# Patient Record
Sex: Female | Born: 1940 | Race: White | Hispanic: No | Smoking: Never smoker
Health system: Southern US, Community
[De-identification: ages and names within clinical notes are randomized; demographics above are authoritative.]

## PROBLEM LIST (undated history)

## (undated) DIAGNOSIS — K219 Gastro-esophageal reflux disease without esophagitis: Secondary | ICD-10-CM

## (undated) DIAGNOSIS — M21619 Bunion of unspecified foot: Secondary | ICD-10-CM

## (undated) DIAGNOSIS — R413 Other amnesia: Secondary | ICD-10-CM

## (undated) DIAGNOSIS — K644 Residual hemorrhoidal skin tags: Secondary | ICD-10-CM

## (undated) DIAGNOSIS — M810 Age-related osteoporosis without current pathological fracture: Secondary | ICD-10-CM

## (undated) DIAGNOSIS — I10 Essential (primary) hypertension: Secondary | ICD-10-CM

## (undated) DIAGNOSIS — D126 Benign neoplasm of colon, unspecified: Secondary | ICD-10-CM

## (undated) DIAGNOSIS — G43009 Migraine without aura, not intractable, without status migrainosus: Secondary | ICD-10-CM

## (undated) DIAGNOSIS — T7840XA Allergy, unspecified, initial encounter: Secondary | ICD-10-CM

## (undated) DIAGNOSIS — H04129 Dry eye syndrome of unspecified lacrimal gland: Secondary | ICD-10-CM

## (undated) DIAGNOSIS — F329 Major depressive disorder, single episode, unspecified: Secondary | ICD-10-CM

## (undated) DIAGNOSIS — E785 Hyperlipidemia, unspecified: Secondary | ICD-10-CM

## (undated) HISTORY — DX: Other amnesia: R41.3

## (undated) HISTORY — DX: Allergy, unspecified, initial encounter: T78.40XA

## (undated) HISTORY — DX: Benign neoplasm of colon, unspecified: D12.6

## (undated) HISTORY — DX: Age-related osteoporosis without current pathological fracture: M81.0

## (undated) HISTORY — DX: Major depressive disorder, single episode, unspecified: F32.9

## (undated) HISTORY — DX: Bunion of unspecified foot: M21.619

## (undated) HISTORY — DX: Migraine without aura, not intractable, without status migrainosus: G43.009

## (undated) HISTORY — DX: Essential (primary) hypertension: I10

## (undated) HISTORY — DX: Dry eye syndrome of unspecified lacrimal gland: H04.129

## (undated) HISTORY — DX: Gastro-esophageal reflux disease without esophagitis: K21.9

## (undated) HISTORY — DX: Residual hemorrhoidal skin tags: K64.4

## (undated) HISTORY — DX: Hyperlipidemia, unspecified: E78.5

---

## 1978-08-02 HISTORY — PX: VAGINAL HYSTERECTOMY: SUR661

## 1998-02-21 ENCOUNTER — Other Ambulatory Visit: Admission: RE | Admit: 1998-02-21 | Discharge: 1998-02-21 | Payer: Self-pay | Admitting: Family Medicine

## 1998-05-15 ENCOUNTER — Ambulatory Visit (HOSPITAL_COMMUNITY): Admission: RE | Admit: 1998-05-15 | Discharge: 1998-05-15 | Payer: Self-pay | Admitting: Obstetrics & Gynecology

## 1998-06-19 ENCOUNTER — Other Ambulatory Visit: Admission: RE | Admit: 1998-06-19 | Discharge: 1998-06-19 | Payer: Self-pay | Admitting: *Deleted

## 1998-08-02 DIAGNOSIS — K219 Gastro-esophageal reflux disease without esophagitis: Secondary | ICD-10-CM

## 1998-08-02 HISTORY — DX: Gastro-esophageal reflux disease without esophagitis: K21.9

## 1999-03-24 DIAGNOSIS — K644 Residual hemorrhoidal skin tags: Secondary | ICD-10-CM

## 1999-03-24 DIAGNOSIS — G43009 Migraine without aura, not intractable, without status migrainosus: Secondary | ICD-10-CM

## 1999-03-24 DIAGNOSIS — H04129 Dry eye syndrome of unspecified lacrimal gland: Secondary | ICD-10-CM

## 1999-03-24 HISTORY — DX: Residual hemorrhoidal skin tags: K64.4

## 1999-03-24 HISTORY — DX: Migraine without aura, not intractable, without status migrainosus: G43.009

## 1999-03-24 HISTORY — DX: Dry eye syndrome of unspecified lacrimal gland: H04.129

## 2000-05-24 ENCOUNTER — Other Ambulatory Visit: Admission: RE | Admit: 2000-05-24 | Discharge: 2000-05-24 | Payer: Self-pay | Admitting: Internal Medicine

## 2003-04-15 DIAGNOSIS — R413 Other amnesia: Secondary | ICD-10-CM

## 2003-04-15 HISTORY — DX: Other amnesia: R41.3

## 2004-04-15 DIAGNOSIS — M810 Age-related osteoporosis without current pathological fracture: Secondary | ICD-10-CM

## 2004-04-15 HISTORY — DX: Age-related osteoporosis without current pathological fracture: M81.0

## 2004-05-21 ENCOUNTER — Other Ambulatory Visit: Admission: RE | Admit: 2004-05-21 | Discharge: 2004-05-21 | Payer: Self-pay | Admitting: Gynecology

## 2005-08-17 ENCOUNTER — Other Ambulatory Visit: Admission: RE | Admit: 2005-08-17 | Discharge: 2005-08-17 | Payer: Self-pay | Admitting: Gynecology

## 2006-06-29 DIAGNOSIS — E785 Hyperlipidemia, unspecified: Secondary | ICD-10-CM

## 2006-06-29 HISTORY — DX: Hyperlipidemia, unspecified: E78.5

## 2006-08-25 ENCOUNTER — Other Ambulatory Visit: Admission: RE | Admit: 2006-08-25 | Discharge: 2006-08-25 | Payer: Self-pay | Admitting: Gynecology

## 2007-05-05 DIAGNOSIS — F32A Depression, unspecified: Secondary | ICD-10-CM | POA: Insufficient documentation

## 2007-05-05 HISTORY — DX: Depression, unspecified: F32.A

## 2007-06-16 ENCOUNTER — Encounter (INDEPENDENT_AMBULATORY_CARE_PROVIDER_SITE_OTHER): Payer: Self-pay | Admitting: *Deleted

## 2007-06-16 ENCOUNTER — Ambulatory Visit (HOSPITAL_COMMUNITY): Admission: RE | Admit: 2007-06-16 | Discharge: 2007-06-16 | Payer: Self-pay | Admitting: *Deleted

## 2007-06-16 HISTORY — PX: COLONOSCOPY: SHX174

## 2007-11-22 DIAGNOSIS — I10 Essential (primary) hypertension: Secondary | ICD-10-CM

## 2007-11-22 HISTORY — DX: Essential (primary) hypertension: I10

## 2008-08-02 HISTORY — PX: EYE SURGERY: SHX253

## 2009-02-13 LAB — HM DEXA SCAN

## 2009-10-15 ENCOUNTER — Encounter (INDEPENDENT_AMBULATORY_CARE_PROVIDER_SITE_OTHER): Payer: Self-pay | Admitting: *Deleted

## 2010-02-19 ENCOUNTER — Encounter (INDEPENDENT_AMBULATORY_CARE_PROVIDER_SITE_OTHER): Payer: Self-pay | Admitting: *Deleted

## 2010-04-27 ENCOUNTER — Ambulatory Visit: Payer: Self-pay | Admitting: Gastroenterology

## 2010-04-27 DIAGNOSIS — Z860100 Personal history of colon polyps, unspecified: Secondary | ICD-10-CM | POA: Insufficient documentation

## 2010-04-27 DIAGNOSIS — J301 Allergic rhinitis due to pollen: Secondary | ICD-10-CM | POA: Insufficient documentation

## 2010-07-01 DIAGNOSIS — M21619 Bunion of unspecified foot: Secondary | ICD-10-CM

## 2010-07-01 HISTORY — DX: Bunion of unspecified foot: M21.619

## 2010-09-01 NOTE — Letter (Signed)
Summary: New Patient letter  [facility]  [address] [place], [place] [address]   Phone: [phone]  Fax: [phone]       02/19/2010 MRN: [id number]  Caroline Snyder [address] [place], [place]  [address]  Dear Ms. Driggers,  Welcome to the Gastroenterology Division at [facility].    You are scheduled to see Dr. Arlyce Dice on 04/27/2010 at 10:00AM on the 3rd floor at [facility], [address]  We ask that you try to arrive at our office 15 minutes prior to your appointment time to allow for check-in.  We would like you to complete the enclosed self-administered evaluation form prior to your visit and bring it with you on the day of your appointment.  We will review it with you.  Also, please bring a complete list of all your medications or, if you prefer, bring the medication bottles and we will list them.  Please bring your insurance card so that we may make a copy of it.  If your insurance requires a referral to see a specialist, please bring your referral form from your primary care physician.  Co-payments are due at the time of your visit and may be paid by cash, check or credit card.     Your office visit will consist of a consult with your physician (includes a physical exam), any laboratory testing he/she may order, scheduling of any necessary diagnostic testing (e.g. x-ray, ultrasound, CT-scan), and scheduling of a procedure (e.g. Endoscopy, Colonoscopy) if required.  Please allow enough time on your schedule to allow for any/all of these possibilities.    If you cannot keep your appointment, please call [phone] to cancel or reschedule prior to your appointment date.  This allows [place] the opportunity to schedule an appointment for another patient in need of care.  If you do not cancel or reschedule by 5 p.m. the business day prior to your appointment date, you will be charged a $50.00 late cancellation/no-show fee.    Thank you for  choosing [facility] for your medical needs.  We appreciate the opportunity to care for you.  Please visit [place] at our website  to learn more about our practice.                     Sincerely,                                                             The Gastroenterology Division

## 2010-09-01 NOTE — Letter (Signed)
Summary: New Patient letter  [facility]enter[facility]  [address] [place], [place] [address]   Phone: [phone]  Fax: [phone]       10/15/2009 MRN: [id number]  Caroline Snyder [address] [place], [place]  [address]  Dear Caroline Snyder,  Welcome to the Gastroenterology Division at [facility].    You are scheduled to see Dr.  Arlyce Dice on 11/17/2009 at 11:30am on the 3rd floor at [facility], [address].  We ask that you try to arrive at our office 15 minutes prior to your appointment time to allow for check-in.  We would like you to complete the enclosed self-administered evaluation form prior to your visit and bring it with you on the day of your appointment.  We will review it with you.  Also, please bring a complete list of all your medications or, if you prefer, bring the medication bottles and we will list them.  Please bring your insurance card so that we may make a copy of it.  If your insurance requires a referral to see a specialist, please bring your referral form from your primary care physician.  Co-payments are due at the time of your visit and may be paid by cash, check or credit card.     Your office visit will consist of a consult with your physician (includes a physical exam), any laboratory testing he/she may order, scheduling of any necessary diagnostic testing (e.g. x-ray, ultrasound, CT-scan), and scheduling of a procedure (e.g. Endoscopy, Colonoscopy) if required.  Please allow enough time on your schedule to allow for any/all of these possibilities.    If you cannot keep your appointment, please call [phone] to cancel or reschedule prior to your appointment date.  This allows [place] the opportunity to schedule an appointment for another patient in need of care.  If you do not cancel or reschedule by 5 p.m. the business day prior to your appointment date, you will be charged a $50.00 late cancellation/no-show fee.    Thank you for  choosing [facility] for your medical needs.  We appreciate the opportunity to care for you.  Please visit [place] at our website  to learn more about our practice.                     Sincerely,                                                             The Gastroenterology Division

## 2010-09-01 NOTE — Assessment & Plan Note (Signed)
Summary: CLEARING THROAT...AS.   History of Present Illness Visit Type: new patient  Primary GI MD: Melvia Heaps MD [facility] Primary Provider: Lenon Curt. Chilton Si, MD  Requesting Provider: na Chief Complaint: Bloating, GERD, and IBS  History of Present Illness:   [name] is a pleasant 70 year old white female referred at the request of Dr. Chilton Si  because of history of colon polyps.  Screening colonoscopy in 2008 demonstrated an adenomatous polyp.  She has rare pyrosis.  Her main complaint is scratchy throat and sinus headaches.  She is taking Allegra without relief.  She denies dysphagia or persistent pyrosis.   GI Review of Systems    Reports acid reflux, bloating, and  heartburn.      Denies abdominal pain, belching, chest pain, dysphagia with liquids, dysphagia with solids, loss of appetite, nausea, vomiting, vomiting blood, weight loss, and  weight gain.      Reports irritable bowel syndrome.     Denies anal fissure, black tarry stools, change in bowel habit, constipation, diarrhea, diverticulosis, fecal incontinence, heme positive stool, hemorrhoids, jaundice, light color stool, liver problems, rectal bleeding, and  rectal pain.    Current Medications (verified): 1)  Pravastatin Sodium 40 Mg Tabs (Pravastatin Sodium) .... One Tablet By Mouth Once Daily 2)  Nizatidine 150 Mg Caps (Nizatidine) .... One Capsule By Mouth Once Daily As Needed 3)  Fexofenadine Hcl 180 Mg Tabs (Fexofenadine Hcl) .... One Tablet By Mouth Once Daily 4)  Trazodone Hcl 50 Mg Tabs (Trazodone Hcl) .Marland Kitchen.. 1-3 Tablets By Mouth At Bedtime 5)  Citracal/vitamin D 250-200 Mg-Unit Tabs (Calcium Citrate-Vitamin D) .... One Tablet By Mouth Once Daily 6)  Centrum Silver  Tabs (Multiple Vitamins-Minerals) .... One Tablet By Mouth Once Daily 7)  Cinnamon 500 Mg Tabs (Cinnamon) .... One By Mouth Two Times A Day 8)  Fish Oil Maximum Strength 1200 Mg Caps (Omega-3 Fatty Acids) .... One Capsule By Mouth Once Daily 9)  Vitamin D3  1000 Unit Caps (Cholecalciferol) .... One Capsule By Mouth Once Daily 10)  Vitamin B-12 1000 Mcg Tabs (Cyanocobalamin) .... One Tablet By Mouth Once Daily 11)  Vitamin E 400 Unit Caps (Vitamin E) .... One Capsule By Mouth Once Daily 12)  Bayer Low Strength 81 Mg Tbec (Aspirin) .... One Capsule By Mouth Once Daily 13)  Rolaids 550-110 Mg Chew (Ca Carbonate-Mag Hydroxide) .... As Needed 14)  Gas-X 80 Mg Chew (Simethicone) .... 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  Alcohol Use - no Daily Caffeine Use: 3 cups of coffee daily  Illicit Drug Use - no Smoking Status:  never Drug Use:  no  Review of Systems       The patient complains of allergy/sinus, back pain, headaches-new, night sweats, sleeping problems, sore throat, urination - excessive, urine leakage, and voice change.  The patient denies anemia, anxiety-new, arthritis/joint pain, blood in urine, breast changes/lumps, change in vision, confusion, cough, coughing up blood, depression-new, fainting, fatigue, fever, hearing problems, heart murmur,  heart rhythm changes, itching, menstrual pain, muscle pains/cramps, nosebleeds, pregnancy symptoms, shortness of breath, skin rash, swelling of feet/legs, swollen lymph glands, thirst - excessive , urination - excessive , urination changes/pain, and vision changes.          All other systems were reviewed and were negative   Vital Signs:  Patient profile:   70 year old female Height:      60 inches Weight:      106 pounds BMI:     20.78 BSA:     1.43 Pulse rate:   88 / minute Pulse rhythm:   regular BP sitting:   122 / 60  (left arm) Cuff size:   regular  Vitals Entered By: Ok Anis CMA (April 27, 2010 10:19 AM)  Physical Exam  Additional Exam:  She is a well-developed well-nourished female  skin: anicteric HEENT: normocephalic; PEERLA; no nasal or pharyngeal abnormalities neck: supple nodes: no cervical lymphadenopathy chest: clear to ausculatation and percussion heart: no murmurs, gallops, or rubs abd: soft, nontender; BS normoactive; no abdominal masses, tenderness, organomegaly rectal: deferred ext: no cynanosis, clubbing, edema skeletal: no deformities neuro: oriented x 3; no focal abnormalities    Impression & Recommendations:  Problem # 1:  PERSONAL HISTORY OF COLONIC POLYPS (ICD-V12.72) Plan followup colonoscopy in 2013  Problem # 2:  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 Chilton Si, MD  2)  Call back as needed  3)  The medication list was reviewed and reconciled.  All changed / newly prescribed medications were explained.  A complete medication list was provided to the patient / caregiver. Prescriptions: ZYRTEC ALLERGY 10 MG CAPS (CETIRIZINE HCL) take one tab daily as needed  #15 x 2   Entered and Authorized by:   Louis Meckel MD   Signed by:   Louis Meckel MD on 04/27/2010   Method used:   Electronically to        [facility] [address] [id number] (retail)       [address] [place], [place]  [address]       Ph: [phone]       Fax: [phone]   RxID:   [id number]

## 2010-09-01 NOTE — Letter (Signed)
Summary: Results Letter   Gastroenterology  [address] [place], [place] [address]   Phone: [phone]  Fax: [phone]        April 27, 2010 MRN: [id number]    WANZA SZUMSKI [address] [place], [place]  [address]    Dear Ms. Counsell,  It is my pleasure to have treated you recently as a new patient in my office. I appreciate your confidence and the opportunity to participate in your care.  Since I do have a busy inpatient endoscopy schedule and office schedule, my office hours vary weekly. I am, however, available for emergency calls everyday through my office. If I am not available for an urgent office appointment, another one of our gastroenterologist will be able to assist you.  My well-trained staff are prepared to help you at all times. For emergencies after office hours, a physician from our Gastroenterology section is always available through my 24 hour answering service  Once again I welcome you as a new patient and I look forward to a happy and healthy relationship             Sincerely,  Louis Meckel MD  This letter has been electronically signed by your physician.  Appended Document: Results Letter letter mailed

## 2010-09-01 NOTE — Op Note (Signed)
Summary: colonoscopy (Dr Virginia Rochester)  NAME:  Caroline Snyder, Caroline Snyder            ACCOUNT NO.:  [id number]      MEDICAL RECORD NO.:  [id number]          PATIENT TYPE:  AMB      LOCATION:  ENDO                         FACILITY:  [facility]      PHYSICIAN:  Georgiana Spinner, M.D.    DATE OF BIRTH:  12-23-1940      DATE OF PROCEDURE:  06/16/2007   DATE OF DISCHARGE:                                  OPERATIVE REPORT      PROCEDURE:  Colonoscopy.      ENDOSCOPIST:  Georgiana Spinner, M.D.      INDICATIONS:  Colon cancer screening, rectal bleeding.      ANESTHESIA:  Fentanyl 100 mcg, Versed 10 mg.      PROCEDURE:  Before administration of sedation, the patient was placed in   the left lateral decubitus position, and a rectal examination was   performed and she had a fairly poor rectal tone.  Subsequently, the   patient was sedated and the Pentax videoscopic colonoscope was inserted   in the rectum and passed through a tortuous sigmoid colon to reach the   cecum with pressure applied.  The cecum was identified by ileocecal   valve and appendiceal orifice, both of which were photographed.  From   this point, the colonoscope was slowly withdrawn, taking circumferential   views of the colonic mucosa, stopping at approximately 25 cm from anal   verge, at which point a polyp was seen, photographed and removed using   hot-biopsy-forceps technique, setting of 20/150 blended current.  The   endoscope was withdrawn to the rectum, which appeared normal on direct   and showed hemorrhoids on retroflexed view.  The endoscope was   straightened and withdrawn.  The patient's vital signs and pulse   oximetry remained stable.  The patient tolerated the procedure well   without apparent complications.      FINDINGS:   1. Internal hemorrhoids.   2. Laxity of the anal sphincter.   3. Tortuous sigmoid colon.   4. Polyp at 25 cm from anal verge.   5. Otherwise, an unremarkable exam.      PLAN:  Await biopsy report; the patient  will call me for results and   follow up with me as needed as an outpatient.                  ______________________________   Georgiana Spinner, M.D.            GMO/MEDQ  D:  06/16/2007  T:  06/17/2007  Job:  [id number]      cc:   Lenon Curt. Chilton Si, M.D.   Fax: [phone]

## 2010-12-15 NOTE — Op Note (Signed)
Caroline Snyder, Caroline Snyder            ACCOUNT NO.:  [id number]   MEDICAL RECORD NO.:  [id number]          PATIENT TYPE:  AMB   LOCATION:  ENDO                         FACILITY:  [facility]   PHYSICIAN:  Georgiana Spinner, M.D.    DATE OF BIRTH:  1940/10/11   DATE OF PROCEDURE:  06/16/2007  DATE OF DISCHARGE:                               OPERATIVE REPORT   PROCEDURE:  Colonoscopy.   ENDOSCOPIST:  Georgiana Spinner, M.D.   INDICATIONS:  Colon cancer screening, rectal bleeding.   ANESTHESIA:  Fentanyl 100 mcg, Versed 10 mg.   PROCEDURE:  Before administration of sedation, the patient was placed in  the left lateral decubitus position, and a rectal examination was  performed and she had a fairly poor rectal tone.  Subsequently, the  patient was sedated and the Pentax videoscopic colonoscope was inserted  in the rectum and passed through a tortuous sigmoid colon to reach the  cecum with pressure applied.  The cecum was identified by ileocecal  valve and appendiceal orifice, both of which were photographed.  From  this point, the colonoscope was slowly withdrawn, taking circumferential  views of the colonic mucosa, stopping at approximately 25 cm from anal  verge, at which point a polyp was seen, photographed and removed using  hot-biopsy-forceps technique, setting of 20/150 blended current.  The  endoscope was withdrawn to the rectum, which appeared normal on direct  and showed hemorrhoids on retroflexed view.  The endoscope was  straightened and withdrawn.  The patient's vital signs and pulse  oximetry remained stable.  The patient tolerated the procedure well  without apparent complications.   FINDINGS:  1. Internal hemorrhoids.  2. Laxity of the anal sphincter.  3. Tortuous sigmoid colon.  4. Polyp at 25 cm from anal verge.  5. Otherwise, an unremarkable exam.   PLAN:  Await biopsy report; the patient will call me for results and  follow up with me as needed as an outpatient.     ______________________________  Georgiana Spinner, M.D.     GMO/MEDQ  D:  06/16/2007  T:  06/17/2007  Job:  [id number]   cc:   Lenon Curt. Chilton Si, M.D.  Fax: [phone]

## 2011-05-20 LAB — HM MAMMOGRAPHY: HM Mammogram: NEGATIVE

## 2011-09-21 DIAGNOSIS — F3289 Other specified depressive episodes: Secondary | ICD-10-CM | POA: Diagnosis not present

## 2011-09-21 DIAGNOSIS — F329 Major depressive disorder, single episode, unspecified: Secondary | ICD-10-CM | POA: Diagnosis not present

## 2011-09-21 DIAGNOSIS — M21619 Bunion of unspecified foot: Secondary | ICD-10-CM | POA: Diagnosis not present

## 2011-09-21 DIAGNOSIS — E785 Hyperlipidemia, unspecified: Secondary | ICD-10-CM | POA: Diagnosis not present

## 2011-09-21 DIAGNOSIS — I10 Essential (primary) hypertension: Secondary | ICD-10-CM | POA: Diagnosis not present

## 2011-11-25 ENCOUNTER — Encounter: Payer: Self-pay | Admitting: Internal Medicine

## 2011-12-31 ENCOUNTER — Ambulatory Visit (AMBULATORY_SURGERY_CENTER): Payer: Medicare Other | Admitting: *Deleted

## 2011-12-31 VITALS — Ht 60.0 in | Wt 100.2 lb

## 2011-12-31 DIAGNOSIS — Z1211 Encounter for screening for malignant neoplasm of colon: Secondary | ICD-10-CM

## 2011-12-31 MED ORDER — PEG-KCL-NACL-NASULF-NA ASC-C 100 G PO SOLR: ORAL | Status: DC

## 2012-01-07 ENCOUNTER — Other Ambulatory Visit: Payer: Self-pay | Admitting: Dermatology

## 2012-01-07 DIAGNOSIS — L820 Inflamed seborrheic keratosis: Secondary | ICD-10-CM | POA: Diagnosis not present

## 2012-01-07 DIAGNOSIS — D1801 Hemangioma of skin and subcutaneous tissue: Secondary | ICD-10-CM | POA: Diagnosis not present

## 2012-01-07 DIAGNOSIS — D239 Other benign neoplasm of skin, unspecified: Secondary | ICD-10-CM | POA: Diagnosis not present

## 2012-01-07 DIAGNOSIS — L821 Other seborrheic keratosis: Secondary | ICD-10-CM | POA: Diagnosis not present

## 2012-01-14 ENCOUNTER — Encounter: Payer: Self-pay | Admitting: Internal Medicine

## 2012-01-28 ENCOUNTER — Encounter: Payer: Self-pay | Admitting: Internal Medicine

## 2012-02-10 ENCOUNTER — Telehealth: Payer: Self-pay | Admitting: Gastroenterology

## 2012-02-10 NOTE — Telephone Encounter (Signed)
Pt calling asking if she can take her axid if necessary for stomach problems due to her having a colonoscopy tomorrow. Pt instructed fine to take her meds as directed. Pt returned verbal understanding of instructions given. Pt told to avoid all red/purple fluids/pills. ewm

## 2012-02-11 ENCOUNTER — Ambulatory Visit (AMBULATORY_SURGERY_CENTER): Payer: Medicare Other | Admitting: Gastroenterology

## 2012-02-11 ENCOUNTER — Encounter: Payer: Self-pay | Admitting: Gastroenterology

## 2012-02-11 VITALS — BP 175/75 | HR 62 | Temp 98.0°F | Resp 18 | Ht 60.0 in | Wt 100.0 lb

## 2012-02-11 DIAGNOSIS — D126 Benign neoplasm of colon, unspecified: Secondary | ICD-10-CM

## 2012-02-11 DIAGNOSIS — Z1211 Encounter for screening for malignant neoplasm of colon: Secondary | ICD-10-CM

## 2012-02-11 DIAGNOSIS — K635 Polyp of colon: Secondary | ICD-10-CM

## 2012-02-11 DIAGNOSIS — F3289 Other specified depressive episodes: Secondary | ICD-10-CM | POA: Diagnosis not present

## 2012-02-11 DIAGNOSIS — F329 Major depressive disorder, single episode, unspecified: Secondary | ICD-10-CM | POA: Diagnosis not present

## 2012-02-11 DIAGNOSIS — E785 Hyperlipidemia, unspecified: Secondary | ICD-10-CM | POA: Diagnosis not present

## 2012-02-11 HISTORY — DX: Benign neoplasm of colon, unspecified: D12.6

## 2012-02-11 MED ORDER — SODIUM CHLORIDE 0.9 % IV SOLN: 500.0000 mL | INTRAVENOUS | Status: DC

## 2012-02-11 MED ORDER — HYDROCORTISONE ACETATE 25 MG RE SUPP: 25.0000 mg | Freq: Two times a day (BID) | RECTAL | Status: AC

## 2012-02-11 NOTE — Patient Instructions (Addendum)

## 2012-02-11 NOTE — Progress Notes (Signed)
Patient did not experience any of the following events: a burn prior to discharge; a fall within the facility; wrong site/side/patient/procedure/implant event; or a hospital transfer or hospital admission upon discharge from the facility. (G8907) Patient did not have preoperative order for IV antibiotic SSI prophylaxis. (G8918)  

## 2012-02-11 NOTE — Op Note (Addendum)
[facility] [address] [place], [place]  [address]  COLONOSCOPY PROCEDURE REPORT  PATIENT:  Caroline, Snyder  MR#:  [id number] BIRTHDATE:  22-Mar-1941, 71 yrs. old  GENDER:  female ENDOSCOPIST:  Barbette Hair. Arlyce Dice, MD REF. BY:  Murray Hodgkins, M.D. PROCEDURE DATE:  02/11/2012 PROCEDURE:  Colonoscopy with snare polypectomy ASA CLASS:  Class II INDICATIONS:  Screening, history of pre-cancerous (adenomatous) colon polyps MEDICATIONS:   MAC sedation, administered by CRNA propofol 200mg IV  DESCRIPTION OF PROCEDURE:   After the risks benefits and alternatives of the procedure were thoroughly explained, informed consent was obtained.  Digital rectal exam was performed and revealed external hemorrhoids.   The LB [id number] endoscope was introduced through the anus and advanced to the cecum, which was identified by both the appendix and ileocecal valve, without limitations.  The quality of the prep was excellent, using MiraLax.  The instrument was then slowly withdrawn as the colon was fully examined. <<PROCEDUREIMAGES>>  FINDINGS:  A sessile polyp was found in the ascending colon. It was 3 mm in size. Polyp was snared without cautery. Retrieval was successful (see image4). snare polyp  A sessile polyp was found in the sigmoid colon. It was 3 mm in size. It was found 10 cm from the point of entry. Polyp was snared without cautery. Retrieval was successful (see image7). snare polyp  Moderate diverticulosis was found in the sigmoid colon (see image1).  This was otherwise a normal examination of the colon (see image3 and image5). Retroflexed views in the rectum revealed no abnormalities.    The time to cecum =  1) 5.0  minutes. The scope was then withdrawn in 1) 10.75  minutes from the cecum and the procedure completed. COMPLICATIONS:  None ENDOSCOPIC IMPRESSION: 1) 3 mm sessile polyp in the ascending colon 2) 3 mm sessile polyp in the sigmoid colon 3) Moderate  diverticulosis in the sigmoid colon 4) Otherwise normal examination RECOMMENDATIONS: 1) If the polyp(s) removed today are proven to be adenomatous (pre-cancerous) polyps, you will need a repeat colonoscopy in 5 years. Otherwise you should continue to follow colorectal cancer screening guidelines for "routine risk" patients with colonoscopy in 10 years. You will receive a letter within 1-2 weeks with the results of your biopsy as well as final recommendations. Please call my office if you have not received a letter after 3 weeks. REPEAT EXAM:   You will receive a letter from Dr. Arlyce Dice in 1-2 weeks, after reviewing the final pathology, with followup recommendations.  ______________________________ Barbette Hair Arlyce Dice, MD  CC:  n. REVISED:  02/11/2012 11:47 AM eSIGNED:   Barbette Hair. Kaplan at 02/11/2012 11:47 AM  [employer], [id number]

## 2012-02-11 NOTE — Progress Notes (Signed)
Pt. Complains of large bleeding hemorrhoids while taking prep for colonoscopy.  She has several large hemorrhoids.  She asked if Dr. Arlyce Dice will Give her a prescription for suppositories.  Rx.for suppositories to be sent.

## 2012-02-14 ENCOUNTER — Telehealth: Payer: Self-pay | Admitting: *Deleted

## 2012-02-14 NOTE — Telephone Encounter (Signed)
NO ANSWER, MESSAGE LEFT FOR THE PATIENT. 

## 2012-02-21 ENCOUNTER — Encounter: Payer: Self-pay | Admitting: Gastroenterology

## 2012-03-17 DIAGNOSIS — I10 Essential (primary) hypertension: Secondary | ICD-10-CM | POA: Diagnosis not present

## 2012-03-17 DIAGNOSIS — R5381 Other malaise: Secondary | ICD-10-CM | POA: Diagnosis not present

## 2012-03-17 DIAGNOSIS — E785 Hyperlipidemia, unspecified: Secondary | ICD-10-CM | POA: Diagnosis not present

## 2012-03-22 DIAGNOSIS — E785 Hyperlipidemia, unspecified: Secondary | ICD-10-CM | POA: Diagnosis not present

## 2012-03-22 DIAGNOSIS — M542 Cervicalgia: Secondary | ICD-10-CM | POA: Diagnosis not present

## 2012-03-22 DIAGNOSIS — G4700 Insomnia, unspecified: Secondary | ICD-10-CM | POA: Diagnosis not present

## 2012-03-22 DIAGNOSIS — I10 Essential (primary) hypertension: Secondary | ICD-10-CM | POA: Diagnosis not present

## 2012-06-09 DIAGNOSIS — Z1231 Encounter for screening mammogram for malignant neoplasm of breast: Secondary | ICD-10-CM | POA: Diagnosis not present

## 2012-07-04 DIAGNOSIS — K3189 Other diseases of stomach and duodenum: Secondary | ICD-10-CM | POA: Diagnosis not present

## 2012-07-04 DIAGNOSIS — M542 Cervicalgia: Secondary | ICD-10-CM | POA: Diagnosis not present

## 2012-07-04 DIAGNOSIS — I10 Essential (primary) hypertension: Secondary | ICD-10-CM | POA: Diagnosis not present

## 2012-07-06 DIAGNOSIS — H353190 Nonexudative age-related macular degeneration, unspecified eye, stage unspecified: Secondary | ICD-10-CM | POA: Diagnosis not present

## 2012-07-13 DIAGNOSIS — E785 Hyperlipidemia, unspecified: Secondary | ICD-10-CM | POA: Diagnosis not present

## 2012-07-13 DIAGNOSIS — I10 Essential (primary) hypertension: Secondary | ICD-10-CM | POA: Diagnosis not present

## 2012-07-13 DIAGNOSIS — R5383 Other fatigue: Secondary | ICD-10-CM | POA: Diagnosis not present

## 2012-07-13 DIAGNOSIS — Z23 Encounter for immunization: Secondary | ICD-10-CM | POA: Diagnosis not present

## 2012-07-13 DIAGNOSIS — R5381 Other malaise: Secondary | ICD-10-CM | POA: Diagnosis not present

## 2012-07-13 DIAGNOSIS — Z79899 Other long term (current) drug therapy: Secondary | ICD-10-CM | POA: Diagnosis not present

## 2012-09-07 DIAGNOSIS — M1990 Unspecified osteoarthritis, unspecified site: Secondary | ICD-10-CM | POA: Diagnosis not present

## 2012-09-07 DIAGNOSIS — N8184 Pelvic muscle wasting: Secondary | ICD-10-CM | POA: Diagnosis not present

## 2012-09-22 DIAGNOSIS — H35359 Cystoid macular degeneration, unspecified eye: Secondary | ICD-10-CM | POA: Diagnosis not present

## 2012-09-22 DIAGNOSIS — H353190 Nonexudative age-related macular degeneration, unspecified eye, stage unspecified: Secondary | ICD-10-CM | POA: Diagnosis not present

## 2012-11-21 ENCOUNTER — Other Ambulatory Visit: Payer: Self-pay | Admitting: *Deleted

## 2012-11-21 MED ORDER — PRAVASTATIN SODIUM 40 MG PO TABS: 40.0000 mg | ORAL_TABLET | Freq: Every day | ORAL | Status: DC

## 2012-11-23 DIAGNOSIS — L820 Inflamed seborrheic keratosis: Secondary | ICD-10-CM | POA: Diagnosis not present

## 2013-01-03 ENCOUNTER — Encounter: Payer: Self-pay | Admitting: *Deleted

## 2013-01-03 ENCOUNTER — Ambulatory Visit: Payer: Self-pay | Admitting: Internal Medicine

## 2013-01-04 ENCOUNTER — Other Ambulatory Visit: Payer: Self-pay | Admitting: Dermatology

## 2013-01-04 DIAGNOSIS — D485 Neoplasm of uncertain behavior of skin: Secondary | ICD-10-CM | POA: Diagnosis not present

## 2013-01-04 DIAGNOSIS — L820 Inflamed seborrheic keratosis: Secondary | ICD-10-CM | POA: Diagnosis not present

## 2013-01-04 DIAGNOSIS — L988 Other specified disorders of the skin and subcutaneous tissue: Secondary | ICD-10-CM | POA: Diagnosis not present

## 2013-01-04 DIAGNOSIS — L723 Sebaceous cyst: Secondary | ICD-10-CM | POA: Diagnosis not present

## 2013-01-04 DIAGNOSIS — L819 Disorder of pigmentation, unspecified: Secondary | ICD-10-CM | POA: Diagnosis not present

## 2013-01-26 ENCOUNTER — Other Ambulatory Visit: Payer: Self-pay | Admitting: *Deleted

## 2013-01-26 ENCOUNTER — Encounter: Payer: Self-pay | Admitting: *Deleted

## 2013-01-26 ENCOUNTER — Other Ambulatory Visit: Payer: Medicare Other

## 2013-01-26 DIAGNOSIS — I10 Essential (primary) hypertension: Secondary | ICD-10-CM

## 2013-01-26 DIAGNOSIS — E785 Hyperlipidemia, unspecified: Secondary | ICD-10-CM | POA: Diagnosis not present

## 2013-01-27 LAB — COMPREHENSIVE METABOLIC PANEL
AST: 21 IU/L (ref 0–40)
Albumin: 4.6 g/dL (ref 3.5–4.8)
Alkaline Phosphatase: 50 IU/L (ref 39–117)
BUN/Creatinine Ratio: 22 (ref 11–26)
BUN: 19 mg/dL (ref 8–27)
CO2: 25 mmol/L (ref 18–29)
Calcium: 9.3 mg/dL (ref 8.6–10.2)
Chloride: 103 mmol/L (ref 97–108)
Glucose: 91 mg/dL (ref 65–99)

## 2013-01-27 LAB — LIPID PANEL
Cholesterol, Total: 226 mg/dL — ABNORMAL HIGH (ref 100–199)
HDL: 85 mg/dL (ref >39)
LDL Calculated: 119 mg/dL — ABNORMAL HIGH (ref 0–99)
Triglycerides: 111 mg/dL (ref 0–149)

## 2013-01-29 ENCOUNTER — Other Ambulatory Visit: Payer: Self-pay

## 2013-01-31 ENCOUNTER — Encounter: Payer: Self-pay | Admitting: Internal Medicine

## 2013-01-31 ENCOUNTER — Ambulatory Visit (INDEPENDENT_AMBULATORY_CARE_PROVIDER_SITE_OTHER): Payer: Medicare Other | Admitting: Internal Medicine

## 2013-01-31 VITALS — BP 142/72 | HR 98 | Temp 98.2°F | Resp 12 | Ht 60.0 in | Wt 99.0 lb

## 2013-01-31 DIAGNOSIS — I10 Essential (primary) hypertension: Secondary | ICD-10-CM | POA: Diagnosis not present

## 2013-01-31 DIAGNOSIS — K219 Gastro-esophageal reflux disease without esophagitis: Secondary | ICD-10-CM | POA: Diagnosis not present

## 2013-01-31 DIAGNOSIS — F329 Major depressive disorder, single episode, unspecified: Secondary | ICD-10-CM

## 2013-01-31 DIAGNOSIS — F339 Major depressive disorder, recurrent, unspecified: Secondary | ICD-10-CM | POA: Insufficient documentation

## 2013-01-31 DIAGNOSIS — E785 Hyperlipidemia, unspecified: Secondary | ICD-10-CM

## 2013-01-31 DIAGNOSIS — F32A Depression, unspecified: Secondary | ICD-10-CM

## 2013-01-31 DIAGNOSIS — F3289 Other specified depressive episodes: Secondary | ICD-10-CM | POA: Diagnosis not present

## 2013-01-31 NOTE — Patient Instructions (Signed)
Continue current medication.

## 2013-01-31 NOTE — Progress Notes (Signed)
Subjective:    Patient ID: Caroline Snyder, female    DOB: [birth year], 72 y.o.   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   Hyperlipidemia: Currently taking pravastatin 40 mg daily. LDL was 117. I think this is adequately controlled.  Unspecified essential hypertension: Controlled  Depression: Still said some days. She really doesn't have a lot to forward to. She does still going dancing.: Asymptomatic  GERD (gastroesophageal reflux disease)  Current Outpatient Prescriptions on File Prior to Visit  Medication Sig Dispense Refill  . acyclovir (ZOVIRAX) 200 MG capsule Take 200 mg by mouth as needed. Take 1-2 tablets as needed.      . ALPRAZolam (XANAX) 0.25 MG tablet Take 0.5 mg by mouth at bedtime as needed. Take one at night as needed for rest.      . Ascorbic Acid (VITAMIN C PO) Take by mouth daily.      Marland Kitchen aspirin 81 MG tablet Take 81 mg by mouth daily. Take 1 tablet daily to prevent heart attack, stroke and clotting.      . Calcium Carbonate-Vitamin D (CALCIUM + D PO) Take by mouth daily. Take 1 tablet daily for bone and muscle health.      . Cholecalciferol (VITAMIN D PO) Take by mouth daily. Take 1 tablet once daily for vitamin d supplement.      . fexofenadine-pseudoephedrine (ALLEGRA-D 24) 180-240 MG per 24 hr tablet Take 1 tablet by mouth daily. Take one tablet daily for allergies      . fish oil-omega-3 fatty acids 1000 MG capsule Take 1 g by mouth daily. Take 1 tablet daily for vitamin e supplement.      . Multiple Vitamin (MULTIVITAMIN) tablet Take 1 tablet by mouth daily.      . naproxen sodium (ANAPROX) 220 MG tablet Take 220 mg by mouth 2 (two) times daily with a meal. Take 1-2 tablets three times daily as needed for pain      . nizatidine (AXID) 150 MG capsule Take 150 mg by mouth 2 (two) times daily. Take one tablet twice daily as needed to help stomach.      . pravastatin (PRAVACHOL) 40 MG tablet Take 1 tablet (40 mg total) by mouth daily.  30  tablet  5  . VITAMIN E PO Take by mouth daily.                Review of Systems  Constitutional: Positive for fatigue.  HENT: Negative.   Eyes: Negative.   Respiratory: Negative.   Cardiovascular: Positive for palpitations.  Gastrointestinal: Negative.   Genitourinary:       History of stress incontinence. Some increase in frequency. Denies nocturia or dysuria.  Musculoskeletal: Negative.   Skin: Negative.   Neurological: Negative.   Hematological: Negative.   Psychiatric/Behavioral:       Some sadness.       Objective:BP 142/72  Pulse 98  Temp(Src) 98.2 F (36.8 C) (Oral)  Resp 12  Ht 5' (1.524 m)  Wt 99 lb (44.906 kg)  BMI 19.33 kg/m2  SpO2 91%    Physical Exam  Constitutional: She is oriented to person, place, and time. She appears well-developed and well-nourished. No distress.  HENT:  Right Ear: External ear normal.  Left Ear: External ear normal.  Nose: Nose normal.  Mouth/Throat: Oropharynx is clear and moist.  Eyes: Conjunctivae are normal. Pupils are equal, round, and reactive to light. Right eye exhibits no discharge. Left eye exhibits no discharge.  No scleral icterus.  Corrective lenses  Neck: Normal range of motion. Neck supple. No JVD present. No tracheal deviation present. No thyromegaly present.  Cardiovascular: Normal rate, regular rhythm, normal heart sounds and intact distal pulses.  Exam reveals no gallop and no friction rub.   No murmur heard. Pulmonary/Chest: Effort normal and breath sounds normal. No respiratory distress. She has no wheezes. She has no rales. She exhibits no tenderness.  Abdominal: Soft. Bowel sounds are normal. She exhibits no distension and no mass. There is no tenderness. There is no rebound.  Musculoskeletal: Normal range of motion. She exhibits no edema and no tenderness.  Bilateral bunions  Lymphadenopathy:    She has no cervical adenopathy.  Neurological: She is alert and oriented to person, place, and time. No  cranial nerve deficit. Coordination normal.  Skin: No rash noted. No erythema. No pallor.  Psychiatric: She has a normal mood and affect. Judgment and thought content normal.   Appointment on 01/26/2013  Component Date Value Range Status  . Glucose 01/26/2013 91  65 - 99 mg/dL Final  . BUN 40/98/1191 19  8 - 27 mg/dL Final  . Creatinine, Ser 01/26/2013 0.87  0.57 - 1.00 mg/dL Final  . GFR calc non Af Amer 01/26/2013 66  >59 mL/min/1.73 Final  . GFR calc Af Amer 01/26/2013 76  >59 mL/min/1.73 Final  . BUN/Creatinine Ratio 01/26/2013 22  11 - 26 Final  . Sodium 01/26/2013 142  134 - 144 mmol/L Final  . Potassium 01/26/2013 4.2  3.5 - 5.2 mmol/L Final  . Chloride 01/26/2013 103  97 - 108 mmol/L Final  . CO2 01/26/2013 25  18 - 29 mmol/L Final                 **Please note reference interval change**  . Calcium 01/26/2013 9.3  8.6 - 10.2 mg/dL Final  . Total Protein 01/26/2013 6.6  6.0 - 8.5 g/dL Final  . Albumin 47/82/9562 4.6  3.5 - 4.8 g/dL Final  . Globulin, Total 01/26/2013 2.0  1.5 - 4.5 g/dL Final  . Albumin/Globulin Ratio 01/26/2013 2.3  1.1 - 2.5 Final  . Total Bilirubin 01/26/2013 0.3  0.0 - 1.2 mg/dL Final  . Alkaline Phosphatase 01/26/2013 50  39 - 117 IU/L Final  . AST 01/26/2013 21  0 - 40 IU/L Final  . ALT 01/26/2013 20  0 - 32 IU/L Final  . Cholesterol, Total 01/26/2013 226* 100 - 199 mg/dL Final  . Triglycerides 01/26/2013 111  0 - 149 mg/dL Final  . HDL 13/03/6577 85  >39 mg/dL Final   Comment: According to ATP-III Guidelines, HDL-C >59 mg/dL is considered a                          negative risk factor for CHD.  Marland Kitchen VLDL Cholesterol Cal 01/26/2013 22  5 - 40 mg/dL Final  . LDL Calculated 01/26/2013 469* 0 - 99 mg/dL Final  . Chol/HDL Ratio 01/26/2013 2.7  0.0 - 4.4 ratio units Final       Assessment & Plan:  Hyperlipidemia: Continue current medication   - Plan: Lipid panel  Hypertension: Controlled on current medications  - Plan: Comprehensive metabolic  panel  Depression: No medications   - Plan: TSH  GERD (gastroesophageal reflux disease): Asymptomatic on current medication

## 2013-02-09 DIAGNOSIS — M79609 Pain in unspecified limb: Secondary | ICD-10-CM | POA: Diagnosis not present

## 2013-02-09 DIAGNOSIS — M2010 Hallux valgus (acquired), unspecified foot: Secondary | ICD-10-CM | POA: Diagnosis not present

## 2013-02-09 DIAGNOSIS — M779 Enthesopathy, unspecified: Secondary | ICD-10-CM | POA: Diagnosis not present

## 2013-02-09 DIAGNOSIS — M19079 Primary osteoarthritis, unspecified ankle and foot: Secondary | ICD-10-CM | POA: Diagnosis not present

## 2013-04-25 ENCOUNTER — Other Ambulatory Visit: Payer: Self-pay | Admitting: Internal Medicine

## 2013-05-25 DIAGNOSIS — L819 Disorder of pigmentation, unspecified: Secondary | ICD-10-CM | POA: Diagnosis not present

## 2013-05-25 DIAGNOSIS — L820 Inflamed seborrheic keratosis: Secondary | ICD-10-CM | POA: Diagnosis not present

## 2013-05-26 ENCOUNTER — Other Ambulatory Visit: Payer: Self-pay | Admitting: Internal Medicine

## 2013-06-12 ENCOUNTER — Other Ambulatory Visit: Payer: Self-pay | Admitting: Internal Medicine

## 2013-06-13 ENCOUNTER — Other Ambulatory Visit: Payer: Self-pay | Admitting: Nurse Practitioner

## 2013-07-07 ENCOUNTER — Other Ambulatory Visit: Payer: Self-pay | Admitting: Nurse Practitioner

## 2013-07-12 DIAGNOSIS — H353190 Nonexudative age-related macular degeneration, unspecified eye, stage unspecified: Secondary | ICD-10-CM | POA: Diagnosis not present

## 2013-07-12 DIAGNOSIS — H26499 Other secondary cataract, unspecified eye: Secondary | ICD-10-CM | POA: Diagnosis not present

## 2013-07-19 ENCOUNTER — Ambulatory Visit (INDEPENDENT_AMBULATORY_CARE_PROVIDER_SITE_OTHER): Payer: Medicare Other

## 2013-07-19 ENCOUNTER — Ambulatory Visit: Payer: Self-pay

## 2013-07-19 DIAGNOSIS — Z23 Encounter for immunization: Secondary | ICD-10-CM | POA: Diagnosis not present

## 2013-07-24 ENCOUNTER — Ambulatory Visit: Payer: Self-pay

## 2013-07-29 ENCOUNTER — Other Ambulatory Visit: Payer: Self-pay | Admitting: Internal Medicine

## 2013-08-04 DIAGNOSIS — R05 Cough: Secondary | ICD-10-CM | POA: Insufficient documentation

## 2013-08-04 DIAGNOSIS — R059 Cough, unspecified: Secondary | ICD-10-CM | POA: Insufficient documentation

## 2013-08-09 ENCOUNTER — Ambulatory Visit: Payer: Medicare Other | Admitting: Nurse Practitioner

## 2013-08-10 ENCOUNTER — Other Ambulatory Visit: Payer: Medicare Other

## 2013-08-14 ENCOUNTER — Encounter: Payer: Self-pay | Admitting: Internal Medicine

## 2013-08-14 ENCOUNTER — Ambulatory Visit: Payer: Medicare Other | Admitting: Internal Medicine

## 2013-08-16 DIAGNOSIS — H26499 Other secondary cataract, unspecified eye: Secondary | ICD-10-CM | POA: Diagnosis not present

## 2013-08-21 DIAGNOSIS — M542 Cervicalgia: Secondary | ICD-10-CM | POA: Insufficient documentation

## 2013-08-30 ENCOUNTER — Other Ambulatory Visit: Payer: Medicare Other

## 2013-08-30 DIAGNOSIS — I10 Essential (primary) hypertension: Secondary | ICD-10-CM | POA: Diagnosis not present

## 2013-08-30 DIAGNOSIS — H26499 Other secondary cataract, unspecified eye: Secondary | ICD-10-CM | POA: Diagnosis not present

## 2013-08-30 DIAGNOSIS — F32A Depression, unspecified: Secondary | ICD-10-CM

## 2013-08-30 DIAGNOSIS — F3289 Other specified depressive episodes: Secondary | ICD-10-CM | POA: Diagnosis not present

## 2013-08-30 DIAGNOSIS — E785 Hyperlipidemia, unspecified: Secondary | ICD-10-CM

## 2013-08-30 DIAGNOSIS — F329 Major depressive disorder, single episode, unspecified: Secondary | ICD-10-CM | POA: Diagnosis not present

## 2013-08-31 LAB — COMPREHENSIVE METABOLIC PANEL
ALT: 34 IU/L — ABNORMAL HIGH (ref 0–32)
AST: 28 IU/L (ref 0–40)
Albumin/Globulin Ratio: 2.3 (ref 1.1–2.5)
Albumin: 4.5 g/dL (ref 3.5–4.8)
Alkaline Phosphatase: 49 IU/L (ref 39–117)
BILIRUBIN TOTAL: 0.2 mg/dL (ref 0.0–1.2)
BUN / CREAT RATIO: 21 (ref 11–26)
BUN: 18 mg/dL (ref 8–27)
CHLORIDE: 101 mmol/L (ref 97–108)
CO2: 24 mmol/L (ref 18–29)
Calcium: 9.7 mg/dL (ref 8.7–10.3)
Creatinine, Ser: 0.87 mg/dL (ref 0.57–1.00)
GFR calc non Af Amer: 66 mL/min/{1.73_m2} (ref >59)
GFR, EST AFRICAN AMERICAN: 76 mL/min/{1.73_m2} (ref >59)
Globulin, Total: 2 g/dL (ref 1.5–4.5)
Glucose: 93 mg/dL (ref 65–99)
Potassium: 4.4 mmol/L (ref 3.5–5.2)
SODIUM: 141 mmol/L (ref 134–144)
Total Protein: 6.5 g/dL (ref 6.0–8.5)

## 2013-08-31 LAB — LIPID PANEL
CHOLESTEROL TOTAL: 254 mg/dL — AB (ref 100–199)
Chol/HDL Ratio: 2.8 ratio units (ref 0.0–4.4)
HDL: 92 mg/dL (ref >39)
LDL CALC: 142 mg/dL — AB (ref 0–99)
TRIGLYCERIDES: 99 mg/dL (ref 0–149)
VLDL CHOLESTEROL CAL: 20 mg/dL (ref 5–40)

## 2013-08-31 LAB — TSH: TSH: 3.25 u[IU]/mL (ref 0.450–4.500)

## 2013-09-04 ENCOUNTER — Encounter: Payer: Self-pay | Admitting: Internal Medicine

## 2013-09-04 ENCOUNTER — Ambulatory Visit (INDEPENDENT_AMBULATORY_CARE_PROVIDER_SITE_OTHER): Payer: Medicare Other | Admitting: Internal Medicine

## 2013-09-04 VITALS — BP 130/70 | HR 66 | Temp 98.6°F | Resp 10 | Wt 98.6 lb

## 2013-09-04 DIAGNOSIS — I10 Essential (primary) hypertension: Secondary | ICD-10-CM | POA: Diagnosis not present

## 2013-09-04 DIAGNOSIS — K219 Gastro-esophageal reflux disease without esophagitis: Secondary | ICD-10-CM

## 2013-09-04 DIAGNOSIS — Z283 Underimmunization status: Secondary | ICD-10-CM

## 2013-09-04 DIAGNOSIS — M542 Cervicalgia: Secondary | ICD-10-CM

## 2013-09-04 DIAGNOSIS — R05 Cough: Secondary | ICD-10-CM

## 2013-09-04 DIAGNOSIS — E785 Hyperlipidemia, unspecified: Secondary | ICD-10-CM

## 2013-09-04 DIAGNOSIS — R059 Cough, unspecified: Secondary | ICD-10-CM

## 2013-09-04 DIAGNOSIS — Z2839 Other underimmunization status: Secondary | ICD-10-CM

## 2013-09-04 MED ORDER — TETANUS-DIPHTH-ACELL PERTUSSIS 5-2.5-18.5 LF-MCG/0.5 IM SUSP: 0.5000 mL | Freq: Once | INTRAMUSCULAR | Status: DC

## 2013-09-04 NOTE — Progress Notes (Signed)
Patient ID: Caroline Snyder, female   DOB: 08/07/1940, 73 y.o.   MRN: [id number]    Location:    [facility]  Place of Service:  OFFICE    Allergies  Allergen Reactions  . Vioxx [Rofecoxib] Nausea Only    Chief Complaint  Patient presents with  . Medical Managment of Chronic Issues    6 month follow-up, discuss labs (copy printed)  . URI    Productive cough x 1 month or longer   . Referral    GYN referrral, Dr.Lomax retired   . Neck Pain    Lower neck pain (rigth side) off/on x several months     HPI:  Unspecified essential hypertension: controlled  Hyperlipidemia; satisfactory control Runs a very high HDL  GERD (gastroesophageal reflux disease): controlled  Cough: mostly a dry cough, but produces some sputum  Cervicalgia: persistent. Not doing exeercises    Medications: Patient's Medications  New Prescriptions   No medications on file  Previous Medications   ACYCLOVIR (ZOVIRAX) 200 MG CAPSULE    TAKE 1-2 CAPSULES BY MOUTH AS NEEDED   ALPRAZOLAM (XANAX) 0.5 MG TABLET    TAKE 1 TABLET BY MOUTH EVERY NIGHT AT BEDTIME AS NEEDED FOR REST   ASCORBIC ACID (VITAMIN C PO)    Take by mouth daily.   ASPIRIN 81 MG TABLET    Take 81 mg by mouth daily. Take 1 tablet daily to prevent heart attack, stroke and clotting.   CALCIUM CARBONATE-VITAMIN D (CALCIUM + D PO)    Take by mouth daily. Take 2 tablet daily for bone and muscle health.   CHOLECALCIFEROL (VITAMIN D PO)    Take by mouth daily. Take 1 tablet once daily for vitamin d supplement.   FEXOFENADINE-PSEUDOEPHEDRINE (ALLEGRA-D 24) 180-240 MG PER 24 HR TABLET    Take 1 tablet by mouth daily. Take one tablet daily for allergies   FISH OIL-OMEGA-3 FATTY ACIDS 1000 MG CAPSULE    Take 1 g by mouth daily. Take 1 tablet daily for vitamin e supplement.   MULTIPLE VITAMIN (MULTIVITAMIN) TABLET    Take 1 tablet by mouth daily.   NAPROXEN SODIUM (ANAPROX) 220 MG TABLET    Take 220 mg by mouth 2 (two) times daily with a meal. Take 1-2  tablets three times daily as needed for pain   NIZATIDINE (AXID) 150 MG CAPSULE    Take 150 mg by mouth 2 (two) times daily. Take one tablet twice daily as needed to help stomach.   PRAVASTATIN (PRAVACHOL) 40 MG TABLET    TAKE 1 TABLET BY MOUTH EVERY DAY   TDAP (BOOSTRIX) 5-2.5-18.5 LF-MCG/0.5 INJECTION    Inject 0.5 mLs into the muscle once.   VITAMIN E PO    Take by mouth daily.  Modified Medications   No medications on file  Discontinued Medications   ALPRAZOLAM (XANAX) 0.25 MG TABLET    Take 0.5 mg by mouth at bedtime as needed. Take one at night as needed for rest.     Review of Systems  Constitutional: Positive for fatigue and unexpected weight change (lost 8 pounds in the last 4 years.).  HENT: Negative.   Eyes: Negative.   Respiratory: Negative.   Cardiovascular: Positive for palpitations.  Gastrointestinal: Negative.   Genitourinary:       History of stress incontinence. Some increase in frequency. Denies nocturia or dysuria.  Musculoskeletal: Negative.   Skin: Negative.   Neurological: Negative.   Hematological: Negative.   Psychiatric/Behavioral:  Some sadness.    Filed Vitals:   09/04/13 1539  BP: 130/70  Pulse: 66  Temp: 98.6 F (37 C)  TempSrc: Oral  Resp: 10  Weight: 98 lb 9.6 oz (44.725 kg)  SpO2: 96%   Physical Exam  Constitutional: She is oriented to person, place, and time. She appears well-developed and well-nourished. No distress.  HENT:  Right Ear: External ear normal.  Left Ear: External ear normal.  Nose: Nose normal.  Mouth/Throat: Oropharynx is clear and moist.  Eyes: Conjunctivae are normal. Pupils are equal, round, and reactive to light. Right eye exhibits no discharge. Left eye exhibits no discharge. No scleral icterus.  Corrective lenses  Neck: Normal range of motion. Neck supple. No JVD present. No tracheal deviation present. No thyromegaly present.  Cardiovascular: Normal rate, regular rhythm, normal heart sounds and intact distal  pulses.  Exam reveals no gallop and no friction rub.   No murmur heard. Pulmonary/Chest: Effort normal and breath sounds normal. No respiratory distress. She has no wheezes. She has no rales. She exhibits no tenderness.  Abdominal: Soft. Bowel sounds are normal. She exhibits no distension and no mass. There is no tenderness. There is no rebound.  Musculoskeletal: Normal range of motion. She exhibits no edema and no tenderness.  Bilateral bunions  Lymphadenopathy:    She has no cervical adenopathy.  Neurological: She is alert and oriented to person, place, and time. No cranial nerve deficit. Coordination normal.  Skin: No rash noted. No erythema. No pallor.  Psychiatric: She has a normal mood and affect. Judgment and thought content normal.     Labs reviewed: Appointment on 08/30/2013  Component Date Value Range Status  . TSH 08/30/2013 3.250  0.450 - 4.500 uIU/mL Final  . Cholesterol, Total 08/30/2013 254* 100 - 199 mg/dL Final  . Triglycerides 08/30/2013 99  0 - 149 mg/dL Final  . HDL 08/30/2013 92  >39 mg/dL Final   Comment: According to ATP-III Guidelines, HDL-C >59 mg/dL is considered a                          negative risk factor for CHD.  Marland Kitchen VLDL Cholesterol Cal 08/30/2013 20  5 - 40 mg/dL Final  . LDL Calculated 08/30/2013 142* 0 - 99 mg/dL Final  . Chol/HDL Ratio 08/30/2013 2.8  0.0 - 4.4 ratio units Final   Comment:                                   T. Chol/HDL Ratio                                                                      Men  Women                                                        1/2 Avg.Risk  3.4    3.3  Avg.Risk  5.0    4.4                                                         2X Avg.Risk  9.6    7.1                                                         3X Avg.Risk 23.4   11.0  . Glucose 08/30/2013 93  65 - 99 mg/dL Final  . BUN 08/30/2013 18  8 - 27 mg/dL Final  . Creatinine, Ser 08/30/2013  0.87  0.57 - 1.00 mg/dL Final  . GFR calc non Af Amer 08/30/2013 66  >59 mL/min/1.73 Final  . GFR calc Af Amer 08/30/2013 76  >59 mL/min/1.73 Final  . BUN/Creatinine Ratio 08/30/2013 21  11 - 26 Final  . Sodium 08/30/2013 141  134 - 144 mmol/L Final  . Potassium 08/30/2013 4.4  3.5 - 5.2 mmol/L Final  . Chloride 08/30/2013 101  97 - 108 mmol/L Final  . CO2 08/30/2013 24  18 - 29 mmol/L Final  . Calcium 08/30/2013 9.7  8.7 - 10.3 mg/dL Final                 **Please note reference interval change**  . Total Protein 08/30/2013 6.5  6.0 - 8.5 g/dL Final  . Albumin 08/30/2013 4.5  3.5 - 4.8 g/dL Final  . Globulin, Total 08/30/2013 2.0  1.5 - 4.5 g/dL Final  . Albumin/Globulin Ratio 08/30/2013 2.3  1.1 - 2.5 Final  . Total Bilirubin 08/30/2013 0.2  0.0 - 1.2 mg/dL Final  . Alkaline Phosphatase 08/30/2013 49  39 - 117 IU/L Final  . AST 08/30/2013 28  0 - 40 IU/L Final  . ALT 08/30/2013 34* 0 - 32 IU/L Final  Abstract on 08/14/2013  Component Date Value Range Status  . HM Mammogram 05/20/2011 Negative   Final  . HM Dexa Scan 02/13/2009 Osteoporosis    Final      Assessment/Plan  Unspecified essential hypertension -controlled   Plan: CMP, EKG 12-Lead  Hyperlipidemia -satisfactory control   Plan: Lipid panel  GERD (gastroesophageal reflux disease): controlled  Cough: occasional sputum. Just observe for now  Cervicalgia: do exercises as previously instructed  Immunization deficiency - Plan: Tdap (BOOSTRIX) 5-2.5-18.5 LF-MCG/0.5 injection

## 2013-09-04 NOTE — Patient Instructions (Addendum)
Continue current medications. 

## 2013-09-20 ENCOUNTER — Encounter: Payer: Self-pay | Admitting: Internal Medicine

## 2013-09-21 DIAGNOSIS — L821 Other seborrheic keratosis: Secondary | ICD-10-CM | POA: Diagnosis not present

## 2013-10-18 ENCOUNTER — Other Ambulatory Visit: Payer: Self-pay | Admitting: Internal Medicine

## 2013-10-22 ENCOUNTER — Ambulatory Visit (INDEPENDENT_AMBULATORY_CARE_PROVIDER_SITE_OTHER): Payer: Medicare Other | Admitting: Family Medicine

## 2013-10-22 VITALS — BP 177/79 | HR 74 | Temp 97.8°F | Resp 18 | Ht 61.0 in | Wt 103.0 lb

## 2013-10-22 DIAGNOSIS — M545 Low back pain, unspecified: Secondary | ICD-10-CM

## 2013-10-22 DIAGNOSIS — N390 Urinary tract infection, site not specified: Secondary | ICD-10-CM | POA: Diagnosis not present

## 2013-10-22 LAB — POCT URINALYSIS DIPSTICK
Bilirubin, UA: NEGATIVE
GLUCOSE UA: NEGATIVE
Ketones, UA: NEGATIVE
Leukocytes, UA: NEGATIVE
Nitrite, UA: NEGATIVE
Protein, UA: NEGATIVE
Urobilinogen, UA: 0.2
pH, UA: 6.5

## 2013-10-22 LAB — POCT UA - MICROSCOPIC ONLY
Casts, Ur, LPF, POC: NEGATIVE
Crystals, Ur, HPF, POC: NEGATIVE
MUCUS UA: NEGATIVE
Yeast, UA: NEGATIVE

## 2013-10-22 MED ORDER — CIPROFLOXACIN HCL 500 MG PO TABS: 500.0000 mg | ORAL_TABLET | Freq: Two times a day (BID) | ORAL | Status: DC

## 2013-10-22 NOTE — Progress Notes (Signed)
Caroline Snyder is a 73 y.o. female who presents to [facility] today with complaints concerning for UTI:  1.  Concern for UTI: Caroline Snyder is a 73 y.o. female who complains of urinary frequency, urgency but no dysuria present for 1 day.  She denies any flank or back pain, fever, chills, vomiting, or abnormal vaginal discharge/itching/bleeding.  Does endorse some suprapubic pressure.  No history of incontinence.    States she felt "off" on Saturday, but has been feeling better since then.   The following portions of the patient's history were reviewed and updated as appropriate: allergies, current medications, past medical history, family and social history, and problem list.  Patient is a nonsmoker.    Past Medical History  Diagnosis Date  . Allergy   . Depression 05/05/2007  . Hyperlipidemia 06/29/2006  . 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 Bunion 07/01/2010  . Unspecified essential hypertension 11/22/2007  . Osteoporosis, unspecified 04/15/2004  . Memory loss 04/15/2003  . Migraine without aura, without mention of intractable migraine without mention of status migrainosus 03/24/1999  . 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 External hemorrhoids without mention of complication 44/09/4740  . GERD (gastroesophageal reflux disease) 2000   Past Surgical History  Procedure Laterality Date  . Vaginal hysterectomy  1980  . Eye surgery Bilateral 2010    cataract Dr. Bing Plume  . Colonoscopy  06-16-2007    Internal hemorrhoids,laxity of anal sphincter,polyp at 25cm form anal verge, tortuous sigmoid colon. Dr.Orr     Medications reviewed. Current Outpatient Prescriptions  Medication Sig Dispense Refill  . acyclovir (ZOVIRAX) 200 MG capsule TAKE 1-2 CAPSULES BY MOUTH AS NEEDED  30 capsule  3  . ALPRAZolam (XANAX) 0.5 MG tablet TAKE 1 TABLET BY MOUTH EVERY NIGHT AT BEDTIME AS NEEDED FOR REST  30 tablet  1  . Ascorbic Acid (VITAMIN C PO) Take by mouth daily.      Marland Kitchen aspirin 81  MG tablet Take 81 mg by mouth daily. Take 1 tablet daily to prevent heart attack, stroke and clotting.      . Calcium Carbonate-Vitamin D (CALCIUM + D PO) Take by mouth daily. Take 2 tablet daily for bone and muscle health.      . Cholecalciferol (VITAMIN D PO) Take by mouth daily. Take 1 tablet once daily for vitamin d supplement.      . fexofenadine-pseudoephedrine (ALLEGRA-D 24) 180-240 MG per 24 hr tablet Take 1 tablet by mouth daily. Take one tablet daily for allergies      . fish oil-omega-3 fatty acids 1000 MG capsule Take 1 g by mouth daily. Take 1 tablet daily for vitamin e supplement.      . Multiple Vitamin (MULTIVITAMIN) tablet Take 1 tablet by mouth daily.      . naproxen sodium (ANAPROX) 220 MG tablet Take 220 mg by mouth 2 (two) times daily with a meal. Take 1-2 tablets three times daily as needed for pain      . nizatidine (AXID) 150 MG capsule Take 150 mg by mouth 2 (two) times daily. Take one tablet twice daily as needed to help stomach.      . pravastatin (PRAVACHOL) 40 MG tablet TAKE 1 TABLET BY MOUTH EVERY DAY  90 tablet  0  . VITAMIN E PO Take by mouth daily.       No current facility-administered medications for this visit.    ROS as above otherwise neg.      Objective:   Physical Exam BP  177/79  Pulse 74  Temp(Src) 97.8 F (36.6 C) (Oral)  Resp 18  Ht 5\' 1"  (1.549 m)  Wt 103 lb (46.72 kg)  BMI 19.47 kg/m2  SpO2 98% Gen:  Alert, cooperative patient who appears stated age in no acute distress.  Vital signs reviewed. Mouth: MMM Cardiac:  Regular rate and rhythm without murmur auscultated.  Good S1/S2. Pulm:  Clear to auscultation bilaterally with good air movement.  No wheezes or rales noted.   Abdomen:  Soft/ND/NT.  No CVA tenderness bilaterally   Results for orders placed in visit on 10/22/13 (from the past 72 hour(s))  POCT URINALYSIS DIPSTICK     Status: None   Collection Time    10/22/13  8:50 PM      Result Value Ref Range   Color, UA yellow      Clarity, UA clear     Glucose, UA neg     Bilirubin, UA neg     Ketones, UA neg     Spec Grav, UA <=1.005     Blood, UA trace     pH, UA 6.5     Protein, UA neg     Urobilinogen, UA 0.2     Nitrite, UA neg     Leukocytes, UA Negative    POCT UA - MICROSCOPIC ONLY     Status: None   Collection Time    10/22/13  8:50 PM      Result Value Ref Range   WBC, Ur, HPF, POC 0-1     RBC, urine, microscopic 0-2     Bacteria, U Microscopic trace     Mucus, UA neg     Epithelial cells, urine per micros 0-1     Crystals, Ur, HPF, POC neg     Casts, Ur, LPF, POC neg     Yeast, UA neg      Imp/Plan: 1.  UTI: - treat with Cipro - send urine culture - return precautions noted.

## 2013-10-22 NOTE — Patient Instructions (Signed)
Take the Cipro twice daily for 7 days.  It was good to meet you!  Urinary Tract Infection Urinary tract infections (UTIs) can develop anywhere along your urinary tract. Your urinary tract is your body's drainage system for removing wastes and extra water. Your urinary tract includes two kidneys, two ureters, a bladder, and a urethra. Your kidneys are a pair of bean-shaped organs. Each kidney is about the size of your fist. They are located below your ribs, one on each side of your spine. CAUSES Infections are caused by microbes, which are microscopic organisms, including fungi, viruses, and bacteria. These organisms are so small that they can only be seen through a microscope. Bacteria are the microbes that most commonly cause UTIs. SYMPTOMS  Symptoms of UTIs may vary by age and gender of the patient and by the location of the infection. Symptoms in young women typically include a frequent and intense urge to urinate and a painful, burning feeling in the bladder or urethra during urination. Older women and men are more likely to be tired, shaky, and weak and have muscle aches and abdominal pain. A fever may mean the infection is in your kidneys. Other symptoms of a kidney infection include pain in your back or sides below the ribs, nausea, and vomiting. DIAGNOSIS To diagnose a UTI, your caregiver will ask you about your symptoms. Your caregiver also will ask to provide a urine sample. The urine sample will be tested for bacteria and white blood cells. White blood cells are made by your body to help fight infection. TREATMENT  Typically, UTIs can be treated with medication. Because most UTIs are caused by a bacterial infection, they usually can be treated with the use of antibiotics. The choice of antibiotic and length of treatment depend on your symptoms and the type of bacteria causing your infection. HOME CARE INSTRUCTIONS  If you were prescribed antibiotics, take them exactly as your caregiver  instructs you. Finish the medication even if you feel better after you have only taken some of the medication.  Drink enough water and fluids to keep your urine clear or pale yellow.  Avoid caffeine, tea, and carbonated beverages. They tend to irritate your bladder.  Empty your bladder often. Avoid holding urine for long periods of time.  Empty your bladder before and after sexual intercourse.  After a bowel movement, women should cleanse from front to back. Use each tissue only once. SEEK MEDICAL CARE IF:   You have back pain.  You develop a fever.  Your symptoms do not begin to resolve within 3 days. SEEK IMMEDIATE MEDICAL CARE IF:   You have severe back pain or lower abdominal pain.  You develop chills.  You have nausea or vomiting.  You have continued burning or discomfort with urination. MAKE SURE YOU:   Understand these instructions.  Will watch your condition.  Will get help right away if you are not doing well or get worse. Document Released: 04/28/2005 Document Revised: 01/18/2012 Document Reviewed: 08/27/2011 [facility] Patient Information 2014 [place].

## 2013-10-24 ENCOUNTER — Other Ambulatory Visit: Payer: Self-pay | Admitting: Nurse Practitioner

## 2013-10-25 LAB — URINE CULTURE: Colony Count: 50000

## 2013-11-16 ENCOUNTER — Ambulatory Visit: Payer: Medicare Other | Admitting: Gynecology

## 2013-11-22 ENCOUNTER — Ambulatory Visit: Payer: Medicare Other

## 2013-11-22 ENCOUNTER — Emergency Department (HOSPITAL_COMMUNITY): Payer: Medicare Other

## 2013-11-22 ENCOUNTER — Other Ambulatory Visit: Payer: Self-pay | Admitting: Internal Medicine

## 2013-11-22 ENCOUNTER — Encounter (HOSPITAL_COMMUNITY): Payer: Self-pay | Admitting: Emergency Medicine

## 2013-11-22 ENCOUNTER — Emergency Department (HOSPITAL_COMMUNITY)
Admission: EM | Admit: 2013-11-22 | Discharge: 2013-11-22 | Disposition: A | Discharge status: Home / Self Care | Payer: Medicare Other | Source: Home / Self Care | Attending: Emergency Medicine | Admitting: Emergency Medicine

## 2013-11-22 ENCOUNTER — Ambulatory Visit (INDEPENDENT_AMBULATORY_CARE_PROVIDER_SITE_OTHER): Payer: Medicare Other | Admitting: Emergency Medicine

## 2013-11-22 VITALS — BP 116/65 | HR 79 | Temp 98.0°F | Resp 18 | Wt 100.0 lb

## 2013-11-22 DIAGNOSIS — M545 Low back pain, unspecified: Secondary | ICD-10-CM | POA: Diagnosis not present

## 2013-11-22 DIAGNOSIS — R630 Anorexia: Secondary | ICD-10-CM | POA: Diagnosis present

## 2013-11-22 DIAGNOSIS — M549 Dorsalgia, unspecified: Secondary | ICD-10-CM | POA: Diagnosis not present

## 2013-11-22 DIAGNOSIS — R059 Cough, unspecified: Secondary | ICD-10-CM

## 2013-11-22 DIAGNOSIS — E871 Hypo-osmolality and hyponatremia: Secondary | ICD-10-CM | POA: Diagnosis present

## 2013-11-22 DIAGNOSIS — R52 Pain, unspecified: Secondary | ICD-10-CM | POA: Diagnosis not present

## 2013-11-22 DIAGNOSIS — R042 Hemoptysis: Secondary | ICD-10-CM | POA: Diagnosis not present

## 2013-11-22 DIAGNOSIS — K920 Hematemesis: Secondary | ICD-10-CM | POA: Diagnosis not present

## 2013-11-22 DIAGNOSIS — Z820 Family history of epilepsy and other diseases of the nervous system: Secondary | ICD-10-CM | POA: Diagnosis not present

## 2013-11-22 DIAGNOSIS — J189 Pneumonia, unspecified organism: Secondary | ICD-10-CM | POA: Diagnosis not present

## 2013-11-22 DIAGNOSIS — R599 Enlarged lymph nodes, unspecified: Secondary | ICD-10-CM | POA: Diagnosis not present

## 2013-11-22 DIAGNOSIS — Z86010 Personal history of colonic polyps: Secondary | ICD-10-CM | POA: Diagnosis not present

## 2013-11-22 DIAGNOSIS — R7401 Elevation of levels of liver transaminase levels: Secondary | ICD-10-CM | POA: Diagnosis present

## 2013-11-22 DIAGNOSIS — R05 Cough: Secondary | ICD-10-CM | POA: Diagnosis not present

## 2013-11-22 DIAGNOSIS — R112 Nausea with vomiting, unspecified: Secondary | ICD-10-CM | POA: Diagnosis present

## 2013-11-22 DIAGNOSIS — Z833 Family history of diabetes mellitus: Secondary | ICD-10-CM | POA: Diagnosis not present

## 2013-11-22 DIAGNOSIS — F3289 Other specified depressive episodes: Secondary | ICD-10-CM | POA: Diagnosis present

## 2013-11-22 DIAGNOSIS — Z8249 Family history of ischemic heart disease and other diseases of the circulatory system: Secondary | ICD-10-CM | POA: Diagnosis not present

## 2013-11-22 DIAGNOSIS — F329 Major depressive disorder, single episode, unspecified: Secondary | ICD-10-CM | POA: Diagnosis present

## 2013-11-22 DIAGNOSIS — Z823 Family history of stroke: Secondary | ICD-10-CM | POA: Diagnosis not present

## 2013-11-22 DIAGNOSIS — M810 Age-related osteoporosis without current pathological fracture: Secondary | ICD-10-CM | POA: Diagnosis present

## 2013-11-22 DIAGNOSIS — I10 Essential (primary) hypertension: Secondary | ICD-10-CM | POA: Diagnosis not present

## 2013-11-22 DIAGNOSIS — E785 Hyperlipidemia, unspecified: Secondary | ICD-10-CM | POA: Diagnosis present

## 2013-11-22 DIAGNOSIS — Z841 Family history of disorders of kidney and ureter: Secondary | ICD-10-CM | POA: Diagnosis not present

## 2013-11-22 DIAGNOSIS — K219 Gastro-esophageal reflux disease without esophagitis: Secondary | ICD-10-CM | POA: Diagnosis present

## 2013-11-22 DIAGNOSIS — J8410 Pulmonary fibrosis, unspecified: Secondary | ICD-10-CM | POA: Diagnosis not present

## 2013-11-22 DIAGNOSIS — Z6379 Other stressful life events affecting family and household: Secondary | ICD-10-CM | POA: Diagnosis not present

## 2013-11-22 DIAGNOSIS — R7402 Elevation of levels of lactic acid dehydrogenase (LDH): Secondary | ICD-10-CM | POA: Diagnosis present

## 2013-11-22 LAB — POCT CBC
Granulocyte percent: 84.2 %G — AB (ref 37–80)
HEMATOCRIT: 40.4 % (ref 37.7–47.9)
Hemoglobin: 12.7 g/dL (ref 12.2–16.2)
Lymph, poc: 0.8 (ref 0.6–3.4)
MCH, POC: 30.1 pg (ref 27–31.2)
MCHC: 31.4 g/dL — AB (ref 31.8–35.4)
MCV: 95.8 fL (ref 80–97)
MID (cbc): 0.3 (ref 0–0.9)
MPV: 9.7 fL (ref 0–99.8)
POC GRANULOCYTE: 5.9 (ref 2–6.9)
POC LYMPH PERCENT: 12.1 %L (ref 10–50)
POC MID %: 3.7 %M (ref 0–12)
Platelet Count, POC: 211 10*3/uL (ref 142–424)
RBC: 4.22 M/uL (ref 4.04–5.48)
RDW, POC: 13.8 %
WBC: 7 10*3/uL (ref 4.6–10.2)

## 2013-11-22 LAB — URINALYSIS, ROUTINE W REFLEX MICROSCOPIC
Bilirubin Urine: NEGATIVE
GLUCOSE, UA: NEGATIVE mg/dL
HGB URINE DIPSTICK: NEGATIVE
KETONES UR: NEGATIVE mg/dL
Nitrite: NEGATIVE
PROTEIN: NEGATIVE mg/dL
Specific Gravity, Urine: 1.021 (ref 1.005–1.030)
Urobilinogen, UA: 0.2 mg/dL (ref 0.0–1.0)
pH: 6.5 (ref 5.0–8.0)

## 2013-11-22 LAB — PROTIME-INR
INR: 1 (ref 0.00–1.49)
PROTHROMBIN TIME: 13 s (ref 11.6–15.2)

## 2013-11-22 LAB — BASIC METABOLIC PANEL
BUN: 15 mg/dL (ref 6–23)
CALCIUM: 10 mg/dL (ref 8.4–10.5)
CO2: 28 meq/L (ref 19–32)
CREATININE: 0.76 mg/dL (ref 0.50–1.10)
Chloride: 99 mEq/L (ref 96–112)
GFR calc Af Amer: >90 mL/min (ref >90)
GFR, EST NON AFRICAN AMERICAN: 81 mL/min — AB (ref >90)
Glucose, Bld: 121 mg/dL — ABNORMAL HIGH (ref 70–99)
Potassium: 4 mEq/L (ref 3.7–5.3)
Sodium: 139 mEq/L (ref 137–147)

## 2013-11-22 LAB — CBC
HCT: 39.2 % (ref 36.0–46.0)
Hemoglobin: 13.2 g/dL (ref 12.0–15.0)
MCH: 30.7 pg (ref 26.0–34.0)
MCHC: 33.7 g/dL (ref 30.0–36.0)
MCV: 91.2 fL (ref 78.0–100.0)
Platelets: 212 10*3/uL (ref 150–400)
RBC: 4.3 MIL/uL (ref 3.87–5.11)
RDW: 13.1 % (ref 11.5–15.5)
WBC: 6.2 10*3/uL (ref 4.0–10.5)

## 2013-11-22 LAB — HEPATIC FUNCTION PANEL
ALK PHOS: 63 U/L (ref 39–117)
ALT: 45 U/L — ABNORMAL HIGH (ref 0–35)
AST: 48 U/L — ABNORMAL HIGH (ref 0–37)
Albumin: 3.9 g/dL (ref 3.5–5.2)
Bilirubin, Direct: <0.2 mg/dL (ref 0.0–0.3)
TOTAL PROTEIN: 7.4 g/dL (ref 6.0–8.3)
Total Bilirubin: <0.2 mg/dL — ABNORMAL LOW (ref 0.3–1.2)

## 2013-11-22 LAB — D-DIMER, QUANTITATIVE: D-Dimer, Quant: 0.35 ug/mL-FEU (ref 0.00–0.48)

## 2013-11-22 LAB — URINE MICROSCOPIC-ADD ON

## 2013-11-22 LAB — APTT: aPTT: 33 seconds (ref 24–37)

## 2013-11-22 MED ORDER — HYDROCOD POLST-CHLORPHEN POLST 10-8 MG/5ML PO LQCR
5.0000 mL | Freq: Once | ORAL | Status: AC
  Administered 2013-11-22: 5 mL via ORAL
  Filled 2013-11-22: qty 5

## 2013-11-22 MED ORDER — IOHEXOL 350 MG/ML SOLN
100.0000 mL | Freq: Once | INTRAVENOUS | Status: AC | PRN
  Administered 2013-11-22: 100 mL via INTRAVENOUS

## 2013-11-22 MED ORDER — HYDROCOD POLST-CHLORPHEN POLST 10-8 MG/5ML PO LQCR: 5.0000 mL | Freq: Two times a day (BID) | ORAL | Status: DC | PRN

## 2013-11-22 MED ORDER — ACETAMINOPHEN 325 MG PO TABS
650.0000 mg | ORAL_TABLET | Freq: Once | ORAL | Status: AC
  Administered 2013-11-22: 650 mg via ORAL
  Filled 2013-11-22: qty 2

## 2013-11-22 MED ORDER — ACETAMINOPHEN 500 MG PO TABS: 500.0000 mg | ORAL_TABLET | ORAL | Status: DC | PRN

## 2013-11-22 MED ORDER — SODIUM CHLORIDE 0.9 % IV BOLUS (SEPSIS)
1000.0000 mL | Freq: Once | INTRAVENOUS | Status: AC
  Administered 2013-11-22: 1000 mL via INTRAVENOUS

## 2013-11-22 MED ORDER — HYDROCODONE-ACETAMINOPHEN 5-325 MG PO TABS
1.0000 | ORAL_TABLET | Freq: Once | ORAL | Status: DC
  Filled 2013-11-22: qty 1

## 2013-11-22 NOTE — ED Notes (Signed)
Patient transported to X-ray 

## 2013-11-22 NOTE — ED Notes (Addendum)
Per patient- started having hemoptysis and lower back pain that radiates down bilateral legs yesterday 4/22. Always has a hacking cough but says cough started worsening yesterday. Denies fevers and night sweats. Reports chills and headaches. Denies falling or any over exertion.   PCP called- per PCP they have D Dimer pending in office and recommending chest CT.

## 2013-11-22 NOTE — Patient Instructions (Addendum)
Patient's initial instruction said to go to the urologist which was incorrect.  This was corrected and patient was instructed to go to the emergency room at [facility] for evaluation

## 2013-11-22 NOTE — ED Provider Notes (Signed)
CSN: [id number]     Arrival date & time 11/22/13  1427 History   First MD Initiated Contact with Patient 11/22/13 1542     Chief Complaint  Patient presents with  . Hemoptysis  . Generalized Body Aches     (Consider location/radiation/quality/duration/timing/severity/associated sxs/prior Treatment) HPI 73 year old female presents with hemoptysis over the last couple days. She states she's been having "hacking cough" for several months it is intermittent. She denies any chest pain or shortness of breath. She went to urgent care and they referred her here for a chest CT. She denies any weight loss or smoking history. She states she also developed low back pain and upper leg pain bilaterally over last couple days. She's been taking Tylenol PM and Aleve with good success. She did not take any medicines today. No fevers or chills. No abdominal pain or urinary symptoms. She states she did have vaginal bleeding over the past month was scheduled to see a GYN tomorrow. She's also had intermittent bright red blood per them that she thinks is coming from hemorrhoids. This also been on for the past month. She states sometimes she has intermittently easy bruising as well. Denies any numbness or weakness in her legs. No trouble walking or bowel or bladder incontinence. No prior history of cancer.  Past Medical History  Diagnosis Date  . Allergy   . Depression 05/05/2007  . Hyperlipidemia 06/29/2006  . 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 Bunion 07/01/2010  . Unspecified essential hypertension 11/22/2007  . Osteoporosis, unspecified 04/15/2004  . Memory loss 04/15/2003  . Migraine without aura, without mention of intractable migraine without mention of status migrainosus 03/24/1999  . 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 External hemorrhoids without mention of complication [id number]  . GERD (gastroesophageal reflux disease) 2000   Past Surgical History  Procedure Laterality Date  .  Vaginal hysterectomy  1980  . Eye surgery Bilateral 2010    cataract Dr. Bing Plume  . Colonoscopy  06-16-2007    Internal hemorrhoids,laxity of anal sphincter,polyp at 25cm form anal verge, tortuous sigmoid colon. Dr.Orr    Family History  Problem Relation Age of Onset  . Colon cancer Neg Hx   . Stomach cancer Neg Hx   . Alzheimer's disease Mother   . Diabetes Mother   . Transient ischemic attack Mother   . Heart disease Mother     MI, CHF  . Alcohol abuse Father   . Kidney disease Father   . Hypertension Sister   . Hypertension Brother   . Heart disease Brother   . Hypertension Brother   . Hypertension Sister   . Hypertension Brother   . Heart disease Son 53    CABG   History  Substance Use Topics  . Smoking status: Never Smoker   . Smokeless tobacco: Never Used  . Alcohol Use: No   OB History   Grav Para Term Preterm Abortions TAB SAB Ect Mult Living                 Review of Systems  Constitutional: Negative for fever, chills, diaphoresis and unexpected weight change.  HENT: Positive for congestion (Since pollen) and rhinorrhea.   Respiratory: Positive for cough. Negative for shortness of breath.   Cardiovascular: Negative for chest pain.  Gastrointestinal: Positive for blood in stool. Negative for vomiting and abdominal pain.  Genitourinary: Positive for vaginal bleeding. Negative for dysuria and hematuria.  Musculoskeletal: Positive for back pain.  Neurological: Negative for weakness and numbness.  Hematological: Bruises/bleeds easily.  All other systems reviewed and are negative.     Allergies  Vioxx  Home Medications   Prior to Admission medications   Medication Sig Start Date End Date Taking? Authorizing Provider  ALPRAZolam Duanne Moron) 0.5 MG tablet Take 0.5 mg by mouth at bedtime as needed for sleep.   Yes Historical Provider, MD  Ascorbic Acid (VITAMIN C PO) Take by mouth daily.   Yes Historical Provider, MD  aspirin 81 MG tablet Take 81 mg by mouth  daily. Take 1 tablet daily to prevent heart attack, stroke and clotting.   Yes Historical Provider, MD  Calcium Carbonate-Vitamin D (CALCIUM + D PO) Take by mouth daily. Take 2 tablet daily for bone and muscle health.   Yes Historical Provider, MD  Cholecalciferol (VITAMIN D PO) Take by mouth daily. Take 1 tablet once daily for vitamin d supplement.   Yes Historical Provider, MD  fexofenadine-pseudoephedrine (ALLEGRA-D 24) 180-240 MG per 24 hr tablet Take 1 tablet by mouth daily. Take one tablet daily for allergies   Yes Historical Provider, MD  fish oil-omega-3 fatty acids 1000 MG capsule Take 1 g by mouth daily. Take 1 tablet daily.   Yes Historical Provider, MD  Multiple Vitamin (MULTIVITAMIN) tablet Take 1 tablet by mouth daily.   Yes Historical Provider, MD  naproxen sodium (ANAPROX) 220 MG tablet Take 220-440 mg by mouth. Take 1-2 tablets three times daily as needed for pain   Yes Historical Provider, MD  nizatidine (AXID) 150 MG capsule Take 150 mg by mouth 2 (two) times daily as needed. Take one tablet twice daily as needed to help stomach.   Yes Historical Provider, MD  pravastatin (PRAVACHOL) 40 MG tablet Take 40 mg by mouth daily.   Yes Historical Provider, MD  VITAMIN E PO Take 1 capsule by mouth daily.    Yes Historical Provider, MD   Temp(Src) 98.5 F (36.9 C) (Oral)  Resp 16  SpO2 96% Physical Exam  Nursing note and vitals reviewed. Constitutional: She is oriented to person, place, and time. She appears well-developed and well-nourished.  HENT:  Head: Normocephalic and atraumatic.  Right Ear: External ear normal.  Left Ear: External ear normal.  Nose: Nose normal.  Eyes: Right eye exhibits no discharge. Left eye exhibits no discharge.  Cardiovascular: Normal rate, regular rhythm and normal heart sounds.   Pulmonary/Chest: Effort normal and breath sounds normal. She has no wheezes. She has no rales.  Abdominal: Soft. She exhibits no distension. There is no tenderness.   Musculoskeletal:       Thoracic back: She exhibits no tenderness.       Lumbar back: She exhibits no tenderness.  No tenderness to pelvis or with ROM of hips  Neurological: She is alert and oriented to person, place, and time. She has normal strength. No sensory deficit.  Reflex Scores:      Patellar reflexes are 2+ on the right side and 2+ on the left side.      Achilles reflexes are 2+ on the right side and 2+ on the left side. 5/5 strength in bilateral lower extremities.   Skin: Skin is warm and dry. No ecchymosis (no signs of significant bruising) noted.    ED Course  Procedures (including critical care time) Labs Review Labs Reviewed  BASIC METABOLIC PANEL - Abnormal; Notable for the following:    Glucose, Bld 121 (*)    GFR calc non Af Amer 81 (*)    All other components within normal  limits  HEPATIC FUNCTION PANEL - Abnormal; Notable for the following:    AST 48 (*)    ALT 45 (*)    Total Bilirubin <0.2 (*)    All other components within normal limits  URINALYSIS, ROUTINE W REFLEX MICROSCOPIC - Abnormal; Notable for the following:    Leukocytes, UA MODERATE (*)    All other components within normal limits  CBC  PROTIME-INR  APTT  URINE MICROSCOPIC-ADD ON  POC OCCULT BLOOD, ED    Imaging Review Dg Chest 2 View  11/22/2013   CLINICAL DATA:  Hemoptysis  EXAM: CHEST  2 VIEW  COMPARISON:  None.  FINDINGS: The lungs are mildly hyperexpanded with evidence suggesting underlying emphysematous change. There is mild scarring in the left base. Elsewhere lungs are clear. The heart size is normal. The pulmonary vascularity reflects underlying emphysema. No adenopathy. There is upper thoracic levoscoliosis.  IMPRESSION: Underlying emphysema with mild scarring left base. No edema or consolidation. If hemoptysis persists, correlation with chest CT would be reasonable for further assessment.   Electronically Signed   By: Lowella Grip M.D.   On: 11/22/2013 13:14   Ct Angio Chest  W/cm &/or Wo Cm  11/22/2013   CLINICAL DATA:  HEMOPTYSIS  EXAM: CT ANGIOGRAPHY CHEST WITH CONTRAST  TECHNIQUE: Multidetector CT imaging of the chest was performed using the standard protocol during bolus administration of intravenous contrast. Multiplanar CT image reconstructions and MIPs were obtained to evaluate the vascular anatomy.  CONTRAST:  168mL OMNIPAQUE IOHEXOL 350 MG/ML SOLN  COMPARISON:  DG CHEST 2V dated 11/22/2013  FINDINGS: The thoracic inlet is unremarkable.  There is no evidence of mediastinal masses. Atherosclerotic calcifications are appreciated within the coronary vessels.  Mediastinal and hilar lymph nodes, measured in short axis:  - 13 mm subcarinal lymph node image 38 series 4  - Left hilar lymph node image 44 series 4, 10 mm.  - 7 mm right hilar lymph node image 48 series 4.  No mediastinal masses.  There is no evidence of filling defects within the main, lobar, or segmental pulmonary arteries. There is no evidence of a thoracic aortic aneurysm nor dissection.  There are areas of increased density within the lung bases with linear components. There also areas of intra lobular and subpleural septal thickening within the lung bases. Mild bronchiectasis in the left lung base. No focal regions of consolidation no focal infiltrates. There is no evidence of masses nor appreciable pulmonary nodules.  Central airways are patent.  The visualized upper abdominal viscera demonstrate no gross abnormalities.  There are no aggressive appearing osseous lesions.  Review of the MIP images confirms the above findings.  IMPRESSION: 1. No CT evidence of pulmonary arterial embolic disease. 2. Enlarged lymph nodes in the right hilar and subcarinal region. These may represent reactive lymph nodes, clinical correlation recommended. There is otherwise no evidence of mediastinal or parenchymal masses or nodules nor infiltrates. 3. Atelectasis versus scarring within the lung bases as well as interstitial fibrotic  changes. If clinically warranted further evaluation with dedicated high-resolution protocol CT recommended.   Electronically Signed   By: Margaree Mackintosh M.D.   On: 11/22/2013 17:36     EKG Interpretation None      MDM   Final diagnoses:  Hemoptysis  Low back pain  Enlarged lymph nodes    There is no obvious cause of the patient's hemoptysis. She does have mildly enlarged lymph nodes in her right hilar and subcarinal region, this could be reactive from a  viral process. No obvious mass or tumor. No infiltrate. Patient not any shortness of breath or distress. No pulmonary embolism seen. She was given Tussionex here to help with her cough. I believe her back symptoms are from her cough. Given her age in no obvious trauma though I did offer a lumbar x-ray which the family and patient declined. I discussed we could be missing more concerning things such as mass or fracture and they still do not want an xray. Given there is no obvious bacterial cause infection I do not feel she needs antibiotics. She declines evaluation for GU or rectal bleeding. No thrombocytopenia, elevated PTT or INR to give rise to a reason for bleeding. Will give return precautions, cough control and tylenol (tylenol controls her pain well here). Will refer to pulmonology. Family understands return precautions and is comfortable with discharge and plan.    Ephraim Hamburger, MD 11/22/13 Einar Crow

## 2013-11-22 NOTE — Discharge Instructions (Signed)
Back Pain, Adult Low back pain is very common. About 1 in 5 people have back pain.The cause of low back pain is rarely dangerous. The pain often gets better over time.About half of people with a sudden onset of back pain feel better in just 2 weeks. About 8 in 10 people feel better by 6 weeks.  CAUSES Some common causes of back pain include:  Strain of the muscles or ligaments supporting the spine.  Wear and tear (degeneration) of the spinal discs.  Arthritis.  Direct injury to the back. DIAGNOSIS Most of the time, the direct cause of low back pain is not known.However, back pain can be treated effectively even when the exact cause of the pain is unknown.Answering your caregiver's questions about your overall health and symptoms is one of the most accurate ways to make sure the cause of your pain is not dangerous. If your caregiver needs more information, he or she may order lab work or imaging tests (X-rays or MRIs).However, even if imaging tests show changes in your back, this usually does not require surgery. HOME CARE INSTRUCTIONS For many people, back pain returns.Since low back pain is rarely dangerous, it is often a condition that people can learn to manageon their own.   Remain active. It is stressful on the back to sit or stand in one place. Do not sit, drive, or stand in one place for more than 30 minutes at a time. Take short walks on level surfaces as soon as pain allows.Try to increase the length of time you walk each day.  Do not stay in bed.Resting more than 1 or 2 days can delay your recovery.  Do not avoid exercise or work.Your body is made to move.It is not dangerous to be active, even though your back may hurt.Your back will likely heal faster if you return to being active before your pain is gone.  Pay attention to your body when you bend and lift. Many people have less discomfortwhen lifting if they bend their knees, keep the load close to their bodies,and  avoid twisting. Often, the most comfortable positions are those that put less stress on your recovering back.  Find a comfortable position to sleep. Use a firm mattress and lie on your side with your knees slightly bent. If you lie on your back, put a pillow under your knees.  Only take over-the-counter or prescription medicines as directed by your caregiver. Over-the-counter medicines to reduce pain and inflammation are often the most helpful.Your caregiver may prescribe muscle relaxant drugs.These medicines help dull your pain so you can more quickly return to your normal activities and healthy exercise.  Put ice on the injured area.  Put ice in a plastic bag.  Place a towel between your skin and the bag.  Leave the ice on for 15-20 minutes, 03-04 times a day for the first 2 to 3 days. After that, ice and heat may be alternated to reduce pain and spasms.  Ask your caregiver about trying back exercises and gentle massage. This may be of some benefit.  Avoid feeling anxious or stressed.Stress increases muscle tension and can worsen back pain.It is important to recognize when you are anxious or stressed and learn ways to manage it.Exercise is a great option. SEEK MEDICAL CARE IF:  You have pain that is not relieved with rest or medicine.  You have pain that does not improve in 1 week.  You have new symptoms.  You are generally not feeling well. SEEK   IMMEDIATE MEDICAL CARE IF:   You have pain that radiates from your back into your legs.  You develop new bowel or bladder control problems.  You have unusual weakness or numbness in your arms or legs.  You develop nausea or vomiting.  You develop abdominal pain.  You feel faint. Document Released: 07/19/2005 Document Revised: 01/18/2012 Document Reviewed: 12/07/2010 [employer] Patient Information 2014 [employer].  

## 2013-11-22 NOTE — Progress Notes (Addendum)
Subjective:    Patient ID: Caroline Snyder, female    DOB: 1940-11-27, 73 y.o.   MRN: [id number] This chart was scribed for Arlyss Queen, MD by Rolanda Lundborg, ED Scribe. This patient was seen in room 4 and the patient's care was started at 12:06 PM.  Chief Complaint  Patient presents with  . Generalized Body Aches  . Cough    blood-tinged    HPI HPI Comments: Caroline Snyder is a 73 y.o. female who presents to the [facility] complaining of a constant, gradually-worsening cough onset yesterday. She states she started feeling fatigued and just "bad" 2 days ago. She reports intermittent blood-tinged sputum after bad coughing spells. She reports a sore throat secondary to the cough. She also reports body aches, especially in her lower back. She denies recent travels. She denies leg swelling. Never smoker. She lives with a smoker.  PCP - GREEN, Viviann Spare, MD  Past Medical History  Diagnosis Date  . Allergy   . Depression 05/05/2007  . Hyperlipidemia 06/29/2006  . 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 Bunion 07/01/2010  . Unspecified essential hypertension 11/22/2007  . Osteoporosis, unspecified 04/15/2004  . Memory loss 04/15/2003  . Migraine without aura, without mention of intractable migraine without mention of status migrainosus 03/24/1999  . 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 External hemorrhoids without mention of complication 62/37/6283  . GERD (gastroesophageal reflux disease) 2000   Current Outpatient Prescriptions on File Prior to Visit  Medication Sig Dispense Refill  . acyclovir (ZOVIRAX) 200 MG capsule TAKE 1-2 CAPSULES BY MOUTH AS NEEDED  30 capsule  3  . ALPRAZolam (XANAX) 0.5 MG tablet TAKE 1 TABLET BY MOUTH EVERY NIGHT AT BEDTIME AS NEEDED FOR REST  30 tablet  1  . Ascorbic Acid (VITAMIN C PO) Take by mouth daily.      Marland Kitchen aspirin 81 MG tablet Take 81 mg by mouth daily. Take 1 tablet daily to prevent heart attack, stroke and  clotting.      . Calcium Carbonate-Vitamin D (CALCIUM + D PO) Take by mouth daily. Take 2 tablet daily for bone and muscle health.      . Cholecalciferol (VITAMIN D PO) Take by mouth daily. Take 1 tablet once daily for vitamin d supplement.      . fexofenadine-pseudoephedrine (ALLEGRA-D 24) 180-240 MG per 24 hr tablet Take 1 tablet by mouth daily. Take one tablet daily for allergies      . fish oil-omega-3 fatty acids 1000 MG capsule Take 1 g by mouth daily. Take 1 tablet daily for vitamin e supplement.      . Multiple Vitamin (MULTIVITAMIN) tablet Take 1 tablet by mouth daily.      . naproxen sodium (ANAPROX) 220 MG tablet Take 220 mg by mouth 2 (two) times daily with a meal. Take 1-2 tablets three times daily as needed for pain      . nizatidine (AXID) 150 MG capsule Take 150 mg by mouth 2 (two) times daily. Take one tablet twice daily as needed to help stomach.      . pravastatin (PRAVACHOL) 40 MG tablet TAKE 1 TABLET BY MOUTH EVERY DAY  90 tablet  0  . VITAMIN E PO Take by mouth daily.       No current facility-administered medications on file prior to visit.   Allergies  Allergen Reactions  . Vioxx [Rofecoxib] Nausea Only     Review of Systems  Constitutional: Positive for fatigue.  HENT: Positive for sore throat.   Respiratory: Positive for cough.   Cardiovascular: Negative for leg swelling.  Musculoskeletal: Positive for back pain and myalgias.       Objective:   Physical Exam CONSTITUTIONAL: Thin and frail. Actively coughing up blood in the room. HEAD: Normocephalic/atraumatic EYES: EOMI/PERRL ENMT: Mucous membranes moist NECK: supple no meningeal signs SPINE:entire spine nontender CV: S1/S2 noted, no murmurs/rubs/gallops noted. Regular rate. LUNGS: Lungs are clear to auscultation bilaterally, no respiratory distress NEURO: Pt is awake/alert, moves all extremitiesx4 EXTREMITIES: pulses normal, full ROM SKIN: warm, color normal PSYCH: no abnormalities of mood noted    Filed Vitals:   11/22/13 1101  BP: 116/65  Pulse: 79  Temp: 98 F (36.7 C)  TempSrc: Oral  Resp: 18  Weight: 100 lb (45.36 kg)  SpO2: 95%    Results for orders placed in visit on 11/22/13  POCT CBC      Result Value Ref Range   WBC 7.0  4.6 - 10.2 K/uL   Lymph, poc 0.8  0.6 - 3.4   POC LYMPH PERCENT 12.1  10 - 50 %L   MID (cbc) 0.3  0 - 0.9   POC MID % 3.7  0 - 12 %M   POC Granulocyte 5.9  2 - 6.9   Granulocyte percent 84.2 (*) 37 - 80 %G   RBC 4.22  4.04 - 5.48 M/uL   Hemoglobin 12.7  12.2 - 16.2 g/dL   HCT, POC 40.4  37.7 - 47.9 %   MCV 95.8  80 - 97 fL   MCH, POC 30.1  27 - 31.2 pg   MCHC 31.4 (*) 31.8 - 35.4 g/dL   RDW, POC 13.8     Platelet Count, POC 211  142 - 424 K/uL   MPV 9.7  0 - 99.8 fL     [facility] preliminary x-ray report read by Dr. Everlene Farrier:  CXR: Patient has hyperaeration there are mild increased markings in the right base       Assessment & Plan:  **Disclaimer: This note was dictated with voice recognition software. Similar sounding words can inadvertently be transcribed and this note may contain transcription errors which may not have been corrected upon publication of note.** I personally performed the services described in this documentation, which was scribed in my presence. The recorded information has been reviewed and is accurate. Patient sent to the emergency room at [facility]  for further evaluation of your hemoptysis. She did have a stat d-dimer drawn while she was in the office results pending.

## 2013-11-23 ENCOUNTER — Ambulatory Visit: Payer: Medicare Other | Admitting: Gynecology

## 2013-11-23 ENCOUNTER — Telehealth: Payer: Self-pay | Admitting: *Deleted

## 2013-11-23 ENCOUNTER — Telehealth: Payer: Self-pay

## 2013-11-23 ENCOUNTER — Other Ambulatory Visit: Payer: Self-pay | Admitting: Emergency Medicine

## 2013-11-23 DIAGNOSIS — R042 Hemoptysis: Secondary | ICD-10-CM

## 2013-11-23 NOTE — Telephone Encounter (Signed)
Patient son, [name], called and stated that his mother went to Urgent Care and they sent her to ER for coughing up blood. Patient is concerned because they told him that she has a Dx of underlying emphysema and he is concerned because this will go in her medical record and affect the life insurance. Wanted to know why this Dr. Rockey Situ him this and how it was determined and how he could not give her a steroid shot to help clear her up. I explained without seeing the patient and reviewing the records we could not give that information. Offered him an appointment but he stated that he has also put a call into the Urgent Care and he will wait till next week. Informed him that if his mother worsened he needed to take her to the ER. He agreed.

## 2013-11-23 NOTE — Telephone Encounter (Signed)
Dr. Jacqulyn Bath Waynetta Pean is calling for some relief on Caroline Snyder's situation.  The hospital did not give her a steroid injection to calm down her bronchitises.  She keeps pulling more muscles.  [facility] nurse advised he call you for medication.     [phone]

## 2013-11-23 NOTE — Telephone Encounter (Signed)
I called and talked with patient's son she has had a good deal of chemical exposure. I told him we could consider given her steroid shot in the morning. I also put in a referral for pulmonary.

## 2013-11-24 ENCOUNTER — Ambulatory Visit (INDEPENDENT_AMBULATORY_CARE_PROVIDER_SITE_OTHER): Payer: Medicare Other | Admitting: Emergency Medicine

## 2013-11-24 ENCOUNTER — Ambulatory Visit: Payer: Medicare Other

## 2013-11-24 ENCOUNTER — Inpatient Hospital Stay (HOSPITAL_COMMUNITY)
Admission: EM | Admit: 2013-11-24 | Discharge: 2013-11-26 | DRG: 194 | Disposition: A | Discharge status: Home / Self Care | Payer: Medicare Other | Source: Other Acute Inpatient Hospital | Attending: Family Medicine | Admitting: Family Medicine

## 2013-11-24 VITALS — BP 140/82 | HR 70 | Temp 97.6°F | Resp 16

## 2013-11-24 DIAGNOSIS — Z860100 Personal history of colon polyps, unspecified: Secondary | ICD-10-CM

## 2013-11-24 DIAGNOSIS — R630 Anorexia: Secondary | ICD-10-CM | POA: Diagnosis present

## 2013-11-24 DIAGNOSIS — R05 Cough: Secondary | ICD-10-CM

## 2013-11-24 DIAGNOSIS — F329 Major depressive disorder, single episode, unspecified: Secondary | ICD-10-CM | POA: Diagnosis present

## 2013-11-24 DIAGNOSIS — Z841 Family history of disorders of kidney and ureter: Secondary | ICD-10-CM

## 2013-11-24 DIAGNOSIS — M549 Dorsalgia, unspecified: Secondary | ICD-10-CM

## 2013-11-24 DIAGNOSIS — Z6379 Other stressful life events affecting family and household: Secondary | ICD-10-CM

## 2013-11-24 DIAGNOSIS — R7401 Elevation of levels of liver transaminase levels: Secondary | ICD-10-CM | POA: Diagnosis present

## 2013-11-24 DIAGNOSIS — M810 Age-related osteoporosis without current pathological fracture: Secondary | ICD-10-CM | POA: Diagnosis present

## 2013-11-24 DIAGNOSIS — R7402 Elevation of levels of lactic acid dehydrogenase (LDH): Secondary | ICD-10-CM | POA: Diagnosis present

## 2013-11-24 DIAGNOSIS — R042 Hemoptysis: Secondary | ICD-10-CM

## 2013-11-24 DIAGNOSIS — Z823 Family history of stroke: Secondary | ICD-10-CM

## 2013-11-24 DIAGNOSIS — R112 Nausea with vomiting, unspecified: Secondary | ICD-10-CM | POA: Diagnosis present

## 2013-11-24 DIAGNOSIS — E785 Hyperlipidemia, unspecified: Secondary | ICD-10-CM | POA: Diagnosis present

## 2013-11-24 DIAGNOSIS — F3289 Other specified depressive episodes: Secondary | ICD-10-CM | POA: Diagnosis present

## 2013-11-24 DIAGNOSIS — R059 Cough, unspecified: Secondary | ICD-10-CM

## 2013-11-24 DIAGNOSIS — K920 Hematemesis: Secondary | ICD-10-CM | POA: Diagnosis not present

## 2013-11-24 DIAGNOSIS — R740 Nonspecific elevation of levels of transaminase and lactic acid dehydrogenase [LDH]: Secondary | ICD-10-CM

## 2013-11-24 DIAGNOSIS — R9389 Abnormal findings on diagnostic imaging of other specified body structures: Secondary | ICD-10-CM

## 2013-11-24 DIAGNOSIS — K219 Gastro-esophageal reflux disease without esophagitis: Secondary | ICD-10-CM | POA: Diagnosis present

## 2013-11-24 DIAGNOSIS — Z820 Family history of epilepsy and other diseases of the nervous system: Secondary | ICD-10-CM

## 2013-11-24 DIAGNOSIS — Z8249 Family history of ischemic heart disease and other diseases of the circulatory system: Secondary | ICD-10-CM

## 2013-11-24 DIAGNOSIS — J189 Pneumonia, unspecified organism: Secondary | ICD-10-CM

## 2013-11-24 DIAGNOSIS — Z833 Family history of diabetes mellitus: Secondary | ICD-10-CM

## 2013-11-24 DIAGNOSIS — E871 Hypo-osmolality and hyponatremia: Secondary | ICD-10-CM | POA: Diagnosis present

## 2013-11-24 DIAGNOSIS — I10 Essential (primary) hypertension: Secondary | ICD-10-CM | POA: Diagnosis present

## 2013-11-24 HISTORY — DX: Abnormal findings on diagnostic imaging of other specified body structures: R93.89

## 2013-11-24 LAB — CBC
HCT: 32.6 % — ABNORMAL LOW (ref 36.0–46.0)
HEMOGLOBIN: 11.1 g/dL — AB (ref 12.0–15.0)
MCH: 30.9 pg (ref 26.0–34.0)
MCHC: 34 g/dL (ref 30.0–36.0)
MCV: 90.8 fL (ref 78.0–100.0)
PLATELETS: 153 10*3/uL (ref 150–400)
RBC: 3.59 MIL/uL — AB (ref 3.87–5.11)
RDW: 13 % (ref 11.5–15.5)
WBC: 4.5 10*3/uL (ref 4.0–10.5)

## 2013-11-24 LAB — STREP PNEUMONIAE URINARY ANTIGEN: Strep Pneumo Urinary Antigen: NEGATIVE

## 2013-11-24 LAB — COMPREHENSIVE METABOLIC PANEL
ALBUMIN: 3 g/dL — AB (ref 3.5–5.2)
ALT: 143 U/L — ABNORMAL HIGH (ref 0–35)
AST: 172 U/L — ABNORMAL HIGH (ref 0–37)
Alkaline Phosphatase: 99 U/L (ref 39–117)
BUN: 10 mg/dL (ref 6–23)
CALCIUM: 8.5 mg/dL (ref 8.4–10.5)
CO2: 23 mEq/L (ref 19–32)
CREATININE: 0.57 mg/dL (ref 0.50–1.10)
Chloride: 94 mEq/L — ABNORMAL LOW (ref 96–112)
GFR calc Af Amer: >90 mL/min (ref >90)
GFR calc non Af Amer: 89 mL/min — ABNORMAL LOW (ref >90)
Glucose, Bld: 158 mg/dL — ABNORMAL HIGH (ref 70–99)
Potassium: 3.8 mEq/L (ref 3.7–5.3)
Sodium: 129 mEq/L — ABNORMAL LOW (ref 137–147)
TOTAL PROTEIN: 6.1 g/dL (ref 6.0–8.3)

## 2013-11-24 LAB — POCT CBC
Granulocyte percent: 74 %G (ref 37–80)
HCT, POC: 34.7 % — AB (ref 37.7–47.9)
Hemoglobin: 10.8 g/dL — AB (ref 12.2–16.2)
Lymph, poc: 1 (ref 0.6–3.4)
MCH: 29.6 pg (ref 27–31.2)
MCHC: 31.1 g/dL — AB (ref 31.8–35.4)
MCV: 95 fL (ref 80–97)
MID (CBC): 0.1 (ref 0–0.9)
MPV: 9.1 fL (ref 0–99.8)
POC Granulocyte: 3.1 (ref 2–6.9)
POC LYMPH PERCENT: 22.7 %L (ref 10–50)
POC MID %: 3.3 %M (ref 0–12)
Platelet Count, POC: 162 10*3/uL (ref 142–424)
RBC: 3.65 M/uL — AB (ref 4.04–5.48)
RDW, POC: 13.1 %
WBC: 4.2 10*3/uL — AB (ref 4.6–10.2)

## 2013-11-24 LAB — HIV ANTIBODY (ROUTINE TESTING W REFLEX): HIV: NONREACTIVE

## 2013-11-24 LAB — TROPONIN I: Troponin I: <0.3 ng/mL (ref <0.30)

## 2013-11-24 MED ORDER — LEVOFLOXACIN 750 MG PO TABS
750.0000 mg | ORAL_TABLET | ORAL | Status: DC
  Administered 2013-11-24: 750 mg via ORAL
  Filled 2013-11-24: qty 1

## 2013-11-24 MED ORDER — AZITHROMYCIN 500 MG PO TABS
500.0000 mg | ORAL_TABLET | Freq: Every day | ORAL | Status: DC
  Administered 2013-11-24: 500 mg via ORAL
  Filled 2013-11-24 (×2): qty 1

## 2013-11-24 MED ORDER — SODIUM CHLORIDE 0.9 % IJ SOLN
3.0000 mL | Freq: Two times a day (BID) | INTRAMUSCULAR | Status: DC
  Administered 2013-11-25 – 2013-11-26 (×2): 3 mL via INTRAVENOUS

## 2013-11-24 MED ORDER — HYDROCODONE-ACETAMINOPHEN 5-325 MG PO TABS
1.0000 | ORAL_TABLET | Freq: Four times a day (QID) | ORAL | Status: DC
  Administered 2013-11-24 – 2013-11-26 (×8): 1 via ORAL
  Filled 2013-11-24 (×8): qty 1

## 2013-11-24 MED ORDER — POTASSIUM CHLORIDE IN NACL 20-0.45 MEQ/L-% IV SOLN
INTRAVENOUS | Status: DC
  Filled 2013-11-24 (×3): qty 1000

## 2013-11-24 MED ORDER — ONDANSETRON HCL 4 MG/2ML IJ SOLN
4.0000 mg | Freq: Four times a day (QID) | INTRAMUSCULAR | Status: DC | PRN
  Administered 2013-11-24: 4 mg via INTRAVENOUS
  Filled 2013-11-24 (×2): qty 2

## 2013-11-24 MED ORDER — ONDANSETRON HCL 4 MG PO TABS
4.0000 mg | ORAL_TABLET | Freq: Four times a day (QID) | ORAL | Status: DC | PRN
  Administered 2013-11-24 – 2013-11-26 (×2): 4 mg via ORAL
  Filled 2013-11-24 (×3): qty 1

## 2013-11-24 MED ORDER — LEVOFLOXACIN 500 MG PO TABS: 500.0000 mg | ORAL_TABLET | Freq: Every day | ORAL | Status: DC

## 2013-11-24 MED ORDER — SODIUM CHLORIDE 0.9 % IV BOLUS (SEPSIS)
500.0000 mL | Freq: Once | INTRAVENOUS | Status: AC
  Administered 2013-11-24: 500 mL via INTRAVENOUS

## 2013-11-24 MED ORDER — ALPRAZOLAM 0.5 MG PO TABS
0.5000 mg | ORAL_TABLET | Freq: Every evening | ORAL | Status: DC | PRN
  Administered 2013-11-24: 0.5 mg via ORAL
  Filled 2013-11-24: qty 1

## 2013-11-24 MED ORDER — SIMVASTATIN 20 MG PO TABS
20.0000 mg | ORAL_TABLET | Freq: Every day | ORAL | Status: DC
  Administered 2013-11-24 – 2013-11-25 (×2): 20 mg via ORAL
  Filled 2013-11-24 (×3): qty 1

## 2013-11-24 MED ORDER — HYDROCOD POLST-CHLORPHEN POLST 10-8 MG/5ML PO LQCR: 5.0000 mL | Freq: Two times a day (BID) | ORAL | Status: DC | PRN

## 2013-11-24 MED ORDER — FAMOTIDINE 20 MG PO TABS
20.0000 mg | ORAL_TABLET | Freq: Two times a day (BID) | ORAL | Status: DC
  Administered 2013-11-24 – 2013-11-26 (×5): 20 mg via ORAL
  Filled 2013-11-24 (×7): qty 1

## 2013-11-24 MED ORDER — SODIUM CHLORIDE 0.9 % IV SOLN
INTRAVENOUS | Status: DC
  Administered 2013-11-24: 16:00:00 via INTRAVENOUS

## 2013-11-24 NOTE — H&P (Signed)
Attending Addendum  I examined the patient and discussed the assessment and plan with Dr. Yong Channel. I have reviewed the note and agree.  CC: cough and malaise  HPI: 73 yo F with history of chronic cough, GERD, depression admitted with worsening cough x 4 days. Cough is intermittently productive of blood tinged fluid,  pleuritic chest and back pain, and frontal headache, malaise, and anorexia. She denies sick contacts, fever. She was seen at her PCP office on 4/23 and from there sent to [facility] for further evaluation and to rule out PE.  She did not have an new O2 requirement.   11/22/13: CXR  scarring L bases D-dimer negative CTA chest  ([facility]): negative for PE, atelectasis and scarring b/l bases. R hilar and subcarinal lymphadenopathy.  WBC 6.0  Hgb 13.2  Patient seen home with tussionex.   11/24/13:  CXR: minimal opacity in the right middle lobe  WBC 4.2  Hgb 10.8   BP 137/60  Pulse 67  Temp(Src) 97.4 F (36.3 C) (Oral)  Resp 18  Ht 5' (1.524 m)  Wt 100 lb 4.8 oz (45.496 kg)  BMI 19.59 kg/m2  SpO2 97% Reviewed weights from outpatient visits, stable  General appearance: alert, cooperative and no distress, appears fatigue  Throat: clear oropharynx, dry mucus membranes  Neck: no adenopathy, symmetrical, trachea midline Back: symmetric, No CVA tenderness, no bony tenderness  Lungs: normal WOB, intermittent cough, coarse BS b/l, no wheezing  Heart: regular rate and rhythm, S1, S2 normal, no murmur, click, rub or gallop Abdomen: soft, non-tender; bowel sounds normal; no masses,  no organomegaly Extremities: edema absent    73 yo F with chronic cough and GERD admitted for acute worsening of cough, with mild hemoptysis and malaise concerning for CAP, PE has been ruled out.  1. CAP: Start antibiotics Levaquin and azithromycin. Vicodin for pain relief and cough suppression.  O2 as needed-patient does not have an O2 requirement. Monitor vitals: she is currently hemodynamically stable.   Urine legionella and strep.  Monitor H/H given repeat hemoptysis. After treatment for CAP it would be prudent to have a f/u CT for resolution of adenopathy. 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  Anticipate d/c home in 1-2 days pending improvement. Will obtain pulm consult if patient does not improve with treatment for pneumonia.     Boykin Nearing, [place]

## 2013-11-24 NOTE — Progress Notes (Signed)
Checked in with patient. She states she feels much better after the fluids and vicodin. Pain is well controlled and cough is minimal except when getting up. Not sure if any more hemoptysis because has had red popsicles so may just be that material. Told her I would check on her in the AM.   Caroline Snyder. Melanee Spry, MD, PGY3 11/24/2013 9:44 PM

## 2013-11-24 NOTE — Patient Instructions (Signed)
You are being admitted to [facility] room 18, and the resident pager number [phone].

## 2013-11-24 NOTE — Progress Notes (Signed)
   Subjective:    Patient ID: Caroline Snyder, female    DOB: [birth year], 73 y.o.   MRN: [id number]  HPI patient seen here in followup. She continues to have an unrelenting cough. She denies being short of breath. She has severe pain in her back. She continues to cough up bright red blood. She denies specific shortness of breath. She also has been hurting in the front of her chest. She was seen in the emergency room after her visit here for CT scan of the chest. This revealed basilar fibrosis. There is also atelectasis. There are also enlarged lymph nodes in the hilar and subcarinal area. She has been exposed to secondhand smoke but is nonsmoker    Review of Systems     Objective:   Physical Exam is awake but seems drowsy. Her neck is supple without adenopathy. Chest exam reveals occasional rhonchi but no wheezing noted cardiac exam is unremarkable the abdomen is soft. Neurologically patient is somnolent without focal neurological signs. Extremities without edema Results for orders placed in visit on 11/24/13  POCT CBC      Result Value Ref Range   WBC 4.2 (*) 4.6 - 10.2 K/uL   Lymph, poc 1.0  0.6 - 3.4   POC LYMPH PERCENT 22.7  10 - 50 %L   MID (cbc) 0.1  0 - 0.9   POC MID % 3.3  0 - 12 %M   POC Granulocyte 3.1  2 - 6.9   Granulocyte percent 74.0  37 - 80 %G   RBC 3.65 (*) 4.04 - 5.48 M/uL   Hemoglobin 10.8 (*) 12.2 - 16.2 g/dL   HCT, POC 34.7 (*) 37.7 - 47.9 %   MCV 95.0  80 - 97 fL   MCH, POC 29.6  27 - 31.2 pg   MCHC 31.1 (*) 31.8 - 35.4 g/dL   RDW, POC 13.1     Platelet Count, POC 162  142 - 424 K/uL   MPV 9.1  0 - 99.8 fL   [facility] reading (PRIMARY) by  Dr. Everlene Farrier appears to be a developing infiltrate in the right lower lobe not present on previous film      Assessment & Plan:  Hemoglobin has fallen to 10.8. Chest x-ray shows a developing infiltrate patient will be a direct admit to 6 E. room 18. I would feel better if we sent her by ambulance. There  Is  also some concern  with her lymph nodes in her hoarseness whether she could have a laryngeal cancer. Certainly would strongly consider ENT evaluation of her larynx and probable bronchoscopy. Her significant back pain is also of concern if it turns out she has some type of malignancy

## 2013-11-24 NOTE — H&P (Signed)
[facility] Admission History and Physical Service Pager: [phone]  Patient name: Caroline Snyder Medical record number: [id number] Date of birth: 1940-11-25 Age: 73 y.o. Gender: female  Primary Care Provider: Estill Dooms, MD Consultants: None Code Status: Full Code  Chief Complaint: Hemoptysis  Assessment and Plan: Caroline Snyder is a 73 y.o. female presenting with hemoptysis . PMH is significant for chronic cough.  # Community acquired pneumonia: developing infiltrate on CXR seen in right lobe. Patient having symptoms of significant cough and associated pain with cough. No fevers.  Admit to inpatient, telemetry, attending Dr. Adrian Blackwater  Levoflaxacin 750mg  QOD  Follow-up urine legionella and strep  Due to chest pain for several days with cough, obtained troponin x 1 which was negative. EKG Normal sinus rhythm  Vicodin PRN for pain and cough  Cardiac monitoring  O2 to keep O2 sats >92%  Continuous pulse ox  # Hemoptysis: Currently resolved. Most likely related to recent worsening of cough. Hemoglobin decreased from two days ago but currently stable today (last hgb: 11.1).   Follow-up CBC in AM  Could consider ENT or Pulmonology consult outpatient if recurs  # Transaminitis: AST/ALT elevated from two days ago. Could be related to dehydration. Patient does not have a history of liver pathology. PT and INR normal (13 and 1.0 respectively).  CMET tomorrow to trend enzymes  # Hyperlipidemia  Continue statin  # Gerd  Continue H2 blocker  # Chronic cough: patient has had this for a year. No unexplained weight loss. CT did not show any cavitary lesion. Could be atypical pneumonia vs GERD.  Continue treatment with H2 blocker and levofloaxacin  FEN/GI: Zofran for nausea; NS @100ml /hr, 500 cc bolus, hypernatremia within 48 hours likely due to dehydration, will f/u in AM Prophylaxis: SCDs  Disposition: Admit to telemetry  History of  Present Illness: Caroline Snyder is a 73 y.o. female presenting with productive cough and hemoptysis  Went to see. Dr. Everlene Farrier on Thursday due to chronic cough with recent development of hemoptysis (mostly red at one point then faded) starting Wednesday night but had some yellow or green phlegm. Took some aleve and Mucinex DM starting on Wednesday with little relief. Complained of chest pain with cough but no shortness of breath. Denies fevers. Went to ED and they did a CT angio chest which did not show a cause of hemoptysis (did show mildly enlarged lymph nodes in right hilar and subcarinal region). Due to low back pain (worse with cough), they offered lumbar x-ray which patient declined. Declined evaluation for GU or rectal bleeding. No low plateelts or elevated PT/PTT/INR. Was referred to pulmonology.   Symptoms getting worse this morning/not improving so went back to urgent care. Mild nausea today and vomited x1 (nonbilious/nonbloody). Hemoglobin noticed to be down to 11.1 from 13.2 two days ago. Patient direct admitted from urgent care for evaluation.  Review Of Systems: Per HPI with the following additions: no diarrhea or constipation Otherwise 12 point review of systems was performed and was unremarkable.  Patient Active Problem List   Diagnosis Date Noted  . Hemoptysis 11/24/2013  . Abnormal CT of the chest 11/24/2013  . Cervicalgia 08/21/2013  . Cough 08/04/2013  . GERD (gastroesophageal reflux disease)   . ALLERGIC RHINITIS DUE TO POLLEN 04/27/2010  . PERSONAL HISTORY OF COLONIC POLYPS 04/27/2010  . Unspecified essential hypertension 11/22/2007  . Depression 05/05/2007  . Hyperlipidemia 06/29/2006   Past Medical History: Past Medical History  Diagnosis Date  .  Allergy   . Depression 05/05/2007  . Hyperlipidemia 06/29/2006  . 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 Bunion 07/01/2010  . Unspecified essential hypertension 11/22/2007  . Osteoporosis, unspecified 04/15/2004  .  Memory loss 04/15/2003  . Migraine without aura, without mention of intractable migraine without mention of status migrainosus 03/24/1999  . 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 External hemorrhoids without mention of complication 27/01/2375  . GERD (gastroesophageal reflux disease) 2000   Past Surgical History: Past Surgical History  Procedure Laterality Date  . Vaginal hysterectomy  1980  . Eye surgery Bilateral 2010    cataract Dr. Bing Plume  . Colonoscopy  06-16-2007    Internal hemorrhoids,laxity of anal sphincter,polyp at 25cm form anal verge, tortuous sigmoid colon. Dr.Orr   Never smoker, some mild second hand smoke exposure at young age  Social History: History  Substance Use Topics  . Smoking status: Never Smoker   . Smokeless tobacco: Never Used  . Alcohol Use: No   Additional social history: None  Please also refer to relevant sections of EMR.  Family History: Family History  Problem Relation Age of Onset  . Colon cancer Neg Hx   . Stomach cancer Neg Hx   . Alzheimer's disease Mother   . Diabetes Mother   . Transient ischemic attack Mother   . Heart disease Mother     MI, CHF  . Alcohol abuse Father   . Kidney disease Father   . Hypertension Sister   . Hypertension Brother   . Heart disease Brother   . Hypertension Brother   . Hypertension Sister   . Hypertension Brother   . Heart disease Son 63    CABG  no family history of lung disease Allergies and Medications: Allergies  Allergen Reactions  . Vioxx [Rofecoxib] Nausea Only   No current facility-administered medications on file prior to encounter.   Current Outpatient Prescriptions on File Prior to Encounter  Medication Sig Dispense Refill  . acetaminophen (TYLENOL) 500 MG tablet Take 1 tablet (500 mg total) by mouth every 4 (four) hours as needed for moderate pain.  30 tablet  0  . ALPRAZolam (XANAX) 0.5 MG tablet Take 0.5 mg by mouth at bedtime as needed for sleep.      . Ascorbic  Acid (VITAMIN C PO) Take by mouth daily.      Marland Kitchen aspirin 81 MG tablet Take 81 mg by mouth daily. Take 1 tablet daily to prevent heart attack, stroke and clotting.      . Calcium Carbonate-Vitamin D (CALCIUM + D PO) Take by mouth daily. Take 2 tablet daily for bone and muscle health.      . chlorpheniramine-HYDROcodone (TUSSIONEX PENNKINETIC ER) 10-8 MG/5ML LQCR Take 5 mLs by mouth every 12 (twelve) hours as needed for cough.  115 mL  0  . Cholecalciferol (VITAMIN D PO) Take by mouth daily. Take 1 tablet once daily for vitamin d supplement.      . fexofenadine-pseudoephedrine (ALLEGRA-D 24) 180-240 MG per 24 hr tablet Take 1 tablet by mouth daily. Take one tablet daily for allergies      . fish oil-omega-3 fatty acids 1000 MG capsule Take 1 g by mouth daily. Take 1 tablet daily.      . Multiple Vitamin (MULTIVITAMIN) tablet Take 1 tablet by mouth daily.      . naproxen sodium (ANAPROX) 220 MG tablet Take 220-440 mg by mouth. Take 1-2 tablets three times daily as needed for pain      .  nizatidine (AXID) 150 MG capsule Take 150 mg by mouth 2 (two) times daily as needed. Take one tablet twice daily as needed to help stomach.      . pravastatin (PRAVACHOL) 40 MG tablet Take 40 mg by mouth daily.      Marland Kitchen VITAMIN E PO Take 1 capsule by mouth daily.         Objective: BP 150/55  Pulse 62  Temp(Src) 97.9 F (36.6 C) (Oral)  Resp 16  Ht 5' (1.524 m)  Wt 100 lb 4.8 oz (45.496 kg)  BMI 19.59 kg/m2  SpO2 95%  Exam: General: Laying in bed, wrapped up in blankets, in no acute distress, pleasant HEENT: PERRL, EOMI, Moist mucous membranes Cardiovascular: Regular rate and rhythm Respiratory: Rhonchi present, no wheezing, no increased work of breathing Abdomen: soft, non-tender, non-distended Extremities: No calf tenderness, no erythema or swelling Skin: No visible rashes or cyanosis Neuro: Alert, oriented x3  Labs and Imaging: CBC BMET   Recent Labs Lab 11/24/13 1200  WBC 4.5  HGB 11.1*  HCT  32.6*  PLT 153    Recent Labs Lab 11/24/13 1200  NA 129*  K 3.8  CL 94*  CO2 23  BUN 10  CREATININE 0.57  GLUCOSE 158*  CALCIUM 8.5      Dg Chest 2 View  11/24/2013   CLINICAL DATA:  Hemoptysis  EXAM: CHEST  2 VIEW  COMPARISON:  CT ANGIO CHEST W/CM &/OR WO/CM dated 11/22/2013; DG CHEST 2V dated 11/22/2013  FINDINGS: The heart size and vascular pattern are normal. Left lung is clear. No pleural effusions. Minimal opacity seen in the right middle lobe.  IMPRESSION: Minimal opacity in the right middle lobe both on the frontal and lateral view concerning for developing infiltrate.   Electronically Signed   By: Skipper Cliche M.D.   On: 11/24/2013 10:01   Ct Angio Chest W/cm &/or Wo Cm  11/22/2013   CLINICAL DATA:  HEMOPTYSIS  EXAM: CT ANGIOGRAPHY CHEST WITH CONTRAST  TECHNIQUE: Multidetector CT imaging of the chest was performed using the standard protocol during bolus administration of intravenous contrast. Multiplanar CT image reconstructions and MIPs were obtained to evaluate the vascular anatomy.  CONTRAST:  196mL OMNIPAQUE IOHEXOL 350 MG/ML SOLN  COMPARISON:  DG CHEST 2V dated 11/22/2013  FINDINGS: The thoracic inlet is unremarkable.  There is no evidence of mediastinal masses. Atherosclerotic calcifications are appreciated within the coronary vessels.  Mediastinal and hilar lymph nodes, measured in short axis:  - 13 mm subcarinal lymph node image 38 series 4  - Left hilar lymph node image 44 series 4, 10 mm.  - 7 mm right hilar lymph node image 48 series 4.  No mediastinal masses.  There is no evidence of filling defects within the main, lobar, or segmental pulmonary arteries. There is no evidence of a thoracic aortic aneurysm nor dissection.  There are areas of increased density within the lung bases with linear components. There also areas of intra lobular and subpleural septal thickening within the lung bases. Mild bronchiectasis in the left lung base. No focal regions of consolidation no  focal infiltrates. There is no evidence of masses nor appreciable pulmonary nodules.  Central airways are patent.  The visualized upper abdominal viscera demonstrate no gross abnormalities.  There are no aggressive appearing osseous lesions.  Review of the MIP images confirms the above findings.  IMPRESSION: 1. No CT evidence of pulmonary arterial embolic disease. 2. Enlarged lymph nodes in the right hilar and subcarinal region.  These may represent reactive lymph nodes, clinical correlation recommended. There is otherwise no evidence of mediastinal or parenchymal masses or nodules nor infiltrates. 3. Atelectasis versus scarring within the lung bases as well as interstitial fibrotic changes. If clinically warranted further evaluation with dedicated high-resolution protocol CT recommended.   Electronically Signed   By: Margaree Mackintosh M.D.   On: 11/22/2013 17:36     Cordelia Poche, MD 11/24/2013, 11:08 AM PGY-1, [place] Intern pager: [phone], text pages welcome  Family Medicine Upper Level Addendum:   I have seen and examined the patient independently, discussed with Dr. Lonny Prude, fully reviewed the H+P and agree with it's contents with the additions as noted in blue text.    Garret Reddish, MD, PGY-3 11/24/2013 6:56 PM

## 2013-11-25 ENCOUNTER — Encounter (HOSPITAL_COMMUNITY): Payer: Self-pay | Admitting: *Deleted

## 2013-11-25 LAB — BASIC METABOLIC PANEL
BUN: 8 mg/dL (ref 6–23)
CALCIUM: 7.9 mg/dL — AB (ref 8.4–10.5)
CO2: 25 meq/L (ref 19–32)
CREATININE: 0.76 mg/dL (ref 0.50–1.10)
Chloride: 106 mEq/L (ref 96–112)
GFR calc Af Amer: >90 mL/min (ref >90)
GFR calc non Af Amer: 81 mL/min — ABNORMAL LOW (ref >90)
Glucose, Bld: 96 mg/dL (ref 70–99)
Potassium: 3.9 mEq/L (ref 3.7–5.3)
Sodium: 143 mEq/L (ref 137–147)

## 2013-11-25 LAB — RESPIRATORY CULTURE OR RESPIRATORY AND SPUTUM CULTURE: Organism ID, Bacteria: NORMAL

## 2013-11-25 LAB — CBC
HCT: 32.2 % — ABNORMAL LOW (ref 36.0–46.0)
Hemoglobin: 10.7 g/dL — ABNORMAL LOW (ref 12.0–15.0)
MCH: 30.5 pg (ref 26.0–34.0)
MCHC: 33.2 g/dL (ref 30.0–36.0)
MCV: 91.7 fL (ref 78.0–100.0)
Platelets: 153 10*3/uL (ref 150–400)
RBC: 3.51 MIL/uL — ABNORMAL LOW (ref 3.87–5.11)
RDW: 13.2 % (ref 11.5–15.5)
WBC: 3.8 10*3/uL — AB (ref 4.0–10.5)

## 2013-11-25 LAB — LEGIONELLA ANTIGEN, URINE: LEGIONELLA ANTIGEN, URINE: NEGATIVE

## 2013-11-25 MED ORDER — AZITHROMYCIN 250 MG PO TABS
250.0000 mg | ORAL_TABLET | Freq: Every day | ORAL | Status: DC
  Filled 2013-11-25: qty 1

## 2013-11-25 MED ORDER — AZITHROMYCIN 250 MG PO TABS
250.0000 mg | ORAL_TABLET | Freq: Every day | ORAL | Status: DC
  Administered 2013-11-25 – 2013-11-26 (×2): 250 mg via ORAL
  Filled 2013-11-25 (×2): qty 1

## 2013-11-25 MED ORDER — BENZONATATE 100 MG PO CAPS
200.0000 mg | ORAL_CAPSULE | Freq: Two times a day (BID) | ORAL | Status: DC
  Administered 2013-11-25 – 2013-11-26 (×3): 200 mg via ORAL
  Filled 2013-11-25 (×4): qty 2

## 2013-11-25 NOTE — Progress Notes (Signed)
Attending Addendum  I examined the patient and discussed the assessment and plan with Dr. Yong Channel. I have reviewed the note and agree. Patient improving from CAP. No O2 requirement or fever. Improvement in cough and chest pains. Eating breakfast this AM. Slept well with vicodin.  P:  D/c Levaquin and continue azithromycin today. Tessalon perles. Vicodin prn.  Anticipate d/c to home tomorrow.  Called over to [facility] to update patient's PCP. He is off today. I was given his cell # and called him with update. He agrees with care plan. He would like patient to be seen by pulmonology eventually given CT findings which is reasonable as she would benefit from PFTs and would need bronchoscopy if hemoptysis continues.  Patient already has outpatient pulmonology referral.  Of note, her primary doctor is doctor Art [name] upon discharge patient should be asked if she prefers to f/u with Dr. Nelva Nay at [facility] or at [facility] urgent care.   Boykin Nearing, [place]

## 2013-11-25 NOTE — Progress Notes (Signed)
Utilization review completed.  

## 2013-11-25 NOTE — Progress Notes (Signed)
Family Medicine Teaching Service Daily Progress Note Intern Pager: [phone]  Patient name: Caroline Snyder Medical record number: [id number] Date of birth: 02-21-1941 Age: 73 y.o. Gender: female  Primary Care Provider: Estill Dooms, MD Consultants: None Code Status: Full Code  Chief Complaint: Hemoptysis  Assessment and Plan: Caroline Snyder is a 73 y.o. female presenting with hemoptysis and found to have community-acquired pneumonia . PMH is significant for chronic cough.  # Community acquired pneumonia: developing infiltrate on CXR seen in right lobe. Neg urine strep pneumo.  Patient having symptoms of significant cough and associated pain with cough. No fevers.Due to chest pain for several days with cough, obtained troponin x 1 which was negative. EKG Normal sinus rhythm  Levoflaxacin 750mg  QOD and azithromycin started. Will d/c levaquin today and monitor overnight  Follow-up urine legionella   Vicodin PRN for pain and cough  Cardiac monitoring, O2 to keep O2 sats >92%, Continuous pulse ox  # Hemoptysis: Specks of blood only in sputum (greatly improved once starting treatment and improving hydration) Most likely related to recent worsening of cough. Hemoglobin stable around 11 x 2 days and likely also diluted by fluids.    Follow-up CBC in AM  Could consider ENT or Pulmonology consult outpatient if recurs  # Transaminitis: AST/ALT elevated from two days ago. Could be related to dehydration. Patient does not have a history of liver pathology. PT and INR normal (13 and 1.0 respectively).  CMET (had not been ordered today) tomorrow 4/27  to trend enzymes  # Hyperlipidemia  Continue statin  # Jerrye Bushy  Continue H2 blocker  # Chronic cough: patient has had this for a year. No unexplained weight loss. CT did not show any cavitary lesion. Could be atypical pneumonia vs GERD.  Continue treatment with H2 blocker and azithromycin  FEN/GI: hyponatremia resolved with  rehydration, likely due to acute dehydration from decreased PO, advance to regular diet today and SLIV, Zofran for nausea; Prophylaxis: SCDs  Disposition: likely discharge tomorrow if hgb remains stable and strength continues to improve  Subjective:  Few specks of blood when coughing much improved. Cough mainly with standing up. Pain much better controlled. Does have some mid back pain when coughing improved by vicodin.   Objective: Temp:  [97.4 F (36.3 C)-98.8 F (37.1 C)] 98.8 F (37.1 C) (04/26 0514) Pulse Rate:  [62-72] 70 (04/26 0514) Resp:  [16-18] 18 (04/26 0514) BP: (124-162)/(55-82) 124/72 mmHg (04/26 0514) SpO2:  [90 %-97 %] 90 % (04/26 0514) Weight:  [100 lb 4.8 oz (45.496 kg)-109 lb 15.1 oz (49.87 kg)] 109 lb 15.1 oz (49.87 kg) (04/25 2125) Physical Exam: General: Sitting up in bed comfortably, able to walk around room without difficulty HEENT: PERRL, EOMI, Moist mucous membranes (improved from yesterday) Cardiovascular: Regular rate and rhythm  Respiratory: Rhonchi present, no wheezing, no increased work of breathing  Abdomen: soft, non-tender, non-distended  Extremities: No calf tenderness, no erythema or swelling  Skin: No visible rashes or cyanosis  Neuro: Alert, oriented x3  Laboratory:  Recent Labs Lab 11/22/13 1531 11/24/13 0828 11/24/13 1200 11/25/13 0508  WBC 6.2 4.2* 4.5 3.8*  HGB 13.2 10.8* 11.1* 10.7*  HCT 39.2 34.7* 32.6* 32.2*  PLT 212  --  153 153    Recent Labs Lab 11/22/13 1531 11/22/13 1636 11/24/13 1200 11/25/13 0508  NA 139  --  129* 143  K 4.0  --  3.8 3.9  CL 99  --  94* 106  CO2 28  --  23 25  BUN 15  --  10 8  CREATININE 0.76  --  0.57 0.76  CALCIUM 10.0  --  8.5 7.9*  PROT  --  7.4 6.1  --   BILITOT  --  <0.2* <0.2*  --   ALKPHOS  --  63 99  --   ALT  --  45* 143*  --   AST  --  48* 172*  --   GLUCOSE 121*  --  158* 96   Imaging/Diagnostic Tests: 11/24/2013 Dg Chest 2 View IMPRESSION: Minimal opacity in the right  middle lobe both on the frontal and lateral view concerning for developing infiltrate.     Marin Olp, MD 11/25/2013, 7:24 AM PGY-3, [place] Intern pager: [phone], text pages welcome

## 2013-11-25 NOTE — Progress Notes (Signed)
Family Medicine Teaching Service Daily Progress Note Intern Pager: [phone]  Patient name: Caroline Snyder Medical record number: [id number] Date of birth: [birth year] Age: 73 y.o. Gender: female  Primary Care Provider: Estill Dooms, MD Consultants: None Code Status: Full Code  Chief Complaint: Hemoptysis  Assessment and Plan: Caroline Snyder is a 74 y.o. female presenting with hemoptysis and found to have community-acquired pneumonia . PMH is significant for chronic cough.  # Community acquired pneumonia: developing infiltrate on CXR seen in right lobe. Neg urine strep pneumo.  Patient having symptoms of significant cough and associated pain with cough. No fevers.Due to chest pain for several days with cough, obtained troponin x 1 which was negative. EKG Normal sinus rhythm  urine legionella negative  Vicodin PRN for pain and cough  Cardiac monitoring, O2 to keep O2 sats >92%, Continuous pulse ox  Continue azithromycin  # Hemoptysis: Specks of blood only in sputum (greatly improved once starting treatment and improving hydration) Most likely related to recent worsening of cough. Hemoglobin stable around 11 x 2 days and likely also diluted by fluids. Hemoglobin is stable at 11.4.  ENT referral made outpatient  # Transaminitis: AST/ALT elevated from two days ago. Could be related to dehydration. Patient does not have a history of liver pathology. PT and INR normal (13 and 1.0 respectively). AST/ALT decreasing  # Hyperlipidemia  Continue statin  # Gerd  Continue H2 blocker  # Chronic cough: patient has had this for a year. No unexplained weight loss. CT did not show any cavitary lesion. Could be atypical pneumonia vs GERD.  Continue treatment with H2 blocker and azithromycin  FEN/GI: hyponatremia resolved with rehydration, likely due to acute dehydration from decreased PO, advance to regular diet today and SLIV, Zofran for nausea; Prophylaxis: SCDs  Disposition:  discharge home today  Subjective:  Patient reports feeling much better. She is still coughing but pain is better controlled. She feels slightly weak today, but says she feels okay going home. No complaints overnight.   Objective: Temp:  [98.1 F (36.7 C)-99 F (37.2 C)] 98.1 F (36.7 C) (04/27 0907) Pulse Rate:  [64-72] 65 (04/27 0907) Resp:  [16-20] 16 (04/27 0907) BP: (99-132)/(61-76) 121/66 mmHg (04/27 0907) SpO2:  [94 %-100 %] 95 % (04/27 0907) Weight:  [104 lb 1.6 oz (47.219 kg)] 104 lb 1.6 oz (47.219 kg) (04/26 2118)  Physical Exam: General: Was walking around in her room, in no acute distress, pleasant, looks slightly fatigued Cardiovascular: Regular rate and rhythm  Respiratory: Clear to auscultation bilaterally, no wheezing  Abdomen: soft, non-tender, non-distended  Extremities: No calf tenderness, no erythema or swelling  Skin: No visible rashes or cyanosis  Neuro: Alert, oriented x3  Laboratory:  Recent Labs Lab 11/24/13 1200 11/25/13 0508 11/26/13 0606  WBC 4.5 3.8* 3.7*  HGB 11.1* 10.7* 11.4*  HCT 32.6* 32.2* 34.6*  PLT 153 153 167    Recent Labs Lab 11/22/13 1636 11/24/13 1200 11/25/13 0508 11/26/13 0606  NA  --  129* 143 142  K  --  3.8 3.9 4.1  CL  --  94* 106 103  CO2  --  23 25 28   BUN  --  10 8 10   CREATININE  --  0.57 0.76 0.79  CALCIUM  --  8.5 7.9* 8.7  PROT 7.4 6.1  --  5.9*  BILITOT <0.2* <0.2*  --  <0.2*  ALKPHOS 63 99  --  103  ALT 45* 143*  --  93*  AST  48* 172*  --  76*  GLUCOSE  --  158* 96 89   Imaging/Diagnostic Tests: 11/24/2013 Dg Chest 2 View IMPRESSION: Minimal opacity in the right middle lobe both on the frontal and lateral view concerning for developing infiltrate.     Cordelia Poche, MD 11/26/2013, 9:17 AM PGY-1, [place] Intern pager: [phone], text pages welcome

## 2013-11-26 DIAGNOSIS — R042 Hemoptysis: Secondary | ICD-10-CM | POA: Diagnosis not present

## 2013-11-26 DIAGNOSIS — R05 Cough: Secondary | ICD-10-CM | POA: Diagnosis not present

## 2013-11-26 DIAGNOSIS — J189 Pneumonia, unspecified organism: Secondary | ICD-10-CM | POA: Diagnosis not present

## 2013-11-26 LAB — COMPREHENSIVE METABOLIC PANEL
ALBUMIN: 2.7 g/dL — AB (ref 3.5–5.2)
ALK PHOS: 103 U/L (ref 39–117)
ALT: 93 U/L — AB (ref 0–35)
AST: 76 U/L — AB (ref 0–37)
BUN: 10 mg/dL (ref 6–23)
CHLORIDE: 103 meq/L (ref 96–112)
CO2: 28 meq/L (ref 19–32)
Calcium: 8.7 mg/dL (ref 8.4–10.5)
Creatinine, Ser: 0.79 mg/dL (ref 0.50–1.10)
GFR calc Af Amer: >90 mL/min (ref >90)
GFR, EST NON AFRICAN AMERICAN: 80 mL/min — AB (ref >90)
Glucose, Bld: 89 mg/dL (ref 70–99)
POTASSIUM: 4.1 meq/L (ref 3.7–5.3)
SODIUM: 142 meq/L (ref 137–147)
Total Protein: 5.9 g/dL — ABNORMAL LOW (ref 6.0–8.3)

## 2013-11-26 LAB — CBC
HCT: 34.6 % — ABNORMAL LOW (ref 36.0–46.0)
Hemoglobin: 11.4 g/dL — ABNORMAL LOW (ref 12.0–15.0)
MCH: 30.6 pg (ref 26.0–34.0)
MCHC: 32.9 g/dL (ref 30.0–36.0)
MCV: 93 fL (ref 78.0–100.0)
PLATELETS: 167 10*3/uL (ref 150–400)
RBC: 3.72 MIL/uL — AB (ref 3.87–5.11)
RDW: 13.2 % (ref 11.5–15.5)
WBC: 3.7 10*3/uL — ABNORMAL LOW (ref 4.0–10.5)

## 2013-11-26 MED ORDER — AZITHROMYCIN 250 MG PO TABS: 250.0000 mg | ORAL_TABLET | Freq: Every day | ORAL | Status: AC

## 2013-11-26 MED ORDER — BENZONATATE 200 MG PO CAPS: 200.0000 mg | ORAL_CAPSULE | Freq: Two times a day (BID) | ORAL | Status: DC

## 2013-11-26 MED ORDER — HYDROCODONE-ACETAMINOPHEN 5-325 MG PO TABS: 1.0000 | ORAL_TABLET | Freq: Four times a day (QID) | ORAL | Status: DC

## 2013-11-26 MED ORDER — ONDANSETRON HCL 4 MG PO TABS: 4.0000 mg | ORAL_TABLET | Freq: Four times a day (QID) | ORAL | Status: DC | PRN

## 2013-11-26 NOTE — Progress Notes (Signed)
FMTS ATTENDING  NOTE Silver Parkey,MD I  have seen and examined this patient, reviewed their chart. I have discussed this patient with the resident. I agree with the resident's findings, assessment and care plan.  Patient stated she feels a little off today compared to yesterday, her cough worsens a little, still having sputum production with occasional blood tinge, she denies any SOB,no chest pain. She feels weak today as well.  Filed Vitals:   11/25/13 1700 11/25/13 2118 11/26/13 0605 11/26/13 0907  BP: 99/61 132/76 123/62 121/66  Pulse: 65 68 70 65  Temp: 99 F (37.2 C) 98.7 F (37.1 C) 98.3 F (36.8 C) 98.1 F (36.7 C)  TempSrc: Oral Oral Oral Oral  Resp: 18 18 18 16   Height:  5' (1.524 m)    Weight:  104 lb 1.6 oz (47.219 kg)    SpO2: 95% 95% 94% 95%   Exam: Gen: Calm in bed, not in distress. HEENT: EOMI Resp: Air entry equal B/L with no wheezing or crepitations. CV: S1 S2 normal,no wheezing. Abd: Benign. Ext: No edema.  A/P: 73 y/o f with 1. CAP: On Zithromax, continue same.    Consider Cheratussin cough syrup if available, it might be useful to send her home on Cheratussin as well upon d/c.    Monitor O2 Sat.    If cough worsens as well as O2 sat we will consider re-imaging her.  2. Hemoptysis: Likely due to excessive coughing.     Monitor for improvement, if non to consider TB assessment.  3. Elevated liver enz: Improving. To consider outpatient work up including Hepatitis panel and U/S.  Please check resident's note for management of other chronic conditions.

## 2013-11-26 NOTE — Discharge Instructions (Signed)
Ms. Mckeag, you were admitted because of your recent history of bloody cough and evidence of pneumonia on chest x-ray. We gave you antibiotics which have helped you. Please continue to take these antibiotics as directed (first dose tomorrow, 4/28). Please follow-up with Dr. Everlene Farrier as soon as possible. We are sending you home with medications to help with your cough as well. Your PCP is setting up referral for "ear, nose and throat" doctors outside of the hospital.

## 2013-11-26 NOTE — Progress Notes (Signed)
Family Practice Teaching Service Interval Progress Note  Received page from nursing after the patient left the hospital that the patients son was calling from the pharmacy. Spoke to him over the phone and it seems as though there is a prior auth needed for the zofran and that the [place] are not covered by her insurance. They are able to get the azithromycin and vicodin filled. The son stated that they had tussionex at home and he felt he could use this for her cough. I advised that this was acceptable, though that they could not take this in addition to the vicodin tablets as both have hydrocodone in them. He voiced understanding and stated he would do the tussionex for cough and tylenol for pain. I reinforced that he should not give his mother both vicodin and tussionex and he confirmed understanding of this. Discussed the prior auth and advised that this could take some time to sort out, though we would do this as quickly as possible.  Tommi Rumps, MD Family Medicine PGY-2 Service Pager [phone]

## 2013-11-26 NOTE — Evaluation (Signed)
Physical Therapy Evaluation Patient Details Name: Caroline Snyder MRN: [id number] DOB: 06/13/1941 Today's Date: 11/26/2013   History of Present Illness  Adm 4/25 with hemoptysis. h/o chronic cough x 1 yr. recent back pain related to coughing.   Clinical Impression  Patient evaluated by Physical Therapy with no further acute PT needs identified. All education has been completed and the patient has no further questions.  See below for any follow-up Physial Therapy or equipment needs. PT is signing off. Thank you for this referral.     Follow Up Recommendations No PT follow up    Equipment Recommendations  None recommended by PT    Recommendations for Other Services       Precautions / Restrictions Precautions Precautions: None      Mobility  Bed Mobility Overal bed mobility: Independent                Transfers Overall transfer level: Independent Equipment used: None                Ambulation/Gait Ambulation/Gait assistance: Independent Ambulation Distance (Feet): 300 Feet Assistive device: None Gait Pattern/deviations: WFL(Within Functional Limits)        Stairs Stairs: Yes Stairs assistance: Modified independent (Device/Increase time) Stair Management: One rail Right;Alternating pattern;Forwards Number of Stairs: 8 General stair comments: no rail to ascend; light use of rail to descent  Wheelchair Mobility    Modified Rankin (Stroke Patients Only)       Balance Overall balance assessment: Independent                   Tandem Stance - Right Leg: 20   [place] - Eyes Opened: 30 [place] - Eyes Closed: 30 High level balance activites: Backward walking;Direction changes;Turns;Sudden stops;Head turns   Standardized Balance Assessment Standardized Balance Assessment : Dynamic Gait Index   Dynamic Gait Index Level Surface: Normal Change in Gait Speed: Normal Gait with Horizontal Head Turns: Normal Gait with Vertical Head Turns:  Normal Gait and Pivot Turn: Normal Step Over Obstacle: Normal Step Around Obstacles: Normal Steps: Mild Impairment Total Score: 23       Pertinent Vitals/Pain Denied pain    Home Living Family/patient expects to be discharged to:: Private residence Living Arrangements: Alone Available Help at Discharge: Family;Available PRN/intermittently (sister and children) Type of Home: Other(Comment) (condo) Home Access: Stairs to enter Entrance Stairs-Rails: None Entrance Stairs-Number of Steps: 1 Home Layout: One level Home Equipment: None      Prior Function Level of Independence: Independent         Comments: Fri and Sat night dancing     Hand Dominance        Extremity/Trunk Assessment   Upper Extremity Assessment: Overall WFL for tasks assessed           Lower Extremity Assessment: Overall WFL for tasks assessed      Cervical / Trunk Assessment: Normal  Communication   Communication: No difficulties  Cognition Arousal/Alertness: Awake/alert Behavior During Therapy: WFL for tasks assessed/performed Overall Cognitive Status: Within Functional Limits for tasks assessed                      General Comments General comments (skin integrity, edema, etc.): No staggering or drifting     Exercises        Assessment/Plan    PT Assessment Patent does not need any further PT services  PT Diagnosis     PT Problem List    PT Treatment Interventions  PT Goals (Current goals can be found in the Care Plan section) Acute Rehab PT Goals PT Goal Formulation: No goals set, d/c therapy    Frequency     Barriers to discharge        Co-evaluation               End of Session Equipment Utilized During Treatment: Gait belt Activity Tolerance: Patient tolerated treatment well Patient left: in chair;with call bell/phone within reach;with family/visitor present Nurse Communication: Mobility status;Other (comment) (OK to ambulate Independently; RN  agreed, pt informed)         Time: 1540-0867 PT Time Calculation (min): 14 min   Charges:   PT Evaluation $Initial PT Evaluation Tier I: 1 Procedure     PT G CodesJeanie Cooks [place] 01-Dec-2013, 11:15 AM Pager [phone]

## 2013-11-28 NOTE — Discharge Summary (Signed)
FMTS ATTENDING  NOTE Caroline Vanderloop,MD I  have seen and examined this patient, reviewed their chart. I have discussed this patient with the resident. I agree with the resident's findings, assessment and care plan. 

## 2013-11-28 NOTE — Discharge Summary (Signed)
[facility] Discharge Summary  Patient name: Caroline Snyder Medical record number: [id number] Date of birth: [birth year] Age: 73 y.o. Gender: female Date of Admission: 11/24/2013  Date of Discharge: 11/26/2013 Admitting Physician: Minerva Ends, MD  Primary Care Provider: Estill Dooms, MD Consultants: None  Indication for Hospitalization: Community acquired pneumonia  Discharge Diagnoses/Problem List:  1. Community acquired pneumonia 2. Hemoptysis 3. Elevated transaminases 4. Hyperlipidemia 5. GERD 6. Chronic cough  Disposition: Discharge home  Discharge Condition: Stable  Discharge Exam:  General: Was walking around in her room, in no acute distress, pleasant, looks slightly fatigued  Cardiovascular: Regular rate and rhythm  Respiratory: Clear to auscultation bilaterally, no wheezing  Abdomen: soft, non-tender, non-distended  Extremities: No calf tenderness, no erythema or swelling  Skin: No visible rashes or cyanosis  Neuro: Alert, oriented x3  Brief Hospital Course:   HPI: Caroline Snyder is a 73 y.o. female presenting with productive cough and hemoptysis  Went to see. Dr. Everlene Farrier on Thursday due to chronic cough with recent development of hemoptysis (mostly red at one point then faded) starting Wednesday night but had some yellow or green phlegm. Took some aleve and Mucinex DM starting on Wednesday with little relief. Complained of chest pain with cough but no shortness of breath. Denies fevers. Went to ED and they did a CT angio chest which did not show a cause of hemoptysis (did show mildly enlarged lymph nodes in right hilar and subcarinal region). Due to low back pain (worse with cough), they offered lumbar x-ray which patient declined. Declined evaluation for GU or rectal bleeding. No low plateelts or elevated PT/PTT/INR. Was referred to pulmonology.  Symptoms getting worse this morning/not improving so went back to urgent care. Mild  nausea today and vomited x1 (nonbilious/nonbloody). Hemoglobin noticed to be down to 11.1 from 13.2 two days ago. Patient direct admitted from urgent care for evaluation.   Community acquired pneumonia: chest x-ray showed concern for developing right sided pneumonia. Patient started on Levaquin for treatment and Vicodin for cough/pain. Tessalon also ordered for cough. Patient had improvement with treatment. She had no fevers and decreased coughing. Patient transitioned to azithromycin before discharge home. Physical therapy met with patient and cleared patient for home without PT services.  Hemoptysis: patient did not have any hemoptysis while admitted. Most likely related to recent worsening cough. Will be followed outpatient.  Elevated transaminases: thought to be due to dehydration. Elevated on admission, however, decreased on repeat check.  Hyperlipidemia: continued home statin  GERD: continued home H2 blocker  Chronic cough: history not concerning for TB. Most likely related to GERD vs atypical pneumonia. Patient continued on GERD treatment and on antibiotics for pneumonia.   Issues for Follow Up:  1. Follow-up of hemoptysis  Significant Procedures: None  Significant Labs and Imaging:   Recent Labs Lab 11/24/13 1200 11/25/13 0508 11/26/13 0606  WBC 4.5 3.8* 3.7*  HGB 11.1* 10.7* 11.4*  HCT 32.6* 32.2* 34.6*  PLT 153 153 167    Recent Labs Lab 11/22/13 1531 11/22/13 1636 11/24/13 1200 11/25/13 0508 11/26/13 0606  NA 139  --  129* 143 142  K 4.0  --  3.8 3.9 4.1  CL 99  --  94* 106 103  CO2 28  --  _0 GLUCOSE 121*  --  158* 96 89  BUN 15  --  _1 CREATININE 0.76  --  0.57 0.76 0.79  CALCIUM 10.0  --  8.5 7.9* 8.7  ALKPHOS  --  63 99  --  103  AST  --  48* 172*  --  76*  ALT  --  45* 143*  --  93*  ALBUMIN  --  3.9 3.0*  --  2.7*    Dg Chest 2 View  11/24/2013   CLINICAL DATA:  Hemoptysis  EXAM: CHEST  2 VIEW  COMPARISON:  CT ANGIO CHEST W/CM &/OR  WO/CM dated 11/22/2013; DG CHEST 2V dated 11/22/2013  FINDINGS: The heart size and vascular pattern are normal. Left lung is clear. No pleural effusions. Minimal opacity seen in the right middle lobe.  IMPRESSION: Minimal opacity in the right middle lobe both on the frontal and lateral view concerning for developing infiltrate.   Electronically Signed   By: Skipper Cliche M.D.   On: 11/24/2013 10:01    Results/Tests Pending at Time of Discharge: None  Discharge Medications:    Medication List    STOP taking these medications       chlorpheniramine-HYDROcodone 10-8 MG/5ML Lqcr  Commonly known as:  TUSSIONEX PENNKINETIC ER     naproxen sodium 220 MG tablet  Commonly known as:  ANAPROX      TAKE these medications       acetaminophen 500 MG tablet  Commonly known as:  TYLENOL  Take 1 tablet (500 mg total) by mouth every 4 (four) hours as needed for moderate pain.     ALPRAZolam 0.5 MG tablet  Commonly known as:  XANAX  Take 0.5 mg by mouth at bedtime as needed for sleep.     aspirin 81 MG tablet  Take 81 mg by mouth daily. Take 1 tablet daily to prevent heart attack, stroke and clotting.     azithromycin 250 MG tablet  Commonly known as:  ZITHROMAX  Take 1 tablet (250 mg total) by mouth daily.     benzonatate 200 MG capsule  Commonly known as:  TESSALON  Take 1 capsule (200 mg total) by mouth 2 (two) times daily.     CALCIUM + D PO  Take by mouth daily. Take 2 tablet daily for bone and muscle health.     fexofenadine-pseudoephedrine 180-240 MG per 24 hr tablet  Commonly known as:  ALLEGRA-D 24  Take 1 tablet by mouth daily. Take one tablet daily for allergies     fish oil-omega-3 fatty acids 1000 MG capsule  Take 1 g by mouth daily. Take 1 tablet daily.     HYDROcodone-acetaminophen 5-325 MG per tablet  Commonly known as:  NORCO/VICODIN  Take 1 tablet by mouth every 6 (six) hours.     multivitamin tablet  Take 1 tablet by mouth daily.     nizatidine 150 MG capsule   Commonly known as:  AXID  Take 150 mg by mouth 2 (two) times daily as needed. Take one tablet twice daily as needed to help stomach.     ondansetron 4 MG tablet  Commonly known as:  ZOFRAN  Take 1 tablet (4 mg total) by mouth every 6 (six) hours as needed for nausea.     pravastatin 40 MG tablet  Commonly known as:  PRAVACHOL  Take 40 mg by mouth daily.     VITAMIN C PO  Take by mouth daily.     VITAMIN D PO  Take by mouth daily. Take 1 tablet once daily for vitamin d supplement.     VITAMIN E PO  Take 1 capsule by mouth daily.  Discharge Instructions: Please refer to Patient Instructions section of EMR for full details.  Patient was counseled important signs and symptoms that should prompt return to medical care, changes in medications, dietary instructions, activity restrictions, and follow up appointments.   Follow-Up Appointments:     Follow-up Information   Follow up with DAUB, STEVE A, MD. Schedule an appointment as soon as possible for a visit in 1 week. (For hospital follow-up)    Specialty:  Family Medicine   Contact information:   [place] [address] [phone]       Cordelia Poche, MD 11/28/2013, 4:32 AM PGY-1, [place]

## 2013-11-30 ENCOUNTER — Ambulatory Visit (INDEPENDENT_AMBULATORY_CARE_PROVIDER_SITE_OTHER): Payer: Medicare Other | Admitting: Emergency Medicine

## 2013-11-30 ENCOUNTER — Ambulatory Visit: Payer: Medicare Other

## 2013-11-30 VITALS — BP 120/80 | HR 64 | Temp 98.1°F | Resp 16 | Ht 61.0 in | Wt 99.0 lb

## 2013-11-30 DIAGNOSIS — J189 Pneumonia, unspecified organism: Secondary | ICD-10-CM

## 2013-11-30 DIAGNOSIS — R05 Cough: Secondary | ICD-10-CM

## 2013-11-30 DIAGNOSIS — R059 Cough, unspecified: Secondary | ICD-10-CM

## 2013-11-30 DIAGNOSIS — R945 Abnormal results of liver function studies: Secondary | ICD-10-CM

## 2013-11-30 DIAGNOSIS — R7989 Other specified abnormal findings of blood chemistry: Secondary | ICD-10-CM

## 2013-11-30 DIAGNOSIS — R058 Other specified cough: Secondary | ICD-10-CM

## 2013-11-30 DIAGNOSIS — R112 Nausea with vomiting, unspecified: Secondary | ICD-10-CM | POA: Diagnosis not present

## 2013-11-30 DIAGNOSIS — R634 Abnormal weight loss: Secondary | ICD-10-CM

## 2013-11-30 LAB — HEPATITIS PANEL, ACUTE
HCV Ab: NEGATIVE
HEP A IGM: NONREACTIVE
Hep B C IgM: NONREACTIVE
Hepatitis B Surface Ag: NEGATIVE

## 2013-11-30 LAB — COMPREHENSIVE METABOLIC PANEL
ALT: 50 U/L — ABNORMAL HIGH (ref 0–35)
AST: 29 U/L (ref 0–37)
Albumin: 4.2 g/dL (ref 3.5–5.2)
Alkaline Phosphatase: 80 U/L (ref 39–117)
BILIRUBIN TOTAL: 0.4 mg/dL (ref 0.2–1.2)
BUN: 14 mg/dL (ref 6–23)
CHLORIDE: 102 meq/L (ref 96–112)
CO2: 28 meq/L (ref 19–32)
CREATININE: 0.76 mg/dL (ref 0.50–1.10)
Calcium: 9.5 mg/dL (ref 8.4–10.5)
Glucose, Bld: 76 mg/dL (ref 70–99)
Potassium: 4.5 mEq/L (ref 3.5–5.3)
Sodium: 140 mEq/L (ref 135–145)
TOTAL PROTEIN: 6.8 g/dL (ref 6.0–8.3)

## 2013-11-30 LAB — POCT CBC
GRANULOCYTE PERCENT: 69.6 % (ref 37–80)
HEMATOCRIT: 40.6 % (ref 37.7–47.9)
HEMOGLOBIN: 13.4 g/dL (ref 12.2–16.2)
Lymph, poc: 1.9 (ref 0.6–3.4)
MCH, POC: 30.9 pg (ref 27–31.2)
MCHC: 33 g/dL (ref 31.8–35.4)
MCV: 93.6 fL (ref 80–97)
MID (cbc): 0.4 (ref 0–0.9)
MPV: 8.5 fL (ref 0–99.8)
POC GRANULOCYTE: 5.4 (ref 2–6.9)
POC LYMPH PERCENT: 24.9 %L (ref 10–50)
POC MID %: 5.5 % (ref 0–12)
Platelet Count, POC: 450 10*3/uL — AB (ref 142–424)
RBC: 4.34 M/uL (ref 4.04–5.48)
RDW, POC: 13.1 %
WBC: 7.8 10*3/uL (ref 4.6–10.2)

## 2013-11-30 LAB — POCT URINALYSIS DIPSTICK
BILIRUBIN UA: NEGATIVE
Blood, UA: NEGATIVE
Glucose, UA: NEGATIVE
KETONES UA: NEGATIVE
LEUKOCYTES UA: NEGATIVE
NITRITE UA: NEGATIVE
PH UA: 7
PROTEIN UA: NEGATIVE
Spec Grav, UA: 1.01
Urobilinogen, UA: 0.2

## 2013-11-30 MED ORDER — BENZONATATE 200 MG PO CAPS: 200.0000 mg | ORAL_CAPSULE | Freq: Three times a day (TID) | ORAL | Status: DC | PRN

## 2013-11-30 NOTE — Progress Notes (Signed)
   Subjective:    Patient ID: Caroline Snyder, female    DOB: 05-Sep-1940, 73 y.o.   MRN: [id number]  HPI Pt presents following hospitalization.  Pt to follow up with pulmonologist 12/04/2013 with Dr. Melvyn Novas. Last dose of Zythromycin was Tuesday 11/27/2013.  Pt has never smoked - lived with 1st husband for 22 years who was a heavy smoker.   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      Review of Systems     Objective:   Physical Exam HEENT exam reveals a somewhat somnolent but cooperative patient in no distress. Her neck is supple  Chest sounds improved.  Continued productive cough.  Heart regular rate and rhythm Rectal exam reveals an impaction in the rectal vault. Skin exam reveals petechial lesions that are present most on the abdomen sparingly on the extremities  Results for orders placed in visit on 11/30/13  POCT CBC      Result Value Ref Range   WBC 7.8  4.6 - 10.2 K/uL   Lymph, poc 1.9  0.6 - 3.4   POC LYMPH PERCENT 24.9  10 - 50 %L   MID (cbc) 0.4  0 - 0.9   POC MID % 5.5  0 - 12 %M   POC Granulocyte 5.4  2 - 6.9   Granulocyte percent 69.6  37 - 80 %G   RBC 4.34  4.04 - 5.48 M/uL   Hemoglobin 13.4  12.2 - 16.2 g/dL   HCT, POC 40.6  37.7 - 47.9 %   MCV 93.6  80 - 97 fL   MCH, POC 30.9  27 - 31.2 pg   MCHC 33.0  31.8 - 35.4 g/dL   RDW, POC 13.1     Platelet Count, POC 450 (*) 142 - 424 K/uL   MPV 8.5  0 - 99.8 fL  POCT URINALYSIS DIPSTICK      Result Value Ref Range   Color, UA yellow     Clarity, UA clear     Glucose, UA neg     Bilirubin, UA neg     Ketones, UA neg     Spec Grav, UA 1.010     Blood, UA neg     pH, UA 7.0     Protein, UA neg     Urobilinogen, UA 0.2     Nitrite, UA neg     Leukocytes, UA Negative     [facility] reading (PRIMARY) by  Dr Everlene Farrier the previously seen infiltrate appears to have improved      Assessment & Plan:  Urine and labs look good. Her chest x-ray is improved. She has an appointment to see Dr.Wert on Tuesday the patient appears  chronically ill to me. I'm concerned about weight loss and the nodes seen on her chest x-ray. This all will need close followup.. PPD applied. Patient is scheduled for CT abdomen and pelvis. These results need to be forwarded to Dr. [name] who plans to see her in one week and followup

## 2013-12-02 ENCOUNTER — Ambulatory Visit (INDEPENDENT_AMBULATORY_CARE_PROVIDER_SITE_OTHER): Payer: Medicare Other | Admitting: Family Medicine

## 2013-12-02 DIAGNOSIS — Z111 Encounter for screening for respiratory tuberculosis: Secondary | ICD-10-CM

## 2013-12-02 LAB — TB SKIN TEST
Induration: 0 mm
TB SKIN TEST: NEGATIVE

## 2013-12-04 ENCOUNTER — Encounter: Payer: Self-pay | Admitting: Internal Medicine

## 2013-12-04 ENCOUNTER — Telehealth: Payer: Self-pay | Admitting: Internal Medicine

## 2013-12-04 ENCOUNTER — Ambulatory Visit (INDEPENDENT_AMBULATORY_CARE_PROVIDER_SITE_OTHER): Payer: Medicare Other | Admitting: Internal Medicine

## 2013-12-04 VITALS — BP 128/72 | HR 66 | Temp 98.0°F | Ht 60.0 in | Wt 98.2 lb

## 2013-12-04 DIAGNOSIS — R9389 Abnormal findings on diagnostic imaging of other specified body structures: Secondary | ICD-10-CM | POA: Diagnosis not present

## 2013-12-04 DIAGNOSIS — R059 Cough, unspecified: Secondary | ICD-10-CM | POA: Diagnosis not present

## 2013-12-04 DIAGNOSIS — R05 Cough: Secondary | ICD-10-CM | POA: Diagnosis not present

## 2013-12-04 MED ORDER — NIZATIDINE 150 MG PO CAPS: 150.0000 mg | ORAL_CAPSULE | Freq: Two times a day (BID) | ORAL | Status: DC | PRN

## 2013-12-04 NOTE — Telephone Encounter (Addendum)
Spoke with pt and daughter They were trying to figure out who ordered the new CT that is scheduled and why she was having another CT so close to her most recent one. I explained to the patient that her most recent CT was for her chest to f/u on her hemoptysis and this this new scan was unrelated. Ordered by Dr Everlene Farrier to view her abd/pelvis region d/t her recent abd pain and N/V. Pt was under the impression that Dr Everlene Farrier was doing a repeat scan of her lungs to recheck the hemoptysis. I explained again to the patient that they are two different scans and are unrelated.  Pt and daughter states that nothing further needed and will contact our office if anything further needed from [place].

## 2013-12-04 NOTE — Progress Notes (Signed)
Subjective:    Patient ID: Caroline Snyder, female    DOB: [birth year]  MRN: [id number]  HPI   59 yowf never smoker with new onset chronic  cough x around 2012 then  spring time nasal congestion/ runny nose x sev years after that assoc with some headaches then abruptly ill with severe cough x mid April 2015 leading to admit at [facility]    Date of Admission: 11/24/2013 Date of Discharge: 11/26/2013  Admitting Physician: Minerva Ends, MD  Primary Care Provider: Estill Dooms, MD  Consultants: None  Indication for Hospitalization: Community acquired pneumonia  Discharge Diagnoses/Problem List:  1. Community acquired pneumonia 2. Hemoptysis 3. Elevated transaminases 4. Hyperlipidemia 5. GERD 6. Chronic cough Disposition: Discharge home  Discharge Condition: Stable  Discharge Exam:  General: Was walking around in her room, in no acute distress, pleasant, looks slightly fatigued  Cardiovascular: Regular rate and rhythm  Respiratory: Clear to auscultation bilaterally, no wheezing  Abdomen: soft, non-tender, non-distended  Extremities: No calf tenderness, no erythema or swelling  Skin: No visible rashes or cyanosis  Neuro: Alert, oriented x3  Brief Hospital Course:  HPI: Caroline Snyder is a 73 y.o. female presenting with productive cough and hemoptysis  Went to see. Dr. Everlene Farrier on Thursday due to chronic cough with recent development of hemoptysis (mostly red at one point then faded) starting Wednesday night but had some yellow or green phlegm. Took some aleve and Mucinex DM starting on Wednesday with little relief. Complained of chest pain with cough but no shortness of breath. Denies fevers. Went to ED and they did a CT angio chest which did not show a cause of hemoptysis (did show mildly enlarged lymph nodes in right hilar and subcarinal region). Due to low back pain (worse with cough), they offered lumbar x-ray which patient declined. Declined evaluation for GU or rectal bleeding.  No low plateelts or elevated PT/PTT/INR. Was referred to pulmonology.  Symptoms getting worse this morning/not improving so went back to urgent care. Mild nausea today and vomited x1 (nonbilious/nonbloody). Hemoglobin noticed to be down to 11.1 from 13.2 two days ago. Patient direct admitted from urgent care for evaluation.  Community acquired pneumonia: chest x-ray showed concern for developing right sided pneumonia. Patient started on Levaquin for treatment and Vicodin for cough/pain. Tessalon also ordered for cough. Patient had improvement with treatment. She had no fevers and decreased coughing. Patient transitioned to azithromycin before discharge home. Physical therapy met with patient and cleared patient for home without PT services.  Hemoptysis: patient did not have any hemoptysis while admitted. Most likely related to recent worsening cough. Will be followed outpatient.  Elevated transaminases: thought to be due to dehydration. Elevated on admission, however, decreased on repeat check.  Hyperlipidemia: continued home statin  GERD: continued home H2 blocker  Chronic cough: history not concerning for TB. Most likely related to GERD vs atypical pneumonia. Patient continued on GERD treatment and on antibiotics for pneumonia.  11/27/13 last zmax rx  12/04/2013 1st [facility] office visit/ Orion Mole  Chief Complaint  Patient presents with  . Advice Only    Referred by Dr. Everlene Farrier for PNA    70 -90% better with now min hemoptysis and Not limited by breathing from desired activities   Never had epistaxis  No obvious other patterns in day to day or daytime variabilty or assoc sob  or cp or chest tightness, subjective wheeze overt   hb symptoms. No unusual exp hx or h/o childhood pna/  asthma or knowledge of premature birth.  Sleeping ok without nocturnal  or early am exacerbation  of respiratory  c/o's or need for noct saba. Also denies any obvious fluctuation of symptoms with weather or  environmental changes or other aggravating or alleviating factors except as outlined above   Current Medications, Allergies, Complete Past Medical History, Past Surgical History, Family History, and Social History were reviewed in [employer] record.              Review of Systems  Constitutional: Negative for fever and unexpected weight change.  HENT: Positive for congestion, postnasal drip and sinus pressure. Negative for dental problem, ear pain, nosebleeds, rhinorrhea, sneezing, sore throat and trouble swallowing.   Eyes: Negative for redness and itching.  Respiratory: Positive for cough and shortness of breath. Negative for chest tightness and wheezing.   Cardiovascular: Negative for palpitations and leg swelling.  Gastrointestinal: Positive for abdominal distention. Negative for nausea and vomiting.  Genitourinary: Negative for dysuria.  Musculoskeletal: Negative for joint swelling.  Skin: Negative for rash.  Neurological: Negative for headaches.  Hematological: Does not bruise/bleed easily.  Psychiatric/Behavioral: Positive for dysphoric mood. The patient is not nervous/anxious.        Objective:   Physical Exam   Wt Readings from Last 3 Encounters:  12/04/13 98 lb 3.2 oz (44.543 kg)  11/30/13 99 lb (44.906 kg)  11/25/13 104 lb 1.6 oz (47.219 kg)     HEENT: nl dentition, turbinates, and orophanx. Nl external ear canals without cough reflex   NECK :  without JVD/Nodes/TM/ nl carotid upstrokes bilaterally   LUNGS: no acc muscle use, clear to A and P bilaterally without cough on insp or exp maneuvers   CV:  RRR  no s3 or murmur or increase in P2, no edema   ABD:  soft and nontender with nl excursion in the supine position. No bruits or organomegaly, bowel sounds nl  MS:  warm without deformities, calf tenderness, cyanosis or clubbing  SKIN: warm and dry without lesions    NEURO:  alert, approp, no deficits   CT chest 11/22/13 1. No CT  evidence of pulmonary arterial embolic disease.  2. Enlarged lymph nodes in the right hilar and subcarinal region.  These may represent reactive lymph nodes, clinical correlation  recommended. There is otherwise no evidence of mediastinal or  parenchymal masses or nodules nor infiltrates.  3. Atelectasis versus scarring within the lung bases as well as  interstitial fibrotic changes.  cxr 11/30/13 Clearing of the left lung base. Almost complete resolution of the  focal pneumonia in the right middle lobe     Assessment & Plan:

## 2013-12-04 NOTE — Patient Instructions (Addendum)
Axid 150 mg after bfast and at bedtime until return   No aspirin until no bleeding whatsoever for 3 days   GERD (REFLUX)  is an extremely common cause of respiratory symptoms, many times with no significant heartburn at all.    It can be treated with medication, but also with lifestyle changes including avoidance of late meals, excessive alcohol, smoking cessation, and avoid fatty foods, chocolate, peppermint, colas, red wine, and acidic juices such as orange juice.  NO MINT OR MENTHOL PRODUCTS SO NO COUGH DROPS  USE SUGARLESS CANDY INSTEAD (jolley ranchers or Stover's)  NO OIL BASED VITAMINS - use powdered substitutes.  Please schedule a follow up office visit in 4 weeks, sooner if needed with cxr on return

## 2013-12-06 NOTE — Assessment & Plan Note (Signed)
The most common causes of chronic cough in immunocompetent adults include the following: upper airway cough syndrome (UACS), previously referred to as postnasal drip syndrome (PNDS), which is caused by variety of rhinosinus conditions; (2) asthma; (3) GERD; (4) chronic bronchitis from cigarette smoking or other inhaled environmental irritants; (5) nonasthmatic eosinophilic bronchitis; and (6) bronchiectasis.   These conditions, singly or in combination, have accounted for up to 94% of the causes of chronic cough in prospective studies.   Other conditions have constituted no >6% of the causes in prospective studies These have included bronchogenic carcinoma, chronic interstitial pneumonia, sarcoidosis, left ventricular failure, ACEI-induced cough, and aspiration from a condition associated with pharyngeal dysfunction.    Chronic cough is often simultaneously caused by more than one condition. A single cause has been found from 38 to 82% of the time, multiple causes from 18 to 62%. Multiply caused cough has been the result of three diseases up to 42% of the time.      She was on asa with acute worsening assoc clinically with pna with no bronchiectasis on CT so will approach conservatively by holding asa until no bleeding at all and placing on rx for gerd with axid bid plus diet/ reviewed.   F/u in 4 weeks with sinus ct if not better.

## 2013-12-06 NOTE — Assessment & Plan Note (Signed)
She is a never smoker though does have an unexplained chronic cough with recent hemoptyis, however, very unlikely this represents any form of cancer and would not repeat unless remains symptomatic.

## 2013-12-07 ENCOUNTER — Ambulatory Visit: Payer: Medicare Other | Admitting: Gynecology

## 2013-12-07 ENCOUNTER — Ambulatory Visit (INDEPENDENT_AMBULATORY_CARE_PROVIDER_SITE_OTHER): Payer: Medicare Other | Admitting: Family Medicine

## 2013-12-07 VITALS — BP 108/72 | HR 67 | Temp 98.7°F | Resp 18 | Ht 60.5 in | Wt 98.0 lb

## 2013-12-07 DIAGNOSIS — R059 Cough, unspecified: Secondary | ICD-10-CM

## 2013-12-07 DIAGNOSIS — R042 Hemoptysis: Secondary | ICD-10-CM | POA: Diagnosis not present

## 2013-12-07 DIAGNOSIS — I781 Nevus, non-neoplastic: Secondary | ICD-10-CM

## 2013-12-07 DIAGNOSIS — R7989 Other specified abnormal findings of blood chemistry: Secondary | ICD-10-CM

## 2013-12-07 DIAGNOSIS — R945 Abnormal results of liver function studies: Secondary | ICD-10-CM

## 2013-12-07 DIAGNOSIS — R634 Abnormal weight loss: Secondary | ICD-10-CM

## 2013-12-07 DIAGNOSIS — R05 Cough: Secondary | ICD-10-CM

## 2013-12-07 DIAGNOSIS — D1801 Hemangioma of skin and subcutaneous tissue: Secondary | ICD-10-CM

## 2013-12-07 NOTE — Progress Notes (Signed)
Subjective:  This chart was scribed for Wendie Agreste, MD by Ludger Nutting, ED Scribe. This patient was seen in room 3 and the patient's care was started 3:07 PM.    Patient ID: Caroline Snyder, female    DOB: [birth year], 73 y.o.   MRN: [id number]  HPI HPI Comments:  Caroline Snyder is a 73 y.o. female who presents to [facility] here for follow up, last seen by Dr. Everlene Farrier 1 week ago then seen by Dr. Melvyn Novas 3 days ago.   Problem 1: cough with hemoptysis noted at last office visit. She was treated for community acquired pneumonia and hospitalized April 25-27th. She was treated with Levaquin then transitioned to azithromycin for home treatment. She had CT chest on April 23rd that showed enlarged lymph nodes to right hilar and subcarinal region. Thought to be possible reactive lymph nodes. No masses or nodules. Also noticed to atelectasis vs scarring in the lung bases, and interstitial fibrotic changes. CXR was improving at the last visit with almost complete resolution of pneumonia in the right middle lobe. She was seen by pulmonologist 3 days ago whose noted was reviewed. Planned on holding Asprin until no bleeding. Rx for GERD and diet for GERD reviewed. multiple chronic cough causes listed. Planning on f/u in 4 weeks with pulmonary for CT scan of sinuses if not better. With the abnormal chest CT, note from pulmonary. Did not think this was forme of cancer, would not repeat chest CT unless symptomatic.   She has noticed blood streaked sputum over the last several days but states it was darker in color today. She states there is not blood present with each cough. She states this is gradually improving. She has stopped taking ASA for the last few days.   Problem 2: Weight loss and elevated liver function tests. Her AST and ALT improved when rechecked at last office visit. Only isolated ALT elevation of 50. She did have TB skin test which was negative 5 days ago. CT abdomen-pelvis was ordered but not yet  performed. Weight was 103 lb in March 2015, 98 lb today. However on chart review, weight was 98 lb in February 2015, also was 99 lb in July 2014.   Problem 3: Rash on stomach when seen by Dr. Everlene Farrier at last visit. She was diagnosed with cherry angiomas. Per previous CBC, hemoglobin was 11.4 transition to 13.4. Platelets at last office visit were 450.   Patient continues to have constant, gradually worsening rash to the bilateral arms and torso. She describes these areas as red and itching.    Patient Active Problem List   Diagnosis Date Noted  . Hemoptysis 11/24/2013  . Abnormal CT of the chest 11/24/2013  . Cervicalgia 08/21/2013  . Cough 08/04/2013  . GERD (gastroesophageal reflux disease)   . ALLERGIC RHINITIS DUE TO POLLEN 04/27/2010  . PERSONAL HISTORY OF COLONIC POLYPS 04/27/2010  . Unspecified essential hypertension 11/22/2007  . Depression 05/05/2007  . Hyperlipidemia 06/29/2006   Past Medical History  Diagnosis Date  . Allergy   . Depression 05/05/2007  . Hyperlipidemia 06/29/2006  . 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 Bunion 07/01/2010  . Unspecified essential hypertension 11/22/2007  . Osteoporosis, unspecified 04/15/2004  . Memory loss 04/15/2003  . Migraine without aura, without mention of intractable migraine without mention of status migrainosus 03/24/1999  . 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 External hemorrhoids without mention of complication 71/16/5790  . GERD (gastroesophageal reflux disease) 2000   Past Surgical  History  Procedure Laterality Date  . Vaginal hysterectomy  1980  . Eye surgery Bilateral 2010    cataract Dr. Bing Plume  . Colonoscopy  06-16-2007    Internal hemorrhoids,laxity of anal sphincter,polyp at 25cm form anal verge, tortuous sigmoid colon. Dr.Orr    Allergies  Allergen Reactions  . Vioxx [Rofecoxib] Nausea Only    Pt. Can't remember what reaction was but asks to leave on her profile.    Prior to Admission  medications   Medication Sig Start Date End Date Taking? Authorizing Provider  Ascorbic Acid (VITAMIN C PO) Take by mouth daily.   Yes Historical Provider, MD  Fexofenadine HCl (MUCINEX ALLERGY PO) Take by mouth.   Yes Historical Provider, MD  acetaminophen (TYLENOL) 500 MG tablet Take 1 tablet (500 mg total) by mouth every 4 (four) hours as needed for moderate pain. 11/22/13   Ephraim Hamburger, MD  ALPRAZolam Duanne Moron) 0.5 MG tablet Take 0.5 mg by mouth at bedtime as needed for sleep.    Historical Provider, MD  aspirin 81 MG tablet Take 81 mg by mouth daily. Take 1 tablet daily to prevent heart attack, stroke and clotting.    Historical Provider, MD  benzonatate (TESSALON) 200 MG capsule Take 1 capsule (200 mg total) by mouth 3 (three) times daily as needed for cough. 11/30/13   Darlyne Russian, MD  Calcium Carbonate-Vitamin D (CALCIUM + D PO) Take by mouth daily. Take 2 tablet daily for bone and muscle health.    Historical Provider, MD  Cholecalciferol (VITAMIN D PO) Take by mouth daily. Take 1 tablet once daily for vitamin d supplement.    Historical Provider, MD  dextromethorphan-guaiFENesin (MUCINEX DM) 30-600 MG per 12 hr tablet Take 1 tablet by mouth 2 (two) times daily.    Historical Provider, MD  fexofenadine-pseudoephedrine (ALLEGRA-D 24) 180-240 MG per 24 hr tablet Take 1 tablet by mouth daily. Take one tablet daily for allergies    Historical Provider, MD  Multiple Vitamin (MULTIVITAMIN) tablet Take 1 tablet by mouth daily.    Historical Provider, MD  nizatidine (AXID) 150 MG capsule Take 1 capsule (150 mg total) by mouth 2 (two) times daily as needed. Take one tablet twice daily as needed to help stomach. 12/04/13   Tanda Rockers, MD  ondansetron (ZOFRAN) 4 MG tablet Take 1 tablet (4 mg total) by mouth every 6 (six) hours as needed for nausea. 11/26/13   Cordelia Poche, MD  VITAMIN E PO Take 1 capsule by mouth daily.     Historical Provider, MD   History   Social History  . Marital Status:  Widowed    Spouse Name: N/A    Number of Children: N/A  . Years of Education: N/A   Occupational History  . Not on file.   Social History Main Topics  . Smoking status: Never Smoker   . Smokeless tobacco: Never Used  . Alcohol Use: No  . Drug Use: No  . Sexual Activity: Not on file   Other Topics Concern  . Not on file   Social History Narrative  . No narrative on file     Review of Systems  Constitutional: Positive for fatigue and unexpected weight change (loss). Negative for fever.  Respiratory: Positive for cough. Negative for shortness of breath.   Gastrointestinal: Negative for constipation, blood in stool and anal bleeding.  Skin: Positive for rash.       +itching       Objective:   Physical Exam  Vitals reviewed. Constitutional: She is oriented to person, place, and time. She appears well-developed and well-nourished. No distress.  HENT:  Head: Normocephalic and atraumatic.  Right Ear: Hearing, tympanic membrane, external ear and ear canal normal.  Left Ear: Hearing, tympanic membrane, external ear and ear canal normal.  Nose: Nose normal.  Mouth/Throat: Oropharynx is clear and moist. No oropharyngeal exudate.  Moist oral mucosa. Bilateral TM's are pearly gray.   Eyes: Conjunctivae and EOM are normal. Pupils are equal, round, and reactive to light.  Cardiovascular: Normal rate, regular rhythm, normal heart sounds and intact distal pulses.   No murmur heard. Pulmonary/Chest: Effort normal and breath sounds normal. No respiratory distress. She has no wheezes. She has no rhonchi. She has no rales. She exhibits no tenderness.  Abdominal: Soft. She exhibits no distension. There is no tenderness.  Neurological: She is alert and oriented to person, place, and time.  Skin: Skin is warm and dry. Rash noted.  Multiple scattered cherry angiomas across the anterior abdominal wall. Few scattered papules, one on left arm with excoriation and another on RUQ of abdomen. No  other rash or petechiae.   Psychiatric: She has a normal mood and affect. Her behavior is normal.      Filed Vitals:   12/07/13 1445  BP: 108/72  Pulse: 67  Temp: 98.7 F (37.1 C)  TempSrc: Oral  Resp: 18  Height: 5' 0.5" (1.537 m)  Weight: 98 lb (44.453 kg)  SpO2: 96%       Assessment & Plan:   Caroline Snyder is a 73 y.o. female Cough, Hemoptysis - resolving pneumonia at last ov. S/p pulmonary eval. Recent PPD negative.  Adjusted diet to lessen gerd triggers, started on Axid, and other causes of cough discussed. Now off ASA and lessening hemoptysis. Per eval with pulmonary, no further eval of lymphadenopathy on chest CT unless persistently symptomatic cough, then discussed sinus imaging.   Loss of weight, elevated LFTs - improved at last ov, with only slightly elevated ALT. reviewing her chart - weight last year not too much different than current and decreased po intake with recent illness may be contributory to weight change recently. symptomatically and clinically appears to be improving from recent illness. CT abd /pelvis pending.  Rash - abd wall, Cherry angiomas noted, and possible prior petechiae. ? From prior cough/illness, but off ASA now and most recent platelets ok. No other rash noted. reassurance provided at present, but if new rash presents, mucosal bleeding or other new sites of bleeding - rtc or er.   Recheck in 1 week, RTC/ER precautions.      Meds ordered this encounter  Medications  . Fexofenadine HCl (MUCINEX ALLERGY PO)    Sig: Take by mouth.  . Multiple Vitamins-Minerals (ICAPS) TABS    Sig: Take 1 tablet by mouth 2 (two) times daily.  . cycloSPORINE (RESTASIS) 0.05 % ophthalmic emulsion    Sig: Place 1 drop into both eyes 2 (two) times daily.   Patient Instructions  Your blood counts and liver tests were much improved. Keep the appointment for your cat scan of the abdomen.  Avoid foods that can worsen acid reflux as recommended by Dr. Everlene Farrier, and  continue medication prescribed by Dr. Melvyn Novas. If any spread of rash, or worsening of symptoms prior to follow with Dr. [name] next weekend, then return sooner.     I personally performed the services described in this documentation, which was scribed in my presence. The recorded information has been reviewed  and considered, and addended by me as needed.

## 2013-12-07 NOTE — Patient Instructions (Signed)
Your blood counts and liver tests were much improved. Keep the appointment for your cat scan of the abdomen.  Avoid foods that can worsen acid reflux as recommended by Dr. Everlene Farrier, and continue medication prescribed by Dr. Melvyn Novas. If any spread of rash, or worsening of symptoms prior to follow with Dr. [name] next weekend, then return sooner.

## 2013-12-10 ENCOUNTER — Ambulatory Visit
Admission: RE | Admit: 2013-12-10 | Discharge: 2013-12-10 | Disposition: A | Discharge status: Home / Self Care | Payer: Medicare Other | Source: Ambulatory Visit | Attending: Emergency Medicine | Admitting: Emergency Medicine

## 2013-12-10 DIAGNOSIS — R945 Abnormal results of liver function studies: Principal | ICD-10-CM

## 2013-12-10 DIAGNOSIS — K5730 Diverticulosis of large intestine without perforation or abscess without bleeding: Secondary | ICD-10-CM | POA: Diagnosis not present

## 2013-12-10 DIAGNOSIS — R112 Nausea with vomiting, unspecified: Secondary | ICD-10-CM

## 2013-12-10 DIAGNOSIS — R634 Abnormal weight loss: Secondary | ICD-10-CM

## 2013-12-10 DIAGNOSIS — R7989 Other specified abnormal findings of blood chemistry: Secondary | ICD-10-CM

## 2013-12-10 MED ORDER — IOHEXOL 300 MG/ML  SOLN
100.0000 mL | Freq: Once | INTRAMUSCULAR | Status: AC | PRN
  Administered 2013-12-10: 100 mL via INTRAVENOUS

## 2013-12-11 ENCOUNTER — Encounter: Payer: Self-pay | Admitting: Family Medicine

## 2013-12-15 ENCOUNTER — Ambulatory Visit (INDEPENDENT_AMBULATORY_CARE_PROVIDER_SITE_OTHER): Payer: Medicare Other | Admitting: Family Medicine

## 2013-12-15 VITALS — BP 130/72 | HR 75 | Temp 97.9°F | Resp 15 | Ht 59.0 in | Wt 97.0 lb

## 2013-12-15 DIAGNOSIS — R9389 Abnormal findings on diagnostic imaging of other specified body structures: Secondary | ICD-10-CM | POA: Diagnosis not present

## 2013-12-15 DIAGNOSIS — R042 Hemoptysis: Secondary | ICD-10-CM

## 2013-12-15 DIAGNOSIS — R05 Cough: Secondary | ICD-10-CM | POA: Diagnosis not present

## 2013-12-15 DIAGNOSIS — R059 Cough, unspecified: Secondary | ICD-10-CM | POA: Diagnosis not present

## 2013-12-15 DIAGNOSIS — R634 Abnormal weight loss: Secondary | ICD-10-CM | POA: Diagnosis not present

## 2013-12-15 NOTE — Patient Instructions (Signed)
Continue tessalon perles and mucinex if needed for cough.  Your lungs sound ok today. Continue Axid for possible heartburn cause of cough. Plan to follow up with Dr. Melvyn Novas in about 3 weeks if cough not improving.   There was a questionable abnormal area on the colon on your CT scan, but possibly normal area. From the report: "Probable focal peristalsis of the proximal transverse colon, but a subtle colonic lesion would be difficult to exclude. No definite  associated soft tissue mass is seen however". You had a colonoscopy with benign polyps in 2013, so it would be less likely a mass, but we will refer you back to Dr. Deatra Ina to discuss further evaluation of this area and to discuss weight loss.   Diverticula on CT scan seen on last colonoscopy, not infected - can discuss this with Dr. Deatra Ina.   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   Follow up with [place] in the next 2 weeks - Dr. Everlene Farrier or myself, sooner if worse.

## 2013-12-15 NOTE — Progress Notes (Signed)
Subjective:  This chart was scribed for [employer]. [name] MD, by Stacy Gardner, [facility] Scribe. The patient was seen in room and the patient's care was started at 11:51 AM.  Patient ID: Caroline Snyder, female    DOB: Jan 14, 1941, 73 y.o.   MRN: [id number]  HPI HPI Comments: Caroline Snyder is a 73 y.o. female who arrives to the [facility] complaining of cough with hemoptysis. The cough is sometimes worse at night. Pt had a spot of blood when coughing five days ago but denies having another episode of hemoptysis since. She generally has clear-yellow sputum produced by cough. Pt has a scattered rash across her abdomen that has not resolved since her last visit. Pt is eating more and her appetite has returned. Pt is concerned that she continues to lose weight despite of eating more and drinking ensure between meals. Pt lost a pound.  Denies fever and new rash.   Pt was treated with a C-PAP and hospitalized 4/25-4/25/2014.  She was also prescribed Levaquin followed by Azithromycin. Pt had a CT of her chest which noted enlarged lymph node suspected reactive. Status post, pulmonary evaluation and Axid for possible acid reflux. The PPD was negative. Pt's  pneumonia was improving at last office visit which was approximately one week ago.   Pt has loss of weight and elevated liver test. Suspected reaction with weight loss and a decrease PO intake due to illness. Elevated LFT were improving when recheck on 11/30/13. She had a CT scan of her abdomen and pelvis last week. Results as below: IMPRESSION:  1. No hepatic lesion is seen.  2. Probable focal peristalsis of the proximal transverse colon, but  a subtle colonic lesion would be difficult to exclude. No definite  associated soft tissue mass is seen however.  3. Rectosigmoid colon diverticula with diverticulosis.  4. 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  Pt is unsure of her last colonoscopy. Per records, her last colonoscopy was two years ago.  The colonoscopy performed by Dr. Deatra Ina, indicated a has a polyp on her sigmoid and ascending colon. The results also noted diverticula on the sigmoid colon. The pathologist came backs a tubular adenoma and a benign inflammatory polyp. Denies hematochezia , diarrhea, nausea, and vomiting.    Patient Active Problem List   Diagnosis Date Noted  . Hemoptysis 11/24/2013  . Abnormal CT of the chest 11/24/2013  . Cervicalgia 08/21/2013  . Cough 08/04/2013  . GERD (gastroesophageal reflux disease)   . ALLERGIC RHINITIS DUE TO POLLEN 04/27/2010  . PERSONAL HISTORY OF COLONIC POLYPS 04/27/2010  . Unspecified essential hypertension 11/22/2007  . Depression 05/05/2007  . Hyperlipidemia 06/29/2006   Past Medical History  Diagnosis Date  . Allergy   . Depression 05/05/2007  . Hyperlipidemia 06/29/2006  . 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 Bunion 07/01/2010  . Unspecified essential hypertension 11/22/2007  . Osteoporosis, unspecified 04/15/2004  . Memory loss 04/15/2003  . Migraine without aura, without mention of intractable migraine without mention of status migrainosus 03/24/1999  . 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 External hemorrhoids without mention of complication 35/32/9924  . GERD (gastroesophageal reflux disease) 2000   Past Surgical History  Procedure Laterality Date  . Vaginal hysterectomy  1980  . Eye surgery Bilateral 2010    cataract Dr. Bing Plume  . Colonoscopy  06-16-2007  Internal hemorrhoids,laxity of anal sphincter,polyp at 25cm form anal verge, tortuous sigmoid colon. Dr.Orr    Allergies  Allergen Reactions  . Vioxx [Rofecoxib] Nausea Only    Pt. Can't remember what reaction was but asks to leave on her profile.    Prior to Admission medications   Medication Sig Start Date End Date Taking? Authorizing Provider  benzonatate  (TESSALON) 200 MG capsule Take 1 capsule (200 mg total) by mouth 3 (three) times daily as needed for cough. 11/30/13  Yes Darlyne Russian, MD  cycloSPORINE (RESTASIS) 0.05 % ophthalmic emulsion Place 1 drop into both eyes 2 (two) times daily.   Yes Historical Provider, MD  nizatidine (AXID) 150 MG capsule Take 1 capsule (150 mg total) by mouth 2 (two) times daily as needed. Take one tablet twice daily as needed to help stomach. 12/04/13  Yes Tanda Rockers, MD  acetaminophen (TYLENOL) 500 MG tablet Take 1 tablet (500 mg total) by mouth every 4 (four) hours as needed for moderate pain. 11/22/13   Ephraim Hamburger, MD  ALPRAZolam Duanne Moron) 0.5 MG tablet Take 0.5 mg by mouth at bedtime as needed for sleep.    Historical Provider, MD  Ascorbic Acid (VITAMIN C PO) Take by mouth daily.    Historical Provider, MD  aspirin 81 MG tablet Take 81 mg by mouth daily. Take 1 tablet daily to prevent heart attack, stroke and clotting.    Historical Provider, MD  Calcium Carbonate-Vitamin D (CALCIUM + D PO) Take by mouth daily. Take 2 tablet daily for bone and muscle health.    Historical Provider, MD  Cholecalciferol (VITAMIN D PO) Take by mouth daily. Take 1 tablet once daily for vitamin d supplement.    Historical Provider, MD  dextromethorphan-guaiFENesin (MUCINEX DM) 30-600 MG per 12 hr tablet Take 1 tablet by mouth 2 (two) times daily.    Historical Provider, MD  Fexofenadine HCl (MUCINEX ALLERGY PO) Take by mouth.    Historical Provider, MD  fexofenadine-pseudoephedrine (ALLEGRA-D 24) 180-240 MG per 24 hr tablet Take 1 tablet by mouth daily. Take one tablet daily for allergies    Historical Provider, MD  Multiple Vitamin (MULTIVITAMIN) tablet Take 1 tablet by mouth daily.    Historical Provider, MD  Multiple Vitamins-Minerals (ICAPS) TABS Take 1 tablet by mouth 2 (two) times daily.    Historical Provider, MD  ondansetron (ZOFRAN) 4 MG tablet Take 1 tablet (4 mg total) by mouth every 6 (six) hours as needed for nausea.  11/26/13   Cordelia Poche, MD  VITAMIN E PO Take 1 capsule by mouth daily.     Historical Provider, MD   History   Social History  . Marital Status: Widowed    Spouse Name: N/A    Number of Children: N/A  . Years of Education: N/A   Occupational History  . Not on file.   Social History Main Topics  . Smoking status: Never Smoker   . Smokeless tobacco: Never Used  . Alcohol Use: No  . Drug Use: No  . Sexual Activity: Not on file   Other Topics Concern  . Not on file   Social History Narrative  . No narrative on file      Review of Systems  Constitutional: Positive for appetite change. Negative for fever.  Respiratory: Positive for cough.   Gastrointestinal: Negative for nausea, vomiting, diarrhea and blood in stool.  Skin: Negative for rash.       Objective:   Physical Exam  Vitals reviewed.  Constitutional: She is oriented to person, place, and time. She appears well-developed and well-nourished. No distress.  HENT:  Head: Normocephalic and atraumatic.  Right Ear: Hearing, tympanic membrane, external ear and ear canal normal.  Left Ear: Hearing, tympanic membrane, external ear and ear canal normal.  Nose: Nose normal. No epistaxis.  Mouth/Throat: Oropharynx is clear and moist. No oropharyngeal exudate.  Eyes: Conjunctivae and EOM are normal. Pupils are equal, round, and reactive to light.  Neck: Carotid bruit is not present.  No lymphadenopathy   Cardiovascular: Normal rate, regular rhythm, normal heart sounds and intact distal pulses.   No murmur heard. Pulmonary/Chest: Effort normal and breath sounds normal. No respiratory distress. She has no wheezes. She has no rhonchi.  Abdominal: Soft. She exhibits no pulsatile midline mass. There is no tenderness.  Neurological: She is alert and oriented to person, place, and time.  Skin: Skin is warm and dry.  Few scattered cherry angiomas on abdominal wall. No other rash.   Psychiatric: She has a normal mood and affect.  Her behavior is normal.     Filed Vitals:   12/15/13 1012  BP: 130/72  Pulse: 75  Temp: 97.9 F (36.6 C)  TempSrc: Oral  Resp: 15  Height: 4\' 11"  (1.499 m)  Weight: 97 lb (43.999 kg)  SpO2: 97%      Assessment & Plan:  Caroline Snyder is a 73 y.o. female  Cough, hemoptysis - improving with resolution of hemoptysis. Cont sx care, axid for reflux or LPR, and follow up with pulmonary in few weeks if not continuing to improve. rtc precautions.   Loss of weight - Plan: Ambulatory referral to Gastroenterology - 1 pound difference from last ov. Initial thought of decreased po, but better intake recently. TSH ok in January.  Possible colon abnormality on CT, but colonoscopy in 2013 reassuring with 2 benign polyps - will refer to GI, and follow up with myself or Dr. Everlene Farrier in next 2 weeks.   No orders of the defined types were placed in this encounter.   Patient Instructions  Continue tessalon perles and mucinex if needed for cough.  Your lungs sound ok today. Continue Axid for possible heartburn cause of cough. Plan to follow up with Dr. Melvyn Novas in about 3 weeks if cough not improving.   There was a questionable abnormal area on the colon on your CT scan, but possibly normal area. From the report: "Probable focal peristalsis of the proximal transverse colon, but a subtle colonic lesion would be difficult to exclude. No definite  associated soft tissue mass is seen however". You had a colonoscopy with benign polyps in 2013, so it would be less likely a mass, but we will refer you back to Dr. Deatra Ina to discuss further evaluation of this area and to discuss weight loss.   Diverticula on CT scan seen on last colonoscopy, not infected - can discuss this with Dr. Deatra Ina.   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   Follow up with [place] in the next 2 weeks - Dr. Everlene Farrier or myself, sooner if worse.      I personally performed the services described in this documentation, which was scribed in my presence. The recorded information has been reviewed and considered,  and addended by me as needed.

## 2013-12-19 ENCOUNTER — Other Ambulatory Visit: Payer: Medicare Other

## 2013-12-19 ENCOUNTER — Ambulatory Visit (INDEPENDENT_AMBULATORY_CARE_PROVIDER_SITE_OTHER): Payer: Medicare Other | Admitting: Gastroenterology

## 2013-12-19 ENCOUNTER — Encounter: Payer: Self-pay | Admitting: Gastroenterology

## 2013-12-19 VITALS — BP 122/68 | HR 72 | Ht 59.0 in | Wt 98.2 lb

## 2013-12-19 DIAGNOSIS — R9389 Abnormal findings on diagnostic imaging of other specified body structures: Secondary | ICD-10-CM

## 2013-12-19 DIAGNOSIS — R945 Abnormal results of liver function studies: Secondary | ICD-10-CM | POA: Insufficient documentation

## 2013-12-19 DIAGNOSIS — R935 Abnormal findings on diagnostic imaging of other abdominal regions, including retroperitoneum: Secondary | ICD-10-CM | POA: Diagnosis not present

## 2013-12-19 DIAGNOSIS — R634 Abnormal weight loss: Secondary | ICD-10-CM | POA: Diagnosis not present

## 2013-12-19 LAB — HEPATIC FUNCTION PANEL
ALK PHOS: 52 U/L (ref 39–117)
ALT: 21 U/L (ref 0–35)
AST: 26 U/L (ref 0–37)
Albumin: 4 g/dL (ref 3.5–5.2)
BILIRUBIN TOTAL: 0.5 mg/dL (ref 0.2–1.2)
Bilirubin, Direct: 0 mg/dL (ref 0.0–0.3)
Total Protein: 6.7 g/dL (ref 6.0–8.3)

## 2013-12-19 NOTE — Assessment & Plan Note (Signed)
The very mild abnormality in the proximal transverse colon seen at CT scan most likely is due to peristalsis.  In view of colonoscopy in 2013, neoplasm is very unlikely.  There is nothing by CT scan that would account for weight loss.  Recommendations #1 no further GI workup.

## 2013-12-19 NOTE — Assessment & Plan Note (Signed)
Interval weight loss can be explained by recent pneumonia.  Weight actually has not changed significantly.  Would continue to follow.

## 2013-12-19 NOTE — Patient Instructions (Signed)
Go to the basement for labs today 

## 2013-12-19 NOTE — Progress Notes (Signed)
_                                                                                                                History of Present Illness: 73 year old white female referred for evaluation of weight loss and abnormal CT.  Approximately a month ago she was treated for a bacterial pneumonia when she presented with hemoptysis and cough.  CT scan for weight loss in early May, which I reviewed, identified a very slight irregularity in the proximal transverse colon felt most likely secondary to peristalsis.  Adenomatous polyps were removed at 2013 colonoscopy.  In March her weight was 103.  Today it is 99.  Appetite is good.  Patient is feeling fairly well and only complains of mild, persistent coughing.  Mild LFTs were noted with transaminases up to twice normal.  Serologies for hepatitis A., B. and C. were negative.    Past Medical History  Diagnosis Date  . Allergy   . Depression 05/05/2007  . Hyperlipidemia 06/29/2006  . 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 Bunion 07/01/2010  . Unspecified essential hypertension 11/22/2007  . Osteoporosis, unspecified 04/15/2004  . Memory loss 04/15/2003  . Migraine without aura, without mention of intractable migraine without mention of status migrainosus 03/24/1999  . 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 External hemorrhoids without mention of complication 42/70/6237  . GERD (gastroesophageal reflux disease) 2000   Past Surgical History  Procedure Laterality Date  . Vaginal hysterectomy  1980  . Eye surgery Bilateral 2010    cataract Dr. Bing Plume  . Colonoscopy  06-16-2007    Internal hemorrhoids,laxity of anal sphincter,polyp at 25cm form anal verge, tortuous sigmoid colon. Dr.Orr    family history includes Alcohol abuse in her father; Alzheimer's disease in her mother; Diabetes in her mother; Heart disease in her brother and mother; Heart disease (age of onset: 64) in her son; Hypertension in her brother, brother,  brother, sister, and sister; Kidney disease in her father; Transient ischemic attack in her mother. There is no history of Colon cancer or Stomach cancer. Current Outpatient Prescriptions  Medication Sig Dispense Refill  . benzonatate (TESSALON) 200 MG capsule Take 1 capsule (200 mg total) by mouth 3 (three) times daily as needed for cough.  20 capsule  0  . dextromethorphan-guaiFENesin (MUCINEX DM) 30-600 MG per 12 hr tablet Take 1 tablet by mouth 2 (two) times daily.      Marland Kitchen acetaminophen (TYLENOL) 500 MG tablet Take 1 tablet (500 mg total) by mouth every 4 (four) hours as needed for moderate pain.  30 tablet  0  . ALPRAZolam (XANAX) 0.5 MG tablet Take 0.5 mg by mouth at bedtime as needed for sleep.      . Ascorbic Acid (VITAMIN C PO) Take by mouth daily.      Marland Kitchen aspirin 81 MG tablet Take 81 mg by mouth daily. Take 1 tablet daily to prevent heart attack, stroke and clotting.      . Calcium Carbonate-Vitamin D (CALCIUM + D PO)  Take by mouth daily. Take 2 tablet daily for bone and muscle health.      . Cholecalciferol (VITAMIN D PO) Take by mouth daily. Take 1 tablet once daily for vitamin d supplement.      . cycloSPORINE (RESTASIS) 0.05 % ophthalmic emulsion Place 1 drop into both eyes 2 (two) times daily.      Marland Kitchen Fexofenadine HCl (MUCINEX ALLERGY PO) Take by mouth.      . fexofenadine-pseudoephedrine (ALLEGRA-D 24) 180-240 MG per 24 hr tablet Take 1 tablet by mouth daily. Take one tablet daily for allergies      . Multiple Vitamin (MULTIVITAMIN) tablet Take 1 tablet by mouth daily.      . Multiple Vitamins-Minerals (ICAPS) TABS Take 1 tablet by mouth 2 (two) times daily.      . nizatidine (AXID) 150 MG capsule Take 1 capsule (150 mg total) by mouth 2 (two) times daily as needed. Take one tablet twice daily as needed to help stomach.  60 capsule  11  . ondansetron (ZOFRAN) 4 MG tablet Take 1 tablet (4 mg total) by mouth every 6 (six) hours as needed for nausea.  20 tablet  0  . VITAMIN E PO Take 1  capsule by mouth daily.        No current facility-administered medications for this visit.   Allergies as of 12/19/2013 - Review Complete 12/19/2013  Allergen Reaction Noted  . Vioxx [rofecoxib] Nausea Only 08/16/2001    reports that she has never smoked. She has never used smokeless tobacco. She reports that she does not drink alcohol or use illicit drugs.     Review of Systems: Pertinent positive and negative review of systems were noted in the above HPI section. All other review of systems were otherwise negative.  Vital signs were reviewed in today's medical record Physical Exam: General: Well developed , well nourished, no acute distress Skin: anicteric Head: Normocephalic and atraumatic Eyes:  sclerae anicteric, EOMI Ears: Normal auditory acuity Mouth: No deformity or lesions Neck: Supple, no masses or thyromegaly Lungs: Clear throughout to auscultation Heart: Regular rate and rhythm; no murmurs, rubs or bruits Abdomen: Soft, non tender and non distended. No masses, hepatosplenomegaly or hernias noted. Normal Bowel sounds Rectal:deferred Musculoskeletal: Symmetrical with no gross deformities  Skin: No lesions on visible extremities Pulses:  Normal pulses noted Extremities: No clubbing, cyanosis, edema or deformities noted Neurological: Alert oriented x 4, grossly nonfocal Cervical Nodes:  No significant cervical adenopathy Inguinal Nodes: No significant inguinal adenopathy Psychological:  Alert and cooperative. Normal mood and affect  See Assessment and Plan under Problem List

## 2013-12-19 NOTE — Assessment & Plan Note (Signed)
Very mild LFT abnormalities may be related to pravastatin.  Hepatitis serologies were negative.  Doubt underlying liver disease.  Recommendations #1 followup LFTs

## 2014-01-03 ENCOUNTER — Encounter: Payer: Self-pay | Admitting: Internal Medicine

## 2014-01-03 ENCOUNTER — Ambulatory Visit (INDEPENDENT_AMBULATORY_CARE_PROVIDER_SITE_OTHER): Payer: Medicare Other | Admitting: Internal Medicine

## 2014-01-03 ENCOUNTER — Telehealth: Payer: Self-pay | Admitting: Internal Medicine

## 2014-01-03 ENCOUNTER — Ambulatory Visit (INDEPENDENT_AMBULATORY_CARE_PROVIDER_SITE_OTHER)
Admission: RE | Admit: 2014-01-03 | Discharge: 2014-01-03 | Disposition: A | Discharge status: Home / Self Care | Payer: Medicare Other | Source: Ambulatory Visit | Attending: Internal Medicine | Admitting: Internal Medicine

## 2014-01-03 VITALS — BP 128/84 | HR 65 | Temp 97.7°F | Ht 60.0 in | Wt 99.0 lb

## 2014-01-03 DIAGNOSIS — R9389 Abnormal findings on diagnostic imaging of other specified body structures: Secondary | ICD-10-CM

## 2014-01-03 DIAGNOSIS — R059 Cough, unspecified: Secondary | ICD-10-CM | POA: Diagnosis not present

## 2014-01-03 DIAGNOSIS — J398 Other specified diseases of upper respiratory tract: Secondary | ICD-10-CM | POA: Diagnosis not present

## 2014-01-03 DIAGNOSIS — R05 Cough: Secondary | ICD-10-CM

## 2014-01-03 MED ORDER — PREDNISONE 10 MG PO TABS: ORAL_TABLET | ORAL | Status: DC

## 2014-01-03 MED ORDER — BENZONATATE 200 MG PO CAPS: 200.0000 mg | ORAL_CAPSULE | Freq: Three times a day (TID) | ORAL | Status: DC | PRN

## 2014-01-03 NOTE — Progress Notes (Signed)
Subjective:    Patient ID: Caroline Snyder, female    DOB: [birth year]  MRN: [id number]    Brief patient profile:  22 yowf never smoker with new onset chronic  cough x around 2012 then  spring time nasal congestion/ runny nose x sev years after that assoc with some headaches then abruptly ill with severe cough x mid April 2015 leading to admit at [facility]    History of Present Illness   Date of Admission: 11/24/2013 Date of Discharge: 11/26/2013   Primary Care Provider: Estill Dooms, MD  Consultants: None  Indication for Hospitalization: Community acquired pneumonia  Discharge Diagnoses/Problem List:  1. Community acquired pneumonia 2. Hemoptysis 3. Elevated transaminases 4. Hyperlipidemia 5. GERD 6. Chronic cough Disposition: Discharge home  Discharge Condition: Stable  Discharge Exam:  General: Was walking around in her room, in no acute distress, pleasant, looks slightly fatigued  Cardiovascular: Regular rate and rhythm  Respiratory: Clear to auscultation bilaterally, no wheezing  Abdomen: soft, non-tender, non-distended  Extremities: No calf tenderness, no erythema or swelling  Skin: No visible rashes or cyanosis  Neuro: Alert, oriented x3  Brief Hospital Course:  HPI: SAACHI Snyder is a 73 y.o. female presenting with productive cough and hemoptysis  Went to see. Dr. Everlene Farrier on Thursday due to chronic cough with recent development of hemoptysis (mostly red at one point then faded) starting Wednesday night but had some yellow or green phlegm. Took some aleve and Mucinex DM starting on Wednesday with little relief. Complained of chest pain with cough but no shortness of breath. Denies fevers. Went to ED and they did a CT angio chest which did not show a cause of hemoptysis (did show mildly enlarged lymph nodes in right hilar and subcarinal region). Due to low back pain (worse with cough), they offered lumbar x-ray which patient declined. Declined evaluation for GU or rectal  bleeding. No low plateelts or elevated PT/PTT/INR. Was referred to pulmonology.  Symptoms getting worse this morning/not improving so went back to urgent care. Mild nausea today and vomited x1 (nonbilious/nonbloody). Hemoglobin noticed to be down to 11.1 from 13.2 two days ago. Patient direct admitted from urgent care for evaluation.  Community acquired pneumonia: chest x-ray showed concern for developing right sided pneumonia. Patient started on Levaquin for treatment and Vicodin for cough/pain. Tessalon also ordered for cough. Patient had improvement with treatment. She had no fevers and decreased coughing. Patient transitioned to azithromycin before discharge home. Physical therapy met with patient and cleared patient for home without PT services.  Hemoptysis: patient did not have any hemoptysis while admitted. Most likely related to recent worsening cough. Will be followed outpatient.  Elevated transaminases: thought to be due to dehydration. Elevated on admission, however, decreased on repeat check.  Hyperlipidemia: continued home statin  GERD: continued home H2 blocker  Chronic cough: history not concerning for TB. Most likely related to GERD vs atypical pneumonia. Patient continued on GERD treatment and on antibiotics for pneumonia.  11/27/13 last zmax rx  12/04/2013 1st [facility] office visit/ Blima Jaimes  Daily cough x 2012  Chief Complaint  Patient presents with  . Advice Only    Referred by Dr. Everlene Farrier for PNA   70 -90% better with now min hemoptysis and Not limited by breathing from desired activities   Never had epistaxis rec Axid 150 mg after bfast and at bedtime until return  No aspirin until no bleeding whatsoever for 3 days  GERD diet     01/03/2014  f/u ov/Kareen Jefferys re: cough x 3 years daily at some point Chief Complaint  Patient presents with  . Followup with CXR    Pt states that her breathing has improved back to normal baseline. Cough has improved some, still has occ prod cough  with minimal clear sputum.     Not limited by breathing from desired activities  - no need for inhalers of any kind   Kouffman Reflux v Neurogenic Cough Differentiator Reflux Comments  Do you awaken from a sound sleep coughing violently?                            With trouble breathing? no   Do you have choking episodes when you cannot  Get enough air, gasping for air ?              no   Do you usually cough when you lie down into  The bed, or when you just lie down to rest ?                          Frequently  Not in am though   Do you usually cough after meals or eating?         no   Do you cough when (or after) you bend over?    No    GERD SCORE     Kouffman Reflux v Neurogenic Cough Differentiator Neurogenic   Do you more-or-less cough all day long? yes   Does change of temperature make you cough? Cool maybe   Does laughing or chuckling cause you to cough? no   Do fumes (perfume, automobile fumes, burned  Toast, etc.,) cause you to cough ?      Maybe diesel   Does speaking, singing, or talking on the phone cause you to cough   ?               yes   Neurogenic/Airway score      More Day than while sleeping, tends to be thick white mucus size of pea or bean = total amnt of mucus per day tessilon helps a lot when she has taken it before - does not recall rx with prednisone   No obvious other patters in day to day or daytime variabilty or assoc cp or chest tightness, subjective wheeze overt sinus or hb symptoms. No unusual exp hx or h/o childhood pna/ asthma or knowledge of premature birth.  Sleeping ok without nocturnal  or early am exacerbation  of respiratory  c/o's or need for noct saba. Also denies any obvious fluctuation of symptoms with weather or environmental changes or other aggravating or alleviating factors except as outlined above   Current Medications, Allergies, Complete Past Medical History, Past Surgical History, Family History, and Social History were reviewed in  [employer] record.  ROS  The following are not active complaints unless bolded sore throat, dysphagia, dental problems, itching, sneezing,  nasal congestion or excess/ purulent secretions, ear ache,   fever, chills, sweats, unintended wt loss, pleuritic or exertional cp, hemoptysis,  orthopnea pnd or leg swelling, presyncope, palpitations, heartburn, abdominal pain, anorexia, nausea, vomiting, diarrhea  or change in bowel or urinary habits, change in stools or urine, dysuria,hematuria,  rash, arthralgias, visual complaints, headache, numbness weakness or ataxia or problems with walking or coordination,  change in mood/affect or memory.  Objective:   Physical Exam  01/03/2014         99   Wt Readings from Last 3 Encounters:  12/04/13 98 lb 3.2 oz (44.543 kg)  11/30/13 99 lb (44.906 kg)  11/25/13 104 lb 1.6 oz (47.219 kg)     HEENT: nl dentition, turbinates, and orophanx. Nl external ear canals without cough reflex   NECK :  without JVD/Nodes/TM/ nl carotid upstrokes bilaterally   LUNGS: no acc muscle use, clear to A and P bilaterally without cough on insp or exp maneuvers   CV:  RRR  no s3 or murmur or increase in P2, no edema   ABD:  soft and nontender with nl excursion in the supine position. No bruits or organomegaly, bowel sounds nl  MS:  warm without deformities, calf tenderness, cyanosis or clubbing  SKIN: warm and dry without lesions    NEURO:  alert, approp, no deficits   CT chest 11/22/13 1. No CT evidence of pulmonary arterial embolic disease.  2. Enlarged lymph nodes in the right hilar and subcarinal region.  These may represent reactive lymph nodes, clinical correlation  recommended. There is otherwise no evidence of mediastinal or  parenchymal masses or nodules nor infiltrates.  3. Atelectasis versus scarring within the lung bases as well as  interstitial fibrotic changes.  CXR  01/03/2014 :  The heart size  and mediastinal contours are within normal limits.  Both lungs are clear. The visualized skeletal structures are  unremarkable. The lungs remain clear and slightly hyperaerated.  Somewhat prominent perihilar markings with peribronchial thickening  remain. The heart is mildly enlarged and stable. No bony abnormality  is seen.     Assessment & Plan:

## 2014-01-03 NOTE — Progress Notes (Signed)
Quick Note:  Spoke with pt and notified of results per Dr. Wert. Pt verbalized understanding and denied any questions.  ______ 

## 2014-01-03 NOTE — Progress Notes (Signed)
Quick Note:  lmtcb ______ 

## 2014-01-03 NOTE — Telephone Encounter (Signed)
Spoke with the pt and informed of results  Nothing further needed

## 2014-01-03 NOTE — Patient Instructions (Addendum)
Change tessilon to 200 mg every 4 hours during the day to completely suppress the urge to cough and clear your throat  Prednisone 10 mg take  4 each am x 2 days,   2 each am x 2 days,  1 each am x 2 days and stop   Stay on axid twice daily after meals consistently   Please schedule a follow up office visit in 6 weeks, call sooner if needed  Late add :  If still coughing next step repeat chest Ct and also do sinus ct

## 2014-01-04 DIAGNOSIS — L821 Other seborrheic keratosis: Secondary | ICD-10-CM | POA: Diagnosis not present

## 2014-01-04 DIAGNOSIS — L819 Disorder of pigmentation, unspecified: Secondary | ICD-10-CM | POA: Diagnosis not present

## 2014-01-04 DIAGNOSIS — D2370 Other benign neoplasm of skin of unspecified lower limb, including hip: Secondary | ICD-10-CM | POA: Diagnosis not present

## 2014-01-04 DIAGNOSIS — D1801 Hemangioma of skin and subcutaneous tissue: Secondary | ICD-10-CM | POA: Diagnosis not present

## 2014-01-04 NOTE — Assessment & Plan Note (Signed)
CXR ok so most likely the CT findings from 11/22/13 represent acute infection and no further ct's needed unless symptoms persist in which case CT chest and sinus needed

## 2014-01-04 NOTE — Assessment & Plan Note (Signed)
-   onset March 2012  Not clear as to cause but limited ddx and has not tried short course prednisone yet plus cough suppression to eliminate cyclical cough from ddx  Reviewed with pt  The standardized cough guidelines published in Chest by Lissa Morales in 2006 are still the best available and consist of a multiple step process (up to 12!) , not a single office visit,  and are intended  to address this problem logically,  with an alogrithm dependent on response to empiric treatment at  each progressive step  to determine a specific diagnosis with  minimal addtional testing needed. Therefore if adherence is an issue or can't be accurately verified,  it's very unlikely the standard evaluation and treatment will be successful here.    Furthermore, response to therapy (other than acute cough suppression, which should only be used short term with avoidance of narcotic containing cough syrups if possible), can be a gradual process for which the patient may perceive immediate benefit.  Unlike going to an eye doctor where the best perscription is almost always the first one and is immediately effective, this is almost never the case in the management of chronic cough syndromes. Therefore the patient needs to commit up front to consistently adhere to recommendations  for up to 6 weeks of therapy directed at the likely underlying problem(s) before the response can be reasonably evaluated.    Will regroup in 4 weeks p short course pred, max axid, suppress cough with tessilong

## 2014-01-10 ENCOUNTER — Encounter: Payer: Self-pay | Admitting: Gynecology

## 2014-01-10 ENCOUNTER — Ambulatory Visit (INDEPENDENT_AMBULATORY_CARE_PROVIDER_SITE_OTHER): Payer: Medicare Other | Admitting: Gynecology

## 2014-01-10 VITALS — BP 140/88 | Ht 60.0 in | Wt 97.8 lb

## 2014-01-10 DIAGNOSIS — M810 Age-related osteoporosis without current pathological fracture: Secondary | ICD-10-CM

## 2014-01-10 DIAGNOSIS — Z8619 Personal history of other infectious and parasitic diseases: Secondary | ICD-10-CM

## 2014-01-10 DIAGNOSIS — N952 Postmenopausal atrophic vaginitis: Secondary | ICD-10-CM | POA: Diagnosis not present

## 2014-01-10 DIAGNOSIS — N9089 Other specified noninflammatory disorders of vulva and perineum: Secondary | ICD-10-CM | POA: Diagnosis not present

## 2014-01-10 DIAGNOSIS — Z79890 Hormone replacement therapy (postmenopausal): Secondary | ICD-10-CM

## 2014-01-10 MED ORDER — NONFORMULARY OR COMPOUNDED ITEM: Status: DC

## 2014-01-10 NOTE — Progress Notes (Signed)
Caroline Snyder June 15, 1941 [id number]   History:    73 y.o. who is a new patient to the practice. Patient was formerly followed by Dr. Ubaldo Snyder. Review of patient's records indicated that she has had past history of osteoporosis and had been on Fosamax 70 mg weekly from 2005-2010. She is on a drug called A. Last bone density study was in 2012 dural femur T score of -2.2 right femoral neck -2.0. Patient is taking calcium and vitamin D. Patient with past history of abdominal hysterectomy with bilateral salpingo-oophorectomy for benign indications. Patient with no prior history of abnormal Pap smear. Patient's PCP is Dr. Nyoka Snyder who has been doing her blood work. Patient with past history of HSV. Patient complaining of vaginal dryness and irritation since she became sexually active once again.  Patient had a colonoscopy in 2013 whereby polyps were removed. Last mammogram 2013.  Past medical history,surgical history, family history and social history were all reviewed and documented in the EPIC chart.  Gynecologic History No LMP recorded. Patient has had a hysterectomy. Contraception: status post hysterectomy Last Pap: 2011. Results were: normal Last mammogram: 2013. Results were: normal  Obstetric History OB History  Gravida Para Term Preterm AB SAB TAB Ectopic Multiple Living  2 2        2     # Outcome Date GA Lbr Len/2nd Weight Sex Delivery Anes PTL Lv  2 PAR     M SVD   Y  1 PAR     M SVD   Y       ROS: A ROS was performed and pertinent positives and negatives are included in the history.  GENERAL: No fevers or chills. HEENT: No change in vision, no earache, sore throat or sinus congestion. NECK: No pain or stiffness. CARDIOVASCULAR: No chest pain or pressure. No palpitations. PULMONARY: No shortness of breath, cough or wheeze. GASTROINTESTINAL: No abdominal pain, nausea, vomiting or diarrhea, melena or bright red blood per rectum. GENITOURINARY: No urinary frequency, urgency, hesitancy  or dysuria. MUSCULOSKELETAL: No joint or muscle pain, no back pain, no recent trauma. DERMATOLOGIC: No rash, no itching, no lesions. ENDOCRINE: No polyuria, polydipsia, no heat or cold intolerance. No recent change in weight. HEMATOLOGICAL: No anemia or easy bruising or bleeding. NEUROLOGIC: No headache, seizures, numbness, tingling or weakness. PSYCHIATRIC: No depression, no loss of interest in normal activity or change in sleep pattern.     Exam: chaperone present  BP 140/88  Ht 5' (1.524 m)  Wt 97 lb 12.8 oz (44.362 kg)  BMI 19.10 kg/m2  Body mass index is 19.1 kg/(m^2).  General appearance : Well developed well nourished female. No acute distress HEENT: Neck supple, trachea midline, no carotid bruits, no thyroidmegaly Lungs: Clear to auscultation, no rhonchi or wheezes, or rib retractions  Heart: Regular rate and rhythm, no murmurs or gallops Breast:Examined in sitting and supine position were symmetrical in appearance, no palpable masses or tenderness,  no skin retraction, no nipple inversion, no nipple discharge, no skin discoloration, no axillary or supraclavicular lymphadenopathy Abdomen: no palpable masses or tenderness, no rebound or guarding Extremities: no edema or skin discoloration or tenderness  Pelvic:  Bartholin, Urethra, Skene Glands: Within normal limits             Vagina: No gross lesions or discharge, vaginal atrophy  Cervix: Absent  Uterus absent At Adnexa  Without masses or tenderness  Anus and perineum  normal   Rectovaginal  normal sphincter tone without palpated masses or  tenderness             Hemoccult PCP we'll provide     Assessment/Plan:  73 y.o. female who is currently on a drug holiday. Patient was reminded to stay with her calcium and vitamin D and regular exercise. Patient with past history of osteoporosis improved after 5 years of Fosamax. She will be scheduled for bone density study here in our office in the next few weeks. She was reminded to  schedule her mammogram and this will probably be her last tissue will be 73 years old and exterior. Pap smear not done today in accordance to new guidelines. PCP will be drawn her blood work. Patient's immunizations are up-to-date.  Note: This dictation was prepared with  Dragon/digital dictation along withSmart phrase technology. Any transcriptional errors that result from this process are unintentional.   Terrance Mass MD, 10:44 AM 01/10/2014

## 2014-01-10 NOTE — Patient Instructions (Signed)
Bone Densitometry Bone densitometry is a special X-ray that measures your bone density and can be used to help predict your risk of bone fractures. This test is used to determine bone mineral content and density to diagnose osteoporosis. Osteoporosis is the loss of bone that may cause the bone to become weak. Osteoporosis commonly occurs in women entering menopause. However, it may be found in men and in people with other diseases. PREPARATION FOR TEST No preparation necessary. WHO SHOULD BE TESTED?  All women older than 65.  Postmenopausal women (50 to 65) with risk factors for osteoporosis.  People with a previous fracture caused by normal activities.  People with a small body frame (less than 127 poundsor a body mass index [BMI] of less than 21).  People who have a parent with a hip fracture or history of osteoporosis.  People who smoke.  People who have rheumatoid arthritis.  Anyone who engages in excessive alcohol use (more than 3 drinks most days).  Women who experience early menopause. WHEN SHOULD YOU BE RETESTED? Current guidelines suggest that you should wait at least 2 years before doing a bone density test again if your first test was normal.Recent studies indicated that women with normal bone density may be able to wait a few years before needing to repeat a bone density test. You should discuss this with your caregiver.  NORMAL FINDINGS   Normal: less than standard deviation below normal (greater than -1).  Osteopenia: 1 to 2.5 standard deviations below normal (-1 to -2.5).  Osteoporosis: greater than 2.5 standard deviations below normal (less than -2.5). Test results are reported as a "T score" and a "Z score."The T score is a number that compares your bone density with the bone density of healthy, young women.The Z score is a number that compares your bone density with the scores of women who are the same age, gender, and race.  Ranges for normal findings may vary  among different laboratories and hospitals. You should always check with your doctor after having lab work or other tests done to discuss the meaning of your test results and whether your values are considered within normal limits. MEANING OF TEST  Your caregiver will go over the test results with you and discuss the importance and meaning of your results, as well as treatment options and the need for additional tests if necessary. OBTAINING THE TEST RESULTS It is your responsibility to obtain your test results. Ask the lab or department performing the test when and how you will get your results. Document Released: 08/10/2004 Document Revised: 10/11/2011 Document Reviewed: 09/02/2010 Exit[employer] Patient Information 2014 [employer].  

## 2014-01-17 ENCOUNTER — Other Ambulatory Visit: Payer: Self-pay | Admitting: Gynecology

## 2014-01-17 ENCOUNTER — Ambulatory Visit (INDEPENDENT_AMBULATORY_CARE_PROVIDER_SITE_OTHER): Payer: Medicare Other

## 2014-01-17 DIAGNOSIS — M810 Age-related osteoporosis without current pathological fracture: Secondary | ICD-10-CM | POA: Diagnosis not present

## 2014-01-17 LAB — HM DEXA SCAN

## 2014-01-25 ENCOUNTER — Telehealth: Payer: Self-pay

## 2014-01-25 ENCOUNTER — Ambulatory Visit (INDEPENDENT_AMBULATORY_CARE_PROVIDER_SITE_OTHER): Payer: Medicare Other | Admitting: Emergency Medicine

## 2014-01-25 VITALS — BP 122/84 | HR 69 | Temp 97.6°F | Resp 16 | Ht 59.0 in | Wt 97.8 lb

## 2014-01-25 DIAGNOSIS — R059 Cough, unspecified: Secondary | ICD-10-CM

## 2014-01-25 DIAGNOSIS — R9389 Abnormal findings on diagnostic imaging of other specified body structures: Secondary | ICD-10-CM | POA: Diagnosis not present

## 2014-01-25 DIAGNOSIS — R0982 Postnasal drip: Secondary | ICD-10-CM | POA: Diagnosis not present

## 2014-01-25 DIAGNOSIS — R05 Cough: Secondary | ICD-10-CM

## 2014-01-25 MED ORDER — ALPRAZOLAM 0.5 MG PO TABS: ORAL_TABLET | ORAL | Status: DC

## 2014-01-25 NOTE — Progress Notes (Signed)
Subjective:  This chart was scribed for Arlyss Queen, MD by Mercy Moore, Medial Scribe. This patient was seen in room 9 and the patient's care was started at 12:27 PM.    Patient ID: Caroline Snyder, female    DOB: Aug 25, 1940, 73 y.o.   MRN: [id number]  HPI HPI Comments: Caroline Snyder is a 73 y.o. female who presents to the [facility] for follow up for her cough. Patient reports continued productive cough, with some yellow sputum, but denies hemoptysis. Patient suspects that she may have pneumonia, but states that she otherwise feels healthy. Patient denies history of tobacco use; however her first husband was an avid smoker. Patient has not been exposed to second hand smoke within the last seven years.  Patient reports absence of appetite and says that she hasn't been eating well lately; she is still losing weight.  Patient desires medication refill for her Xanax.   Patient Active Problem List   Diagnosis Date Noted  . 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 Osteoporosis, unspecified 01/10/2014  . Vaginal atrophy 01/10/2014  . Abnormal CT of the abdomen 12/19/2013  . Nonspecific abnormal results of liver function study 12/19/2013  . Loss of weight 12/19/2013  . Hemoptysis 11/24/2013  . Abnormal CT of the chest 11/24/2013  . Cervicalgia 08/21/2013  . Cough 08/04/2013  . GERD (gastroesophageal reflux disease)   . ALLERGIC RHINITIS DUE TO POLLEN 04/27/2010  . PERSONAL HISTORY OF COLONIC POLYPS 04/27/2010  . Unspecified essential hypertension 11/22/2007  . Depression 05/05/2007  . Hyperlipidemia 06/29/2006   Past Medical History  Diagnosis Date  . Allergy   . Depression 05/05/2007  . Hyperlipidemia 06/29/2006  . 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 Bunion 07/01/2010  . Unspecified essential hypertension 11/22/2007  . Osteoporosis, unspecified 04/15/2004  . Memory loss 04/15/2003  . Migraine without aura, without mention of intractable migraine  without mention of status migrainosus 03/24/1999  . 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 External hemorrhoids without mention of complication 00/17/4944  . GERD (gastroesophageal reflux disease) 2000   Past Surgical History  Procedure Laterality Date  . Vaginal hysterectomy  1980  . Eye surgery Bilateral 2010    cataract Dr. Bing Plume  . Colonoscopy  06-16-2007    Internal hemorrhoids,laxity of anal sphincter,polyp at 25cm form anal verge, tortuous sigmoid colon. Dr.Orr    Allergies  Allergen Reactions  . Vioxx [Rofecoxib] Nausea Only    Pt. Can't remember what reaction was but asks to leave on her profile.    Prior to Admission medications   Medication Sig Start Date End Date Taking? Authorizing Provider  acetaminophen (TYLENOL) 500 MG tablet Take 1 tablet (500 mg total) by mouth every 4 (four) hours as needed for moderate pain. 11/22/13  Yes Ephraim Hamburger, MD  acyclovir (ZOVIRAX) 200 MG capsule Take 1 capsule by mouth as needed. 01/01/14  Yes Historical Provider, MD  ALPRAZolam Duanne Moron) 0.5 MG tablet Take 0.5 mg by mouth at bedtime as needed for sleep.   Yes Historical Provider, MD  aspirin 81 MG tablet Take 81 mg by mouth daily. Take 1 tablet daily to prevent heart attack, stroke and clotting.   Yes Historical Provider, MD  benzonatate (TESSALON) 200 MG capsule Take 1 capsule (200 mg total) by mouth 3 (three) times daily as needed for cough. 01/03/14  Yes Tanda Rockers, MD  Multiple Vitamins-Minerals (ICAPS) TABS Take 1 tablet by mouth 2 (two) times daily.   Yes Historical  Provider, MD  nizatidine (AXID) 150 MG capsule Take 1 capsule (150 mg total) by mouth 2 (two) times daily as needed. Take one tablet twice daily as needed to help stomach. 12/04/13  Yes Tanda Rockers, MD  NONFORMULARY OR COMPOUNDED ITEM Estradiol .02% 1 ML Prefilled Applicator Sig: apply vaginally twice a week #90 Day Supply with 4 refills 01/10/14  Yes Terrance Mass, MD  ondansetron (ZOFRAN) 4 MG tablet  Take 1 tablet (4 mg total) by mouth every 6 (six) hours as needed for nausea. 11/26/13  Yes Cordelia Poche, MD  predniSONE (DELTASONE) 10 MG tablet Take  4 each am x 2 days,   2 each am x 2 days,  1 each am x 2 days and stop 01/03/14  Yes Tanda Rockers, MD   History   Social History  . Marital Status: Widowed    Spouse Name: N/A    Number of Children: 2  . Years of Education: N/A   Occupational History  . Retired    Social History Main Topics  . Smoking status: Never Smoker   . Smokeless tobacco: Never Used  . Alcohol Use: No  . Drug Use: No  . Sexual Activity: Not Currently   Other Topics Concern  . Not on file   Social History Narrative  . No narrative on file      Review of Systems  Constitutional: Negative for fever and chills.  HENT: Positive for postnasal drip.   Respiratory: Positive for cough. Negative for shortness of breath.   Cardiovascular: Negative for chest pain.       Objective:   Physical Exam  Nursing note and vitals reviewed.   CONSTITUTIONAL: Well developed/well nourished HEAD: Normocephalic/atraumatic EYES: EOMI/PERRL ENMT: Mucous membranes moist; Patient coughing up clear, mucoid phlegm  NECK: supple no meningeal signs SPINE:entire spine nontender CV: S1/S2 noted, no murmurs/rubs/gallops noted LUNGS: Lungs are clear to auscultation bilaterally, no apparent distress ABDOMEN: soft, nontender, no rebound or guarding GU:no cva tenderness NEURO: Pt is awake/alert, moves all extremitiesx4 EXTREMITIES: pulses normal, full ROM SKIN: warm, color normal PSYCH: no abnormalities of mood noted  Filed Vitals:   01/25/14 1156  BP: 122/84  Pulse: 69  Temp: 97.6 F (36.4 C)  Resp: 16        Assessment & Plan:  Patient has a persistent cough however it is better. She has been continuing to lose weight despite protein supplements. She has an appointment with Dr. Elder Negus in July. As recommended by Dr. Melvyn Novas  we'll proceed with CT of the chest and  sinus. I did refill her alprazolam to take one half to one at bedtime. 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

## 2014-01-25 NOTE — Telephone Encounter (Signed)
Pt is inquiring about her visit today with Dr.Daub, she does not know if he wants her to go back on her normal meds or not, please advise pt

## 2014-01-26 NOTE — Telephone Encounter (Signed)
Pt wants to know if she can resume taking pravastatin again. Says we took her off all of her meds. Wants to know if she can resume her Vitamins and Axid as well.

## 2014-01-26 NOTE — Telephone Encounter (Signed)
Yes she can resume all her regular meds

## 2014-01-26 NOTE — Telephone Encounter (Signed)
Unable to LM

## 2014-01-26 NOTE — Telephone Encounter (Signed)
Pt.notified

## 2014-01-28 ENCOUNTER — Other Ambulatory Visit: Payer: Self-pay | Admitting: Radiology

## 2014-01-28 DIAGNOSIS — J329 Chronic sinusitis, unspecified: Secondary | ICD-10-CM

## 2014-01-31 ENCOUNTER — Ambulatory Visit: Payer: Medicare Other | Admitting: Emergency Medicine

## 2014-01-31 ENCOUNTER — Other Ambulatory Visit: Payer: Medicare Other

## 2014-02-07 ENCOUNTER — Ambulatory Visit
Admission: RE | Admit: 2014-02-07 | Discharge: 2014-02-07 | Disposition: A | Discharge status: Home / Self Care | Payer: Medicare Other | Source: Ambulatory Visit | Attending: Emergency Medicine | Admitting: Emergency Medicine

## 2014-02-07 ENCOUNTER — Encounter: Payer: Self-pay | Admitting: Internal Medicine

## 2014-02-07 ENCOUNTER — Telehealth: Payer: Self-pay | Admitting: Emergency Medicine

## 2014-02-07 DIAGNOSIS — R9389 Abnormal findings on diagnostic imaging of other specified body structures: Secondary | ICD-10-CM

## 2014-02-07 DIAGNOSIS — J984 Other disorders of lung: Secondary | ICD-10-CM | POA: Diagnosis not present

## 2014-02-07 DIAGNOSIS — R05 Cough: Secondary | ICD-10-CM

## 2014-02-07 DIAGNOSIS — J342 Deviated nasal septum: Secondary | ICD-10-CM | POA: Diagnosis not present

## 2014-02-07 DIAGNOSIS — R059 Cough, unspecified: Secondary | ICD-10-CM

## 2014-02-07 NOTE — Telephone Encounter (Signed)
Outpatient CT scans of the chest and sinuses show only scarring in the lung bases there are no areas suspicious for cancer

## 2014-02-07 NOTE — Progress Notes (Signed)
Quick Note:  lmomtcb for pt ______ 

## 2014-02-07 NOTE — Telephone Encounter (Signed)
Left message on machine to call back  

## 2014-02-08 NOTE — Telephone Encounter (Signed)
Pt.notified

## 2014-02-11 NOTE — Progress Notes (Signed)
Quick Note:  Spoke with pt and notified of results per Dr. Wert. Pt verbalized understanding and denied any questions.  ______ 

## 2014-02-14 ENCOUNTER — Ambulatory Visit (INDEPENDENT_AMBULATORY_CARE_PROVIDER_SITE_OTHER): Payer: Medicare Other | Admitting: Internal Medicine

## 2014-02-14 ENCOUNTER — Encounter: Payer: Self-pay | Admitting: Internal Medicine

## 2014-02-14 VITALS — BP 120/86 | HR 63 | Temp 98.4°F | Ht 60.0 in | Wt 99.6 lb

## 2014-02-14 DIAGNOSIS — R059 Cough, unspecified: Secondary | ICD-10-CM | POA: Diagnosis not present

## 2014-02-14 DIAGNOSIS — R05 Cough: Secondary | ICD-10-CM | POA: Diagnosis not present

## 2014-02-14 DIAGNOSIS — R9389 Abnormal findings on diagnostic imaging of other specified body structures: Secondary | ICD-10-CM | POA: Diagnosis not present

## 2014-02-14 NOTE — Patient Instructions (Addendum)
Axid 150 mg after bfast and at bedtime      GERD (REFLUX)  is an extremely common cause of respiratory symptoms, many times with no significant heartburn at all.    It can be treated with medication, but also with lifestyle changes including avoidance of late meals, excessive alcohol, smoking cessation, and avoid fatty foods, chocolate, peppermint, colas, red wine, and acidic juices such as orange juice.  NO MINT OR MENTHOL PRODUCTS SO NO COUGH DROPS  USE SUGARLESS CANDY INSTEAD (jolley ranchers or Stover's)  NO OIL BASED VITAMINS - use powdered substitutes.  Pulmonary follow up is as needed

## 2014-02-14 NOTE — Progress Notes (Signed)
Subjective:    Patient ID: Caroline Snyder, female    DOB: [birth year]  MRN: [id number]    Brief patient profile:  89 yowf never smoker with new onset chronic  cough x around 2012 then  spring time nasal congestion/ runny nose x sev years after that assoc with some headaches then abruptly ill with severe cough x mid April 2015 leading to admit at [facility]    History of Present Illness   Date of Admission: 11/24/2013 Date of Discharge: 11/26/2013   Primary Care Provider: Estill Dooms, MD  Consultants: None  Indication for Hospitalization: Community acquired pneumonia  Discharge Diagnoses/Problem List:  1. Community acquired pneumonia 2. Hemoptysis 3. Elevated transaminases 4. Hyperlipidemia 5. GERD 6. Chronic cough Disposition: Discharge home  Discharge Condition: Stable  Discharge Exam:  General: Was walking around in her room, in no acute distress, pleasant, looks slightly fatigued  Cardiovascular: Regular rate and rhythm  Respiratory: Clear to auscultation bilaterally, no wheezing  Abdomen: soft, non-tender, non-distended  Extremities: No calf tenderness, no erythema or swelling  Skin: No visible rashes or cyanosis  Neuro: Alert, oriented x3  Brief Hospital Course:  HPI: Caroline Snyder is a 73 y.o. female presenting with productive cough and hemoptysis  Went to see. Dr. Everlene Farrier on Thursday due to chronic cough with recent development of hemoptysis (mostly red at one point then faded) starting Wednesday night but had some yellow or green phlegm. Took some aleve and Mucinex DM starting on Wednesday with little relief. Complained of chest pain with cough but no shortness of breath. Denies fevers. Went to ED and they did a CT angio chest which did not show a cause of hemoptysis (did show mildly enlarged lymph nodes in right hilar and subcarinal region). Due to low back pain (worse with cough), they offered lumbar x-ray which patient declined. Declined evaluation for GU or rectal  bleeding. No low plateelts or elevated PT/PTT/INR. Was referred to pulmonology.  Symptoms getting worse this morning/not improving so went back to urgent care. Mild nausea today and vomited x1 (nonbilious/nonbloody). Hemoglobin noticed to be down to 11.1 from 13.2 two days ago. Patient direct admitted from urgent care for evaluation.  Community acquired pneumonia: chest x-ray showed concern for developing right sided pneumonia. Patient started on Levaquin for treatment and Vicodin for cough/pain. Tessalon also ordered for cough. Patient had improvement with treatment. She had no fevers and decreased coughing. Patient transitioned to azithromycin before discharge home. Physical therapy met with patient and cleared patient for home without PT services.  Hemoptysis: patient did not have any hemoptysis while admitted. Most likely related to recent worsening cough. Will be followed outpatient.  Elevated transaminases: thought to be due to dehydration. Elevated on admission, however, decreased on repeat check.  Hyperlipidemia: continued home statin  GERD: continued home H2 blocker  Chronic cough: history not concerning for TB. Most likely related to GERD vs atypical pneumonia. Patient continued on GERD treatment and on antibiotics for pneumonia.  11/27/13 last zmax rx  12/04/2013 1st [facility] office visit/ Dejon Lukas  Daily cough x 2012  Chief Complaint  Patient presents with  . Advice Only    Referred by Dr. Everlene Farrier for PNA   70 -90% better with now min hemoptysis and Not limited by breathing from desired activities   Never had epistaxis rec Axid 150 mg after bfast and at bedtime until return  No aspirin until no bleeding whatsoever for 3 days  GERD diet     01/03/2014  f/u ov/Burrell Hodapp re: cough x 3 years daily at some point Chief Complaint  Patient presents with  . Followup with CXR    Pt states that her breathing has improved back to normal baseline. Cough has improved some, still has occ prod cough  with minimal clear sputum.       Kouffman Reflux v Neurogenic Cough Differentiator Reflux Comments  Do you awaken from a sound sleep coughing violently?                            With trouble breathing? no   Do you have choking episodes when you cannot  Get enough air, gasping for air ?              no   Do you usually cough when you lie down into  The bed, or when you just lie down to rest ?                          Frequently  Not in am though   Do you usually cough after meals or eating?         no   Do you cough when (or after) you bend over?    No    GERD SCORE     Kouffman Reflux v Neurogenic Cough Differentiator Neurogenic   Do you more-or-less cough all day long? yes   Does change of temperature make you cough? Cool maybe   Does laughing or chuckling cause you to cough? no   Do fumes (perfume, automobile fumes, burned  Toast, etc.,) cause you to cough ?      Maybe diesel   Does speaking, singing, or talking on the phone cause you to cough   ?               yes   Neurogenic/Airway score     rec Change tessilon to 200 mg every 4 hours during the day to completely suppress the urge to cough and clear your throat Prednisone 10 mg take  4 each am x 2 days,   2 each am x 2 days,  1 each am x 2 days and stop  Stay on axid twice daily after meals consistently  Please schedule a follow up office visit in 6 weeks, call sooner if needed  Late add :  If still coughing next step repeat chest Ct and also do sinus ct > no acute changes   02/14/2014 f/u ov/Carletha Dawn re: chronic cough/ non compliant with gerd rx, not using any tessilon Chief Complaint  Patient presents with  . Follow-up    Pt states that her cough is some better since the last visit. Breathing still doing well. No new co's today.  Not limited by breathing from desired activities     No obvious other patters in day to day or daytime variabilty or assoc cp or chest tightness, subjective wheeze overt sinus or hb symptoms. No  unusual exp hx or h/o childhood pna/ asthma or knowledge of premature birth.  Sleeping ok without nocturnal  or early am exacerbation  of respiratory  c/o's or need for noct saba. Also denies any obvious fluctuation of symptoms with weather or environmental changes or other aggravating or alleviating factors except as outlined above   Current Medications, Allergies, Complete Past Medical History, Past Surgical History, Family History, and Social History were reviewed in [employer] record.  ROS  The following are not active complaints unless bolded sore throat, dysphagia, dental problems, itching, sneezing,  nasal congestion or excess/ purulent secretions, ear ache,   fever, chills, sweats, unintended wt loss, pleuritic or exertional cp, hemoptysis,  orthopnea pnd or leg swelling, presyncope, palpitations, heartburn, abdominal pain, anorexia, nausea, vomiting, diarrhea  or change in bowel or urinary habits, change in stools or urine, dysuria,hematuria,  rash, arthralgias, visual complaints, headache, numbness weakness or ataxia or problems with walking or coordination,  change in mood/affect or memory.                          Objective:   Physical Exam  01/03/2014         99 > 02/14/2014 99   Wt Readings from Last 3 Encounters:  12/04/13 98 lb 3.2 oz (44.543 kg)  11/30/13 99 lb (44.906 kg)  11/25/13 104 lb 1.6 oz (47.219 kg)     HEENT: nl dentition, turbinates, and orophanx. Nl external ear canals without cough reflex   NECK :  without JVD/Nodes/TM/ nl carotid upstrokes bilaterally   LUNGS: no acc muscle use, clear to A and P bilaterally without cough on insp or exp maneuvers   CV:  RRR  no s3 or murmur or increase in P2, no edema   ABD:  soft and nontender with nl excursion in the supine position. No bruits or organomegaly, bowel sounds nl  MS:  warm without deformities, calf tenderness, cyanosis or clubbing  SKIN: warm and dry without lesions     NEURO:  alert, approp, no deficits    Ct chest 02/07/14    1. No mediastinal or hilar adenopathy is seen. The previously  described small lymph nodes appear stable although this study is  somewhat limited without IV contrast media.  2. Linear scarring is noted in the lingula and left lower lobe. No  interstitial lung disease is seen      Assessment & Plan:

## 2014-02-16 NOTE — Assessment & Plan Note (Signed)
-   onset March 2012 - Sinus CT 02/07/2014 > neg    This is a chronic problem that is so mild she has a hard time remembering to take meds previously rec  The standardized cough guidelines published in Chest by Lissa Morales in 2006 are still the best available and consist of a multiple step process (up to 12!) , not a single office visit,  and are intended  to address this problem logically,  with an alogrithm dependent on response to empiric treatment at  each progressive step  to determine a specific diagnosis with  minimal addtional testing needed. Therefore if adherence is an issue or can't be accurately verified,  it's very unlikely the standard evaluation and treatment will be successful here.    Furthermore, response to therapy (other than acute cough suppression, which should only be used short term with avoidance of narcotic containing cough syrups if possible), can be a gradual process for which the patient may perceive immediate benefit.  Unlike going to an eye doctor where the best perscription is almost always the first one and is immediately effective, this is almost never the case in the management of chronic cough syndromes. Therefore the patient needs to commit up front to consistently adhere to recommendations  for up to 6 weeks of therapy directed at the likely underlying problem(s) before the response can be reasonably evaluated.    Offered to see her back on a prn basis to work thru the 12 step process per cough guidelines if she desires but at this point pulmonary f/u can be prn

## 2014-02-16 NOTE — Assessment & Plan Note (Signed)
See CT chest 11/22/13 1. No CT evidence of pulmonary arterial embolic disease.  2. Enlarged lymph nodes in the right hilar and subcarinal region.  These may represent reactive lymph nodes, clinical correlation  recommended. There is otherwise no evidence of mediastinal or  parenchymal masses or nodules nor infiltrates.  3. Atelectasis versus scarring within the lung bases as well as  interstitial fibrotic changes. - f/u CT 02/07/2014  chronic changes only> no f/u needed

## 2014-02-25 ENCOUNTER — Other Ambulatory Visit: Payer: Self-pay | Admitting: Emergency Medicine

## 2014-03-01 ENCOUNTER — Other Ambulatory Visit: Payer: Medicare Other

## 2014-03-05 ENCOUNTER — Encounter: Payer: Medicare Other | Admitting: Internal Medicine

## 2014-03-11 ENCOUNTER — Other Ambulatory Visit: Payer: Medicare Other

## 2014-03-11 DIAGNOSIS — E785 Hyperlipidemia, unspecified: Secondary | ICD-10-CM

## 2014-03-11 DIAGNOSIS — I10 Essential (primary) hypertension: Secondary | ICD-10-CM

## 2014-03-12 LAB — LIPID PANEL
Chol/HDL Ratio: 2.8 ratio units (ref 0.0–4.4)
Cholesterol, Total: 254 mg/dL — ABNORMAL HIGH (ref 100–199)
HDL: 90 mg/dL (ref >39)
LDL CALC: 140 mg/dL — AB (ref 0–99)
Triglycerides: 119 mg/dL (ref 0–149)
VLDL Cholesterol Cal: 24 mg/dL (ref 5–40)

## 2014-03-12 LAB — COMPREHENSIVE METABOLIC PANEL
ALK PHOS: 54 IU/L (ref 39–117)
ALT: 16 IU/L (ref 0–32)
AST: 21 IU/L (ref 0–40)
Albumin/Globulin Ratio: 2.1 (ref 1.1–2.5)
Albumin: 4.5 g/dL (ref 3.5–4.8)
BILIRUBIN TOTAL: 0.2 mg/dL (ref 0.0–1.2)
BUN / CREAT RATIO: 17 (ref 11–26)
BUN: 14 mg/dL (ref 8–27)
CHLORIDE: 99 mmol/L (ref 97–108)
CO2: 25 mmol/L (ref 18–29)
Calcium: 9.7 mg/dL (ref 8.7–10.3)
Creatinine, Ser: 0.81 mg/dL (ref 0.57–1.00)
GFR calc non Af Amer: 72 mL/min/{1.73_m2} (ref >59)
GFR, EST AFRICAN AMERICAN: 83 mL/min/{1.73_m2} (ref >59)
Globulin, Total: 2.1 g/dL (ref 1.5–4.5)
Glucose: 99 mg/dL (ref 65–99)
Potassium: 4.4 mmol/L (ref 3.5–5.2)
Sodium: 142 mmol/L (ref 134–144)
Total Protein: 6.6 g/dL (ref 6.0–8.5)

## 2014-03-13 ENCOUNTER — Ambulatory Visit (INDEPENDENT_AMBULATORY_CARE_PROVIDER_SITE_OTHER): Payer: Medicare Other | Admitting: Internal Medicine

## 2014-03-13 ENCOUNTER — Encounter: Payer: Self-pay | Admitting: Internal Medicine

## 2014-03-13 VITALS — BP 126/72 | HR 64 | Temp 97.0°F | Ht 61.0 in | Wt 97.0 lb

## 2014-03-13 DIAGNOSIS — R945 Abnormal results of liver function studies: Secondary | ICD-10-CM

## 2014-03-13 DIAGNOSIS — R05 Cough: Secondary | ICD-10-CM | POA: Diagnosis not present

## 2014-03-13 DIAGNOSIS — E785 Hyperlipidemia, unspecified: Secondary | ICD-10-CM | POA: Diagnosis not present

## 2014-03-13 DIAGNOSIS — R9389 Abnormal findings on diagnostic imaging of other specified body structures: Secondary | ICD-10-CM

## 2014-03-13 DIAGNOSIS — F411 Generalized anxiety disorder: Secondary | ICD-10-CM

## 2014-03-13 DIAGNOSIS — R059 Cough, unspecified: Secondary | ICD-10-CM

## 2014-03-13 DIAGNOSIS — I10 Essential (primary) hypertension: Secondary | ICD-10-CM | POA: Diagnosis not present

## 2014-03-13 MED ORDER — ALPRAZOLAM 0.5 MG PO TABS: ORAL_TABLET | ORAL | Status: DC

## 2014-03-13 NOTE — Progress Notes (Signed)
Patient ID: Caroline Snyder, female   DOB: 07/28/1941, 73 y.o.   MRN: [id number]    Location:    [facility]  Place of Service:  OFFICE  Extended Emergency Contact Information Primary Emergency Contact: Lowell Bouton, [place] [employer] Phone: [phone] Relation: Son   Risk analyst Complaint  Patient presents with  . Annual Exam    Physical, discuss labs  . other    ? Prevnar     HPI:  Abnormal CT of the chest: saw Dr. Melvyn Novas who thinks the abnormality was related to recent acute infection and did not recommend additional CT   Cough: improved. Occasional dry cough remains. She saysshe has had this for many years.  Hyperlipidemia: high HDL at 90. LDL also runs high at 140, She has been off her pravastatin.  Unspecified essential hypertension: controlled  Nonspecific abnormal results of liver function study - back to normal  Anxiety state, unspecified:benefits from Xanax    Past Medical History  Diagnosis Date  . Allergy   . Depression 05/05/2007  . Hyperlipidemia 06/29/2006  . 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 Bunion 07/01/2010  . Unspecified essential hypertension 11/22/2007  . Osteoporosis, unspecified 04/15/2004  . Memory loss 04/15/2003  . Migraine without aura, without mention of intractable migraine without mention of status migrainosus 03/24/1999  . 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 External hemorrhoids without mention of complication 85/09/7739  . GERD (gastroesophageal reflux disease) 2000    Past Surgical History  Procedure Laterality Date  . Vaginal hysterectomy  1980  . Eye surgery Bilateral 2010    cataract Dr. Bing Plume  . Colonoscopy  06-16-2007    Internal hemorrhoids,laxity of anal sphincter,polyp at 25cm form anal verge, tortuous sigmoid colon. Dr.Orr     History   Social History  . Marital Status: Widowed    Spouse Name: N/A    Number of Children: 2  . Years of Education: N/A   Occupational  History  . Retired    Social History Main Topics  . Smoking status: Never Smoker   . Smokeless tobacco: Never Used  . Alcohol Use: No  . Drug Use: No  . Sexual Activity: Not Currently   Other Topics Concern  . Not on file   Social History Narrative  . No narrative on file     reports that she has never smoked. She has never used smokeless tobacco. She reports that she does not drink alcohol or use illicit drugs.  Immunization History  Administered Date(s) Administered  . Influenza,inj,Quad PF,36+ Mos 07/19/2013  . PPD Test 11/30/2013    Allergies  Allergen Reactions  . Vioxx [Rofecoxib] Nausea Only    Pt. Can't remember what reaction was but asks to leave on her profile.     Medications: Patient's Medications  New Prescriptions   No medications on file  Previous Medications   ACETAMINOPHEN (TYLENOL) 500 MG TABLET    Take 1 tablet (500 mg total) by mouth every 4 (four) hours as needed for moderate pain.   ACYCLOVIR (ZOVIRAX) 200 MG CAPSULE    Take 1 capsule by mouth as needed.   ALPRAZOLAM (XANAX) 0.5 MG TABLET    TAKE 1/2-1 TABLET BY MOUTH EVERY NIGHT AT BEDTIME   ASPIRIN 81 MG TABLET    Take 81 mg by mouth daily. Take 1 tablet daily to prevent heart attack, stroke and clotting.   CHOLECALCIFEROL (HM VITAMIN D3) 2000 UNITS CAPS  Take by mouth. Take 1 tablet daily   CRANBERRY 250 MG TABS    Take by mouth. Take 1 tablet daily   CYCLOSPORINE (RESTASIS) 0.05 % OPHTHALMIC EMULSION    1 drop 2 (two) times daily.   MULTIPLE VITAMIN (MULTI VITAMIN DAILY PO)    Take by mouth. Centrum Silver for Women Take 1 tablet daily   MULTIPLE VITAMINS-MINERALS (ICAPS) TABS    Take 1 tablet by mouth 2 (two) times daily.   NIZATIDINE (AXID) 150 MG CAPSULE    Take 1 capsule (150 mg total) by mouth 2 (two) times daily as needed. Take one tablet twice daily as needed to help stomach.   NONFORMULARY OR COMPOUNDED ITEM    Estradiol .02% 1 ML Prefilled Applicator Sig: apply vaginally twice a  week #90 Day Supply with 4 refills   PRAVASTATIN (PRAVACHOL) 40 MG TABLET    Take 40 mg by mouth daily.  Modified Medications   No medications on file  Discontinued Medications   BENZONATATE (TESSALON) 200 MG CAPSULE    Take 1 capsule (200 mg total) by mouth 3 (three) times daily as needed for cough.   ONDANSETRON (ZOFRAN) 4 MG TABLET    Take 1 tablet (4 mg total) by mouth every 6 (six) hours as needed for nausea.     Review of Systems  Constitutional: Positive for fatigue. Negative for unexpected weight change.  HENT: Negative.   Eyes: Negative.   Respiratory: Negative.   Cardiovascular: Positive for palpitations.  Gastrointestinal: Negative.   Genitourinary:       History of stress incontinence. Some increase in frequency. Denies nocturia or dysuria.  Musculoskeletal: Negative.   Skin: Negative.   Neurological: Negative.   Hematological: Negative.   Psychiatric/Behavioral:       Some sadness.    Filed Vitals:   03/13/14 1506  BP: 126/72  Pulse: 64  Temp: 97 F (36.1 C)  TempSrc: Oral  Height: 5\' 1"  (1.549 m)  Weight: 97 lb (43.999 kg)  SpO2: 98%   Body mass index is 18.34 kg/(m^2).  Physical Exam  Constitutional: She is oriented to person, place, and time. She appears well-developed and well-nourished. No distress.  HENT:  Right Ear: External ear normal.  Left Ear: External ear normal.  Nose: Nose normal.  Mouth/Throat: Oropharynx is clear and moist.  Eyes: Conjunctivae are normal. Pupils are equal, round, and reactive to light. Right eye exhibits no discharge. Left eye exhibits no discharge. No scleral icterus.  Corrective lenses  Neck: Normal range of motion. Neck supple. No JVD present. No tracheal deviation present. No thyromegaly present.  Cardiovascular: Normal rate, regular rhythm, normal heart sounds and intact distal pulses.  Exam reveals no gallop and no friction rub.   No murmur heard. Pulmonary/Chest: Effort normal and breath sounds normal. No  respiratory distress. She has no wheezes. She has no rales. She exhibits no tenderness.  Abdominal: Soft. Bowel sounds are normal. She exhibits no distension and no mass. There is no tenderness. There is no rebound.  Musculoskeletal: Normal range of motion. She exhibits no edema and no tenderness.  Bilateral bunions  Lymphadenopathy:    She has no cervical adenopathy.  Neurological: She is alert and oriented to person, place, and time. No cranial nerve deficit. Coordination normal.  Skin: No rash noted. No erythema. No pallor.  Psychiatric: She has a normal mood and affect. Judgment and thought content normal.     Labs reviewed: Appointment on 03/11/2014  Component Date Value Ref Range Status  .  Glucose 03/11/2014 99  65 - 99 mg/dL Final  . BUN 03/11/2014 14  8 - 27 mg/dL Final  . Creatinine, Ser 03/11/2014 0.81  0.57 - 1.00 mg/dL Final  . GFR calc non Af Amer 03/11/2014 72  >59 mL/min/1.73 Final  . GFR calc Af Amer 03/11/2014 83  >59 mL/min/1.73 Final  . BUN/Creatinine Ratio 03/11/2014 17  11 - 26 Final  . Sodium 03/11/2014 142  134 - 144 mmol/L Final  . Potassium 03/11/2014 4.4  3.5 - 5.2 mmol/L Final  . Chloride 03/11/2014 99  97 - 108 mmol/L Final  . CO2 03/11/2014 25  18 - 29 mmol/L Final  . Calcium 03/11/2014 9.7  8.7 - 10.3 mg/dL Final  . Total Protein 03/11/2014 6.6  6.0 - 8.5 g/dL Final  . Albumin 03/11/2014 4.5  3.5 - 4.8 g/dL Final  . Globulin, Total 03/11/2014 2.1  1.5 - 4.5 g/dL Final  . Albumin/Globulin Ratio 03/11/2014 2.1  1.1 - 2.5 Final  . Total Bilirubin 03/11/2014 0.2  0.0 - 1.2 mg/dL Final  . Alkaline Phosphatase 03/11/2014 54  39 - 117 IU/L Final  . AST 03/11/2014 21  0 - 40 IU/L Final  . ALT 03/11/2014 16  0 - 32 IU/L Final  . Cholesterol, Total 03/11/2014 254* 100 - 199 mg/dL Final  . Triglycerides 03/11/2014 119  0 - 149 mg/dL Final  . HDL 03/11/2014 90  >39 mg/dL Final   Comment: According to ATP-III Guidelines, HDL-C >59 mg/dL is considered a                           negative risk factor for CHD.  Marland Kitchen VLDL Cholesterol Cal 03/11/2014 24  5 - 40 mg/dL Final  . LDL Calculated 03/11/2014 140* 0 - 99 mg/dL Final  . Chol/HDL Ratio 03/11/2014 2.8  0.0 - 4.4 ratio units Final   Comment:                                   T. Chol/HDL Ratio                                                                      Men  Women                                                        1/2 Avg.Risk  3.4    3.3                                                            Avg.Risk  5.0    4.4  2X Avg.Risk  9.6    7.1                                                         3X Avg.Risk 23.4   11.0  Appointment on 12/19/2013  Component Date Value Ref Range Status  . Total Bilirubin 12/19/2013 0.5  0.2 - 1.2 mg/dL Final  . Bilirubin, Direct 12/19/2013 0.0  0.0 - 0.3 mg/dL Final  . Alkaline Phosphatase 12/19/2013 52  39 - 117 U/L Final  . AST 12/19/2013 26  0 - 37 U/L Final  . ALT 12/19/2013 21  0 - 35 U/L Final  . Total Protein 12/19/2013 6.7  6.0 - 8.3 g/dL Final  . Albumin 12/19/2013 4.0  3.5 - 5.2 g/dL Final      Assessment/Plan  1. Abnormal CT of the chest No further tests needed  2. Cough improved  3. Hyperlipidemia continue pravastatin  4. Unspecified essential hypertension controlled  5. Nonspecific abnormal results of liver function study - Comprehensive metabolic panel; Future  6. Anxiety state, unspecified -- ALPRAZolam (XANAX) 0.5 MG tablet; TAKE 1/2-1 TABLET BY MOUTH EVERY NIGHT AT BEDTIME  Dispense: 30 tablet; Refill: 0

## 2014-03-18 ENCOUNTER — Other Ambulatory Visit: Payer: Self-pay | Admitting: Nurse Practitioner

## 2014-03-23 DIAGNOSIS — Z23 Encounter for immunization: Secondary | ICD-10-CM | POA: Diagnosis not present

## 2014-04-30 ENCOUNTER — Other Ambulatory Visit: Payer: Self-pay | Admitting: *Deleted

## 2014-04-30 DIAGNOSIS — F411 Generalized anxiety disorder: Secondary | ICD-10-CM

## 2014-04-30 MED ORDER — ACYCLOVIR 200 MG PO CAPS: 200.0000 mg | ORAL_CAPSULE | ORAL | Status: DC | PRN

## 2014-04-30 MED ORDER — ALPRAZOLAM 0.5 MG PO TABS: ORAL_TABLET | ORAL | Status: DC

## 2014-04-30 NOTE — Telephone Encounter (Signed)
[employer] 

## 2014-05-30 ENCOUNTER — Other Ambulatory Visit: Payer: Self-pay | Admitting: Internal Medicine

## 2014-06-03 ENCOUNTER — Encounter: Payer: Self-pay | Admitting: Internal Medicine

## 2014-09-13 ENCOUNTER — Other Ambulatory Visit: Payer: Medicare Other

## 2014-09-13 DIAGNOSIS — R945 Abnormal results of liver function studies: Secondary | ICD-10-CM | POA: Diagnosis not present

## 2014-09-14 LAB — COMPREHENSIVE METABOLIC PANEL
ALT: 18 IU/L (ref 0–32)
AST: 20 IU/L (ref 0–40)
Albumin/Globulin Ratio: 2 (ref 1.1–2.5)
Albumin: 4.3 g/dL (ref 3.5–4.8)
Alkaline Phosphatase: 52 IU/L (ref 39–117)
BUN/Creatinine Ratio: 19 (ref 11–26)
BUN: 17 mg/dL (ref 8–27)
CALCIUM: 9.9 mg/dL (ref 8.7–10.3)
CHLORIDE: 104 mmol/L (ref 97–108)
CO2: 22 mmol/L (ref 18–29)
CREATININE: 0.88 mg/dL (ref 0.57–1.00)
GFR calc Af Amer: 75 mL/min/{1.73_m2} (ref >59)
GFR calc non Af Amer: 65 mL/min/{1.73_m2} (ref >59)
GLOBULIN, TOTAL: 2.2 g/dL (ref 1.5–4.5)
GLUCOSE: 93 mg/dL (ref 65–99)
Potassium: 4.8 mmol/L (ref 3.5–5.2)
SODIUM: 140 mmol/L (ref 134–144)
TOTAL PROTEIN: 6.5 g/dL (ref 6.0–8.5)

## 2014-09-18 ENCOUNTER — Ambulatory Visit (INDEPENDENT_AMBULATORY_CARE_PROVIDER_SITE_OTHER): Payer: Medicare Other | Admitting: Internal Medicine

## 2014-09-18 ENCOUNTER — Encounter: Payer: Self-pay | Admitting: Internal Medicine

## 2014-09-18 VITALS — BP 162/92 | HR 64 | Temp 97.9°F | Ht 61.0 in | Wt 99.6 lb

## 2014-09-18 DIAGNOSIS — R945 Abnormal results of liver function studies: Secondary | ICD-10-CM | POA: Diagnosis not present

## 2014-09-18 DIAGNOSIS — E785 Hyperlipidemia, unspecified: Secondary | ICD-10-CM | POA: Diagnosis not present

## 2014-09-18 DIAGNOSIS — N941 Unspecified dyspareunia: Secondary | ICD-10-CM | POA: Insufficient documentation

## 2014-09-18 DIAGNOSIS — IMO0002 Reserved for concepts with insufficient information to code with codable children: Secondary | ICD-10-CM

## 2014-09-18 DIAGNOSIS — M810 Age-related osteoporosis without current pathological fracture: Secondary | ICD-10-CM | POA: Diagnosis not present

## 2014-09-18 DIAGNOSIS — I10 Essential (primary) hypertension: Secondary | ICD-10-CM

## 2014-09-18 DIAGNOSIS — F32A Depression, unspecified: Secondary | ICD-10-CM

## 2014-09-18 DIAGNOSIS — K2100 Gastro-esophageal reflux disease with esophagitis, without bleeding: Secondary | ICD-10-CM

## 2014-09-18 DIAGNOSIS — R634 Abnormal weight loss: Secondary | ICD-10-CM | POA: Diagnosis not present

## 2014-09-18 DIAGNOSIS — F329 Major depressive disorder, single episode, unspecified: Secondary | ICD-10-CM

## 2014-09-18 MED ORDER — ACYCLOVIR 200 MG PO CAPS: ORAL_CAPSULE | ORAL | Status: DC

## 2014-09-18 NOTE — Patient Instructions (Addendum)
You should see a gynecologist about the symptoms we discussed today.  Keep a record of your BP at home and bring to the next office visit.

## 2014-09-18 NOTE — Progress Notes (Signed)
Patient ID: Caroline Snyder, female   DOB: 03-12-1941, 74 y.o.   MRN: [id number]    Facility  [facility]    Place of Service:   OFFICE   Allergies  Allergen Reactions  . Vioxx [Rofecoxib] Nausea Only    Pt. Can't remember what reaction was but asks to leave on her profile.     Chief Complaint  Patient presents with  . Medical Management of Chronic Issues    6 Month Follow up    HPI:   Essential hypertension: elevation in BP today. She is very distressed about recently being dropped by her boyfriend.  Loss of weight: improved since last visit  Gastroesophageal reflux disease with esophagitis: improved  Depression: Having a relationship deteriorate, so she is presently sad and keyed up. "My heart is broken" .  Hyperlipidemia: last checked on 03/11/14. Runs high HDL at 90 and high LDL at 140. Needs follow up next visit.   Nonspecific abnormal results of liver function study: returned to normal  Osteoporosis: last Bone density 01/17/14 at office of Dr. Toney Rakes  Dyspareunia: Patient had pain with intercourse with her last partner. She doesn't know if she had herpes simplex.    Medications: Patient's Medications  New Prescriptions   No medications on file  Previous Medications   ACETAMINOPHEN (TYLENOL) 500 MG TABLET    Take 1 tablet (500 mg total) by mouth every 4 (four) hours as needed for moderate pain.   ACYCLOVIR (ZOVIRAX) 200 MG CAPSULE    TAKE ONE CAPSULE BY MOUTH EVERY DAY   ALPRAZOLAM (XANAX) 0.5 MG TABLET    Take 1/2 to One tablet by mouth every night at bedtime for rest   ASPIRIN 81 MG TABLET    Take 81 mg by mouth daily. Take 1 tablet daily to prevent heart attack, stroke and clotting.   CHOLECALCIFEROL (HM VITAMIN D3) 2000 UNITS CAPS    Take by mouth. Take 1 tablet daily   CRANBERRY 250 MG TABS    Take by mouth. Take 1 tablet daily   CYCLOSPORINE (RESTASIS) 0.05 % OPHTHALMIC EMULSION    1 drop 2 (two) times daily.   MULTIPLE VITAMIN (MULTI VITAMIN DAILY PO)    Take  by mouth. Centrum Silver for Women Take 1 tablet daily   MULTIPLE VITAMINS-MINERALS (ICAPS) TABS    Take 1 tablet by mouth 2 (two) times daily.   NIZATIDINE (AXID) 150 MG CAPSULE    Take 1 capsule (150 mg total) by mouth 2 (two) times daily as needed. Take one tablet twice daily as needed to help stomach.   NONFORMULARY OR COMPOUNDED ITEM    Estradiol .02% 1 ML Prefilled Applicator Sig: apply vaginally twice a week #90 Day Supply with 4 refills   PRAVASTATIN (PRAVACHOL) 40 MG TABLET    Take 40 mg by mouth daily.   PRAVASTATIN (PRAVACHOL) 40 MG TABLET    TAKE 1 TABLET BY MOUTH EVERY DAY  Modified Medications   No medications on file  Discontinued Medications   No medications on file     Review of Systems  Constitutional: Positive for fatigue. Negative for unexpected weight change.  HENT: Negative.   Eyes: Negative.   Respiratory: Negative.   Cardiovascular: Positive for palpitations.  Gastrointestinal: Negative.   Genitourinary:       History of stress incontinence. Some increase in frequency. Denies nocturia or dysuria. Complains of pain on intercourse.  Musculoskeletal: Negative.   Skin: Negative.   Neurological: Negative.   Hematological: Negative.   Psychiatric/Behavioral:  Some sadness.    Filed Vitals:   09/18/14 1623  BP: 162/92  Pulse: 64  Temp: 97.9 F (36.6 C)  TempSrc: Oral  Height: 5\' 1"  (1.549 m)  Weight: 99 lb 9.6 oz (45.178 kg)   Body mass index is 18.83 kg/(m^2).  Physical Exam  Constitutional: She is oriented to person, place, and time. She appears well-developed and well-nourished. No distress.  HENT:  Right Ear: External ear normal.  Left Ear: External ear normal.  Nose: Nose normal.  Mouth/Throat: Oropharynx is clear and moist.  Eyes: Conjunctivae are normal. Pupils are equal, round, and reactive to light. Right eye exhibits no discharge. Left eye exhibits no discharge. No scleral icterus.  Corrective lenses  Neck: Normal range of motion.  Neck supple. No JVD present. No tracheal deviation present. No thyromegaly present.  Cardiovascular: Normal rate, regular rhythm, normal heart sounds and intact distal pulses.  Exam reveals no gallop and no friction rub.   No murmur heard. Pulmonary/Chest: Effort normal and breath sounds normal. No respiratory distress. She has no wheezes. She has no rales. She exhibits no tenderness.  Abdominal: Soft. Bowel sounds are normal. She exhibits no distension and no mass. There is no tenderness. There is no rebound.  Musculoskeletal: Normal range of motion. She exhibits no edema or tenderness.  Bilateral bunions  Lymphadenopathy:    She has no cervical adenopathy.  Neurological: She is alert and oriented to person, place, and time. No cranial nerve deficit. Coordination normal.  Skin: No rash noted. No erythema. No pallor.  Psychiatric: She has a normal mood and affect. Judgment and thought content normal.     Labs reviewed: Appointment on 09/13/2014  Component Date Value Ref Range Status  . Glucose 09/13/2014 93  65 - 99 mg/dL Final  . BUN 09/13/2014 17  8 - 27 mg/dL Final  . Creatinine, Ser 09/13/2014 0.88  0.57 - 1.00 mg/dL Final  . GFR calc non Af Amer 09/13/2014 65  >59 mL/min/1.73 Final  . GFR calc Af Amer 09/13/2014 75  >59 mL/min/1.73 Final  . BUN/Creatinine Ratio 09/13/2014 19  11 - 26 Final  . Sodium 09/13/2014 140  134 - 144 mmol/L Final  . Potassium 09/13/2014 4.8  3.5 - 5.2 mmol/L Final  . Chloride 09/13/2014 104  97 - 108 mmol/L Final  . CO2 09/13/2014 22  18 - 29 mmol/L Final  . Calcium 09/13/2014 9.9  8.7 - 10.3 mg/dL Final  . Total Protein 09/13/2014 6.5  6.0 - 8.5 g/dL Final  . Albumin 09/13/2014 4.3  3.5 - 4.8 g/dL Final  . Globulin, Total 09/13/2014 2.2  1.5 - 4.5 g/dL Final  . Albumin/Globulin Ratio 09/13/2014 2.0  1.1 - 2.5 Final  . Bilirubin Total 09/13/2014 <0.2  0.0 - 1.2 mg/dL Final  . Alkaline Phosphatase 09/13/2014 52  39 - 117 IU/L Final  . AST 09/13/2014 20   0 - 40 IU/L Final  . ALT 09/13/2014 18  0 - 32 IU/L Final     Assessment/Plan 1. Essential hypertension Advised patient to keep blood pressures at home and to notify us of she has sustained elevations in systolic greater than 606 or diastolic greater than 90. Because of her distress today, I think that her blood pressure may be elevated related to that. She will follow-up in about 6 weeks for recheck of her blood pressure. She will follow-up in about 6 weeks for a recheck of her blood pressure.  2. Loss of weight Improved  3. Gastroesophageal  reflux disease with esophagitis Improved  4. Depression Hopefully her current deterioration and emotional status is temporary. Patient was advised to call us should she need to speak about starting an antidepressant.  5. Hyperlipidemia -Lipid panel, future  6. Nonspecific abnormal results of liver function study Resolved  7. Osteoporosis Dr. Toney Rakes advised her to continue vitamin D 2000 units daily and calcium supplement  8. Dyspareunia I strongly advised patient to contact Dr. Toney Rakes or another gynecologist for a complete pelvic exam. She may need colposcopy. - acyclovir (ZOVIRAX) 200 MG capsule; One daily as needed to prevent vaginal discomfort related to viral infection.  Dispense: 30 capsule; Refill: 3

## 2014-09-24 ENCOUNTER — Other Ambulatory Visit: Payer: Self-pay | Admitting: Internal Medicine

## 2014-10-04 ENCOUNTER — Ambulatory Visit: Payer: Medicare Other | Admitting: Gynecology

## 2014-10-10 ENCOUNTER — Ambulatory Visit: Payer: Medicare Other | Admitting: Gynecology

## 2014-10-17 ENCOUNTER — Encounter: Payer: Self-pay | Admitting: Gynecology

## 2014-10-17 ENCOUNTER — Ambulatory Visit (INDEPENDENT_AMBULATORY_CARE_PROVIDER_SITE_OTHER): Payer: Medicare Other | Admitting: Gynecology

## 2014-10-17 VITALS — BP 134/82

## 2014-10-17 DIAGNOSIS — Z202 Contact with and (suspected) exposure to infections with a predominantly sexual mode of transmission: Secondary | ICD-10-CM | POA: Diagnosis not present

## 2014-10-17 DIAGNOSIS — N9089 Other specified noninflammatory disorders of vulva and perineum: Secondary | ICD-10-CM

## 2014-10-17 DIAGNOSIS — Z113 Encounter for screening for infections with a predominantly sexual mode of transmission: Secondary | ICD-10-CM

## 2014-10-17 DIAGNOSIS — N952 Postmenopausal atrophic vaginitis: Secondary | ICD-10-CM | POA: Diagnosis not present

## 2014-10-17 DIAGNOSIS — N94819 Vulvodynia, unspecified: Secondary | ICD-10-CM

## 2014-10-17 LAB — HEPATITIS B SURFACE ANTIGEN: HEP B S AG: NEGATIVE

## 2014-10-17 LAB — RPR

## 2014-10-17 LAB — HEPATITIS C ANTIBODY: HCV Ab: NEGATIVE

## 2014-10-17 MED ORDER — ESTRADIOL 0.1 MG/GM VA CREA: 2.0000 g | TOPICAL_CREAM | VAGINAL | Status: DC

## 2014-10-17 NOTE — Patient Instructions (Signed)
Estradiol vaginal cream What is this medicine? ESTRADIOL (es tra DYE ole) contains the female hormone estrogen. It is used for symptoms of menopause, like vaginal dryness and irritation. This medicine may be used for other purposes; ask your health care provider or pharmacist if you have questions. COMMON BRAND NAME(S): Estrace What should I tell my health care provider before I take this medicine? They need to know if you have any of these conditions: -abnormal vaginal bleeding -blood vessel disease or blood clots -breast, cervical, endometrial, ovarian, liver, or uterine cancer -dementia -diabetes -gallbladder disease -heart disease or recent heart attack -high blood pressure -high cholesterol -high levels of calcium in the blood -hysterectomy -kidney disease -liver disease -migraine headaches -protein C deficiency -protein S deficiency -stroke -systemic lupus erythematosus (SLE) -tobacco smoker -an unusual or allergic reaction to estrogens, other hormones, soy, other medicines, foods, dyes, or preservatives -pregnant or trying to get pregnant -breast-feeding How should I use this medicine? This medicine is for use in the vagina only. Do not take by mouth. Follow the directions on the prescription label. Read package directions carefully before using. Use the special applicator supplied with the cream. Wash hands before and after use. Fill the applicator with the prescribed amount of cream. Lie on your back, part and bend your knees. Insert the applicator into the vagina and push the plunger to expel the cream into the vagina. Wash the applicator with warm soapy water and rinse well. Use exactly as directed for the complete length of time prescribed. Do not stop using except on the advice of your doctor or health care professional. A patient package insert for the product will be given with each prescription and refill. Read this sheet carefully each time. The sheet may change  frequently. Talk to your pediatrician regarding the use of this medicine in children. This medicine is not approved for use in children. Overdosage: If you think you have taken too much of this medicine contact a poison control center or emergency room at once. NOTE: This medicine is only for you. Do not share this medicine with others. What if I miss a dose? If you miss a dose, use it as soon as you can. If it is almost time for your next dose, use only that dose. Do not use double or extra doses. What may interact with this medicine? Do not take this medicine with any of the following medications: -aromatase inhibitors like aminoglutethimide, anastrozole, exemestane, letrozole, testolactone This medicine may also interact with the following medications: -barbiturates used for inducing sleep or treating seizures -carbamazepine -grapefruit juice -medicines for fungal infections like ketoconazole and itraconazole -raloxifene -rifabutin -rifampin -rifapentine -ritonavir -some antibiotics used to treat infections -St. John's Wort -tamoxifen -warfarin This list may not describe all possible interactions. Give your health care provider a list of all the medicines, herbs, non-prescription drugs, or dietary supplements you use. Also tell them if you smoke, drink alcohol, or use illegal drugs. Some items may interact with your medicine. What should I watch for while using this medicine? Visit your health care professional for regular checks on your progress. You will need a regular breast and pelvic exam. You should also discuss the need for regular mammograms with your health care professional, and follow his or her guidelines. This medicine can make your body retain fluid, making your fingers, hands, or ankles swell. Your blood pressure can go up. Contact your doctor or health care professional if you feel you are retaining fluid. If you have  any reason to think you are pregnant, stop taking  this medicine at once and contact your doctor or health care professional. Tobacco smoking increases the risk of getting a blood clot or having a stroke, especially if you are more than 75 years old. You are strongly advised not to smoke. If you wear contact lenses and notice visual changes, or if the lenses begin to feel uncomfortable, consult your eye care specialist. If you are going to have elective surgery, you may need to stop taking this medicine beforehand. Consult your health care professional for advice prior to scheduling the surgery. What side effects may I notice from receiving this medicine? Side effects that you should report to your doctor or health care professional as soon as possible: -allergic reactions like skin rash, itching or hives, swelling of the face, lips, or tongue -breast tissue changes or discharge -changes in vision -chest pain -confusion, trouble speaking or understanding -dark urine -general ill feeling or flu-like symptoms -light-colored stools -nausea, vomiting -pain, swelling, warmth in the leg -right upper belly pain -severe headaches -shortness of breath -sudden numbness or weakness of the face, arm or leg -trouble walking, dizziness, loss of balance or coordination -unusual vaginal bleeding -yellowing of the eyes or skin Side effects that usually do not require medical attention (report to your doctor or health care professional if they continue or are bothersome): -hair loss -increased hunger or thirst -increased urination -symptoms of vaginal infection like itching, irritation or unusual discharge -unusually weak or tired This list may not describe all possible side effects. Call your doctor for medical advice about side effects. You may report side effects to FDA at 1-800-FDA-1088. Where should I keep my medicine? Keep out of the reach of children. Store at room temperature between 15 and 30 degrees C (59 and 86 degrees F). Protect from  temperatures above 40 degrees C (104 degrees C). Do not freeze. Throw away any unused medicine after the expiration date. NOTE: This sheet is a summary. It may not cover all possible information. If you have questions about this medicine, talk to your doctor, pharmacist, or health care provider.  2015, [employer]. (2010-10-21 09:18:12)

## 2014-10-17 NOTE — Progress Notes (Signed)
   Patient is a 74 year old presented to the office today concerned of some irritation and tingling in her external genitalia. Her husband passed away several years ago and she recently started becoming intimate with a new partner and wanted to have an STD screen as well. She had asked her primary care physician F her husband had ever had HSV but the records were not available if she would like to have that checked as well. She does have history of vaginal atrophy and last year had been on vaginal estrogen but discontinued it.  Exam: Blood pressure 134/82 Pelvic: Bartholin urethra Skene glands with atrophic changes Vagina: Atrophic changes vaginal cuff intact Bimanual exam: Not done Rectal: No external lesions noted no digital exam done  Assessment/plan: Patient with severe vaginal atrophy requesting STD screening due to her new sexual partner. A GC and Chlamydia culture was obtained. As part of the screening we'll obtain an HIV, RPR, hepatitis B and C. A herpes panel will be obtained as well. For her vaginal atrophy she will be prescribed Estrace vaginal cream to apply twice a week not only internally but externally around her labia. Patient scheduled to return back to the office the summer for her annual exam or when necessary. Risk benefits and pros and cons of estrogen cream were discussed. Information was provided in written format.

## 2014-10-18 ENCOUNTER — Telehealth: Payer: Self-pay

## 2014-10-18 LAB — HIV ANTIBODY (ROUTINE TESTING W REFLEX): HIV 1&2 Ab, 4th Generation: NONREACTIVE

## 2014-10-18 LAB — GC/CHLAMYDIA PROBE AMP
CT Probe RNA: NEGATIVE
GC Probe RNA: NEGATIVE

## 2014-10-18 LAB — HSV(HERPES SMPLX)ABS-I+II(IGG+IGM)-BLD
HSV 1 GLYCOPROTEIN G AB, IGG: 4.87 IV — AB
HSV 2 Glycoprotein G Ab, IgG: 7.2 IV — ABNORMAL HIGH
Herpes Simplex Vrs I&II-IgM Ab (EIA): 0.89 INDEX

## 2014-10-18 NOTE — Telephone Encounter (Signed)
-----   Message from Terrance Mass, MD sent at 10/18/2014  3:18 PM EDT ----- Please inform patient that all her STD screen came back negative. 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 This would mean that she has developed antibodies just the same of 70% of the population. No active disease at the present time

## 2014-10-18 NOTE — Telephone Encounter (Signed)
She is not contagious. She has developed antibodies (to help protect her from future infections) from having been exposed in the past. Over 70-80 % of the world population has antibodies from past exposure. If she were to develop any lesions or outbreak on her genital or oral region have her come by off ie to be checked. She can safely enjoy her sex life but not if she were to develop an outbreak which has never had in her life to begin with. Send her information booklet on HSV or go to [employer] site

## 2014-10-18 NOTE — Telephone Encounter (Signed)
Encounter already open

## 2014-10-18 NOTE — Telephone Encounter (Signed)
I read patient what you wrote below. She asked does that mean she has herpes virus in her body and could flare up at some point and be contagious?

## 2014-10-21 NOTE — Telephone Encounter (Signed)
Patient informed of all this.

## 2014-10-24 DIAGNOSIS — H04123 Dry eye syndrome of bilateral lacrimal glands: Secondary | ICD-10-CM | POA: Diagnosis not present

## 2014-11-06 ENCOUNTER — Ambulatory Visit (INDEPENDENT_AMBULATORY_CARE_PROVIDER_SITE_OTHER): Payer: Medicare Other | Admitting: Internal Medicine

## 2014-11-06 ENCOUNTER — Encounter: Payer: Self-pay | Admitting: Internal Medicine

## 2014-11-06 VITALS — BP 124/68 | HR 71 | Temp 98.4°F | Ht 61.0 in | Wt 100.6 lb

## 2014-11-06 DIAGNOSIS — I10 Essential (primary) hypertension: Secondary | ICD-10-CM | POA: Diagnosis not present

## 2014-11-06 DIAGNOSIS — Z8619 Personal history of other infectious and parasitic diseases: Secondary | ICD-10-CM | POA: Diagnosis not present

## 2014-11-06 DIAGNOSIS — R634 Abnormal weight loss: Secondary | ICD-10-CM | POA: Diagnosis not present

## 2014-11-06 NOTE — Progress Notes (Signed)
Patient ID: Caroline Snyder, female   DOB: [birth year], 74 y.o.   MRN: [id number]    Facility  [facility]    Place of Service:   OFFICE   Allergies  Allergen Reactions  . Vioxx [Rofecoxib] Nausea Only    Pt. Can't remember what reaction was but asks to leave on her profile.     Chief Complaint  Patient presents with  . 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 Toney Rakes was positive for HSV 1 and 2. Patient occasionally gets some vaginal discomfort, but has never blistered. She seems to feel better after using acyclovir. I advised her that I thought it would be okay for him to engage in sex unless she was uncomfortable in the vaginal area  Essential hypertension: Blood pressure is very elevated last visit. It is back to normal today.  Loss of weight: Stable. No further weight loss    Medications: Patient's Medications  New Prescriptions   No medications on file  Previous Medications   ACETAMINOPHEN (TYLENOL) 500 MG TABLET    Take 1 tablet (500 mg total) by mouth every 4 (four) hours as needed for moderate pain.   ACYCLOVIR (ZOVIRAX) 200 MG CAPSULE    One daily as needed to prevent vaginal discomfort related to viral infection.   ALPRAZOLAM (XANAX) 0.5 MG TABLET    Take 1/2 to One tablet by mouth every night at bedtime for rest   ASPIRIN 81 MG TABLET    Take 81 mg by mouth daily. Take 1 tablet daily to prevent heart attack, stroke and clotting.   CHOLECALCIFEROL (HM VITAMIN D3) 2000 UNITS CAPS    Take by mouth. Take 1 tablet daily   CRANBERRY 250 MG TABS    Take by mouth. Take 1 tablet daily   CYCLOSPORINE (RESTASIS) 0.05 % OPHTHALMIC EMULSION    1 drop 2 (two) times daily.   ESTRADIOL (ESTRACE) 0.1 MG/GM VAGINAL CREAM    Place 1.60 Applicatorfuls vaginally 2 (two) times a week.   MULTIPLE VITAMIN (MULTI VITAMIN DAILY PO)    Take by mouth. Centrum Silver for Women Take 1 tablet daily   MULTIPLE  VITAMINS-MINERALS (ICAPS) TABS    Take 1 tablet by mouth 2 (two) times daily.   NIZATIDINE (AXID) 150 MG CAPSULE    Take 1 capsule (150 mg total) by mouth 2 (two) times daily as needed. Take one tablet twice daily as needed to help stomach.   PRAVASTATIN (PRAVACHOL) 40 MG TABLET    Take 40 mg by mouth daily.   PRAVASTATIN (PRAVACHOL) 40 MG TABLET    TAKE 1 TABLET BY MOUTH EVERY DAY  Modified Medications   No medications on file  Discontinued Medications   No medications on file     Review of Systems  Constitutional: Positive for fatigue. Negative for unexpected weight change.  HENT: Negative.   Eyes: Negative.   Respiratory: Negative.   Cardiovascular: Positive for palpitations.  Gastrointestinal: Negative.   Genitourinary:       History of stress incontinence. Some increase in frequency. Denies nocturia or dysuria. Complains of pain on intercourse.  Musculoskeletal: Negative.   Skin: Negative.   Neurological: Negative.   Hematological: Negative.   Psychiatric/Behavioral:       Some sadness.    Filed Vitals:   11/06/14 1614  BP: 124/68  Pulse: 71  Temp: 98.4 F (36.9 C)  TempSrc: Oral  Height: 5\' 1"  (1.549 m)  Weight: 100 lb 9.6 oz (45.632 kg)   Body mass index is 19.02 kg/(m^2).  Physical Exam  Constitutional: She is oriented to person, place, and time. She appears well-developed and well-nourished. No distress.  HENT:  Right Ear: External ear normal.  Left Ear: External ear normal.  Nose: Nose normal.  Mouth/Throat: Oropharynx is clear and moist.  Eyes: Conjunctivae are normal. Pupils are equal, round, and reactive to light. Right eye exhibits no discharge. Left eye exhibits no discharge. No scleral icterus.  Corrective lenses  Neck: Normal range of motion. Neck supple. No JVD present. No tracheal deviation present. No thyromegaly present.  Cardiovascular: Normal rate, regular rhythm, normal heart sounds and intact distal pulses.  Exam reveals no gallop and no  friction rub.   No murmur heard. Pulmonary/Chest: Effort normal and breath sounds normal. No respiratory distress. She has no wheezes. She has no rales. She exhibits no tenderness.  Abdominal: Soft. Bowel sounds are normal. She exhibits no distension and no mass. There is no tenderness. There is no rebound.  Musculoskeletal: Normal range of motion. She exhibits no edema or tenderness.  Bilateral bunions  Lymphadenopathy:    She has no cervical adenopathy.  Neurological: She is alert and oriented to person, place, and time. No cranial nerve deficit. Coordination normal.  Skin: No rash noted. No erythema. No pallor.  Psychiatric: She has a normal mood and affect. Judgment and thought content normal.     Labs reviewed: Office Visit on 10/17/2014  Component Date Value Ref Range Status  . RPR Ser Ql 10/17/2014 NON REAC  NON REAC Final  . HCV Ab 10/17/2014 NEGATIVE  NEGATIVE Final  . HIV 1&2 Ab, 4th Generation 10/17/2014 NONREACTIVE  NONREACTIVE Final   Comment:   A NONREACTIVE HIV Ag/Ab result does not exclude HIV infection since the time frame for seroconversion is variable. If acute HIV infection is suspected, a HIV-1 RNA Qualitative TMA test is recommended.   HIV-1/2 Antibody Diff         Not indicated. HIV-1 RNA, Qual TMA           Not indicated.   PLEASE NOTE: This information has been disclosed to you from records whose confidentiality may be protected by state law. If your state requires such protection, then the state law prohibits you from making any further disclosure of the information without the specific written consent of the person to whom it pertains, or as otherwise permitted by law. A general authorization for the release of medical or other information is NOT sufficient for this purpose.   The performance of this assay has not been clinically validated in patients less than 62 years old.   . Hepatitis B Surface Ag 10/17/2014 NEGATIVE  NEGATIVE Final  . HSV 1  Glycoprotein G Ab, IgG 10/17/2014 4.87*  Final   Comment:      IV = Index Value              < 0.90 IV              Negative              0.90-1.10 IV           Equivocal              > 1.10 IV              Positive   . HSV 2 Glycoprotein G Ab, IgG 10/17/2014 7.20*  Final  Comment:      IV = Index Value              < 0.90 IV              Negative              0.90-1.10 IV           Equivocal              > 1.10 IV              Positive   . Herpes Simplex Vrs I&II-IgM Ab (EI* 10/17/2014 0.89   Final   Comment:                 <=0.90 . . . . . . Marland Kitchen NEGATIVE               0.91-1.09. . . . . Marland Kitchen EQUIVOCAL               >=1.10 . . . . . . Marland Kitchen POSITIVE                                                                                                   The results obtained with the HSV 1 & 2 IgM test should serve only as an aid to diagnosis and should not be interpreted as diagnostic by themselves. Heterotypic IgM antibody responses may occur in patients with a history of infection with other Herpes viruses, including Epstein-Barr virus and Varicella zoster virus, and give false positive results in HSV 1 & 2 IgM tests.  This test does not distinguish between HSV 1 and HSV 2.  A positive HSV IgM may indicate primary infection, but IgM antibody can persist 12 or more months after primary infection.  For confirmation, if clinically indicated, positive IgM results could be followed with the test for HSV 1 & 2 IgG glycoprotein (test code [id number]) in 4-6 weeks.   . CT Probe RNA 10/17/2014 NEGATIVE   Final  . GC Probe RNA 10/17/2014 NEGATIVE   Final   Comment:                                                                                         **Normal Reference Range: Negative**         Assay performed using the Gen-Probe APTIMA COMBO2 (R) Assay.   Acceptable specimen types for this assay include APTIMA Swabs (Unisex, endocervical, urethral, or vaginal), first void urine, and ThinPrep liquid  based cytology samples.   Office Visit on 09/18/2014  Component Date Value Ref Range Status  . HM Dexa Scan 01/17/2014 Low bone density   Final  Appointment on 09/13/2014  Component Date Value Ref Range Status  .  Glucose 09/13/2014 93  65 - 99 mg/dL Final  . BUN 09/13/2014 17  8 - 27 mg/dL Final  . Creatinine, Ser 09/13/2014 0.88  0.57 - 1.00 mg/dL Final  . GFR calc non Af Amer 09/13/2014 65  >59 mL/min/1.73 Final  . GFR calc Af Amer 09/13/2014 75  >59 mL/min/1.73 Final  . BUN/Creatinine Ratio 09/13/2014 19  11 - 26 Final  . Sodium 09/13/2014 140  134 - 144 mmol/L Final  . Potassium 09/13/2014 4.8  3.5 - 5.2 mmol/L Final  . Chloride 09/13/2014 104  97 - 108 mmol/L Final  . CO2 09/13/2014 22  18 - 29 mmol/L Final  . Calcium 09/13/2014 9.9  8.7 - 10.3 mg/dL Final  . Total Protein 09/13/2014 6.5  6.0 - 8.5 g/dL Final  . Albumin 09/13/2014 4.3  3.5 - 4.8 g/dL Final  . Globulin, Total 09/13/2014 2.2  1.5 - 4.5 g/dL Final  . Albumin/Globulin Ratio 09/13/2014 2.0  1.1 - 2.5 Final  . Bilirubin Total 09/13/2014 <0.2  0.0 - 1.2 mg/dL Final  . Alkaline Phosphatase 09/13/2014 52  39 - 117 IU/L Final  . AST 09/13/2014 20  0 - 40 IU/L Final  . ALT 09/13/2014 18  0 - 32 IU/L Final     Assessment/Plan  1. History of herpes genitalis Patient has never had typical clustered vesicles. She does have an uncomfortable feeling from time to time in the genital area. Recent tests are positive for HSV-1 and 2.  2. Essential hypertension Improved  3. Loss of weight Stable

## 2014-12-23 ENCOUNTER — Other Ambulatory Visit: Payer: Self-pay | Admitting: Internal Medicine

## 2014-12-24 DIAGNOSIS — Z23 Encounter for immunization: Secondary | ICD-10-CM | POA: Diagnosis not present

## 2015-03-14 ENCOUNTER — Other Ambulatory Visit: Payer: Self-pay | Admitting: Internal Medicine

## 2015-03-25 ENCOUNTER — Other Ambulatory Visit: Payer: Self-pay | Admitting: Internal Medicine

## 2015-04-18 IMAGING — CT CT ABD-PELV W/ CM
3 of 5 series · 12 of 36 positions shown, 18 images · IV contrast (READICAT/WATER & [ID] OMNI 300)
Comparison: None.

CLINICAL DATA: Elevated liver function tests, weight loss,
constipation and diarrhea

EXAM:
CT ABDOMEN AND PELVIS WITH CONTRAST
TECHNIQUE: Multidetector CT imaging of the abdomen and pelvis was performed
using the standard protocol following bolus administration of
intravenous contrast.
CONTRAST:  100mL OMNIPAQUE IOHEXOL 300 MG/ML  SOLN

[Series 3: abd/pelvis with · axial · 0.62mm/px · z∈[-352,-38]mm · 8 of 81 slices shown, 13 images]
[im 9/81  soft-tissue]
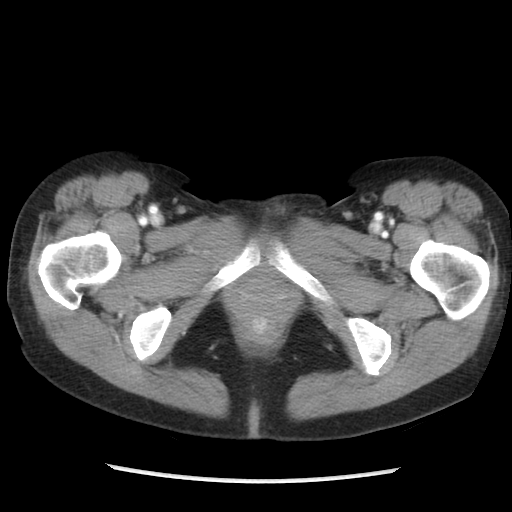
[im 9/81  bone]
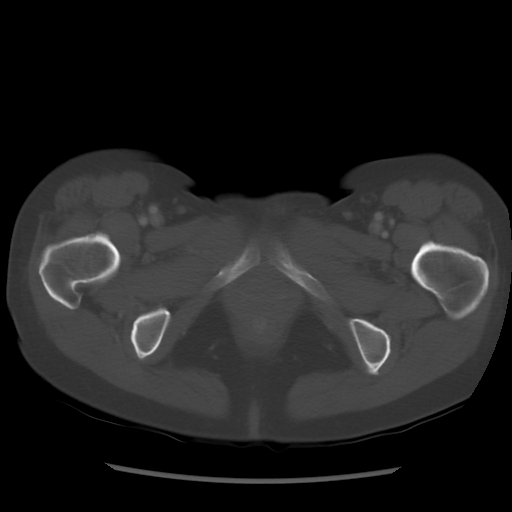
[im 18/81  soft-tissue]
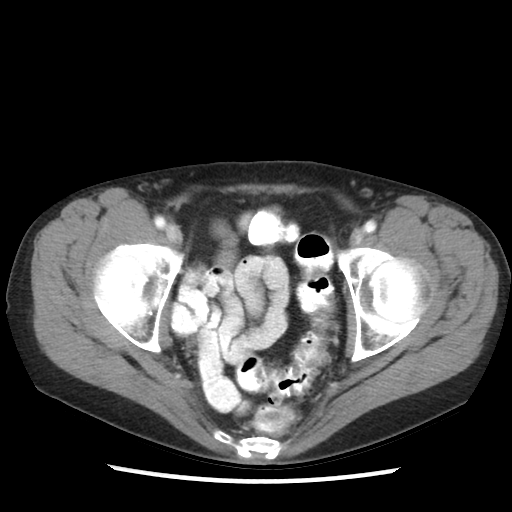
[im 27/81  soft-tissue]
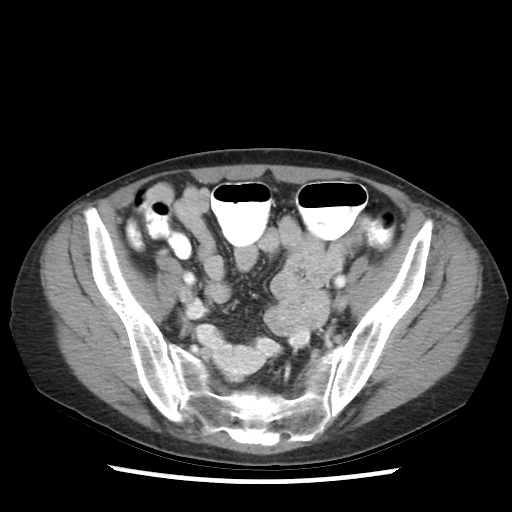
[im 36/81  soft-tissue]
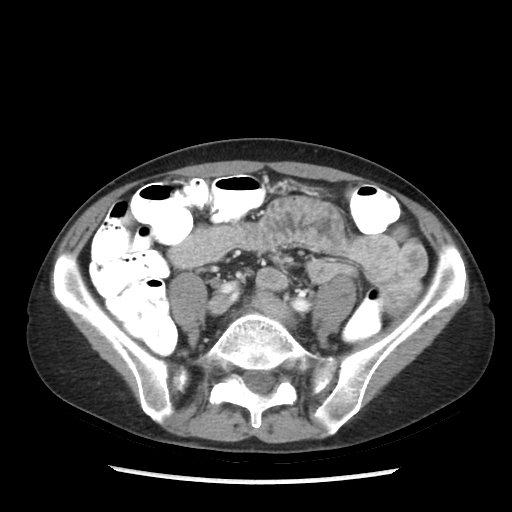
[im 45/81  soft-tissue]
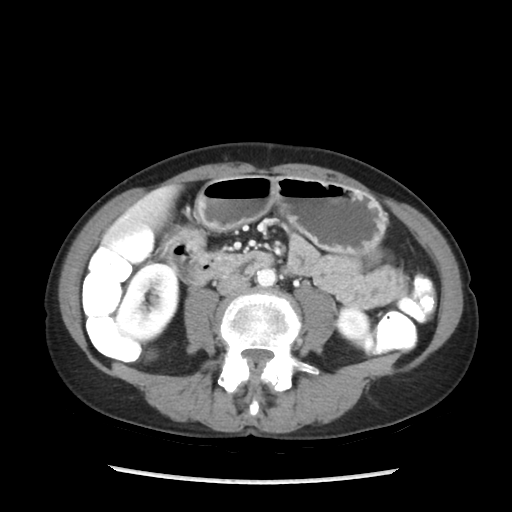
[im 45/81  lung]
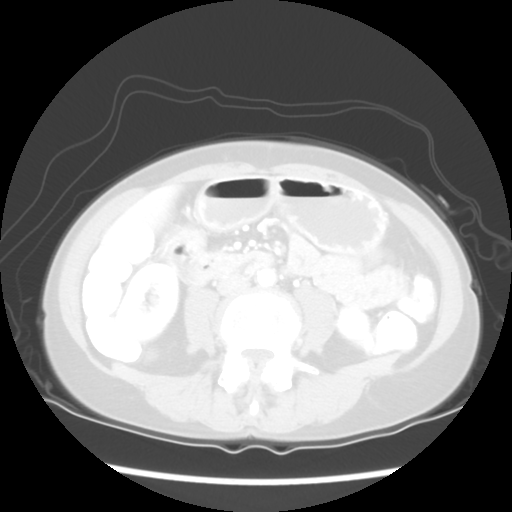
[im 54/81  soft-tissue]
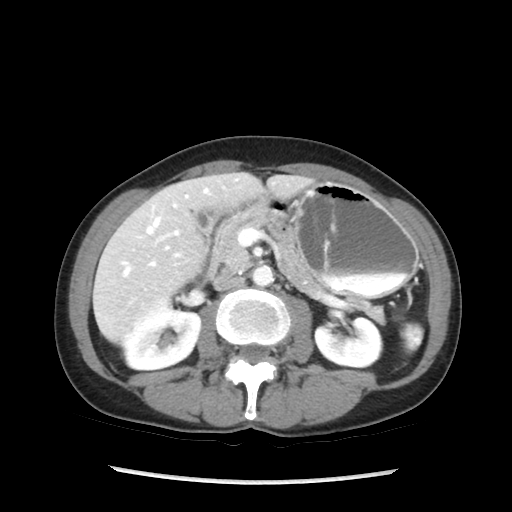
[im 54/81  lung]
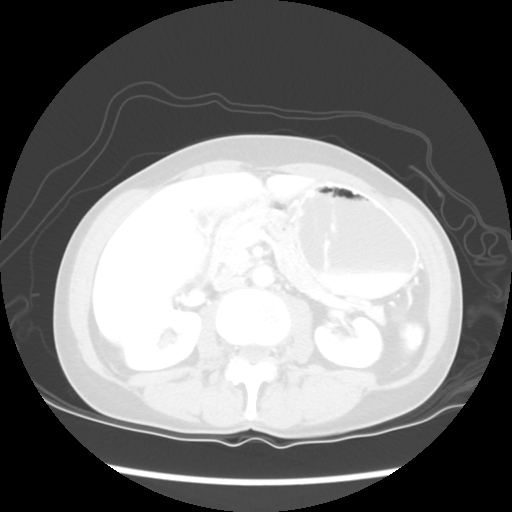
[im 63/81  soft-tissue]
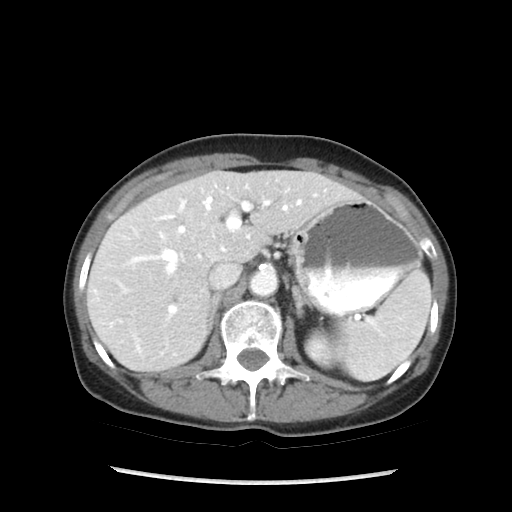
[im 63/81  lung]
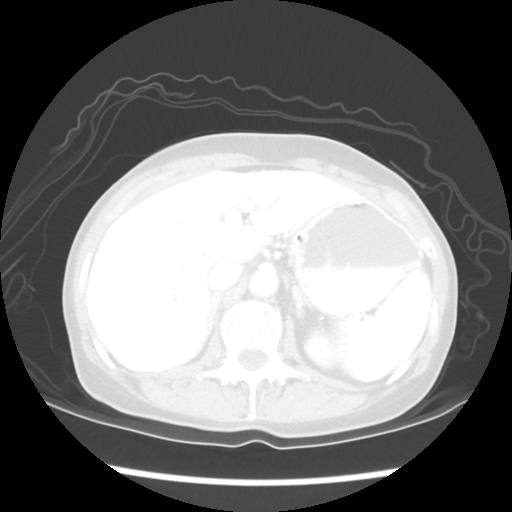
[im 72/81  soft-tissue]
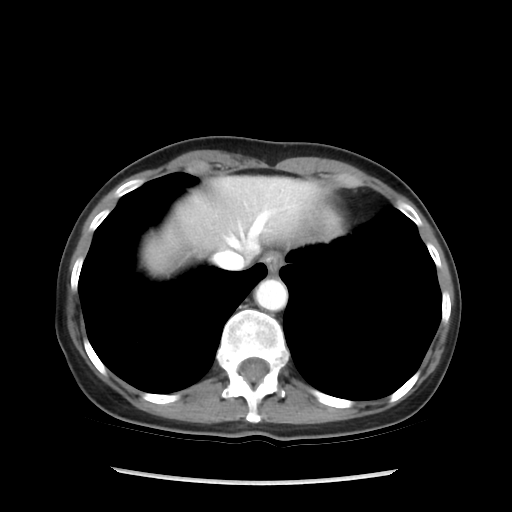
[im 72/81  lung]
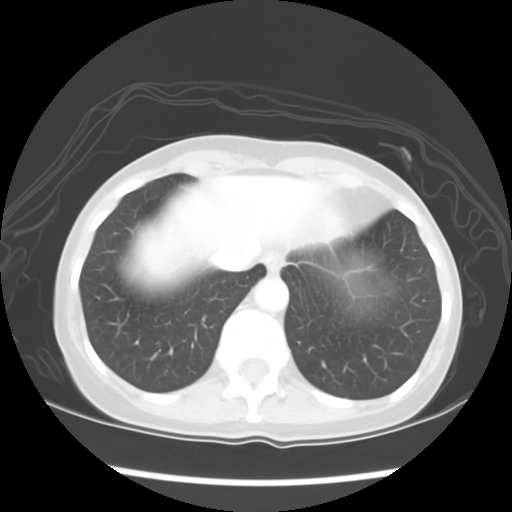

[Series 601: coronal body · coronal · 0.80mm/px · 1 of 95 slices shown, 2 images]
[im 32/95  soft-tissue]
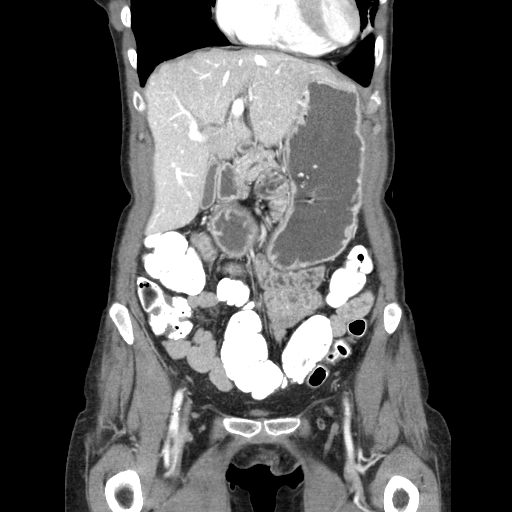
[im 32/95  bone]
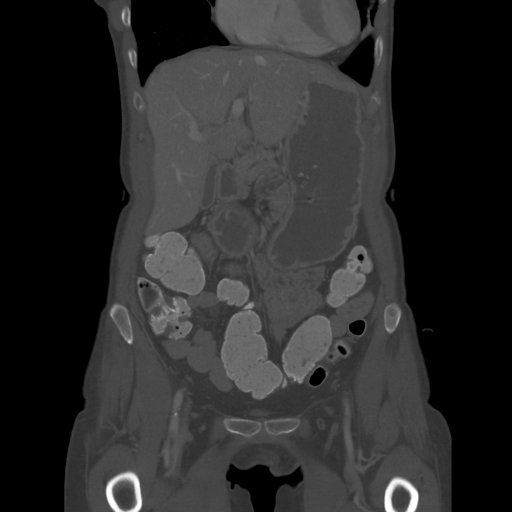

[Series 602: sagittal body · sagittal · 0.80mm/px · 3 of 128 slices shown]
[im 9/128  soft-tissue]
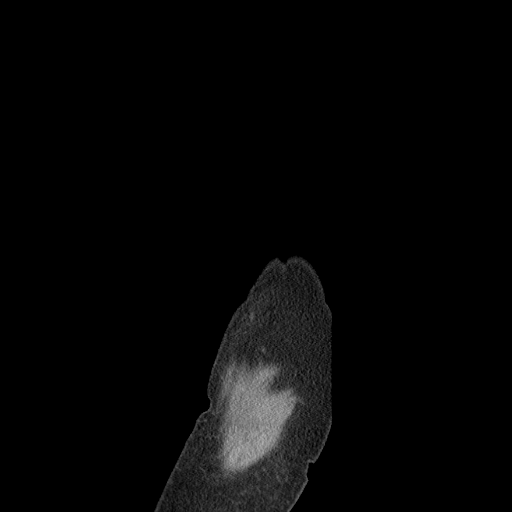
[im 26/128  soft-tissue]
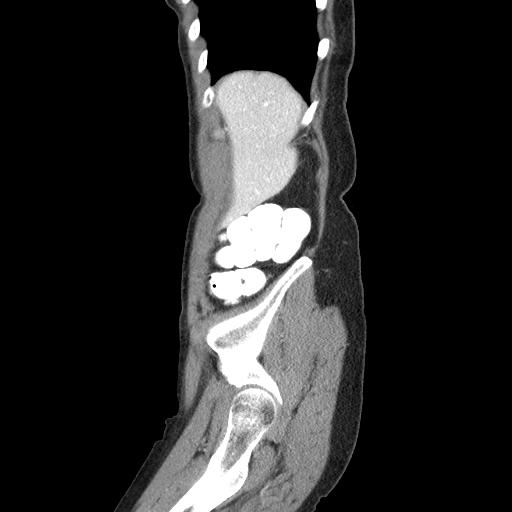
[im 43/128  soft-tissue]
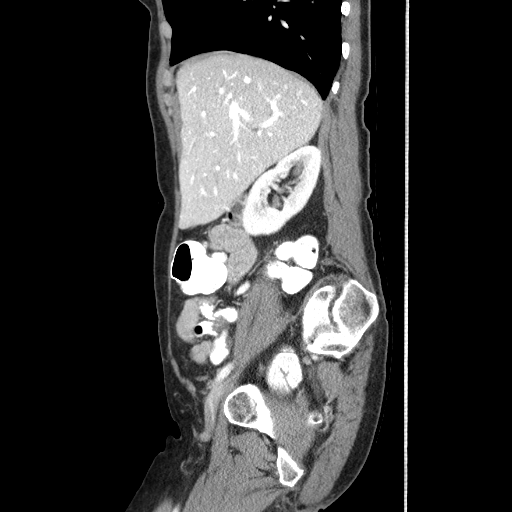

[12 of 36 positions shown; findings below may reference images not displayed]

FINDINGS: Linear scarring is noted in the lingula and right middle lobe. The
liver enhances with no focal abnormality noted. The attenuation of
the liver is unremarkable. No ductal dilatation is seen. The
gallbladder is contracted and no calcified gallstones are seen. The
pancreas is normal in size and the pancreatic duct is not dilated.
The adrenal glands and spleen are unremarkable. The stomach is
moderately fluid distended with no abnormality noted. The kidneys
enhance and no renal calculi are noted. On delayed images, the
pelvocaliceal systems are unremarkable and the proximal ureters are
normal in caliber. The abdominal aorta is normal in caliber with
moderate atheromatous change present. No adenopathy is seen

The urinary bladder is decompressed and cannot be evaluated. The
uterus has previously been resected. No adnexal lesion is seen, and
no free fluid is noted within the pelvis. Multiple rectosigmoid
colonic diverticula are noted with changes of diverticulosis as
well. There is slight irregularity of a short segment of the
proximal transverse colon most likely due to peristalsis with no
abnormal soft tissue evident at that site to indicate malignancy.
Clinical correlation is recommended. The terminal ileum is
unremarkable as is the appendix. The lumbar vertebrae are in normal
alignment. A subtle pars defect is noted on the left at L5.
Intervertebral disc spaces are relatively normal. There is
degenerative change in the facet joints of the lower lumbar spine.
IMPRESSION: 1. No hepatic lesion is seen.
2. Probable focal peristalsis of the proximal transverse colon, but
a subtle colonic lesion would be difficult to exclude. No definite
associated soft tissue mass is seen however.
3. Rectosigmoid colon diverticula with diverticulosis.
4. Subtle pars defect on the left at L5 with degenerative change
throughout the facet joints of the lower lumbar spine.

## 2015-05-13 ENCOUNTER — Ambulatory Visit (INDEPENDENT_AMBULATORY_CARE_PROVIDER_SITE_OTHER): Payer: Medicare Other | Admitting: Internal Medicine

## 2015-05-13 ENCOUNTER — Ambulatory Visit: Payer: Medicare Other | Admitting: Internal Medicine

## 2015-05-13 ENCOUNTER — Encounter: Payer: Self-pay | Admitting: Internal Medicine

## 2015-05-13 VITALS — BP 152/86 | HR 68 | Temp 98.0°F | Ht 61.0 in | Wt 99.0 lb

## 2015-05-13 DIAGNOSIS — I10 Essential (primary) hypertension: Secondary | ICD-10-CM | POA: Diagnosis not present

## 2015-05-13 DIAGNOSIS — R634 Abnormal weight loss: Secondary | ICD-10-CM | POA: Diagnosis not present

## 2015-05-13 DIAGNOSIS — R202 Paresthesia of skin: Secondary | ICD-10-CM

## 2015-05-13 DIAGNOSIS — R059 Cough, unspecified: Secondary | ICD-10-CM

## 2015-05-13 DIAGNOSIS — R05 Cough: Secondary | ICD-10-CM | POA: Diagnosis not present

## 2015-05-13 NOTE — Progress Notes (Signed)
Patient ID: Caroline Snyder, female   DOB: [birth year], 74 y.o.   MRN: [id number]    Facility  [facility]    Place of Service:   OFFICE    Allergies  Allergen Reactions  . Vioxx [Rofecoxib] Nausea Only    Pt. Can't remember what reaction was but asks to leave on her profile.     Chief Complaint  Patient presents with  . Medical Management of Chronic Issues    blood pressure, weight loss. Here with son Caroline Snyder  . Cough    gets thick phlegm when she lays down. Son wonders about COPD, second hand smoke  . Leg Problem    right lower leg area tingling    HPI:  Cough is getting wore. Some thick phlegm at night. No fevers. No sweats. No blood in the sputum. Yellow color to the sputum.  Weight continues to be a worry. She says she is trying to eat more. Drinks mild and eats desert at night. She is very active in dancing. Sort of a picky eater. Eats a solid meal at least once daily. Breakfast may be fruit bars, cheese toast. Mc Muffin from [place], etc.  Generally eats a lunch with her atient. Never cooks at home.Generally eats at [employer]. Usually takes something home.  More frequent with stools. Stools are looser. No blood in the stool. No stools at night.   Nocturia x 3-4.   Medications: Patient's Medications  New Prescriptions   No medications on file  Previous Medications   ACYCLOVIR (ZOVIRAX) 200 MG CAPSULE    One daily as needed to prevent vaginal discomfort related to viral infection.   ASPIRIN 81 MG TABLET    Take 81 mg by mouth daily. Take 1 tablet daily to prevent heart attack, stroke and clotting.   CHOLECALCIFEROL (HM VITAMIN D3) 2000 UNITS CAPS    Take by mouth. Take 1 tablet daily   CRANBERRY 250 MG TABS    Take by mouth. Take 1 tablet daily   CYCLOSPORINE (RESTASIS) 0.05 % OPHTHALMIC EMULSION    1 drop 2 (two) times daily.   ESTRADIOL (ESTRACE) 0.1 MG/GM VAGINAL CREAM    Place 6.22 Applicatorfuls vaginally 2 (two) times a week.   MULTIPLE VITAMIN (MULTI VITAMIN DAILY PO)     Take by mouth. Centrum Silver for Women Take 1 tablet daily   MULTIPLE VITAMINS-MINERALS (ICAPS) TABS    Take 1 tablet by mouth 2 (two) times daily.   NIZATIDINE (AXID) 150 MG CAPSULE    Take 1 capsule (150 mg total) by mouth 2 (two) times daily as needed. Take one tablet twice daily as needed to help stomach.   PRAVASTATIN (PRAVACHOL) 40 MG TABLET    Take 40 mg by mouth daily.  Modified Medications   No medications on file  Discontinued Medications   ACETAMINOPHEN (TYLENOL) 500 MG TABLET    Take 1 tablet (500 mg total) by mouth every 4 (four) hours as needed for moderate pain.   ALPRAZOLAM (XANAX) 0.5 MG TABLET    TAKE 1/2 TO 1 TABLET BY MOUTH EVERY NIGHT AT BEDTIME FOR REST   PRAVASTATIN (PRAVACHOL) 40 MG TABLET    TAKE 1 TABLET BY MOUTH EVERY DAY    Review of Systems  Constitutional: Positive for fatigue and unexpected weight change.  HENT: Negative.   Eyes: Negative.   Respiratory: Positive for cough.   Cardiovascular: Positive for palpitations.  Gastrointestinal: Negative.   Genitourinary:       History of stress incontinence. Some  increase in frequency. Denies nocturia or dysuria. Complains of pain on intercourse.  Musculoskeletal: Negative.   Skin: Negative.   Neurological:       Patient has noted an area of altered sensation of the right anterior thigh just below a cluster of superficial varicose veins. It is not painful.  Hematological: Negative.   Psychiatric/Behavioral:       Some sadness.    Filed Vitals:   05/13/15 1640  BP: 152/86  Pulse: 68  Temp: 98 F (36.7 C)  TempSrc: Oral  Height: _0  (1.549 m)  Weight: 99 lb (44.906 kg)  SpO2: 98%   Body mass index is 18.72 kg/(m^2).  Physical Exam  Constitutional: She is oriented to person, place, and time. She appears well-developed and well-nourished. No distress.  HENT:  Right Ear: External ear normal.  Left Ear: External ear normal.  Nose: Nose normal.  Mouth/Throat: Oropharynx is clear and moist.    Eyes: Conjunctivae are normal. Pupils are equal, round, and reactive to light. Right eye exhibits no discharge. Left eye exhibits no discharge. No scleral icterus.  Corrective lenses  Neck: Normal range of motion. Neck supple. No JVD present. No tracheal deviation present. No thyromegaly present.  Cardiovascular: Normal rate, regular rhythm, normal heart sounds and intact distal pulses.  Exam reveals no gallop and no friction rub.   No murmur heard. Pulmonary/Chest: Effort normal. No respiratory distress. She has no wheezes. She has rales (rattling in chest). She exhibits no tenderness.  Persistent coughing.  Abdominal: Soft. Bowel sounds are normal. She exhibits no distension and no mass. There is no tenderness. There is no rebound.  Musculoskeletal: Normal range of motion. She exhibits no edema or tenderness.  Bilateral bunions  Lymphadenopathy:    She has no cervical adenopathy.  Neurological: She is alert and oriented to person, place, and time. No cranial nerve deficit. Coordination normal.  Skin: No rash noted. No erythema. No pallor.  Right leg anteriorly has a cluster of superficial varicose veins.  Psychiatric: She has a normal mood and affect. Judgment and thought content normal.    Labs reviewed: Lab Summary Latest Ref Rng 09/13/2014 03/11/2014 12/19/2013  Hemoglobin 12.2-16.2 g/dL (None) (None) (None)  Hematocrit 37.7-47.9 % (None) (None) (None)  White count 4.6-10.2 K/uL (None) (None) (None)  Platelet count - (None) (None) (None)  Sodium 134 - 144 mmol/L 140 142 (None)  Potassium 3.5 - 5.2 mmol/L 4.8 4.4 (None)  Calcium 8.7 - 10.3 mg/dL 9.9 9.7 (None)  Phosphorus - (None) (None) (None)  Creatinine 0.57 - 1.00 mg/dL 0.88 0.81 (None)  AST 0 - 40 IU/L _1 Alk Phos 39 - 117 IU/L 52 54 52  Bilirubin 0.0 - 1.2 mg/dL <0.2 0.2 0.5  Glucose 65 - 99 mg/dL 93 99 (None)  Cholesterol - (None) (None) (None)  HDL cholesterol >39 mg/dL (None) 90 (None)  Triglycerides 0 - 149  mg/dL (None) 119 (None)  LDL Direct - (None) (None) (None)  LDL Calc 0 - 99 mg/dL (None) 140(H) (None)  Total protein 6.0 - 8.3 g/dL (None) (None) 6.7  Albumin 3.5 - 4.8 g/dL 4.3 4.5 4.0   Lab Results  Component Value Date   TSH 3.250 08/30/2013   Lab Results  Component Value Date   BUN 17 09/13/2014   No results found for: HGBA1C  Assessment/Plan  1. Loss of weight - TSH - Comprehensive metabolic panel - CBC With Differential - TB Skin Test - DG Chest 2 View; Future  2.  Essential hypertension - Comprehensive metabolic panel  3. Cough  - CBC With Differential - Respiratory or Resp and Sputum Culture - Culture, AFB with Smear - TB Skin Test - DG Chest 2 View; Future  4. Paresthesia Altered sensation in the right thigh anteriorly just below a cluster of superficial varicose veins. I advised patient that I did not think this was of any clinical significance. She had worried that she might have a cancer of the leg.

## 2015-05-14 DIAGNOSIS — Z23 Encounter for immunization: Secondary | ICD-10-CM | POA: Diagnosis not present

## 2015-05-14 LAB — COMPREHENSIVE METABOLIC PANEL
ALK PHOS: 58 IU/L (ref 39–117)
ALT: 19 IU/L (ref 0–32)
AST: 22 IU/L (ref 0–40)
Albumin/Globulin Ratio: 1.9 (ref 1.1–2.5)
Albumin: 4.6 g/dL (ref 3.5–4.8)
BUN/Creatinine Ratio: 21 (ref 11–26)
BUN: 16 mg/dL (ref 8–27)
CHLORIDE: 103 mmol/L (ref 97–108)
CO2: 29 mmol/L (ref 18–29)
Calcium: 10.1 mg/dL (ref 8.7–10.3)
Creatinine, Ser: 0.77 mg/dL (ref 0.57–1.00)
GFR calc Af Amer: 87 mL/min/{1.73_m2} (ref >59)
GFR calc non Af Amer: 76 mL/min/{1.73_m2} (ref >59)
GLUCOSE: 84 mg/dL (ref 65–99)
Globulin, Total: 2.4 g/dL (ref 1.5–4.5)
Potassium: 4.2 mmol/L (ref 3.5–5.2)
Sodium: 145 mmol/L — ABNORMAL HIGH (ref 134–144)
Total Protein: 7 g/dL (ref 6.0–8.5)

## 2015-05-14 LAB — CBC WITH DIFFERENTIAL
Basophils Absolute: 0 10*3/uL (ref 0.0–0.2)
Basos: 1 %
EOS (ABSOLUTE): 0.2 10*3/uL (ref 0.0–0.4)
EOS: 4 %
HEMATOCRIT: 38.4 % (ref 34.0–46.6)
HEMOGLOBIN: 13 g/dL (ref 11.1–15.9)
Immature Grans (Abs): 0 10*3/uL (ref 0.0–0.1)
Immature Granulocytes: 0 %
Lymphocytes Absolute: 1.3 10*3/uL (ref 0.7–3.1)
Lymphs: 21 %
MCH: 30.9 pg (ref 26.6–33.0)
MCHC: 33.9 g/dL (ref 31.5–35.7)
MCV: 91 fL (ref 79–97)
MONOCYTES: 7 %
MONOS ABS: 0.4 10*3/uL (ref 0.1–0.9)
Neutrophils Absolute: 4.2 10*3/uL (ref 1.4–7.0)
Neutrophils: 67 %
RBC: 4.21 x10E6/uL (ref 3.77–5.28)
RDW: 13.1 % (ref 12.3–15.4)
WBC: 6.3 10*3/uL (ref 3.4–10.8)

## 2015-05-14 LAB — TSH: TSH: 1.73 u[IU]/mL (ref 0.450–4.500)

## 2015-05-15 ENCOUNTER — Ambulatory Visit
Admission: RE | Admit: 2015-05-15 | Discharge: 2015-05-15 | Disposition: A | Discharge status: Home / Self Care | Payer: Medicare Other | Source: Ambulatory Visit | Attending: Internal Medicine | Admitting: Internal Medicine

## 2015-05-15 ENCOUNTER — Other Ambulatory Visit: Payer: Medicare Other

## 2015-05-15 DIAGNOSIS — R05 Cough: Secondary | ICD-10-CM

## 2015-05-15 DIAGNOSIS — R634 Abnormal weight loss: Secondary | ICD-10-CM

## 2015-05-15 DIAGNOSIS — R059 Cough, unspecified: Secondary | ICD-10-CM

## 2015-05-15 DIAGNOSIS — R053 Chronic cough: Secondary | ICD-10-CM

## 2015-05-18 LAB — LOWER RESPIRATORY CULTURE

## 2015-05-22 ENCOUNTER — Other Ambulatory Visit: Payer: Self-pay | Admitting: Internal Medicine

## 2015-05-23 ENCOUNTER — Other Ambulatory Visit: Payer: Self-pay | Admitting: *Deleted

## 2015-05-23 ENCOUNTER — Other Ambulatory Visit: Payer: Self-pay | Admitting: Internal Medicine

## 2015-05-23 MED ORDER — MIRTAZAPINE 15 MG PO TABS: 15.0000 mg | ORAL_TABLET | Freq: Every day | ORAL | Status: DC

## 2015-05-23 NOTE — Telephone Encounter (Signed)
Left message on voicemail for patient to return call when available. Reason for call: Medication not on med list, need to discuss reason for request.

## 2015-05-23 NOTE — Telephone Encounter (Signed)
Mirtazapine 15 mg ordered for the patient for sleep per Dr. Jeanmarie Hubert. Medication was sent to pharmacy.

## 2015-05-30 ENCOUNTER — Ambulatory Visit (INDEPENDENT_AMBULATORY_CARE_PROVIDER_SITE_OTHER): Payer: Medicare Other

## 2015-05-30 DIAGNOSIS — Z1231 Encounter for screening mammogram for malignant neoplasm of breast: Secondary | ICD-10-CM | POA: Diagnosis not present

## 2015-05-30 DIAGNOSIS — Z111 Encounter for screening for respiratory tuberculosis: Secondary | ICD-10-CM

## 2015-05-30 LAB — HM MAMMOGRAPHY: HM Mammogram: NEGATIVE

## 2015-06-02 ENCOUNTER — Encounter: Payer: Self-pay | Admitting: *Deleted

## 2015-06-20 DIAGNOSIS — L918 Other hypertrophic disorders of the skin: Secondary | ICD-10-CM | POA: Diagnosis not present

## 2015-06-20 DIAGNOSIS — D485 Neoplasm of uncertain behavior of skin: Secondary | ICD-10-CM | POA: Diagnosis not present

## 2015-06-20 DIAGNOSIS — L821 Other seborrheic keratosis: Secondary | ICD-10-CM | POA: Diagnosis not present

## 2015-06-20 DIAGNOSIS — D225 Melanocytic nevi of trunk: Secondary | ICD-10-CM | POA: Diagnosis not present

## 2015-06-20 DIAGNOSIS — D2262 Melanocytic nevi of left upper limb, including shoulder: Secondary | ICD-10-CM | POA: Diagnosis not present

## 2015-06-20 DIAGNOSIS — C44319 Basal cell carcinoma of skin of other parts of face: Secondary | ICD-10-CM | POA: Diagnosis not present

## 2015-06-20 DIAGNOSIS — B078 Other viral warts: Secondary | ICD-10-CM | POA: Diagnosis not present

## 2015-06-20 DIAGNOSIS — D1801 Hemangioma of skin and subcutaneous tissue: Secondary | ICD-10-CM | POA: Diagnosis not present

## 2015-06-20 DIAGNOSIS — C44519 Basal cell carcinoma of skin of other part of trunk: Secondary | ICD-10-CM | POA: Diagnosis not present

## 2015-06-20 DIAGNOSIS — L814 Other melanin hyperpigmentation: Secondary | ICD-10-CM | POA: Diagnosis not present

## 2015-06-20 DIAGNOSIS — B079 Viral wart, unspecified: Secondary | ICD-10-CM | POA: Diagnosis not present

## 2015-06-24 ENCOUNTER — Ambulatory Visit (INDEPENDENT_AMBULATORY_CARE_PROVIDER_SITE_OTHER): Payer: Medicare Other | Admitting: Internal Medicine

## 2015-06-24 ENCOUNTER — Encounter: Payer: Self-pay | Admitting: Internal Medicine

## 2015-06-24 VITALS — BP 126/80 | HR 67 | Temp 98.1°F | Resp 14 | Ht 61.0 in | Wt 100.8 lb

## 2015-06-24 DIAGNOSIS — R634 Abnormal weight loss: Secondary | ICD-10-CM | POA: Diagnosis not present

## 2015-06-24 DIAGNOSIS — R05 Cough: Secondary | ICD-10-CM

## 2015-06-24 DIAGNOSIS — R059 Cough, unspecified: Secondary | ICD-10-CM

## 2015-06-24 NOTE — Progress Notes (Signed)
Patient ID: Caroline Snyder, female   DOB: May 29, 1941, 74 y.o.   MRN: [id number]    Facility  [facility]    Place of Service:   OFFICE    Allergies  Allergen Reactions  . Vioxx [Rofecoxib] Nausea Only    Pt. Can't remember what reaction was but asks to leave on her profile.     Chief Complaint  Patient presents with  . Medical Management of Chronic Issues    6 week follow-up on weight loss and HTN. Patient c/o ongoing cough.   . Nevus    Examine mole on the back side of left arm- seen dermatologist last Friday    HPI:  Patient is here for a follow-up visit in regards to her weight loss, cough, and loss of energy. She says that she is feeling better than on the last visit. She has regained 1 pound. Cough continues to be intermittent and mostly dry. Energy level is doing well. She continues to contra dance (a form of call and response dancing). TB cultures are still pending, but the acid-fast body smears were negative. Chest x-ray 05/15/2015 was unremarkable.  She has been to see her dermatologist, Dr. Rolm Bookbinder. She has attended several lesions including freezing a seborrheic keratosis of the left triceps (not at goal), freezing of the lesion on the right upper cheek, and burning of a lesion at the left upper chest. All areas appear to be healing. The seborrheic keratosis was quite thick and the cryotherapy may not fully remove this lesion.  Medications: Patient's Medications  New Prescriptions   No medications on file  Previous Medications   ACYCLOVIR (ZOVIRAX) 200 MG CAPSULE    One daily as needed to prevent vaginal discomfort related to viral infection.   ASPIRIN 81 MG TABLET    Take 81 mg by mouth daily. Take 1 tablet daily to prevent heart attack, stroke and clotting.   CHOLECALCIFEROL (HM VITAMIN D3) 2000 UNITS CAPS    Take by mouth. Take 1 tablet daily   CRANBERRY 250 MG TABS    Take by mouth. Take 1 tablet daily   CYCLOSPORINE (RESTASIS) 0.05 % OPHTHALMIC EMULSION    1 drop 2  (two) times daily.   ESTRADIOL (ESTRACE) 0.1 MG/GM VAGINAL CREAM    Place 0.35 Applicatorfuls vaginally 2 (two) times a week.   MIRTAZAPINE (REMERON) 15 MG TABLET    Take 1 tablet (15 mg total) by mouth at bedtime. For sleep   MULTIPLE VITAMIN (MULTI VITAMIN DAILY PO)    Take by mouth. Centrum Silver for Women Take 1 tablet daily   MULTIPLE VITAMINS-MINERALS (ICAPS) TABS    Take 1 tablet by mouth 2 (two) times daily.   NIZATIDINE (AXID) 150 MG CAPSULE    Take 1 capsule (150 mg total) by mouth 2 (two) times daily as needed. Take one tablet twice daily as needed to help stomach.   PRAVASTATIN (PRAVACHOL) 40 MG TABLET    Take 40 mg by mouth daily.  Modified Medications   No medications on file  Discontinued Medications   FLUZONE HIGH-DOSE 0.5 ML SUSY    ADM 0.5ML IM UTD    Review of Systems  Constitutional: Positive for unexpected weight change (Weight loss is improving now). Negative for fatigue.  HENT: Negative.   Eyes: Negative.   Respiratory: Positive for cough.   Cardiovascular: Positive for palpitations.  Gastrointestinal: Negative.   Genitourinary:       History of stress incontinence. Some increase in frequency. Denies nocturia or  dysuria. Complains of pain on intercourse.  Musculoskeletal: Negative.   Skin: Negative.   Neurological:       Patient has noted an area of altered sensation of the right anterior thigh just below a cluster of superficial varicose veins. It is not painful.  Hematological: Negative.   Psychiatric/Behavioral:       Some sadness.    Filed Vitals:   06/24/15 1629  BP: 126/80  Pulse: 67  Temp: 98.1 F (36.7 C)  TempSrc: Oral  Resp: 14  Height: 5' 1" (1.549 m)  Weight: 100 lb 12.8 oz (45.723 kg)  SpO2: 97%   Body mass index is 19.06 kg/(m^2).  Physical Exam  Constitutional: She is oriented to person, place, and time. She appears well-developed and well-nourished. No distress.  HENT:  Right Ear: External ear normal.  Left Ear: External ear  normal.  Nose: Nose normal.  Mouth/Throat: Oropharynx is clear and moist.  Eyes: Conjunctivae are normal. Pupils are equal, round, and reactive to light. Right eye exhibits no discharge. Left eye exhibits no discharge. No scleral icterus.  Corrective lenses  Neck: Normal range of motion. Neck supple. No JVD present. No tracheal deviation present. No thyromegaly present.  Cardiovascular: Normal rate, regular rhythm, normal heart sounds and intact distal pulses.  Exam reveals no gallop and no friction rub.   No murmur heard. Pulmonary/Chest: Effort normal. No respiratory distress. She has no wheezes. She has no rales. She exhibits no tenderness.  Persistent coughing.  Abdominal: Soft. Bowel sounds are normal. She exhibits no distension and no mass. There is no tenderness. There is no rebound.  Musculoskeletal: Normal range of motion. She exhibits no edema or tenderness.  Bilateral bunions  Lymphadenopathy:    She has no cervical adenopathy.  Neurological: She is alert and oriented to person, place, and time. No cranial nerve deficit. Coordination normal.  Skin: No rash noted. No erythema. No pallor.  Right leg anteriorly has a cluster of superficial varicose veins.  Psychiatric: She has a normal mood and affect. Judgment and thought content normal.    Labs reviewed: Lab Summary Latest Ref Rng 05/13/2015 09/13/2014 03/11/2014  Hemoglobin 11.1 - 15.9 g/dL 13.0 (None) (None)  Hematocrit 34.0 - 46.6 % 38.4 (None) (None)  White count 3.4 - 10.8 x10E3/uL 6.3 (None) (None)  Platelet count - (None) (None) (None)  Sodium 134 - 144 mmol/L 145(H) 140 142  Potassium 3.5 - 5.2 mmol/L 4.2 4.8 4.4  Calcium 8.7 - 10.3 mg/dL 10.1 9.9 9.7  Phosphorus - (None) (None) (None)  Creatinine 0.57 - 1.00 mg/dL 0.77 0.88 0.81  AST 0 - 40 IU/L _0 Alk Phos 39 - 117 IU/L 58 52 54  Bilirubin 0.0 - 1.2 mg/dL <0.2 <0.2 0.2  Glucose 65 - 99 mg/dL 84 93 99  Cholesterol - (None) (None) (None)  HDL cholesterol  >39 mg/dL (None) (None) 90  Triglycerides 0 - 149 mg/dL (None) (None) 119  LDL Direct - (None) (None) (None)  LDL Calc 0 - 99 mg/dL (None) (None) 140(H)  Total protein - (None) (None) (None)  Albumin 3.5 - 4.8 g/dL 4.6 4.3 4.5   Lab Results  Component Value Date   TSH 1.730 05/13/2015   Lab Results  Component Value Date   BUN 16 05/13/2015   Dg Chest 2 View  05/15/2015  CLINICAL DATA:  Cough for several weeks, weight loss EXAM: CHEST  2 VIEW COMPARISON:  CT chest of 02/07/2014 and chest x-ray of 01/03/2014 FINDINGS: The lungs  remain clear and somewhat hyper aerated. Mediastinal and hilar contours are unremarkable. The heart is within normal limits in size. No bony abnormality is seen. IMPRESSION: No active cardiopulmonary disease. Electronically Signed   By: Ivar Drape M.D.   On: 05/15/2015 10:17   Assessment/Plan  1. Cough Persistent mainly dry cough. Does not interrupt sleep. Does not seem associated with meals or trying to swallow liquids. Will treat symptomatically at this time. Remain alert to final reports of the [employer] testing.  2. Loss of weight Patient's weight has been slightly improving since her last visit. She says her appetite is good. Continue to monitor.

## 2015-06-30 LAB — AFB CULTURE WITH SMEAR (NOT AT ARMC)
ACID FAST CULTURE: NEGATIVE
Acid Fast Smear: NEGATIVE

## 2015-06-30 LAB — TB SKIN TEST
Induration: 0 mm
TB Skin Test: NEGATIVE

## 2015-07-02 ENCOUNTER — Other Ambulatory Visit: Payer: Self-pay | Admitting: Internal Medicine

## 2015-07-17 DIAGNOSIS — C44319 Basal cell carcinoma of skin of other parts of face: Secondary | ICD-10-CM | POA: Diagnosis not present

## 2015-07-17 DIAGNOSIS — Z85828 Personal history of other malignant neoplasm of skin: Secondary | ICD-10-CM | POA: Diagnosis not present

## 2015-07-17 DIAGNOSIS — L089 Local infection of the skin and subcutaneous tissue, unspecified: Secondary | ICD-10-CM | POA: Diagnosis not present

## 2015-10-06 DIAGNOSIS — W19XXXA Unspecified fall, initial encounter: Secondary | ICD-10-CM | POA: Diagnosis not present

## 2015-10-06 DIAGNOSIS — S39012A Strain of muscle, fascia and tendon of lower back, initial encounter: Secondary | ICD-10-CM | POA: Diagnosis not present

## 2015-10-07 ENCOUNTER — Other Ambulatory Visit: Payer: Self-pay | Admitting: Internal Medicine

## 2015-10-21 ENCOUNTER — Ambulatory Visit (INDEPENDENT_AMBULATORY_CARE_PROVIDER_SITE_OTHER): Payer: Medicare Other | Admitting: Internal Medicine

## 2015-10-21 VITALS — BP 140/72 | HR 77 | Temp 98.2°F | Resp 20 | Ht 61.0 in | Wt 105.0 lb

## 2015-10-21 DIAGNOSIS — R634 Abnormal weight loss: Secondary | ICD-10-CM | POA: Diagnosis not present

## 2015-10-21 DIAGNOSIS — K2100 Gastro-esophageal reflux disease with esophagitis, without bleeding: Secondary | ICD-10-CM

## 2015-10-21 DIAGNOSIS — N952 Postmenopausal atrophic vaginitis: Secondary | ICD-10-CM

## 2015-10-21 DIAGNOSIS — E785 Hyperlipidemia, unspecified: Secondary | ICD-10-CM | POA: Diagnosis not present

## 2015-10-21 DIAGNOSIS — I10 Essential (primary) hypertension: Secondary | ICD-10-CM

## 2015-10-21 DIAGNOSIS — F329 Major depressive disorder, single episode, unspecified: Secondary | ICD-10-CM

## 2015-10-21 DIAGNOSIS — F32A Depression, unspecified: Secondary | ICD-10-CM

## 2015-10-21 DIAGNOSIS — M545 Low back pain, unspecified: Secondary | ICD-10-CM | POA: Insufficient documentation

## 2015-10-21 MED ORDER — NIZATIDINE 150 MG PO CAPS: 150.0000 mg | ORAL_CAPSULE | Freq: Two times a day (BID) | ORAL | Status: DC | PRN

## 2015-10-21 MED ORDER — MIRTAZAPINE 15 MG PO TABS
15.0000 mg | ORAL_TABLET | Freq: Every evening | ORAL | Status: DC | PRN
Start: 2015-10-21 — End: 2015-10-21

## 2015-10-21 MED ORDER — NIZATIDINE 150 MG PO CAPS: ORAL_CAPSULE | ORAL | Status: DC

## 2015-10-21 MED ORDER — MIRTAZAPINE 15 MG PO TABS: 15.0000 mg | ORAL_TABLET | Freq: Every evening | ORAL | Status: DC | PRN

## 2015-10-21 MED ORDER — PRAVASTATIN SODIUM 40 MG PO TABS: ORAL_TABLET | ORAL | Status: DC

## 2015-10-21 NOTE — Progress Notes (Signed)
Patient ID: Caroline Snyder, female   DOB: Jan 21, 1941, 76 y.o.   MRN: [id number]   Location:  [facility]  Provider: Jeanmarie Hubert, M.D.  Code Status: Full code Goals of Care:  Advanced Directives 06/24/2015  Does patient have an advance directive? No  Would patient like information on creating an advanced directive? Yes - Educational materials given  Pre-existing out of facility DNR order (yellow form or pink MOST form) -     Chief Complaint  Patient presents with  . Medical Management of Chronic Issues    stomach issues,    HPI: Patient is a 75 y.o. female seen today for medical management of chronic diseases.    Feeling tight in the lower back and into the buttocks about 1-2 months. Also fell with her patient 3 weeks ago and things got worse. Went to an Urgent Care. Was given some exercises. She has done the exercises most nights. Left leg would catch. Seems better from that. Continues to do the free style shag and 2- step dancing. Some clogging.  Using Extra strength Tylenol irregularly.  Loose stools for the last couple of months. Urgency of stooling when she gets the urge. No blood. Watery yellow stools.No fever. No nocturnal stools. Last colonoscopy 2013 showed some sessile.polyps. Done by Dr. Deatra Ina. No upper GI symptoms.  Essential hypertensionControlled -   Gastroesophageal reflux disease with esophagitis - asymptomatic on nizatidine.   Vaginal atrophy - responding well to estrogen cream   Hyperlipidemia - no recent lab      Depression - now on mirtazapine   Loss of weight - Regained nearly 5 pounds. She says she now has a "muffin top".   Past Medical History  Diagnosis Date  . Allergy   . Depression 05/05/2007  . Hyperlipidemia 06/29/2006  . 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 Bunion 07/01/2010  . Unspecified essential hypertension 11/22/2007  . Osteoporosis, unspecified 04/15/2004  . Memory loss 04/15/2003  . Migraine without aura, without mention of  intractable migraine without mention of status migrainosus 03/24/1999  . 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 External hemorrhoids without mention of complication [id number]  . GERD (gastroesophageal reflux disease) 2000    Past Surgical History  Procedure Laterality Date  . Vaginal hysterectomy  1980  . Eye surgery Bilateral 2010    cataract Dr. Bing Plume  . Colonoscopy  06-16-2007    Internal hemorrhoids,laxity of anal sphincter,polyp at 25cm form anal verge, tortuous sigmoid colon. Dr.Orr     Allergies  Allergen Reactions  . Vioxx [Rofecoxib] Nausea Only    Pt. Can't remember what reaction was but asks to leave on her profile.       Medication List       This list is accurate as of: 10/21/15  4:42 PM.  Always use your most recent med list.               acyclovir 200 MG capsule  Commonly known as:  ZOVIRAX  One daily as needed to prevent vaginal discomfort related to viral infection.     aspirin 81 MG tablet  Take 81 mg by mouth daily. Take 1 tablet daily to prevent heart attack, stroke and clotting.     Cranberry 250 MG Tabs  Take by mouth. Take 1 tablet daily     estradiol 0.1 MG/GM vaginal cream  Commonly known as:  ESTRACE  Place [id number] Applicatorfuls vaginally 2 (two) times a week.     HM VITAMIN D3 2000 units Caps  Generic drug:  Cholecalciferol  Take by mouth. Take 1 tablet daily     ICAPS Tabs  Take 1 tablet by mouth 2 (two) times daily.     mirtazapine 15 MG tablet  Commonly known as:  REMERON  Take 1 tablet (15 mg total) by mouth at bedtime as needed. For sleep     MULTI VITAMIN DAILY PO  Take by mouth. Centrum Silver for Women Take 1 tablet daily     nizatidine 150 MG capsule  Commonly known as:  AXID  Take 1 capsule (150 mg total) by mouth 2 (two) times daily as needed. Take one tablet twice daily as needed to help stomach.     pravastatin 40 MG tablet  Commonly known as:  PRAVACHOL  TAKE 1 TABLET BY MOUTH DAILY     RESTASIS  0.05 % ophthalmic emulsion  Generic drug:  cycloSPORINE  1 drop 2 (two) times daily.        Review of Systems:  Review of Systems  Constitutional: Positive for unexpected weight change (Weight loss is improving now). Negative for fatigue.  HENT: Negative.   Eyes: Negative.   Respiratory: Positive for cough.   Cardiovascular: Positive for palpitations.  Gastrointestinal: Positive for diarrhea.  Genitourinary:       History of stress incontinence. Some increase in frequency. Denies nocturia or dysuria. Complains of pain on intercourse.  Musculoskeletal: Negative.   Skin: Negative.   Neurological:       Patient has noted an area of altered sensation of the right anterior thigh just below a cluster of superficial varicose veins. It is not painful.  Hematological: Negative.   Psychiatric/Behavioral:       Some sadness.    Health Maintenance  Topic Date Due  . PNA vac Low Risk Adult (1 of 2 - PCV13) 11/08/2004  . TETANUS/TDAP  12/25/2015  . INFLUENZA VACCINE  03/02/2016  . MAMMOGRAM  05/29/2016  . COLONOSCOPY  02/10/2017  . DEXA SCAN  Completed  . ZOSTAVAX  Completed    Physical Exam: Filed Vitals:   10/21/15 1613  BP: 140/72  Pulse: 77  Temp: 98.2 F (36.8 C)  TempSrc: Oral  Resp: 20  Height: 5\' 1"  (1.549 m)  Weight: 105 lb (47.628 kg)  SpO2: 97%   Body mass index is 19.85 kg/(m^2). Physical Exam  Constitutional: She is oriented to person, place, and time. She appears well-developed and well-nourished. No distress.  HENT:  Right Ear: External ear normal.  Left Ear: External ear normal.  Nose: Nose normal.  Mouth/Throat: Oropharynx is clear and moist.  Eyes: Conjunctivae are normal. Pupils are equal, round, and reactive to light. Right eye exhibits no discharge. Left eye exhibits no discharge. No scleral icterus.  Corrective lenses  Neck: Normal range of motion. Neck supple. No JVD present. No tracheal deviation present. No thyromegaly present.  Cardiovascular:  Normal rate, regular rhythm, normal heart sounds and intact distal pulses.  Exam reveals no gallop and no friction rub.   No murmur heard. Pulmonary/Chest: Effort normal. No respiratory distress. She has no wheezes. She has no rales. She exhibits no tenderness.  Persistent coughing.  Abdominal: Soft. Bowel sounds are normal. She exhibits no distension and no mass. There is no tenderness. There is no rebound.  Musculoskeletal: Normal range of motion. She exhibits no edema or tenderness.  Bilateral bunions  Lymphadenopathy:    She has no cervical adenopathy.  Neurological: She is alert and oriented to person, place, and time. No cranial nerve  deficit. Coordination normal.  Skin: No rash noted. No erythema. No pallor.  Right leg anteriorly has a cluster of superficial varicose veins.  Psychiatric: She has a normal mood and affect. Judgment and thought content normal.    Labs reviewed: Basic Metabolic Panel:  Recent Labs  05/13/15 1702  NA 145*  K 4.2  CL 103  CO2 29  GLUCOSE 84  BUN 16  CREATININE 0.77  CALCIUM 10.1  TSH 1.730   Liver Function Tests:  Recent Labs  05/13/15 1702  AST 22  ALT 19  ALKPHOS 58  BILITOT <0.2  PROT 7.0  ALBUMIN 4.6   No results for input(s): LIPASE, AMYLASE in the last 8760 hours. No results for input(s): AMMONIA in the last 8760 hours. CBC:  Recent Labs  05/13/15 1702  WBC 6.3  NEUTROABS 4.2  HCT 38.4  MCV 91    Assessment/Plan  1. Essential hypertension Controlled  2. Gastroesophageal reflux disease with esophagitis - nizatidine (AXID) 150 MG capsule; Take one tablet twice daily as needed to reduce stomach acid  Dispense: 180 capsule; Refill: 4  3. Vaginal atrophy  Renew estradiol as needed  4. Hyperlipidemia - pravastatin (PRAVACHOL) 40 MG tablet; Ne each night to lower cholesterol  Dispense: 90 tablet; Refill: 4  5. Midline low back pain without sciatica, unspecified chronicity -Do Williams flexion exercises at least  once daily with 10 repetitions on each exercise. Reprinted of instructions was given the patient.  6. Depression - mirtazapine (REMERON) 15 MG tablet; Take 1 tablet (15 mg total) by mouth at bedtime as needed. For sleep  Dispense: 30 tablet; Refill: 2  7. Loss of weight - mirtazapine (REMERON) 15 MG tablet; Take 1 tablet (15 mg total) by mouth at bedtime as needed. For sleep  Dispense: 30 tablet; Refill: 2

## 2015-10-30 DIAGNOSIS — H5213 Myopia, bilateral: Secondary | ICD-10-CM | POA: Diagnosis not present

## 2015-10-30 DIAGNOSIS — H04123 Dry eye syndrome of bilateral lacrimal glands: Secondary | ICD-10-CM | POA: Diagnosis not present

## 2015-10-30 DIAGNOSIS — H353131 Nonexudative age-related macular degeneration, bilateral, early dry stage: Secondary | ICD-10-CM | POA: Diagnosis not present

## 2015-10-30 DIAGNOSIS — H524 Presbyopia: Secondary | ICD-10-CM | POA: Diagnosis not present

## 2015-11-13 DIAGNOSIS — L821 Other seborrheic keratosis: Secondary | ICD-10-CM | POA: Diagnosis not present

## 2015-11-13 DIAGNOSIS — Z85828 Personal history of other malignant neoplasm of skin: Secondary | ICD-10-CM | POA: Diagnosis not present

## 2015-11-13 DIAGNOSIS — D485 Neoplasm of uncertain behavior of skin: Secondary | ICD-10-CM | POA: Diagnosis not present

## 2015-12-18 ENCOUNTER — Encounter: Payer: Self-pay | Admitting: Gynecology

## 2015-12-18 ENCOUNTER — Ambulatory Visit (INDEPENDENT_AMBULATORY_CARE_PROVIDER_SITE_OTHER): Payer: Medicare Other | Admitting: Gynecology

## 2015-12-18 VITALS — BP 138/80 | Ht 60.0 in | Wt 104.0 lb

## 2015-12-18 DIAGNOSIS — M8580 Other specified disorders of bone density and structure, unspecified site: Secondary | ICD-10-CM

## 2015-12-18 DIAGNOSIS — Z01419 Encounter for gynecological examination (general) (routine) without abnormal findings: Secondary | ICD-10-CM | POA: Diagnosis not present

## 2015-12-18 NOTE — Progress Notes (Signed)
Caroline Snyder April 22, 1941 [id number]   History:    75 y.o.  for annual gyn exam with no complaints today.Review of patient's records indicated that she has had past history of osteoporosis and had been on Fosamax 70 mg weekly from 2005-2010. She is on a drug holiday. 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 Her last bone density study was in 2015 the lowest T score was at the right femoral neck -2.0. Patient taking her calcium and vitamin D.Patient with past history of abdominal hysterectomy with bilateral salpingo-oophorectomy for benign indications. Patient with no prior history of abnormal Pap smear. Patient's PCP is Dr. Nyoka Cowden who has been doing her blood work. Patient's last colonoscopy was benign in 2013.  Past medical history,surgical history, family history and social history were all reviewed and documented in the EPIC chart.  Gynecologic History No LMP recorded. Patient has had a hysterectomy. Contraception: post menopausal status Last Pap: 2011. Results were: normal Last mammogram: 2016. Results were: normal  Obstetric History OB History  Gravida Para Term Preterm AB SAB TAB Ectopic Multiple Living  2 2        2     # Outcome Date GA Lbr Len/2nd Weight Sex Delivery Anes PTL Lv  2 Para     M Vag-Spont   Y  1 Para     M Vag-Spont   Y       ROS: A ROS was performed and pertinent positives and negatives are included in the history.  GENERAL: No fevers or chills. HEENT: No change in vision, no earache, sore throat or sinus congestion. NECK: No pain or stiffness. CARDIOVASCULAR: No chest pain or pressure. No palpitations. PULMONARY: No shortness of breath, cough or wheeze. GASTROINTESTINAL: No abdominal pain, nausea, vomiting or diarrhea, melena or bright red blood per rectum. GENITOURINARY: No urinary frequency, urgency, hesitancy or dysuria.  MUSCULOSKELETAL: No joint or muscle pain, no back pain, no recent trauma. DERMATOLOGIC: No rash, no itching, no lesions. ENDOCRINE: No polyuria, polydipsia, no heat or cold intolerance. No recent change in weight. HEMATOLOGICAL: No anemia or easy bruising or bleeding. NEUROLOGIC: No headache, seizures, numbness, tingling or weakness. PSYCHIATRIC: No depression, no loss of interest in normal activity or change in sleep pattern.     Exam: chaperone present  BP 138/80 mmHg  Ht 5' (1.524 m)  Wt 104 lb (47.174 kg)  BMI 20.31 kg/m2  Body mass index is 20.31 kg/(m^2).  General appearance : Well developed well nourished female. No acute distress HEENT: Eyes: no retinal hemorrhage or exudates,  Neck supple, trachea midline, no carotid bruits, no thyroidmegaly Lungs: Clear to auscultation, no rhonchi or wheezes, or rib retractions  Heart: Regular rate and rhythm, no murmurs or gallops Breast:Examined in sitting and supine position were symmetrical in appearance, no palpable masses or tenderness,  no skin retraction, no nipple inversion, no nipple discharge, no skin discoloration, no axillary or supraclavicular lymphadenopathy Abdomen: no palpable masses or tenderness, no rebound or guarding Extremities: no edema or skin discoloration or tenderness  Pelvic:  Bartholin, Urethra, Skene Glands: Within normal limits             Vagina: No gross lesions or discharge, atrophic changes  Cervix: Absent  Uterus absent  Adnexa  Without masses or tenderness  Anus and perineum  normal   Rectovaginal  normal sphincter tone without  palpated masses or tenderness             Hemoccult PCP provides     Assessment/Plan:  75 y.o. female for annual exam will be scheduling to do her bone density study this year. We discussed importance of calcium vitamin D and weightbearing exercises for osteoporosis prevention. Her PCP has been doing her blood work. We discussed importance of calcium vitamin D and weightbearing  exercises for osteoporosis prevention. Patient with history of osteopenia.   Terrance Mass MD, 3:09 PM 12/18/2015

## 2015-12-18 NOTE — Patient Instructions (Signed)

## 2016-01-05 ENCOUNTER — Other Ambulatory Visit: Payer: Self-pay | Admitting: Internal Medicine

## 2016-01-22 ENCOUNTER — Other Ambulatory Visit: Payer: Self-pay | Admitting: Gynecology

## 2016-01-22 ENCOUNTER — Ambulatory Visit (INDEPENDENT_AMBULATORY_CARE_PROVIDER_SITE_OTHER): Payer: Medicare Other

## 2016-01-22 DIAGNOSIS — M810 Age-related osteoporosis without current pathological fracture: Secondary | ICD-10-CM

## 2016-01-22 DIAGNOSIS — M8580 Other specified disorders of bone density and structure, unspecified site: Secondary | ICD-10-CM

## 2016-02-17 ENCOUNTER — Encounter: Payer: Self-pay | Admitting: Internal Medicine

## 2016-02-17 ENCOUNTER — Ambulatory Visit (INDEPENDENT_AMBULATORY_CARE_PROVIDER_SITE_OTHER): Payer: Medicare Other | Admitting: Internal Medicine

## 2016-02-17 VITALS — BP 142/80 | HR 73 | Temp 97.8°F | Ht 60.0 in | Wt 103.0 lb

## 2016-02-17 DIAGNOSIS — R05 Cough: Secondary | ICD-10-CM | POA: Diagnosis not present

## 2016-02-17 DIAGNOSIS — I10 Essential (primary) hypertension: Secondary | ICD-10-CM

## 2016-02-17 DIAGNOSIS — R634 Abnormal weight loss: Secondary | ICD-10-CM | POA: Diagnosis not present

## 2016-02-17 DIAGNOSIS — M810 Age-related osteoporosis without current pathological fracture: Secondary | ICD-10-CM | POA: Diagnosis not present

## 2016-02-17 DIAGNOSIS — R059 Cough, unspecified: Secondary | ICD-10-CM

## 2016-02-17 NOTE — Progress Notes (Signed)
Patient ID: Caroline Snyder, female   DOB: Nov 18, 1940, 75 y.o.   MRN: [id number]    Facility  [facility]    Place of Service:   OFFICE    Allergies  Allergen Reactions  . Vioxx [Rofecoxib] Nausea Only    Pt. Can't remember what reaction was but asks to leave on her profile.     Chief Complaint  Patient presents with  . Medical Management of Chronic Issues    4 month medication management blood pressure, GERD, cholesterol, depression.  . Back Pain    about 2 months ago was trying to keep someone to fall and went down with her. Went to Urgent Care at the time. Now c/o with low back and upper legs pain.   Marland Kitchen Cough    for a month, coughs up thick Gaytha Raybourn mucus occasionally, for about a week blood looking.     HPI:  Cough with Shawnta Zimbelman mucus in sputum and some blood in the sputum initially. Sputum is thick. No fever. Some chest discomfort. Started to improve about 3 weeks ago. Feels like she is getting better.  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 Went to [facility] on [address]. Did not get x-rays then. Some mornings she feels like she cannot move. Eventually things begin to loosen up. Sometimes has a pain that goes down the leg. Left side may be worse than the right.  She used Aleve, but it upset her stomach so she switched to Tylenol which seems to be adequate.  Dr. Toney Rakes did Bone Density which seemed positive for osteoporosis. He would like PTH. Vitamin D, and calcium level. Calcium in Oct 2016 was normal at 10.1.  Medications: Patient's Medications  New Prescriptions   No medications on file  Previous Medications   ACYCLOVIR (ZOVIRAX) 200 MG CAPSULE    One daily as needed to prevent vaginal discomfort related to viral infection.   ASPIRIN 81 MG TABLET    Take 81 mg by mouth daily. Take 1 tablet daily to prevent heart attack, stroke and clotting.   CHOLECALCIFEROL (HM VITAMIN D3) 2000 UNITS CAPS    Take by mouth. Take 1 tablet daily   CRANBERRY 250  MG TABS    Take by mouth. Take 1 tablet daily   CYCLOSPORINE (RESTASIS) 0.05 % OPHTHALMIC EMULSION    1 drop 2 (two) times daily.   ESTRADIOL (ESTRACE) 0.1 MG/GM VAGINAL CREAM    Place 9.89 Applicatorfuls vaginally 2 (two) times a week.   MIRTAZAPINE (REMERON) 15 MG TABLET    Take 1 tablet (15 mg total) by mouth at bedtime as needed. For sleep   MULTIPLE VITAMIN (MULTI VITAMIN DAILY PO)    Take by mouth. Centrum Silver for Women Take 1 tablet daily   MULTIPLE VITAMINS-MINERALS (ICAPS) TABS    Take 1 tablet by mouth 2 (two) times daily.   NIZATIDINE (AXID) 150 MG CAPSULE    Take one tablet twice daily as needed to reduce stomach acid   PRAVASTATIN (PRAVACHOL) 40 MG TABLET    Ne each night to lower cholesterol  Modified Medications   No medications on file  Discontinued Medications   PRAVASTATIN (PRAVACHOL) 40 MG TABLET    TAKE 1 TABLET BY MOUTH DAILY    Review of Systems  Constitutional: Positive for unexpected weight change (Weight loss is improving now). Negative for fatigue.  HENT: Negative.   Eyes: Negative.   Respiratory: Positive for cough.   Cardiovascular: Positive for palpitations.  Gastrointestinal: Positive for diarrhea.  Genitourinary:       History of stress incontinence. Some increase in frequency. Denies nocturia or dysuria. Complains of pain on intercourse.  Musculoskeletal: Negative.   Skin: Negative.   Neurological:       Patient has noted an area of altered sensation of the right anterior thigh just below a cluster of superficial varicose veins. It is not painful.  Hematological: Negative.   Psychiatric/Behavioral:       Some sadness.    Filed Vitals:   02/17/16 1637  BP: 142/80  Pulse: 73  Temp: 97.8 F (36.6 C)  TempSrc: Oral  Height: 5' (1.524 m)  Weight: 103 lb (46.72 kg)  SpO2: 95%   Body mass index is 20.12 kg/(m^2). Wt Readings from Last 3 Encounters:  02/17/16 103 lb (46.72 kg)  12/18/15 104 lb (47.174 kg)  10/21/15 105 lb (47.628 kg)       Physical Exam  Constitutional: She is oriented to person, place, and time. She appears well-developed and well-nourished. No distress.  HENT:  Right Ear: External ear normal.  Left Ear: External ear normal.  Nose: Nose normal.  Mouth/Throat: Oropharynx is clear and moist.  Eyes: Conjunctivae are normal. Pupils are equal, round, and reactive to light. Right eye exhibits no discharge. Left eye exhibits no discharge. No scleral icterus.  Corrective lenses  Neck: Normal range of motion. Neck supple. No JVD present. No tracheal deviation present. No thyromegaly present.  Cardiovascular: Normal rate, regular rhythm, normal heart sounds and intact distal pulses.  Exam reveals no gallop and no friction rub.   No murmur heard. Pulmonary/Chest: Effort normal. No respiratory distress. She has no wheezes. She has no rales. She exhibits no tenderness.  Persistent coughing.  Abdominal: Soft. Bowel sounds are normal. She exhibits no distension and no mass. There is no tenderness. There is no rebound.  Musculoskeletal: Normal range of motion. She exhibits no edema or tenderness.  Bilateral bunions  Lymphadenopathy:    She has no cervical adenopathy.  Neurological: She is alert and oriented to person, place, and time. No cranial nerve deficit. Coordination normal.  Skin: No rash noted. No erythema. No pallor.  Right leg anteriorly has a cluster of superficial varicose veins.  Psychiatric: She has a normal mood and affect. Judgment and thought content normal.    Labs reviewed: Lab Summary Latest Ref Rng 05/13/2015 09/13/2014 03/11/2014  Hemoglobin 11.1 - 15.9 g/dL 13.0 (None) (None)  Hematocrit 34.0 - 46.6 % 38.4 (None) (None)  White count 3.4 - 10.8 x10E3/uL 6.3 (None) (None)  Platelet count - (None) (None) (None)  Sodium 134 - 144 mmol/L 145(H) 140 142  Potassium 3.5 - 5.2 mmol/L 4.2 4.8 4.4  Calcium 8.7 - 10.3 mg/dL 10.1 9.9 9.7  Phosphorus - (None) (None) (None)  Creatinine 0.57 - 1.00  mg/dL 0.77 0.88 0.81  AST 0 - 40 IU/L '22 20 21  '$ Alk Phos 39 - 117 IU/L 58 52 54  Bilirubin 0.0 - 1.2 mg/dL <0.2 <0.2 0.2  Glucose 65 - 99 mg/dL 84 93 99  Cholesterol - (None) (None) (None)  HDL cholesterol >39 mg/dL (None) (None) 90  Triglycerides 0 - 149 mg/dL (None) (None) 119  LDL Direct - (None) (None) (None)  LDL Calc 0 - 99 mg/dL (None) (None) 140(H)  Total protein - (None) (None) (None)  Albumin 3.5 - 4.8 g/dL 4.6 4.3 4.5   Lab Results  Component Value Date   TSH 1.730 05/13/2015   TSH 3.250 08/30/2013  Lab Results  Component Value Date   BUN 16 05/13/2015   BUN 17 09/13/2014   BUN 14 03/11/2014   No results found for: HGBA1C  Assessment/Plan

## 2016-02-18 ENCOUNTER — Other Ambulatory Visit: Payer: Medicare Other

## 2016-02-18 ENCOUNTER — Other Ambulatory Visit: Payer: Self-pay | Admitting: *Deleted

## 2016-02-18 DIAGNOSIS — M810 Age-related osteoporosis without current pathological fracture: Secondary | ICD-10-CM

## 2016-02-19 DIAGNOSIS — M461 Sacroiliitis, not elsewhere classified: Secondary | ICD-10-CM | POA: Diagnosis not present

## 2016-02-19 DIAGNOSIS — M5137 Other intervertebral disc degeneration, lumbosacral region: Secondary | ICD-10-CM | POA: Diagnosis not present

## 2016-02-19 DIAGNOSIS — M25552 Pain in left hip: Secondary | ICD-10-CM | POA: Diagnosis not present

## 2016-02-19 DIAGNOSIS — M791 Myalgia: Secondary | ICD-10-CM | POA: Diagnosis not present

## 2016-02-19 DIAGNOSIS — M545 Low back pain: Secondary | ICD-10-CM | POA: Diagnosis not present

## 2016-02-19 DIAGNOSIS — M25551 Pain in right hip: Secondary | ICD-10-CM | POA: Diagnosis not present

## 2016-02-19 DIAGNOSIS — M5127 Other intervertebral disc displacement, lumbosacral region: Secondary | ICD-10-CM | POA: Diagnosis not present

## 2016-02-19 LAB — PTH, INTACT AND CALCIUM
CALCIUM: 8.8 mg/dL (ref 8.4–10.5)
PTH: 42 pg/mL (ref 14–64)

## 2016-02-21 LAB — VITAMIN D 1,25 DIHYDROXY
Vitamin D 1, 25 (OH)2 Total: 62 pg/mL (ref 18–72)
Vitamin D2 1, 25 (OH)2: <8 pg/mL
Vitamin D3 1, 25 (OH)2: 62 pg/mL

## 2016-03-02 DIAGNOSIS — M25551 Pain in right hip: Secondary | ICD-10-CM | POA: Diagnosis not present

## 2016-03-02 DIAGNOSIS — M6240 Contracture of muscle, unspecified site: Secondary | ICD-10-CM | POA: Diagnosis not present

## 2016-03-02 DIAGNOSIS — M25552 Pain in left hip: Secondary | ICD-10-CM | POA: Diagnosis not present

## 2016-03-02 DIAGNOSIS — R293 Abnormal posture: Secondary | ICD-10-CM | POA: Diagnosis not present

## 2016-03-02 DIAGNOSIS — M5137 Other intervertebral disc degeneration, lumbosacral region: Secondary | ICD-10-CM | POA: Diagnosis not present

## 2016-03-02 DIAGNOSIS — M9903 Segmental and somatic dysfunction of lumbar region: Secondary | ICD-10-CM | POA: Diagnosis not present

## 2016-03-02 DIAGNOSIS — M791 Myalgia: Secondary | ICD-10-CM | POA: Diagnosis not present

## 2016-03-02 DIAGNOSIS — M461 Sacroiliitis, not elsewhere classified: Secondary | ICD-10-CM | POA: Diagnosis not present

## 2016-03-02 DIAGNOSIS — M5127 Other intervertebral disc displacement, lumbosacral region: Secondary | ICD-10-CM | POA: Diagnosis not present

## 2016-03-02 DIAGNOSIS — M545 Low back pain: Secondary | ICD-10-CM | POA: Diagnosis not present

## 2016-03-04 DIAGNOSIS — R293 Abnormal posture: Secondary | ICD-10-CM | POA: Diagnosis not present

## 2016-03-04 DIAGNOSIS — M5137 Other intervertebral disc degeneration, lumbosacral region: Secondary | ICD-10-CM | POA: Diagnosis not present

## 2016-03-04 DIAGNOSIS — M5127 Other intervertebral disc displacement, lumbosacral region: Secondary | ICD-10-CM | POA: Diagnosis not present

## 2016-03-04 DIAGNOSIS — M461 Sacroiliitis, not elsewhere classified: Secondary | ICD-10-CM | POA: Diagnosis not present

## 2016-03-04 DIAGNOSIS — M9903 Segmental and somatic dysfunction of lumbar region: Secondary | ICD-10-CM | POA: Diagnosis not present

## 2016-03-04 DIAGNOSIS — M791 Myalgia: Secondary | ICD-10-CM | POA: Diagnosis not present

## 2016-03-04 DIAGNOSIS — M6240 Contracture of muscle, unspecified site: Secondary | ICD-10-CM | POA: Diagnosis not present

## 2016-03-11 DIAGNOSIS — M545 Low back pain: Secondary | ICD-10-CM | POA: Diagnosis not present

## 2016-03-11 DIAGNOSIS — R293 Abnormal posture: Secondary | ICD-10-CM | POA: Diagnosis not present

## 2016-03-11 DIAGNOSIS — M5137 Other intervertebral disc degeneration, lumbosacral region: Secondary | ICD-10-CM | POA: Diagnosis not present

## 2016-03-11 DIAGNOSIS — M791 Myalgia: Secondary | ICD-10-CM | POA: Diagnosis not present

## 2016-03-11 DIAGNOSIS — M5127 Other intervertebral disc displacement, lumbosacral region: Secondary | ICD-10-CM | POA: Diagnosis not present

## 2016-03-11 DIAGNOSIS — M6240 Contracture of muscle, unspecified site: Secondary | ICD-10-CM | POA: Diagnosis not present

## 2016-03-11 DIAGNOSIS — M461 Sacroiliitis, not elsewhere classified: Secondary | ICD-10-CM | POA: Diagnosis not present

## 2016-03-11 DIAGNOSIS — M9903 Segmental and somatic dysfunction of lumbar region: Secondary | ICD-10-CM | POA: Diagnosis not present

## 2016-03-12 DIAGNOSIS — M791 Myalgia: Secondary | ICD-10-CM | POA: Diagnosis not present

## 2016-03-12 DIAGNOSIS — M461 Sacroiliitis, not elsewhere classified: Secondary | ICD-10-CM | POA: Diagnosis not present

## 2016-03-12 DIAGNOSIS — M5127 Other intervertebral disc displacement, lumbosacral region: Secondary | ICD-10-CM | POA: Diagnosis not present

## 2016-03-12 DIAGNOSIS — M5137 Other intervertebral disc degeneration, lumbosacral region: Secondary | ICD-10-CM | POA: Diagnosis not present

## 2016-03-12 DIAGNOSIS — R293 Abnormal posture: Secondary | ICD-10-CM | POA: Diagnosis not present

## 2016-03-12 DIAGNOSIS — M9903 Segmental and somatic dysfunction of lumbar region: Secondary | ICD-10-CM | POA: Diagnosis not present

## 2016-03-12 DIAGNOSIS — M6240 Contracture of muscle, unspecified site: Secondary | ICD-10-CM | POA: Diagnosis not present

## 2016-03-16 DIAGNOSIS — M791 Myalgia: Secondary | ICD-10-CM | POA: Diagnosis not present

## 2016-03-16 DIAGNOSIS — M5137 Other intervertebral disc degeneration, lumbosacral region: Secondary | ICD-10-CM | POA: Diagnosis not present

## 2016-03-16 DIAGNOSIS — M9903 Segmental and somatic dysfunction of lumbar region: Secondary | ICD-10-CM | POA: Diagnosis not present

## 2016-03-16 DIAGNOSIS — M5127 Other intervertebral disc displacement, lumbosacral region: Secondary | ICD-10-CM | POA: Diagnosis not present

## 2016-03-16 DIAGNOSIS — M545 Low back pain: Secondary | ICD-10-CM | POA: Diagnosis not present

## 2016-03-16 DIAGNOSIS — R293 Abnormal posture: Secondary | ICD-10-CM | POA: Diagnosis not present

## 2016-03-16 DIAGNOSIS — M461 Sacroiliitis, not elsewhere classified: Secondary | ICD-10-CM | POA: Diagnosis not present

## 2016-03-16 DIAGNOSIS — M6240 Contracture of muscle, unspecified site: Secondary | ICD-10-CM | POA: Diagnosis not present

## 2016-03-19 DIAGNOSIS — M6240 Contracture of muscle, unspecified site: Secondary | ICD-10-CM | POA: Diagnosis not present

## 2016-03-19 DIAGNOSIS — M5137 Other intervertebral disc degeneration, lumbosacral region: Secondary | ICD-10-CM | POA: Diagnosis not present

## 2016-03-19 DIAGNOSIS — M5127 Other intervertebral disc displacement, lumbosacral region: Secondary | ICD-10-CM | POA: Diagnosis not present

## 2016-03-19 DIAGNOSIS — M791 Myalgia: Secondary | ICD-10-CM | POA: Diagnosis not present

## 2016-03-19 DIAGNOSIS — M9903 Segmental and somatic dysfunction of lumbar region: Secondary | ICD-10-CM | POA: Diagnosis not present

## 2016-03-19 DIAGNOSIS — M461 Sacroiliitis, not elsewhere classified: Secondary | ICD-10-CM | POA: Diagnosis not present

## 2016-03-19 DIAGNOSIS — R293 Abnormal posture: Secondary | ICD-10-CM | POA: Diagnosis not present

## 2016-03-22 ENCOUNTER — Encounter: Payer: Self-pay | Admitting: Internal Medicine

## 2016-03-23 DIAGNOSIS — M5137 Other intervertebral disc degeneration, lumbosacral region: Secondary | ICD-10-CM | POA: Diagnosis not present

## 2016-03-23 DIAGNOSIS — R293 Abnormal posture: Secondary | ICD-10-CM | POA: Diagnosis not present

## 2016-03-23 DIAGNOSIS — M461 Sacroiliitis, not elsewhere classified: Secondary | ICD-10-CM | POA: Diagnosis not present

## 2016-03-23 DIAGNOSIS — M545 Low back pain: Secondary | ICD-10-CM | POA: Diagnosis not present

## 2016-03-23 DIAGNOSIS — M9903 Segmental and somatic dysfunction of lumbar region: Secondary | ICD-10-CM | POA: Diagnosis not present

## 2016-03-23 DIAGNOSIS — M5127 Other intervertebral disc displacement, lumbosacral region: Secondary | ICD-10-CM | POA: Diagnosis not present

## 2016-03-23 DIAGNOSIS — M6240 Contracture of muscle, unspecified site: Secondary | ICD-10-CM | POA: Diagnosis not present

## 2016-03-23 DIAGNOSIS — M791 Myalgia: Secondary | ICD-10-CM | POA: Diagnosis not present

## 2016-03-25 DIAGNOSIS — M5137 Other intervertebral disc degeneration, lumbosacral region: Secondary | ICD-10-CM | POA: Diagnosis not present

## 2016-03-25 DIAGNOSIS — M791 Myalgia: Secondary | ICD-10-CM | POA: Diagnosis not present

## 2016-03-25 DIAGNOSIS — M5127 Other intervertebral disc displacement, lumbosacral region: Secondary | ICD-10-CM | POA: Diagnosis not present

## 2016-03-25 DIAGNOSIS — M461 Sacroiliitis, not elsewhere classified: Secondary | ICD-10-CM | POA: Diagnosis not present

## 2016-04-06 ENCOUNTER — Other Ambulatory Visit: Payer: Self-pay | Admitting: Internal Medicine

## 2016-04-08 DIAGNOSIS — M5127 Other intervertebral disc displacement, lumbosacral region: Secondary | ICD-10-CM | POA: Diagnosis not present

## 2016-04-08 DIAGNOSIS — R293 Abnormal posture: Secondary | ICD-10-CM | POA: Diagnosis not present

## 2016-04-08 DIAGNOSIS — M9903 Segmental and somatic dysfunction of lumbar region: Secondary | ICD-10-CM | POA: Diagnosis not present

## 2016-04-08 DIAGNOSIS — M5137 Other intervertebral disc degeneration, lumbosacral region: Secondary | ICD-10-CM | POA: Diagnosis not present

## 2016-04-08 DIAGNOSIS — M461 Sacroiliitis, not elsewhere classified: Secondary | ICD-10-CM | POA: Diagnosis not present

## 2016-04-08 DIAGNOSIS — M6240 Contracture of muscle, unspecified site: Secondary | ICD-10-CM | POA: Diagnosis not present

## 2016-04-08 DIAGNOSIS — M791 Myalgia: Secondary | ICD-10-CM | POA: Diagnosis not present

## 2016-04-09 DIAGNOSIS — R293 Abnormal posture: Secondary | ICD-10-CM | POA: Diagnosis not present

## 2016-04-09 DIAGNOSIS — M791 Myalgia: Secondary | ICD-10-CM | POA: Diagnosis not present

## 2016-04-09 DIAGNOSIS — M461 Sacroiliitis, not elsewhere classified: Secondary | ICD-10-CM | POA: Diagnosis not present

## 2016-04-09 DIAGNOSIS — M5127 Other intervertebral disc displacement, lumbosacral region: Secondary | ICD-10-CM | POA: Diagnosis not present

## 2016-04-09 DIAGNOSIS — M6240 Contracture of muscle, unspecified site: Secondary | ICD-10-CM | POA: Diagnosis not present

## 2016-04-09 DIAGNOSIS — M5137 Other intervertebral disc degeneration, lumbosacral region: Secondary | ICD-10-CM | POA: Diagnosis not present

## 2016-04-09 DIAGNOSIS — M9903 Segmental and somatic dysfunction of lumbar region: Secondary | ICD-10-CM | POA: Diagnosis not present

## 2016-04-14 DIAGNOSIS — M5127 Other intervertebral disc displacement, lumbosacral region: Secondary | ICD-10-CM | POA: Diagnosis not present

## 2016-04-14 DIAGNOSIS — M791 Myalgia: Secondary | ICD-10-CM | POA: Diagnosis not present

## 2016-04-14 DIAGNOSIS — M461 Sacroiliitis, not elsewhere classified: Secondary | ICD-10-CM | POA: Diagnosis not present

## 2016-04-14 DIAGNOSIS — R293 Abnormal posture: Secondary | ICD-10-CM | POA: Diagnosis not present

## 2016-04-14 DIAGNOSIS — M5137 Other intervertebral disc degeneration, lumbosacral region: Secondary | ICD-10-CM | POA: Diagnosis not present

## 2016-04-14 DIAGNOSIS — M9903 Segmental and somatic dysfunction of lumbar region: Secondary | ICD-10-CM | POA: Diagnosis not present

## 2016-04-14 DIAGNOSIS — M6240 Contracture of muscle, unspecified site: Secondary | ICD-10-CM | POA: Diagnosis not present

## 2016-04-15 DIAGNOSIS — R293 Abnormal posture: Secondary | ICD-10-CM | POA: Diagnosis not present

## 2016-04-15 DIAGNOSIS — M9903 Segmental and somatic dysfunction of lumbar region: Secondary | ICD-10-CM | POA: Diagnosis not present

## 2016-04-15 DIAGNOSIS — M461 Sacroiliitis, not elsewhere classified: Secondary | ICD-10-CM | POA: Diagnosis not present

## 2016-04-15 DIAGNOSIS — M5137 Other intervertebral disc degeneration, lumbosacral region: Secondary | ICD-10-CM | POA: Diagnosis not present

## 2016-04-15 DIAGNOSIS — M5127 Other intervertebral disc displacement, lumbosacral region: Secondary | ICD-10-CM | POA: Diagnosis not present

## 2016-04-15 DIAGNOSIS — M6240 Contracture of muscle, unspecified site: Secondary | ICD-10-CM | POA: Diagnosis not present

## 2016-04-15 DIAGNOSIS — M791 Myalgia: Secondary | ICD-10-CM | POA: Diagnosis not present

## 2016-04-20 DIAGNOSIS — M791 Myalgia: Secondary | ICD-10-CM | POA: Diagnosis not present

## 2016-04-20 DIAGNOSIS — R293 Abnormal posture: Secondary | ICD-10-CM | POA: Diagnosis not present

## 2016-04-20 DIAGNOSIS — M461 Sacroiliitis, not elsewhere classified: Secondary | ICD-10-CM | POA: Diagnosis not present

## 2016-04-20 DIAGNOSIS — M5137 Other intervertebral disc degeneration, lumbosacral region: Secondary | ICD-10-CM | POA: Diagnosis not present

## 2016-04-20 DIAGNOSIS — M4726 Other spondylosis with radiculopathy, lumbar region: Secondary | ICD-10-CM | POA: Diagnosis not present

## 2016-04-20 DIAGNOSIS — M5127 Other intervertebral disc displacement, lumbosacral region: Secondary | ICD-10-CM | POA: Diagnosis not present

## 2016-04-20 DIAGNOSIS — M6240 Contracture of muscle, unspecified site: Secondary | ICD-10-CM | POA: Diagnosis not present

## 2016-04-20 DIAGNOSIS — M9903 Segmental and somatic dysfunction of lumbar region: Secondary | ICD-10-CM | POA: Diagnosis not present

## 2016-04-20 DIAGNOSIS — M5126 Other intervertebral disc displacement, lumbar region: Secondary | ICD-10-CM | POA: Diagnosis not present

## 2016-04-22 DIAGNOSIS — M5127 Other intervertebral disc displacement, lumbosacral region: Secondary | ICD-10-CM | POA: Diagnosis not present

## 2016-04-22 DIAGNOSIS — M5137 Other intervertebral disc degeneration, lumbosacral region: Secondary | ICD-10-CM | POA: Diagnosis not present

## 2016-04-22 DIAGNOSIS — M6240 Contracture of muscle, unspecified site: Secondary | ICD-10-CM | POA: Diagnosis not present

## 2016-04-22 DIAGNOSIS — R293 Abnormal posture: Secondary | ICD-10-CM | POA: Diagnosis not present

## 2016-04-22 DIAGNOSIS — M791 Myalgia: Secondary | ICD-10-CM | POA: Diagnosis not present

## 2016-04-22 DIAGNOSIS — M9903 Segmental and somatic dysfunction of lumbar region: Secondary | ICD-10-CM | POA: Diagnosis not present

## 2016-04-22 DIAGNOSIS — M5126 Other intervertebral disc displacement, lumbar region: Secondary | ICD-10-CM | POA: Diagnosis not present

## 2016-04-22 DIAGNOSIS — M461 Sacroiliitis, not elsewhere classified: Secondary | ICD-10-CM | POA: Diagnosis not present

## 2016-04-22 DIAGNOSIS — M4726 Other spondylosis with radiculopathy, lumbar region: Secondary | ICD-10-CM | POA: Diagnosis not present

## 2016-04-27 DIAGNOSIS — M5127 Other intervertebral disc displacement, lumbosacral region: Secondary | ICD-10-CM | POA: Diagnosis not present

## 2016-04-27 DIAGNOSIS — M5137 Other intervertebral disc degeneration, lumbosacral region: Secondary | ICD-10-CM | POA: Diagnosis not present

## 2016-04-27 DIAGNOSIS — R293 Abnormal posture: Secondary | ICD-10-CM | POA: Diagnosis not present

## 2016-04-27 DIAGNOSIS — M461 Sacroiliitis, not elsewhere classified: Secondary | ICD-10-CM | POA: Diagnosis not present

## 2016-04-27 DIAGNOSIS — M6240 Contracture of muscle, unspecified site: Secondary | ICD-10-CM | POA: Diagnosis not present

## 2016-04-27 DIAGNOSIS — M4726 Other spondylosis with radiculopathy, lumbar region: Secondary | ICD-10-CM | POA: Diagnosis not present

## 2016-04-27 DIAGNOSIS — M5126 Other intervertebral disc displacement, lumbar region: Secondary | ICD-10-CM | POA: Diagnosis not present

## 2016-04-27 DIAGNOSIS — M9903 Segmental and somatic dysfunction of lumbar region: Secondary | ICD-10-CM | POA: Diagnosis not present

## 2016-04-27 DIAGNOSIS — M791 Myalgia: Secondary | ICD-10-CM | POA: Diagnosis not present

## 2016-04-29 DIAGNOSIS — M5127 Other intervertebral disc displacement, lumbosacral region: Secondary | ICD-10-CM | POA: Diagnosis not present

## 2016-04-29 DIAGNOSIS — M461 Sacroiliitis, not elsewhere classified: Secondary | ICD-10-CM | POA: Diagnosis not present

## 2016-04-29 DIAGNOSIS — M4726 Other spondylosis with radiculopathy, lumbar region: Secondary | ICD-10-CM | POA: Diagnosis not present

## 2016-04-29 DIAGNOSIS — M5126 Other intervertebral disc displacement, lumbar region: Secondary | ICD-10-CM | POA: Diagnosis not present

## 2016-04-29 DIAGNOSIS — M6240 Contracture of muscle, unspecified site: Secondary | ICD-10-CM | POA: Diagnosis not present

## 2016-04-29 DIAGNOSIS — M791 Myalgia: Secondary | ICD-10-CM | POA: Diagnosis not present

## 2016-04-29 DIAGNOSIS — M9903 Segmental and somatic dysfunction of lumbar region: Secondary | ICD-10-CM | POA: Diagnosis not present

## 2016-04-29 DIAGNOSIS — M5137 Other intervertebral disc degeneration, lumbosacral region: Secondary | ICD-10-CM | POA: Diagnosis not present

## 2016-04-29 DIAGNOSIS — R293 Abnormal posture: Secondary | ICD-10-CM | POA: Diagnosis not present

## 2016-05-06 DIAGNOSIS — M9903 Segmental and somatic dysfunction of lumbar region: Secondary | ICD-10-CM | POA: Diagnosis not present

## 2016-05-06 DIAGNOSIS — M6240 Contracture of muscle, unspecified site: Secondary | ICD-10-CM | POA: Diagnosis not present

## 2016-05-06 DIAGNOSIS — M5126 Other intervertebral disc displacement, lumbar region: Secondary | ICD-10-CM | POA: Diagnosis not present

## 2016-05-06 DIAGNOSIS — M5137 Other intervertebral disc degeneration, lumbosacral region: Secondary | ICD-10-CM | POA: Diagnosis not present

## 2016-05-06 DIAGNOSIS — M461 Sacroiliitis, not elsewhere classified: Secondary | ICD-10-CM | POA: Diagnosis not present

## 2016-05-06 DIAGNOSIS — M4726 Other spondylosis with radiculopathy, lumbar region: Secondary | ICD-10-CM | POA: Diagnosis not present

## 2016-05-06 DIAGNOSIS — M791 Myalgia: Secondary | ICD-10-CM | POA: Diagnosis not present

## 2016-05-06 DIAGNOSIS — R293 Abnormal posture: Secondary | ICD-10-CM | POA: Diagnosis not present

## 2016-05-06 DIAGNOSIS — M5127 Other intervertebral disc displacement, lumbosacral region: Secondary | ICD-10-CM | POA: Diagnosis not present

## 2016-05-07 DIAGNOSIS — M461 Sacroiliitis, not elsewhere classified: Secondary | ICD-10-CM | POA: Diagnosis not present

## 2016-05-07 DIAGNOSIS — M5126 Other intervertebral disc displacement, lumbar region: Secondary | ICD-10-CM | POA: Diagnosis not present

## 2016-05-07 DIAGNOSIS — M791 Myalgia: Secondary | ICD-10-CM | POA: Diagnosis not present

## 2016-05-07 DIAGNOSIS — R293 Abnormal posture: Secondary | ICD-10-CM | POA: Diagnosis not present

## 2016-05-07 DIAGNOSIS — M6240 Contracture of muscle, unspecified site: Secondary | ICD-10-CM | POA: Diagnosis not present

## 2016-05-07 DIAGNOSIS — M5127 Other intervertebral disc displacement, lumbosacral region: Secondary | ICD-10-CM | POA: Diagnosis not present

## 2016-05-07 DIAGNOSIS — M4726 Other spondylosis with radiculopathy, lumbar region: Secondary | ICD-10-CM | POA: Diagnosis not present

## 2016-05-07 DIAGNOSIS — M5137 Other intervertebral disc degeneration, lumbosacral region: Secondary | ICD-10-CM | POA: Diagnosis not present

## 2016-05-07 DIAGNOSIS — M9903 Segmental and somatic dysfunction of lumbar region: Secondary | ICD-10-CM | POA: Diagnosis not present

## 2016-05-11 DIAGNOSIS — M9903 Segmental and somatic dysfunction of lumbar region: Secondary | ICD-10-CM | POA: Diagnosis not present

## 2016-05-11 DIAGNOSIS — M5127 Other intervertebral disc displacement, lumbosacral region: Secondary | ICD-10-CM | POA: Diagnosis not present

## 2016-05-11 DIAGNOSIS — R293 Abnormal posture: Secondary | ICD-10-CM | POA: Diagnosis not present

## 2016-05-11 DIAGNOSIS — M6240 Contracture of muscle, unspecified site: Secondary | ICD-10-CM | POA: Diagnosis not present

## 2016-05-11 DIAGNOSIS — M791 Myalgia: Secondary | ICD-10-CM | POA: Diagnosis not present

## 2016-05-11 DIAGNOSIS — M4726 Other spondylosis with radiculopathy, lumbar region: Secondary | ICD-10-CM | POA: Diagnosis not present

## 2016-05-11 DIAGNOSIS — M461 Sacroiliitis, not elsewhere classified: Secondary | ICD-10-CM | POA: Diagnosis not present

## 2016-05-11 DIAGNOSIS — M5137 Other intervertebral disc degeneration, lumbosacral region: Secondary | ICD-10-CM | POA: Diagnosis not present

## 2016-05-11 DIAGNOSIS — M5126 Other intervertebral disc displacement, lumbar region: Secondary | ICD-10-CM | POA: Diagnosis not present

## 2016-05-14 DIAGNOSIS — M791 Myalgia: Secondary | ICD-10-CM | POA: Diagnosis not present

## 2016-05-14 DIAGNOSIS — R293 Abnormal posture: Secondary | ICD-10-CM | POA: Diagnosis not present

## 2016-05-14 DIAGNOSIS — M4726 Other spondylosis with radiculopathy, lumbar region: Secondary | ICD-10-CM | POA: Diagnosis not present

## 2016-05-14 DIAGNOSIS — M5126 Other intervertebral disc displacement, lumbar region: Secondary | ICD-10-CM | POA: Diagnosis not present

## 2016-05-14 DIAGNOSIS — M6240 Contracture of muscle, unspecified site: Secondary | ICD-10-CM | POA: Diagnosis not present

## 2016-05-14 DIAGNOSIS — M461 Sacroiliitis, not elsewhere classified: Secondary | ICD-10-CM | POA: Diagnosis not present

## 2016-05-14 DIAGNOSIS — M5127 Other intervertebral disc displacement, lumbosacral region: Secondary | ICD-10-CM | POA: Diagnosis not present

## 2016-05-14 DIAGNOSIS — M5137 Other intervertebral disc degeneration, lumbosacral region: Secondary | ICD-10-CM | POA: Diagnosis not present

## 2016-05-14 DIAGNOSIS — M9903 Segmental and somatic dysfunction of lumbar region: Secondary | ICD-10-CM | POA: Diagnosis not present

## 2016-05-19 DIAGNOSIS — M9903 Segmental and somatic dysfunction of lumbar region: Secondary | ICD-10-CM | POA: Diagnosis not present

## 2016-05-19 DIAGNOSIS — M791 Myalgia: Secondary | ICD-10-CM | POA: Diagnosis not present

## 2016-05-19 DIAGNOSIS — R293 Abnormal posture: Secondary | ICD-10-CM | POA: Diagnosis not present

## 2016-05-19 DIAGNOSIS — M461 Sacroiliitis, not elsewhere classified: Secondary | ICD-10-CM | POA: Diagnosis not present

## 2016-05-19 DIAGNOSIS — M4726 Other spondylosis with radiculopathy, lumbar region: Secondary | ICD-10-CM | POA: Diagnosis not present

## 2016-05-19 DIAGNOSIS — M5126 Other intervertebral disc displacement, lumbar region: Secondary | ICD-10-CM | POA: Diagnosis not present

## 2016-05-19 DIAGNOSIS — M5137 Other intervertebral disc degeneration, lumbosacral region: Secondary | ICD-10-CM | POA: Diagnosis not present

## 2016-05-19 DIAGNOSIS — M6240 Contracture of muscle, unspecified site: Secondary | ICD-10-CM | POA: Diagnosis not present

## 2016-05-19 DIAGNOSIS — M5127 Other intervertebral disc displacement, lumbosacral region: Secondary | ICD-10-CM | POA: Diagnosis not present

## 2016-05-20 DIAGNOSIS — M5127 Other intervertebral disc displacement, lumbosacral region: Secondary | ICD-10-CM | POA: Diagnosis not present

## 2016-05-20 DIAGNOSIS — M6240 Contracture of muscle, unspecified site: Secondary | ICD-10-CM | POA: Diagnosis not present

## 2016-05-20 DIAGNOSIS — M791 Myalgia: Secondary | ICD-10-CM | POA: Diagnosis not present

## 2016-05-20 DIAGNOSIS — R293 Abnormal posture: Secondary | ICD-10-CM | POA: Diagnosis not present

## 2016-05-20 DIAGNOSIS — M5137 Other intervertebral disc degeneration, lumbosacral region: Secondary | ICD-10-CM | POA: Diagnosis not present

## 2016-05-20 DIAGNOSIS — M5126 Other intervertebral disc displacement, lumbar region: Secondary | ICD-10-CM | POA: Diagnosis not present

## 2016-05-20 DIAGNOSIS — M4726 Other spondylosis with radiculopathy, lumbar region: Secondary | ICD-10-CM | POA: Diagnosis not present

## 2016-05-20 DIAGNOSIS — M9903 Segmental and somatic dysfunction of lumbar region: Secondary | ICD-10-CM | POA: Diagnosis not present

## 2016-05-20 DIAGNOSIS — M461 Sacroiliitis, not elsewhere classified: Secondary | ICD-10-CM | POA: Diagnosis not present

## 2016-05-24 DIAGNOSIS — M9903 Segmental and somatic dysfunction of lumbar region: Secondary | ICD-10-CM | POA: Diagnosis not present

## 2016-05-24 DIAGNOSIS — R293 Abnormal posture: Secondary | ICD-10-CM | POA: Diagnosis not present

## 2016-05-24 DIAGNOSIS — M6240 Contracture of muscle, unspecified site: Secondary | ICD-10-CM | POA: Diagnosis not present

## 2016-05-24 DIAGNOSIS — M5137 Other intervertebral disc degeneration, lumbosacral region: Secondary | ICD-10-CM | POA: Diagnosis not present

## 2016-05-24 DIAGNOSIS — M5127 Other intervertebral disc displacement, lumbosacral region: Secondary | ICD-10-CM | POA: Diagnosis not present

## 2016-05-24 DIAGNOSIS — M4726 Other spondylosis with radiculopathy, lumbar region: Secondary | ICD-10-CM | POA: Diagnosis not present

## 2016-05-27 DIAGNOSIS — M6240 Contracture of muscle, unspecified site: Secondary | ICD-10-CM | POA: Diagnosis not present

## 2016-05-27 DIAGNOSIS — M5126 Other intervertebral disc displacement, lumbar region: Secondary | ICD-10-CM | POA: Diagnosis not present

## 2016-05-27 DIAGNOSIS — M5137 Other intervertebral disc degeneration, lumbosacral region: Secondary | ICD-10-CM | POA: Diagnosis not present

## 2016-05-27 DIAGNOSIS — M461 Sacroiliitis, not elsewhere classified: Secondary | ICD-10-CM | POA: Diagnosis not present

## 2016-05-27 DIAGNOSIS — M4726 Other spondylosis with radiculopathy, lumbar region: Secondary | ICD-10-CM | POA: Diagnosis not present

## 2016-05-27 DIAGNOSIS — R293 Abnormal posture: Secondary | ICD-10-CM | POA: Diagnosis not present

## 2016-05-27 DIAGNOSIS — M9903 Segmental and somatic dysfunction of lumbar region: Secondary | ICD-10-CM | POA: Diagnosis not present

## 2016-05-27 DIAGNOSIS — M5127 Other intervertebral disc displacement, lumbosacral region: Secondary | ICD-10-CM | POA: Diagnosis not present

## 2016-05-27 DIAGNOSIS — M791 Myalgia: Secondary | ICD-10-CM | POA: Diagnosis not present

## 2016-05-28 ENCOUNTER — Ambulatory Visit (INDEPENDENT_AMBULATORY_CARE_PROVIDER_SITE_OTHER): Payer: Medicare Other

## 2016-05-28 VITALS — BP 152/80 | HR 72 | Temp 98.3°F | Ht 60.0 in | Wt 102.0 lb

## 2016-05-28 DIAGNOSIS — Z0000 Encounter for general adult medical examination without abnormal findings: Secondary | ICD-10-CM

## 2016-05-28 DIAGNOSIS — Z23 Encounter for immunization: Secondary | ICD-10-CM | POA: Diagnosis not present

## 2016-05-28 NOTE — Progress Notes (Signed)
Subjective:   Caroline Snyder is a 75 y.o. female who presents for an Initial Medicare Annual Wellness Visit.  Review of Systems     Cardiac Risk Factors include: advanced age (>28men, >52 women)     Objective:    Today's Vitals   05/28/16 1559  BP: (!) 152/80  Pulse: 72  Temp: 98.3 F (36.8 C)  TempSrc: Oral  SpO2: 96%  Weight: 102 lb (46.3 kg)  Height: 5' (1.524 m)  PainSc: 0-No pain   Body mass index is 19.92 kg/m.   Current Medications (verified) Outpatient Encounter Prescriptions as of 05/28/2016  Medication Sig  . acyclovir (ZOVIRAX) 200 MG capsule One daily as needed to prevent vaginal discomfort related to viral infection.  Marland Kitchen aspirin 81 MG tablet Take 81 mg by mouth daily. Take 1 tablet daily to prevent heart attack, stroke and clotting.  . Cholecalciferol (HM VITAMIN D3) 2000 UNITS CAPS Take by mouth. Take 1 tablet daily  . Cranberry 250 MG TABS Take by mouth. Take 1 tablet daily  . cycloSPORINE (RESTASIS) 0.05 % ophthalmic emulsion 1 drop 2 (two) times daily.  Marland Kitchen estradiol (ESTRACE) 0.1 MG/GM vaginal cream Place [id number] Applicatorfuls vaginally 2 (two) times a week.  . mirtazapine (REMERON) 15 MG tablet Take 1 tablet (15 mg total) by mouth at bedtime as needed. For sleep  . Multiple Vitamin (MULTI VITAMIN DAILY PO) Take by mouth. Centrum Silver for Women Take 1 tablet daily  . Multiple Vitamins-Minerals (ICAPS) TABS Take 1 tablet by mouth once.   . nizatidine (AXID) 150 MG capsule Take one tablet twice daily as needed to reduce stomach acid  . pravastatin (PRAVACHOL) 40 MG tablet Ne each night to lower cholesterol  . [DISCONTINUED] pravastatin (PRAVACHOL) 40 MG tablet OVERDUE FOR CHOLESTEROL CHECK 1 tablet by mouth daily   No facility-administered encounter medications on file as of 05/28/2016.     Allergies (verified) Vioxx [rofecoxib]   History: Past Medical History:  Diagnosis Date  . Allergy   . 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 Bunion  07/01/2010  . Depression 05/05/2007  . External hemorrhoids without mention of complication [id number]  . GERD (gastroesophageal reflux disease) 2000  . Hyperlipidemia 06/29/2006  . Memory loss 04/15/2003  . Migraine without aura, without mention of intractable migraine without mention of status migrainosus 03/24/1999  . Osteoporosis, unspecified 04/15/2004  . 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 Unspecified essential hypertension 11/22/2007   Past Surgical History:  Procedure Laterality Date  . COLONOSCOPY  06-16-2007   Internal hemorrhoids,laxity of anal sphincter,polyp at 25cm form anal verge, tortuous sigmoid colon. Dr.Orr   . EYE SURGERY Bilateral 2010   cataract Dr. Bing Plume  . VAGINAL HYSTERECTOMY  1980   Family History  Problem Relation Age of Onset  . Alzheimer's disease Mother   . Diabetes Mother   . Transient ischemic attack Mother   . Heart disease Mother     MI, CHF  . Alcohol abuse Father   . Kidney disease Father   . Hypertension Sister   . Hypertension Brother   . Heart disease Brother   . Hypertension Brother   . Hypertension Sister   . Hypertension Brother   . Heart disease Son 3    CABG  . Colon cancer Neg Hx   . Stomach cancer Neg Hx    Social History   Occupational History  . Retired    Social History Main Topics  . Smoking status: Never Smoker  . Smokeless  tobacco: Never Used  . Alcohol use No  . Drug use: No  . Sexual activity: Not Currently    Tobacco Counseling Counseling given: No   Activities of Daily Living In your present state of health, do you have any difficulty performing the following activities: 05/28/2016  Hearing? N  Vision? N  Difficulty concentrating or making decisions? Y  Walking or climbing stairs? N  Dressing or bathing? N  Doing errands, shopping? N  Preparing Food and eating ? N  Using the Toilet? N  In the past six months, have you accidently leaked urine? N  Do you have problems with loss of  bowel control? N  Managing your Medications? N  Managing your Finances? N  Housekeeping or managing your Housekeeping? N  Some recent data might be hidden    Immunizations and Health Maintenance Immunization History  Administered Date(s) Administered  . Influenza,inj,Quad PF,36+ Mos 07/19/2013, 05/28/2016  . Influenza-Unspecified 05/23/2015  . PPD Test 11/30/2013, 05/30/2015  . Pneumococcal Conjugate-13 05/28/2016  . Zoster 11/19/2009   Health Maintenance Due  Topic Date Due  . Samul Dada  12/25/2015    Patient Care Team: Estill Dooms, MD as PCP - General (Internal Medicine) [name], MD as Consulting Physician (Ophthalmology) Calvert Cantor, MD as Consulting Physician (Ophthalmology) Terrance Mass, MD as Consulting Physician (Gynecology)  Indicate any recent Medical Services you may have received from other than [facility] providers in the past year (date may be approximate).     Assessment:   This is a routine wellness examination for [name].  Hearing/Vision screen  Visual Acuity Screening   Right eye Left eye Both eyes  Without correction:     With correction: 20/30 20/25 20/25-1  Comments: Next eye exam in Jan. 2018.  Hearing Screening Comments: Unsure of last hearing screening.   Dietary issues and exercise activities discussed: Current Exercise Habits: Home exercise routine, Type of exercise: Other - see comments (Goes dancing every Friday and Saturday night. ), Time (Minutes): > 60, Frequency (Times/Week): 2, Weekly Exercise (Minutes/Week): 0, Intensity: Moderate  Goals    . Increase water intake          Starting 05/28/16, I will attempt to increase water intake and get at least 5 cups of water per day.       Depression Screen PHQ 2/9 Scores 05/28/2016 06/24/2015 03/13/2014 03/13/2014  PHQ - 2 Score 0 0 0 0    Fall Risk Fall Risk  05/28/2016 06/24/2015 11/06/2014 09/18/2014 03/13/2014  Falls in the past year? No No No No No    Cognitive  Function: MMSE - Mini Mental State Exam 05/28/2016  Orientation to time 5  Orientation to Place 5  Registration 3  Attention/ Calculation 5  Recall 2  Language- name 2 objects 2  Language- repeat 1  Language- follow 3 step command 3  Language- read & follow direction 1  Write a sentence 1  Copy design 1  Total score 29        Screening Tests Health Maintenance  Topic Date Due  . TETANUS/TDAP  12/25/2015  . MAMMOGRAM  05/29/2016  . COLONOSCOPY  02/10/2017  . PNA vac Low Risk Adult (2 of 2 - PPSV23) 05/28/2017  . INFLUENZA VACCINE  Completed  . DEXA SCAN  Completed  . ZOSTAVAX  Completed      Plan:    I have personally reviewed and addressed the Medicare Annual Wellness questionnaire and have noted the following in the patient's chart:  A. Medical  and social history B. Use of alcohol, tobacco or illicit drugs  C. Current medications and supplements D. Functional ability and status E.  Nutritional status F.  Physical activity G. Advance directives H. List of other physicians I.  Hospitalizations, surgeries, and ER visits in previous 12 months J.  [place] to include hearing, vision, cognitive, depression L. Referrals and appointments - none  In addition, I have reviewed and discussed with patient certain preventive protocols, quality metrics, and best practice recommendations. A written personalized care plan for preventive services as well as general preventive health recommendations were provided to patient.  See attached scanned questionnaire for additional information.   Signed,   Allyn Kenner, LPN Health Advisor   I have reviewed the health advisor's note and was available for consultation. I agree with documentation and plan.   Kannan Proia S. Perlie Gold  [facility] [address] [place], [place] [address] [phone] Cell (Monday-Friday 8 AM - 5 PM) [phone] After 5 PM and follow  prompts

## 2016-05-28 NOTE — Patient Instructions (Signed)
Caroline Snyder , Thank you for taking time to come for your Medicare Wellness Visit. I appreciate your ongoing commitment to your health goals. Please review the following plan we discussed and let me know if I can assist you in the future.   These are the goals we discussed: Goals    . Increase water intake          Starting 05/28/16, I will attempt to increase water intake and get at least 5 cups of water per day.        This is a list of the screening recommended for you and due dates:  Health Maintenance  Topic Date Due  . Pneumonia vaccines (1 of 2 - PCV13) 11/08/2004  . Tetanus Vaccine  12/25/2015  . Flu Shot  03/02/2016  . Mammogram  05/29/2016  . Colon Cancer Screening  02/10/2017  . DEXA scan (bone density measurement)  Completed  . Shingles Vaccine  Completed  Preventive Care for Adults  A healthy lifestyle and preventive care can promote health and wellness. Preventive health guidelines for adults include the following key practices.  . A routine yearly physical is a good way to check with your health care provider about your health and preventive screening. It is a chance to share any concerns and updates on your health and to receive a thorough exam.  . Visit your dentist for a routine exam and preventive care every 6 months. Brush your teeth twice a day and floss once a day. Good oral hygiene prevents tooth decay and gum disease.  . The frequency of eye exams is based on your age, health, family medical history, use  of contact lenses, and other factors. Follow your health care provider's ecommendations for frequency of eye exams.  . Eat a healthy diet. Foods like vegetables, fruits, whole grains, low-fat dairy products, and lean protein foods contain the nutrients you need without too many calories. Decrease your intake of foods high in solid fats, added sugars, and salt. Eat the right amount of calories for you. Get information about a proper diet from your health care  provider, if necessary.  . Regular physical exercise is one of the most important things you can do for your health. Most adults should get at least 150 minutes of moderate-intensity exercise (any activity that increases your heart rate and causes you to sweat) each week. In addition, most adults need muscle-strengthening exercises on 2 or more days a week.  Silver Sneakers may be a benefit available to you. To determine eligibility, you may visit the website: www.silversneakers.com or contact program at 610-725-0030 Mon-Fri between 8AM-8PM.   . Maintain a healthy weight. The body mass index (BMI) is a screening tool to identify possible weight problems. It provides an estimate of body fat based on height and weight. Your health care provider can find your BMI and can help you achieve or maintain a healthy weight.   For adults 20 years and older: ? A BMI below 18.5 is considered underweight. ? A BMI of 18.5 to 24.9 is normal. ? A BMI of 25 to 29.9 is considered overweight. ? A BMI of 30 and above is considered obese.   . Maintain normal blood lipids and cholesterol levels by exercising and minimizing your intake of saturated fat. Eat a balanced diet with plenty of fruit and vegetables. Blood tests for lipids and cholesterol should begin at age 69 and be repeated every 5 years. If your lipid or cholesterol levels are high, you  are over 72, or you are at high risk for heart disease, you may need your cholesterol levels checked more frequently. Ongoing high lipid and cholesterol levels should be treated with medicines if diet and exercise are not working.  . If you smoke, find out from your health care provider how to quit. If you do not use tobacco, please do not start.  . If you choose to drink alcohol, please do not consume more than 2 drinks per day. One drink is considered to be 12 ounces (355 mL) of beer, 5 ounces (148 mL) of wine, or 1.5 ounces (44 mL) of liquor.  . If you are 17-79 years  old, ask your health care provider if you should take aspirin to prevent strokes.  . Use sunscreen. Apply sunscreen liberally and repeatedly throughout the day. You should seek shade when your shadow is shorter than you. Protect yourself by wearing long sleeves, pants, a wide-brimmed hat, and sunglasses year round, whenever you are outdoors.  . Once a month, do a whole body skin exam, using a mirror to look at the skin on your back. Tell your health care provider of new moles, moles that have irregular borders, moles that are larger than a pencil eraser, or moles that have changed in shape or color.

## 2016-06-03 DIAGNOSIS — Z1231 Encounter for screening mammogram for malignant neoplasm of breast: Secondary | ICD-10-CM | POA: Diagnosis not present

## 2016-06-03 LAB — HM MAMMOGRAPHY

## 2016-06-04 ENCOUNTER — Encounter: Payer: Self-pay | Admitting: *Deleted

## 2016-06-29 ENCOUNTER — Ambulatory Visit: Payer: Self-pay | Admitting: Internal Medicine

## 2016-07-06 ENCOUNTER — Other Ambulatory Visit: Payer: Self-pay | Admitting: Internal Medicine

## 2016-07-14 ENCOUNTER — Telehealth: Payer: Self-pay | Admitting: *Deleted

## 2016-07-14 MED ORDER — ALPRAZOLAM 0.5 MG PO TABS: ORAL_TABLET | ORAL | 0 refills | Status: DC

## 2016-07-14 NOTE — Telephone Encounter (Signed)
Rx phoned into [facility]. Spoke with Ronalee Belts, Pharmacist.

## 2016-07-14 NOTE — Telephone Encounter (Signed)
Elmyra Ricks, daughter in law called and stated that patient's son was killed in a Tragic accident and patient needs something to help calm her. Killed in car accident in [place]. Would like a Rx called into [facility]. Please Advise.

## 2016-07-14 NOTE — Telephone Encounter (Signed)
Alprazolam 0.5 mg (30) One up to 4 times daily as needed for anxiety.

## 2016-07-20 ENCOUNTER — Ambulatory Visit: Payer: Medicare Other | Admitting: Internal Medicine

## 2016-08-31 ENCOUNTER — Ambulatory Visit (INDEPENDENT_AMBULATORY_CARE_PROVIDER_SITE_OTHER): Payer: Medicare Other | Admitting: Internal Medicine

## 2016-08-31 ENCOUNTER — Encounter: Payer: Self-pay | Admitting: Internal Medicine

## 2016-08-31 VITALS — BP 186/94 | HR 75 | Temp 97.7°F | Ht 60.0 in | Wt 104.0 lb

## 2016-08-31 DIAGNOSIS — F439 Reaction to severe stress, unspecified: Secondary | ICD-10-CM | POA: Diagnosis not present

## 2016-08-31 DIAGNOSIS — E785 Hyperlipidemia, unspecified: Secondary | ICD-10-CM | POA: Diagnosis not present

## 2016-08-31 DIAGNOSIS — R634 Abnormal weight loss: Secondary | ICD-10-CM | POA: Diagnosis not present

## 2016-08-31 DIAGNOSIS — I10 Essential (primary) hypertension: Secondary | ICD-10-CM | POA: Diagnosis not present

## 2016-08-31 DIAGNOSIS — G4700 Insomnia, unspecified: Secondary | ICD-10-CM

## 2016-08-31 DIAGNOSIS — Z634 Disappearance and death of family member: Secondary | ICD-10-CM | POA: Diagnosis not present

## 2016-08-31 HISTORY — DX: Disappearance and death of family member: Z63.4

## 2016-08-31 MED ORDER — PRAVASTATIN SODIUM 40 MG PO TABS: ORAL_TABLET | ORAL | 4 refills | Status: DC

## 2016-08-31 MED ORDER — LOSARTAN POTASSIUM 50 MG PO TABS: ORAL_TABLET | ORAL | 3 refills | Status: DC

## 2016-08-31 NOTE — Progress Notes (Signed)
Facility  [facility]    Place of Service:   OFFICE    Allergies  Allergen Reactions  . Vioxx [Rofecoxib] Nausea Only    Pt. Can't remember what reaction was but asks to leave on her profile.     Chief Complaint  Patient presents with  . Medical Management of Chronic Issues    4 month medication management blood pressure, depression. (son died Aug 02, 2016 in truck accident)    HPI:  Last seen 02/16/17 for cough , back pain, and BP.  Essential hypertension - wildly out of control. Likely related to the death of heer son by trucking accident in Dec 2017. Was cremated and buried last week.  Death of child - son died on the road in his truck in trying to avoid a collision  Loss of weight - stable  Situational stress - related to the death of her son.  Insomnia, unspecified type - worried about a lot of things. Her son used to help her with insurance, bill paying, and finances.    Medications: Patient's Medications  New Prescriptions   No medications on file  Previous Medications   ACYCLOVIR (ZOVIRAX) 200 MG CAPSULE    One daily as needed to prevent vaginal discomfort related to viral infection.   ALPRAZOLAM (XANAX) 0.5 MG TABLET    Take one tablet by mouth up to four times daily as needed for anxiety   ASPIRIN 81 MG TABLET    Take 81 mg by mouth daily. Take 1 tablet daily to prevent heart attack, stroke and clotting.   CHOLECALCIFEROL (HM VITAMIN D3) 2000 UNITS CAPS    Take by mouth. Take 1 tablet daily   CRANBERRY 250 MG TABS    Take by mouth. Take 1 tablet daily   CYCLOSPORINE (RESTASIS) 0.05 % OPHTHALMIC EMULSION    1 drop 2 (two) times daily.   ESTRADIOL (ESTRACE) 0.1 MG/GM VAGINAL CREAM    Place 1.00 Applicatorfuls vaginally 2 (two) times a week.   MIRTAZAPINE (REMERON) 15 MG TABLET    Take 1 tablet (15 mg total) by mouth at bedtime as needed. For sleep   MULTIPLE VITAMIN (MULTI VITAMIN DAILY PO)    Take by mouth. Centrum Silver for Women Take 1 tablet daily   MULTIPLE  VITAMINS-MINERALS (ICAPS) TABS    Take 1 tablet by mouth once.    NIZATIDINE (AXID) 150 MG CAPSULE    Take one tablet twice daily as needed to reduce stomach acid   PRAVASTATIN (PRAVACHOL) 40 MG TABLET    Ne each night to lower cholesterol   PRAVASTATIN (PRAVACHOL) 40 MG TABLET    TAKE 1 TABLET BY MOUTH DAILY  Modified Medications   No medications on file  Discontinued Medications   No medications on file    Review of Systems  Constitutional: Positive for unexpected weight change (Weight loss is improving now). Negative for fatigue.  HENT: Negative.   Eyes: Negative.   Respiratory: Positive for cough.   Cardiovascular: Positive for palpitations.  Gastrointestinal: Positive for diarrhea.  Genitourinary:       History of stress incontinence. Some increase in frequency. Denies nocturia or dysuria. Complains of pain on intercourse.  Musculoskeletal: Negative.   Skin: Negative.   Neurological:       Patient has noted an area of altered sensation of the right anterior thigh just below a cluster of superficial varicose veins. It is not painful.  Hematological: Negative.   Psychiatric/Behavioral:       Some sadness.  Vitals:   08/31/16 1553  BP: (!) 186/94  Pulse: 75  Temp: 97.7 F (36.5 C)  TempSrc: Oral  SpO2: 97%  Weight: 104 lb (47.2 kg)  Height: 5' (1.524 m)   Body mass index is 20.31 kg/m. Wt Readings from Last 3 Encounters:  08/31/16 104 lb (47.2 kg)  05/28/16 102 lb (46.3 kg)  02/17/16 103 lb (46.7 kg)      Physical Exam  Constitutional: She is oriented to person, place, and time. She appears well-developed and well-nourished. No distress.  HENT:  Right Ear: External ear normal.  Left Ear: External ear normal.  Nose: Nose normal.  Mouth/Throat: Oropharynx is clear and moist.  Eyes: Conjunctivae are normal. Pupils are equal, round, and reactive to light. Right eye exhibits no discharge. Left eye exhibits no discharge. No scleral icterus.  Corrective lenses    Neck: Normal range of motion. Neck supple. No JVD present. No tracheal deviation present. No thyromegaly present.  Cardiovascular: Normal rate, regular rhythm, normal heart sounds and intact distal pulses.  Exam reveals no gallop and no friction rub.   No murmur heard. Pulmonary/Chest: Effort normal. No respiratory distress. She has no wheezes. She has no rales. She exhibits no tenderness.  Persistent coughing.  Abdominal: Soft. Bowel sounds are normal. She exhibits no distension and no mass. There is no tenderness. There is no rebound.  Musculoskeletal: Normal range of motion. She exhibits no edema or tenderness.  Bilateral bunions  Lymphadenopathy:    She has no cervical adenopathy.  Neurological: She is alert and oriented to person, place, and time. No cranial nerve deficit. Coordination normal.  Skin: No rash noted. No erythema. No pallor.  Right leg anteriorly has a cluster of superficial varicose veins.  Psychiatric: She has a normal mood and affect. Judgment and thought content normal.    Labs reviewed: Lab Summary Latest Ref Rng & Units 02/18/2016 05/13/2015 09/13/2014  Hemoglobin 11.1 - 15.9 g/dL (None) 13.0 (None)  Hematocrit 34.0 - 46.6 % (None) 38.4 (None)  White count 3.4 - 10.8 x10E3/uL (None) 6.3 (None)  Platelet count - (None) (None) (None)  Sodium 134 - 144 mmol/L (None) 145(H) 140  Potassium 3.5 - 5.2 mmol/L (None) 4.2 4.8  Calcium 8.4 - 10.5 mg/dL 8.8 10.1 9.9  Phosphorus - (None) (None) (None)  Creatinine 0.57 - 1.00 mg/dL (None) 0.77 0.88  AST 0 - 40 IU/L (None) 22 20  Alk Phos 39 - 117 IU/L (None) 58 52  Bilirubin 0.0 - 1.2 mg/dL (None) <0.2 <0.2  Glucose 65 - 99 mg/dL (None) 84 93  Cholesterol - (None) (None) (None)  HDL cholesterol - (None) (None) (None)  Triglycerides - (None) (None) (None)  LDL Direct - (None) (None) (None)  LDL Calc - (None) (None) (None)  Total protein - (None) (None) (None)  Albumin 3.5 - 4.8 g/dL (None) 4.6 4.3  Some recent data  might be hidden   Lab Results  Component Value Date   TSH 1.730 05/13/2015   TSH 3.250 08/30/2013   Lab Results  Component Value Date   BUN 16 05/13/2015   BUN 17 09/13/2014   BUN 14 03/11/2014   No results found for: HGBA1C  Assessment/Plan 1. Death of child stressed  2. Essential hypertension - losartan (COZAAR) 50 MG tablet; One daily to help control BP  Dispense: 30 tablet; Refill: 3  3. Loss of weight stable  4. Situational stress  5. Insomnia, unspecified type  6. Hyperlipidemia, unspecified hyperlipidemia type The current medical regimen   is effective;  continue present plan and medications.  

## 2016-09-02 DIAGNOSIS — F329 Major depressive disorder, single episode, unspecified: Secondary | ICD-10-CM | POA: Diagnosis not present

## 2016-09-09 DIAGNOSIS — F329 Major depressive disorder, single episode, unspecified: Secondary | ICD-10-CM | POA: Diagnosis not present

## 2016-09-15 ENCOUNTER — Ambulatory Visit: Payer: Medicare Other | Admitting: Internal Medicine

## 2016-09-16 DIAGNOSIS — F329 Major depressive disorder, single episode, unspecified: Secondary | ICD-10-CM | POA: Diagnosis not present

## 2016-09-23 DIAGNOSIS — F329 Major depressive disorder, single episode, unspecified: Secondary | ICD-10-CM | POA: Diagnosis not present

## 2016-10-08 DIAGNOSIS — F329 Major depressive disorder, single episode, unspecified: Secondary | ICD-10-CM | POA: Diagnosis not present

## 2016-10-11 ENCOUNTER — Other Ambulatory Visit: Payer: Self-pay | Admitting: Internal Medicine

## 2016-10-13 ENCOUNTER — Ambulatory Visit (INDEPENDENT_AMBULATORY_CARE_PROVIDER_SITE_OTHER): Payer: Medicare Other | Admitting: Internal Medicine

## 2016-10-13 ENCOUNTER — Encounter: Payer: Self-pay | Admitting: Internal Medicine

## 2016-10-13 VITALS — BP 156/84 | HR 57 | Temp 97.8°F | Ht 60.0 in | Wt 103.0 lb

## 2016-10-13 DIAGNOSIS — N941 Unspecified dyspareunia: Secondary | ICD-10-CM | POA: Diagnosis not present

## 2016-10-13 DIAGNOSIS — F332 Major depressive disorder, recurrent severe without psychotic features: Secondary | ICD-10-CM

## 2016-10-13 DIAGNOSIS — E785 Hyperlipidemia, unspecified: Secondary | ICD-10-CM

## 2016-10-13 DIAGNOSIS — N952 Postmenopausal atrophic vaginitis: Secondary | ICD-10-CM

## 2016-10-13 DIAGNOSIS — Z634 Disappearance and death of family member: Secondary | ICD-10-CM

## 2016-10-13 DIAGNOSIS — I10 Essential (primary) hypertension: Secondary | ICD-10-CM

## 2016-10-13 MED ORDER — TRAZODONE HCL 150 MG PO TABS: ORAL_TABLET | ORAL | 5 refills | Status: DC

## 2016-10-13 MED ORDER — PRAVASTATIN SODIUM 40 MG PO TABS: ORAL_TABLET | ORAL | 4 refills | Status: DC

## 2016-10-13 MED ORDER — ACYCLOVIR 200 MG PO CAPS: ORAL_CAPSULE | ORAL | 3 refills | Status: DC

## 2016-10-13 MED ORDER — ESTRADIOL 0.1 MG/GM VA CREA: 2.0000 g | TOPICAL_CREAM | VAGINAL | 8 refills | Status: DC

## 2016-10-13 MED ORDER — ESTRADIOL 0.1 MG/GM VA CREA: 0.5000 | TOPICAL_CREAM | VAGINAL | 8 refills | Status: DC

## 2016-10-13 NOTE — Patient Instructions (Addendum)
Do not use losartan at this time.

## 2016-10-13 NOTE — Progress Notes (Signed)
Facility  [facility]    Place of Service:   OFFICE    Allergies  Allergen Reactions  . Vioxx [Rofecoxib] Nausea Only    Pt. Can't remember what reaction was but asks to leave on her profile.     Chief Complaint  Patient presents with  . Medical Management of Chronic Issues    2 week follow-up blood pressure check after starting Cozaar. Patient didn't start on the Cozaar. She didn't think she needed it. Has blood pressure readings from home.  She felt that the BP was high last visit because she was talking about her son. Started  to tear up just talking about it. "She is just so out of it today!" Can't sleep, wakes up, she can she the accident.     HPI:  Severe episode of recurrent major depressive disorder, without psychotic features ([place]) - continues to dwell on th untimely death of her son in a vehicular accident. Not sleeping despite the user of mirtazipine.. poor appetite. Says she is depressed.  Essential hypertension - home BP are normal more than half the times she checks. No headache, chest pain, or palpitations. She thinks the "stress" of her son;s death is contributing to rises in heer BP.  Death of child - adult son died in 2017-12-22in [place] of state.  Hyperlipidemia, unspecified hyperlipidemia type - cxontrolled  Dyspareunia in female - benefits from use of Zovirax from time to time.  Vaginal atrophy - benefits from use of estrogen cream    Medications: Patient's Medications  New Prescriptions   No medications on file  Previous Medications   ACYCLOVIR (ZOVIRAX) 200 MG CAPSULE    One daily as needed to prevent vaginal discomfort related to viral infection.   ALPRAZOLAM (XANAX) 0.5 MG TABLET    Take one tablet by mouth up to four times daily as needed for anxiety   ASPIRIN 81 MG TABLET    Take 81 mg by mouth daily. Take 1 tablet daily to prevent heart attack, stroke and clotting.   CHOLECALCIFEROL (HM VITAMIN D3) 2000 UNITS CAPS    Take by mouth. Take 1 tablet  daily   CRANBERRY 250 MG TABS    Take by mouth. Take 1 tablet daily   CYCLOSPORINE (RESTASIS) 0.05 % OPHTHALMIC EMULSION    1 drop 2 (two) times daily.   ESTRADIOL (ESTRACE) 0.1 MG/GM VAGINAL CREAM    Place 0.92 Applicatorfuls vaginally 2 (two) times a week.   LOSARTAN (COZAAR) 50 MG TABLET    One daily to help control BP   MIRTAZAPINE (REMERON) 15 MG TABLET    Take 1 tablet (15 mg total) by mouth at bedtime as needed. For sleep   MULTIPLE VITAMIN (MULTI VITAMIN DAILY PO)    Take by mouth. Centrum Silver for Women Take 1 tablet daily   MULTIPLE VITAMINS-MINERALS (ICAPS) TABS    Take 1 tablet by mouth once.    NIZATIDINE (AXID) 150 MG CAPSULE    Take one tablet twice daily as needed to reduce stomach acid   PRAVASTATIN (PRAVACHOL) 40 MG TABLET    nNe each night to lower cholesterol  Modified Medications   No medications on file  Discontinued Medications   PRAVASTATIN (PRAVACHOL) 40 MG TABLET    TAKE 1 TABLET BY MOUTH DAILY    Review of Systems  Constitutional: Positive for unexpected weight change (Weight loss is improving now). Negative for fatigue.  HENT: Negative.   Eyes: Negative.   Respiratory: Positive for  cough.   Cardiovascular: Positive for palpitations.  Gastrointestinal: Positive for diarrhea.  Genitourinary:       History of stress incontinence. Some increase in frequency. Denies nocturia or dysuria. Complains of pain on intercourse.  Musculoskeletal: Negative.   Skin: Negative.   Neurological:       Patient has noted an area of altered sensation of the right anterior thigh just below a cluster of superficial varicose veins. It is not painful.  Hematological: Negative.   Psychiatric/Behavioral:       Some sadness.    Vitals:   10/13/16 1611  BP: (!) 156/84  Pulse: (!) 57  Temp: 97.8 F (36.6 C)  TempSrc: Oral  SpO2: 98%  Weight: 103 lb (46.7 kg)  Height: 5' (1.524 m)   Body mass index is 20.12 kg/m. Wt Readings from Last 3 Encounters:  10/13/16 103 lb (46.7  kg)  08/31/16 104 lb (47.2 kg)  05/28/16 102 lb (46.3 kg)      Physical Exam  Constitutional: She is oriented to person, place, and time. She appears well-developed and well-nourished. No distress.  HENT:  Right Ear: External ear normal.  Left Ear: External ear normal.  Nose: Nose normal.  Mouth/Throat: Oropharynx is clear and moist.  Eyes: Conjunctivae are normal. Pupils are equal, round, and reactive to light. Right eye exhibits no discharge. Left eye exhibits no discharge. No scleral icterus.  Corrective lenses  Neck: Normal range of motion. Neck supple. No JVD present. No tracheal deviation present. No thyromegaly present.  Cardiovascular: Normal rate, regular rhythm, normal heart sounds and intact distal pulses.  Exam reveals no gallop and no friction rub.   No murmur heard. Pulmonary/Chest: Effort normal. No respiratory distress. She has no wheezes. She has no rales. She exhibits no tenderness.  Persistent coughing.  Abdominal: Soft. Bowel sounds are normal. She exhibits no distension and no mass. There is no tenderness. There is no rebound.  Musculoskeletal: Normal range of motion. She exhibits no edema or tenderness.  Bilateral bunions  Lymphadenopathy:    She has no cervical adenopathy.  Neurological: She is alert and oriented to person, place, and time. No cranial nerve deficit. Coordination normal.  Skin: No rash noted. No erythema. No pallor.  Right leg anteriorly has a cluster of superficial varicose veins.  Psychiatric: She has a normal mood and affect. Judgment and thought content normal.    Labs reviewed: Lab Summary Latest Ref Rng & Units 02/18/2016 05/13/2015  Hemoglobin 11.1 - 15.9 g/dL (None) 13.0  Hematocrit 34.0 - 46.6 % (None) 38.4  White count 3.4 - 10.8 x10E3/uL (None) 6.3  Platelet count - (None) (None)  Sodium 134 - 144 mmol/L (None) 145(H)  Potassium 3.5 - 5.2 mmol/L (None) 4.2  Calcium 8.4 - 10.5 mg/dL 8.8 10.1  Phosphorus - (None) (None)    Creatinine 0.57 - 1.00 mg/dL (None) 0.77  AST 0 - 40 IU/L (None) 22  Alk Phos 39 - 117 IU/L (None) 58  Bilirubin 0.0 - 1.2 mg/dL (None) <0.2  Glucose 65 - 99 mg/dL (None) 84  Cholesterol - (None) (None)  HDL cholesterol - (None) (None)  Triglycerides - (None) (None)  LDL Direct - (None) (None)  LDL Calc - (None) (None)  Total protein - (None) (None)  Albumin 3.5 - 4.8 g/dL (None) 4.6  Some recent data might be hidden   Lab Results  Component Value Date   TSH 1.730 05/13/2015   TSH 3.250 08/30/2013   Lab Results  Component Value Date  BUN 16 05/13/2015   BUN 17 09/13/2014   BUN 14 03/11/2014   No results found for: HGBA1C  Assessment/Plan  1. Severe episode of recurrent major depressive disorder, without psychotic features ([place]) -stop mirtazapine - traZODone (DESYREL) 150 MG tablet; One at bedtime for rest and depression  Dispense: 30 tablet; Refill: 5  2. Essential hypertension It is Ok to hold the losartan for now.  3. Death of child Related to her depression  4. Hyperlipidemia, unspecified hyperlipidemia type - pravastatin (PRAVACHOL) 40 MG tablet; One each night to lower cholesterol  Dispense: 90 tablet; Refill: 4  5. Dyspareunia in female - acyclovir (ZOVIRAX) 200 MG capsule; One daily as needed to prevent vaginal discomfort related to viral infection.  Dispense: 30 capsule; Refill: 3  6. Vaginal atrophy - estradiol (ESTRACE) 0.1 MG/GM vaginal cream; Place 0.5 Applicatorfuls vaginally 2 (two) times a week.  Dispense: 42.5 g; Refill: 8

## 2016-10-21 DIAGNOSIS — F329 Major depressive disorder, single episode, unspecified: Secondary | ICD-10-CM | POA: Diagnosis not present

## 2016-11-04 DIAGNOSIS — H353131 Nonexudative age-related macular degeneration, bilateral, early dry stage: Secondary | ICD-10-CM | POA: Diagnosis not present

## 2016-11-04 DIAGNOSIS — Z961 Presence of intraocular lens: Secondary | ICD-10-CM | POA: Diagnosis not present

## 2016-11-04 DIAGNOSIS — H5213 Myopia, bilateral: Secondary | ICD-10-CM | POA: Diagnosis not present

## 2016-11-04 DIAGNOSIS — H52223 Regular astigmatism, bilateral: Secondary | ICD-10-CM | POA: Diagnosis not present

## 2016-11-04 DIAGNOSIS — H524 Presbyopia: Secondary | ICD-10-CM | POA: Diagnosis not present

## 2016-11-04 DIAGNOSIS — H04123 Dry eye syndrome of bilateral lacrimal glands: Secondary | ICD-10-CM | POA: Diagnosis not present

## 2016-11-10 ENCOUNTER — Ambulatory Visit (INDEPENDENT_AMBULATORY_CARE_PROVIDER_SITE_OTHER): Payer: Medicare Other | Admitting: Internal Medicine

## 2016-11-10 ENCOUNTER — Encounter: Payer: Self-pay | Admitting: Internal Medicine

## 2016-11-10 VITALS — BP 142/72 | HR 73 | Temp 97.7°F | Ht 60.0 in | Wt 106.0 lb

## 2016-11-10 DIAGNOSIS — I10 Essential (primary) hypertension: Secondary | ICD-10-CM

## 2016-11-10 DIAGNOSIS — F332 Major depressive disorder, recurrent severe without psychotic features: Secondary | ICD-10-CM

## 2016-11-10 DIAGNOSIS — Z634 Disappearance and death of family member: Secondary | ICD-10-CM | POA: Diagnosis not present

## 2016-11-10 NOTE — Progress Notes (Signed)
Facility  [facility]    Place of Service:   OFFICE    Allergies  Allergen Reactions  . Vioxx [Rofecoxib] Nausea Only    Pt. Can't remember what reaction was but asks to leave on her profile.     Chief Complaint  Patient presents with  . Medical Management of Chronic Issues    4 week follow up on blood pressure. Did not start the Cozaar, wanted to wait to see what BP would be like today.    HPI:  Last seen 10/13/16.  BP: was told it was OK to continue to hold the Cozaar at her last visit. I think it is adequately controlled today.  Depression:   was told to stop mirtazapine and start trazodone. She uses the trazodone rarely. Finds she sleeps better the nights she goes dancing. Sometimes she just starts crying in memory of here son. Currently seeing Lyn Norris at [facility] on [address] [place], [place], [place] [address].   Medications: Patient's Medications  New Prescriptions   No medications on file  Previous Medications   ACYCLOVIR (ZOVIRAX) 200 MG CAPSULE    One daily as needed to prevent vaginal discomfort related to viral infection.   ALPRAZOLAM (XANAX) 0.5 MG TABLET    Take one tablet by mouth up to four times daily as needed for anxiety   ASPIRIN 81 MG TABLET    Take 81 mg by mouth daily. Take 1 tablet daily to prevent heart attack, stroke and clotting.   CHOLECALCIFEROL (HM VITAMIN D3) 2000 UNITS CAPS    Take by mouth. Take 1 tablet daily   CRANBERRY 250 MG TABS    Take by mouth. Take 1 tablet daily   CYCLOSPORINE (RESTASIS) 0.05 % OPHTHALMIC EMULSION    1 drop 2 (two) times daily.   ESTRADIOL (ESTRACE) 0.1 MG/GM VAGINAL CREAM    Place 0.5 Applicatorfuls vaginally 2 (two) times a week.   LOSARTAN (COZAAR) 50 MG TABLET    One daily to help control BP   MULTIPLE VITAMIN (MULTI VITAMIN DAILY PO)    Take by mouth. Centrum Silver for Women Take 1 tablet daily   MULTIPLE VITAMINS-MINERALS (ICAPS) TABS    Take 1 tablet by mouth once.    NIZATIDINE (AXID) 150 MG CAPSULE    Take one tablet twice daily as needed to reduce stomach acid   PRAVASTATIN (PRAVACHOL) 40 MG TABLET    One each night to lower cholesterol   TRAZODONE (DESYREL) 150 MG TABLET    One at bedtime for rest and depression  Modified Medications   No medications on file  Discontinued Medications   No medications on file    Review of Systems  Constitutional: Positive for unexpected weight change (Weight loss is improving now). Negative for fatigue.  HENT: Negative.   Eyes: Negative.   Respiratory: Positive for cough.   Cardiovascular: Positive for palpitations.  Gastrointestinal: Positive for diarrhea.  Genitourinary:       History of stress incontinence. Some increase in frequency. Denies nocturia or dysuria. Complains of pain on intercourse.  Musculoskeletal: Negative.   Skin: Negative.   Neurological:       Patient has noted an area of altered sensation of the right anterior thigh just below a cluster of superficial varicose veins. It is not painful.  Hematological: Negative.   Psychiatric/Behavioral:       Some sadness.    Vitals:   11/10/16 1526  BP: (!) 142/72  Pulse:  73  Temp: 97.7 F (36.5 C)  TempSrc: Oral  SpO2: 97%  Weight: 106 lb (48.1 kg)  Height: 5' (1.524 m)   Body mass index is 20.7 kg/m. Wt Readings from Last 3 Encounters:  11/10/16 106 lb (48.1 kg)  10/13/16 103 lb (46.7 kg)  08/31/16 104 lb (47.2 kg)      Physical Exam  Constitutional: She is oriented to person, place, and time. She appears well-developed and well-nourished. No distress.  HENT:  Right Ear: External ear normal.  Left Ear: External ear normal.  Nose: Nose normal.  Mouth/Throat: Oropharynx is clear and moist.  Eyes: Conjunctivae are normal. Pupils are equal, round, and reactive to light. Right eye exhibits no discharge. Left eye exhibits no discharge. No scleral icterus.  Corrective lenses  Neck: Normal range of motion. Neck supple. No JVD present.  No tracheal deviation present. No thyromegaly present.  Cardiovascular: Normal rate, regular rhythm, normal heart sounds and intact distal pulses.  Exam reveals no gallop and no friction rub.   No murmur heard. Pulmonary/Chest: Effort normal. No respiratory distress. She has no wheezes. She has no rales. She exhibits no tenderness.  Persistent coughing.  Abdominal: Soft. Bowel sounds are normal. She exhibits no distension and no mass. There is no tenderness. There is no rebound.  Musculoskeletal: Normal range of motion. She exhibits no edema or tenderness.  Bilateral bunions  Lymphadenopathy:    She has no cervical adenopathy.  Neurological: She is alert and oriented to person, place, and time. No cranial nerve deficit. Coordination normal.  Skin: No rash noted. No erythema. No pallor.  Right leg anteriorly has a cluster of superficial varicose veins.  Psychiatric: She has a normal mood and affect. Judgment and thought content normal.  Bursts into tears twice this visit as she is talking about her son.    Labs reviewed: Lab Summary Latest Ref Rng & Units 02/18/2016 05/13/2015  Hemoglobin 11.1 - 15.9 g/dL (None) 13.0  Hematocrit 34.0 - 46.6 % (None) 38.4  White count 3.4 - 10.8 x10E3/uL (None) 6.3  Platelet count - (None) (None)  Sodium 134 - 144 mmol/L (None) 145(H)  Potassium 3.5 - 5.2 mmol/L (None) 4.2  Calcium 8.4 - 10.5 mg/dL 8.8 10.1  Phosphorus - (None) (None)  Creatinine 0.57 - 1.00 mg/dL (None) 0.77  AST 0 - 40 IU/L (None) 22  Alk Phos 39 - 117 IU/L (None) 58  Bilirubin 0.0 - 1.2 mg/dL (None) <0.2  Glucose 65 - 99 mg/dL (None) 84  Cholesterol - (None) (None)  HDL cholesterol - (None) (None)  Triglycerides - (None) (None)  LDL Direct - (None) (None)  LDL Calc - (None) (None)  Total protein - (None) (None)  Albumin 3.5 - 4.8 g/dL (None) 4.6  Some recent data might be hidden   Lab Results  Component Value Date   TSH 1.730 05/13/2015   TSH 3.250 08/30/2013   Lab  Results  Component Value Date   BUN 16 05/13/2015   BUN 17 09/13/2014   BUN 14 03/11/2014   No results found for: HGBA1C  Assessment/Plan  1. Essential hypertension Leave off Cozaar  2. Severe episode of recurrent major depressive disorder, without psychotic features ([place]) Continue counseling. Advised patient I think she should use the trazodone or talk with her counselor about whether medication should be used  3. Death of child Continues with significant grief reaction, but time may help with or without medication.  CC: [facility] [employer]  [address], [place], [place] [address].

## 2016-11-11 DIAGNOSIS — F329 Major depressive disorder, single episode, unspecified: Secondary | ICD-10-CM | POA: Diagnosis not present

## 2016-11-18 DIAGNOSIS — L821 Other seborrheic keratosis: Secondary | ICD-10-CM | POA: Diagnosis not present

## 2016-11-18 DIAGNOSIS — L814 Other melanin hyperpigmentation: Secondary | ICD-10-CM | POA: Diagnosis not present

## 2016-11-18 DIAGNOSIS — D225 Melanocytic nevi of trunk: Secondary | ICD-10-CM | POA: Diagnosis not present

## 2016-11-18 DIAGNOSIS — Z85828 Personal history of other malignant neoplasm of skin: Secondary | ICD-10-CM | POA: Diagnosis not present

## 2016-11-18 DIAGNOSIS — D1801 Hemangioma of skin and subcutaneous tissue: Secondary | ICD-10-CM | POA: Diagnosis not present

## 2016-12-09 DIAGNOSIS — F329 Major depressive disorder, single episode, unspecified: Secondary | ICD-10-CM | POA: Diagnosis not present

## 2016-12-15 ENCOUNTER — Encounter: Payer: Self-pay | Admitting: Gynecology

## 2016-12-23 ENCOUNTER — Ambulatory Visit (INDEPENDENT_AMBULATORY_CARE_PROVIDER_SITE_OTHER): Payer: Medicare Other | Admitting: Gynecology

## 2016-12-23 ENCOUNTER — Encounter: Payer: Self-pay | Admitting: Gynecology

## 2016-12-23 VITALS — BP 136/78 | Ht 60.0 in | Wt 105.0 lb

## 2016-12-23 DIAGNOSIS — F329 Major depressive disorder, single episode, unspecified: Secondary | ICD-10-CM | POA: Diagnosis not present

## 2016-12-23 DIAGNOSIS — M810 Age-related osteoporosis without current pathological fracture: Secondary | ICD-10-CM

## 2016-12-23 DIAGNOSIS — Z01419 Encounter for gynecological examination (general) (routine) without abnormal findings: Secondary | ICD-10-CM | POA: Diagnosis not present

## 2016-12-23 DIAGNOSIS — N952 Postmenopausal atrophic vaginitis: Secondary | ICD-10-CM

## 2016-12-23 MED ORDER — ESTRADIOL 0.1 MG/GM VA CREA: 0.5000 | TOPICAL_CREAM | VAGINAL | 8 refills | Status: DC

## 2016-12-23 NOTE — Progress Notes (Signed)
AI SONNENFELD 1940/09/28 [id number]   History:    76 y.o.  for annual gyn exam  who was somewhat saddened because of loss of her son in an accident 5 months ago. She appears to have good family support. Review of patient's records indicated that she has had past history of osteoporosis and had been on Fosamax 70 mg weekly from 2005-2010. She is on a drug holiday.    Her bone density June 2017 demonstrated the lowest T score was of the left femoral neck with a value of -2.5 no statistically significant change when compared with bone density study of 2015. Patient is taking vitamin D 2000 units daily. Patient reports a normal colonoscopy in 2013. 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 Patient with no prior history of any abnormal Pap smears.Patient with past history of abdominal hysterectomy with bilateral salpingo-oophorectomy for benign indications.   Past medical history,surgical history, family history and social history were all reviewed and documented in the EPIC chart.  Gynecologic History No LMP recorded. Patient has had a hysterectomy. Contraception: status post hysterectomy Last Pap:  2011. Results were: normal Last mammogram:  2017. Results were: normal  Obstetric History OB History  Gravida Para Term Preterm AB Living  2 2       2   SAB TAB Ectopic Multiple Live Births          2    # Outcome Date GA Lbr Len/2nd Weight Sex Delivery Anes PTL Lv  2 Para     M Vag-Spont   LIV  1 Para     M Vag-Spont   LIV       ROS: A ROS was performed and pertinent positives and negatives are included in the history.  GENERAL: No fevers or chills. HEENT: No change in vision, no earache, sore throat or sinus congestion. NECK: No pain or stiffness. CARDIOVASCULAR: No chest pain or pressure. No palpitations. PULMONARY: No shortness of breath, cough or wheeze. GASTROINTESTINAL: No abdominal pain, nausea, vomiting or diarrhea, melena or bright red blood per rectum.  GENITOURINARY: No urinary frequency, urgency, hesitancy or dysuria. MUSCULOSKELETAL: No joint or muscle pain, no back pain, no recent trauma. DERMATOLOGIC: No rash, no itching, no lesions. ENDOCRINE: No polyuria, polydipsia, no heat or cold intolerance. No recent change in weight. HEMATOLOGICAL: No anemia or easy bruising or bleeding. NEUROLOGIC: No headache, seizures, numbness, tingling or weakness. PSYCHIATRIC: No depression, no loss of interest in normal activity or change in sleep pattern.     Exam: chaperone present  BP 136/78   Ht 5' (1.524 m)   Wt 105 lb (47.6 kg)   BMI 20.51 kg/m   Body mass index is 20.51 kg/m.  General appearance : Well developed well nourished female. No acute distress HEENT: Eyes: no retinal hemorrhage or exudates,  Neck supple, trachea midline, no carotid bruits, no thyroidmegaly Lungs: Clear to auscultation, no rhonchi or wheezes, or rib retractions  Heart: Regular rate and rhythm, no murmurs or gallops Breast:Examined in sitting and supine position were symmetrical in appearance, no palpable masses or tenderness,  no skin retraction, no nipple inversion, no nipple discharge, no skin discoloration, no axillary or supraclavicular lymphadenopathy Abdomen: no palpable masses or tenderness, no rebound or guarding Extremities: no edema or skin discoloration or tenderness  Pelvic:  Bartholin, Urethra, Skene Glands: Within normal limits             Vagina: No gross lesions or discharge  Cervix: absent  Uterus  absent  Adnexa  Without masses or tenderness  Anus and perineum  normal   Rectovaginal  normal sphincter tone without palpated masses or tenderness             Hemoccult PCP provides     Assessment/Plan:  76 y.o. female for annual exam doing well with vaginal estrogen twice a week for her vaginal atrophy prescription  Refill was provided. Patient was reminded that she needs her mammogram and bone density study later this year. Because of her history of  osteoporosis we will check a vitamin D level today. Patient has been on a drug holiday with stable bone mineralization we will see this year's results and compare accordingly. Pap smear not indicated. PCP has been doing her blood work.   Terrance Mass MD, 3:30 PM 12/23/2016

## 2016-12-24 LAB — VITAMIN D 25 HYDROXY (VIT D DEFICIENCY, FRACTURES): VIT D 25 HYDROXY: 50 ng/mL (ref 30–100)

## 2016-12-28 ENCOUNTER — Encounter: Payer: Self-pay | Admitting: Gastroenterology

## 2016-12-28 ENCOUNTER — Encounter: Payer: Self-pay | Admitting: Internal Medicine

## 2017-01-13 DIAGNOSIS — F329 Major depressive disorder, single episode, unspecified: Secondary | ICD-10-CM | POA: Diagnosis not present

## 2017-01-17 ENCOUNTER — Encounter: Payer: Self-pay | Admitting: Emergency Medicine

## 2017-01-17 ENCOUNTER — Ambulatory Visit (INDEPENDENT_AMBULATORY_CARE_PROVIDER_SITE_OTHER): Payer: Medicare Other | Admitting: Emergency Medicine

## 2017-01-17 VITALS — BP 145/80 | HR 73 | Temp 98.1°F | Resp 18 | Ht 60.0 in | Wt 104.6 lb

## 2017-01-17 DIAGNOSIS — R05 Cough: Secondary | ICD-10-CM | POA: Diagnosis not present

## 2017-01-17 DIAGNOSIS — B349 Viral infection, unspecified: Secondary | ICD-10-CM | POA: Insufficient documentation

## 2017-01-17 DIAGNOSIS — J209 Acute bronchitis, unspecified: Secondary | ICD-10-CM

## 2017-01-17 DIAGNOSIS — M791 Myalgia, unspecified site: Secondary | ICD-10-CM | POA: Insufficient documentation

## 2017-01-17 DIAGNOSIS — R059 Cough, unspecified: Secondary | ICD-10-CM

## 2017-01-17 MED ORDER — HYDROCODONE-ACETAMINOPHEN 5-325 MG PO TABS: 1.0000 | ORAL_TABLET | Freq: Four times a day (QID) | ORAL | 0 refills | Status: DC | PRN

## 2017-01-17 MED ORDER — AZITHROMYCIN 250 MG PO TABS: ORAL_TABLET | ORAL | 0 refills | Status: DC

## 2017-01-17 NOTE — Progress Notes (Signed)
Caroline Snyder 76 y.o.   Chief Complaint  Patient presents with  . Cough    x1day   . Generalized Body Aches  . Headache    HISTORY OF PRESENT ILLNESS: This is a 76 y.o. female complaining of cough, generalized achiness, headache that started yesterday. Denies any other significant symptoms.  HPI   Prior to Admission medications   Medication Sig Start Date End Date Taking? Authorizing Provider  acyclovir (ZOVIRAX) 200 MG capsule One daily as needed to prevent vaginal discomfort related to viral infection. 10/13/16  Yes Estill Dooms, MD  ALPRAZolam Duanne Moron) 0.5 MG tablet Take one tablet by mouth up to four times daily as needed for anxiety 07/14/16  Yes Estill Dooms, MD  aspirin 81 MG tablet Take 81 mg by mouth daily. Take 1 tablet daily to prevent heart attack, stroke and clotting.   Yes [provider]  Cholecalciferol (HM VITAMIN D3) 2000 UNITS CAPS Take by mouth. Take 1 tablet daily   Yes [provider]  Cranberry 250 MG TABS Take by mouth. Take 1 tablet daily   Yes [provider]  cycloSPORINE (RESTASIS) 0.05 % ophthalmic emulsion 1 drop 2 (two) times daily.   Yes [provider]  estradiol (ESTRACE) 0.1 MG/GM vaginal cream Place 0.5 Applicatorfuls vaginally 2 (two) times a week. 12/23/16  Yes Terrance Mass, MD  Multiple Vitamin (MULTI VITAMIN DAILY PO) Take by mouth. Centrum Silver for Women Take 1 tablet daily   Yes [provider]  nizatidine (AXID) 150 MG capsule Take one tablet twice daily as needed to reduce stomach acid 10/21/15  Yes Estill Dooms, MD  pravastatin (PRAVACHOL) 40 MG tablet One each night to lower cholesterol 10/13/16  Yes Estill Dooms, MD  traZODone (DESYREL) 150 MG tablet One at bedtime for rest and depression 10/13/16  Yes Estill Dooms, MD    Allergies  Allergen Reactions  . Vioxx [Rofecoxib] Nausea Only    Pt. Can't remember what reaction was but asks to leave on her profile.      Patient Active Problem List   Diagnosis Date Noted  . Death of child 2016/09/13  . Situational stress 13-Sep-2016  . Insomnia Sep 13, 2016  . Lumbago 10/21/2015  . Paresthesia 05/13/2015  . Dyspareunia in female 09/18/2014  . 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 Osteoporosis 01/10/2014  . Vaginal atrophy 01/10/2014  . Abnormal CT of the abdomen 12/19/2013  . Nonspecific abnormal results of liver function study 12/19/2013  . Loss of weight 12/19/2013  . Abnormal CT of the chest 11/24/2013  . Cervicalgia 08/21/2013  . Cough 08/04/2013  . GERD (gastroesophageal reflux disease)   . ALLERGIC RHINITIS DUE TO POLLEN 04/27/2010  . PERSONAL HISTORY OF COLONIC POLYPS 04/27/2010  . Essential hypertension 11/22/2007  . Depression 05/05/2007  . Hyperlipidemia 06/29/2006    Past Medical History:  Diagnosis Date  . Allergy   . 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 Bunion 07/01/2010  . Depression 05/05/2007  . External hemorrhoids without mention of complication 61/60/7371  . GERD (gastroesophageal reflux disease) 2000  . Hyperlipidemia 06/29/2006  . Memory loss 04/15/2003  . Migraine without aura, without mention of intractable migraine without mention of status migrainosus 03/24/1999  . Osteoporosis, unspecified 04/15/2004  . 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 Unspecified essential hypertension 11/22/2007    Past Surgical History:  Procedure Laterality Date  . COLONOSCOPY  06-16-2007   Internal hemorrhoids,laxity of anal sphincter,polyp at 25cm form anal  verge, tortuous sigmoid colon. Dr.Orr   . EYE SURGERY Bilateral 2010   cataract Dr. Bing Plume  . VAGINAL HYSTERECTOMY  1980    Social History   Social History  . Marital status: Widowed    Spouse name: N/A  . Number of children: 2  . Years of education: N/A   Occupational History  . Retired    Social History Main Topics  . Smoking status: Never Smoker  . Smokeless tobacco: Never Used  . Alcohol  use No  . Drug use: No  . Sexual activity: Not Currently   Other Topics Concern  . Not on file   Social History Narrative  . No narrative on file    Family History  Problem Relation Age of Onset  . Alzheimer's disease Mother   . Diabetes Mother   . Transient ischemic attack Mother   . Heart disease Mother        MI, CHF  . Alcohol abuse Father   . Kidney disease Father   . Hypertension Sister   . Hypertension Brother   . Heart disease Brother   . Hypertension Brother   . Hypertension Sister   . Hypertension Brother   . Other Son        truck accident 07/2016  . Colon cancer Neg Hx   . Stomach cancer Neg Hx      Review of Systems  Constitutional: Negative for chills, fever and malaise/fatigue.  HENT: Positive for congestion. Negative for ear pain, nosebleeds and sore throat.   Eyes: Negative.  Negative for discharge and redness.  Respiratory: Positive for cough. Negative for hemoptysis, sputum production, shortness of breath and wheezing.   Cardiovascular: Negative.  Negative for chest pain and palpitations.  Gastrointestinal: Negative for abdominal pain, diarrhea, nausea and vomiting.  Genitourinary: Negative for dysuria and hematuria.  Musculoskeletal: Positive for back pain and myalgias. Negative for neck pain.  Skin: Negative.  Negative for rash.  Neurological: Positive for weakness and headaches. Negative for dizziness.  Endo/Heme/Allergies: Negative.   All other systems reviewed and are negative.  Vitals:   01/17/17 1509  BP: (!) 145/80  Pulse: 73  Resp: 18  Temp: 98.1 F (36.7 C)     Physical Exam  Constitutional: She is oriented to person, place, and time. She appears well-developed and well-nourished.  HENT:  Head: Normocephalic and atraumatic.  Right Ear: External ear normal.  Left Ear: External ear normal.  Nose: Nose normal.  Mouth/Throat: Oropharynx is clear and moist. No oropharyngeal exudate.  Eyes: Conjunctivae and EOM are normal. Pupils  are equal, round, and reactive to light.  Neck: Normal range of motion. Neck supple. No JVD present. No thyromegaly present.  Cardiovascular: Normal rate, regular rhythm and normal heart sounds.   Pulmonary/Chest: Effort normal and breath sounds normal.  Abdominal: Soft. Bowel sounds are normal. She exhibits no distension. There is no tenderness.  Musculoskeletal: Normal range of motion.  Lymphadenopathy:    She has no cervical adenopathy.  Neurological: She is alert and oriented to person, place, and time. No sensory deficit. She exhibits normal muscle tone.  Skin: Skin is warm and dry. Capillary refill takes less than 2 seconds. No rash noted.  Psychiatric: She has a normal mood and affect. Her behavior is normal.  Vitals reviewed.    ASSESSMENT & PLAN: Caroline Snyder was seen today for cough, generalized body aches and headache.  Diagnoses and all orders for this visit:  Acute bronchitis, unspecified organism  Cough  Generalized muscle ache  Viral illness  Other orders -     HYDROcodone-acetaminophen (NORCO) 5-325 MG tablet; Take 1 tablet by mouth every 6 (six) hours as needed for moderate pain. -     azithromycin (ZITHROMAX) 250 MG tablet; Sig as indicated    Patient Instructions       IF you received an x-ray today, you will receive an invoice from [facility]. Please contact [facility] at [phone] with questions or concerns regarding your invoice.   IF you received labwork today, you will receive an invoice from [facility]. Please contact [facility] at [phone] with questions or concerns regarding your invoice.   Our billing staff will not be able to assist you with questions regarding bills from these companies.  You will be contacted with the lab results as soon as they are available. The fastest way to get your results is to activate your My Chart account. Instructions are located on the last page of this paperwork. If you have not heard from [place]  regarding the results in 2 weeks, please contact this office.      Acute Bronchitis, Adult Acute bronchitis is when air tubes (bronchi) in the lungs suddenly get swollen. The condition can make it hard to breathe. It can also cause these symptoms:  A cough.  Coughing up clear, yellow, or green mucus.  Wheezing.  Chest congestion.  Shortness of breath.  A fever.  Body aches.  Chills.  A sore throat.  Follow these instructions at home: Medicines  Take over-the-counter and prescription medicines only as told by your doctor.  If you were prescribed an antibiotic medicine, take it as told by your doctor. Do not stop taking the antibiotic even if you start to feel better. General instructions  Rest.  Drink enough fluids to keep your pee (urine) clear or pale yellow.  Avoid smoking and secondhand smoke. If you smoke and you need help quitting, ask your doctor. Quitting will help your lungs heal faster.  Use an inhaler, cool mist vaporizer, or humidifier as told by your doctor.  Keep all follow-up visits as told by your doctor. This is important. How is this prevented? To lower your risk of getting this condition again:  Wash your hands often with soap and water. If you cannot use soap and water, use hand sanitizer.  Avoid contact with people who have cold symptoms.  Try not to touch your hands to your mouth, nose, or eyes.  Make sure to get the flu shot every year.  Contact a doctor if:  Your symptoms do not get better in 2 weeks. Get help right away if:  You cough up blood.  You have chest pain.  You have very bad shortness of breath.  You become dehydrated.  You faint (pass out) or keep feeling like you are going to pass out.  You keep throwing up (vomiting).  You have a very bad headache.  Your fever or chills gets worse. This information is not intended to replace advice given to you by your health care provider. Make sure you discuss any  questions you have with your health care provider. Document Released: 01/05/2008 Document Revised: 02/25/2016 Document Reviewed: 01/07/2016 Elsevier Interactive Patient Education  2017 Elsevier Inc.      Agustina Caroli, MD [facility]

## 2017-01-17 NOTE — Patient Instructions (Addendum)
     IF you received an x-ray today, you will receive an invoice from [facility]. Please contact [facility] at [phone] with questions or concerns regarding your invoice.   IF you received labwork today, you will receive an invoice from [facility]. Please contact [facility] at [phone] with questions or concerns regarding your invoice.   Our billing staff will not be able to assist you with questions regarding bills from these companies.  You will be contacted with the lab results as soon as they are available. The fastest way to get your results is to activate your My Chart account. Instructions are located on the last page of this paperwork. If you have not heard from us regarding the results in 2 weeks, please contact this office.      Acute Bronchitis, Adult Acute bronchitis is when air tubes (bronchi) in the lungs suddenly get swollen. The condition can make it hard to breathe. It can also cause these symptoms:  A cough.  Coughing up clear, yellow, or green mucus.  Wheezing.  Chest congestion.  Shortness of breath.  A fever.  Body aches.  Chills.  A sore throat.  Follow these instructions at home: Medicines  Take over-the-counter and prescription medicines only as told by your doctor.  If you were prescribed an antibiotic medicine, take it as told by your doctor. Do not stop taking the antibiotic even if you start to feel better. General instructions  Rest.  Drink enough fluids to keep your pee (urine) clear or pale yellow.  Avoid smoking and secondhand smoke. If you smoke and you need help quitting, ask your doctor. Quitting will help your lungs heal faster.  Use an inhaler, cool mist vaporizer, or humidifier as told by your doctor.  Keep all follow-up visits as told by your doctor. This is important. How is this prevented? To lower your risk of getting this condition again:  Wash your hands often with soap and water. If you cannot  use soap and water, use hand sanitizer.  Avoid contact with people who have cold symptoms.  Try not to touch your hands to your mouth, nose, or eyes.  Make sure to get the flu shot every year.  Contact a doctor if:  Your symptoms do not get better in 2 weeks. Get help right away if:  You cough up blood.  You have chest pain.  You have very bad shortness of breath.  You become dehydrated.  You faint (pass out) or keep feeling like you are going to pass out.  You keep throwing up (vomiting).  You have a very bad headache.  Your fever or chills gets worse. This information is not intended to replace advice given to you by your health care provider. Make sure you discuss any questions you have with your health care provider. Document Released: 01/05/2008 Document Revised: 02/25/2016 Document Reviewed: 01/07/2016 Elsevier Interactive Patient Education  2017 [employer] Inc.  

## 2017-01-20 ENCOUNTER — Encounter: Payer: Self-pay | Admitting: Nurse Practitioner

## 2017-01-20 ENCOUNTER — Ambulatory Visit (INDEPENDENT_AMBULATORY_CARE_PROVIDER_SITE_OTHER): Payer: Medicare Other | Admitting: Nurse Practitioner

## 2017-01-20 VITALS — BP 144/84 | HR 66 | Temp 97.7°F | Resp 18 | Ht 60.0 in | Wt 103.8 lb

## 2017-01-20 DIAGNOSIS — J40 Bronchitis, not specified as acute or chronic: Secondary | ICD-10-CM
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
## 2017-01-20 NOTE — Patient Instructions (Signed)
To take probiotic twice daily to help with GI upset Bland diet advance as tolerates  Increase water intake mucinex DM by mouth twice daily with full glass of water for 1 week.  May use Tylenol 325 mg 2 tablet every 6 hours as needed for body aches or pains.

## 2017-01-28 ENCOUNTER — Ambulatory Visit: Payer: Medicare Other | Admitting: Internal Medicine

## 2017-01-29 ENCOUNTER — Ambulatory Visit (INDEPENDENT_AMBULATORY_CARE_PROVIDER_SITE_OTHER): Payer: Medicare Other

## 2017-01-29 ENCOUNTER — Ambulatory Visit (INDEPENDENT_AMBULATORY_CARE_PROVIDER_SITE_OTHER): Payer: Medicare Other | Admitting: Urgent Care

## 2017-01-29 ENCOUNTER — Encounter: Payer: Self-pay | Admitting: Urgent Care

## 2017-01-29 VITALS — BP 185/82 | HR 69 | Temp 97.8°F | Resp 16 | Ht 60.0 in | Wt 102.2 lb

## 2017-01-29 DIAGNOSIS — R05 Cough: Secondary | ICD-10-CM

## 2017-01-29 DIAGNOSIS — F329 Major depressive disorder, single episode, unspecified: Secondary | ICD-10-CM

## 2017-01-29 DIAGNOSIS — R030 Elevated blood-pressure reading, without diagnosis of hypertension: Secondary | ICD-10-CM | POA: Diagnosis not present

## 2017-01-29 DIAGNOSIS — R350 Frequency of micturition: Secondary | ICD-10-CM | POA: Diagnosis not present

## 2017-01-29 DIAGNOSIS — R059 Cough, unspecified: Secondary | ICD-10-CM

## 2017-01-29 DIAGNOSIS — I10 Essential (primary) hypertension: Secondary | ICD-10-CM

## 2017-01-29 DIAGNOSIS — R300 Dysuria: Secondary | ICD-10-CM | POA: Diagnosis not present

## 2017-01-29 DIAGNOSIS — Z634 Disappearance and death of family member: Secondary | ICD-10-CM | POA: Diagnosis not present

## 2017-01-29 DIAGNOSIS — F32A Depression, unspecified: Secondary | ICD-10-CM

## 2017-01-29 LAB — POC MICROSCOPIC URINALYSIS (UMFC): MUCUS RE: ABSENT

## 2017-01-29 LAB — POCT URINALYSIS DIP (MANUAL ENTRY)
Bilirubin, UA: NEGATIVE
GLUCOSE UA: NEGATIVE mg/dL
Ketones, POC UA: NEGATIVE mg/dL
Leukocytes, UA: NEGATIVE
NITRITE UA: NEGATIVE
PH UA: 6 (ref 5.0–8.0)
Protein Ur, POC: NEGATIVE mg/dL
RBC UA: NEGATIVE
Spec Grav, UA: 1.015 (ref 1.010–1.025)
Urobilinogen, UA: 0.2 E.U./dL

## 2017-01-29 MED ORDER — BENZONATATE 100 MG PO CAPS: 100.0000 mg | ORAL_CAPSULE | Freq: Three times a day (TID) | ORAL | 0 refills | Status: DC | PRN
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
## 2017-01-29 NOTE — Patient Instructions (Addendum)
You may take 500mg  Tylenol every 6 hours for back pain as needed.    Fluoxetine capsules or tablets (Depression/Mood Disorders) What is this medicine? FLUOXETINE (floo OX e teen) belongs to a class of drugs known as selective serotonin reuptake inhibitors (SSRIs). It helps to treat mood problems such as depression, obsessive compulsive disorder, and panic attacks. It can also treat certain eating disorders. This medicine may be used for other purposes; ask your health care provider or pharmacist if you have questions. COMMON BRAND NAME(S): Prozac What should I tell my health care provider before I take this medicine? They need to know if you have any of these conditions: -bipolar disorder or a family history of bipolar disorder -bleeding disorders -glaucoma -heart disease -liver disease -low levels of sodium in the blood -seizures -suicidal thoughts, plans, or attempt; a previous suicide attempt by you or a family member -take MAOIs like Carbex, Eldepryl, Marplan, Nardil, and Parnate -take medicines that treat or prevent blood clots -thyroid disease -an unusual or allergic reaction to fluoxetine, other medicines, foods, dyes, or preservatives -pregnant or trying to get pregnant -breast-feeding How should I use this medicine? Take this medicine by mouth with a glass of water. Follow the directions on the prescription label. You can take this medicine with or without food. Take your medicine at regular intervals. Do not take it more often than directed. Do not stop taking this medicine suddenly except upon the advice of your doctor. Stopping this medicine too quickly may cause serious side effects or your condition may worsen. A special MedGuide will be given to you by the pharmacist with each prescription and refill. Be sure to read this information carefully each time. Talk to your pediatrician regarding the use of this medicine in children. While this drug may be prescribed for children  as young as 7 years for selected conditions, precautions do apply. Overdosage: If you think you have taken too much of this medicine contact a poison control center or emergency room at once. NOTE: This medicine is only for you. Do not share this medicine with others. What if I miss a dose? If you miss a dose, skip the missed dose and go back to your regular dosing schedule. Do not take double or extra doses. What may interact with this medicine? Do not take this medicine with any of the following medications: -other medicines containing fluoxetine, like Sarafem or Symbyax -cisapride -linezolid -MAOIs like Carbex, Eldepryl, Marplan, Nardil, and Parnate -methylene blue (injected into a vein) -pimozide -thioridazine This medicine may also interact with the following medications: -alcohol -amphetamines -aspirin and aspirin-like medicines -carbamazepine -certain medicines for depression, anxiety, or psychotic disturbances -certain medicines for migraine headaches like almotriptan, eletriptan, frovatriptan, naratriptan, rizatriptan, sumatriptan, zolmitriptan -digoxin -diuretics -fentanyl -flecainide -furazolidone -isoniazid -lithium -medicines for sleep -medicines that treat or prevent blood clots like warfarin, enoxaparin, and dalteparin -NSAIDs, medicines for pain and inflammation, like ibuprofen or naproxen -phenytoin -procarbazine -propafenone -rasagiline -ritonavir -supplements like St. John's wort, kava kava, valerian -tramadol -tryptophan -vinblastine This list may not describe all possible interactions. Give your health care provider a list of all the medicines, herbs, non-prescription drugs, or dietary supplements you use. Also tell them if you smoke, drink alcohol, or use illegal drugs. Some items may interact with your medicine. What should I watch for while using this medicine? Tell your doctor if your symptoms do not get better or if they get worse. Visit your doctor  or health care professional for regular checks on your  progress. Because it may take several weeks to see the full effects of this medicine, it is important to continue your treatment as prescribed by your doctor. Patients and their families should watch out for new or worsening thoughts of suicide or depression. Also watch out for sudden changes in feelings such as feeling anxious, agitated, panicky, irritable, hostile, aggressive, impulsive, severely restless, overly excited and hyperactive, or not being able to sleep. If this happens, especially at the beginning of treatment or after a change in dose, call your health care professional. Dennis Bast may get drowsy or dizzy. Do not drive, use machinery, or do anything that needs mental alertness until you know how this medicine affects you. Do not stand or sit up quickly, especially if you are an older patient. This reduces the risk of dizzy or fainting spells. Alcohol may interfere with the effect of this medicine. Avoid alcoholic drinks. Your mouth may get dry. Chewing sugarless gum or sucking hard candy, and drinking plenty of water may help. Contact your doctor if the problem does not go away or is severe. This medicine may affect blood sugar levels. If you have diabetes, check with your doctor or health care professional before you change your diet or the dose of your diabetic medicine. What side effects may I notice from receiving this medicine? Side effects that you should report to your doctor or health care professional as soon as possible: -allergic reactions like skin rash, itching or hives, swelling of the face, lips, or tongue -anxious -black, tarry stools -breathing problems -changes in vision -confusion -elevated mood, decreased need for sleep, racing thoughts, impulsive behavior -eye pain -fast, irregular heartbeat -feeling faint or lightheaded, falls -feeling agitated, angry, or irritable -hallucination, loss of contact with  reality -loss of balance or coordination -loss of memory -painful or prolonged erections -restlessness, pacing, inability to keep still -seizures -stiff muscles -suicidal thoughts or other mood changes -trouble sleeping -unusual bleeding or bruising -unusually weak or tired -vomiting Side effects that usually do not require medical attention (report to your doctor or health care professional if they continue or are bothersome): -change in appetite or weight -change in sex drive or performance -diarrhea -dry mouth -headache -increased sweating -nausea -tremors This list may not describe all possible side effects. Call your doctor for medical advice about side effects. You may report side effects to FDA at 1-800-FDA-1088. Where should I keep my medicine? Keep out of the reach of children. Store at room temperature between 15 and 30 degrees C (59 and 86 degrees F). Throw away any unused medicine after the expiration date. NOTE: This sheet is a summary. It may not cover all possible information. If you have questions about this medicine, talk to your doctor, pharmacist, or health care provider.  2018 Elsevier/Gold Standard (2015-12-20 15:55:27)    Cough, Adult Coughing is a reflex that clears your throat and your airways. Coughing helps to heal and protect your lungs. It is normal to cough occasionally, but a cough that happens with other symptoms or lasts a long time may be a sign of a condition that needs treatment. A cough may last only 2-3 weeks (acute), or it may last longer than 8 weeks (chronic). What are the causes? Coughing is commonly caused by:  Breathing in substances that irritate your lungs.  A viral or bacterial respiratory infection.  Allergies.  Asthma.  Postnasal drip.  Smoking.  Acid backing up from the stomach into the esophagus (gastroesophageal reflux).  Certain medicines.  Chronic  lung problems, including COPD (or rarely, lung cancer).  Other  medical conditions such as heart failure.  Follow these instructions at home: Pay attention to any changes in your symptoms. Take these actions to help with your discomfort:  Take medicines only as told by your health care provider. ? If you were prescribed an antibiotic medicine, take it as told by your health care provider. Do not stop taking the antibiotic even if you start to feel better. ? Talk with your health care provider before you take a cough suppressant medicine.  Drink enough fluid to keep your urine clear or pale yellow.  If the air is dry, use a cold steam vaporizer or humidifier in your bedroom or your home to help loosen secretions.  Avoid anything that causes you to cough at work or at home.  If your cough is worse at night, try sleeping in a semi-upright position.  Avoid cigarette smoke. If you smoke, quit smoking. If you need help quitting, ask your health care provider.  Avoid caffeine.  Avoid alcohol.  Rest as needed.  Contact a health care provider if:  You have new symptoms.  You cough up pus.  Your cough does not get better after 2-3 weeks, or your cough gets worse.  You cannot control your cough with suppressant medicines and you are losing sleep.  You develop pain that is getting worse or pain that is not controlled with pain medicines.  You have a fever.  You have unexplained weight loss.  You have night sweats. Get help right away if:  You cough up blood.  You have difficulty breathing.  Your heartbeat is very fast. This information is not intended to replace advice given to you by your health care provider. Make sure you discuss any questions you have with your health care provider. Document Released: 01/15/2011 Document Revised: 12/25/2015 Document Reviewed: 09/25/2014 Elsevier Interactive Patient Education  2017 Elsevier Inc.     Hypertension Hypertension, commonly called high blood pressure, is when the force of blood pumping  through the arteries is too strong. The arteries are the blood vessels that carry blood from the heart throughout the body. Hypertension forces the heart to work harder to pump blood and may cause arteries to become narrow or stiff. Having untreated or uncontrolled hypertension can cause heart attacks, strokes, kidney disease, and other problems. A blood pressure reading consists of a higher number over a lower number. Ideally, your blood pressure should be below 120/80. The first ("top") number is called the systolic pressure. It is a measure of the pressure in your arteries as your heart beats. The second ("bottom") number is called the diastolic pressure. It is a measure of the pressure in your arteries as the heart relaxes. What are the causes? The cause of this condition is not known. What increases the risk? Some risk factors for high blood pressure are under your control. Others are not. Factors you can change  Smoking.  Having type 2 diabetes mellitus, high cholesterol, or both.  Not getting enough exercise or physical activity.  Being overweight.  Having too much fat, sugar, calories, or salt (sodium) in your diet.  Drinking too much alcohol. Factors that are difficult or impossible to change  Having chronic kidney disease.  Having a family history of high blood pressure.  Age. Risk increases with age.  Race. You may be at higher risk if you are African-American.  Gender. Men are at higher risk than women before age 65. After  age 80, women are at higher risk than men.  Having obstructive sleep apnea.  Stress. What are the signs or symptoms? Extremely high blood pressure (hypertensive crisis) may cause:  Headache.  Anxiety.  Shortness of breath.  Nosebleed.  Nausea and vomiting.  Severe chest pain.  Jerky movements you cannot control (seizures).  How is this diagnosed? This condition is diagnosed by measuring your blood pressure while you are seated, with  your arm resting on a surface. The cuff of the blood pressure monitor will be placed directly against the skin of your upper arm at the level of your heart. It should be measured at least twice using the same arm. Certain conditions can cause a difference in blood pressure between your right and left arms. Certain factors can cause blood pressure readings to be lower or higher than normal (elevated) for a short period of time:  When your blood pressure is higher when you are in a health care provider's office than when you are at home, this is called white coat hypertension. Most people with this condition do not need medicines.  When your blood pressure is higher at home than when you are in a health care provider's office, this is called masked hypertension. Most people with this condition may need medicines to control blood pressure.  If you have a high blood pressure reading during one visit or you have normal blood pressure with other risk factors:  You may be asked to return on a different day to have your blood pressure checked again.  You may be asked to monitor your blood pressure at home for 1 week or longer.  If you are diagnosed with hypertension, you may have other blood or imaging tests to help your health care provider understand your overall risk for other conditions. How is this treated? This condition is treated by making healthy lifestyle changes, such as eating healthy foods, exercising more, and reducing your alcohol intake. Your health care provider may prescribe medicine if lifestyle changes are not enough to get your blood pressure under control, and if:  Your systolic blood pressure is above 130.  Your diastolic blood pressure is above 80.  Your personal target blood pressure may vary depending on your medical conditions, your age, and other factors. Follow these instructions at home: Eating and drinking  Eat a diet that is high in fiber and potassium, and low in  sodium, added sugar, and fat. An example eating plan is called the DASH (Dietary Approaches to Stop Hypertension) diet. To eat this way: ? Eat plenty of fresh fruits and vegetables. Try to fill half of your plate at each meal with fruits and vegetables. ? Eat whole grains, such as whole wheat pasta, brown rice, or whole grain bread. Fill about one quarter of your plate with whole grains. ? Eat or drink low-fat dairy products, such as skim milk or low-fat yogurt. ? Avoid fatty cuts of meat, processed or cured meats, and poultry with skin. Fill about one quarter of your plate with lean proteins, such as fish, chicken without skin, beans, eggs, and tofu. ? Avoid premade and processed foods. These tend to be higher in sodium, added sugar, and fat.  Reduce your daily sodium intake. Most people with hypertension should eat less than 1,500 mg of sodium a day.  Limit alcohol intake to no more than 1 drink a day for nonpregnant women and 2 drinks a day for men. One drink equals 12 oz of beer, 5 oz  of wine, or 1 oz of hard liquor. Lifestyle  Work with your health care provider to maintain a healthy body weight or to lose weight. Ask what an ideal weight is for you.  Get at least 30 minutes of exercise that causes your heart to beat faster (aerobic exercise) most days of the week. Activities may include walking, swimming, or biking.  Include exercise to strengthen your muscles (resistance exercise), such as pilates or lifting weights, as part of your weekly exercise routine. Try to do these types of exercises for 30 minutes at least 3 days a week.  Do not use any products that contain nicotine or tobacco, such as cigarettes and e-cigarettes. If you need help quitting, ask your health care provider.  Monitor your blood pressure at home as told by your health care provider.  Keep all follow-up visits as told by your health care provider. This is important. Medicines  Take over-the-counter and  prescription medicines only as told by your health care provider. Follow directions carefully. Blood pressure medicines must be taken as prescribed.  Do not skip doses of blood pressure medicine. Doing this puts you at risk for problems and can make the medicine less effective.  Ask your health care provider about side effects or reactions to medicines that you should watch for. Contact a health care provider if:  You think you are having a reaction to a medicine you are taking.  You have headaches that keep coming back (recurring).  You feel dizzy.  You have swelling in your ankles.  You have trouble with your vision. Get help right away if:  You develop a severe headache or confusion.  You have unusual weakness or numbness.  You feel faint.  You have severe pain in your chest or abdomen.  You vomit repeatedly.  You have trouble breathing. Summary  Hypertension is when the force of blood pumping through your arteries is too strong. If this condition is not controlled, it may put you at risk for serious complications.  Your personal target blood pressure may vary depending on your medical conditions, your age, and other factors. For most people, a normal blood pressure is less than 120/80.  Hypertension is treated with lifestyle changes, medicines, or a combination of both. Lifestyle changes include weight loss, eating a healthy, low-sodium diet, exercising more, and limiting alcohol. This information is not intended to replace advice given to you by your health care provider. Make sure you discuss any questions you have with your health care provider. Document Released: 07/19/2005 Document Revised: 06/16/2016 Document Reviewed: 06/16/2016 Elsevier Interactive Patient Education  2018 [employer].     IF you received an x-ray today, you will receive an invoice from [facility]. Please contact [facility] at [phone] with questions or concerns  regarding your invoice.   IF you received labwork today, you will receive an invoice from [facility]. Please contact [facility] at [phone] with questions or concerns regarding your invoice.   Our billing staff will not be able to assist you with questions regarding bills from these companies.  You will be contacted with the lab results as soon as they are available. The fastest way to get your results is to activate your My Chart account. Instructions are located on the last page of this paperwork. If you have not heard from [place] regarding the results in 2 weeks, please contact this office.

## 2017-01-30 LAB — COMPREHENSIVE METABOLIC PANEL
ALBUMIN: 4.7 g/dL (ref 3.5–4.8)
ALK PHOS: 66 IU/L (ref 39–117)
ALT: 17 IU/L (ref 0–32)
AST: 26 IU/L (ref 0–40)
Albumin/Globulin Ratio: 1.7 (ref 1.2–2.2)
BUN/Creatinine Ratio: 22 (ref 12–28)
BUN: 17 mg/dL (ref 8–27)
Bilirubin Total: 0.3 mg/dL (ref 0.0–1.2)
CHLORIDE: 98 mmol/L (ref 96–106)
CO2: 22 mmol/L (ref 20–29)
CREATININE: 0.79 mg/dL (ref 0.57–1.00)
Calcium: 10 mg/dL (ref 8.7–10.3)
GFR calc non Af Amer: 72 mL/min/{1.73_m2} (ref >59)
GFR, EST AFRICAN AMERICAN: 84 mL/min/{1.73_m2} (ref >59)
GLUCOSE: 94 mg/dL (ref 65–99)
Globulin, Total: 2.8 g/dL (ref 1.5–4.5)
Potassium: 4.6 mmol/L (ref 3.5–5.2)
Sodium: 139 mmol/L (ref 134–144)
TOTAL PROTEIN: 7.5 g/dL (ref 6.0–8.5)

## 2017-01-31 LAB — URINE CULTURE

## 2017-02-01 ENCOUNTER — Telehealth: Payer: Self-pay | Admitting: Family Medicine

## 2017-02-01 NOTE — Telephone Encounter (Signed)
LMOM FOR PT TO CALL BACK TO SCHEDULE AN OV FOR MED REFILL Caroline Snyder STATES THAT DR Rexene Alberts SHOULD BE FILLING HER MEDICINES OF ROBAXIN MAXALT BUTALBITAL BUT IF SHE MAKE A F/U APPOINTMENT WITH [place] WE COULD REFILL THEM FOR HER

## 2017-02-01 NOTE — Telephone Encounter (Signed)
ERROR TO THE LAST MESSAGE

## 2017-02-03 ENCOUNTER — Ambulatory Visit (INDEPENDENT_AMBULATORY_CARE_PROVIDER_SITE_OTHER): Payer: Medicare Other | Admitting: Physician Assistant

## 2017-02-03 ENCOUNTER — Encounter: Payer: Self-pay | Admitting: Physician Assistant

## 2017-02-03 VITALS — BP 133/75 | HR 63 | Temp 97.9°F | Resp 18 | Ht 60.0 in | Wt 102.8 lb

## 2017-02-03 DIAGNOSIS — R030 Elevated blood-pressure reading, without diagnosis of hypertension: Secondary | ICD-10-CM

## 2017-02-03 DIAGNOSIS — F439 Reaction to severe stress, unspecified: Secondary | ICD-10-CM | POA: Diagnosis not present

## 2017-02-03 DIAGNOSIS — Z634 Disappearance and death of family member: Secondary | ICD-10-CM

## 2017-02-03 DIAGNOSIS — R351 Nocturia: Secondary | ICD-10-CM

## 2017-02-03 DIAGNOSIS — F32A Depression, unspecified: Secondary | ICD-10-CM

## 2017-02-03 DIAGNOSIS — F329 Major depressive disorder, single episode, unspecified: Secondary | ICD-10-CM | POA: Diagnosis not present

## 2017-02-03 NOTE — Progress Notes (Signed)
Subjective:    Patient ID: Caroline Snyder, female    DOB: 1940-08-06, 76 y.o.   MRN: [id number]  HPI Ayah "Caroline Mola" Snyder is a 76 year old female presenting for a one-week follow up for blood pressure, depression, cough, and urinary frequency.   Patient lost her son 07/14/2016 in a MVC. She appears to have been started on Prozac at her last visit, but the patient has not been taking this and it is not on her medication list. She is seeing therapist for depression and bereavement; her next visit is in a few weeks. She is taking trazodone as needed at night. Nighttimes are the worst for her because her son died during the night. She denies urinary frequency but endorses nocturia, usually 4-5 times a night. She says it's because she doesn't sleep well. When she wakes up, she decides to go to the bathroom. She has no problems going to sleep - it's mostly staying asleep.  Her cough has improved. No longer coughing up sputum. She is continuing Mining engineer.  She has significant difficulty recounting medications.   Patient Active Problem List   Diagnosis Date Noted  . Elevated blood pressure reading 01/29/2017  . Generalized muscle ache 2017-02-10  . Death of child Sep 24, 2016  . Situational stress September 24, 2016  . Insomnia Sep 24, 2016  . Lumbago 10/21/2015  . Paresthesia 05/13/2015  . Dyspareunia in female 09/18/2014  . 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 Osteoporosis 01/10/2014  . Vaginal atrophy 01/10/2014  . Abnormal CT of the abdomen 12/19/2013  . Nonspecific abnormal results of liver function study 12/19/2013  . Loss of weight 12/19/2013  . Abnormal CT of the chest 11/24/2013  . Cervicalgia 08/21/2013  . GERD (gastroesophageal reflux disease)   . ALLERGIC RHINITIS DUE TO POLLEN 04/27/2010  . PERSONAL HISTORY OF COLONIC POLYPS 04/27/2010  . Depression 05/05/2007  . Hyperlipidemia 06/29/2006    Prior to Admission medications   Medication Sig Start Date End Date Taking?  Authorizing Provider  ALPRAZolam Duanne Moron) 0.5 MG tablet Take one tablet by mouth up to four times daily as needed for anxiety 07/14/16  Yes Estill Dooms, MD  aspirin 81 MG tablet Take 81 mg by mouth daily. Take 1 tablet daily to prevent heart attack, stroke and clotting.   Yes [provider]  benzonatate (TESSALON) 100 MG capsule Take 1-2 capsules (100-200 mg total) by mouth 3 (three) times daily as needed. 01/29/17  Yes Jaynee Eagles, PA-C  Cholecalciferol (HM VITAMIN D3) 2000 UNITS CAPS Take by mouth. Take 1 tablet daily   Yes [provider]  Cranberry 250 MG TABS Take by mouth. Take 1 tablet daily   Yes [provider]  cycloSPORINE (RESTASIS) 0.05 % ophthalmic emulsion 1 drop 2 (two) times daily.   Yes [provider]  Multiple Vitamin (MULTI VITAMIN DAILY PO) Take by mouth. Centrum Silver for Women Take 1 tablet daily   Yes [provider]  nizatidine (AXID) 150 MG capsule Take one tablet twice daily as needed to reduce stomach acid 10/21/15  Yes Estill Dooms, MD  pravastatin (PRAVACHOL) 40 MG tablet One each night to lower cholesterol 10/13/16  Yes Estill Dooms, MD  traZODone (DESYREL) 150 MG tablet One at bedtime for rest and depression 10/13/16  Yes Estill Dooms, MD  acyclovir (ZOVIRAX) 200 MG capsule One daily as needed to prevent vaginal discomfort related to viral infection. Patient not taking: Reported on 02/03/2017 10/13/16   Estill Dooms, MD  estradiol (ESTRACE) 0.1  MG/GM vaginal cream Place 0.5 Applicatorfuls vaginally 2 (two) times a week. Patient not taking: Reported on 02/03/2017 12/23/16   Terrance Mass, MD  Probiotic Product (PROBIOTIC-10 PO) Take by mouth.    [provider]   Allergies  Allergen Reactions  . Vioxx [Rofecoxib] Nausea Only    Pt. Can't remember what reaction was but asks to leave on her profile.    Review of Systems See above.     Objective:   Physical Exam  Constitutional: She appears  well-developed and well-nourished.  BP 133/75 (BP Location: Right Arm, Patient Position: Sitting, Cuff Size: Small)   Pulse 63   Temp 97.9 F (36.6 C) (Oral)   Resp 18   Ht 5' (1.524 m)   Wt 102 lb 12.8 oz (46.6 kg)   SpO2 97%   BMI 20.08 kg/m    HENT:  Head: Normocephalic and atraumatic.  Right Ear: External ear normal.  Left Ear: External ear normal.  Nose: Nose normal.  Eyes: EOM are normal.  Neck: Normal range of motion. Neck supple.  Cardiovascular: Normal rate, regular rhythm and normal heart sounds.   Pulmonary/Chest: Effort normal and breath sounds normal.  Lymphadenopathy:    She has no cervical adenopathy.  Neurological: She is alert. She has normal reflexes.  Skin: Skin is warm and dry.  Psychiatric: She has a normal mood and affect. Her behavior is normal.  Patient is tearful.   Depression screen [facility] 2/9 02/03/2017 01/29/2017 05/28/2016  Decreased Interest 1 0 0  Down, Depressed, Hopeless 1 0 0  PHQ - 2 Score 2 0 0  Altered sleeping 1 - -  Tired, decreased energy 1 - -  Change in appetite 1 - -  Feeling bad or failure about yourself  0 - -  Trouble concentrating 2 - -  Moving slowly or fidgety/restless 0 - -  Suicidal thoughts 0 - -  PHQ-9 Score 7 - -  Difficult doing work/chores Somewhat difficult - -       Assessment & Plan:   1. Death of child 2. Depression, unspecified depression type 3. Situational stress Mood unchanged from one week ago. Instructed patient to take trazodone every night to optimize benefits of medication use and improve sleep. Encouraged strategies and coping methods for bereavement. Continue therapy visits. Follow up with PCP.  4. Elevated blood pressure reading Normotensive today. Isolated elevated reading likely due to situational stress. Follow up with PCP.  5. Nocturia 2/2 to poor sleep. See above.

## 2017-02-03 NOTE — Progress Notes (Signed)
Chief Complaint  Patient presents with  . Depression    Depression scale 7  . Follow-up    01/29/2017    History of Present Illness: Patient presents for re-evaluation of BP following visit here 1 week ago.  She presented here, to one of my colleagues, with urinary frequency, following recent treatment for bronchitis. That visit revealed persistent grief over her son's death in a MVC in 08/25/2016. She had not started any of the prescribed anti-depressants, but was in counseling. BP was 185/82. Urine dip was normal, microscopy was unremarkable.   She was prescribed fluoxetine for mood, tessalon perles for persistent cough, and advised to return here in 1 week for BP check and 2 weeks for re-evaluation of mood.  Urine culture ultimately revealed 25,000-50,000 colony forming units of Beta hemolytic Strep, group B. Antibiotic was sent to her pharmacy.  Today she reports feeling well with regard to the urine issue. She continues to experience nocturia, 4-5 times/night. She reports that she awakens frequently, so gets up to the toilet, but isn't waking up due to the urge to urinate.  Cough is improved, she continues tessalon PRN.  Has not started the fluoxetine. She does take trazodone PRN.  Depression screen [facility] 2/9 02/03/2017 01/29/2017 05/28/2016 06/24/2015 03/13/2014  Decreased Interest 1 0 0 0 0  Down, Depressed, Hopeless 1 0 0 0 0  PHQ - 2 Score 2 0 0 0 0  Altered sleeping 1 - - - -  Tired, decreased energy 1 - - - -  Change in appetite 1 - - - -  Feeling bad or failure about yourself  0 - - - -  Trouble concentrating 2 - - - -  Moving slowly or fidgety/restless 0 - - - -  Suicidal thoughts 0 - - - -  PHQ-9 Score 7 - - - -  Difficult doing work/chores Somewhat difficult - - - -      Allergies  Allergen Reactions  . Vioxx [Rofecoxib] Nausea Only    Pt. Can't remember what reaction was but asks to leave on her profile.     Prior to Admission medications   Medication Sig  Start Date End Date Taking? Authorizing Provider  aspirin 81 MG tablet Take 81 mg by mouth daily. Take 1 tablet daily to prevent heart attack, stroke and clotting.   Yes [provider]  Cholecalciferol (HM VITAMIN D3) 2000 UNITS CAPS Take by mouth. Take 1 tablet daily   Yes [provider]  Cranberry 250 MG TABS Take by mouth. Take 1 tablet daily   Yes [provider]  cycloSPORINE (RESTASIS) 0.05 % ophthalmic emulsion 1 drop 2 (two) times daily.   Yes [provider]  Multiple Vitamin (MULTI VITAMIN DAILY PO) Take by mouth. Centrum Silver for Women Take 1 tablet daily   Yes [provider]  nizatidine (AXID) 150 MG capsule Take one tablet twice daily as needed to reduce stomach acid 10/21/15  Yes Estill Dooms, MD  pravastatin (PRAVACHOL) 40 MG tablet One each night to lower cholesterol 10/13/16  Yes Estill Dooms, MD  traZODone (DESYREL) 150 MG tablet One at bedtime for rest and depression 10/13/16  Yes Estill Dooms, MD  acyclovir (ZOVIRAX) 200 MG capsule One daily as needed to prevent vaginal discomfort related to viral infection. Patient not taking: Reported on 02/03/2017 10/13/16   Estill Dooms, MD  estradiol (ESTRACE) 0.1 MG/GM vaginal cream Place 0.5 Applicatorfuls vaginally 2 (two) times a  week. Patient not taking: Reported on 02/03/2017 12/23/16   Terrance Mass, MD  HYDROcodone-acetaminophen (NORCO/VICODIN) 5-325 MG tablet TK 1 T PO Q 8 H PRF MODERATE PAIN Feb 08, 2017   [provider]  Probiotic Product (PROBIOTIC-10 PO) Take by mouth.    [provider]    Patient Active Problem List   Diagnosis Date Noted  . Elevated blood pressure reading 01/29/2017  . Generalized muscle ache 08-Feb-2017  . Death of child Sep 22, 2016  . Situational stress 22-Sep-2016  . Insomnia Sep 22, 2016  . Lumbago 10/21/2015  . Paresthesia 05/13/2015  . Dyspareunia in female 09/18/2014  . 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 Osteoporosis  01/10/2014  . Vaginal atrophy 01/10/2014  . Abnormal CT of the abdomen 12/19/2013  . Nonspecific abnormal results of liver function study 12/19/2013  . Loss of weight 12/19/2013  . Abnormal CT of the chest 11/24/2013  . Cervicalgia 08/21/2013  . GERD (gastroesophageal reflux disease)   . ALLERGIC RHINITIS DUE TO POLLEN 04/27/2010  . PERSONAL HISTORY OF COLONIC POLYPS 04/27/2010  . Depression 05/05/2007  . Hyperlipidemia 06/29/2006     Physical Exam  Constitutional: She is oriented to person, place, and time. She appears well-developed and well-nourished. She is active and cooperative. No distress.  BP 133/75 (BP Location: Right Arm, Patient Position: Sitting, Cuff Size: Small)   Pulse 63   Temp 97.9 F (36.6 C) (Oral)   Resp 18   Ht 5' (1.524 m)   Wt 102 lb 12.8 oz (46.6 kg)   SpO2 97%   BMI 20.08 kg/m   HENT:  Head: Normocephalic and atraumatic.  Right Ear: Hearing normal.  Left Ear: Hearing normal.  Eyes: Conjunctivae are normal. No scleral icterus.  Neck: Normal range of motion. Neck supple. No thyromegaly present.  Cardiovascular: Normal rate, regular rhythm and normal heart sounds.   Pulses:      Radial pulses are 2+ on the right side, and 2+ on the left side.  Pulmonary/Chest: Effort normal and breath sounds normal.  Lymphadenopathy:       Head (right side): No tonsillar, no preauricular, no posterior auricular and no occipital adenopathy present.       Head (left side): No tonsillar, no preauricular, no posterior auricular and no occipital adenopathy present.    She has no cervical adenopathy.       Right: No supraclavicular adenopathy present.       Left: No supraclavicular adenopathy present.  Neurological: She is alert and oriented to person, place, and time. No sensory deficit.  Skin: Skin is warm, dry and intact. No rash noted. No cyanosis or erythema. Nails show no clubbing.  Psychiatric: Her speech is normal and behavior is normal. Judgment and thought  content normal. Her mood appears not anxious. Her affect is labile. Her affect is not angry, not blunt and not inappropriate. Cognition and memory are normal. She exhibits a depressed mood.      ASSESSMENT & PLAN:  Problem List Items Addressed This Visit    Depression (Chronic)   Death of child    COntinue counseling. Increase trazodone to QHS. Consider the addition of SSRI with her PCP.      Situational stress   Elevated blood pressure reading - Primary    Normal on recheck today. No treatment prescribed. Monitor at future visits with her PCP.       Other Visit Diagnoses    Nocturia       Not really a urinary issue, rather driven by  insomnia, due in turn to grief.       Return as needed.   Fara Chute, PA-C [facility]

## 2017-02-03 NOTE — Patient Instructions (Addendum)
Losing someone you love is never easy. Please make sure that you are getting the psychological support that you need. Your primary care provider can help you with medications and with referring you to a counselor/therapist if needed. Since nighttime is the hardest for you, I encourage you to take the trazodone EVERY NIGHT for a while, rather than only some nights.   We recommend that you schedule a mammogram for breast cancer screening. Typically, you do not need a referral to do this. Please contact a local imaging center to schedule your mammogram.  [facility] - [phone]  *ask for the Radiology Department [facility] ([place]) - [phone] or [phone]  [facility] - [phone] [place] [phone] [facility] - [phone]  *ask for the [facility] - [phone]  *ask for the Radiology Department [facility] - [phone]  *ask for the [place] - [phone]   IF you received an x-ray today, you will receive an invoice from [facility]. Please contact [facility] at [phone] with questions or concerns regarding your invoice.   IF you received labwork today, you will receive an invoice from [facility]. Please contact [facility] at [phone] with questions or concerns regarding your invoice.   Our billing staff will not be able to assist you with questions regarding bills from these companies.  You will be contacted with the lab results as soon as they are available. The fastest way to get your results is to activate your My Chart account. Instructions are located on the last page of this paperwork. If you have not heard from [place] regarding the results in 2 weeks, please contact this office.

## 2017-02-04 ENCOUNTER — Other Ambulatory Visit: Payer: Self-pay | Admitting: Physician Assistant

## 2017-02-04 DIAGNOSIS — R8279 Other abnormal findings on microbiological examination of urine: Secondary | ICD-10-CM

## 2017-02-04 MED ORDER — AMOXICILLIN 500 MG PO CAPS: 500.0000 mg | ORAL_CAPSULE | Freq: Two times a day (BID) | ORAL | 0 refills | Status: AC

## 2017-02-05 NOTE — Assessment & Plan Note (Signed)
COntinue counseling. Increase trazodone to QHS. Consider the addition of SSRI with her PCP.

## 2017-02-05 NOTE — Assessment & Plan Note (Signed)
Normal on recheck today. No treatment prescribed. Monitor at future visits with her PCP.

## 2017-02-10 ENCOUNTER — Encounter: Payer: Self-pay | Admitting: Gastroenterology

## 2017-02-11 ENCOUNTER — Ambulatory Visit: Payer: Medicare Other | Admitting: Internal Medicine

## 2017-02-17 DIAGNOSIS — F329 Major depressive disorder, single episode, unspecified: Secondary | ICD-10-CM | POA: Diagnosis not present

## 2017-03-02 ENCOUNTER — Ambulatory Visit: Payer: Medicare Other | Admitting: Internal Medicine

## 2017-03-04 ENCOUNTER — Ambulatory Visit: Payer: Medicare Other | Admitting: Internal Medicine

## 2017-03-10 DIAGNOSIS — F329 Major depressive disorder, single episode, unspecified: Secondary | ICD-10-CM | POA: Diagnosis not present

## 2017-03-11 ENCOUNTER — Encounter: Payer: Self-pay | Admitting: Internal Medicine

## 2017-03-11 ENCOUNTER — Ambulatory Visit (INDEPENDENT_AMBULATORY_CARE_PROVIDER_SITE_OTHER): Payer: Medicare Other | Admitting: Internal Medicine

## 2017-03-11 VITALS — BP 136/78 | HR 66 | Temp 98.1°F | Ht 60.0 in | Wt 104.0 lb

## 2017-03-11 DIAGNOSIS — I10 Essential (primary) hypertension: Secondary | ICD-10-CM

## 2017-03-11 DIAGNOSIS — F332 Major depressive disorder, recurrent severe without psychotic features: Secondary | ICD-10-CM

## 2017-03-11 DIAGNOSIS — G4700 Insomnia, unspecified: Secondary | ICD-10-CM | POA: Diagnosis not present

## 2017-03-11 DIAGNOSIS — E782 Mixed hyperlipidemia: Secondary | ICD-10-CM | POA: Diagnosis not present

## 2017-03-11 DIAGNOSIS — Z23 Encounter for immunization: Secondary | ICD-10-CM | POA: Diagnosis not present

## 2017-03-11 LAB — TSH: TSH: 2.05 mIU/L

## 2017-03-11 LAB — LIPID PANEL
CHOL/HDL RATIO: 3.3 ratio (ref <5.0)
CHOLESTEROL: 266 mg/dL — AB (ref <200)
HDL: 81 mg/dL (ref >50)
LDL CALC: 149 mg/dL — AB (ref <100)
TRIGLYCERIDES: 178 mg/dL — AB (ref <150)
VLDL: 36 mg/dL — AB (ref <30)

## 2017-03-11 MED ORDER — TETANUS-DIPHTH-ACELL PERTUSSIS 5-2.5-18.5 LF-MCG/0.5 IM SUSP: 0.5000 mL | Freq: Once | INTRAMUSCULAR | 0 refills | Status: AC
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
## 2017-03-11 NOTE — Patient Instructions (Signed)
Recommend Belsomra 10mg  at bedtime to help sleep. May need to further discuss with therapist/psychiatry - samples provided  Continue current medications as ordered  Follow up with psychiatry as scheduled  Will call with lab results  Follow up in 3 mos for depression, HTN, hyperlipidemia.

## 2017-03-15 ENCOUNTER — Encounter: Payer: Self-pay | Admitting: Gastroenterology

## 2017-03-21 ENCOUNTER — Telehealth: Payer: Self-pay | Admitting: *Deleted

## 2017-03-21 MED ORDER — SUVOREXANT 10 MG PO TABS: 1.0000 | ORAL_TABLET | Freq: Every evening | ORAL | 0 refills | Status: DC | PRN

## 2017-03-21 NOTE — Telephone Encounter (Signed)
Ok for belsomra 10mg  #30 take 1 tab po qhs for insomnia with no RF

## 2017-03-21 NOTE — Telephone Encounter (Signed)
Patient called and stated that she was given samples of Belsomra 10mg  at last OV. I reviewed last OV note and samples are noted. Patient stated that they work well and wants a Rx sent to pharmacy. Belsomra was Not added to current medication list, is this something you want to continue? Please Advise.

## 2017-03-21 NOTE — Telephone Encounter (Signed)
Rx printed and faxed to [facility]

## 2017-03-22 ENCOUNTER — Telehealth: Payer: Self-pay

## 2017-03-22 NOTE — Telephone Encounter (Signed)
Spoke with patient, patient states that she just spoke with [facility] and Belsomra will cost over $100 dollars for a 1 month supply. Patient is unable to afford medication at this cost although it worked for her.  Patient is requesting an alternative  Please advise

## 2017-03-22 NOTE — Telephone Encounter (Signed)
No alternative. She has tried other meds without success. She may have samples if we have

## 2017-03-23 NOTE — Telephone Encounter (Signed)
Chrae spoke with patient and left samples of Belsomra up front for pick up

## 2017-03-31 DIAGNOSIS — F329 Major depressive disorder, single episode, unspecified: Secondary | ICD-10-CM | POA: Diagnosis not present

## 2017-04-07 ENCOUNTER — Encounter: Payer: Medicare Other | Admitting: Gastroenterology

## 2017-04-28 ENCOUNTER — Ambulatory Visit (AMBULATORY_SURGERY_CENTER): Payer: Self-pay

## 2017-04-28 VITALS — Ht 60.0 in | Wt 105.0 lb

## 2017-04-28 DIAGNOSIS — Z860100 Personal history of colon polyps, unspecified: Secondary | ICD-10-CM

## 2017-04-28 MED ORDER — SUPREP BOWEL PREP KIT 17.5-3.13-1.6 GM/177ML PO SOLN: 1.0000 | Freq: Once | ORAL | 0 refills | Status: AC

## 2017-04-28 NOTE — Progress Notes (Signed)
No allergies to eggs or soy No diet meds No home oxygen No past problems with anesthesia  Recently son died in [place]; pt very tearful and emotionally fragile  Declined emmi

## 2017-05-03 ENCOUNTER — Telehealth: Payer: Self-pay | Admitting: Gastroenterology

## 2017-05-03 NOTE — Telephone Encounter (Signed)
Spoke with pt. She states she is unable to afford the Suprep even with a coupon. The pt will come to our office to pick up a sample. Suprep sample lot # [id number], Exp 07/20 will be left at the front desk on the 4th floor. She pt will pick up the Rx on Thursday and call our office if she has further problems.

## 2017-05-13 ENCOUNTER — Ambulatory Visit (AMBULATORY_SURGERY_CENTER): Payer: Medicare Other | Admitting: Gastroenterology

## 2017-05-13 ENCOUNTER — Encounter: Payer: Self-pay | Admitting: Gastroenterology

## 2017-05-13 VITALS — BP 130/71 | HR 67 | Temp 97.1°F | Resp 13 | Ht 60.0 in | Wt 105.0 lb

## 2017-05-13 DIAGNOSIS — Z86010 Personal history of colonic polyps: Secondary | ICD-10-CM | POA: Diagnosis not present

## 2017-05-13 DIAGNOSIS — D122 Benign neoplasm of ascending colon: Secondary | ICD-10-CM | POA: Diagnosis not present

## 2017-05-13 MED ORDER — SODIUM CHLORIDE 0.9 % IV SOLN: 500.0000 mL | INTRAVENOUS | Status: DC

## 2017-05-13 NOTE — Progress Notes (Signed)
To PACU, VSS. Report to RN.tb 

## 2017-05-13 NOTE — Op Note (Signed)
[facility] Patient Name: Caroline Snyder Procedure Date: 05/13/2017 9:30 AM MRN: [id number] Endoscopist: Remo Lipps P. Armbruster MD, MD Age: 76 Referring MD:  Date of Birth: February 29, 1941 Gender: Female Account #: [id number] Procedure:                Colonoscopy Indications:              Surveillance: Personal history of adenomatous                            polyps on last colonoscopy 5 years ago Medicines:                Monitored Anesthesia Care Procedure:                Pre-Anesthesia Assessment:                           - Prior to the procedure, a History and Physical                            was performed, and patient medications and                            allergies were reviewed. The patient's tolerance of                            previous anesthesia was also reviewed. The risks                            and benefits of the procedure and the sedation                            options and risks were discussed with the patient.                            All questions were answered, and informed consent                            was obtained. Prior Anticoagulants: The patient has                            taken no previous anticoagulant or antiplatelet                            agents. ASA Grade Assessment: II - A patient with                            mild systemic disease. After reviewing the risks                            and benefits, the patient was deemed in                            satisfactory condition to undergo the procedure.  After obtaining informed consent, the colonoscope                            was passed under direct vision. Throughout the                            procedure, the patient's blood pressure, pulse, and                            oxygen saturations were monitored continuously. The                            Colonoscope was introduced through the anus and                            advanced to the  the cecum, identified by                            appendiceal orifice and ileocecal valve. The                            colonoscopy was performed without difficulty. The                            patient tolerated the procedure well. The quality                            of the bowel preparation was adequate. The                            ileocecal valve, appendiceal orifice, and rectum                            were photographed. Scope In: 9:39:08 AM Scope Out: 9:53:14 AM Scope Withdrawal Time: 0 hours 9 minutes 35 seconds  Total Procedure Duration: 0 hours 14 minutes 6 seconds  Findings:                 The perianal and digital rectal examinations were                            normal.                           A 4 mm polyp was found in the ascending colon. The                            polyp was sessile. The polyp was removed with a                            cold snare. Resection and retrieval were complete.                           Multiple medium-mouthed diverticula were found in  the sigmoid colon.                           The exam was otherwise without abnormality on                            direct and retroflexion views. Complications:            No immediate complications. Estimated blood loss:                            Minimal. Estimated Blood Loss:     Estimated blood loss was minimal. Impression:               - One 4 mm polyp in the ascending colon, removed                            with a cold snare. Resected and retrieved.                           - Diverticulosis in the sigmoid colon.                           - The examination was otherwise normal on direct                            and retroflexion views. Recommendation:           - Patient has a contact number available for                            emergencies. The signs and symptoms of potential                            delayed complications were discussed with the                             patient. Return to normal activities tomorrow.                            Written discharge instructions were provided to the                            patient.                           - Resume previous diet.                           - Continue present medications.                           - Await pathology results.                           - Likely no further surveillance colonoscopy is  warranted (you will be 76 years old at earliest                            when we would next consider an exam) Remo Lipps P. Armbruster MD, MD 05/13/2017 9:56:44 AM This report has been signed electronically.

## 2017-05-13 NOTE — Patient Instructions (Signed)
Impressions/recommendations:  Polyp (handout given) Diverticulosis (handout given)  YOU HAD AN ENDOSCOPIC PROCEDURE TODAY AT [facility]:   Refer to the procedure report that was given to you for any specific questions about what was found during the examination.  If the procedure report does not answer your questions, please call your gastroenterologist to clarify.  If you requested that your care partner not be given the details of your procedure findings, then the procedure report has been included in a sealed envelope for you to review at your convenience later.  YOU SHOULD EXPECT: Some feelings of bloating in the abdomen. Passage of more gas than usual.  Walking can help get rid of the air that was put into your GI tract during the procedure and reduce the bloating. If you had a lower endoscopy (such as a colonoscopy or flexible sigmoidoscopy) you may notice spotting of blood in your stool or on the toilet paper. If you underwent a bowel prep for your procedure, you may not have a normal bowel movement for a few days.  Please Note:  You might notice some irritation and congestion in your nose or some drainage.  This is from the oxygen used during your procedure.  There is no need for concern and it should clear up in a day or so.  SYMPTOMS TO REPORT IMMEDIATELY:   Following lower endoscopy (colonoscopy or flexible sigmoidoscopy):  Excessive amounts of blood in the stool  Significant tenderness or worsening of abdominal pains  Swelling of the abdomen that is new, acute  Fever of 100F or higher   For urgent or emergent issues, a gastroenterologist can be reached at any hour by calling [phone].   DIET:  We do recommend a small meal at first, but then you may proceed to your regular diet.  Drink plenty of fluids but you should avoid alcoholic beverages for 24 hours.  ACTIVITY:  You should plan to take it easy for the rest of today and you should NOT DRIVE or use  heavy machinery until tomorrow (because of the sedation medicines used during the test).    FOLLOW UP: Our staff will call the number listed on your records the next business day following your procedure to check on you and address any questions or concerns that you may have regarding the information given to you following your procedure. If we do not reach you, we will leave a message.  However, if you are feeling well and you are not experiencing any problems, there is no need to return our call.  We will assume that you have returned to your regular daily activities without incident.  If any biopsies were taken you will be contacted by phone or by letter within the next 1-3 weeks.  Please call us at [phone] if you have not heard about the biopsies in 3 weeks.    SIGNATURES/CONFIDENTIALITY: You and/or your care partner have signed paperwork which will be entered into your electronic medical record.  These signatures attest to the fact that that the information above on your After Visit Summary has been reviewed and is understood.  Full responsibility of the confidentiality of this discharge information lies with you and/or your care-partner.

## 2017-05-13 NOTE — Progress Notes (Signed)
Called to room to assist during endoscopic procedure.  Patient ID and intended procedure confirmed with present staff. Received instructions for my participation in the procedure from the performing physician.  

## 2017-05-16 ENCOUNTER — Telehealth: Payer: Self-pay

## 2017-05-16 NOTE — Telephone Encounter (Signed)
  Follow up Call-  Call back number 05/13/2017  Post procedure Call Back phone  # [phone]  Permission to leave phone message Yes  Some recent data might be hidden     Patient questions:  Do you have a fever, pain , or abdominal swelling? No. Pain Score  0 *  Have you tolerated food without any problems? Yes.    Have you been able to return to your normal activities? Yes.    Do you have any questions about your discharge instructions: Diet   No. Medications  No. Follow up visit  No.  Do you have questions or concerns about your Care? No.  Actions: * If pain score is 4 or above: No action needed, pain <4.

## 2017-05-17 ENCOUNTER — Encounter: Payer: Self-pay | Admitting: Gastroenterology

## 2017-06-02 ENCOUNTER — Ambulatory Visit (INDEPENDENT_AMBULATORY_CARE_PROVIDER_SITE_OTHER): Payer: Medicare Other

## 2017-06-02 VITALS — BP 162/80 | HR 69 | Temp 98.2°F | Ht 60.0 in | Wt 105.0 lb

## 2017-06-02 DIAGNOSIS — Z23 Encounter for immunization: Secondary | ICD-10-CM

## 2017-06-02 DIAGNOSIS — F329 Major depressive disorder, single episode, unspecified: Secondary | ICD-10-CM | POA: Diagnosis not present

## 2017-06-02 DIAGNOSIS — Z0000 Encounter for general adult medical examination without abnormal findings: Secondary | ICD-10-CM | POA: Diagnosis not present

## 2017-06-02 MED ORDER — TETANUS-DIPHTH-ACELL PERTUSSIS 5-2.5-18.5 LF-MCG/0.5 IM SUSP: 0.5000 mL | Freq: Once | INTRAMUSCULAR | 0 refills | Status: AC

## 2017-06-02 MED ORDER — ZOSTER VAC RECOMB ADJUVANTED 50 MCG/0.5ML IM SUSR: 0.5000 mL | Freq: Once | INTRAMUSCULAR | 1 refills | Status: AC
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
## 2017-06-02 NOTE — Patient Instructions (Signed)
[name] , Thank you for taking time to come for your Medicare Wellness Visit. I appreciate your ongoing commitment to your health goals. Please review the following plan we discussed and let me know if I can assist you in the future.   Screening recommendations/referrals: Colonoscopy excluded, you are over age 77  Mammogram excluded, you are over age 37 Bone Density up to date Recommended yearly ophthalmology/optometry visit for glaucoma screening and checkup Recommended yearly dental visit for hygiene and checkup  Vaccinations: Influenza vaccine given today. Due 2019 fall season Pneumococcal vaccine up to date Tdap vaccine due, prescription sent to pharmacy Shingles vaccine due, prescription sent to pharmacy    Advanced directives: Advance directive discussed with you today. I have provided a copy for you to complete at home and have notarized. Once this is complete please bring a copy in to our office so we can scan it into your chart.  Conditions/risks identified: None  Next appointment: Dr. Eulas Post 06/17/2017 @ 11:30am   Preventive Care 65 Years and Older, Female Preventive care refers to lifestyle choices and visits with your health care provider that can promote health and wellness. What does preventive care include?  A yearly physical exam. This is also called an annual well check.  Dental exams once or twice a year.  Routine eye exams. Ask your health care provider how often you should have your eyes checked.  Personal lifestyle choices, including:  Daily care of your teeth and gums.  Regular physical activity.  Eating a healthy diet.  Avoiding tobacco and drug use.  Limiting alcohol use.  Practicing safe sex.  Taking low-dose aspirin every day.  Taking vitamin and mineral supplements as recommended by your health care provider. What happens during an annual well check? The services and screenings done by your health care provider during your annual well  check will depend on your age, overall health, lifestyle risk factors, and family history of disease. Counseling  Your health care provider may ask you questions about your:  Alcohol use.  Tobacco use.  Drug use.  Emotional well-being.  Home and relationship well-being.  Sexual activity.  Eating habits.  History of falls.  Memory and ability to understand (cognition).  Work and work Statistician.  Reproductive health. Screening  You may have the following tests or measurements:  Height, weight, and BMI.  Blood pressure.  Lipid and cholesterol levels. These may be checked every 5 years, or more frequently if you are over 80 years old.  Skin check.  Lung cancer screening. You may have this screening every year starting at age 25 if you have a 30-pack-year history of smoking and currently smoke or have quit within the past 15 years.  Fecal occult blood test (FOBT) of the stool. You may have this test every year starting at age 11.  Flexible sigmoidoscopy or colonoscopy. You may have a sigmoidoscopy every 5 years or a colonoscopy every 10 years starting at age 39.  Hepatitis C blood test.  Hepatitis B blood test.  Sexually transmitted disease (STD) testing.  Diabetes screening. This is done by checking your blood sugar (glucose) after you have not eaten for a while (fasting). You may have this done every 1-3 years.  Bone density scan. This is done to screen for osteoporosis. You may have this done starting at age 71.  Mammogram. This may be done every 1-2 years. Talk to your health care provider about how often you should have regular mammograms. Talk with your health  care provider about your test results, treatment options, and if necessary, the need for more tests. Vaccines  Your health care provider may recommend certain vaccines, such as:  Influenza vaccine. This is recommended every year.  Tetanus, diphtheria, and acellular pertussis (Tdap, Td) vaccine. You  may need a Td booster every 10 years.  Zoster vaccine. You may need this after age 72.  Pneumococcal 13-valent conjugate (PCV13) vaccine. One dose is recommended after age 86.  Pneumococcal polysaccharide (PPSV23) vaccine. One dose is recommended after age 55. Talk to your health care provider about which screenings and vaccines you need and how often you need them. This information is not intended to replace advice given to you by your health care provider. Make sure you discuss any questions you have with your health care provider. Document Released: 08/15/2015 Document Revised: 04/07/2016 Document Reviewed: 05/20/2015 Elsevier Interactive Patient Education  2017 [place] Prevention in the Home Falls can cause injuries. They can happen to people of all ages. There are many things you can do to make your home safe and to help prevent falls. What can I do on the outside of my home?  Regularly fix the edges of walkways and driveways and fix any cracks.  Remove anything that might make you trip as you walk through a door, such as a raised step or threshold.  Trim any bushes or trees on the path to your home.  Use bright outdoor lighting.  Clear any walking paths of anything that might make someone trip, such as rocks or tools.  Regularly check to see if handrails are loose or broken. Make sure that both sides of any steps have handrails.  Any raised decks and porches should have guardrails on the edges.  Have any leaves, snow, or ice cleared regularly.  Use sand or salt on walking paths during winter.  Clean up any spills in your garage right away. This includes oil or grease spills. What can I do in the bathroom?  Use night lights.  Install grab bars by the toilet and in the tub and shower. Do not use towel bars as grab bars.  Use non-skid mats or decals in the tub or shower.  If you need to sit down in the shower, use a plastic, non-slip stool.  Keep the floor  dry. Clean up any water that spills on the floor as soon as it happens.  Remove soap buildup in the tub or shower regularly.  Attach bath mats securely with double-sided non-slip rug tape.  Do not have throw rugs and other things on the floor that can make you trip. What can I do in the bedroom?  Use night lights.  Make sure that you have a light by your bed that is easy to reach.  Do not use any sheets or blankets that are too big for your bed. They should not hang down onto the floor.  Have a firm chair that has side arms. You can use this for support while you get dressed.  Do not have throw rugs and other things on the floor that can make you trip. What can I do in the kitchen?  Clean up any spills right away.  Avoid walking on wet floors.  Keep items that you use a lot in easy-to-reach places.  If you need to reach something above you, use a strong step stool that has a grab bar.  Keep electrical cords out of the way.  Do not use floor  polish or wax that makes floors slippery. If you must use wax, use non-skid floor wax.  Do not have throw rugs and other things on the floor that can make you trip. What can I do with my stairs?  Do not leave any items on the stairs.  Make sure that there are handrails on both sides of the stairs and use them. Fix handrails that are broken or loose. Make sure that handrails are as long as the stairways.  Check any carpeting to make sure that it is firmly attached to the stairs. Fix any carpet that is loose or worn.  Avoid having throw rugs at the top or bottom of the stairs. If you do have throw rugs, attach them to the floor with carpet tape.  Make sure that you have a light switch at the top of the stairs and the bottom of the stairs. If you do not have them, ask someone to add them for you. What else can I do to help prevent falls?  Wear shoes that:  Do not have high heels.  Have rubber bottoms.  Are comfortable and fit you  well.  Are closed at the toe. Do not wear sandals.  If you use a stepladder:  Make sure that it is fully opened. Do not climb a closed stepladder.  Make sure that both sides of the stepladder are locked into place.  Ask someone to hold it for you, if possible.  Clearly mark and make sure that you can see:  Any grab bars or handrails.  First and last steps.  Where the edge of each step is.  Use tools that help you move around (mobility aids) if they are needed. These include:  Canes.  Walkers.  Scooters.  Crutches.  Turn on the lights when you go into a dark area. Replace any light bulbs as soon as they burn out.  Set up your furniture so you have a clear path. Avoid moving your furniture around.  If any of your floors are uneven, fix them.  If there are any pets around you, be aware of where they are.  Review your medicines with your doctor. Some medicines can make you feel dizzy. This can increase your chance of falling. Ask your doctor what other things that you can do to help prevent falls. This information is not intended to replace advice given to you by your health care provider. Make sure you discuss any questions you have with your health care provider. Document Released: 05/15/2009 Document Revised: 12/25/2015 Document Reviewed: 08/23/2014 Elsevier Interactive Patient Education  2017 [employer].

## 2017-06-09 DIAGNOSIS — Z1231 Encounter for screening mammogram for malignant neoplasm of breast: Secondary | ICD-10-CM | POA: Diagnosis not present

## 2017-06-09 LAB — HM MAMMOGRAPHY

## 2017-06-17 ENCOUNTER — Encounter: Payer: Self-pay | Admitting: Internal Medicine

## 2017-06-17 ENCOUNTER — Ambulatory Visit (INDEPENDENT_AMBULATORY_CARE_PROVIDER_SITE_OTHER): Payer: Medicare Other | Admitting: Internal Medicine

## 2017-06-17 VITALS — BP 130/78 | HR 72 | Resp 12 | Ht 60.0 in | Wt 104.0 lb

## 2017-06-17 DIAGNOSIS — E782 Mixed hyperlipidemia: Secondary | ICD-10-CM | POA: Diagnosis not present

## 2017-06-17 DIAGNOSIS — F332 Major depressive disorder, recurrent severe without psychotic features: Secondary | ICD-10-CM

## 2017-06-17 DIAGNOSIS — I10 Essential (primary) hypertension: Secondary | ICD-10-CM

## 2017-06-17 DIAGNOSIS — G4700 Insomnia, unspecified: Secondary | ICD-10-CM

## 2017-06-17 DIAGNOSIS — Z79899 Other long term (current) drug therapy: Secondary | ICD-10-CM | POA: Diagnosis not present

## 2017-06-17 DIAGNOSIS — S63501A Unspecified sprain of right wrist, initial encounter: Secondary | ICD-10-CM | POA: Diagnosis not present

## 2017-06-17 LAB — BASIC METABOLIC PANEL WITH GFR
BUN: 18 mg/dL (ref 7–25)
CHLORIDE: 104 mmol/L (ref 98–110)
CO2: 29 mmol/L (ref 20–32)
CREATININE: 0.83 mg/dL (ref 0.60–0.93)
Calcium: 9.6 mg/dL (ref 8.6–10.4)
GFR, Est African American: 79 mL/min/{1.73_m2} (ref >=60)
GFR, Est Non African American: 68 mL/min/{1.73_m2} (ref >=60)
GLUCOSE: 85 mg/dL (ref 65–139)
Potassium: 4.7 mmol/L (ref 3.5–5.3)
SODIUM: 141 mmol/L (ref 135–146)

## 2017-06-17 LAB — LIPID PANEL
CHOL/HDL RATIO: 3.5 (calc) (ref <5.0)
Cholesterol: 253 mg/dL — ABNORMAL HIGH (ref <200)
HDL: 73 mg/dL (ref >50)
LDL CHOLESTEROL (CALC): 149 mg/dL — AB
NON-HDL CHOLESTEROL (CALC): 180 mg/dL — AB (ref <130)
TRIGLYCERIDES: 178 mg/dL — AB (ref <150)

## 2017-06-17 LAB — ALT: ALT: 15 U/L (ref 6–29)

## 2017-06-17 MED ORDER — SERTRALINE HCL 25 MG PO TABS: 25.0000 mg | ORAL_TABLET | Freq: Every day | ORAL | 6 refills | Status: DC

## 2017-06-17 NOTE — Progress Notes (Signed)
Patient ID: Caroline Snyder, female   DOB: [birth year], 76 y.o.   MRN: [id number]    Location:  [facility] Place of Service: OFFICE  Chief Complaint  Patient presents with  . Medical Management of Chronic Issues    3 month follow-up on depression, HTN and hyperlipidemia. Patient's Son was killed 07/14/17 and now that December is coming back around patient is more depressed and nothing is helping her sleep. Patient falls asleep ok but is up and down throughout the night   . Medication Refill    No refills needed  . Immunizations    Discuss need for pneumonia vaccine     HPI:  76 yo female seen today for f/u. She continues to struggle with insomnia and has difficulty staying asleep. belsomra did not help. She believes she sprang her right wrist while assisting her client at work last week. Weight down 1 lb since early Nov 2018  She continues to struggle with grief. She is followed by counselor. She does not sleep well at all. The anniversary of son's death is approaching. She cries easily. "the fog" of losing son is some better. No SI/HI  MDD - followed by therapist. She has insomnia (difficulty staying asleep; excessively thinking about younger son [name] who expired suddenly in [place] 07/14/16. He was her primary caregiver). No relief with trazodone, remeron or belsomra. She was encouraged to make appt with psychiatrist in therapist office but unable to determine if she followed through with that.   HTN - diet controlled. Takes ASA daily  GERD - stable on axid  Hyperlipidemia - uncontrolled on pravastatin '40mg'$ . LDL 149; Tchol 266; TG 178; HDL 81  Osteoporosis - takes vitamin D3 2000 units daily  Vaginal atrophy - stable on estradiol. Zovirax helps dyspareunia  Chronic pain/lumbago - stable on norco prn  Past Medical History:  Diagnosis Date  . Allergy   . 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 Bunion 07/01/2010  . Depression 05/05/2007  . External hemorrhoids without mention of  complication [id number]/[id number]  . GERD (gastroesophageal reflux disease) 2000  . Hyperlipidemia 06/29/2006  . Memory loss 04/15/2003  . Migraine without aura, without mention of intractable migraine without mention of status migrainosus 03/24/1999  . Osteoporosis, unspecified 04/15/2004  . 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 Unspecified essential hypertension 11/22/2007    Past Surgical History:  Procedure Laterality Date  . COLONOSCOPY  06-16-2007   Internal hemorrhoids,laxity of anal sphincter,polyp at 25cm form anal verge, tortuous sigmoid colon. Dr.Orr   . EYE SURGERY Bilateral 2010   cataract Dr. Bing Plume  . VAGINAL HYSTERECTOMY  1980    Patient Care Team: Gildardo Cranker, DO as PCP - General (Internal Medicine) [name], MD as Consulting Physician (Ophthalmology) Calvert Cantor, MD as Consulting Physician (Ophthalmology) Terrance Mass, MD as Consulting Physician (Gynecology)  Social History   Socioeconomic History  . Marital status: Widowed    Spouse name: Not on file  . Number of children: 2  . Years of education: Not on file  . Highest education level: Not on file  Social Needs  . Financial resource strain: Not on file  . Food insecurity - worry: Not on file  . Food insecurity - inability: Not on file  . Transportation needs - medical: Not on file  . Transportation needs - non-medical: Not on file  Occupational History  . Occupation: Retired  Tobacco Use  . Smoking status: Never Smoker  . Smokeless tobacco: Never Used  Substance and Sexual Activity  .  Alcohol use: No    Alcohol/week: 0.0 oz  . Drug use: No  . Sexual activity: Not Currently  Other Topics Concern  . Not on file  Social History Narrative  . Not on file     reports that  has never smoked. she has never used smokeless tobacco. She reports that she does not drink alcohol or use drugs.  Family History  Problem Relation Age of Onset  . Alzheimer's disease Mother   . Diabetes  Mother   . Transient ischemic attack Mother   . Heart disease Mother        MI, CHF  . Alcohol abuse Father   . Kidney disease Father   . Hypertension Sister   . Hypertension Brother   . Heart disease Brother   . Hypertension Brother   . Hypertension Sister   . Hypertension Brother   . Other Son        truck accident 07/2016  . Colon cancer Neg Hx   . Stomach cancer Neg Hx    Family Status  Relation Name Status  . Mother Jordan Likes  Deceased  . Father  Deceased  . Sister Gust Rung  . Brother Joneen Boers Deceased       Heart disease  . Son [place]  . Brother [employer]  . Sister [employer]  . Brother [employer]  . Son [name] Deceased  . Neg Hx  (Not Specified)     Allergies  Allergen Reactions  . Vioxx [Rofecoxib] Nausea Only    Pt. Can't remember what reaction was but asks to leave on her profile.     Medications:   Medication List        Accurate as of 06/17/17 12:23 PM. Always use your most recent med list.          acyclovir 200 MG capsule Commonly known as:  ZOVIRAX One daily as needed to prevent vaginal discomfort related to viral infection.   aspirin 81 MG tablet   Cranberry 250 MG Tabs   estradiol 0.1 MG/GM vaginal cream Commonly known as:  ESTRACE   HM VITAMIN D3 2000 units Caps Generic drug:  Cholecalciferol   HYDROcodone-acetaminophen 5-325 MG tablet Commonly known as:  NORCO/VICODIN   MULTI VITAMIN DAILY PO   nizatidine 150 MG capsule Commonly known as:  AXID Take one tablet twice daily as needed to reduce stomach acid   pravastatin 40 MG tablet Commonly known as:  PRAVACHOL One each night to lower cholesterol   PROBIOTIC-10 PO   RESTASIS 0.05 % ophthalmic emulsion Generic drug:  cycloSPORINE   traZODone 150 MG tablet Commonly known as:  DESYREL One at bedtime for rest and depression       Review of Systems  Vitals:   06/17/17 1132  BP: 130/78  Pulse: 72  Resp: 12  Weight: 104 lb (47.2 kg)  Height: 5'  (1.524 m)   Body mass index is 20.31 kg/m.  Physical Exam  Constitutional: She is oriented to person, place, and time. She appears well-developed and well-nourished.  HENT:  Mouth/Throat: Oropharynx is clear and moist. No oropharyngeal exudate.  MMM; no oral thrush  Eyes: Pupils are equal, round, and reactive to light. No scleral icterus.  Neck: Neck supple. Carotid bruit is not present. No tracheal deviation present. No thyromegaly present.  Cardiovascular: Normal rate, regular rhythm and intact distal pulses. Exam reveals no gallop and no friction rub.  Murmur (1/6 SEM) heard. No LE edema b/l. no calf TTP.  Pulmonary/Chest: Effort normal and breath sounds normal. No stridor. No respiratory distress. She has no wheezes. She has no rales.  Abdominal: Soft. Normal appearance and bowel sounds are normal. She exhibits no distension and no mass. There is no hepatomegaly. There is no tenderness. There is no rigidity, no rebound and no guarding. No hernia.  Musculoskeletal: She exhibits edema and tenderness.  Right anterior medial wrist TTP with swelling; ROM intact  Lymphadenopathy:    She has no cervical adenopathy.  Neurological: She is alert and oriented to person, place, and time.  Skin: Skin is warm and dry. No rash noted.  Psychiatric: Her behavior is normal. Judgment and thought content normal. Her affect is blunt. She exhibits a depressed mood.     Labs reviewed: Abstract on 06/10/2017  Component Date Value Ref Range Status  . HM Mammogram 06/09/2017 0-4 Bi-Rad  0-4 Bi-Rad, Self Reported Normal Final   Category 2: benign-[facility] mammography    No results found.   Assessment/Plan   ICD-10-CM   1. Sprain of right wrist, initial encounter S63.501A   2. Mixed hyperlipidemia E78.2 Lipid Panel  3. Severe episode of recurrent major depressive disorder, without psychotic features (HCC) F33.2 sertraline (ZOLOFT) 25 MG tablet  4. Insomnia, unspecified type G47.00   5. Essential  hypertension I10   6. High risk medication use Z79.899 BMP with eGFR    ALT   START SERTRALINE '25MG'$  DAILY FOR DEPRESSION  Will call with lab results  Follow up with therapist as scheduled  For wrist sprain - rest, apply ice as needed to reduce swelling and pain, keep elevated. May take tylenol ES as needed for pain  Maintain low fat/low cholesterol diet  Follow up in 1 mo for depression  Handout on wrist sprain given   Marielle Mantione S. Perlie Gold  [facility] [address] [place], [place] [address] [phone] Cell (Monday-Friday 8 AM - 5 PM) [phone] After 5 PM and follow prompts

## 2017-06-17 NOTE — Patient Instructions (Addendum)
START SERTRALINE 25MG  DAILY FOR DEPRESSION  Will call with lab results  Follow up with therapist as scheduled  For wrist sprain - rest, apply ice as needed to reduce swelling and pain, keep elevated. May take tylenol ES as needed for pain  Follow up in 1 mo for depression   Wrist Sprain, Adult A wrist sprain is a stretch or tear in the strong, fibrous tissues (ligaments) that connect your wrist bones. There are three types of wrist sprains:  Grade 1. In this type of sprain, the ligament is stretched more than normal.  Grade 2. In this type of sprain, the ligament is partially torn. You may be able to move your wrist, but not very much.  Grade 3. In this type of sprain, the ligament or muscle is completely torn. You may find it difficult or extremely painful to move your wrist even a little.  What are the causes? A wrist sprain can be caused by using the wrist too much during sports, exercise, or at work. It can also happen with a fall or during an accident. What increases the risk? This condition is more likely to occur in people:  With a previous wrist or arm injury.  With poor wrist strength and flexibility.  Who play contact sports, such as football or soccer.  Who play sports that may result in a fall, such as skateboarding, biking, skiing, or snowboarding.  Who do not exercise regularly.  Who use exercise equipment that does not fit well.  What are the signs or symptoms? Symptoms of this condition include:  Pain in the wrist, arm, or hand.  Swelling or bruised skin near the wrist, hand, or arm. The skin may look yellow or kind of blue.  Stiffness or trouble moving the hand.  Hearing a pop or feeling a tear at the time of the injury.  A warm feeling in the skin around the wrist.  How is this diagnosed? This condition is diagnosed with a physical exam. Sometimes an X-ray is taken to make sure a bone did not break. If your health care provider thinks that you tore  a ligament, he or she may order an MRI of your wrist. How is this treated? This condition is treated by resting and applying ice to your wrist. Additional treatment may include:  Medicine for pain and inflammation.  A splint to keep your wrist still (immobilized).  Exercises to strengthen and stretch your wrist.  Surgery. This may be done if the ligament is completely torn.  Follow these instructions at home: If you have a splint:   Do not put pressure on any part of the splint until it is fully hardened. This may take several hours.  Wear the splint as told by your health care provider. Remove it only as told by your health care provider.  Loosen the splint if your fingers tingle, become numb, or turn cold and blue.  If your splint is not waterproof: ? Do not let it get wet. ? Cover it with a watertight covering when you take a bath or a shower.  Keep the splint clean. Managing pain, stiffness, and swelling   If directed, put ice on the injured area. ? If you have a removable splint, remove it as told by your health care provider. ? Put ice in a plastic bag. ? Place a towel between your skin and the bag or between the splint and the bag. ? Leave the ice on for 20 minutes, 2-3 times  per day.  Move your fingers often to avoid stiffness and to lessen swelling.  Raise (elevate) the injured area above the level of your heart while you are sitting or lying down. Activity  Rest your wrist. Do not do things that cause pain.  Return to your normal activities as told by your health care provider. Ask your health care provider what activities are safe for you.  Do exercises as told by your health care provider. General instructions  Take over-the-counter and prescription medicines only as told by your health care provider.  Do not use any products that contain nicotine or tobacco, such as cigarettes and e-cigarettes. These can delay healing. If you need help quitting, ask your  health care provider.  Ask your health care provider when it is safe to drive if you have a splint.  Keep all follow-up visits as told by your health care provider. This is important. Contact a health care provider if:  Your pain, bruising, or swelling gets worse.  Your skin becomes red, gets a rash, or has open sores.  Your pain does not get better or it gets worse. Get help right away if:  You have a new or sudden sharp pain in the hand, arm, or wrist.  You have tingling or numbness in your hand.  Your fingers turn white, very red, or cold and blue.  You cannot move your fingers. This information is not intended to replace advice given to you by your health care provider. Make sure you discuss any questions you have with your health care provider. Document Released: 03/22/2014 Document Revised: 02/14/2016 Document Reviewed: 02/05/2016 Elsevier Interactive Patient Education  2017 [employer].

## 2017-07-08 DIAGNOSIS — F329 Major depressive disorder, single episode, unspecified: Secondary | ICD-10-CM | POA: Diagnosis not present

## 2017-07-28 DIAGNOSIS — F329 Major depressive disorder, single episode, unspecified: Secondary | ICD-10-CM | POA: Diagnosis not present

## 2017-07-29 ENCOUNTER — Ambulatory Visit: Payer: Medicare Other | Admitting: Internal Medicine

## 2017-08-04 ENCOUNTER — Ambulatory Visit (INDEPENDENT_AMBULATORY_CARE_PROVIDER_SITE_OTHER): Payer: Medicare Other | Admitting: Nurse Practitioner

## 2017-08-04 ENCOUNTER — Ambulatory Visit
Admission: RE | Admit: 2017-08-04 | Discharge: 2017-08-04 | Disposition: A | Discharge status: Home / Self Care | Payer: Medicare Other | Source: Ambulatory Visit | Attending: Nurse Practitioner | Admitting: Nurse Practitioner

## 2017-08-04 ENCOUNTER — Encounter: Payer: Self-pay | Admitting: Nurse Practitioner

## 2017-08-04 VITALS — BP 126/74 | HR 62 | Temp 98.2°F | Resp 17 | Ht 60.0 in | Wt 101.0 lb

## 2017-08-04 DIAGNOSIS — M25531 Pain in right wrist: Secondary | ICD-10-CM | POA: Diagnosis not present

## 2017-08-04 DIAGNOSIS — M25431 Effusion, right wrist: Secondary | ICD-10-CM

## 2017-08-04 DIAGNOSIS — M79631 Pain in right forearm: Secondary | ICD-10-CM | POA: Diagnosis not present

## 2017-08-04 DIAGNOSIS — F332 Major depressive disorder, recurrent severe without psychotic features: Secondary | ICD-10-CM

## 2017-08-04 DIAGNOSIS — S59911A Unspecified injury of right forearm, initial encounter: Secondary | ICD-10-CM | POA: Diagnosis not present

## 2017-08-04 DIAGNOSIS — S6991XA Unspecified injury of right wrist, hand and finger(s), initial encounter: Secondary | ICD-10-CM | POA: Diagnosis not present
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
## 2017-08-04 NOTE — Patient Instructions (Addendum)
To use ALEVE (naproxen) 220 mg by mouth twice daily for 1 week routinely Cont to use tylenol as needed for break through pain.  Cont brace and avoid overuse of wrist To get xray to rule out fracture  To take sertraline every day routinely

## 2017-08-05 LAB — BASIC METABOLIC PANEL WITH GFR
BUN: 13 mg/dL (ref 7–25)
CALCIUM: 9.9 mg/dL (ref 8.6–10.4)
CHLORIDE: 103 mmol/L (ref 98–110)
CO2: 28 mmol/L (ref 20–32)
Creat: 0.83 mg/dL (ref 0.60–0.93)
GFR, EST AFRICAN AMERICAN: 79 mL/min/{1.73_m2} (ref >=60)
GFR, EST NON AFRICAN AMERICAN: 68 mL/min/{1.73_m2} (ref >=60)
Glucose, Bld: 88 mg/dL (ref 65–139)
Potassium: 4.5 mmol/L (ref 3.5–5.3)
Sodium: 138 mmol/L (ref 135–146)

## 2017-08-08 ENCOUNTER — Other Ambulatory Visit: Payer: Self-pay | Admitting: *Deleted

## 2017-08-08 DIAGNOSIS — F332 Major depressive disorder, recurrent severe without psychotic features: Secondary | ICD-10-CM

## 2017-08-08 MED ORDER — SERTRALINE HCL 25 MG PO TABS: 25.0000 mg | ORAL_TABLET | Freq: Every day | ORAL | 1 refills | Status: DC

## 2017-08-08 NOTE — Telephone Encounter (Signed)
[facility] 

## 2017-08-25 DIAGNOSIS — F329 Major depressive disorder, single episode, unspecified: Secondary | ICD-10-CM | POA: Diagnosis not present

## 2017-09-29 DIAGNOSIS — F329 Major depressive disorder, single episode, unspecified: Secondary | ICD-10-CM | POA: Diagnosis not present

## 2017-10-07 ENCOUNTER — Ambulatory Visit: Payer: Medicare Other | Admitting: Internal Medicine

## 2017-10-14 ENCOUNTER — Ambulatory Visit (INDEPENDENT_AMBULATORY_CARE_PROVIDER_SITE_OTHER): Payer: Medicare Other | Admitting: Internal Medicine

## 2017-10-14 ENCOUNTER — Encounter: Payer: Self-pay | Admitting: Internal Medicine

## 2017-10-14 VITALS — BP 154/88 | HR 77 | Temp 98.0°F | Ht 60.0 in | Wt 106.2 lb

## 2017-10-14 DIAGNOSIS — R197 Diarrhea, unspecified: Secondary | ICD-10-CM | POA: Diagnosis not present

## 2017-10-14 DIAGNOSIS — F332 Major depressive disorder, recurrent severe without psychotic features: Secondary | ICD-10-CM

## 2017-10-14 DIAGNOSIS — Z79899 Other long term (current) drug therapy: Secondary | ICD-10-CM | POA: Diagnosis not present

## 2017-10-14 DIAGNOSIS — E782 Mixed hyperlipidemia: Secondary | ICD-10-CM | POA: Diagnosis not present

## 2017-10-14 DIAGNOSIS — I10 Essential (primary) hypertension: Secondary | ICD-10-CM | POA: Diagnosis not present

## 2017-10-14 DIAGNOSIS — G4700 Insomnia, unspecified: Secondary | ICD-10-CM | POA: Diagnosis not present

## 2017-10-14 NOTE — Patient Instructions (Addendum)
RESTART SERTRALINE 25MG  DAILY FOR MOOD/DEPRESSION  You are up-to-date on MOST of your vaccines. Highly recommend you get Tdap (tetanus and pertussis) vaccine at your local pharmacy  Continue other medications as ordered  Follow up as scheduled with mental health provider  Check blood pressure daily at home. GOAL BLOOD PRESSURE <130/80  Follow up in 3 mos for MDD, hyperlipidemia, HTN, insomnia
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
## 2017-10-15 ENCOUNTER — Encounter: Payer: Self-pay | Admitting: Internal Medicine

## 2017-10-15 LAB — COMPLETE METABOLIC PANEL WITH GFR
AG RATIO: 2.1 (calc) (ref 1.0–2.5)
ALKALINE PHOSPHATASE (APISO): 52 U/L (ref 33–130)
ALT: 18 U/L (ref 6–29)
AST: 25 U/L (ref 10–35)
Albumin: 4.4 g/dL (ref 3.6–5.1)
BUN: 21 mg/dL (ref 7–25)
CO2: 29 mmol/L (ref 20–32)
Calcium: 9.7 mg/dL (ref 8.6–10.4)
Chloride: 103 mmol/L (ref 98–110)
Creat: 0.81 mg/dL (ref 0.60–0.93)
GFR, Est African American: 81 mL/min/{1.73_m2} (ref >=60)
GFR, Est Non African American: 70 mL/min/{1.73_m2} (ref >=60)
GLOBULIN: 2.1 g/dL (ref 1.9–3.7)
Glucose, Bld: 86 mg/dL (ref 65–139)
POTASSIUM: 4.6 mmol/L (ref 3.5–5.3)
SODIUM: 138 mmol/L (ref 135–146)
Total Bilirubin: 0.3 mg/dL (ref 0.2–1.2)
Total Protein: 6.5 g/dL (ref 6.1–8.1)

## 2017-10-15 LAB — LIPID PANEL
CHOL/HDL RATIO: 4.4 (calc) (ref <5.0)
Cholesterol: 296 mg/dL — ABNORMAL HIGH (ref <200)
HDL: 67 mg/dL (ref >50)
LDL Cholesterol (Calc): 205 mg/dL (calc) — ABNORMAL HIGH
NON-HDL CHOLESTEROL (CALC): 229 mg/dL — AB (ref <130)
TRIGLYCERIDES: 108 mg/dL (ref <150)

## 2017-10-22 ENCOUNTER — Other Ambulatory Visit: Payer: Self-pay | Admitting: Internal Medicine

## 2017-10-22 DIAGNOSIS — E785 Hyperlipidemia, unspecified: Secondary | ICD-10-CM

## 2017-11-02 ENCOUNTER — Encounter: Payer: Self-pay | Admitting: Physician Assistant

## 2017-11-03 DIAGNOSIS — F329 Major depressive disorder, single episode, unspecified: Secondary | ICD-10-CM | POA: Diagnosis not present

## 2017-12-01 DIAGNOSIS — H353131 Nonexudative age-related macular degeneration, bilateral, early dry stage: Secondary | ICD-10-CM | POA: Diagnosis not present

## 2017-12-01 DIAGNOSIS — H524 Presbyopia: Secondary | ICD-10-CM | POA: Diagnosis not present

## 2017-12-01 DIAGNOSIS — H04123 Dry eye syndrome of bilateral lacrimal glands: Secondary | ICD-10-CM | POA: Diagnosis not present

## 2017-12-01 DIAGNOSIS — Z961 Presence of intraocular lens: Secondary | ICD-10-CM | POA: Diagnosis not present

## 2017-12-01 DIAGNOSIS — F329 Major depressive disorder, single episode, unspecified: Secondary | ICD-10-CM | POA: Diagnosis not present

## 2017-12-29 ENCOUNTER — Encounter: Payer: Medicare Other | Admitting: Obstetrics & Gynecology

## 2018-01-05 DIAGNOSIS — D225 Melanocytic nevi of trunk: Secondary | ICD-10-CM | POA: Diagnosis not present

## 2018-01-05 DIAGNOSIS — D692 Other nonthrombocytopenic purpura: Secondary | ICD-10-CM | POA: Diagnosis not present

## 2018-01-05 DIAGNOSIS — L438 Other lichen planus: Secondary | ICD-10-CM | POA: Diagnosis not present

## 2018-01-05 DIAGNOSIS — L821 Other seborrheic keratosis: Secondary | ICD-10-CM | POA: Diagnosis not present

## 2018-01-05 DIAGNOSIS — Z85828 Personal history of other malignant neoplasm of skin: Secondary | ICD-10-CM | POA: Diagnosis not present

## 2018-01-05 DIAGNOSIS — D1801 Hemangioma of skin and subcutaneous tissue: Secondary | ICD-10-CM | POA: Diagnosis not present

## 2018-01-05 DIAGNOSIS — L814 Other melanin hyperpigmentation: Secondary | ICD-10-CM | POA: Diagnosis not present

## 2018-01-12 DIAGNOSIS — F329 Major depressive disorder, single episode, unspecified: Secondary | ICD-10-CM | POA: Diagnosis not present

## 2018-01-20 ENCOUNTER — Ambulatory Visit (INDEPENDENT_AMBULATORY_CARE_PROVIDER_SITE_OTHER): Payer: Medicare Other | Admitting: Internal Medicine

## 2018-01-20 ENCOUNTER — Encounter: Payer: Self-pay | Admitting: Internal Medicine

## 2018-01-20 VITALS — BP 142/80 | HR 61 | Temp 97.7°F | Ht 60.0 in | Wt 107.2 lb

## 2018-01-20 DIAGNOSIS — G4700 Insomnia, unspecified: Secondary | ICD-10-CM

## 2018-01-20 DIAGNOSIS — Z79899 Other long term (current) drug therapy: Secondary | ICD-10-CM | POA: Diagnosis not present

## 2018-01-20 DIAGNOSIS — I10 Essential (primary) hypertension: Secondary | ICD-10-CM | POA: Diagnosis not present

## 2018-01-20 DIAGNOSIS — F332 Major depressive disorder, recurrent severe without psychotic features: Secondary | ICD-10-CM

## 2018-01-20 DIAGNOSIS — E782 Mixed hyperlipidemia: Secondary | ICD-10-CM

## 2018-01-20 DIAGNOSIS — R300 Dysuria: Secondary | ICD-10-CM

## 2018-01-20 MED ORDER — TRAZODONE HCL 150 MG PO TABS: ORAL_TABLET | ORAL | 6 refills | Status: DC

## 2018-01-20 MED ORDER — SERTRALINE HCL 25 MG PO TABS
25.0000 mg | ORAL_TABLET | Freq: Every day | ORAL | 1 refills | Status: DC
Start: 2018-01-20 — End: 2018-10-17

## 2018-01-20 NOTE — Progress Notes (Signed)
Patient ID: Caroline Snyder, female   DOB: 07-31-1941, 77 y.o.   MRN: [id number]   Location:  [facility] OFFICE  Provider: DR Caroline Snyder  Code Status:  Goals of Care:  Advanced Directives 08/04/2017  Does Patient Have a Medical Advance Directive? No  Does patient want to make changes to medical advance directive? -  Copy of [place] in Chart? -  Would patient like information on creating a medical advance directive? -  Pre-existing out of facility DNR order (yellow form or pink MOST form) -     Chief Complaint  Patient presents with  . Medical Management of Chronic Issues    Patient here today alone and complains of not being able to stay asleep. She also complains of still dealing with some depression on and off..Patient is unclear which meds she should be on.     HPI: Patient is a 77 y.o. female seen today for medical management of chronic diseases.  She is noncompliant with medications for depression and takes them "as needed". She feels confused today and disconnected. No SI/HI. She has trouble sleeping at night with frequent awakenings. She continues to grieve for deceased son. She has gained 6 lbs since last ov  MDD - worse. followed by therapist. She has insomnia (difficulty staying asleep; excessively thinking about younger son Caroline Snyder who expired suddenly in [place] 07/14/16. He was her primary caregiver). No relief with trazodone, remeron or belsomra, although she is noncompliant with med and takes it prn. She was encouraged to make appt with psychiatrist in therapist office but unable to determine if she followed through with that.   HTN - suboptimally controlled with diet. Takes ASA daily  GERD - controlled on axid  Hyperlipidemia - uncontrolled on pravastatin '40mg'$ . LDL 205; Tchol 296; TG 108; HDL 67  Osteoporosis - stable on vitamin D3 2000 units daily  Vaginal atrophy - stable on estradiol. Zovirax helps dyspareunia   Past Medical History:  Diagnosis  Date  . Allergy   . 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 Bunion 07/01/2010  . Depression 05/05/2007  . External hemorrhoids without mention of complication 35/36/1443  . GERD (gastroesophageal reflux disease) 2000  . Hyperlipidemia 06/29/2006  . Memory loss 04/15/2003  . Migraine without aura, without mention of intractable migraine without mention of status migrainosus 03/24/1999  . Osteoporosis, unspecified 04/15/2004  . 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 Unspecified essential hypertension 11/22/2007    Past Surgical History:  Procedure Laterality Date  . COLONOSCOPY  06-16-2007   Internal hemorrhoids,laxity of anal sphincter,polyp at 25cm form anal verge, tortuous sigmoid colon. Dr.Orr   . EYE SURGERY Bilateral 2010   cataract Dr. Bing Plume  . VAGINAL HYSTERECTOMY  1980     reports that she has never smoked. She has never used smokeless tobacco. She reports that she does not drink alcohol or use drugs. Social History   Socioeconomic History  . Marital status: Widowed    Spouse name: Not on file  . Number of children: 2  . Years of education: Not on file  . Highest education level: Not on file  Occupational History  . Occupation: Retired  Scientific laboratory technician  . Financial resource strain: Not on file  . Food insecurity:    Worry: Not on file    Inability: Not on file  . Transportation needs:    Medical: Not on file    Non-medical: Not on file  Tobacco Use  . Smoking status: Never Smoker  .  Smokeless tobacco: Never Used  Substance and Sexual Activity  . Alcohol use: No    Alcohol/week: 0.0 oz  . Drug use: No  . Sexual activity: Not Currently  Lifestyle  . Physical activity:    Days per week: Not on file    Minutes per session: Not on file  . Stress: Not on file  Relationships  . Social connections:    Talks on phone: Not on file    Gets together: Not on file    Attends religious service: Not on file    Active member of club or organization: Not on  file    Attends meetings of clubs or organizations: Not on file    Relationship status: Not on file  . Intimate partner violence:    Fear of current or ex partner: Not on file    Emotionally abused: Not on file    Physically abused: Not on file    Forced sexual activity: Not on file  Other Topics Concern  . Not on file  Social History Narrative  . Not on file    Family History  Problem Relation Age of Onset  . Alzheimer's disease Mother   . Diabetes Mother   . Transient ischemic attack Mother   . Heart disease Mother        MI, CHF  . Alcohol abuse Father   . Kidney disease Father   . Hypertension Sister   . Hypertension Brother   . Heart disease Brother   . Hypertension Brother   . Hypertension Sister   . Hypertension Brother   . Other Son        truck accident 07/2016  . Colon cancer Neg Hx   . Stomach cancer Neg Hx     Allergies  Allergen Reactions  . Vioxx [Rofecoxib] Nausea Only    Pt. Can't remember what reaction was but asks to leave on her profile.     Outpatient Encounter Medications as of 01/20/2018  Medication Sig  . acyclovir (ZOVIRAX) 200 MG capsule One daily as needed to prevent vaginal discomfort related to viral infection.  Marland Kitchen aspirin 81 MG tablet Take 81 mg by mouth daily. Take 1 tablet daily to prevent heart attack, stroke and clotting.  . Cholecalciferol (HM VITAMIN D3) 2000 UNITS CAPS Take by mouth. Take 1 tablet daily  . Cranberry 250 MG TABS Take by mouth. Take 1 tablet daily  . cycloSPORINE (RESTASIS) 0.05 % ophthalmic emulsion 1 drop 2 (two) times daily.  . Multiple Vitamin (MULTI VITAMIN DAILY PO) Take by mouth. Centrum Silver for Women Take 1 tablet daily  . nizatidine (AXID) 150 MG capsule Take one tablet twice daily as needed to reduce stomach acid  . pravastatin (PRAVACHOL) 40 MG tablet TAKE 1 TABLET BY MOUTH ONCE EACH NIGHT TO LOWER CHOLESTEROL  . Probiotic Product (PROBIOTIC-10 PO) Take by mouth.  . sertraline (ZOLOFT) 25 MG tablet  Take 1 tablet (25 mg total) by mouth daily. For depression  . traZODone (DESYREL) 150 MG tablet One at bedtime for rest and depression  . estradiol (ESTRACE) 0.1 MG/GM vaginal cream Place 1 Applicatorful vaginally 2 (two) times a week. As needed  . [DISCONTINUED] HYDROcodone-acetaminophen (NORCO/VICODIN) 5-325 MG tablet TK 1 T PO Q 8 H PRF MODERATE PAIN   No facility-administered encounter medications on file as of 01/20/2018.     Review of Systems:  Review of Systems  Constitutional: Positive for unexpected weight change (gain).  Psychiatric/Behavioral: Positive for confusion, dysphoric mood and sleep  disturbance.  All other systems reviewed and are negative.   Health Maintenance  Topic Date Due  . PNA vac Low Risk Adult (2 of 2 - PPSV23) 05/28/2017  . TETANUS/TDAP  10/15/2018 (Originally 12/25/2015)  . INFLUENZA VACCINE  03/02/2018  . MAMMOGRAM  06/09/2018  . COLONOSCOPY  05/13/2022  . DEXA SCAN  Completed    Physical Exam: Vitals:   01/20/18 1158  BP: (!) 142/80  Pulse: 61  Temp: 97.7 F (36.5 C)  SpO2: 95%  Weight: 107 lb 3.2 oz (48.6 kg)  Height: 5' (1.524 m)   Body mass index is 20.94 kg/m. Physical Exam  Constitutional: She is oriented to person, place, and time. She appears well-developed and well-nourished.  HENT:  Mouth/Throat: Oropharynx is clear and moist. No oropharyngeal exudate.  MMM; no oral thrush  Eyes: Pupils are equal, round, and reactive to light. No scleral icterus.  Neck: Neck supple. Carotid bruit is not present. No tracheal deviation present. No thyromegaly present.  Cardiovascular: Normal rate, regular rhythm and intact distal pulses. Exam reveals no gallop and no friction rub.  No murmur heard. No LE edema b/l. no calf TTP.   Pulmonary/Chest: Effort normal and breath sounds normal. No stridor. No respiratory distress. She has no wheezes. She has no rales.  Abdominal: Soft. Normal appearance and bowel sounds are normal. She exhibits no  distension and no mass. There is no hepatomegaly. There is no tenderness. There is no rigidity, no rebound and no guarding. No hernia.  Lymphadenopathy:    She has no cervical adenopathy.  Neurological: She is alert and oriented to person, place, and time. She has normal reflexes.  Skin: Skin is warm and dry. No rash noted.  Psychiatric: Her behavior is normal. Judgment and thought content normal. She exhibits a depressed mood.    Labs reviewed: Basic Metabolic Panel: Recent Labs    03/11/17 1127 06/17/17 1256 08/04/17 1412 10/14/17 1014  NA  --  141 138 138  K  --  4.7 4.5 4.6  CL  --  104 103 103  CO2  --  '29 28 29  '$ GLUCOSE  --  85 88 86  BUN  --  '18 13 21  '$ CREATININE  --  0.83 0.83 0.81  CALCIUM  --  9.6 9.9 9.7  TSH 2.05  --   --   --    Liver Function Tests: Recent Labs    01/29/17 1126 06/17/17 1256 10/14/17 1014  AST 26  --  25  ALT '17 15 18  '$ ALKPHOS 66  --   --   BILITOT 0.3  --  0.3  PROT 7.5  --  6.5  ALBUMIN 4.7  --   --    No results for input(s): LIPASE, AMYLASE in the last 8760 hours. No results for input(s): AMMONIA in the last 8760 hours. CBC: No results for input(s): WBC, NEUTROABS, HGB, HCT, MCV, PLT in the last 8760 hours. Lipid Panel: Recent Labs    03/11/17 1127 06/17/17 1256 10/14/17 1014  CHOL 266* 253* 296*  HDL 81 73 67  LDLCALC 149* 149* 205*  TRIG 178* 178* 108  CHOLHDL 3.3 3.5 4.4   No results found for: HGBA1C  Procedures since last visit: No results found.  Assessment/Plan   ICD-10-CM   1. Mixed hyperlipidemia E78.2 Lipid Panel  2. Severe episode of recurrent major depressive disorder, without psychotic features ([place]) F33.2 traZODone (DESYREL) 150 MG tablet    sertraline (ZOLOFT) 25 MG tablet  CMP with eGFR([facility])    CBC with Differential/Platelets  3. Essential hypertension I10 CMP with eGFR([facility])    CBC with Differential/Platelets  4. Insomnia, unspecified type G47.00   5. High risk medication use Z79.899 CMP  with eGFR([facility])    CBC with Differential/Platelets  6. Dysuria R30.0 Urinalysis with Reflex Microscopic    PLEASE START SERTRALINE (ZOLOFT) AND TRAZODONE FOR DEPRESSION AND INSOMNIA  Continue other medications as ordered  Will call with lab results  Recommend you make an appointment with psychiatry for your mental health medication management  Continue to see therapist as scheduled  Follow up in 3 mos for MDD, HTN, hyperlipidemia    Caroline Lampson S. Perlie Gold  [facility] [address] [place], [place] [address] [phone] Cell (Monday-Friday 8 AM - 5 PM) [phone] After 5 PM and follow prompts

## 2018-01-20 NOTE — Patient Instructions (Signed)
PLEASE START SERTRALINE (ZOLOFT) AND TRAZODONE FOR DEPRESSION AND INSOMNIA  Continue other medications as ordered  Will call with lab results  Recommend you make an appointment with psychiatry for your mental health medication management  Continue to see therapist as scheduled  Follow up in 3 mos for MDD, HTN, hyperlipidemia

## 2018-01-21 LAB — CBC WITH DIFFERENTIAL/PLATELET
BASOS PCT: 1.2 %
Basophils Absolute: 70 cells/uL (ref 0–200)
EOS PCT: 2.1 %
Eosinophils Absolute: 122 cells/uL (ref 15–500)
HCT: 36.9 % (ref 35.0–45.0)
HEMOGLOBIN: 13 g/dL (ref 11.7–15.5)
Lymphs Abs: 1485 cells/uL (ref 850–3900)
MCH: 31.2 pg (ref 27.0–33.0)
MCHC: 35.2 g/dL (ref 32.0–36.0)
MCV: 88.5 fL (ref 80.0–100.0)
MONOS PCT: 8.2 %
MPV: 10.4 fL (ref 7.5–12.5)
NEUTROS ABS: 3648 {cells}/uL (ref 1500–7800)
Neutrophils Relative %: 62.9 %
Platelets: 275 10*3/uL (ref 140–400)
RBC: 4.17 10*6/uL (ref 3.80–5.10)
RDW: 11.8 % (ref 11.0–15.0)
Total Lymphocyte: 25.6 %
WBC mixed population: 476 cells/uL (ref 200–950)
WBC: 5.8 10*3/uL (ref 3.8–10.8)

## 2018-01-21 LAB — COMPLETE METABOLIC PANEL WITH GFR
AG RATIO: 1.7 (calc) (ref 1.0–2.5)
ALKALINE PHOSPHATASE (APISO): 53 U/L (ref 33–130)
ALT: 13 U/L (ref 6–29)
AST: 20 U/L (ref 10–35)
Albumin: 4.3 g/dL (ref 3.6–5.1)
BUN: 18 mg/dL (ref 7–25)
CO2: 30 mmol/L (ref 20–32)
Calcium: 10 mg/dL (ref 8.6–10.4)
Chloride: 103 mmol/L (ref 98–110)
Creat: 0.89 mg/dL (ref 0.60–0.93)
GFR, Est African American: 72 mL/min/{1.73_m2} (ref >=60)
GFR, Est Non African American: 62 mL/min/{1.73_m2} (ref >=60)
Globulin: 2.5 g/dL (calc) (ref 1.9–3.7)
Glucose, Bld: 96 mg/dL (ref 65–139)
Potassium: 4.7 mmol/L (ref 3.5–5.3)
SODIUM: 140 mmol/L (ref 135–146)
Total Bilirubin: 0.4 mg/dL (ref 0.2–1.2)
Total Protein: 6.8 g/dL (ref 6.1–8.1)

## 2018-01-21 LAB — URINALYSIS, ROUTINE W REFLEX MICROSCOPIC
Bilirubin Urine: NEGATIVE
Glucose, UA: NEGATIVE
Hgb urine dipstick: NEGATIVE
Ketones, ur: NEGATIVE
LEUKOCYTES UA: NEGATIVE
NITRITE: NEGATIVE
PH: 6 (ref 5.0–8.0)
Protein, ur: NEGATIVE
SPECIFIC GRAVITY, URINE: 1.008 (ref 1.001–1.03)

## 2018-01-21 LAB — LIPID PANEL
CHOL/HDL RATIO: 4.2 (calc) (ref <5.0)
CHOLESTEROL: 255 mg/dL — AB (ref <200)
HDL: 61 mg/dL (ref >50)
LDL CHOLESTEROL (CALC): 155 mg/dL — AB
NON-HDL CHOLESTEROL (CALC): 194 mg/dL — AB (ref <130)
Triglycerides: 220 mg/dL — ABNORMAL HIGH (ref <150)

## 2018-02-01 ENCOUNTER — Other Ambulatory Visit: Payer: Self-pay | Admitting: Internal Medicine

## 2018-02-01 DIAGNOSIS — E785 Hyperlipidemia, unspecified: Secondary | ICD-10-CM

## 2018-02-09 DIAGNOSIS — F329 Major depressive disorder, single episode, unspecified: Secondary | ICD-10-CM | POA: Diagnosis not present

## 2018-02-11 ENCOUNTER — Other Ambulatory Visit: Payer: Self-pay | Admitting: Internal Medicine

## 2018-02-11 DIAGNOSIS — F332 Major depressive disorder, recurrent severe without psychotic features: Secondary | ICD-10-CM

## 2018-02-13 NOTE — Telephone Encounter (Signed)
Pharmacy is requesting to change the prescription from # 30 with 6 refills to #90 with 3 refills. Request has been pended to provider for approval.

## 2018-03-22 ENCOUNTER — Encounter: Payer: Self-pay | Admitting: Internal Medicine

## 2018-03-23 DIAGNOSIS — F329 Major depressive disorder, single episode, unspecified: Secondary | ICD-10-CM | POA: Diagnosis not present

## 2018-04-13 DIAGNOSIS — F329 Major depressive disorder, single episode, unspecified: Secondary | ICD-10-CM | POA: Diagnosis not present

## 2018-04-20 ENCOUNTER — Encounter: Payer: Self-pay | Admitting: Obstetrics & Gynecology

## 2018-04-20 ENCOUNTER — Ambulatory Visit (INDEPENDENT_AMBULATORY_CARE_PROVIDER_SITE_OTHER): Payer: Medicare Other | Admitting: Obstetrics & Gynecology

## 2018-04-20 VITALS — BP 146/90 | Ht 60.0 in | Wt 105.0 lb

## 2018-04-20 DIAGNOSIS — N952 Postmenopausal atrophic vaginitis: Secondary | ICD-10-CM

## 2018-04-20 DIAGNOSIS — Z90710 Acquired absence of both cervix and uterus: Secondary | ICD-10-CM

## 2018-04-20 DIAGNOSIS — M810 Age-related osteoporosis without current pathological fracture: Secondary | ICD-10-CM | POA: Diagnosis not present

## 2018-04-20 DIAGNOSIS — Z780 Asymptomatic menopausal state: Secondary | ICD-10-CM

## 2018-04-20 DIAGNOSIS — F321 Major depressive disorder, single episode, moderate: Secondary | ICD-10-CM

## 2018-04-20 DIAGNOSIS — Z90722 Acquired absence of ovaries, bilateral: Secondary | ICD-10-CM

## 2018-04-20 DIAGNOSIS — Z9079 Acquired absence of other genital organ(s): Secondary | ICD-10-CM

## 2018-04-20 DIAGNOSIS — Z01419 Encounter for gynecological examination (general) (routine) without abnormal findings: Secondary | ICD-10-CM

## 2018-04-20 NOTE — Progress Notes (Signed)
Caroline Snyder 02-Nov-1940 [id number]   History:    77 y.o. G2P2L1 Widowed.  Youngest son died almost 2 yrs ago.  Still difficult mourning.  Sees her oldest son, who has children.    RP:  Established patient presenting for annual gyn exam   HPI: S/P TAH/BSO.  Menopause, on no HRT.  No PMB.  No Pelvic pain.  Abstinent.  Urine/BMs wnl.  Breasts wnl.  BMI 20.51.  Crying during her visit.  Says that cries every night.  No suicidal ideation.  On Desyrel and Zoloft.  Health labs with Fam MD.  Past medical history,surgical history, family history and social history were all reviewed and documented in the EPIC chart.  Gynecologic History No LMP recorded. Patient has had a hysterectomy. Contraception: Hysterectomy Last Pap: 2011. Results were: Negative Last mammogram: 06/2017. Results were: Benign Bone Density: 01/2016 Osteoporosis T-Score -2.5 Colonoscopy: 05/2017  Obstetric History OB History  Gravida Para Term Preterm AB Living  2 2       2   SAB TAB Ectopic Multiple Live Births          2    # Outcome Date GA Lbr Len/2nd Weight Sex Delivery Anes PTL Lv  2 Para     M Vag-Spont   LIV  1 Para     M Vag-Spont   LIV    Obstetric Comments  1 son passed away in a car accident in 2017.      ROS: A ROS was performed and pertinent positives and negatives are included in the history.  GENERAL: No fevers or chills. HEENT: No change in vision, no earache, sore throat or sinus congestion. NECK: No pain or stiffness. CARDIOVASCULAR: No chest pain or pressure. No palpitations. PULMONARY: No shortness of breath, cough or wheeze. GASTROINTESTINAL: No abdominal pain, nausea, vomiting or diarrhea, melena or bright red blood per rectum. GENITOURINARY: No urinary frequency, urgency, hesitancy or dysuria. MUSCULOSKELETAL: No joint or muscle pain, no back pain, no recent trauma. DERMATOLOGIC: No rash, no itching, no lesions. ENDOCRINE: No polyuria, polydipsia, no heat or cold intolerance. No recent change  in weight. HEMATOLOGICAL: No anemia or easy bruising or bleeding. NEUROLOGIC: No headache, seizures, numbness, tingling or weakness. PSYCHIATRIC: No depression, no loss of interest in normal activity or change in sleep pattern.     Exam:   BP (!) 146/90   Ht 5' (1.524 m)   Wt 105 lb (47.6 kg)   BMI 20.51 kg/m   Body mass index is 20.51 kg/m.  General appearance : Well developed well nourished female. No acute distress HEENT: Eyes: no retinal hemorrhage or exudates,  Neck supple, trachea midline, no carotid bruits, no thyroidmegaly Lungs: Clear to auscultation, no rhonchi or wheezes, or rib retractions  Heart: Regular rate and rhythm, no murmurs or gallops Breast:Examined in sitting and supine position were symmetrical in appearance, no palpable masses or tenderness,  no skin retraction, no nipple inversion, no nipple discharge, no skin discoloration, no axillary or supraclavicular lymphadenopathy Abdomen: no palpable masses or tenderness, no rebound or guarding Extremities: no edema or skin discoloration or tenderness  Pelvic: Vulva: Normal             Vagina: No gross lesions or discharge  Cervix/Uterus absent  Adnexa  Without masses or tenderness  Anus: Normal   Assessment/Plan:  77 y.o. female for annual exam   1. Encounter for well woman exam with routine gynecological exam Normal gynecologic exam status post hysterectomy in menopause.  No  indication to do a Pap test today.  Breast exam normal.  Last screening mammogram November 2018 was benign.  Colonoscopy October 2018.  Health labs with family physician.  Recommend aerobic physical activity 5 times a week and weightlifting every 2 days.  2. S/P TAH-BSO  3. Postmenopause Well on no hormone replacement therapy.  4. Post-menopausal atrophic vaginitis Patient is abstinent.  Recommend using only Replens moisturizer as needed.  5. Age-related osteoporosis without current pathological fracture Continue with vitamin D  supplements, calcium intake of 1.5 g/day including nutrition and supplement, weightbearing physical activity regularly recommended.  Patient will follow-up for a bone density. - DG Bone Density; Future  6. Current moderate episode of major depressive disorder, unspecified whether recurrent ([place]) Depression associated with the demise of her son, as she is very sad and cries often.  No suicidal ideation.  Recommend f/u with Fam MD to adjust treatment.  Counseling on above issues and coordination of care more than 50% for 15 minutes.  Princess Bruins MD, 11:50 AM 04/20/2018

## 2018-04-22 ENCOUNTER — Encounter: Payer: Self-pay | Admitting: Obstetrics & Gynecology

## 2018-04-22 NOTE — Patient Instructions (Signed)
1. Encounter for well woman exam with routine gynecological exam Normal gynecologic exam status post hysterectomy in menopause.  No indication to do a Pap test today.  Breast exam normal.  Last screening mammogram November 2018 was benign.  Colonoscopy October 2018.  Health labs with family physician.  Recommend aerobic physical activity 5 times a week and weightlifting every 2 days.  2. S/P TAH-BSO  3. Postmenopause Well on no hormone replacement therapy.  4. Post-menopausal atrophic vaginitis Patient is abstinent.  Recommend using only Replens moisturizer as needed.  5. Age-related osteoporosis without current pathological fracture Continue with vitamin D supplements, calcium intake of 1.5 g/day including nutrition and supplement, weightbearing physical activity regularly recommended.  Patient will follow-up for a bone density. - DG Bone Density; Future  6. Current moderate episode of major depressive disorder, unspecified whether recurrent ([place]) Depression associated with the demise of her son, as she is very sad and cries often.  No suicidal ideation.  Recommend f/u with Fam MD to adjust treatment.  Benjamine Mola, it was a pleasure meeting you today!

## 2018-05-05 ENCOUNTER — Telehealth: Payer: Self-pay

## 2018-05-05 NOTE — Telephone Encounter (Signed)
Called patient to try to schedule AWV in the office. No answer-left voicemail to call back.    

## 2018-05-11 ENCOUNTER — Ambulatory Visit (INDEPENDENT_AMBULATORY_CARE_PROVIDER_SITE_OTHER): Payer: Medicare Other

## 2018-05-11 DIAGNOSIS — M810 Age-related osteoporosis without current pathological fracture: Secondary | ICD-10-CM

## 2018-05-18 ENCOUNTER — Telehealth: Payer: Self-pay | Admitting: *Deleted

## 2018-05-18 NOTE — Telephone Encounter (Signed)
Patient informed with the below.  

## 2018-05-18 NOTE — Telephone Encounter (Signed)
-----   Message from Princess Bruins, MD sent at 05/14/2018  9:24 PM EDT ----- Regarding: Bone Density results BD:  Osteopenia.  Continue with Vit D, Ca++ 1.5 g/d, regular weightbearing physical activity.

## 2018-06-09 ENCOUNTER — Ambulatory Visit (INDEPENDENT_AMBULATORY_CARE_PROVIDER_SITE_OTHER): Payer: Medicare Other

## 2018-06-09 ENCOUNTER — Telehealth: Payer: Self-pay | Admitting: *Deleted

## 2018-06-09 VITALS — BP 160/76 | HR 70 | Temp 98.0°F | Ht 60.0 in | Wt 104.0 lb

## 2018-06-09 DIAGNOSIS — Z0000 Encounter for general adult medical examination without abnormal findings: Secondary | ICD-10-CM | POA: Diagnosis not present

## 2018-06-09 DIAGNOSIS — K2100 Gastro-esophageal reflux disease with esophagitis, without bleeding: Secondary | ICD-10-CM

## 2018-06-09 DIAGNOSIS — Z23 Encounter for immunization: Secondary | ICD-10-CM

## 2018-06-09 MED ORDER — TETANUS-DIPHTH-ACELL PERTUSSIS 5-2.5-18.5 LF-MCG/0.5 IM SUSP: 0.5000 mL | Freq: Once | INTRAMUSCULAR | 0 refills | Status: AC

## 2018-06-09 MED ORDER — ZOSTER VAC RECOMB ADJUVANTED 50 MCG/0.5ML IM SUSR: 0.5000 mL | Freq: Once | INTRAMUSCULAR | 1 refills | Status: AC

## 2018-06-09 MED ORDER — NIZATIDINE 150 MG PO CAPS: ORAL_CAPSULE | ORAL | 4 refills | Status: DC

## 2018-06-09 NOTE — Patient Instructions (Addendum)
Caroline Snyder , Thank you for taking time to come for your Medicare Wellness Visit. I appreciate your ongoing commitment to your health goals. Please review the following plan we discussed and let me know if I can assist you in the future.   Screening recommendations/referrals: Colonoscopy excluded, over age 78 Mammogram due, please make appointment Bone Density up to date Recommended yearly ophthalmology/optometry visit for glaucoma screening and checkup Recommended yearly dental visit for hygiene and checkup  Vaccinations: Influenza vaccine high dose given today Pneumococcal vaccine 23 given today, up to date, completed Tdap vaccine due, ordered to pharmacy. Please wait at least a month from today Shingles vaccine due, ordered to pharmacy. Please wait at least a month from today  Advanced directives: Advance directive discussed with you today. I have provided a copy for you to complete at home and have notarized. Once this is complete please bring a copy in to our office so we can scan it into your chart.  Conditions/risks identified: none  Next appointment: Sherrie Mustache, NP 07/18/2018 @ 10:15am          Tyson Dense, RN 06/11/2019 @ 11am   Preventive Care 65 Years and Older, Female Preventive care refers to lifestyle choices and visits with your health care provider that can promote health and wellness. What does preventive care include?  A yearly physical exam. This is also called an annual well check.  Dental exams once or twice a year.  Routine eye exams. Ask your health care provider how often you should have your eyes checked.  Personal lifestyle choices, including:  Daily care of your teeth and gums.  Regular physical activity.  Eating a healthy diet.  Avoiding tobacco and drug use.  Limiting alcohol use.  Practicing safe sex.  Taking low-dose aspirin every day.  Taking vitamin and mineral supplements as recommended by your health care provider. What  happens during an annual well check? The services and screenings done by your health care provider during your annual well check will depend on your age, overall health, lifestyle risk factors, and family history of disease. Counseling  Your health care provider may ask you questions about your:  Alcohol use.  Tobacco use.  Drug use.  Emotional well-being.  Home and relationship well-being.  Sexual activity.  Eating habits.  History of falls.  Memory and ability to understand (cognition).  Work and work Statistician.  Reproductive health. Screening  You may have the following tests or measurements:  Height, weight, and BMI.  Blood pressure.  Lipid and cholesterol levels. These may be checked every 5 years, or more frequently if you are over 27 years old.  Skin check.  Lung cancer screening. You may have this screening every year starting at age 59 if you have a 30-pack-year history of smoking and currently smoke or have quit within the past 15 years.  Fecal occult blood test (FOBT) of the stool. You may have this test every year starting at age 45.  Flexible sigmoidoscopy or colonoscopy. You may have a sigmoidoscopy every 5 years or a colonoscopy every 10 years starting at age 49.  Hepatitis C blood test.  Hepatitis B blood test.  Sexually transmitted disease (STD) testing.  Diabetes screening. This is done by checking your blood sugar (glucose) after you have not eaten for a while (fasting). You may have this done every 1-3 years.  Bone density scan. This is done to screen for osteoporosis. You may have this done starting at age 51.  Mammogram.  This may be done every 1-2 years. Talk to your health care provider about how often you should have regular mammograms. Talk with your health care provider about your test results, treatment options, and if necessary, the need for more tests. Vaccines  Your health care provider may recommend certain vaccines, such  as:  Influenza vaccine. This is recommended every year.  Tetanus, diphtheria, and acellular pertussis (Tdap, Td) vaccine. You may need a Td booster every 10 years.  Zoster vaccine. You may need this after age 50.  Pneumococcal 13-valent conjugate (PCV13) vaccine. One dose is recommended after age 22.  Pneumococcal polysaccharide (PPSV23) vaccine. One dose is recommended after age 52. Talk to your health care provider about which screenings and vaccines you need and how often you need them. This information is not intended to replace advice given to you by your health care provider. Make sure you discuss any questions you have with your health care provider. Document Released: 08/15/2015 Document Revised: 04/07/2016 Document Reviewed: 05/20/2015 Elsevier Interactive Patient Education  2017 [place] Prevention in the Home Falls can cause injuries. They can happen to people of all ages. There are many things you can do to make your home safe and to help prevent falls. What can I do on the outside of my home?  Regularly fix the edges of walkways and driveways and fix any cracks.  Remove anything that might make you trip as you walk through a door, such as a raised step or threshold.  Trim any bushes or trees on the path to your home.  Use bright outdoor lighting.  Clear any walking paths of anything that might make someone trip, such as rocks or tools.  Regularly check to see if handrails are loose or broken. Make sure that both sides of any steps have handrails.  Any raised decks and porches should have guardrails on the edges.  Have any leaves, snow, or ice cleared regularly.  Use sand or salt on walking paths during winter.  Clean up any spills in your garage right away. This includes oil or grease spills. What can I do in the bathroom?  Use night lights.  Install grab bars by the toilet and in the tub and shower. Do not use towel bars as grab bars.  Use  non-skid mats or decals in the tub or shower.  If you need to sit down in the shower, use a plastic, non-slip stool.  Keep the floor dry. Clean up any water that spills on the floor as soon as it happens.  Remove soap buildup in the tub or shower regularly.  Attach bath mats securely with double-sided non-slip rug tape.  Do not have throw rugs and other things on the floor that can make you trip. What can I do in the bedroom?  Use night lights.  Make sure that you have a light by your bed that is easy to reach.  Do not use any sheets or blankets that are too big for your bed. They should not hang down onto the floor.  Have a firm chair that has side arms. You can use this for support while you get dressed.  Do not have throw rugs and other things on the floor that can make you trip. What can I do in the kitchen?  Clean up any spills right away.  Avoid walking on wet floors.  Keep items that you use a lot in easy-to-reach places.  If you need to reach something  above you, use a strong step stool that has a grab bar.  Keep electrical cords out of the way.  Do not use floor polish or wax that makes floors slippery. If you must use wax, use non-skid floor wax.  Do not have throw rugs and other things on the floor that can make you trip. What can I do with my stairs?  Do not leave any items on the stairs.  Make sure that there are handrails on both sides of the stairs and use them. Fix handrails that are broken or loose. Make sure that handrails are as long as the stairways.  Check any carpeting to make sure that it is firmly attached to the stairs. Fix any carpet that is loose or worn.  Avoid having throw rugs at the top or bottom of the stairs. If you do have throw rugs, attach them to the floor with carpet tape.  Make sure that you have a light switch at the top of the stairs and the bottom of the stairs. If you do not have them, ask someone to add them for you. What  else can I do to help prevent falls?  Wear shoes that:  Do not have high heels.  Have rubber bottoms.  Are comfortable and fit you well.  Are closed at the toe. Do not wear sandals.  If you use a stepladder:  Make sure that it is fully opened. Do not climb a closed stepladder.  Make sure that both sides of the stepladder are locked into place.  Ask someone to hold it for you, if possible.  Clearly mark and make sure that you can see:  Any grab bars or handrails.  First and last steps.  Where the edge of each step is.  Use tools that help you move around (mobility aids) if they are needed. These include:  Canes.  Walkers.  Scooters.  Crutches.  Turn on the lights when you go into a dark area. Replace any light bulbs as soon as they burn out.  Set up your furniture so you have a clear path. Avoid moving your furniture around.  If any of your floors are uneven, fix them.  If there are any pets around you, be aware of where they are.  Review your medicines with your doctor. Some medicines can make you feel dizzy. This can increase your chance of falling. Ask your doctor what other things that you can do to help prevent falls. This information is not intended to replace advice given to you by your health care provider. Make sure you discuss any questions you have with your health care provider. Document Released: 05/15/2009 Document Revised: 12/25/2015 Document Reviewed: 08/23/2014 Elsevier Interactive Patient Education  2017 [employer].

## 2018-06-09 NOTE — Progress Notes (Signed)
Subjective:   Caroline Snyder is a 77 y.o. female who presents for Medicare Annual (Subsequent) preventive examination.  Last AWV-06/02/2017       Objective:     Vitals: BP (!) 160/76 (BP Location: Left Arm, Patient Position: Sitting)   Pulse 70   Temp 98 F (36.7 C) (Oral)   Ht 5' (1.524 m)   Wt 104 lb (47.2 kg)   SpO2 97%   BMI 20.31 kg/m   Body mass index is 20.31 kg/m.  Advanced Directives 06/09/2018 08/04/2017 06/02/2017 05/13/2017 04/28/2017 01/29/2017 01/20/2017  Does Patient Have a Medical Advance Directive? No No No No No No No  Does patient want to make changes to medical advance directive? - - - - - - -  Copy of [place] in Chart? - - - - - - -  Would patient like information on creating a medical advance directive? Yes (MAU/Ambulatory/Procedural Areas - Information given) - Yes (MAU/Ambulatory/Procedural Areas - Information given) No - Patient declined - - -  Pre-existing out of facility DNR order (yellow form or pink MOST form) - - - - - - -    Tobacco Social History   Tobacco Use  Smoking Status Never Smoker  Smokeless Tobacco Never Used     Counseling given: Not Answered   Clinical Intake:  Pre-visit preparation completed: No  Pain : No/denies pain     Diabetes: No  How often do you need to have someone help you when you read instructions, pamphlets, or other written materials from your doctor or pharmacy?: 1 - Never What is the last grade level you completed in school?: HS  Interpreter Needed?: No  Information entered by :: Tyson Dense, RN  Past Medical History:  Diagnosis Date  . Allergy   . 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 Bunion 07/01/2010  . Depression 05/05/2007  . External hemorrhoids without mention of complication 25/12/3974  . GERD (gastroesophageal reflux disease) 2000  . Hyperlipidemia 06/29/2006  . Memory loss 04/15/2003  . Migraine without aura, without mention of intractable migraine without  mention of status migrainosus 03/24/1999  . Osteoporosis, unspecified 04/15/2004  . 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 Unspecified essential hypertension 11/22/2007   Past Surgical History:  Procedure Laterality Date  . COLONOSCOPY  06-16-2007   Internal hemorrhoids,laxity of anal sphincter,polyp at 25cm form anal verge, tortuous sigmoid colon. Dr.Orr   . EYE SURGERY Bilateral 2010   cataract Dr. Bing Plume  . VAGINAL HYSTERECTOMY  1980   Family History  Problem Relation Age of Onset  . Alzheimer's disease Mother   . Diabetes Mother   . Transient ischemic attack Mother   . Heart disease Mother        MI, CHF  . Alcohol abuse Father   . Kidney disease Father   . Hypertension Sister   . Hypertension Brother   . Heart disease Brother   . Hypertension Brother   . Hypertension Sister   . Hypertension Brother   . Other Son        truck accident 07/2016  . Colon cancer Neg Hx   . Stomach cancer Neg Hx    Social History   Socioeconomic History  . Marital status: Widowed    Spouse name: Not on file  . Number of children: 2  . Years of education: Not on file  . Highest education level: Not on file  Occupational History  . Occupation: Retired  Scientific laboratory technician  . Emergency planning/management officer  strain: Not hard at all  . Food insecurity:    Worry: Never true    Inability: Never true  . Transportation needs:    Medical: No    Non-medical: No  Tobacco Use  . Smoking status: Never Smoker  . Smokeless tobacco: Never Used  Substance and Sexual Activity  . Alcohol use: No    Alcohol/week: 0.0 standard drinks  . Drug use: No  . Sexual activity: Not Currently    Comment: 1st intercourse- 16, partners- 2,   Lifestyle  . Physical activity:    Days per week: 1 day    Minutes per session: 60 min  . Stress: Not at all  Relationships  . Social connections:    Talks on phone: More than three times a week    Gets together: More than three times a week    Attends religious service:  More than 4 times per year    Active member of club or organization: No    Attends meetings of clubs or organizations: Never    Relationship status: Widowed  Other Topics Concern  . Not on file  Social History Narrative  . Not on file    Outpatient Encounter Medications as of 06/09/2018  Medication Sig  . acyclovir (ZOVIRAX) 200 MG capsule One daily as needed to prevent vaginal discomfort related to viral infection.  Marland Kitchen aspirin 81 MG tablet Take 81 mg by mouth daily. Take 1 tablet daily to prevent heart attack, stroke and clotting.  . Cholecalciferol (HM VITAMIN D3) 2000 UNITS CAPS Take by mouth. Take 1 tablet daily  . Cranberry 250 MG TABS Take by mouth. Take 1 tablet daily  . cycloSPORINE (RESTASIS) 0.05 % ophthalmic emulsion 1 drop 2 (two) times daily.  Marland Kitchen estradiol (ESTRACE) 0.1 MG/GM vaginal cream Place 1 Applicatorful vaginally 2 (two) times a week. As needed  . Multiple Vitamin (MULTI VITAMIN DAILY PO) Take by mouth. Centrum Silver for Women Take 1 tablet daily  . nizatidine (AXID) 150 MG capsule Take one tablet twice daily as needed to reduce stomach acid  . pravastatin (PRAVACHOL) 40 MG tablet TAKE 1 TABLET BY MOUTH DAILY TO LOWER CHOLESTEROL  . Probiotic Product (PROBIOTIC-10 PO) Take by mouth.  . sertraline (ZOLOFT) 25 MG tablet Take 1 tablet (25 mg total) by mouth daily. For depression  . Tdap (BOOSTRIX) 5-2.5-18.5 LF-MCG/0.5 injection Inject 0.5 mLs into the muscle once for 1 dose.  . traZODone (DESYREL) 150 MG tablet Take by mouth at bedtime as needed for sleep.  Marland Kitchen Zoster Vaccine Adjuvanted [facility]) injection Inject 0.5 mLs into the muscle once for 1 dose.  . [DISCONTINUED] nizatidine (AXID) 150 MG capsule Take one tablet twice daily as needed to reduce stomach acid  . [DISCONTINUED] Tdap (BOOSTRIX) 5-2.5-18.5 LF-MCG/0.5 injection Inject 0.5 mLs into the muscle once.  . [DISCONTINUED] Zoster Vaccine Adjuvanted [facility]) injection Inject 0.5 mLs into the muscle once.  .  traZODone (DESYREL) 150 MG tablet ONE AT BEDTIME FOR REST AND DEPRESSION   No facility-administered encounter medications on file as of 06/09/2018.     Activities of Daily Living In your present state of health, do you have any difficulty performing the following activities: 06/09/2018  Hearing? N  Vision? N  Difficulty concentrating or making decisions? N  Walking or climbing stairs? N  Dressing or bathing? N  Doing errands, shopping? N  Preparing Food and eating ? N  Using the Toilet? N  In the past six months, have you accidently leaked urine? N  Do you have problems with loss of bowel control? N  Managing your Medications? N  Managing your Finances? N  Housekeeping or managing your Housekeeping? N  Some recent data might be hidden    Patient Care Team: Gildardo Cranker, DO as PCP - General (Internal Medicine) [name], MD as Consulting Physician (Ophthalmology) Calvert Cantor, MD as Consulting Physician (Ophthalmology) Terrance Mass, MD as Consulting Physician (Gynecology)    Assessment:   This is a routine wellness examination for [place].  Exercise Activities and Dietary recommendations Current Exercise Habits: Home exercise routine, Type of exercise: Other - see comments(dancing), Time (Minutes): 60, Frequency (Times/Week): 1, Weekly Exercise (Minutes/Week): 60, Intensity: Mild, Exercise limited by: None identified  Goals    . Increase water intake     Starting 05/28/16, I will attempt to increase water intake and get at least 5 cups of water per day.     . Self balance     Patient will work on balance in life with activities, exercise, etc       Fall Risk Fall Risk  06/09/2018 01/20/2018 10/14/2017 08/04/2017 06/17/2017  Falls in the past year? 0 No No No No  Number falls in past yr: 0 - - - -  Injury with Fall? 0 - - - -   Is the patient's home free of loose throw rugs in walkways, pet beds, electrical cords, etc?   yes      Grab bars in the bathroom?  yes      Handrails on the stairs?   yes      Adequate lighting?   yes  Depression Screen PHQ 2/9 Scores 06/09/2018 06/02/2017 02/03/2017 01/29/2017  PHQ - 2 Score 0 1 2 0  PHQ- 9 Score - - 7 -  Exception Documentation - - - Patient refusal  Not completed - - - -     Cognitive Function MMSE - Mini Mental State Exam 06/09/2018 06/02/2017 05/28/2016  Orientation to time 5 4 5   Orientation to Place 5 5 5   Registration 3 3 3   Attention/ Calculation 5 5 5   Recall 0 0 2  Language- name 2 objects 2 2 2   Language- repeat 1 1 1   Language- follow 3 step command 3 3 3   Language- read & follow direction 1 1 1   Write a sentence 1 1 1   Copy design 1 1 1   Total score 27 26 29         Immunization History  Administered Date(s) Administered  . Influenza, High Dose Seasonal PF 06/09/2018  . Influenza,inj,Quad PF,6+ Mos 07/19/2013, 05/28/2016, 06/02/2017  . Influenza-Unspecified 05/23/2015  . PPD Test 11/30/2013, 05/30/2015  . Pneumococcal Conjugate-13 05/28/2016  . Pneumococcal Polysaccharide-23 06/09/2018  . Zoster 11/19/2009    Qualifies for Shingles Vaccine? Yes, educated and ordered to pharmacy  Screening Tests Health Maintenance  Topic Date Due  . MAMMOGRAM  06/09/2018  . TETANUS/TDAP  10/15/2018 (Originally 12/25/2015)  . COLONOSCOPY  05/13/2022  . INFLUENZA VACCINE  Completed  . DEXA SCAN  Completed  . PNA vac Low Risk Adult  Completed    Cancer Screenings: Lung: Low Dose CT Chest recommended if Age 22-80 years, 30 pack-year currently smoking OR have quit w/in 15years. Patient does not qualify. Breast:  Up to date on Mammogram? No, patient said she would make next appointment   Up to date of Bone Density/Dexa? Yes Colorectal: up to date  Additional Screenings:  Hepatitis C Screening: declined High dose flu vaccine due: given today Pneumovax  due: given today TDAP due: prescription sent to pharmacy    Plan:    I have personally reviewed and addressed the Medicare Annual  Wellness questionnaire and have noted the following in the patient's chart:  A. Medical and social history B. Use of alcohol, tobacco or illicit drugs  C. Current medications and supplements D. Functional ability and status E.  Nutritional status F.  Physical activity G. Advance directives H. List of other physicians I.  Hospitalizations, surgeries, and ER visits in previous 12 months J.  [place] to include hearing, vision, cognitive, depression L. Referrals and appointments - none  In addition, I have reviewed and discussed with patient certain preventive protocols, quality metrics, and best practice recommendations. A written personalized care plan for preventive services as well as general preventive health recommendations were provided to patient.  See attached scanned questionnaire for additional information.   Signed,   Tyson Dense, RN Nurse Health Advisor  Patient Concerns: Not sleep well and waking up 4-5 times a night. Trazodone is not working

## 2018-06-09 NOTE — Telephone Encounter (Signed)
Patient called and stated that she received 2 injections today. Pnuemonia and Flu. Stated that her left arm is hurting really bad. Has a knot and warmth to touch but no redness.   LD-HD Influenza RD-Pneumococcal  Patient stated that she was going to ice it and take Tylenol when she gets home. Will call back if worsening symptoms or any new.

## 2018-06-14 ENCOUNTER — Telehealth: Payer: Self-pay | Admitting: *Deleted

## 2018-06-14 NOTE — Telephone Encounter (Signed)
Received Prior Authorization Request from [facility] for Nizatidine. Called #[phone] and Spoke with Ace and initiated prior Authorization. Went into determination.  ID: [id number]

## 2018-06-15 NOTE — Telephone Encounter (Signed)
Received fax from [place] with additional clinical information. Placed in [place] folder to review. To be faxed back to [facility] Fax:[phone]

## 2018-06-20 ENCOUNTER — Telehealth: Payer: Self-pay

## 2018-06-20 MED ORDER — FAMOTIDINE 20 MG PO TABS: 20.0000 mg | ORAL_TABLET | Freq: Two times a day (BID) | ORAL | 1 refills | Status: DC

## 2018-06-20 NOTE — Telephone Encounter (Signed)
Received fax from [place] that Nizatidine cap 150mg   Requires a failure, contraindication or intolerance medication before insurance will cover it .Per Sherrie Mustache patient should try Famotidine 20 mg BID for Jerrye Bushy.   Call placed to patient she is aware of the medication change

## 2018-07-11 ENCOUNTER — Encounter: Payer: Self-pay | Admitting: Nurse Practitioner

## 2018-07-11 ENCOUNTER — Ambulatory Visit (INDEPENDENT_AMBULATORY_CARE_PROVIDER_SITE_OTHER): Payer: Medicare Other | Admitting: Nurse Practitioner

## 2018-07-11 VITALS — BP 142/84 | HR 74 | Temp 98.4°F | Ht 60.0 in | Wt 103.0 lb

## 2018-07-11 DIAGNOSIS — F339 Major depressive disorder, recurrent, unspecified: Secondary | ICD-10-CM

## 2018-07-11 DIAGNOSIS — R197 Diarrhea, unspecified: Secondary | ICD-10-CM

## 2018-07-11 NOTE — Progress Notes (Signed)
Careteam: Patient Care Team: [name], NP as PCP - General (Geriatric Medicine) [name], MD as Consulting Physician (Ophthalmology) Calvert Cantor, MD as Consulting Physician (Ophthalmology) Terrance Mass, MD (Inactive) as Consulting Physician (Gynecology)  Advanced Directive information    Allergies  Allergen Reactions  . Vioxx [Rofecoxib] Nausea Only    Pt. Can't remember what reaction was but asks to leave on her profile.     Chief Complaint  Patient presents with  . Acute Visit    Pt has been having watery diarrhea for several weeks. Pt is having 4 to 5 episodes of dirrhea during the day and at night.      HPI: Patient is a 77 y.o. female seen in the office today due to ongoing diarrhea for several weeks. No changes in medication or diet.  Reports "yucky" stomach for several weeks. Does feel sick or nausea, no vomiting. Still eating.  No GERD.  No weight loss.  No abdominal pain.  No recent antibiotics.  Reports stools are liquid and brown and yellow.   Very depressed - unsure if she is taking her zoloft  Review of Systems:  Review of Systems  Constitutional: Negative for chills and fever.  Respiratory: Negative for sputum production and shortness of breath.   Cardiovascular: Negative for chest pain.  Gastrointestinal: Positive for diarrhea and nausea. Negative for abdominal pain, blood in stool, constipation, heartburn, melena and vomiting.  Genitourinary: Positive for frequency (at night worse). Negative for dysuria.  Musculoskeletal: Negative for myalgias.    Past Medical History:  Diagnosis Date  . Allergy   . 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 Bunion 07/01/2010  . Depression 05/05/2007  . External hemorrhoids without mention of complication 73/42/8768  . GERD (gastroesophageal reflux disease) 2000  . Hyperlipidemia 06/29/2006  . Memory loss 04/15/2003  . Migraine without aura, without mention of intractable migraine without  mention of status migrainosus 03/24/1999  . Osteoporosis, unspecified 04/15/2004  . 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 Unspecified essential hypertension 11/22/2007   Past Surgical History:  Procedure Laterality Date  . COLONOSCOPY  06-16-2007   Internal hemorrhoids,laxity of anal sphincter,polyp at 25cm form anal verge, tortuous sigmoid colon. Dr.Orr   . EYE SURGERY Bilateral 2010   cataract Dr. Bing Plume  . VAGINAL HYSTERECTOMY  1980   Social History:   reports that she has never smoked. She has never used smokeless tobacco. She reports that she does not drink alcohol or use drugs.  Family History  Problem Relation Age of Onset  . Alzheimer's disease Mother   . Diabetes Mother   . Transient ischemic attack Mother   . Heart disease Mother        MI, CHF  . Alcohol abuse Father   . Kidney disease Father   . Hypertension Sister   . Hypertension Brother   . Heart disease Brother   . Hypertension Brother   . Hypertension Sister   . Hypertension Brother   . Other Son        truck accident 07/2016  . Colon cancer Neg Hx   . Stomach cancer Neg Hx     Medications: Patient's Medications  New Prescriptions   No medications on file  Previous Medications   ASPIRIN 81 MG TABLET    Take 81 mg by mouth daily. Take 1 tablet daily to prevent heart attack, stroke and clotting.   CHOLECALCIFEROL (HM VITAMIN D3) 2000 UNITS CAPS    Take by mouth. Take 1  tablet daily   CRANBERRY 250 MG TABS    Take by mouth. Take 1 tablet daily   CYCLOSPORINE (RESTASIS) 0.05 % OPHTHALMIC EMULSION    1 drop 2 (two) times daily.   FAMOTIDINE (PEPCID) 20 MG TABLET    Take 1 tablet (20 mg total) by mouth 2 (two) times daily.   MULTIPLE VITAMIN (MULTI VITAMIN DAILY PO)    Take by mouth. Centrum Silver for Women Take 1 tablet daily   PRAVASTATIN (PRAVACHOL) 40 MG TABLET    TAKE 1 TABLET BY MOUTH DAILY TO LOWER CHOLESTEROL   PROBIOTIC PRODUCT (PROBIOTIC-10 PO)    Take by mouth.   SERTRALINE  (ZOLOFT) 25 MG TABLET    Take 1 tablet (25 mg total) by mouth daily. For depression   TRAZODONE (DESYREL) 150 MG TABLET    ONE AT BEDTIME FOR REST AND DEPRESSION   TRAZODONE (DESYREL) 150 MG TABLET    Take by mouth at bedtime as needed for sleep.  Modified Medications   No medications on file  Discontinued Medications   ACYCLOVIR (ZOVIRAX) 200 MG CAPSULE    One daily as needed to prevent vaginal discomfort related to viral infection.   ESTRADIOL (ESTRACE) 0.1 MG/GM VAGINAL CREAM    Place 1 Applicatorful vaginally 2 (two) times a week. As needed     Physical Exam:  Vitals:   07/11/18 1551  Pulse: 74  Temp: 98.4 F (36.9 C)  TempSrc: Oral  SpO2: 98%  Weight: 103 lb (46.7 kg)  Height: 5' (1.524 m)   Body mass index is 20.12 kg/m.  Physical Exam  Constitutional: She is oriented to person, place, and time. She appears well-developed and well-nourished.  HENT:  Mouth/Throat: Oropharynx is clear and moist. No oropharyngeal exudate.  MMM; no oral thrush  Eyes: Pupils are equal, round, and reactive to light. No scleral icterus.  Neck: Neck supple. Carotid bruit is not present. No tracheal deviation present. No thyromegaly present.  Cardiovascular: Normal rate, regular rhythm and intact distal pulses. Exam reveals no gallop and no friction rub.  No murmur heard. No LE edema b/l. no calf TTP.   Pulmonary/Chest: Effort normal and breath sounds normal. No stridor. No respiratory distress. She has no wheezes. She has no rales.  Abdominal: Soft. Normal appearance and bowel sounds are normal. She exhibits no distension and no mass. There is no hepatomegaly. There is no tenderness. There is no rigidity, no rebound and no guarding. No hernia.  Lymphadenopathy:    She has no cervical adenopathy.  Neurological: She is alert and oriented to person, place, and time. She has normal reflexes.  Skin: Skin is warm and dry. No rash noted.  Psychiatric: Her behavior is normal. Judgment and thought  content normal. She exhibits a depressed mood.    Labs reviewed: Basic Metabolic Panel: Recent Labs    08/04/17 1412 10/14/17 1014 01/20/18 1234  NA 138 138 140  K 4.5 4.6 4.7  CL 103 103 103  CO2 28 29 30   GLUCOSE 88 86 96  BUN 13 21 18   CREATININE 0.83 0.81 0.89  CALCIUM 9.9 9.7 10.0   Liver Function Tests: Recent Labs    10/14/17 1014 01/20/18 1234  AST 25 20  ALT 18 13  BILITOT 0.3 0.4  PROT 6.5 6.8   No results for input(s): LIPASE, AMYLASE in the last 8760 hours. No results for input(s): AMMONIA in the last 8760 hours. CBC: Recent Labs    01/20/18 1234  WBC 5.8  NEUTROABS 3,648  HGB 13.0  HCT 36.9  MCV 88.5  PLT 275   Lipid Panel: Recent Labs    10/14/17 1014 01/20/18 1234  CHOL 296* 255*  HDL 67 61  LDLCALC 205* 155*  TRIG 108 220*  CHOLHDL 4.4 4.2   TSH: No results for input(s): TSH in the last 8760 hours. A1C: No results found for: HGBA1C   Assessment/Plan 1. Diarrhea, unspecified type -without abdominal pain or tenderness. -to increase probiotic to twice daily, will get blood work and stool sample at this time.  ?if stress is related to this. Reports she has had a few more normal stools since this has started.  - COMPLETE METABOLIC PANEL WITH GFR - CBC with Differential/Platelets - Amylase - Lipase - Clostridium difficile Toxin B, Qualitative, Real-Time PCR([facility]); Future - Ova and Parasite Exam; Future - Stool culture; Future  2. Depression, recurrent [facility]) Anniversary of her son's death which has been very hard on her. Unsure what she is taking. Encouraged her to check her medication at home with the list we have provided. Grief counseling encouraged.    Next appt: 07/18/2018 Carlos American. [place], [facility] [phone]

## 2018-07-11 NOTE — Patient Instructions (Addendum)
Make sure you are taking your zoloft and trazodone for depression  To increase probiotic to twice daily  To make sure you are not taking any stool softeners or laxatives   To bring stool samples back to office

## 2018-07-12 ENCOUNTER — Telehealth: Payer: Self-pay | Admitting: *Deleted

## 2018-07-12 ENCOUNTER — Other Ambulatory Visit: Payer: Self-pay

## 2018-07-12 DIAGNOSIS — R197 Diarrhea, unspecified: Secondary | ICD-10-CM | POA: Diagnosis not present

## 2018-07-12 LAB — CBC WITH DIFFERENTIAL/PLATELET
Basophils Absolute: 50 cells/uL (ref 0–200)
Basophils Relative: 0.9 %
Eosinophils Absolute: 112 cells/uL (ref 15–500)
Eosinophils Relative: 2 %
HCT: 37.5 % (ref 35.0–45.0)
Hemoglobin: 12.6 g/dL (ref 11.7–15.5)
Lymphs Abs: 1350 cells/uL (ref 850–3900)
MCH: 30.6 pg (ref 27.0–33.0)
MCHC: 33.6 g/dL (ref 32.0–36.0)
MCV: 91 fL (ref 80.0–100.0)
MPV: 11.3 fL (ref 7.5–12.5)
Monocytes Relative: 7.4 %
NEUTROS ABS: 3674 {cells}/uL (ref 1500–7800)
Neutrophils Relative %: 65.6 %
Platelets: 286 10*3/uL (ref 140–400)
RBC: 4.12 10*6/uL (ref 3.80–5.10)
RDW: 12 % (ref 11.0–15.0)
Total Lymphocyte: 24.1 %
WBC mixed population: 414 cells/uL (ref 200–950)
WBC: 5.6 10*3/uL (ref 3.8–10.8)

## 2018-07-12 LAB — COMPLETE METABOLIC PANEL WITH GFR
AG Ratio: 1.8 (calc) (ref 1.0–2.5)
ALT: 12 U/L (ref 6–29)
AST: 19 U/L (ref 10–35)
Albumin: 4.2 g/dL (ref 3.6–5.1)
Alkaline phosphatase (APISO): 53 U/L (ref 33–130)
BUN: 19 mg/dL (ref 7–25)
CO2: 26 mmol/L (ref 20–32)
CREATININE: 0.81 mg/dL (ref 0.60–0.93)
Calcium: 9.9 mg/dL (ref 8.6–10.4)
Chloride: 106 mmol/L (ref 98–110)
GFR, Est African American: 81 mL/min/{1.73_m2} (ref >=60)
GFR, Est Non African American: 70 mL/min/{1.73_m2} (ref >=60)
Globulin: 2.4 g/dL (calc) (ref 1.9–3.7)
Glucose, Bld: 112 mg/dL (ref 65–139)
Potassium: 4 mmol/L (ref 3.5–5.3)
Sodium: 142 mmol/L (ref 135–146)
Total Bilirubin: 0.2 mg/dL (ref 0.2–1.2)
Total Protein: 6.6 g/dL (ref 6.1–8.1)

## 2018-07-12 LAB — AMYLASE: AMYLASE: 87 U/L (ref 21–101)

## 2018-07-12 LAB — LIPASE: Lipase: 22 U/L (ref 7–60)

## 2018-07-12 NOTE — Telephone Encounter (Signed)
Stool was formed therefore not able to send out for C Diff but we will send for culture and ova and para to see if this results anything. She was previously on a medication for acid reflux but has not been taking. She could try to take omeprazole OTC daily and see if this helps symptoms.

## 2018-07-12 NOTE — Telephone Encounter (Signed)
Patient dropped off her stool sample. Stated that her stomach was still feel "yucky" and feels alittle Nauseated. Stated that her stomach hurts, not cramping but hurts. I asked her to explain the "hurt" and she just stated feels "yucky".   Patient stated that her stool this morning wasn't diarrhea, it was "cakey"   Please Advise.

## 2018-07-12 NOTE — Telephone Encounter (Signed)
Patient notified and agreed.  

## 2018-07-14 DIAGNOSIS — F329 Major depressive disorder, single episode, unspecified: Secondary | ICD-10-CM | POA: Diagnosis not present

## 2018-07-17 ENCOUNTER — Ambulatory Visit: Payer: Medicare Other | Admitting: Nurse Practitioner

## 2018-07-18 ENCOUNTER — Encounter: Payer: Self-pay | Admitting: Nurse Practitioner

## 2018-07-18 ENCOUNTER — Ambulatory Visit (INDEPENDENT_AMBULATORY_CARE_PROVIDER_SITE_OTHER): Payer: Medicare Other | Admitting: Nurse Practitioner

## 2018-07-18 VITALS — BP 148/90 | HR 73 | Temp 98.0°F | Ht 60.0 in | Wt 102.0 lb

## 2018-07-18 DIAGNOSIS — R413 Other amnesia: Secondary | ICD-10-CM

## 2018-07-18 DIAGNOSIS — F332 Major depressive disorder, recurrent severe without psychotic features: Secondary | ICD-10-CM | POA: Diagnosis not present

## 2018-07-18 DIAGNOSIS — R197 Diarrhea, unspecified: Secondary | ICD-10-CM | POA: Diagnosis not present

## 2018-07-18 DIAGNOSIS — E782 Mixed hyperlipidemia: Secondary | ICD-10-CM | POA: Diagnosis not present

## 2018-07-18 DIAGNOSIS — I10 Essential (primary) hypertension: Secondary | ICD-10-CM

## 2018-07-18 LAB — OVA AND PARASITE EXAMINATION
CONCENTRATE RESULT:: NONE SEEN
MICRO NUMBER:: 91483684
SPECIMEN QUALITY:: ADEQUATE
TRICHROME RESULT:: NONE SEEN

## 2018-07-18 LAB — STOOL CULTURE
MICRO NUMBER: 91483706
MICRO NUMBER: 91483708
MICRO NUMBER:: 91483707
SHIGA RESULT:: NOT DETECTED
SPECIMEN QUALITY:: ADEQUATE
SPECIMEN QUALITY:: ADEQUATE
SPECIMEN QUALITY:: ADEQUATE

## 2018-07-18 MED ORDER — TRAZODONE HCL 50 MG PO TABS: 50.0000 mg | ORAL_TABLET | Freq: Every day | ORAL | 1 refills | Status: DC

## 2018-07-18 MED ORDER — ZOSTER VAC RECOMB ADJUVANTED 50 MCG/0.5ML IM SUSR: 0.5000 mL | Freq: Once | INTRAMUSCULAR | 1 refills | Status: AC

## 2018-07-18 NOTE — Progress Notes (Signed)
Careteam: Patient Care Team: [name], NP as PCP - General (Geriatric Medicine) [name], MD as Consulting Physician (Ophthalmology) Calvert Cantor, MD as Consulting Physician (Ophthalmology) Terrance Mass, MD (Inactive) as Consulting Physician (Gynecology)  Advanced Directive information    Allergies  Allergen Reactions  . Vioxx [Rofecoxib] Nausea Only    Pt. Can't remember what reaction was but asks to leave on her profile.     Chief Complaint  Patient presents with  . Medical Management of Chronic Issues    6 month follow-up on blood pressure, stomach issues, and memory (requesting aricept)   . Medication Management    Had all pill bottles except trazodone and zoloft, patient takes both as needed although zoloft is written to take daily   . Screening    Positive depression screening (19)      HPI: Patient is a 77 y.o. female seen in the office today for routine follow up.  Reports blood pressure at home consistent with here in office, staying in the 140s/80s.  Does not cook, eats a lot of processed food.  Eating a lot of deli meat and chips.    Depression- not controlled, was going to counseling due to loss of her son but has mixed up appts. Sister and best friend over memory problems.   Diarrhea is improving but did not increase probiotic   Depression screen [facility] 2/9 07/18/2018 06/09/2018 06/02/2017 02/03/2017 01/29/2017  Decreased Interest 1 0 0 1 0  Down, Depressed, Hopeless 3 0 1 1 0  PHQ - 2 Score 4 0 1 2 0  Altered sleeping 3 - - 1 -  Tired, decreased energy 3 - - 1 -  Change in appetite 2 - - 1 -  Feeling bad or failure about yourself  2 - - 0 -  Trouble concentrating 2 - - 2 -  Moving slowly or fidgety/restless 3 - - 0 -  Suicidal thoughts 0 - - 0 -  PHQ-9 Score 19 - - 7 -  Difficult doing work/chores - - - Somewhat difficult -    MMSE - Mini Mental State Exam 06/09/2018 06/02/2017 05/28/2016  Orientation to time 5 4 5   Orientation to  Place 5 5 5   Registration 3 3 3   Attention/ Calculation 5 5 5   Recall 0 0 2  Language- name 2 objects 2 2 2   Language- repeat 1 1 1   Language- follow 3 step command 3 3 3   Language- read & follow direction 1 1 1   Write a sentence 1 1 1   Copy design 1 1 1   Total score 27 26 29     Review of Systems:  Review of Systems  Constitutional: Negative for chills, fever, malaise/fatigue and weight loss.  Respiratory: Negative for shortness of breath.   Cardiovascular: Negative for chest pain.  Gastrointestinal: Positive for diarrhea.  Musculoskeletal: Positive for joint pain and myalgias. Negative for falls.  Psychiatric/Behavioral: Positive for depression and memory loss. The patient is nervous/anxious and has insomnia.     Past Medical History:  Diagnosis Date  . Allergy   . 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 Bunion 07/01/2010  . Depression 05/05/2007  . External hemorrhoids without mention of complication 91/47/8295  . GERD (gastroesophageal reflux disease) 2000  . Hyperlipidemia 06/29/2006  . Memory loss 04/15/2003  . Migraine without aura, without mention of intractable migraine without mention of status migrainosus 03/24/1999  . Osteoporosis, unspecified 04/15/2004  . 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  Unspecified essential hypertension 11/22/2007   Past Surgical History:  Procedure Laterality Date  . COLONOSCOPY  06-16-2007   Internal hemorrhoids,laxity of anal sphincter,polyp at 25cm form anal verge, tortuous sigmoid colon. Dr.Orr   . EYE SURGERY Bilateral 2010   cataract Dr. Bing Plume  . VAGINAL HYSTERECTOMY  1980   Social History:   reports that she has never smoked. She has never used smokeless tobacco. She reports that she does not drink alcohol or use drugs.  Family History  Problem Relation Age of Onset  . Alzheimer's disease Mother   . Diabetes Mother   . Transient ischemic attack Mother   . Heart disease Mother        MI, CHF  . Alcohol  abuse Father   . Kidney disease Father   . Hypertension Sister   . Hypertension Brother   . Heart disease Brother   . Hypertension Brother   . Hypertension Sister   . Hypertension Brother   . Other Son        truck accident 07/2016  . Colon cancer Neg Hx   . Stomach cancer Neg Hx     Medications: Patient's Medications  New Prescriptions   No medications on file  Previous Medications   ASCORBIC ACID (VITAMIN C) 1000 MG TABLET    Take 1,000 mg by mouth daily.   ASPIRIN 81 MG TABLET    Take 81 mg by mouth daily. Take 1 tablet daily to prevent heart attack, stroke and clotting.   CALCIUM-MAGNESIUM-VITAMIN D (CALCIUM 1200+D3 PO)    Take by mouth daily.   CHOLECALCIFEROL (HM VITAMIN D3) 2000 UNITS CAPS    Take by mouth. Take 1 tablet daily   CINNAMON PO    Take 1,000 mg by mouth daily.   CRANBERRY-VITAMIN C-VITAMIN E (CRANBERRY PLUS VITAMIN C) 4200-20-3 MG-MG-UNIT CAPS    Take by mouth daily.   CYCLOSPORINE (RESTASIS) 0.05 % OPHTHALMIC EMULSION    1 drop 2 (two) times daily.   MELATONIN 3 MG CAPS    Take by mouth as needed.   MULTIPLE VITAMIN (MULTI VITAMIN DAILY PO)    Take by mouth. Centrum Silver for Women Take 1 tablet daily   MULTIPLE VITAMINS-MINERALS (ICAPS AREDS FORMULA PO)    Take by mouth 2 (two) times daily.   OMEPRAZOLE (PRILOSEC) 20 MG CAPSULE    Take 20 mg by mouth daily.   PRAVASTATIN (PRAVACHOL) 40 MG TABLET    TAKE 1 TABLET BY MOUTH DAILY TO LOWER CHOLESTEROL   PROBIOTIC PRODUCT (PROBIOTIC-10 PO)    Take by mouth.   SERTRALINE (ZOLOFT) 25 MG TABLET    Take 1 tablet (25 mg total) by mouth daily. For depression   TRAZODONE (DESYREL) 150 MG TABLET    ONE AT BEDTIME FOR REST AND DEPRESSION   ZOSTER VACCINE ADJUVANTED (SHINGRIX) INJECTION    Inject 0.5 mLs into the muscle once.  Modified Medications   No medications on file  Discontinued Medications   CRANBERRY 250 MG TABS    Take by mouth. Take 1 tablet daily     Physical Exam:  Vitals:   07/18/18 1022  BP: (!)  148/90  Pulse: 73  Temp: 98 F (36.7 C)  TempSrc: Oral  SpO2: 97%  Weight: 102 lb (46.3 kg)  Height: 5' (1.524 m)   Body mass index is 19.92 kg/m.  Physical Exam Constitutional:      Appearance: Normal appearance. She is well-developed.  HENT:     Mouth/Throat:     Pharynx:  No oropharyngeal exudate.  Eyes:     General: No scleral icterus.    Pupils: Pupils are equal, round, and reactive to light.  Neck:     Musculoskeletal: Neck supple.     Thyroid: No thyromegaly.     Vascular: No carotid bruit.     Trachea: No tracheal deviation.  Cardiovascular:     Rate and Rhythm: Normal rate and regular rhythm.     Heart sounds: No murmur. No friction rub. No gallop.      Comments: No LE edema b/l. no calf TTP.  Pulmonary:     Effort: Pulmonary effort is normal. No respiratory distress.     Breath sounds: Normal breath sounds. No stridor. No wheezing or rales.  Abdominal:     General: Bowel sounds are normal. There is no distension.     Palpations: Abdomen is soft. Abdomen is not rigid. There is no hepatomegaly or mass.     Tenderness: There is no abdominal tenderness. There is no guarding or rebound.     Hernia: No hernia is present.  Lymphadenopathy:     Cervical: No cervical adenopathy.  Skin:    General: Skin is warm and dry.     Findings: No rash.  Neurological:     Mental Status: She is alert and oriented to person, place, and time.     Deep Tendon Reflexes: Reflexes are normal and symmetric.  Psychiatric:        Mood and Affect: Mood is depressed.        Behavior: Behavior normal.        Thought Content: Thought content normal.        Judgment: Judgment normal.     Labs reviewed: Basic Metabolic Panel: Recent Labs    10/14/17 1014 01/20/18 1234 07/11/18 1616  NA 138 140 142  K 4.6 4.7 4.0  CL 103 103 106  CO2 29 30 26   GLUCOSE 86 96 112  BUN 21 18 19   CREATININE 0.81 0.89 0.81  CALCIUM 9.7 10.0 9.9   Liver Function Tests: Recent Labs     10/14/17 1014 01/20/18 1234 07/11/18 1616  AST 25 20 19   ALT 18 13 12   BILITOT 0.3 0.4 0.2  PROT 6.5 6.8 6.6   Recent Labs    07/11/18 1616  LIPASE 22  AMYLASE 87   No results for input(s): AMMONIA in the last 8760 hours. CBC: Recent Labs    01/20/18 1234 07/11/18 1616  WBC 5.8 5.6  NEUTROABS 3,648 3,674  HGB 13.0 12.6  HCT 36.9 37.5  MCV 88.5 91.0  PLT 275 286   Lipid Panel: Recent Labs    10/14/17 1014 01/20/18 1234  CHOL 296* 255*  HDL 67 61  LDLCALC 205* 155*  TRIG 108 220*  CHOLHDL 4.4 4.2   TSH: No results for input(s): TSH in the last 8760 hours. A1C: No results found for: HGBA1C   Assessment/Plan 1. Essential hypertension -uncontrolled, eating at lot of processed foods. Education provided for this and she is willing to change diet before starting medication.   2. Severe episode of recurrent major depressive disorder, without psychotic features ([place]) -positive screening of 19, discussed counseling (has missed appts) and taking her medication routinely.  To stop taking zoloft and trazodone PRN -will start trazodone 50 mg by mouth at bedtime routinely at this time.  -also discussed a "positive thought" journal   - traZODone (DESYREL) 50 MG tablet; Take 1 tablet (50 mg total) by mouth at bedtime.  Dispense: 30 tablet; Refill: 1  3. Diarrhea, unspecified type Has improved but still soft. To increase probiotic to twice daily.   4. Mixed hyperlipidemia LDL elevated, will have fasting lab at next OV, continues on pravastatin but may need changes in medication if still elevated.   5. Memory loss MMSE 27/30 in November, pt has noted some memory loss and that she has a hard time remember, she has positive depression screen. Memory loss could be exacerbated  due to depression/greif, will treat first and follow.  Next appt: 1 month htn, depression  Shalom Ware K. [place], [facility] [phone]

## 2018-07-18 NOTE — Patient Instructions (Addendum)
1) To increase probiotic to TWICE DAILY  2)You have to take your depression medication every day for it to work Start taking TRAZODONE 50 mg by mouth every NIGHT for mood  STOP ZOLOFT (sertraline) for now, we may restart this later  3) write it notebook- spend time daily thinking of 5 things you are thankful/grateful for or that make you happy and write them down.  4) decrease sodium in your diet- see DASH Diet below  FOLLOW UP IN 1 month on blood pressure and mood-- make sure you make appt early and are fasting, we will get blood work that day    DASH Eating Plan DASH stands for "Dietary Approaches to Stop Hypertension." The DASH eating plan is a healthy eating plan that has been shown to reduce high blood pressure (hypertension). It may also reduce your risk for type 2 diabetes, heart disease, and stroke. The DASH eating plan may also help with weight loss. What are tips for following this plan? General guidelines  Avoid eating more than 2,300 mg (milligrams) of salt (sodium) a day. If you have hypertension, you may need to reduce your sodium intake to 1,500 mg a day.  Limit alcohol intake to no more than 1 drink a day for nonpregnant women and 2 drinks a day for men. One drink equals 12 oz of beer, 5 oz of wine, or 1 oz of hard liquor.  Work with your health care provider to maintain a healthy body weight or to lose weight. Ask what an ideal weight is for you.  Get at least 30 minutes of exercise that causes your heart to beat faster (aerobic exercise) most days of the week. Activities may include walking, swimming, or biking.  Work with your health care provider or diet and nutrition specialist (dietitian) to adjust your eating plan to your individual calorie needs. Reading food labels  Check food labels for the amount of sodium per serving. Choose foods with less than 5 percent of the Daily Value of sodium. Generally, foods with less than 300 mg of sodium per serving fit into this  eating plan.  To find whole grains, look for the word "whole" as the first word in the ingredient list. Shopping  Buy products labeled as "low-sodium" or "no salt added."  Buy fresh foods. Avoid canned foods and premade or frozen meals. Cooking  Avoid adding salt when cooking. Use salt-free seasonings or herbs instead of table salt or sea salt. Check with your health care provider or pharmacist before using salt substitutes.  Do not fry foods. Cook foods using healthy methods such as baking, boiling, grilling, and broiling instead.  Cook with heart-healthy oils, such as olive, canola, soybean, or sunflower oil. Meal planning   Eat a balanced diet that includes: ? 5 or more servings of fruits and vegetables each day. At each meal, try to fill half of your plate with fruits and vegetables. ? Up to 6-8 servings of whole grains each day. ? Less than 6 oz of lean meat, poultry, or fish each day. A 3-oz serving of meat is about the same size as a deck of cards. One egg equals 1 oz. ? 2 servings of low-fat dairy each day. ? A serving of nuts, seeds, or beans 5 times each week. ? Heart-healthy fats. Healthy fats called Omega-3 fatty acids are found in foods such as flaxseeds and coldwater fish, like sardines, salmon, and mackerel.  Limit how much you eat of the following: ? Canned or prepackaged  foods. ? Food that is high in trans fat, such as fried foods. ? Food that is high in saturated fat, such as fatty meat. ? Sweets, desserts, sugary drinks, and other foods with added sugar. ? Full-fat dairy products.  Do not salt foods before eating.  Try to eat at least 2 vegetarian meals each week.  Eat more home-cooked food and less restaurant, buffet, and fast food.  When eating at a restaurant, ask that your food be prepared with less salt or no salt, if possible. What foods are recommended? The items listed may not be a complete list. Talk with your dietitian about what dietary choices  are best for you. Grains Whole-grain or whole-wheat bread. Whole-grain or whole-wheat pasta. Brown rice. [place]. Bulgur. Whole-grain and low-sodium cereals. Pita bread. Low-fat, low-sodium crackers. Whole-wheat flour tortillas. Vegetables Fresh or frozen vegetables (raw, steamed, roasted, or grilled). Low-sodium or reduced-sodium tomato and vegetable juice. Low-sodium or reduced-sodium tomato sauce and tomato paste. Low-sodium or reduced-sodium canned vegetables. Fruits All fresh, dried, or frozen fruit. Canned fruit in natural juice (without added sugar). Meat and other protein foods Skinless chicken or [place]. Ground chicken or [place]. Pork with fat trimmed off. Fish and seafood. Egg whites. Dried beans, peas, or lentils. Unsalted nuts, nut butters, and seeds. Unsalted canned beans. Lean cuts of beef with fat trimmed off. Low-sodium, lean deli meat. Dairy Low-fat (1%) or fat-free (skim) milk. Fat-free, low-fat, or reduced-fat cheeses. Nonfat, low-sodium ricotta or cottage cheese. Low-fat or nonfat yogurt. Low-fat, low-sodium cheese. Fats and oils Soft margarine without trans fats. Vegetable oil. Low-fat, reduced-fat, or light mayonnaise and salad dressings (reduced-sodium). Canola, safflower, olive, soybean, and sunflower oils. Avocado. Seasoning and other foods Herbs. Spices. Seasoning mixes without salt. Unsalted popcorn and pretzels. Fat-free sweets. What foods are not recommended? The items listed may not be a complete list. Talk with your dietitian about what dietary choices are best for you. Grains Baked goods made with fat, such as croissants, muffins, or some breads. Dry pasta or rice meal packs. Vegetables Creamed or fried vegetables. Vegetables in a cheese sauce. Regular canned vegetables (not low-sodium or reduced-sodium). Regular canned tomato sauce and paste (not low-sodium or reduced-sodium). Regular tomato and vegetable juice (not low-sodium or reduced-sodium). Angie Fava.  Olives. Fruits Canned fruit in a light or heavy syrup. Fried fruit. Fruit in cream or butter sauce. Meat and other protein foods Fatty cuts of meat. Ribs. Fried meat. Berniece Salines. Sausage. Bologna and other processed lunch meats. Salami. Fatback. Hotdogs. Bratwurst. Salted nuts and seeds. Canned beans with added salt. Canned or smoked fish. Whole eggs or egg yolks. Chicken or [place] with skin. Dairy Whole or 2% milk, cream, and half-and-half. Whole or full-fat cream cheese. Whole-fat or sweetened yogurt. Full-fat cheese. Nondairy creamers. Whipped toppings. Processed cheese and cheese spreads. Fats and oils Butter. Stick margarine. Lard. Shortening. Ghee. Bacon fat. Tropical oils, such as coconut, palm kernel, or palm oil. Seasoning and other foods Salted popcorn and pretzels. Onion salt, garlic salt, seasoned salt, table salt, and sea salt. Worcestershire sauce. Tartar sauce. Barbecue sauce. Teriyaki sauce. Soy sauce, including reduced-sodium. Steak sauce. Canned and packaged gravies. Fish sauce. Oyster sauce. Cocktail sauce. Horseradish that you find on the shelf. Ketchup. Mustard. Meat flavorings and tenderizers. Bouillon cubes. Hot sauce and Tabasco sauce. Premade or packaged marinades. Premade or packaged taco seasonings. Relishes. Regular salad dressings. Where to find more information:  National Heart, Lung, and [place]: https://wilson-eaton.com/  American Heart Association: www.heart.org Summary  The DASH eating  plan is a healthy eating plan that has been shown to reduce high blood pressure (hypertension). It may also reduce your risk for type 2 diabetes, heart disease, and stroke.  With the DASH eating plan, you should limit salt (sodium) intake to 2,300 mg a day. If you have hypertension, you may need to reduce your sodium intake to 1,500 mg a day.  When on the DASH eating plan, aim to eat more fresh fruits and vegetables, whole grains, lean proteins, low-fat dairy, and heart-healthy  fats.  Work with your health care provider or diet and nutrition specialist (dietitian) to adjust your eating plan to your individual calorie needs. This information is not intended to replace advice given to you by your health care provider. Make sure you discuss any questions you have with your health care provider. Document Released: 07/08/2011 Document Revised: 07/12/2016 Document Reviewed: 07/12/2016 Elsevier Interactive Patient Education  [employer].

## 2018-08-12 ENCOUNTER — Other Ambulatory Visit: Payer: Self-pay | Admitting: Nurse Practitioner

## 2018-08-12 DIAGNOSIS — F332 Major depressive disorder, recurrent severe without psychotic features: Secondary | ICD-10-CM

## 2018-08-17 DIAGNOSIS — F329 Major depressive disorder, single episode, unspecified: Secondary | ICD-10-CM | POA: Diagnosis not present

## 2018-08-18 ENCOUNTER — Ambulatory Visit: Payer: Medicare Other | Admitting: Nurse Practitioner

## 2018-08-24 ENCOUNTER — Other Ambulatory Visit: Payer: Self-pay | Admitting: *Deleted

## 2018-08-24 DIAGNOSIS — E785 Hyperlipidemia, unspecified: Secondary | ICD-10-CM

## 2018-08-24 MED ORDER — PRAVASTATIN SODIUM 40 MG PO TABS: ORAL_TABLET | ORAL | 1 refills | Status: DC

## 2018-08-24 NOTE — Telephone Encounter (Signed)
[facility] 

## 2018-09-14 DIAGNOSIS — F329 Major depressive disorder, single episode, unspecified: Secondary | ICD-10-CM | POA: Diagnosis not present

## 2018-09-19 DIAGNOSIS — Z85828 Personal history of other malignant neoplasm of skin: Secondary | ICD-10-CM | POA: Diagnosis not present

## 2018-09-19 DIAGNOSIS — L570 Actinic keratosis: Secondary | ICD-10-CM | POA: Diagnosis not present

## 2018-09-19 DIAGNOSIS — L820 Inflamed seborrheic keratosis: Secondary | ICD-10-CM | POA: Diagnosis not present

## 2018-09-19 DIAGNOSIS — L918 Other hypertrophic disorders of the skin: Secondary | ICD-10-CM | POA: Diagnosis not present

## 2018-09-19 DIAGNOSIS — L821 Other seborrheic keratosis: Secondary | ICD-10-CM | POA: Diagnosis not present

## 2018-10-16 ENCOUNTER — Ambulatory Visit: Payer: Medicare Other | Admitting: Nurse Practitioner

## 2018-10-17 ENCOUNTER — Ambulatory Visit (INDEPENDENT_AMBULATORY_CARE_PROVIDER_SITE_OTHER): Payer: Medicare Other | Admitting: Nurse Practitioner

## 2018-10-17 ENCOUNTER — Other Ambulatory Visit: Payer: Self-pay

## 2018-10-17 ENCOUNTER — Encounter: Payer: Self-pay | Admitting: Nurse Practitioner

## 2018-10-17 VITALS — BP 140/88 | HR 66 | Temp 97.8°F | Ht 60.0 in | Wt 106.0 lb

## 2018-10-17 DIAGNOSIS — R413 Other amnesia: Secondary | ICD-10-CM

## 2018-10-17 DIAGNOSIS — G4700 Insomnia, unspecified: Secondary | ICD-10-CM | POA: Diagnosis not present

## 2018-10-17 DIAGNOSIS — I10 Essential (primary) hypertension: Secondary | ICD-10-CM | POA: Diagnosis not present

## 2018-10-17 DIAGNOSIS — R05 Cough: Secondary | ICD-10-CM | POA: Diagnosis not present

## 2018-10-17 DIAGNOSIS — E782 Mixed hyperlipidemia: Secondary | ICD-10-CM

## 2018-10-17 DIAGNOSIS — F339 Major depressive disorder, recurrent, unspecified: Secondary | ICD-10-CM | POA: Diagnosis not present

## 2018-10-17 DIAGNOSIS — R059 Cough, unspecified: Secondary | ICD-10-CM

## 2018-10-17 MED ORDER — LOSARTAN POTASSIUM 25 MG PO TABS: 25.0000 mg | ORAL_TABLET | Freq: Every day | ORAL | 1 refills | Status: DC

## 2018-10-17 MED ORDER — TRAZODONE HCL 50 MG PO TABS: 50.0000 mg | ORAL_TABLET | Freq: Every day | ORAL | 1 refills | Status: DC

## 2018-10-17 NOTE — Progress Notes (Signed)
Careteam: Patient Care Team: [name], NP as PCP - General (Geriatric Medicine) [name], MD as Consulting Physician (Ophthalmology) Calvert Cantor, MD as Consulting Physician (Ophthalmology) Terrance Mass, MD (Inactive) as Consulting Physician (Gynecology)  Advanced Directive information Does Patient Have a Medical Advance Directive?: No  Allergies  Allergen Reactions  . Vioxx [Rofecoxib] Nausea Only    Pt. Can't remember what reaction was but asks to leave on her profile.     Chief Complaint  Patient presents with  . Follow-up    Blood pressure follow-up   . Sleeping Problem    Patient c/o trouble staying asleep, Trazodone ineffective, not taking   . Cough    Hacking cough x several years, never seen lung specialist   . Headache    Patient c/o frequent headaches   . Advanced Directive    Advance Care Planning. No HCPOA/Living Will on file      HPI: Patient is a 78 y.o. female seen in the office today for routine follow up.   Anxiety/Depression/insomnia- not taking zoloft or trazodone.  Goes to therapy, "not as often as I did" missed last appt, following up next week. Unsure how often she sees therapist. Does not routinely take trazodone or zoloft. States they did not work but only would take occasionally.   htn- does not follow any diet modifications. Eats a lot of can goods and frozen food. No chest pains, palpitations.   Having headaches again (hx of migraines)   Sister tells her she does eat right- states she will get up at night and eat something and headache will go away.   Hyperlipidemia- taking pravastin last LDL 155 in June 2019  For last week most chest congestion and productive cough. White sputum. Overall does not feel sick but having fatigue but this is not necessarily new and working more.  No fever No head congestion, sore throat or post nasal drip.  Has not taken anything for cough.  Review of Systems: poor historian.  Review  of Systems  Constitutional: Negative for chills, fever, malaise/fatigue and weight loss.  Respiratory: Positive for cough. Negative for shortness of breath.   Cardiovascular: Negative for chest pain.  Gastrointestinal: Negative for abdominal pain, diarrhea and heartburn.  Musculoskeletal: Positive for joint pain and myalgias. Negative for falls.  Neurological: Positive for headaches.  Psychiatric/Behavioral: Positive for depression and memory loss. The patient is nervous/anxious and has insomnia.     Past Medical History:  Diagnosis Date  . Allergy   . 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 Bunion 07/01/2010  . Depression 05/05/2007  . External hemorrhoids without mention of complication 70/26/3785  . GERD (gastroesophageal reflux disease) 2000  . Hyperlipidemia 06/29/2006  . Memory loss 04/15/2003  . Migraine without aura, without mention of intractable migraine without mention of status migrainosus 03/24/1999  . Osteoporosis, unspecified 04/15/2004  . 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 Unspecified essential hypertension 11/22/2007   Past Surgical History:  Procedure Laterality Date  . COLONOSCOPY  06-16-2007   Internal hemorrhoids,laxity of anal sphincter,polyp at 25cm form anal verge, tortuous sigmoid colon. Dr.Orr   . EYE SURGERY Bilateral 2010   cataract Dr. Bing Plume  . VAGINAL HYSTERECTOMY  1980   Social History:   reports that she has never smoked. She has never used smokeless tobacco. She reports that she does not drink alcohol or use drugs.  Family History  Problem Relation Age of Onset  . Alzheimer's disease Mother   . Diabetes  Mother   . Transient ischemic attack Mother   . Heart disease Mother        MI, CHF  . Alcohol abuse Father   . Kidney disease Father   . Hypertension Sister   . Hypertension Brother   . Heart disease Brother   . Hypertension Brother   . Hypertension Sister   . Hypertension Brother   . Other Son        truck accident  07/2016  . Colon cancer Neg Hx   . Stomach cancer Neg Hx     Medications: Patient's Medications  New Prescriptions   No medications on file  Previous Medications   ASCORBIC ACID (VITAMIN C) 1000 MG TABLET    Take 1,000 mg by mouth daily.   ASPIRIN 81 MG TABLET    Take 81 mg by mouth daily. Take 1 tablet daily to prevent heart attack, stroke and clotting.   CALCIUM-MAGNESIUM-VITAMIN D (CALCIUM 1200+D3 PO)    Take by mouth daily.   CHOLECALCIFEROL (HM VITAMIN D3) 2000 UNITS CAPS    Take by mouth. Take 1 tablet daily   CINNAMON PO    Take 1,000 mg by mouth daily.   CRANBERRY-VITAMIN C-VITAMIN E (CRANBERRY PLUS VITAMIN C) 4200-20-3 MG-MG-UNIT CAPS    Take by mouth daily.   CYCLOSPORINE (RESTASIS) 0.05 % OPHTHALMIC EMULSION    1 drop 2 (two) times daily.   MELATONIN 3 MG CAPS    Take by mouth as needed.   MULTIPLE VITAMIN (MULTI VITAMIN DAILY PO)    Take by mouth. Centrum Silver for Women Take 1 tablet daily   MULTIPLE VITAMINS-MINERALS (ICAPS AREDS FORMULA PO)    Take by mouth 2 (two) times daily.   PRAVASTATIN (PRAVACHOL) 40 MG TABLET    Take one tablet by mouth once daily to lower cholesterol  Modified Medications   No medications on file  Discontinued Medications   OMEPRAZOLE (PRILOSEC) 20 MG CAPSULE    Take 20 mg by mouth daily.   PROBIOTIC PRODUCT (PROBIOTIC-10 PO)    Take by mouth.   SERTRALINE (ZOLOFT) 25 MG TABLET    Take 1 tablet (25 mg total) by mouth daily. For depression   TRAZODONE (DESYREL) 50 MG TABLET    TAKE 1 TABLET BY MOUTH EVERYDAY AT BEDTIME     Physical Exam:  Vitals:   10/17/18 0829  BP: 140/88  Pulse: 66  Temp: 97.8 F (36.6 C)  TempSrc: Oral  SpO2: 97%  Weight: 106 lb (48.1 kg)  Height: 5' (1.524 m)   Body mass index is 20.7 kg/m.  Physical Exam Constitutional:      Appearance: Normal appearance. She is well-developed.  HENT:     Head: Normocephalic and atraumatic.     Mouth/Throat:     Pharynx: No oropharyngeal exudate.  Eyes:     General:  No scleral icterus.    Pupils: Pupils are equal, round, and reactive to light.  Neck:     Musculoskeletal: Neck supple.     Thyroid: No thyromegaly.     Trachea: No tracheal deviation.  Cardiovascular:     Rate and Rhythm: Normal rate and regular rhythm.     Heart sounds: No murmur. No friction rub. No gallop.      Comments: No LE edema b/l. no calf TTP.  Pulmonary:     Effort: Pulmonary effort is normal. No respiratory distress.     Breath sounds: Normal breath sounds. No stridor. No wheezing or rales.  Abdominal:  General: Bowel sounds are normal. There is no distension.     Palpations: Abdomen is soft. Abdomen is not rigid.  Skin:    General: Skin is warm and dry.     Findings: No rash.  Neurological:     Mental Status: She is alert and oriented to person, place, and time. Mental status is at baseline.     Sensory: No sensory deficit.     Motor: No weakness.     Coordination: Coordination normal.     Gait: Gait normal.     Deep Tendon Reflexes: Reflexes are normal and symmetric. Reflexes normal.  Psychiatric:        Mood and Affect: Mood is depressed.        Speech: Speech normal.        Behavior: Behavior normal.        Thought Content: Thought content normal.        Judgment: Judgment normal.     Labs reviewed: Basic Metabolic Panel: Recent Labs    01/20/18 1234 07/11/18 1616  NA 140 142  K 4.7 4.0  CL 103 106  CO2 30 26  GLUCOSE 96 112  BUN 18 19  CREATININE 0.89 0.81  CALCIUM 10.0 9.9   Liver Function Tests: Recent Labs    01/20/18 1234 07/11/18 1616  AST 20 19  ALT 13 12  BILITOT 0.4 0.2  PROT 6.8 6.6   Recent Labs    07/11/18 1616  LIPASE 22  AMYLASE 87   No results for input(s): AMMONIA in the last 8760 hours. CBC: Recent Labs    01/20/18 1234 07/11/18 1616  WBC 5.8 5.6  NEUTROABS 3,648 3,674  HGB 13.0 12.6  HCT 36.9 37.5  MCV 88.5 91.0  PLT 275 286   Lipid Panel: Recent Labs    01/20/18 1234  CHOL 255*  HDL 61  LDLCALC  155*  TRIG 220*  CHOLHDL 4.2   TSH: No results for input(s): TSH in the last 8760 hours. A1C: No results found for: HGBA1C   Assessment/Plan 1. Essential hypertension Blood pressure remains elevated 170/100, 160/90 on rechecks.  -dash diet given and encouraged dietary modifications.  -to start losartan 25 mg by mouth daily - CBC with Differential/Platelet - losartan (COZAAR) 25 MG tablet; Take 1 tablet (25 mg total) by mouth daily.  Dispense: 30 tablet; Refill: 1  2. Severe episode of recurrent major depressive disorder, without psychotic features ([place]) Ongoing, encouraged routine use of medication. To continue to follow up with therapist. Will restart trazodone to help with sleep and mood. To continue to stay active.   3. Mixed hyperlipidemia -continues on statin, will follow up - Lipid Panel - COMPLETE METABOLIC PANEL WITH GFR  4. Memory loss -ongoing, most likely stress and depression adding to poor recall. Continue to monitor  5. Insomnia, unspecified type Ongoing, encouraged to take trazodone nightly. To continue melatonin at bedtime.  - traZODone (DESYREL) 50 MG tablet; Take 1 tablet (50 mg total) by mouth at bedtime.  Dispense: 30 tablet; Refill: 1  6. Cough Supportive care at this time. mucinex DM by mouth twice daily with full glass of water for 7 days.  To notify for worsening of symptoms, shortness of breath, fever, chills, malaise, etc  Next appt: 1 month Kendrick Haapala K. [place], [facility] [phone]

## 2018-10-17 NOTE — Patient Instructions (Signed)
To start losartan 25 mg by mouth daily for high blood pressure Diet modifications encouraged  To restart trazodone, recommend to take it every night.  To continue use melatonin 3 mg by mouth at bedtime  To use mucinex DM by mouth twice daily with full glass of water for 1 week then as needed for cough and congestion.   Follow up in 1 month for blood pressure  DASH Eating Plan DASH stands for "Dietary Approaches to Stop Hypertension." The DASH eating plan is a healthy eating plan that has been shown to reduce high blood pressure (hypertension). It may also reduce your risk for type 2 diabetes, heart disease, and stroke. The DASH eating plan may also help with weight loss. What are tips for following this plan?  General guidelines  Avoid eating more than 2,300 mg (milligrams) of salt (sodium) a day. If you have hypertension, you may need to reduce your sodium intake to 1,500 mg a day.  Limit alcohol intake to no more than 1 drink a day for nonpregnant women and 2 drinks a day for men. One drink equals 12 oz of beer, 5 oz of wine, or 1 oz of hard liquor.  Work with your health care provider to maintain a healthy body weight or to lose weight. Ask what an ideal weight is for you.  Get at least 30 minutes of exercise that causes your heart to beat faster (aerobic exercise) most days of the week. Activities may include walking, swimming, or biking.  Work with your health care provider or diet and nutrition specialist (dietitian) to adjust your eating plan to your individual calorie needs. Reading food labels   Check food labels for the amount of sodium per serving. Choose foods with less than 5 percent of the Daily Value of sodium. Generally, foods with less than 300 mg of sodium per serving fit into this eating plan.  To find whole grains, look for the word "whole" as the first word in the ingredient list. Shopping  Buy products labeled as "low-sodium" or "no salt added."  Buy fresh  foods. Avoid canned foods and premade or frozen meals. Cooking  Avoid adding salt when cooking. Use salt-free seasonings or herbs instead of table salt or sea salt. Check with your health care provider or pharmacist before using salt substitutes.  Do not fry foods. Cook foods using healthy methods such as baking, boiling, grilling, and broiling instead.  Cook with heart-healthy oils, such as olive, canola, soybean, or sunflower oil. Meal planning  Eat a balanced diet that includes: ? 5 or more servings of fruits and vegetables each day. At each meal, try to fill half of your plate with fruits and vegetables. ? Up to 6-8 servings of whole grains each day. ? Less than 6 oz of lean meat, poultry, or fish each day. A 3-oz serving of meat is about the same size as a deck of cards. One egg equals 1 oz. ? 2 servings of low-fat dairy each day. ? A serving of nuts, seeds, or beans 5 times each week. ? Heart-healthy fats. Healthy fats called Omega-3 fatty acids are found in foods such as flaxseeds and coldwater fish, like sardines, salmon, and mackerel.  Limit how much you eat of the following: ? Canned or prepackaged foods. ? Food that is high in trans fat, such as fried foods. ? Food that is high in saturated fat, such as fatty meat. ? Sweets, desserts, sugary drinks, and other foods with added sugar. ?  Full-fat dairy products.  Do not salt foods before eating.  Try to eat at least 2 vegetarian meals each week.  Eat more home-cooked food and less restaurant, buffet, and fast food.  When eating at a restaurant, ask that your food be prepared with less salt or no salt, if possible. What foods are recommended? The items listed may not be a complete list. Talk with your dietitian about what dietary choices are best for you. Grains Whole-grain or whole-wheat bread. Whole-grain or whole-wheat pasta. Brown rice. [place] Morrow. Bulgur. Whole-grain and low-sodium cereals. Pita bread. Low-fat,  low-sodium crackers. Whole-wheat flour tortillas. Vegetables Fresh or frozen vegetables (raw, steamed, roasted, or grilled). Low-sodium or reduced-sodium tomato and vegetable juice. Low-sodium or reduced-sodium tomato sauce and tomato paste. Low-sodium or reduced-sodium canned vegetables. Fruits All fresh, dried, or frozen fruit. Canned fruit in natural juice (without added sugar). Meat and other protein foods Skinless chicken or [place]. Ground chicken or [place]. Pork with fat trimmed off. Fish and seafood. Egg whites. Dried beans, peas, or lentils. Unsalted nuts, nut butters, and seeds. Unsalted canned beans. Lean cuts of beef with fat trimmed off. Low-sodium, lean deli meat. Dairy Low-fat (1%) or fat-free (skim) milk. Fat-free, low-fat, or reduced-fat cheeses. Nonfat, low-sodium ricotta or cottage cheese. Low-fat or nonfat yogurt. Low-fat, low-sodium cheese. Fats and oils Soft margarine without trans fats. Vegetable oil. Low-fat, reduced-fat, or light mayonnaise and salad dressings (reduced-sodium). Canola, safflower, olive, soybean, and sunflower oils. Avocado. Seasoning and other foods Herbs. Spices. Seasoning mixes without salt. Unsalted popcorn and pretzels. Fat-free sweets. What foods are not recommended? The items listed may not be a complete list. Talk with your dietitian about what dietary choices are best for you. Grains Baked goods made with fat, such as croissants, muffins, or some breads. Dry pasta or rice meal packs. Vegetables Creamed or fried vegetables. Vegetables in a cheese sauce. Regular canned vegetables (not low-sodium or reduced-sodium). Regular canned tomato sauce and paste (not low-sodium or reduced-sodium). Regular tomato and vegetable juice (not low-sodium or reduced-sodium). Angie Fava. Olives. Fruits Canned fruit in a light or heavy syrup. Fried fruit. Fruit in cream or butter sauce. Meat and other protein foods Fatty cuts of meat. Ribs. Fried meat. Berniece Salines. Sausage.  Bologna and other processed lunch meats. Salami. Fatback. Hotdogs. Bratwurst. Salted nuts and seeds. Canned beans with added salt. Canned or smoked fish. Whole eggs or egg yolks. Chicken or [place] with skin. Dairy Whole or 2% milk, cream, and half-and-half. Whole or full-fat cream cheese. Whole-fat or sweetened yogurt. Full-fat cheese. Nondairy creamers. Whipped toppings. Processed cheese and cheese spreads. Fats and oils Butter. Stick margarine. Lard. Shortening. Ghee. Bacon fat. Tropical oils, such as coconut, palm kernel, or palm oil. Seasoning and other foods Salted popcorn and pretzels. Onion salt, garlic salt, seasoned salt, table salt, and sea salt. Worcestershire sauce. Tartar sauce. Barbecue sauce. Teriyaki sauce. Soy sauce, including reduced-sodium. Steak sauce. Canned and packaged gravies. Fish sauce. Oyster sauce. Cocktail sauce. Horseradish that you find on the shelf. Ketchup. Mustard. Meat flavorings and tenderizers. Bouillon cubes. Hot sauce and Tabasco sauce. Premade or packaged marinades. Premade or packaged taco seasonings. Relishes. Regular salad dressings. Where to find more information:  National Heart, Lung, and [place]: https://wilson-eaton.com/  American Heart Association: www.heart.org Summary  The DASH eating plan is a healthy eating plan that has been shown to reduce high blood pressure (hypertension). It may also reduce your risk for type 2 diabetes, heart disease, and stroke.  With the DASH eating plan, you  should limit salt (sodium) intake to 2,300 mg a day. If you have hypertension, you may need to reduce your sodium intake to 1,500 mg a day.  When on the DASH eating plan, aim to eat more fresh fruits and vegetables, whole grains, lean proteins, low-fat dairy, and heart-healthy fats.  Work with your health care provider or diet and nutrition specialist (dietitian) to adjust your eating plan to your individual calorie needs. This information is not intended to  replace advice given to you by your health care provider. Make sure you discuss any questions you have with your health care provider. Document Released: 07/08/2011 Document Revised: 07/12/2016 Document Reviewed: 07/12/2016 Elsevier Interactive Patient Education  2019 [employer].

## 2018-10-18 ENCOUNTER — Other Ambulatory Visit: Payer: Self-pay

## 2018-10-18 LAB — CBC WITH DIFFERENTIAL/PLATELET
Absolute Monocytes: 360 {cells}/uL (ref 200–950)
Basophils Absolute: 62 {cells}/uL (ref 0–200)
Basophils Relative: 1.3 %
Eosinophils Absolute: 110 {cells}/uL (ref 15–500)
Eosinophils Relative: 2.3 %
HCT: 38.1 % (ref 35.0–45.0)
Hemoglobin: 13.1 g/dL (ref 11.7–15.5)
Lymphs Abs: 1267 {cells}/uL (ref 850–3900)
MCH: 30.9 pg (ref 27.0–33.0)
MCHC: 34.4 g/dL (ref 32.0–36.0)
MCV: 89.9 fL (ref 80.0–100.0)
MPV: 10.7 fL (ref 7.5–12.5)
Monocytes Relative: 7.5 %
Neutro Abs: 3000 {cells}/uL (ref 1500–7800)
Neutrophils Relative %: 62.5 %
Platelets: 261 10*3/uL (ref 140–400)
RBC: 4.24 Million/uL (ref 3.80–5.10)
RDW: 12.1 % (ref 11.0–15.0)
Total Lymphocyte: 26.4 %
WBC: 4.8 10*3/uL (ref 3.8–10.8)

## 2018-10-18 LAB — COMPLETE METABOLIC PANEL WITH GFR
AG Ratio: 1.8 (calc) (ref 1.0–2.5)
ALT: 15 U/L (ref 6–29)
AST: 24 U/L (ref 10–35)
Albumin: 4.2 g/dL (ref 3.6–5.1)
Alkaline phosphatase (APISO): 44 U/L (ref 37–153)
BUN: 17 mg/dL (ref 7–25)
CO2: 25 mmol/L (ref 20–32)
Calcium: 9.4 mg/dL (ref 8.6–10.4)
Chloride: 104 mmol/L (ref 98–110)
Creat: 0.92 mg/dL (ref 0.60–0.93)
GFR, Est African American: 69 mL/min/{1.73_m2} (ref >=60)
GFR, Est Non African American: 60 mL/min/{1.73_m2} (ref >=60)
GLUCOSE: 101 mg/dL — AB (ref 65–99)
Globulin: 2.3 g/dL (calc) (ref 1.9–3.7)
Potassium: 4.7 mmol/L (ref 3.5–5.3)
Sodium: 139 mmol/L (ref 135–146)
Total Bilirubin: 0.4 mg/dL (ref 0.2–1.2)
Total Protein: 6.5 g/dL (ref 6.1–8.1)

## 2018-10-18 LAB — LIPID PANEL
Cholesterol: 268 mg/dL — ABNORMAL HIGH (ref <200)
HDL: 67 mg/dL (ref >=50)
LDL Cholesterol (Calc): 172 mg/dL (calc) — ABNORMAL HIGH
Non-HDL Cholesterol (Calc): 201 mg/dL (calc) — ABNORMAL HIGH (ref <130)
Total CHOL/HDL Ratio: 4 (calc) (ref <5.0)
Triglycerides: 149 mg/dL (ref <150)

## 2018-10-18 MED ORDER — ROSUVASTATIN CALCIUM 10 MG PO TABS: ORAL_TABLET | ORAL | 1 refills | Status: DC

## 2018-11-02 DIAGNOSIS — F329 Major depressive disorder, single episode, unspecified: Secondary | ICD-10-CM | POA: Diagnosis not present

## 2018-11-10 ENCOUNTER — Other Ambulatory Visit: Payer: Self-pay | Admitting: Nurse Practitioner

## 2018-11-12 ENCOUNTER — Other Ambulatory Visit: Payer: Self-pay | Admitting: Nurse Practitioner

## 2018-11-12 DIAGNOSIS — I10 Essential (primary) hypertension: Secondary | ICD-10-CM

## 2018-11-16 ENCOUNTER — Encounter: Payer: Self-pay | Admitting: Nurse Practitioner

## 2018-11-16 ENCOUNTER — Ambulatory Visit (INDEPENDENT_AMBULATORY_CARE_PROVIDER_SITE_OTHER): Payer: Medicare Other | Admitting: Nurse Practitioner

## 2018-11-16 ENCOUNTER — Other Ambulatory Visit: Payer: Self-pay

## 2018-11-16 DIAGNOSIS — I10 Essential (primary) hypertension: Secondary | ICD-10-CM | POA: Diagnosis not present

## 2018-11-16 DIAGNOSIS — E782 Mixed hyperlipidemia: Secondary | ICD-10-CM | POA: Diagnosis not present

## 2018-11-16 DIAGNOSIS — G4700 Insomnia, unspecified: Secondary | ICD-10-CM | POA: Diagnosis not present

## 2018-11-16 DIAGNOSIS — Z23 Encounter for immunization: Secondary | ICD-10-CM

## 2018-11-16 MED ORDER — TETANUS-DIPHTH-ACELL PERTUSSIS 5-2-15.5 LF-MCG/0.5 IM SUSP: 0.5000 mL | Freq: Once | INTRAMUSCULAR | 0 refills | Status: AC

## 2018-11-16 NOTE — Progress Notes (Signed)
This service is provided via telemedicine  No vital signs collected/recorded due to the encounter was a telemedicine visit.   Location of patient (ex: home, work): work  Patient consents to a telephone visit:  yes  Location of the provider (ex: office, home):  Office   Names of all persons participating in the telemedicine service and their role in the encounter:  Ruthell Rummage CMA, Sherrie Mustache NP, Robyne Askew  Time spent on call:  Ruthell Rummage CMA spent   7  Minutes on phone with patient.   Virtual Visit via Video Note  I connected with Caroline Snyder on 11/16/18 at  9:30 AM EDT by a video enabled telemedicine application and verified that I am speaking with the correct person using two identifiers.   I discussed the limitations of evaluation and management by telemedicine and the availability of in person appointments. The patient expressed understanding and agreed to proceed.     Careteam: Patient Care Team: [name], NP as PCP - General (Geriatric Medicine) [name], MD as Consulting Physician (Ophthalmology) Calvert Cantor, MD as Consulting Physician (Ophthalmology) Terrance Mass, MD (Inactive) as Consulting Physician (Gynecology)  Advanced Directive information Does Patient Have a Medical Advance Directive?: No  Allergies  Allergen Reactions  . Vioxx [Rofecoxib] Nausea Only    Pt. Can't remember what reaction was but asks to leave on her profile.     Chief Complaint  Patient presents with  . Medical Management of Chronic Issues    1 month follow up on blood pressure   . Quality Metric Gaps    Tetanus and Mammogram     HPI: Patient is a 78 y.o. female with hx of htn, depression, insomnia, hyperlipidemia.  Following today for blood pressure- elevated at last office visit and she has been started on losartan 25 mg daily, tolerating medication well without side effects.   Blood pressure readings at home 122/69, 129/68,  150/72 HR 70, 136/62 HR 60, 148/80 Unsure if she had taken her medication when she had taken some of the blood pressure readings.   Continues to eat poorly. Eating foods elevated in sodium, eats for convenience.   Insomnia/depression- does not take trazodone.   Hyperlipidemia- continues on Crestor 10 mg by mouth daily, no side effects noted.  Review of Systems:  Review of Systems  Constitutional: Negative for chills, fever, malaise/fatigue and weight loss.  Respiratory: Positive for cough (unchanged). Negative for shortness of breath.   Cardiovascular: Negative for chest pain.  Gastrointestinal: Negative for abdominal pain, diarrhea and heartburn.  Musculoskeletal: Negative for falls.  Neurological: Negative for headaches.  Psychiatric/Behavioral: Positive for depression and memory loss. The patient is nervous/anxious and has insomnia.     Past Medical History:  Diagnosis Date  . Allergy   . 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 Bunion 07/01/2010  . Depression 05/05/2007  . External hemorrhoids without mention of complication 16/05/9603  . GERD (gastroesophageal reflux disease) 2000  . Hyperlipidemia 06/29/2006  . Memory loss 04/15/2003  . Migraine without aura, without mention of intractable migraine without mention of status migrainosus 03/24/1999  . Osteoporosis, unspecified 04/15/2004  . 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 Unspecified essential hypertension 11/22/2007   Past Surgical History:  Procedure Laterality Date  . COLONOSCOPY  06-16-2007   Internal hemorrhoids,laxity of anal sphincter,polyp at 25cm form anal verge, tortuous sigmoid colon. Dr.Orr   . EYE SURGERY Bilateral 2010   cataract Dr. Bing Plume  . VAGINAL HYSTERECTOMY  1980   Social History:   reports that she has never smoked. She has never used smokeless tobacco. She reports that she does not drink alcohol or use drugs.  Family History  Problem Relation Age of Onset  . Alzheimer's disease  Mother   . Diabetes Mother   . Transient ischemic attack Mother   . Heart disease Mother        MI, CHF  . Alcohol abuse Father   . Kidney disease Father   . Hypertension Sister   . Hypertension Brother   . Heart disease Brother   . Hypertension Brother   . Hypertension Sister   . Hypertension Brother   . Other Son        truck accident 07/2016  . Colon cancer Neg Hx   . Stomach cancer Neg Hx     Medications: Patient's Medications  New Prescriptions   No medications on file  Previous Medications   ASCORBIC ACID (VITAMIN C) 1000 MG TABLET    Take 1,000 mg by mouth daily.   ASPIRIN 81 MG TABLET    Take 81 mg by mouth daily. Take 1 tablet daily to prevent heart attack, stroke and clotting.   CALCIUM-MAGNESIUM-VITAMIN D (CALCIUM 1200+D3 PO)    Take by mouth daily.   CHOLECALCIFEROL (HM VITAMIN D3) 2000 UNITS CAPS    Take by mouth. Take 1 tablet daily   CINNAMON PO    Take 1,000 mg by mouth daily.   CRANBERRY-VITAMIN C-VITAMIN E (CRANBERRY PLUS VITAMIN C) 4200-20-3 MG-MG-UNIT CAPS    Take by mouth daily.   CYCLOSPORINE (RESTASIS) 0.05 % OPHTHALMIC EMULSION    1 drop 2 (two) times daily.   LOSARTAN (COZAAR) 25 MG TABLET    TAKE 1 TABLET BY MOUTH EVERY DAY   MELATONIN 3 MG CAPS    Take by mouth as needed.   MULTIPLE VITAMIN (MULTI VITAMIN DAILY PO)    Take by mouth. Centrum Silver for Women Take 1 tablet daily   MULTIPLE VITAMINS-MINERALS (ICAPS AREDS FORMULA PO)    Take by mouth 2 (two) times daily.   ROSUVASTATIN (CRESTOR) 10 MG TABLET    1 whole tablet 10 mg by mouth daily for high cholesterol   TDAP (ADACEL) 12-01-13.5 LF-MCG/0.5 INJECTION    Inject 0.5 mLs into the muscle once.   TRAZODONE (DESYREL) 50 MG TABLET    Take 1 tablet (50 mg total) by mouth at bedtime.  Modified Medications   No medications on file  Discontinued Medications   No medications on file     Physical Exam: unable due to tele-visit    Labs reviewed: Basic Metabolic Panel: Recent Labs    01/20/18  1234 07/11/18 1616 10/17/18 0902  NA 140 142 139  K 4.7 4.0 4.7  CL 103 106 104  CO2 30 26 25   GLUCOSE 96 112 101*  BUN 18 19 17   CREATININE 0.89 0.81 0.92  CALCIUM 10.0 9.9 9.4   Liver Function Tests: Recent Labs    01/20/18 1234 07/11/18 1616 10/17/18 0902  AST 20 19 24   ALT 13 12 15   BILITOT 0.4 0.2 0.4  PROT 6.8 6.6 6.5   Recent Labs    07/11/18 1616  LIPASE 22  AMYLASE 87   No results for input(s): AMMONIA in the last 8760 hours. CBC: Recent Labs    01/20/18 1234 07/11/18 1616 10/17/18 0902  WBC 5.8 5.6 4.8  NEUTROABS 3,648 3,674 3,000  HGB 13.0 12.6 13.1  HCT 36.9 37.5 38.1  MCV 88.5 91.0 89.9  PLT 275 286 261   Lipid Panel: Recent Labs    01/20/18 1234 10/17/18 0902  CHOL 255* 268*  HDL 61 67  LDLCALC 155* 172*  TRIG 220* 149  CHOLHDL 4.2 4.0   TSH: No results for input(s): TSH in the last 8760 hours. A1C: No results found for: HGBA1C   Assessment/Plan 1. Need for diphtheria-tetanus-pertussis (Tdap) vaccine -Rx sent to pharmacy - Tdap (ADACEL) 12-01-13.5 LF-MCG/0.5 injection; Inject 0.5 mLs into the muscle once for 1 dose.  Dispense: 0.5 mL; Refill: 0  2. Essential hypertension -improved with losartan. To continue 25 mg by mouth daily and encouraged dietary modifications.  - COMPLETE METABOLIC PANEL WITH GFR; Future  3. Mixed hyperlipidemia Continues on crestor, without myalgias noted.  - COMPLETE METABOLIC PANEL WITH GFR; Future - Lipid panel; Future  4. Insomnia, unspecified type Has stopped trazodone, did not take routinely. Information provided on sleep   Next appt: 12/14/2018 for lab, 5 month for routine follow up [place]. Harle Battiest  [facility] [phone]   Follow Up Instructions:    I discussed the assessment and treatment plan with the patient. The patient was provided an opportunity to ask questions and all were answered. The patient agreed with the plan and demonstrated an  understanding of the instructions.   The patient was advised to call back or seek an in-person evaluation if the symptoms worsen or if the condition fails to improve as anticipated.  I provided 11  minutes of non-face-to-face time during this encounter.   Sherrie Mustache, NP

## 2018-11-16 NOTE — Patient Instructions (Addendum)
Continue losartan 25 mg by mouth daily for blood pressure.    DASH Eating Plan DASH stands for "Dietary Approaches to Stop Hypertension." The DASH eating plan is a healthy eating plan that has been shown to reduce high blood pressure (hypertension). It may also reduce your risk for type 2 diabetes, heart disease, and stroke. The DASH eating plan may also help with weight loss. What are tips for following this plan?  General guidelines  Avoid eating more than 2,300 mg (milligrams) of salt (sodium) a day. If you have hypertension, you may need to reduce your sodium intake to 1,500 mg a day.  Limit alcohol intake to no more than 1 drink a day for nonpregnant women and 2 drinks a day for men. One drink equals 12 oz of beer, 5 oz of wine, or 1 oz of hard liquor.  Work with your health care provider to maintain a healthy body weight or to lose weight. Ask what an ideal weight is for you.  Get at least 30 minutes of exercise that causes your heart to beat faster (aerobic exercise) most days of the week. Activities may include walking, swimming, or biking.  Work with your health care provider or diet and nutrition specialist (dietitian) to adjust your eating plan to your individual calorie needs. Reading food labels   Check food labels for the amount of sodium per serving. Choose foods with less than 5 percent of the Daily Value of sodium. Generally, foods with less than 300 mg of sodium per serving fit into this eating plan.  To find whole grains, look for the word "whole" as the first word in the ingredient list. Shopping  Buy products labeled as "low-sodium" or "no salt added."  Buy fresh foods. Avoid canned foods and premade or frozen meals. Cooking  Avoid adding salt when cooking. Use salt-free seasonings or herbs instead of table salt or sea salt. Check with your health care provider or pharmacist before using salt substitutes.  Do not fry foods. Cook foods using healthy methods such  as baking, boiling, grilling, and broiling instead.  Cook with heart-healthy oils, such as olive, canola, soybean, or sunflower oil. Meal planning  Eat a balanced diet that includes: ? 5 or more servings of fruits and vegetables each day. At each meal, try to fill half of your plate with fruits and vegetables. ? Up to 6-8 servings of whole grains each day. ? Less than 6 oz of lean meat, poultry, or fish each day. A 3-oz serving of meat is about the same size as a deck of cards. One egg equals 1 oz. ? 2 servings of low-fat dairy each day. ? A serving of nuts, seeds, or beans 5 times each week. ? Heart-healthy fats. Healthy fats called Omega-3 fatty acids are found in foods such as flaxseeds and coldwater fish, like sardines, salmon, and mackerel.  Limit how much you eat of the following: ? Canned or prepackaged foods. ? Food that is high in trans fat, such as fried foods. ? Food that is high in saturated fat, such as fatty meat. ? Sweets, desserts, sugary drinks, and other foods with added sugar. ? Full-fat dairy products.  Do not salt foods before eating.  Try to eat at least 2 vegetarian meals each week.  Eat more home-cooked food and less restaurant, buffet, and fast food.  When eating at a restaurant, ask that your food be prepared with less salt or no salt, if possible. What foods are recommended? The  items listed may not be a complete list. Talk with your dietitian about what dietary choices are best for you. Grains Whole-grain or whole-wheat bread. Whole-grain or whole-wheat pasta. Brown rice. [place]. Bulgur. Whole-grain and low-sodium cereals. Pita bread. Low-fat, low-sodium crackers. Whole-wheat flour tortillas. Vegetables Fresh or frozen vegetables (raw, steamed, roasted, or grilled). Low-sodium or reduced-sodium tomato and vegetable juice. Low-sodium or reduced-sodium tomato sauce and tomato paste. Low-sodium or reduced-sodium canned vegetables. Fruits All fresh,  dried, or frozen fruit. Canned fruit in natural juice (without added sugar). Meat and other protein foods Skinless chicken or [place]. Ground chicken or [place]. Pork with fat trimmed off. Fish and seafood. Egg whites. Dried beans, peas, or lentils. Unsalted nuts, nut butters, and seeds. Unsalted canned beans. Lean cuts of beef with fat trimmed off. Low-sodium, lean deli meat. Dairy Low-fat (1%) or fat-free (skim) milk. Fat-free, low-fat, or reduced-fat cheeses. Nonfat, low-sodium ricotta or cottage cheese. Low-fat or nonfat yogurt. Low-fat, low-sodium cheese. Fats and oils Soft margarine without trans fats. Vegetable oil. Low-fat, reduced-fat, or light mayonnaise and salad dressings (reduced-sodium). Canola, safflower, olive, soybean, and sunflower oils. Avocado. Seasoning and other foods Herbs. Spices. Seasoning mixes without salt. Unsalted popcorn and pretzels. Fat-free sweets. What foods are not recommended? The items listed may not be a complete list. Talk with your dietitian about what dietary choices are best for you. Grains Baked goods made with fat, such as croissants, muffins, or some breads. Dry pasta or rice meal packs. Vegetables Creamed or fried vegetables. Vegetables in a cheese sauce. Regular canned vegetables (not low-sodium or reduced-sodium). Regular canned tomato sauce and paste (not low-sodium or reduced-sodium). Regular tomato and vegetable juice (not low-sodium or reduced-sodium). Angie Fava. Olives. Fruits Canned fruit in a light or heavy syrup. Fried fruit. Fruit in cream or butter sauce. Meat and other protein foods Fatty cuts of meat. Ribs. Fried meat. Berniece Salines. Sausage. Bologna and other processed lunch meats. Salami. Fatback. Hotdogs. Bratwurst. Salted nuts and seeds. Canned beans with added salt. Canned or smoked fish. Whole eggs or egg yolks. Chicken or [place] with skin. Dairy Whole or 2% milk, cream, and half-and-half. Whole or full-fat cream cheese. Whole-fat or sweetened  yogurt. Full-fat cheese. Nondairy creamers. Whipped toppings. Processed cheese and cheese spreads. Fats and oils Butter. Stick margarine. Lard. Shortening. Ghee. Bacon fat. Tropical oils, such as coconut, palm kernel, or palm oil. Seasoning and other foods Salted popcorn and pretzels. Onion salt, garlic salt, seasoned salt, table salt, and sea salt. Worcestershire sauce. Tartar sauce. Barbecue sauce. Teriyaki sauce. Soy sauce, including reduced-sodium. Steak sauce. Canned and packaged gravies. Fish sauce. Oyster sauce. Cocktail sauce. Horseradish that you find on the shelf. Ketchup. Mustard. Meat flavorings and tenderizers. Bouillon cubes. Hot sauce and Tabasco sauce. Premade or packaged marinades. Premade or packaged taco seasonings. Relishes. Regular salad dressings. Where to find more information:  National Heart, Lung, and [place]: https://wilson-eaton.com/  American Heart Association: www.heart.org Summary  The DASH eating plan is a healthy eating plan that has been shown to reduce high blood pressure (hypertension). It may also reduce your risk for type 2 diabetes, heart disease, and stroke.  With the DASH eating plan, you should limit salt (sodium) intake to 2,300 mg a day. If you have hypertension, you may need to reduce your sodium intake to 1,500 mg a day.  When on the DASH eating plan, aim to eat more fresh fruits and vegetables, whole grains, lean proteins, low-fat dairy, and heart-healthy fats.  Work with your health care provider  or diet and nutrition specialist (dietitian) to adjust your eating plan to your individual calorie needs. This information is not intended to replace advice given to you by your health care provider. Make sure you discuss any questions you have with your health care provider. Document Released: 07/08/2011 Document Revised: 07/12/2016 Document Reviewed: 07/12/2016 Elsevier Interactive Patient Education  2019 [employer].    Insomnia Insomnia is a  sleep disorder that makes it difficult to fall asleep or stay asleep. Insomnia can cause fatigue, low energy, difficulty concentrating, mood swings, and poor performance at work or school. There are three different ways to classify insomnia:  Difficulty falling asleep.  Difficulty staying asleep.  Waking up too early in the morning. Any type of insomnia can be long-term (chronic) or short-term (acute). Both are common. Short-term insomnia usually lasts for three months or less. Chronic insomnia occurs at least three times a week for longer than three months. What are the causes? Insomnia may be caused by another condition, situation, or substance, such as:  Anxiety.  Certain medicines.  Gastroesophageal reflux disease (GERD) or other gastrointestinal conditions.  Asthma or other breathing conditions.  Restless legs syndrome, sleep apnea, or other sleep disorders.  Chronic pain.  Menopause.  Stroke.  Abuse of alcohol, tobacco, or illegal drugs.  Mental health conditions, such as depression.  Caffeine.  Neurological disorders, such as Alzheimer's disease.  An overactive thyroid (hyperthyroidism). Sometimes, the cause of insomnia may not be known. What increases the risk? Risk factors for insomnia include:  Gender. Women are affected more often than men.  Age. Insomnia is more common as you get older.  Stress.  Lack of exercise.  Irregular work schedule or working night shifts.  Traveling between different time zones.  Certain medical and mental health conditions. What are the signs or symptoms? If you have insomnia, the main symptom is having trouble falling asleep or having trouble staying asleep. This may lead to other symptoms, such as:  Feeling fatigued or having low energy.  Feeling nervous about going to sleep.  Not feeling rested in the morning.  Having trouble concentrating.  Feeling irritable, anxious, or depressed. How is this diagnosed? This  condition may be diagnosed based on:  Your symptoms and medical history. Your health care provider may ask about: ? Your sleep habits. ? Any medical conditions you have. ? Your mental health.  A physical exam. How is this treated? Treatment for insomnia depends on the cause. Treatment may focus on treating an underlying condition that is causing insomnia. Treatment may also include:  Medicines to help you sleep.  Counseling or therapy.  Lifestyle adjustments to help you sleep better. Follow these instructions at home: Eating and drinking   Limit or avoid alcohol, caffeinated beverages, and cigarettes, especially close to bedtime. These can disrupt your sleep.  Do not eat a large meal or eat spicy foods right before bedtime. This can lead to digestive discomfort that can make it hard for you to sleep. Sleep habits   Keep a sleep diary to help you and your health care provider figure out what could be causing your insomnia. Write down: ? When you sleep. ? When you wake up during the night. ? How well you sleep. ? How rested you feel the next day. ? Any side effects of medicines you are taking. ? What you eat and drink.  Make your bedroom a dark, comfortable place where it is easy to fall asleep. ? Put up shades or blackout curtains  to block light from outside. ? Use a white noise machine to block noise. ? Keep the temperature cool.  Limit screen use before bedtime. This includes: ? Watching TV. ? Using your smartphone, tablet, or computer.  Stick to a routine that includes going to bed and waking up at the same times every day and night. This can help you fall asleep faster. Consider making a quiet activity, such as reading, part of your nighttime routine.  Try to avoid taking naps during the day so that you sleep better at night.  Get out of bed if you are still awake after 15 minutes of trying to sleep. Keep the lights down, but try reading or doing a quiet activity.  When you feel sleepy, go back to bed. General instructions  Take over-the-counter and prescription medicines only as told by your health care provider.  Exercise regularly, as told by your health care provider. Avoid exercise starting several hours before bedtime.  Use relaxation techniques to manage stress. Ask your health care provider to suggest some techniques that may work well for you. These may include: ? Breathing exercises. ? Routines to release muscle tension. ? Visualizing peaceful scenes.  Make sure that you drive carefully. Avoid driving if you feel very sleepy.  Keep all follow-up visits as told by your health care provider. This is important. Contact a health care provider if:  You are tired throughout the day.  You have trouble in your daily routine due to sleepiness.  You continue to have sleep problems, or your sleep problems get worse. Get help right away if:  You have serious thoughts about hurting yourself or someone else. If you ever feel like you may hurt yourself or others, or have thoughts about taking your own life, get help right away. You can go to your nearest emergency department or call:  Your local emergency services (911 in the [place]  A suicide crisis helpline, such as the [place] at 212-865-5559. This is open 24 hours a day. Summary  Insomnia is a sleep disorder that makes it difficult to fall asleep or stay asleep.  Insomnia can be long-term (chronic) or short-term (acute).  Treatment for insomnia depends on the cause. Treatment may focus on treating an underlying condition that is causing insomnia.  Keep a sleep diary to help you and your health care provider figure out what could be causing your insomnia. This information is not intended to replace advice given to you by your health care provider. Make sure you discuss any questions you have with your health care provider. Document Released: 07/16/2000  Document Revised: 04/28/2017 Document Reviewed: 04/28/2017 Elsevier Interactive Patient Education  2019 [employer].

## 2018-11-17 ENCOUNTER — Ambulatory Visit: Payer: Medicare Other | Admitting: Nurse Practitioner

## 2018-11-30 DIAGNOSIS — F329 Major depressive disorder, single episode, unspecified: Secondary | ICD-10-CM | POA: Diagnosis not present

## 2018-12-14 ENCOUNTER — Other Ambulatory Visit: Payer: Medicare Other

## 2018-12-14 ENCOUNTER — Other Ambulatory Visit: Payer: Self-pay

## 2018-12-14 DIAGNOSIS — I10 Essential (primary) hypertension: Secondary | ICD-10-CM | POA: Diagnosis not present

## 2018-12-14 DIAGNOSIS — E782 Mixed hyperlipidemia: Secondary | ICD-10-CM

## 2018-12-14 LAB — COMPLETE METABOLIC PANEL WITH GFR
AG Ratio: 1.8 (calc) (ref 1.0–2.5)
ALT: 14 U/L (ref 6–29)
AST: 20 U/L (ref 10–35)
Albumin: 4.3 g/dL (ref 3.6–5.1)
Alkaline phosphatase (APISO): 48 U/L (ref 37–153)
BUN: 17 mg/dL (ref 7–25)
CO2: 29 mmol/L (ref 20–32)
Calcium: 9.6 mg/dL (ref 8.6–10.4)
Chloride: 107 mmol/L (ref 98–110)
Creat: 0.9 mg/dL (ref 0.60–0.93)
GFR, Est African American: 70 mL/min/{1.73_m2} (ref >=60)
GFR, Est Non African American: 61 mL/min/{1.73_m2} (ref >=60)
Globulin: 2.4 g/dL (calc) (ref 1.9–3.7)
Glucose, Bld: 102 mg/dL — ABNORMAL HIGH (ref 65–99)
Potassium: 4.4 mmol/L (ref 3.5–5.3)
Sodium: 141 mmol/L (ref 135–146)
Total Bilirubin: 0.4 mg/dL (ref 0.2–1.2)
Total Protein: 6.7 g/dL (ref 6.1–8.1)

## 2018-12-14 LAB — LIPID PANEL
Cholesterol: 195 mg/dL (ref <200)
HDL: 65 mg/dL (ref >=50)
LDL Cholesterol (Calc): 107 mg/dL (calc) — ABNORMAL HIGH
Non-HDL Cholesterol (Calc): 130 mg/dL (calc) — ABNORMAL HIGH (ref <130)
Total CHOL/HDL Ratio: 3 (calc) (ref <5.0)
Triglycerides: 119 mg/dL (ref <150)

## 2018-12-28 DIAGNOSIS — F329 Major depressive disorder, single episode, unspecified: Secondary | ICD-10-CM | POA: Diagnosis not present

## 2019-01-11 ENCOUNTER — Other Ambulatory Visit: Payer: Self-pay | Admitting: Nurse Practitioner

## 2019-01-11 DIAGNOSIS — F332 Major depressive disorder, recurrent severe without psychotic features: Secondary | ICD-10-CM

## 2019-01-11 NOTE — Telephone Encounter (Signed)
Per last OV note patient stopped Trazodone

## 2019-01-18 DIAGNOSIS — F329 Major depressive disorder, single episode, unspecified: Secondary | ICD-10-CM | POA: Diagnosis not present

## 2019-02-01 DIAGNOSIS — H04123 Dry eye syndrome of bilateral lacrimal glands: Secondary | ICD-10-CM | POA: Diagnosis not present

## 2019-02-01 DIAGNOSIS — H524 Presbyopia: Secondary | ICD-10-CM | POA: Diagnosis not present

## 2019-02-01 DIAGNOSIS — H52223 Regular astigmatism, bilateral: Secondary | ICD-10-CM | POA: Diagnosis not present

## 2019-02-01 DIAGNOSIS — Z961 Presence of intraocular lens: Secondary | ICD-10-CM | POA: Diagnosis not present

## 2019-02-01 DIAGNOSIS — H5213 Myopia, bilateral: Secondary | ICD-10-CM | POA: Diagnosis not present

## 2019-02-01 DIAGNOSIS — H353111 Nonexudative age-related macular degeneration, right eye, early dry stage: Secondary | ICD-10-CM | POA: Diagnosis not present

## 2019-02-01 DIAGNOSIS — H353122 Nonexudative age-related macular degeneration, left eye, intermediate dry stage: Secondary | ICD-10-CM | POA: Diagnosis not present

## 2019-03-06 DIAGNOSIS — F329 Major depressive disorder, single episode, unspecified: Secondary | ICD-10-CM | POA: Diagnosis not present

## 2019-04-06 ENCOUNTER — Other Ambulatory Visit: Payer: Self-pay

## 2019-04-06 ENCOUNTER — Encounter: Payer: Self-pay | Admitting: Family

## 2019-04-06 ENCOUNTER — Encounter: Payer: Medicare Other | Admitting: Family

## 2019-04-06 NOTE — Progress Notes (Signed)
This service is provided via telemedicine  No vital signs collected/recorded due to the encounter was a telemedicine visit.   Location of patient (ex: home, work):  Home   Patient consents to a telephone visit:  Yes  Location of the provider (ex: office, home):  Office   Name of any referring provider:  Sherrie Mustache, NP   Names of all persons participating in the telemedicine service and their role in the encounter:  Marlowe Sax, NP, Ruthell Rummage CMA, Robyne Askew   Time spent on call:  Ruthell Rummage CMA spent 16 minutes on phone with patient     [facility]  Provider: Marlowe Sax, NP   Code Status: FULL Goals of Care:  Advanced Directives 11/16/2018  Does Patient Have a Medical Advance Directive? No  Does patient want to make changes to medical advance directive? -  Copy of [place] in Chart? -  Would patient like information on creating a medical advance directive? -  Pre-existing out of facility DNR order (yellow form or pink MOST form) -     Chief Complaint  Patient presents with  . Acute Visit    Stomach issues diarrhea x2 weeks     HPI: Patient is a 78 y.o. female seen today   Past Medical History:  Diagnosis Date  . Allergy   . 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 Bunion 07/01/2010  . Depression 05/05/2007  . External hemorrhoids without mention of complication [id number]  . GERD (gastroesophageal reflux disease) 2000  . Hyperlipidemia 06/29/2006  . Memory loss 04/15/2003  . Migraine without aura, without mention of intractable migraine without mention of status migrainosus 03/24/1999  . Osteoporosis, unspecified 04/15/2004  . 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 Unspecified essential hypertension 11/22/2007    Past Surgical History:  Procedure Laterality Date  . COLONOSCOPY  06-16-2007   Internal hemorrhoids,laxity of anal sphincter,polyp at 25cm form anal verge, tortuous sigmoid colon. Dr.Orr   .  EYE SURGERY Bilateral 2010   cataract Dr. Bing Plume  . VAGINAL HYSTERECTOMY  1980    Allergies  Allergen Reactions  . Vioxx [Rofecoxib] Nausea Only    Pt. Can't remember what reaction was but asks to leave on her profile.     Outpatient Encounter Medications as of 04/06/2019  Medication Sig  . Ascorbic Acid (VITAMIN C) 1000 MG tablet Take 1,000 mg by mouth daily.  Marland Kitchen aspirin 81 MG tablet Take 81 mg by mouth daily. Take 1 tablet daily to prevent heart attack, stroke and clotting.  . Calcium-Magnesium-Vitamin D (CALCIUM 1200+D3 PO) Take by mouth daily.  . Cholecalciferol (HM VITAMIN D3) 2000 UNITS CAPS Take by mouth. Take 1 tablet daily  . CINNAMON PO Take 1,000 mg by mouth daily.  . Cranberry-Vitamin C-Vitamin E (CRANBERRY PLUS VITAMIN C) 4200-20-3 MG-MG-UNIT CAPS Take by mouth daily.  . cycloSPORINE (RESTASIS) 0.05 % ophthalmic emulsion 1 drop 2 (two) times daily.  Marland Kitchen losartan (COZAAR) 25 MG tablet TAKE 1 TABLET BY MOUTH EVERY DAY  . Melatonin 3 MG CAPS Take by mouth as needed.  . Multiple Vitamin (MULTI VITAMIN DAILY PO) Take by mouth. Centrum Silver for Women Take 1 tablet daily  . Multiple Vitamins-Minerals (ICAPS AREDS FORMULA PO) Take by mouth 2 (two) times daily.  . rosuvastatin (CRESTOR) 10 MG tablet 1 whole tablet 10 mg by mouth daily for high cholesterol   No facility-administered encounter medications on file as of 04/06/2019.     Review of Systems:  Review of Systems  Health Maintenance  Topic Date Due  . TETANUS/TDAP  12/25/2015  . MAMMOGRAM  06/09/2018  . INFLUENZA VACCINE  03/03/2019  . COLONOSCOPY  05/13/2022  . DEXA SCAN  Completed  . PNA vac Low Risk Adult  Completed    Physical Exam: There were no vitals filed for this visit. There is no height or weight on file to calculate BMI. Physical Exam  Labs reviewed: Basic Metabolic Panel: Recent Labs    07/11/18 1616 10/17/18 0902 12/14/18 0906  NA 142 139 141  K 4.0 4.7 4.4  CL 106 104 107  CO2 26 25 29    GLUCOSE 112 101* 102*  BUN 19 17 17   CREATININE 0.81 0.92 0.90  CALCIUM 9.9 9.4 9.6   Liver Function Tests: Recent Labs    07/11/18 1616 10/17/18 0902 12/14/18 0906  AST 19 24 20   ALT 12 15 14   BILITOT 0.2 0.4 0.4  PROT 6.6 6.5 6.7   Recent Labs    07/11/18 1616  LIPASE 22  AMYLASE 87   No results for input(s): AMMONIA in the last 8760 hours. CBC: Recent Labs    07/11/18 1616 10/17/18 0902  WBC 5.6 4.8  NEUTROABS 3,674 3,000  HGB 12.6 13.1  HCT 37.5 38.1  MCV 91.0 89.9  PLT 286 261   Lipid Panel: Recent Labs    10/17/18 0902 12/14/18 0906  CHOL 268* 195  HDL 67 65  LDLCALC 172* 107*  TRIG 149 119  CHOLHDL 4.0 3.0    Procedures since last visit:  Assessment/Plan There are no diagnoses linked to this encounter.   Labs/tests ordered:  * No order type specified * Next appt:  04/26/2019  This encounter was created in error - please disregard.

## 2019-04-10 ENCOUNTER — Other Ambulatory Visit: Payer: Self-pay

## 2019-04-10 ENCOUNTER — Ambulatory Visit (INDEPENDENT_AMBULATORY_CARE_PROVIDER_SITE_OTHER): Payer: Medicare Other | Admitting: Family

## 2019-04-10 ENCOUNTER — Ambulatory Visit: Payer: Medicare Other | Admitting: Family

## 2019-04-10 ENCOUNTER — Encounter: Payer: Self-pay | Admitting: Family

## 2019-04-10 DIAGNOSIS — R197 Diarrhea, unspecified: Secondary | ICD-10-CM

## 2019-04-10 MED ORDER — ROSUVASTATIN CALCIUM 10 MG PO TABS: ORAL_TABLET | ORAL | 1 refills | Status: DC

## 2019-04-10 NOTE — Patient Instructions (Signed)
Take over the counter imodium

## 2019-04-10 NOTE — Progress Notes (Signed)
This service is provided via telemedicine  No vital signs collected/recorded due to the encounter was a telemedicine visit.   Location of patient (ex: home, work):  Home   Patient consents to a telephone visit:  Yes  Location of the provider (ex: office, home): Office   Name of any referring provider: Sherrie Mustache, NP   Names of all persons participating in the telemedicine service and their role in the encounter:    NP, Ruthell Rummage CMA, Robyne Askew   Time spent on call:  Ruthell Rummage CMA, spent 17 minutes on phone with patient.     [facility]  Provider: Marlowe Sax, NP   Code Status: FULL Goals of Care:  Advanced Directives 11/16/2018  Does Patient Have a Medical Advance Directive? No  Does patient want to make changes to medical advance directive? -  Copy of [place] in Chart? -  Would patient like information on creating a medical advance directive? -  Pre-existing out of facility DNR order (yellow form or pink MOST form) -     Chief Complaint  Patient presents with  . Acute Visit    Diarrhea x 2 weeks patient states today she had diarrhea early this morning and seems like when she eats it's like "water"   . Medication Management    Patient states she has been trazodone 50 mg tab once a day to help with sleep. Patient states this was old prescription and only     HPI: Patient is a 78 y.o. female seen today for an acute visit for Diarrhea x 2 weeks patient states today she had diarrhea early this morning and seems like when she eats it's like "water" gushing out..she had some imodium which has help this morning.she had crackers but did not eat meals because she was going for a funeral.She went for a vacation and eat out at times though diarrhea had already started prior to her vacation.stomach feels like it's irritated.She denies any blood in the stool ,fever,chills or abdominal pain. On chart review,note patient has had  diarrhea on and off as noted by PCP Sherrie Mustache NP 07/12/2018 stool culture and ova and parasite were negative.  Past Medical History:  Diagnosis Date  . Allergy   . 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 Bunion 07/01/2010  . Depression 05/05/2007  . External hemorrhoids without mention of complication [id number]  . GERD (gastroesophageal reflux disease) 2000  . Hyperlipidemia 06/29/2006  . Memory loss 04/15/2003  . Migraine without aura, without mention of intractable migraine without mention of status migrainosus 03/24/1999  . Osteoporosis, unspecified 04/15/2004  . 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 Unspecified essential hypertension 11/22/2007    Past Surgical History:  Procedure Laterality Date  . COLONOSCOPY  06-16-2007   Internal hemorrhoids,laxity of anal sphincter,polyp at 25cm form anal verge, tortuous sigmoid colon. Dr.Orr   . EYE SURGERY Bilateral 2010   cataract Dr. Bing Plume  . VAGINAL HYSTERECTOMY  1980    Allergies  Allergen Reactions  . Vioxx [Rofecoxib] Nausea Only    Pt. Can't remember what reaction was but asks to leave on her profile.     Outpatient Encounter Medications as of 04/10/2019  Medication Sig  . Ascorbic Acid (VITAMIN C) 1000 MG tablet Take 1,000 mg by mouth daily.  Marland Kitchen aspirin 81 MG tablet Take 81 mg by mouth daily. Take 1 tablet daily to prevent heart attack, stroke and clotting.  . Calcium-Magnesium-Vitamin D (CALCIUM 1200+D3 PO) Take by mouth daily.  Marland Kitchen  Cholecalciferol (HM VITAMIN D3) 2000 UNITS CAPS Take by mouth. Take 1 tablet daily  . Cranberry-Vitamin C-Vitamin E (CRANBERRY PLUS VITAMIN C) 4200-20-3 MG-MG-UNIT CAPS Take by mouth daily.  . cycloSPORINE (RESTASIS) 0.05 % ophthalmic emulsion 1 drop 2 (two) times daily.  Marland Kitchen losartan (COZAAR) 25 MG tablet TAKE 1 TABLET BY MOUTH EVERY DAY  . Melatonin 3 MG CAPS Take by mouth as needed.  . Multiple Vitamin (MULTI VITAMIN DAILY PO) Take by mouth. Centrum Silver for Women Take 1  tablet daily  . Multiple Vitamins-Minerals (ICAPS AREDS FORMULA PO) Take by mouth 2 (two) times daily.  . rosuvastatin (CRESTOR) 10 MG tablet 1 whole tablet 10 mg by mouth daily for high cholesterol  . CINNAMON PO Take 1,000 mg by mouth daily.   No facility-administered encounter medications on file as of 04/10/2019.     Review of Systems:  Review of Systems  Constitutional: Negative for activity change, chills, fatigue and fever.       Unable to eat solid food due to diarrhea   Respiratory: Negative for cough, chest tightness, shortness of breath and wheezing.   Cardiovascular: Negative for chest pain, palpitations and leg swelling.  Gastrointestinal: Positive for diarrhea. Negative for abdominal distention, abdominal pain, blood in stool, constipation, nausea and vomiting.  Genitourinary: Negative for difficulty urinating, dysuria, flank pain, frequency and urgency.  Musculoskeletal: Negative for arthralgias and myalgias.  Skin: Negative for color change, pallor and rash.  Neurological: Negative for dizziness, light-headedness and headaches.  Psychiatric/Behavioral: Negative for agitation, confusion and sleep disturbance. The patient is not nervous/anxious.     Health Maintenance  Topic Date Due  . MAMMOGRAM  06/09/2018  . INFLUENZA VACCINE  03/03/2019  . COLONOSCOPY  05/13/2022  . TETANUS/TDAP  11/16/2028  . DEXA SCAN  Completed  . PNA vac Low Risk Adult  Completed    Physical Exam: There were no vitals filed for this visit. There is no height or weight on file to calculate BMI. Physical Exam  Unable to complete on telephone visit.   Labs reviewed: Basic Metabolic Panel: Recent Labs    07/11/18 1616 10/17/18 0902 12/14/18 0906  NA 142 139 141  K 4.0 4.7 4.4  CL 106 104 107  CO2 26 25 29   GLUCOSE 112 101* 102*  BUN 19 17 17   CREATININE 0.81 0.92 0.90  CALCIUM 9.9 9.4 9.6   Liver Function Tests: Recent Labs    07/11/18 1616 10/17/18 0902 12/14/18 0906  AST  19 24 20   ALT 12 15 14   BILITOT 0.2 0.4 0.4  PROT 6.6 6.5 6.7   Recent Labs    07/11/18 1616  LIPASE 22  AMYLASE 87   No results for input(s): AMMONIA in the last 8760 hours. CBC: Recent Labs    07/11/18 1616 10/17/18 0902  WBC 5.6 4.8  NEUTROABS 3,674 3,000  HGB 12.6 13.1  HCT 37.5 38.1  MCV 91.0 89.9  PLT 286 261   Lipid Panel: Recent Labs    10/17/18 0902 12/14/18 0906  CHOL 268* 195  HDL 67 65  LDLCALC 172* 107*  TRIG 149 119  CHOLHDL 4.0 3.0    Procedures since last visit:  Assessment/Plan   Diarrhea, unspecified type No fever reported.on chart review diarrhea seems to be chronic.No blood in the stool or abdominal pain reported.Encouraged to increase fluid intake and eat as tolerated.Take over the counter imodium.Notify provider if symptoms worsen.  - refer to ambulatory Gastroenterology for evaluation of on going diarrhea  Labs/tests ordered: None  Next appt:  04/26/2019 Spent 14 minutes of non-face to face with patient

## 2019-04-17 ENCOUNTER — Ambulatory Visit: Payer: Medicare Other | Admitting: Nurse Practitioner

## 2019-04-18 ENCOUNTER — Other Ambulatory Visit: Payer: Self-pay | Admitting: Nurse Practitioner

## 2019-04-18 ENCOUNTER — Ambulatory Visit (INDEPENDENT_AMBULATORY_CARE_PROVIDER_SITE_OTHER): Payer: Medicare Other | Admitting: Gastroenterology

## 2019-04-18 ENCOUNTER — Encounter: Payer: Self-pay | Admitting: Gastroenterology

## 2019-04-18 ENCOUNTER — Other Ambulatory Visit: Payer: Medicare Other

## 2019-04-18 ENCOUNTER — Other Ambulatory Visit (INDEPENDENT_AMBULATORY_CARE_PROVIDER_SITE_OTHER): Payer: Medicare Other

## 2019-04-18 VITALS — BP 160/80 | HR 72 | Temp 98.2°F | Ht 60.0 in | Wt 101.0 lb

## 2019-04-18 DIAGNOSIS — R197 Diarrhea, unspecified: Secondary | ICD-10-CM

## 2019-04-18 DIAGNOSIS — R110 Nausea: Secondary | ICD-10-CM

## 2019-04-18 DIAGNOSIS — R112 Nausea with vomiting, unspecified: Secondary | ICD-10-CM | POA: Insufficient documentation

## 2019-04-18 DIAGNOSIS — E785 Hyperlipidemia, unspecified: Secondary | ICD-10-CM

## 2019-04-18 LAB — CBC WITH DIFFERENTIAL/PLATELET
Basophils Absolute: 0 10*3/uL (ref 0.0–0.1)
Basophils Relative: 1.1 % (ref 0.0–3.0)
Eosinophils Absolute: 0.1 10*3/uL (ref 0.0–0.7)
Eosinophils Relative: 1.8 % (ref 0.0–5.0)
HCT: 41.1 % (ref 36.0–46.0)
Hemoglobin: 13.8 g/dL (ref 12.0–15.0)
Lymphocytes Relative: 32.2 % (ref 12.0–46.0)
Lymphs Abs: 1.4 10*3/uL (ref 0.7–4.0)
MCHC: 33.6 g/dL (ref 30.0–36.0)
MCV: 91.1 fl (ref 78.0–100.0)
Monocytes Absolute: 0.4 10*3/uL (ref 0.1–1.0)
Monocytes Relative: 8.6 % (ref 3.0–12.0)
Neutro Abs: 2.5 10*3/uL (ref 1.4–7.7)
Neutrophils Relative %: 56.3 % (ref 43.0–77.0)
Platelets: 296 10*3/uL (ref 150.0–400.0)
RBC: 4.51 Mil/uL (ref 3.87–5.11)
RDW: 13.1 % (ref 11.5–15.5)
WBC: 4.4 10*3/uL (ref 4.0–10.5)

## 2019-04-18 LAB — BASIC METABOLIC PANEL
BUN: 17 mg/dL (ref 6–23)
CO2: 30 mEq/L (ref 19–32)
Calcium: 10.4 mg/dL (ref 8.4–10.5)
Chloride: 101 mEq/L (ref 96–112)
Creatinine, Ser: 0.8 mg/dL (ref 0.40–1.20)
GFR: 69.11 mL/min (ref >60.00)
Glucose, Bld: 75 mg/dL (ref 70–99)
Potassium: 4.2 mEq/L (ref 3.5–5.1)
Sodium: 139 mEq/L (ref 135–145)

## 2019-04-18 MED ORDER — ONDANSETRON HCL 4 MG PO TABS: ORAL_TABLET | ORAL | 0 refills | Status: DC

## 2019-04-18 NOTE — Progress Notes (Addendum)
04/18/2019 Caroline Snyder [id number] 1940/12/03   HISTORY OF PRESENT ILLNESS: This is a pleasant 78 year old female who is a patient of Dr. Doyne Keel.  She had colonoscopy in October 2018 at which time she was found to have one polyp that was removed and was a tubular adenoma.  Also had diverticulosis in the sigmoid colon.  She presents to our office today with complaints of diarrhea.  She says that she had sudden onset of diarrhea about 3 weeks ago when she was at the beach.  Initially it was absolutely terrible and was just like water coming out.  Sounds like she probably had some episodes of incontinence as well.  Since then it has improved to a degree, but she has been eating very little and very bland foods.  She says that today it is looking like it is starting to maybe firm up a little bit.  Was having nocturnal stools in the beginning, but not so much anymore.  Denies rectal bleeding.  Denies abdominal pain.  She does report some sick feeling in her stomach like nausea, but no vomiting.  Reports about 3 or 4 pounds weight loss.  Of note, it looks like she had stool studies performed back in December 2019, which were negative.  She says that every once in a while if she eats something that does not agree with her then she will get diarrhea, but nothing that has ever been this severe in the past.  No reports of chronic diarrhea issues.  She reports taking Tums and Pepto-Bismol for her symptoms.  She says that she has been eating probiotic yogurt and just started an over-the-counter probiotic a couple of days ago.   Past Medical History:  Diagnosis Date  . Allergy   . 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 Bunion 07/01/2010  . Depression 05/05/2007  . External hemorrhoids without mention of complication [id number]  . GERD (gastroesophageal reflux disease) 2000  . Hyperlipidemia 06/29/2006  . Memory loss 04/15/2003  . Migraine without aura, without mention of intractable migraine  without mention of status migrainosus 03/24/1999  . Osteoporosis, unspecified 04/15/2004  . 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 Unspecified essential hypertension 11/22/2007   Past Surgical History:  Procedure Laterality Date  . COLONOSCOPY  06-16-2007   Internal hemorrhoids,laxity of anal sphincter,polyp at 25cm form anal verge, tortuous sigmoid colon. Dr.Orr   . EYE SURGERY Bilateral 2010   cataract Dr. Bing Plume  . VAGINAL HYSTERECTOMY  1980    reports that she has never smoked. She has never used smokeless tobacco. She reports that she does not drink alcohol or use drugs. family history includes Alcohol abuse in her father; Alzheimer's disease in her mother; Diabetes in her mother; Heart disease in her brother and mother; Hypertension in her brother, brother, brother, sister, and sister; Kidney disease in her father; Other in her son; Transient ischemic attack in her mother. Allergies  Allergen Reactions  . Vioxx [Rofecoxib] Nausea Only    Pt. Can't remember what reaction was but asks to leave on her profile.       Outpatient Encounter Medications as of 04/18/2019  Medication Sig  . Ascorbic Acid (VITAMIN C) 1000 MG tablet Take 1,000 mg by mouth daily.  Marland Kitchen aspirin 81 MG tablet Take 81 mg by mouth daily. Take 1 tablet daily to prevent heart attack, stroke and clotting.  . Calcium-Magnesium-Vitamin D (CALCIUM 1200+D3 PO) Take by mouth daily.  . Cholecalciferol (HM VITAMIN D3) 2000 UNITS CAPS  Take by mouth. Take 1 tablet daily  . Cranberry-Vitamin C-Vitamin E (CRANBERRY PLUS VITAMIN C) 4200-20-3 MG-MG-UNIT CAPS Take by mouth daily.  . cycloSPORINE (RESTASIS) 0.05 % ophthalmic emulsion 1 drop 2 (two) times daily.  Marland Kitchen losartan (COZAAR) 25 MG tablet TAKE 1 TABLET BY MOUTH EVERY DAY  . Melatonin 3 MG CAPS Take by mouth as needed.  . Multiple Vitamin (MULTI VITAMIN DAILY PO) Take by mouth. Centrum Silver for Women Take 1 tablet daily  . Multiple Vitamins-Minerals (ICAPS AREDS  FORMULA PO) Take by mouth 2 (two) times daily.  . rosuvastatin (CRESTOR) 10 MG tablet 1 whole tablet 10 mg by mouth daily for high cholesterol  . CINNAMON PO Take 1,000 mg by mouth daily.   No facility-administered encounter medications on file as of 04/18/2019.      REVIEW OF SYSTEMS  : All other systems reviewed and negative except where noted in the History of Present Illness.   PHYSICAL EXAM: BP (!) 160/80   Pulse 72   Temp 98.2 F (36.8 C)   Ht 5' (1.524 m)   Wt 101 lb (45.8 kg)   BMI 19.73 kg/m  General: Well developed white female in no acute distress Head: Normocephalic and atraumatic Eyes:  Sclerae anicteric, conjunctiva pink. Ears: Normal auditory acuity Lungs: Clear throughout to auscultation; no increased WOB. Heart: Regular rate and rhythm; no M/R/G. Abdomen: Soft, non-distended.  BS present.  Non-tender. Musculoskeletal: Symmetrical with no gross deformities  Skin: No lesions on visible extremities Extremities: No edema  Neurological: Alert oriented x 4, grossly non-focal Psychological:  Alert and cooperative. Normal mood and affect  ASSESSMENT AND PLAN: *78 year old female with complaints of diarrhea that was rather sudden in onset about 3 weeks ago.  Initially was very severe, but has improved to some degree since that time.  I think that this is likely an infectious source.  Looks like she had complaints of diarrhea back in December 2019 and had stool studies done at that time, but reports no complaints of diarrhea chronically.  Will check stool studies for C. difficile as well as a GI pathogen panel.  We will check labs today including CBC and BMP.  I have asked her to maintain on a brat diet, but slowly increase her oral intake but remain on very bland foods.  Advised her to maintain good fluid intake.  Recently start a probiotic, specifically recommended Florastor twice daily.  Has some nausea as well so I am going to give her prescription for Zofran to use as  needed.  Will follow-up results.   CC:  [name], [name], NP    Agree with assessment and plan as outlined. [name], MD [facility]

## 2019-04-18 NOTE — Patient Instructions (Signed)
Your provider has requested that you go to the basement level for lab work before leaving today. Press "B" on the elevator. The lab is located at the first door on the left as you exit the elevator.  Take over the counter Florastor probiotic twice daily.

## 2019-04-20 LAB — CLOSTRIDIUM DIFFICILE TOXIN B, QUALITATIVE, REAL-TIME PCR: Toxigenic C. Difficile by PCR: NOT DETECTED

## 2019-04-20 LAB — GASTROINTESTINAL PATHOGEN PANEL PCR
C. difficile Tox A/B, PCR: NOT DETECTED
Campylobacter, PCR: NOT DETECTED
Cryptosporidium, PCR: NOT DETECTED
E coli (ETEC) LT/ST PCR: NOT DETECTED
E coli (STEC) stx1/stx2, PCR: NOT DETECTED
E coli 0157, PCR: NOT DETECTED
Giardia lamblia, PCR: NOT DETECTED
Norovirus, PCR: NOT DETECTED
Rotavirus A, PCR: NOT DETECTED
Salmonella, PCR: NOT DETECTED
Shigella, PCR: NOT DETECTED

## 2019-04-24 ENCOUNTER — Ambulatory Visit: Payer: Medicare Other | Admitting: Nurse Practitioner

## 2019-04-26 ENCOUNTER — Other Ambulatory Visit: Payer: Self-pay

## 2019-04-26 ENCOUNTER — Encounter: Payer: Self-pay | Admitting: Family

## 2019-04-26 ENCOUNTER — Ambulatory Visit (INDEPENDENT_AMBULATORY_CARE_PROVIDER_SITE_OTHER): Payer: Medicare Other | Admitting: Family

## 2019-04-26 ENCOUNTER — Encounter: Payer: Medicare Other | Admitting: Obstetrics & Gynecology

## 2019-04-26 ENCOUNTER — Ambulatory Visit: Payer: Medicare Other | Admitting: Nurse Practitioner

## 2019-04-26 VITALS — BP 130/70 | HR 63 | Temp 97.5°F | Ht 60.0 in | Wt 101.4 lb

## 2019-04-26 DIAGNOSIS — E782 Mixed hyperlipidemia: Secondary | ICD-10-CM | POA: Diagnosis not present

## 2019-04-26 DIAGNOSIS — G4700 Insomnia, unspecified: Secondary | ICD-10-CM

## 2019-04-26 DIAGNOSIS — Z1231 Encounter for screening mammogram for malignant neoplasm of breast: Secondary | ICD-10-CM | POA: Diagnosis not present

## 2019-04-26 DIAGNOSIS — Z23 Encounter for immunization: Secondary | ICD-10-CM

## 2019-04-26 DIAGNOSIS — K2100 Gastro-esophageal reflux disease with esophagitis, without bleeding: Secondary | ICD-10-CM

## 2019-04-26 DIAGNOSIS — I10 Essential (primary) hypertension: Secondary | ICD-10-CM | POA: Diagnosis not present

## 2019-04-26 LAB — LIPID PANEL
Cholesterol: 224 mg/dL — ABNORMAL HIGH (ref <200)
HDL: 66 mg/dL (ref >=50)
LDL Cholesterol (Calc): 137 mg/dL (calc) — ABNORMAL HIGH
Non-HDL Cholesterol (Calc): 158 mg/dL (calc) — ABNORMAL HIGH (ref <130)
Total CHOL/HDL Ratio: 3.4 (calc) (ref <5.0)
Triglycerides: 104 mg/dL (ref <150)

## 2019-04-26 MED ORDER — TRAZODONE HCL 50 MG PO TABS: 50.0000 mg | ORAL_TABLET | Freq: Every day | ORAL | 3 refills | Status: DC

## 2019-04-26 NOTE — Progress Notes (Signed)
Provider: Kayler Rise FNP-C   [name], NP  Patient Care Team: [name], NP as PCP - General (Geriatric Medicine) [name], MD as Consulting Physician (Ophthalmology) Calvert Cantor, MD as Consulting Physician (Ophthalmology) Terrance Mass, MD (Inactive) as Consulting Physician (Gynecology)  Extended Emergency Contact Information Primary Emergency Contact: Carma Lair of [place] Phone: [phone] Relation: Son Secondary Emergency Contact: [place] Phone: [phone] Relation: Other  Code Status:  Full Code  Goals of care: Advanced Directive information Advanced Directives 11/16/2018  Does Patient Have a Medical Advance Directive? No  Does patient want to make changes to medical advance directive? -  Copy of [place] in Chart? -  Would patient like information on creating a medical advance directive? -  Pre-existing out of facility DNR order (yellow form or pink MOST form) -     Chief Complaint  Patient presents with  . Medical Management of Chronic Issues    4 month follow up   . Quality Metric Gaps    Mammogram and Influenza vaccine     HPI:  Pt is a 78 y.o. female seen today for medical management of chronic diseases.she states previous diarrhea has resolved.she complains of inability to sleep at night.she states able to fall asleep without any problems but wakes around 1-2 am unable to sleep.she states her son was killed in [place] around 2 am driving a truck.she has also had two husband who passed away.she is emotion talking about it.she declines to take an antidepressant but agrees to taking something to help sleep.Melatonin not effective.   Hypertension - checks blood pressure at home but does not record.Recalls readings sometimes in the 140's/70's when she worries.takes losartan 25 mg tablet daily.on ASA and Crestor 10 mg tablet daily.she denies any  headache,dizziness,faintness,chest pain or shortness of breath.  GERD - symptoms under control.takes cinnamon.  Hyperlipidemia - has not been able to exercise by dancing due to COVID-19 restrictions.Discussed walking exercise.she does not cook much.she lives alone.takes Crestor 10 mg tablet daily.    Immunization - Due for influenza vaccine.she denies any s/sx's of URI  Due for her mammogram.   Past Medical History:  Diagnosis Date  . Allergy   . 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 Bunion 07/01/2010  . Depression 05/05/2007  . External hemorrhoids without mention of complication 89/38/1017  . GERD (gastroesophageal reflux disease) 2000  . Hyperlipidemia 06/29/2006  . Memory loss 04/15/2003  . Migraine without aura, without mention of intractable migraine without mention of status migrainosus 03/24/1999  . Osteoporosis, unspecified 04/15/2004  . 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 Unspecified essential hypertension 11/22/2007   Past Surgical History:  Procedure Laterality Date  . COLONOSCOPY  06-16-2007   Internal hemorrhoids,laxity of anal sphincter,polyp at 25cm form anal verge, tortuous sigmoid colon. Dr.Orr   . EYE SURGERY Bilateral 2010   cataract Dr. Bing Plume  . VAGINAL HYSTERECTOMY  1980    Allergies  Allergen Reactions  . Vioxx [Rofecoxib] Nausea Only    Pt. Can't remember what reaction was but asks to leave on her profile.     Allergies as of 04/26/2019      Reactions   Vioxx [rofecoxib] Nausea Only   Pt. Can't remember what reaction was but asks to leave on her profile.       Medication List       Accurate as of April 26, 2019  9:23 AM. If you have any questions, ask your nurse  or doctor.        aspirin 81 MG tablet Take 81 mg by mouth daily. Take 1 tablet daily to prevent heart attack, stroke and clotting.   CALCIUM 1200+D3 PO Take by mouth daily.   CINNAMON PO 1000 mg po qd What changed: Another medication with the same name was  removed. Continue taking this medication, and follow the directions you see here. Changed by: Sandrea Hughs, NP   Cranberry Plus Vitamin C 4200-20-3 MG-MG-UNIT Caps Generic drug: Cranberry-Vitamin C-Vitamin E Take by mouth daily.   HM Vitamin D3 50 MCG (2000 UT) Caps Generic drug: Cholecalciferol Take by mouth. Take 1 tablet daily   ICAPS AREDS FORMULA PO Take by mouth 2 (two) times daily.   losartan 25 MG tablet Commonly known as: COZAAR TAKE 1 TABLET BY MOUTH EVERY DAY   Melatonin 3 MG Caps Take by mouth as needed.   MULTI VITAMIN DAILY PO Take by mouth. Centrum Silver for Women Take 1 tablet daily   ondansetron 4 MG tablet Commonly known as: ZOFRAN Take one tablet by mouth every 6-8 hours as needed   Restasis 0.05 % ophthalmic emulsion Generic drug: cycloSPORINE 1 drop 2 (two) times daily.   rosuvastatin 10 MG tablet Commonly known as: CRESTOR 1 whole tablet 10 mg by mouth daily for high cholesterol   vitamin C 1000 MG tablet Take 1,000 mg by mouth daily.       Review of Systems  Constitutional: Negative for appetite change, chills, fatigue and fever.  HENT: Negative for congestion, rhinorrhea, sinus pressure, sinus pain, sneezing and sore throat.   Eyes: Positive for visual disturbance. Negative for discharge, redness and itching.       Wears eye glasses  Respiratory: Negative for cough, chest tightness, shortness of breath and wheezing.   Gastrointestinal: Negative for abdominal distention, abdominal pain, constipation, diarrhea, nausea and vomiting.  Endocrine: Negative for cold intolerance, heat intolerance, polydipsia, polyphagia and polyuria.  Genitourinary: Negative for difficulty urinating, dysuria, flank pain, frequency and urgency.  Musculoskeletal: Negative for arthralgias and gait problem.  Skin: Negative for color change, pallor and rash.  Neurological: Negative for dizziness, weakness, light-headedness, numbness and headaches.  Hematological:  Does not bruise/bleed easily.  Psychiatric/Behavioral: Positive for sleep disturbance. Negative for agitation. The patient is not nervous/anxious.     Immunization History  Administered Date(s) Administered  . Fluad Quad(high Dose 65+) 04/26/2019  . Influenza, High Dose Seasonal PF 03/11/2017, 06/09/2018  . Influenza,inj,Quad PF,6+ Mos 07/19/2013, 05/28/2016, 06/02/2017  . Influenza-Unspecified 05/23/2015  . PPD Test 11/30/2013, 05/30/2015  . Pneumococcal Conjugate-13 05/28/2016  . Pneumococcal Polysaccharide-23 06/09/2018  . Tdap 11/17/2018  . Zoster 11/19/2009   Pertinent  Health Maintenance Due  Topic Date Due  . MAMMOGRAM  06/09/2018  . INFLUENZA VACCINE  03/03/2019  . COLONOSCOPY  05/13/2022  . DEXA SCAN  Completed  . PNA vac Low Risk Adult  Completed   Fall Risk  04/26/2019 04/06/2019 11/16/2018 10/17/2018 07/18/2018  Falls in the past year? 0 0 0 0 0  Number falls in past yr: 0 0 0 0 0  Injury with Fall? 0 0 0 0 0    Vitals:   04/26/19 0901  BP: 130/70  Pulse: 63  Temp: (!) 97.5 F (36.4 C)  TempSrc: Temporal  SpO2: 96%  Weight: 101 lb 6.4 oz (46 kg)  Height: 5' (1.524 m)   Body mass index is 19.8 kg/m. Physical Exam Constitutional:      General: She is not in  acute distress.    Appearance: She is normal weight. She is not ill-appearing.  HENT:     Head: Normocephalic.     Right Ear: Tympanic membrane, ear canal and external ear normal. There is no impacted cerumen.     Left Ear: Tympanic membrane, ear canal and external ear normal. There is no impacted cerumen.     Nose: Nose normal. No congestion or rhinorrhea.     Mouth/Throat:     Mouth: Mucous membranes are moist.     Pharynx: Oropharynx is clear. No oropharyngeal exudate or posterior oropharyngeal erythema.  Eyes:     General: No scleral icterus.       Right eye: No discharge.        Left eye: No discharge.     Extraocular Movements: Extraocular movements intact.     Conjunctiva/sclera: Conjunctivae  normal.     Pupils: Pupils are equal, round, and reactive to light.  Neck:     Musculoskeletal: Normal range of motion. No neck rigidity or muscular tenderness.     Vascular: No carotid bruit.  Cardiovascular:     Rate and Rhythm: Normal rate and regular rhythm.     Pulses: Normal pulses.     Heart sounds: Normal heart sounds. No murmur. No friction rub. No gallop.   Pulmonary:     Effort: Pulmonary effort is normal. No respiratory distress.     Breath sounds: Normal breath sounds. No wheezing, rhonchi or rales.  Chest:     Chest wall: No tenderness.  Abdominal:     General: Bowel sounds are normal. There is no distension.     Palpations: Abdomen is soft. There is no mass.     Tenderness: There is no abdominal tenderness. There is no right CVA tenderness, left CVA tenderness, guarding or rebound.  Musculoskeletal: Normal range of motion.        General: No swelling or tenderness.     Right lower leg: No edema.     Left lower leg: No edema.  Lymphadenopathy:     Cervical: No cervical adenopathy.  Skin:    General: Skin is warm and dry.     Coloration: Skin is not pale.     Findings: No bruising, erythema or rash.  Neurological:     Mental Status: She is alert and oriented to person, place, and time.     Cranial Nerves: No cranial nerve deficit.     Sensory: No sensory deficit.     Motor: No weakness.     Coordination: Coordination normal.     Gait: Gait normal.     Deep Tendon Reflexes: Reflexes normal.  Psychiatric:        Mood and Affect: Mood normal.        Speech: Speech normal.        Thought Content: Thought content normal.        Cognition and Memory: She exhibits impaired recent memory.        Judgment: Judgment normal.    Labs reviewed: Recent Labs    10/17/18 0902 12/14/18 0906 04/18/19 1057  NA 139 141 139  K 4.7 4.4 4.2  CL 104 107 101  CO2 _0 GLUCOSE 101* 102* 75  BUN _1 CREATININE 0.92 0.90 0.80  CALCIUM 9.4 9.6 10.4   Recent Labs     07/11/18 1616 10/17/18 0902 12/14/18 0906  AST _2 ALT _3 BILITOT 0.2 0.4 0.4  PROT 6.6 6.5 6.7   Recent Labs    07/11/18 1616 10/17/18 0902 04/18/19 1057  WBC 5.6 4.8 4.4  NEUTROABS 3,674 3,000 2.5  HGB 12.6 13.1 13.8  HCT 37.5 38.1 41.1  MCV 91.0 89.9 91.1  PLT 286 261 296.0   Lab Results  Component Value Date   TSH 2.05 03/11/2017   No results found for: HGBA1C Lab Results  Component Value Date   CHOL 195 12/14/2018   HDL 65 12/14/2018   LDLCALC 107 (H) 12/14/2018   TRIG 119 12/14/2018   CHOLHDL 3.0 12/14/2018    Significant Diagnostic Results in last 30 days:  No results found.  Assessment/Plan 1. Need for influenza vaccination Afebrile.No s/Sx's of URI - Flu Vaccine QUAD High Dose(Fluad) administered by Ruthell Rummage CMA   2. Essential hypertension B/p at goal this visit.Encouraged to check B/p at home and record then bring log to visit.B/p log given this visit.continue on Losartan 25 mg tablet daily.On ASA and satin for cardiovascular event prevention.  - CBC with Differential/Platelet; Future - CMP with eGFR([facility]); Future - TSH; Future  3. Mixed hyperlipidemia Continue on Crestor 10 mg tablet daily.low carbohydrates,low saturated fats and high vegetable diet encouraged.Exercise as tolerated at least 3 x per week for 30 minutes.   - Lipid panel; Future - Lipid Panel  4. Insomnia, unspecified type Melatonin ineffective. - traZODone (DESYREL) 50 MG tablet; Take 1 tablet (50 mg total) by mouth at bedtime.  Dispense: 30 tablet; Refill: 3  5. Gastroesophageal reflux disease with esophagitis Symptoms under control.  6. Breast cancer screening by mammogram - MM Digital Screening; Future  Family/ staff Communication: Reviewed plan of care with patient.  Labs/tests ordered:  - CBC with Differential/Platelet; Future - CMP with eGFR([facility]); Future - TSH; Future - Lipid panel; Future - Lipid Panel  Next visit: 6 Months for medical  management of chronic issues.  Sandrea Hughs, NP

## 2019-04-27 ENCOUNTER — Telehealth: Payer: Self-pay

## 2019-04-27 DIAGNOSIS — E782 Mixed hyperlipidemia: Secondary | ICD-10-CM

## 2019-04-27 DIAGNOSIS — I10 Essential (primary) hypertension: Secondary | ICD-10-CM

## 2019-04-27 MED ORDER — ROSUVASTATIN CALCIUM 20 MG PO TABS: 20.0000 mg | ORAL_TABLET | Freq: Every day | ORAL | 1 refills | Status: DC

## 2019-04-27 NOTE — Telephone Encounter (Signed)
Discussed results with patient, patient verbalized understanding of results  New RX for increased dose sent to pharmacy. Copy of labs mailed. Appointment scheduled to recheck Lipids in 3 months

## 2019-04-27 NOTE — Telephone Encounter (Signed)
-----   Message from Sandrea Hughs, NP sent at 04/27/2019 11:56 AM EDT ----- Total cholesterol and LDL still high.Increase Crestor from 10 mg tablet to 20 mg tablet one by mouth daily then will recheck fasting lipid panel in 3 months.Also recommend low carbohydrates,low saturated fats and high vegetable diet and increase physical activity/exercise 3 times per week for at least 30 minutes as tolerated.

## 2019-05-15 ENCOUNTER — Other Ambulatory Visit: Payer: Self-pay

## 2019-05-17 ENCOUNTER — Encounter: Payer: Self-pay | Admitting: Obstetrics & Gynecology

## 2019-05-17 ENCOUNTER — Ambulatory Visit (INDEPENDENT_AMBULATORY_CARE_PROVIDER_SITE_OTHER): Payer: Medicare Other | Admitting: Obstetrics & Gynecology

## 2019-05-17 ENCOUNTER — Other Ambulatory Visit: Payer: Self-pay

## 2019-05-17 VITALS — BP 128/80 | Ht 59.0 in | Wt 103.0 lb

## 2019-05-17 DIAGNOSIS — L989 Disorder of the skin and subcutaneous tissue, unspecified: Secondary | ICD-10-CM

## 2019-05-17 DIAGNOSIS — Z780 Asymptomatic menopausal state: Secondary | ICD-10-CM

## 2019-05-17 DIAGNOSIS — Z90710 Acquired absence of both cervix and uterus: Secondary | ICD-10-CM

## 2019-05-17 DIAGNOSIS — Z01419 Encounter for gynecological examination (general) (routine) without abnormal findings: Secondary | ICD-10-CM

## 2019-05-17 DIAGNOSIS — Z90722 Acquired absence of ovaries, bilateral: Secondary | ICD-10-CM

## 2019-05-17 DIAGNOSIS — M810 Age-related osteoporosis without current pathological fracture: Secondary | ICD-10-CM

## 2019-05-17 DIAGNOSIS — Z9079 Acquired absence of other genital organ(s): Secondary | ICD-10-CM

## 2019-05-17 NOTE — Progress Notes (Signed)
Caroline Snyder [birth year] [id number]   History:    78 y.o. G2P2L1 Widowed.  Youngest son died almost 3 yrs ago.  Sees her oldest son, who has children.    RP:  Established patient presenting for annual gyn exam   HPI: S/P TAH/BSO.  Menopause, on no HRT.  No PMB.  No Pelvic pain.  Abstinent.  Urine/BMs wnl.  Breasts wnl.  BMI 20.12.  Improved Depression x last year.  Stopped Zoloft.  On Desyrel.  Health labs with Fam MD.   Past medical history,surgical history, family history and social history were all reviewed and documented in the EPIC chart.  Gynecologic History No LMP recorded. Patient has had a hysterectomy. Contraception: status post hysterectomy Last Pap: 2011. Results were: normal Last mammogram: 06/2017. Results were: Benign Bone Density: 05/2018 Osteopenia Colonoscopy: 2018  Obstetric History OB History  Gravida Para Term Preterm AB Living  2 2       2   SAB TAB Ectopic Multiple Live Births          2    # Outcome Date GA Lbr Len/2nd Weight Sex Delivery Anes PTL Lv  2 Para     M Vag-Spont   LIV  1 Para     M Vag-Spont   LIV    Obstetric Comments  1 son passed away in a car accident in 2017.      ROS: A ROS was performed and pertinent positives and negatives are included in the history.  GENERAL: No fevers or chills. HEENT: No change in vision, no earache, sore throat or sinus congestion. NECK: No pain or stiffness. CARDIOVASCULAR: No chest pain or pressure. No palpitations. PULMONARY: No shortness of breath, cough or wheeze. GASTROINTESTINAL: No abdominal pain, nausea, vomiting or diarrhea, melena or bright red blood per rectum. GENITOURINARY: No urinary frequency, urgency, hesitancy or dysuria. MUSCULOSKELETAL: No joint or muscle pain, no back pain, no recent trauma. DERMATOLOGIC: No rash, no itching, no lesions. ENDOCRINE: No polyuria, polydipsia, no heat or cold intolerance. No recent change in weight. HEMATOLOGICAL: No anemia or easy bruising or bleeding.  NEUROLOGIC: No headache, seizures, numbness, tingling or weakness. PSYCHIATRIC: No depression, no loss of interest in normal activity or change in sleep pattern.     Exam:   BP 128/80 (BP Location: Right Arm, Patient Position: Sitting, Cuff Size: Normal)    Ht 5' (1.524 m)    Wt 103 lb (46.7 kg)    BMI 20.12 kg/m   Body mass index is 20.12 kg/m.  General appearance : Well developed well nourished female. No acute distress HEENT: Eyes: no retinal hemorrhage or exudates,  Neck supple, trachea midline, no carotid bruits, no thyroidmegaly Lungs: Clear to auscultation, no rhonchi or wheezes, or rib retractions  Heart: Regular rate and rhythm, no murmurs or gallops Breast:Examined in sitting and supine position were symmetrical in appearance, no palpable masses or tenderness,  no skin retraction, no nipple inversion, no nipple discharge, no skin discoloration, no axillary or supraclavicular lymphadenopathy Abdomen: no palpable masses or tenderness, no rebound or guarding Extremities: no edema or skin discoloration or tenderness  Pelvic: Vulva: Normal             Vagina: No gross lesions or discharge  Cervix/Uterus absent  Adnexa  Without masses or tenderness  Anus: Normal   Assessment/Plan:  78 y.o. female for annual exam   1. Encounter for well woman exam with routine gynecological exam Gynecologic exam status post TAH/BSO.  No indication for  Pap tests anymore.  Breast exam normal.  Will schedule a screening mammogram now.  Colonoscopy in 2018.  Health labs with family physician.  2. S/P TAH-BSO  3. Postmenopause Well on no hormone replacement therapy.  4. Age-related osteoporosis without current pathological fracture Last bone density October 2019 showed osteopenia, but history of osteoporosis.  5. Skin lesion of face Referred to dermatology.  Princess Bruins MD, 4:10 PM 05/17/2019

## 2019-05-18 ENCOUNTER — Other Ambulatory Visit: Payer: Self-pay | Admitting: Family

## 2019-05-18 DIAGNOSIS — G4700 Insomnia, unspecified: Secondary | ICD-10-CM

## 2019-05-19 ENCOUNTER — Other Ambulatory Visit: Payer: Self-pay | Admitting: Nurse Practitioner

## 2019-05-19 DIAGNOSIS — I10 Essential (primary) hypertension: Secondary | ICD-10-CM

## 2019-05-24 ENCOUNTER — Telehealth: Payer: Self-pay | Admitting: *Deleted

## 2019-05-24 NOTE — Telephone Encounter (Signed)
Patient scheduled on 06/26/19 @ 10:45am with PA [place]. Patient informed. No notes required via fax per Dermatology specialist office.

## 2019-05-24 NOTE — Telephone Encounter (Signed)
-----   Message from Princess Bruins, MD sent at 05/17/2019  4:33 PM EDT ----- Regarding: Refer to [place] skin lesions on forehead and full skin exam.

## 2019-05-25 ENCOUNTER — Encounter: Payer: Self-pay | Admitting: Obstetrics & Gynecology

## 2019-05-25 NOTE — Patient Instructions (Signed)
1. Encounter for well woman exam with routine gynecological exam Gynecologic exam status post TAH/BSO.  No indication for Pap tests anymore.  Breast exam normal.  Will schedule a screening mammogram now.  Colonoscopy in 2018.  Health labs with family physician.  2. S/P TAH-BSO  3. Postmenopause Well on no hormone replacement therapy.  4. Age-related osteoporosis without current pathological fracture Last bone density October 2019 showed osteopenia, but history of osteoporosis.  5. Skin lesion of face Referred to dermatology.  Caroline Snyder, it was a pleasure seeing you today!

## 2019-06-11 ENCOUNTER — Encounter: Payer: Self-pay | Admitting: Nurse Practitioner

## 2019-06-11 ENCOUNTER — Ambulatory Visit (INDEPENDENT_AMBULATORY_CARE_PROVIDER_SITE_OTHER): Payer: Medicare Other | Admitting: Nurse Practitioner

## 2019-06-11 ENCOUNTER — Ambulatory Visit: Payer: Self-pay

## 2019-06-11 ENCOUNTER — Other Ambulatory Visit: Payer: Self-pay

## 2019-06-11 DIAGNOSIS — R110 Nausea: Secondary | ICD-10-CM

## 2019-06-11 DIAGNOSIS — R519 Headache, unspecified: Secondary | ICD-10-CM

## 2019-06-11 DIAGNOSIS — E782 Mixed hyperlipidemia: Secondary | ICD-10-CM | POA: Diagnosis not present

## 2019-06-11 DIAGNOSIS — Z20828 Contact with and (suspected) exposure to other viral communicable diseases: Secondary | ICD-10-CM | POA: Diagnosis not present

## 2019-06-11 DIAGNOSIS — I10 Essential (primary) hypertension: Secondary | ICD-10-CM | POA: Diagnosis not present

## 2019-06-11 DIAGNOSIS — Z20822 Contact with and (suspected) exposure to covid-19: Secondary | ICD-10-CM

## 2019-06-11 NOTE — Patient Instructions (Signed)
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
## 2019-06-13 ENCOUNTER — Telehealth: Payer: Self-pay

## 2019-06-13 ENCOUNTER — Emergency Department (HOSPITAL_COMMUNITY)
Admission: EM | Admit: 2019-06-13 | Discharge: 2019-06-13 | Disposition: A | Discharge status: Home / Self Care | Payer: Medicare Other | Attending: Emergency Medicine | Admitting: Emergency Medicine

## 2019-06-13 ENCOUNTER — Emergency Department (HOSPITAL_COMMUNITY): Payer: Medicare Other

## 2019-06-13 ENCOUNTER — Ambulatory Visit (INDEPENDENT_AMBULATORY_CARE_PROVIDER_SITE_OTHER): Payer: Medicare Other | Admitting: Nurse Practitioner

## 2019-06-13 ENCOUNTER — Telehealth: Payer: Self-pay | Admitting: Nurse Practitioner

## 2019-06-13 ENCOUNTER — Encounter (HOSPITAL_COMMUNITY): Payer: Self-pay | Admitting: Emergency Medicine

## 2019-06-13 ENCOUNTER — Encounter: Payer: Self-pay | Admitting: Nurse Practitioner

## 2019-06-13 ENCOUNTER — Other Ambulatory Visit: Payer: Self-pay

## 2019-06-13 DIAGNOSIS — Z7982 Long term (current) use of aspirin: Secondary | ICD-10-CM | POA: Diagnosis not present

## 2019-06-13 DIAGNOSIS — G441 Vascular headache, not elsewhere classified: Secondary | ICD-10-CM | POA: Insufficient documentation

## 2019-06-13 DIAGNOSIS — Z79899 Other long term (current) drug therapy: Secondary | ICD-10-CM | POA: Insufficient documentation

## 2019-06-13 DIAGNOSIS — I4510 Unspecified right bundle-branch block: Secondary | ICD-10-CM | POA: Diagnosis not present

## 2019-06-13 DIAGNOSIS — I10 Essential (primary) hypertension: Secondary | ICD-10-CM | POA: Insufficient documentation

## 2019-06-13 DIAGNOSIS — Z0000 Encounter for general adult medical examination without abnormal findings: Secondary | ICD-10-CM | POA: Diagnosis not present

## 2019-06-13 DIAGNOSIS — R519 Headache, unspecified: Secondary | ICD-10-CM | POA: Diagnosis not present

## 2019-06-13 LAB — CBC
HCT: 40.4 % (ref 36.0–46.0)
Hemoglobin: 13.2 g/dL (ref 12.0–15.0)
MCH: 30.6 pg (ref 26.0–34.0)
MCHC: 32.7 g/dL (ref 30.0–36.0)
MCV: 93.7 fL (ref 80.0–100.0)
Platelets: 263 10*3/uL (ref 150–400)
RBC: 4.31 MIL/uL (ref 3.87–5.11)
RDW: 11.9 % (ref 11.5–15.5)
WBC: 5.3 10*3/uL (ref 4.0–10.5)
nRBC: 0 % (ref 0.0–0.2)

## 2019-06-13 LAB — COMPREHENSIVE METABOLIC PANEL
ALT: 19 U/L (ref 0–44)
AST: 24 U/L (ref 15–41)
Albumin: 4.3 g/dL (ref 3.5–5.0)
Alkaline Phosphatase: 48 U/L (ref 38–126)
Anion gap: 11 (ref 5–15)
BUN: 11 mg/dL (ref 8–23)
CO2: 25 mmol/L (ref 22–32)
Calcium: 9.4 mg/dL (ref 8.9–10.3)
Chloride: 100 mmol/L (ref 98–111)
Creatinine, Ser: 0.74 mg/dL (ref 0.44–1.00)
GFR calc Af Amer: >60 mL/min (ref >60)
GFR calc non Af Amer: >60 mL/min (ref >60)
Glucose, Bld: 100 mg/dL — ABNORMAL HIGH (ref 70–99)
Potassium: 3.9 mmol/L (ref 3.5–5.1)
Sodium: 136 mmol/L (ref 135–145)
Total Bilirubin: 0.6 mg/dL (ref 0.3–1.2)
Total Protein: 7.1 g/dL (ref 6.5–8.1)

## 2019-06-13 LAB — URINALYSIS, ROUTINE W REFLEX MICROSCOPIC
Bilirubin Urine: NEGATIVE
Glucose, UA: NEGATIVE mg/dL
Hgb urine dipstick: NEGATIVE
Ketones, ur: NEGATIVE mg/dL
Leukocytes,Ua: NEGATIVE
Nitrite: NEGATIVE
Protein, ur: NEGATIVE mg/dL
Specific Gravity, Urine: 1.004 — ABNORMAL LOW (ref 1.005–1.030)
pH: 7 (ref 5.0–8.0)

## 2019-06-13 LAB — TROPONIN I (HIGH SENSITIVITY): Troponin I (High Sensitivity): 5 ng/L (ref <18)

## 2019-06-13 LAB — CBG MONITORING, ED: Glucose-Capillary: 91 mg/dL (ref 70–99)

## 2019-06-13 MED ORDER — LOSARTAN POTASSIUM 50 MG PO TABS
25.0000 mg | ORAL_TABLET | Freq: Once | ORAL | Status: AC
  Administered 2019-06-13: 25 mg via ORAL
  Filled 2019-06-13: qty 1

## 2019-06-13 MED ORDER — LOSARTAN POTASSIUM 25 MG PO TABS: 50.0000 mg | ORAL_TABLET | Freq: Every day | ORAL | Status: DC

## 2019-06-13 MED ORDER — ACETAMINOPHEN 325 MG PO TABS
650.0000 mg | ORAL_TABLET | Freq: Once | ORAL | Status: AC
  Administered 2019-06-13: 650 mg via ORAL
  Filled 2019-06-13: qty 2

## 2019-06-13 NOTE — Progress Notes (Signed)
    This service is provided via telemedicine  No vital signs collected/recorded due to the encounter was a telemedicine visit.   Location of patient (ex: home, work):  Home   Patient consents to a telephone visit:  Yes   Location of the provider (ex: office, home):  [facility], Office   Name of any referring provider:  N/A  Names of all persons participating in the telemedicine service and their role in the encounter:  S.Chrae B/CMA, Jessica Eubanks, NP, and Patient   Time spent on call: 6 min with medical assistant   

## 2019-06-13 NOTE — ED Notes (Signed)
Patient verbalizes understanding of discharge instructions. Opportunity for questioning and answers were provided. Armband removed by staff, pt discharged from ED.  

## 2019-06-13 NOTE — Discharge Instructions (Addendum)
You were seen today for headache, elevated blood pressure, and confusion. Your blood pressure came down with an additional dose of your home losartan. Your headache also improved with your home blood pressure medication and tylenol. Your CT scan did not show evidence of a new stroke or tumor. Your CT scan did show evidence of possible old stroke.   We are increasing your dose of home blood pressure medication, take 2 tablets to equal 50mg  of losartan. You should follow up with your primary doctor for possible medication adjustment to better control your blood pressure and prevent future strokes. It would also be helpful if you had a family member who can help you with your medications.

## 2019-06-13 NOTE — Telephone Encounter (Signed)
You had an AWV with the patient this morning.  Her granddaughter, Lexine Baton, called and said patient's BP is 228/80. States GM has a bad headache and this has been going on for 2 days. Patient gave verbal approval for anyone to speak with the GD [phone]).  Webb Silversmith has 2 appointments today, not sure if she can be seen here today since she had the AWV.  Please advise.
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
## 2019-06-13 NOTE — ED Triage Notes (Signed)
Pt here from home with c/o htn and h/a, family may have missed a dose of her cozaar , pt does live by herself

## 2019-06-13 NOTE — ED Notes (Signed)
Went to collect Trop-pt not in room. RN aware.

## 2019-06-13 NOTE — Telephone Encounter (Signed)
Would recommend her going to the hospital with a blood pressure that high and disorientation. She is at high risk for stroke and MI

## 2019-06-13 NOTE — Progress Notes (Signed)
Subjective:   Caroline Snyder is a 78 y.o. female who presents for Medicare Annual (Subsequent) preventive examination.  Review of Systems:   Cardiac Risk Factors include: advanced age (>53men, >26 women);dyslipidemia;hypertension     Objective:     Vitals: There were no vitals taken for this visit.  There is no height or weight on file to calculate BMI.  Advanced Directives 06/13/2019 06/11/2019 11/16/2018 10/17/2018 07/11/2018 06/09/2018 08/04/2017  Does Patient Have a Medical Advance Directive? No No No No No No No  Does patient want to make changes to medical advance directive? - - - - - - -  Copy of [place] in Chart? - - - - - - -  Would patient like information on creating a medical advance directive? No - Patient declined No - Patient declined - - - Yes (MAU/Ambulatory/Procedural Areas - Information given) -  Pre-existing out of facility DNR order (yellow form or pink MOST form) - - - - - - -    Tobacco Social History   Tobacco Use  Smoking Status Never Smoker  Smokeless Tobacco Never Used     Counseling given: Not Answered   Clinical Intake:  Pre-visit preparation completed: Yes  Pain : 0-10 Pain Score: 6  Pain Type: Acute pain Pain Location: Head Pain Orientation: (forehead) Pain Descriptors / Indicators: Aching Pain Onset: More than a month ago(off and on, hx of headaches) Pain Frequency: Intermittent Pain Relieving Factors: tylenol  Pain Relieving Factors: tylenol  BMI - recorded: 20.08 Nutritional Status: BMI of 19-24  Normal Nutritional Risks: Nausea/ vomitting/ diarrhea Diabetes: No  How often do you need to have someone help you when you read instructions, pamphlets, or other written materials from your doctor or pharmacy?: 3 - Sometimes What is the last grade level you completed in school?: 12th grade  Interpreter Needed?: No     Past Medical History:  Diagnosis Date  . Allergy   . 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 Bunion 07/01/2010  . Depression 05/05/2007  . External hemorrhoids without mention of complication [id number]  . GERD (gastroesophageal reflux disease) 2000  . Hyperlipidemia 06/29/2006  . Memory loss 04/15/2003  . Migraine without aura, without mention of intractable migraine without mention of status migrainosus 03/24/1999  . Osteoporosis, unspecified 04/15/2004  . 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 Unspecified essential hypertension 11/22/2007   Past Surgical History:  Procedure Laterality Date  . COLONOSCOPY  06-16-2007   Internal hemorrhoids,laxity of anal sphincter,polyp at 25cm form anal verge, tortuous sigmoid colon. Dr.Orr   . EYE SURGERY Bilateral 2010   cataract Dr. Bing Plume  . VAGINAL HYSTERECTOMY  1980   Family History  Problem Relation Age of Onset  . Alzheimer's disease Mother   . Diabetes Mother   . Transient ischemic attack Mother   . Heart disease Mother        MI, CHF  . Alcohol abuse Father   . Kidney disease Father   . Hypertension Sister   . Hypertension Brother   . Heart disease Brother   . Hypertension Brother   . Hypertension Sister   . Hypertension Brother   . Other Son        truck accident 07/2016  . Colon cancer Neg Hx   . Stomach cancer Neg Hx    Social History   Socioeconomic History  . Marital status: Widowed    Spouse name: Not on file  . Number of children: 2  . Years  of education: Not on file  . Highest education level: Not on file  Occupational History  . Occupation: Retired  Scientific laboratory technician  . Financial resource strain: Not hard at all  . Food insecurity    Worry: Never true    Inability: Never true  . Transportation needs    Medical: No    Non-medical: No  Tobacco Use  . Smoking status: Never Smoker  . Smokeless tobacco: Never Used  Substance and Sexual Activity  . Alcohol use: No    Alcohol/week: 0.0 standard drinks  . Drug use: No  . Sexual activity: Not Currently    Comment: 1st intercourse- 16,  partners- 2,   Lifestyle  . Physical activity    Days per week: 1 day    Minutes per session: 60 min  . Stress: Not at all  Relationships  . Social connections    Talks on phone: More than three times a week    Gets together: More than three times a week    Attends religious service: More than 4 times per year    Active member of club or organization: No    Attends meetings of clubs or organizations: Never    Relationship status: Widowed  Other Topics Concern  . Not on file  Social History Narrative  . Not on file    Outpatient Encounter Medications as of 06/13/2019  Medication Sig  . Ascorbic Acid (VITAMIN C) 1000 MG tablet Take 1,000 mg by mouth daily.  Marland Kitchen aspirin 81 MG tablet Take 81 mg by mouth daily. Take 1 tablet daily to prevent heart attack, stroke and clotting.  . Calcium-Magnesium-Vitamin D (CALCIUM 1200+D3 PO) Take by mouth daily.  . Cholecalciferol (HM VITAMIN D3) 2000 UNITS CAPS Take 1 tablet daily   . CINNAMON PO 1000 mg po qd  . Cranberry-Vitamin C-Vitamin E (CRANBERRY PLUS VITAMIN C) 4200-20-3 MG-MG-UNIT CAPS Take by mouth daily.  Marland Kitchen losartan (COZAAR) 25 MG tablet Take 25 mg by mouth daily.  . Melatonin 3 MG CAPS Take by mouth as needed.  . Multiple Vitamin (MULTI VITAMIN DAILY PO) Take by mouth. Centrum Silver for Women Take 1 tablet daily  . Multiple Vitamins-Minerals (ICAPS AREDS FORMULA PO) Take by mouth 2 (two) times daily.  . ondansetron (ZOFRAN) 4 MG tablet Take one tablet by mouth every 6-8 hours as needed  . rosuvastatin (CRESTOR) 20 MG tablet Take 1 tablet (20 mg total) by mouth daily.  . traZODone (DESYREL) 50 MG tablet TAKE 1 TABLET BY MOUTH EVERYDAY AT BEDTIME   No facility-administered encounter medications on file as of 06/13/2019.     Activities of Daily Living In your present state of health, do you have any difficulty performing the following activities: 06/13/2019  Hearing? N  Vision? N  Difficulty concentrating or making decisions? Y   Walking or climbing stairs? N  Dressing or bathing? N  Doing errands, shopping? N  Preparing Food and eating ? N  Using the Toilet? N  In the past six months, have you accidently leaked urine? N  Do you have problems with loss of bowel control? N  Managing your Medications? N  Managing your Finances? N  Housekeeping or managing your Housekeeping? N  Some recent data might be hidden    Patient Care Team: [name], NP as PCP - General (Geriatric Medicine) [name], MD as Consulting Physician (Ophthalmology) Calvert Cantor, MD as Consulting Physician (Ophthalmology) Terrance Mass, MD (Inactive) as Consulting Physician (Gynecology)  Assessment:   This is a routine wellness examination for [place].  Exercise Activities and Dietary recommendations Current Exercise Habits: The patient does not participate in regular exercise at present, Exercise limited by: psychological condition(s)  Goals    . Increase water intake     Starting 05/28/16, I will attempt to increase water intake and get at least 5 cups of water per day.     . Self balance     Patient will work on balance in life with activities, exercise, etc       Fall Risk Fall Risk  06/13/2019 04/26/2019 04/06/2019 11/16/2018 10/17/2018  Falls in the past year? 0 0 0 0 0  Number falls in past yr: 0 0 0 0 0  Injury with Fall? 0 0 0 0 0   Is the patient's home free of loose throw rugs in walkways, pet beds, electrical cords, etc?   no      Grab bars in the bathroom? no      Handrails on the stairs?   no      Adequate lighting?   yes  Timed Get Up and Go performed: na  Depression Screen PHQ 2/9 Scores 06/13/2019 07/18/2018 06/09/2018 06/02/2017  PHQ - 2 Score 0 4 0 1  PHQ- 9 Score - 19 - -  Exception Documentation - - - -  Not completed - - - -     Cognitive Function MMSE - Mini Mental State Exam 06/09/2018 06/02/2017 05/28/2016  Orientation to time 5 4 5   Orientation to Place 5 5 5   Registration 3  3 3   Attention/ Calculation 5 5 5   Recall 0 0 2  Language- name 2 objects 2 2 2   Language- repeat 1 1 1   Language- follow 3 step command 3 3 3   Language- read & follow direction 1 1 1   Write a sentence 1 1 1   Copy design 1 1 1   Total score 27 26 29      6CIT Screen 06/13/2019  What Year? 0 points  What month? 0 points  What time? 0 points  Count back from 20 0 points  Months in reverse 2 points  Repeat phrase 4 points  Total Score 6    Immunization History  Administered Date(s) Administered  . Fluad Quad(high Dose 65+) 04/26/2019  . Influenza, High Dose Seasonal PF 03/11/2017, 06/09/2018  . Influenza,inj,Quad PF,6+ Mos 07/19/2013, 05/28/2016, 06/02/2017  . Influenza-Unspecified 05/23/2015  . PPD Test 11/30/2013, 05/30/2015  . Pneumococcal Conjugate-13 05/28/2016  . Pneumococcal Polysaccharide-23 06/09/2018  . Tdap 11/17/2018  . Zoster 11/19/2009    Qualifies for Shingles Vaccine?yes   Screening Tests Health Maintenance  Topic Date Due  . MAMMOGRAM  06/09/2018  . COLONOSCOPY  05/13/2022  . TETANUS/TDAP  11/16/2028  . INFLUENZA VACCINE  Completed  . DEXA SCAN  Completed  . PNA vac Low Risk Adult  Completed    Cancer Screenings: Lung: Low Dose CT Chest recommended if Age 46-80 years, 30 pack-year currently smoking OR have quit w/in 15years. Patient does not qualify. Breast:  Up to date on Mammogram? No  appt scheduled Up to date of Bone Density/Dexa? Yes Colorectal: up to date, 2023  Additional Screenings:  Hepatitis C Screening: completed      Plan:     I have personally reviewed and noted the following in the patient's chart:   . Medical and social history . Use of alcohol, tobacco or illicit drugs  . Current medications and supplements . Functional ability and  status . Nutritional status . Physical activity . Advanced directives . List of other physicians . Hospitalizations, surgeries, and ER visits in previous 12 months . Vitals . Screenings to  include cognitive, depression, and falls . Referrals and appointments  In addition, I have reviewed and discussed with patient certain preventive protocols, quality metrics, and best practice recommendations. A written personalized care plan for preventive services as well as general preventive health recommendations were provided to patient.     [name], NP  06/13/2019

## 2019-06-13 NOTE — Telephone Encounter (Signed)
Per Jessica's message for the pt to be taken to ED bc of her risk of stroke & MI. This message was shared with Nikki(DIL). Thanks, [name]

## 2019-06-13 NOTE — Patient Instructions (Signed)
Caroline Snyder , Thank you for taking time to come for your Medicare Wellness Visit. I appreciate your ongoing commitment to your health goals. Please review the following plan we discussed and let me know if I can assist you in the future.   Screening recommendations/referrals: Colonoscopy up to date Mammogram you have a mammogram scheduled in December  Bone Density up to date Recommended yearly ophthalmology/optometry visit for glaucoma screening and checkup Recommended yearly dental visit for hygiene and checkup  Vaccinations: Influenza vaccine up to date Pneumococcal vaccine up to date Tdap vaccine up to date Shingles vaccine NEEDED, shingrix- to get at your local pharmacy   Advanced directives: recommended to complete.  Conditions/risks identified:cardiovascular risk for heart attack/stroke for elevated blood pressure Worsening memory loss.   Next appointment: 1 year.    Preventive Care 79 Years and Older, Female Preventive care refers to lifestyle choices and visits with your health care provider that can promote health and wellness. What does preventive care include?  A yearly physical exam. This is also called an annual well check.  Dental exams once or twice a year.  Routine eye exams. Ask your health care provider how often you should have your eyes checked.  Personal lifestyle choices, including:  Daily care of your teeth and gums.  Regular physical activity.  Eating a healthy diet.  Avoiding tobacco and drug use.  Limiting alcohol use.  Practicing safe sex.  Taking low-dose aspirin every day.  Taking vitamin and mineral supplements as recommended by your health care provider. What happens during an annual well check? The services and screenings done by your health care provider during your annual well check will depend on your age, overall health, lifestyle risk factors, and family history of disease. Counseling  Your health care provider may ask you  questions about your:  Alcohol use.  Tobacco use.  Drug use.  Emotional well-being.  Home and relationship well-being.  Sexual activity.  Eating habits.  History of falls.  Memory and ability to understand (cognition).  Work and work Statistician.  Reproductive health. Screening  You may have the following tests or measurements:  Height, weight, and BMI.  Blood pressure.  Lipid and cholesterol levels. These may be checked every 5 years, or more frequently if you are over 69 years old.  Skin check.  Lung cancer screening. You may have this screening every year starting at age 24 if you have a 30-pack-year history of smoking and currently smoke or have quit within the past 15 years.  Fecal occult blood test (FOBT) of the stool. You may have this test every year starting at age 25.  Flexible sigmoidoscopy or colonoscopy. You may have a sigmoidoscopy every 5 years or a colonoscopy every 10 years starting at age 65.  Hepatitis C blood test.  Hepatitis B blood test.  Sexually transmitted disease (STD) testing.  Diabetes screening. This is done by checking your blood sugar (glucose) after you have not eaten for a while (fasting). You may have this done every 1-3 years.  Bone density scan. This is done to screen for osteoporosis. You may have this done starting at age 69.  Mammogram. This may be done every 1-2 years. Talk to your health care provider about how often you should have regular mammograms. Talk with your health care provider about your test results, treatment options, and if necessary, the need for more tests. Vaccines  Your health care provider may recommend certain vaccines, such as:  Influenza vaccine. This is  recommended every year.  Tetanus, diphtheria, and acellular pertussis (Tdap, Td) vaccine. You may need a Td booster every 10 years.  Zoster vaccine. You may need this after age 42.  Pneumococcal 13-valent conjugate (PCV13) vaccine. One dose is  recommended after age 42.  Pneumococcal polysaccharide (PPSV23) vaccine. One dose is recommended after age 57. Talk to your health care provider about which screenings and vaccines you need and how often you need them. This information is not intended to replace advice given to you by your health care provider. Make sure you discuss any questions you have with your health care provider. Document Released: 08/15/2015 Document Revised: 04/07/2016 Document Reviewed: 05/20/2015 Elsevier Interactive Patient Education  2017 [place] Prevention in the Home Falls can cause injuries. They can happen to people of all ages. There are many things you can do to make your home safe and to help prevent falls. What can I do on the outside of my home?  Regularly fix the edges of walkways and driveways and fix any cracks.  Remove anything that might make you trip as you walk through a door, such as a raised step or threshold.  Trim any bushes or trees on the path to your home.  Use bright outdoor lighting.  Clear any walking paths of anything that might make someone trip, such as rocks or tools.  Regularly check to see if handrails are loose or broken. Make sure that both sides of any steps have handrails.  Any raised decks and porches should have guardrails on the edges.  Have any leaves, snow, or ice cleared regularly.  Use sand or salt on walking paths during winter.  Clean up any spills in your garage right away. This includes oil or grease spills. What can I do in the bathroom?  Use night lights.  Install grab bars by the toilet and in the tub and shower. Do not use towel bars as grab bars.  Use non-skid mats or decals in the tub or shower.  If you need to sit down in the shower, use a plastic, non-slip stool.  Keep the floor dry. Clean up any water that spills on the floor as soon as it happens.  Remove soap buildup in the tub or shower regularly.  Attach bath mats  securely with double-sided non-slip rug tape.  Do not have throw rugs and other things on the floor that can make you trip. What can I do in the bedroom?  Use night lights.  Make sure that you have a light by your bed that is easy to reach.  Do not use any sheets or blankets that are too big for your bed. They should not hang down onto the floor.  Have a firm chair that has side arms. You can use this for support while you get dressed.  Do not have throw rugs and other things on the floor that can make you trip. What can I do in the kitchen?  Clean up any spills right away.  Avoid walking on wet floors.  Keep items that you use a lot in easy-to-reach places.  If you need to reach something above you, use a strong step stool that has a grab bar.  Keep electrical cords out of the way.  Do not use floor polish or wax that makes floors slippery. If you must use wax, use non-skid floor wax.  Do not have throw rugs and other things on the floor that can make you trip.  What can I do with my stairs?  Do not leave any items on the stairs.  Make sure that there are handrails on both sides of the stairs and use them. Fix handrails that are broken or loose. Make sure that handrails are as long as the stairways.  Check any carpeting to make sure that it is firmly attached to the stairs. Fix any carpet that is loose or worn.  Avoid having throw rugs at the top or bottom of the stairs. If you do have throw rugs, attach them to the floor with carpet tape.  Make sure that you have a light switch at the top of the stairs and the bottom of the stairs. If you do not have them, ask someone to add them for you. What else can I do to help prevent falls?  Wear shoes that:  Do not have high heels.  Have rubber bottoms.  Are comfortable and fit you well.  Are closed at the toe. Do not wear sandals.  If you use a stepladder:  Make sure that it is fully opened. Do not climb a closed  stepladder.  Make sure that both sides of the stepladder are locked into place.  Ask someone to hold it for you, if possible.  Clearly mark and make sure that you can see:  Any grab bars or handrails.  First and last steps.  Where the edge of each step is.  Use tools that help you move around (mobility aids) if they are needed. These include:  Canes.  Walkers.  Scooters.  Crutches.  Turn on the lights when you go into a dark area. Replace any light bulbs as soon as they burn out.  Set up your furniture so you have a clear path. Avoid moving your furniture around.  If any of your floors are uneven, fix them.  If there are any pets around you, be aware of where they are.  Review your medicines with your doctor. Some medicines can make you feel dizzy. This can increase your chance of falling. Ask your doctor what other things that you can do to help prevent falls. This information is not intended to replace advice given to you by your health care provider. Make sure you discuss any questions you have with your health care provider. Document Released: 05/15/2009 Document Revised: 12/25/2015 Document Reviewed: 08/23/2014 Elsevier Interactive Patient Education  2017 [employer].

## 2019-06-13 NOTE — ED Notes (Signed)
EDP at bedside, discussing family's concern that pt seems confused.

## 2019-06-13 NOTE — Telephone Encounter (Signed)
pts DIL is calling back bc Caroline Snyder's BP seems to be increasing even after taking her meds (237/92).  She had COVID testing in Monday but results aren't back yet. Head hurts & she's disoriented.  Please advise [name]

## 2019-06-14 ENCOUNTER — Ambulatory Visit (INDEPENDENT_AMBULATORY_CARE_PROVIDER_SITE_OTHER): Payer: Medicare Other | Admitting: Family

## 2019-06-14 ENCOUNTER — Other Ambulatory Visit: Payer: Self-pay

## 2019-06-14 ENCOUNTER — Encounter: Payer: Self-pay | Admitting: Family

## 2019-06-14 VITALS — BP 158/84 | HR 69 | Temp 97.5°F | Ht 60.0 in | Wt 104.2 lb

## 2019-06-14 DIAGNOSIS — I10 Essential (primary) hypertension: Secondary | ICD-10-CM

## 2019-06-14 DIAGNOSIS — R519 Headache, unspecified: Secondary | ICD-10-CM | POA: Diagnosis not present

## 2019-06-14 DIAGNOSIS — R110 Nausea: Secondary | ICD-10-CM

## 2019-06-14 MED ORDER — CLONIDINE HCL 0.1 MG PO TABS
0.1000 mg | ORAL_TABLET | ORAL | Status: AC
  Administered 2019-06-14: 0.1 mg via ORAL

## 2019-06-14 NOTE — Progress Notes (Signed)
Provider: Artisha Capri FNP-C  [name], NP  Patient Care Team: [name], NP as PCP - General (Geriatric Medicine) [name], MD as Consulting Physician (Ophthalmology) Calvert Cantor, MD as Consulting Physician (Ophthalmology) Terrance Mass, MD (Inactive) as Consulting Physician (Gynecology)  Extended Emergency Contact Information Primary Emergency Contact: Carma Lair of [place] Phone: [phone] Relation: Son Secondary Emergency Contact: [place] Phone: [phone] Relation: Other  Code Status:  DNR Goals of care: Advanced Directive information Advanced Directives 06/14/2019  Does Patient Have a Medical Advance Directive? No  Does patient want to make changes to medical advance directive? -  Copy of [place] in Chart? -  Would patient like information on creating a medical advance directive? No - Patient declined  Pre-existing out of facility DNR order (yellow form or pink MOST form) -     Chief Complaint  Patient presents with   Acute Visit    Follow up on Blood pressure recently in ER patient states her head is still hurting, eye pain, and blood pressure has been high for 2 days now    Medication Management    patient states she has been using tyelnol for pain     HPI:  Pt is a 78 y.o. female seen today for an acute visit for follow up ED visit for high blood pressure.she is here with her grand daughter.she states her blood pressure has been high for the past two days.Readings in the 160's/80's- 200's/90's.Her Losartan was increased from 25 mg tablet to 50 mg tablet daily in the ED.she states went to visit her sister few days ago and might have forgotten to take her blood pressure medication.she had CBC,CMP,Troponin done  in the ED was unremarkable except glucose was 100.Urine analysis was negative for urinary tract infection.she had a CT scan of Head which showed no acute intracranial  abnormality.Had age related atrophy and chronic small vessel ischemia and 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     she complains of  headache on her forehead with pain in her eyes.Headache is described as pounding.Also having some nausea.she was able to eat breakfast today without any vomiting.[place] daughter states whenever she lies down and changes position symptoms get worst.Also states patient has been stressed out lately having to work five times per week.  She denies any recent contact with person with COVID-19 though states two weeks ago was dancing with someone who tested positive for COVID-19 though grand daughter state the person was positive later.  Blood pressure checked lying 156/80; sitting 138/78; 168/84 standing.      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 Dr.Orr    EYE SURGERY Bilateral 2010   cataract Dr. Bing Plume   VAGINAL HYSTERECTOMY  1980    Allergies  Allergen Reactions   Vioxx [Rofecoxib] Nausea Only    Pt. Can't remember  what reaction was but asks to leave on her profile.     Outpatient Encounter Medications as of 06/14/2019  Medication Sig   Ascorbic Acid (VITAMIN C) 1000 MG tablet Take 1,000 mg by mouth daily.   aspirin 81 MG tablet Take  81 mg by mouth daily. Take 1 tablet daily to prevent heart attack, stroke and clotting.   Calcium-Magnesium-Vitamin D (CALCIUM 1200+D3 PO) Take by mouth daily.   Cholecalciferol (HM VITAMIN D3) 2000 UNITS CAPS Take 1 tablet daily    CINNAMON PO 1000 mg po qd   Cranberry-Vitamin C-Vitamin E (CRANBERRY PLUS VITAMIN C) 4200-20-3 MG-MG-UNIT CAPS Take by mouth daily.   losartan (COZAAR) 25 MG tablet Take 2 tablets (50 mg total) by mouth daily.   Melatonin 3 MG CAPS Take by mouth as needed.   Multiple Vitamin (MULTI VITAMIN DAILY PO) Take by mouth. Centrum Silver for Women Take 1 tablet daily   Multiple Vitamins-Minerals (ICAPS AREDS FORMULA PO) Take by mouth 2 (two) times daily.   ondansetron (ZOFRAN) 4 MG tablet Take one tablet by mouth every 6-8 hours as needed   rosuvastatin (CRESTOR) 20 MG tablet Take 1 tablet (20 mg total) by mouth daily.   traZODone (DESYREL) 50 MG tablet TAKE 1 TABLET BY MOUTH EVERYDAY AT BEDTIME   No facility-administered encounter medications on file as of 06/14/2019.     Review of Systems  Constitutional: Negative for appetite change, chills, fatigue and fever.  HENT: Negative for congestion, hearing loss, rhinorrhea, sinus pressure, sinus pain, sneezing and sore throat.   Eyes: Negative for discharge, redness, itching and visual disturbance.       Pain due to headache   Respiratory: Negative for cough, chest tightness, shortness of breath and wheezing.   Cardiovascular: Negative for chest pain, palpitations and leg swelling.  Gastrointestinal: Positive for nausea. Negative for abdominal distention, abdominal pain, constipation, diarrhea and vomiting.  Genitourinary: Negative for difficulty urinating, dysuria, flank pain, frequency and urgency.  Musculoskeletal: Negative for arthralgias and gait problem.  Skin: Negative for color change, pallor and rash.  Neurological: Positive for headaches. Negative for dizziness, seizures, syncope, speech difficulty,  weakness, light-headedness and numbness.  Hematological: Does not bruise/bleed easily.  Psychiatric/Behavioral: Negative for agitation, confusion and sleep disturbance. The patient is not nervous/anxious.     Immunization History  Administered Date(s) Administered   Fluad Quad(high Dose 65+) 04/26/2019   Influenza, High Dose Seasonal PF 03/11/2017, 06/09/2018   Influenza,inj,Quad PF,6+ Mos 07/19/2013, 05/28/2016, 06/02/2017   Influenza-Unspecified 05/23/2015   PPD Test 11/30/2013, 05/30/2015   Pneumococcal Conjugate-13 05/28/2016   Pneumococcal Polysaccharide-23 06/09/2018   Tdap 11/17/2018   Zoster 11/19/2009   Pertinent  Health Maintenance Due  Topic Date Due   MAMMOGRAM  06/09/2018   COLONOSCOPY  05/13/2022   INFLUENZA VACCINE  Completed   DEXA SCAN  Completed   PNA vac Low Risk Adult  Completed   Fall Risk  06/14/2019 06/13/2019 04/26/2019 04/06/2019 11/16/2018  Falls in the past year? 0 0 0 0 0  Number falls in past yr: 0 0 0 0 0  Injury with Fall? 0 0 0 0 0      Vitals:   06/14/19 1317  BP: (!) 142/80  Pulse: 69  Temp: (!) 97.5 F (36.4 C)  TempSrc: Temporal  SpO2: 97%  Weight: 104 lb 3.2 oz (47.3 kg)  Height: 5' (1.524 m)   Body mass index is 20.35 kg/m. Physical Exam Constitutional:      General: She is not in acute  distress.    Appearance: She is normal weight. She is not ill-appearing.  HENT:     Head: Normocephalic.     Right Ear: Tympanic membrane, ear canal and external ear normal. There is no impacted cerumen.     Left Ear: Tympanic membrane, ear canal and external ear normal. There is no impacted cerumen.     Nose: Nose normal. No congestion or rhinorrhea.     Mouth/Throat:     Mouth: Mucous membranes are moist.     Pharynx: Oropharynx is clear. No oropharyngeal exudate or posterior oropharyngeal erythema.  Eyes:     General: No scleral icterus.       Right eye: No discharge.        Left eye: No discharge.     Extraocular  Movements: Extraocular movements intact.     Conjunctiva/sclera: Conjunctivae normal.     Pupils: Pupils are equal, round, and reactive to light.  Neck:     Musculoskeletal: Normal range of motion. No neck rigidity or muscular tenderness.     Vascular: No carotid bruit.  Cardiovascular:     Rate and Rhythm: Normal rate and regular rhythm.     Pulses: Normal pulses.     Heart sounds: Normal heart sounds. No murmur. No friction rub. No gallop.   Pulmonary:     Effort: Pulmonary effort is normal. No respiratory distress.     Breath sounds: Normal breath sounds. No wheezing, rhonchi or rales.  Chest:     Chest wall: No tenderness.  Abdominal:     General: Bowel sounds are normal. There is no distension.     Palpations: Abdomen is soft. There is no mass.     Tenderness: There is no abdominal tenderness. There is no right CVA tenderness, left CVA tenderness, guarding or rebound.  Musculoskeletal: Normal range of motion.        General: No swelling or tenderness.     Right lower leg: No edema.     Left lower leg: No edema.  Lymphadenopathy:     Cervical: No cervical adenopathy.  Skin:    General: Skin is warm and dry.     Coloration: Skin is not pale.     Findings: No bruising, erythema or rash.  Neurological:     Mental Status: She is alert and oriented to person, place, and time.     Cranial Nerves: No cranial nerve deficit.     Sensory: No sensory deficit.     Motor: No weakness.     Coordination: Coordination normal.     Gait: Gait normal.  Psychiatric:        Mood and Affect: Mood normal.        Behavior: Behavior normal.        Thought Content: Thought content normal.        Judgment: Judgment normal.     Labs reviewed: Recent Labs    12/14/18 0906 04/18/19 1057 06/13/19 1500  NA 141 139 136  K 4.4 4.2 3.9  CL 107 101 100  CO2 29 30 25   GLUCOSE 102* 75 100*  BUN 17 17 11   CREATININE 0.90 0.80 0.74  CALCIUM 9.6 10.4 9.4   Recent Labs    10/17/18 0902  12/14/18 0906 06/13/19 1500  AST 24 20 24   ALT 15 14 19   ALKPHOS  --   --  48  BILITOT 0.4 0.4 0.6  PROT 6.5 6.7 7.1  ALBUMIN  --   --  4.3  Recent Labs    07/11/18 1616 10/17/18 0902 04/18/19 1057 06/13/19 1500  WBC 5.6 4.8 4.4 5.3  NEUTROABS 3,674 3,000 2.5  --   HGB 12.6 13.1 13.8 13.2  HCT 37.5 38.1 41.1 40.4  MCV 91.0 89.9 91.1 93.7  PLT 286 261 296.0 263   Lab Results  Component Value Date   TSH 2.05 03/11/2017   No results found for: HGBA1C Lab Results  Component Value Date   CHOL 224 (H) 04/26/2019   HDL 66 04/26/2019   LDLCALC 137 (H) 04/26/2019   TRIG 104 04/26/2019   CHOLHDL 3.4 04/26/2019    Significant Diagnostic Results in last 30 days:  Ct Head Wo Contrast  Result Date: 06/13/2019 CLINICAL DATA:  Headache EXAM: CT HEAD WITHOUT CONTRAST TECHNIQUE: Contiguous axial images were obtained from the base of the skull through the vertex without intravenous contrast. COMPARISON:  None. FINDINGS: Brain: No evidence of acute territorial infarction, hemorrhage, hydrocephalus,extra-axial collection or mass lesion/mass effect. There is dilatation the ventricles and sulci consistent with age-related atrophy. Low-attenuation changes in the deep white matter consistent with small vessel ischemia. 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 Vascular: No hyperdense vessel or unexpected calcification. Skull: The skull is intact. No fracture or focal lesion identified. Sinuses/Orbits: The visualized paranasal sinuses and mastoid air cells are clear. The orbits and globes intact. Other: None IMPRESSION: No acute intracranial abnormality. 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 Electronically Signed   By: Prudencio Pair M.D.   On: 06/13/2019 20:38    Assessment/Plan 1. 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 Will continue on Losartan 50 mg tablet daily.she will follow up in one week for blood pressure check.    2. Nonintractable episodic headache, unspecified headache type CT scan done in ED 06/13/2019 was negative for acute abnormalities.Suspect possible due to her high blood pressure and increased stress level.encouraged to take extra strength Tylenol 500 mg tablet one by ,mouth every 8 hours as needed for headache.Stress management advised.   3. Nausea without vomiting Continue on Zofran 4 mg tablet one every 8 hours as needed.   Family/ staff Communication: Reviewed plan of care with patient.  Labs/tests ordered: None   Laquilla Dault C Rashawn Rolon, NP

## 2019-06-15 LAB — NOVEL CORONAVIRUS, NAA: SARS-CoV-2, NAA: NOT DETECTED

## 2019-06-26 ENCOUNTER — Other Ambulatory Visit: Payer: Self-pay

## 2019-06-26 ENCOUNTER — Ambulatory Visit (INDEPENDENT_AMBULATORY_CARE_PROVIDER_SITE_OTHER): Payer: Medicare Other | Admitting: Family

## 2019-06-26 ENCOUNTER — Encounter: Payer: Self-pay | Admitting: Family

## 2019-06-26 VITALS — BP 161/70

## 2019-06-26 DIAGNOSIS — I10 Essential (primary) hypertension: Secondary | ICD-10-CM

## 2019-06-26 DIAGNOSIS — R519 Headache, unspecified: Secondary | ICD-10-CM | POA: Diagnosis not present

## 2019-06-26 MED ORDER — LOSARTAN POTASSIUM 50 MG PO TABS: 50.0000 mg | ORAL_TABLET | Freq: Two times a day (BID) | ORAL | 1 refills | Status: DC

## 2019-06-26 NOTE — Progress Notes (Signed)
This service is provided via telemedicine  No vital signs collected/recorded due to the encounter was a telemedicine visit.   Location of patient (ex: home, work):  Home  Patient consents to a telephone visit:  Yes  Location of the provider (ex: office, home):  Office   Name of any referring provider:  Sherrie Mustache, NP  Names of all persons participating in the telemedicine service and their role in the encounter:  Dinah [name] NP, Ruthell Rummage CMA, and patient   Time spent on call:  Ruthell Rummage CMA, spent 13 minutes on phone with patient   Provider: Dinah [name] FNP-C  [name], NP  Patient Care Team: [name], NP as PCP - General (Geriatric Medicine) [name], MD as Consulting Physician (Ophthalmology) Calvert Cantor, MD as Consulting Physician (Ophthalmology) Terrance Mass, MD (Inactive) as Consulting Physician (Gynecology)  Extended Emergency Contact Information Primary Emergency Contact: Carma Lair of [place] Phone: [phone] Relation: Son Secondary Emergency Contact: [place] Phone: [phone] Relation: Other  Code Status:  Full Code  Goals of care: Advanced Directive information Advanced Directives 06/14/2019  Does Patient Have a Medical Advance Directive? No  Does patient want to make changes to medical advance directive? -  Copy of [place] in Chart? -  Would patient like information on creating a medical advance directive? No - Patient declined  Pre-existing out of facility DNR order (yellow form or pink MOST form) -     Chief Complaint  Patient presents with  . Follow-up    Follow up on Blood Pressure, patient states she is feeling "ok "Patient reports gets headaches but not as bad and reports nausea has resolved      HPI:  Pt is a 78 y.o. female seen today for an acute visit for follow up high blood pressure.she states still having some headache sometimes  but has improved compared to previous visit.Nausea has resolved.she checks blood pressure twice daily readings ranging in the 148/70 -168/79.States most SBP are above 150's.she states just woke up checked her blood pressure was 201/85 but got startled she rechecked blood pressure was 161/70.she denies any dizziness,palpitation,chest pain or shortness of breath.she is currently taking losartan 25 mg tablet 2 tablets (50 mg ) by mouth daily.    Past Medical History:  Diagnosis Date  . Allergy   . 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 Bunion 07/01/2010  . Depression 05/05/2007  . External hemorrhoids without mention of complication [id number]  . GERD (gastroesophageal reflux disease) 2000  . Hyperlipidemia 06/29/2006  . Memory loss 04/15/2003  . Migraine without aura, without mention of intractable migraine without mention of status migrainosus 03/24/1999  . Osteoporosis, unspecified 04/15/2004  . 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 Unspecified essential hypertension 11/22/2007   Past Surgical History:  Procedure Laterality Date  . COLONOSCOPY  06-16-2007   Internal hemorrhoids,laxity of anal sphincter,polyp at 25cm form anal verge, tortuous sigmoid colon. Dr.Orr   . EYE SURGERY Bilateral 2010   cataract Dr. Bing Plume  . VAGINAL HYSTERECTOMY  1980    Allergies  Allergen Reactions  . Vioxx [Rofecoxib] Nausea Only    Pt. Can't remember what reaction was but asks to leave on her profile.     Outpatient Encounter Medications as of 06/26/2019  Medication Sig  . Ascorbic Acid (VITAMIN C) 1000 MG tablet Take 1,000 mg by mouth daily.  Marland Kitchen aspirin 81 MG tablet Take 81 mg by mouth daily. Take 1 tablet daily  to prevent heart attack, stroke and clotting.  . Calcium-Magnesium-Vitamin D (CALCIUM 1200+D3 PO) Take by mouth daily.  . Cholecalciferol (HM VITAMIN D3) 2000 UNITS CAPS Take 1 tablet daily   . CINNAMON PO 1000 mg po qd  . Cranberry-Vitamin C-Vitamin E (CRANBERRY PLUS VITAMIN  C) 4200-20-3 MG-MG-UNIT CAPS Take 1 tablet by mouth 2 (two) times daily.   Marland Kitchen losartan (COZAAR) 50 MG tablet Take 1 tablet (50 mg total) by mouth 2 (two) times daily.  . Melatonin 3 MG CAPS Take by mouth as needed.  . Multiple Vitamin (MULTI VITAMIN DAILY PO) Take by mouth. Centrum Silver for Women Take 1 tablet daily  . Multiple Vitamins-Minerals (ICAPS AREDS FORMULA PO) Take by mouth 2 (two) times daily.  . ondansetron (ZOFRAN) 4 MG tablet Take one tablet by mouth every 6-8 hours as needed  . rosuvastatin (CRESTOR) 20 MG tablet Take 1 tablet (20 mg total) by mouth daily.  . traZODone (DESYREL) 50 MG tablet TAKE 1 TABLET BY MOUTH EVERYDAY AT BEDTIME  . [DISCONTINUED] losartan (COZAAR) 25 MG tablet Take 2 tablets (50 mg total) by mouth daily.   No facility-administered encounter medications on file as of 06/26/2019.     Review of Systems  Constitutional: Negative for appetite change, chills, fatigue and fever.  HENT: Negative for congestion, rhinorrhea, sinus pressure, sinus pain, sneezing and sore throat.   Eyes: Negative for discharge, redness, itching and visual disturbance.  Respiratory: Negative for cough, chest tightness, shortness of breath and wheezing.   Cardiovascular: Negative for chest pain, palpitations and leg swelling.  Gastrointestinal: Negative for abdominal distention, abdominal pain, constipation, diarrhea, nausea and vomiting.  Genitourinary: Negative for difficulty urinating, dysuria, flank pain, frequency and urgency.  Musculoskeletal: Negative for arthralgias and back pain.  Skin: Negative for color change, pallor and rash.  Neurological: Negative for dizziness, weakness, light-headedness and numbness.       Headache sometimes.  Psychiatric/Behavioral: Negative for agitation. The patient is not nervous/anxious.        Trazodone effective     Immunization History  Administered Date(s) Administered  . Fluad Quad(high Dose 65+) 04/26/2019  . Influenza, High Dose  Seasonal PF 03/11/2017, 06/09/2018  . Influenza,inj,Quad PF,6+ Mos 07/19/2013, 05/28/2016, 06/02/2017  . Influenza-Unspecified 05/23/2015  . PPD Test 11/30/2013, 05/30/2015  . Pneumococcal Conjugate-13 05/28/2016  . Pneumococcal Polysaccharide-23 06/09/2018  . Tdap 11/17/2018  . Zoster 11/19/2009   Pertinent  Health Maintenance Due  Topic Date Due  . MAMMOGRAM  06/09/2018  . COLONOSCOPY  05/13/2022  . INFLUENZA VACCINE  Completed  . DEXA SCAN  Completed  . PNA vac Low Risk Adult  Completed   Fall Risk  06/26/2019 06/14/2019 06/13/2019 04/26/2019 04/06/2019  Falls in the past year? 0 0 0 0 0  Number falls in past yr: 0 0 0 0 0  Injury with Fall? 0 0 0 0 0    Vitals:   06/26/19 0824  BP: (!) 161/70   There is no height or weight on file to calculate BMI. Physical Exam  Labs reviewed: Recent Labs    12/14/18 0906 04/18/19 1057 06/13/19 1500  NA 141 139 136  K 4.4 4.2 3.9  CL 107 101 100  CO2 29 30 25   GLUCOSE 102* 75 100*  BUN 17 17 11   CREATININE 0.90 0.80 0.74  CALCIUM 9.6 10.4 9.4   Recent Labs    10/17/18 0902 12/14/18 0906 06/13/19 1500  AST 24 20 24   ALT 15 14 19   ALKPHOS  --   --  48  BILITOT 0.4 0.4 0.6  PROT 6.5 6.7 7.1  ALBUMIN  --   --  4.3   Recent Labs    07/11/18 1616 10/17/18 0902 04/18/19 1057 06/13/19 1500  WBC 5.6 4.8 4.4 5.3  NEUTROABS 3,674 3,000 2.5  --   HGB 12.6 13.1 13.8 13.2  HCT 37.5 38.1 41.1 40.4  MCV 91.0 89.9 91.1 93.7  PLT 286 261 296.0 263   Lab Results  Component Value Date   TSH 2.05 03/11/2017   No results found for: HGBA1C Lab Results  Component Value Date   CHOL 224 (H) 04/26/2019   HDL 66 04/26/2019   LDLCALC 137 (H) 04/26/2019   TRIG 104 04/26/2019   CHOLHDL 3.4 04/26/2019    Significant Diagnostic Results in last 30 days:  Ct Head Wo Contrast  Result Date: 06/13/2019 CLINICAL DATA:  Headache EXAM: CT HEAD WITHOUT CONTRAST TECHNIQUE: Contiguous axial images were obtained from the base of the skull  through the vertex without intravenous contrast. COMPARISON:  None. FINDINGS: Brain: No evidence of acute territorial infarction, hemorrhage, hydrocephalus,extra-axial collection or mass lesion/mass effect. There is dilatation the ventricles and sulci consistent with age-related atrophy. Low-attenuation changes in the deep white matter consistent with small vessel ischemia. 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 Vascular: No hyperdense vessel or unexpected calcification. Skull: The skull is intact. No fracture or focal lesion identified. Sinuses/Orbits: The visualized paranasal sinuses and mastoid air cells are clear. The orbits and globes intact. Other: None IMPRESSION: No acute intracranial abnormality. 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 Electronically Signed   By: Prudencio Pair M.D.   On: 06/13/2019 20:38    Assessment/Plan 1. Essential hypertension SBP remains high with occasional headaches.Previous nausea has resolved.Reports no chest pain,palpitation or shortness of breath.will change her Losartan from 50 mg daily to a total of 100 mg daily.will have take 50 mg tablet in the morning and 50 in the evening for a better B/p control.  - losartan (COZAAR) 50 MG tablet; Take 1 tablet (50 mg total) by mouth 2 (two) times daily.  Dispense: 60 tablet; Refill: 1  2. Nonintractable episodic headache, unspecified headache type Suspect due to her high blood pressure.change losartan as above.continue with Tylenol as needed for headaches.   Family/ staff Communication: Reviewed plan of care with patient.  Labs/tests ordered: None   Next appointment: 4 weeks for B/p check.   Spent 37minutes of non-face to face with patient    Sandrea Hughs, NP

## 2019-06-26 NOTE — Patient Instructions (Signed)

## 2019-07-19 ENCOUNTER — Ambulatory Visit
Admission: RE | Admit: 2019-07-19 | Discharge: 2019-07-19 | Disposition: A | Discharge status: Home / Self Care | Payer: Medicare Other | Source: Ambulatory Visit | Attending: Family | Admitting: Family

## 2019-07-19 ENCOUNTER — Other Ambulatory Visit: Payer: Self-pay

## 2019-07-19 DIAGNOSIS — Z1231 Encounter for screening mammogram for malignant neoplasm of breast: Secondary | ICD-10-CM

## 2019-07-22 ENCOUNTER — Other Ambulatory Visit: Payer: Self-pay | Admitting: Family

## 2019-07-23 ENCOUNTER — Other Ambulatory Visit: Payer: Self-pay | Admitting: Family

## 2019-07-23 DIAGNOSIS — I10 Essential (primary) hypertension: Secondary | ICD-10-CM

## 2019-07-23 NOTE — Telephone Encounter (Signed)
Per pharmacy, patient is having to pay $60 OOP for rosuvastatin.  Patient requesting a lower cost medication.  Pharmacy states atorvastatin is $45 OOP. Please advise.

## 2019-07-25 ENCOUNTER — Ambulatory Visit: Payer: Medicare Other | Admitting: Family

## 2019-07-31 ENCOUNTER — Other Ambulatory Visit: Payer: Self-pay

## 2019-07-31 ENCOUNTER — Other Ambulatory Visit: Payer: Medicare Other

## 2019-07-31 DIAGNOSIS — E782 Mixed hyperlipidemia: Secondary | ICD-10-CM | POA: Diagnosis not present

## 2019-07-31 DIAGNOSIS — I10 Essential (primary) hypertension: Secondary | ICD-10-CM

## 2019-08-01 LAB — LIPID PANEL
Cholesterol: 197 mg/dL (ref <200)
HDL: 58 mg/dL (ref >=50)
LDL Cholesterol (Calc): 106 mg/dL (calc) — ABNORMAL HIGH
Non-HDL Cholesterol (Calc): 139 mg/dL (calc) — ABNORMAL HIGH (ref <130)
Total CHOL/HDL Ratio: 3.4 (calc) (ref <5.0)
Triglycerides: 212 mg/dL — ABNORMAL HIGH (ref <150)

## 2019-08-02 ENCOUNTER — Ambulatory Visit (INDEPENDENT_AMBULATORY_CARE_PROVIDER_SITE_OTHER): Payer: Medicare Other | Admitting: Nurse Practitioner

## 2019-08-02 ENCOUNTER — Other Ambulatory Visit: Payer: Self-pay

## 2019-08-02 ENCOUNTER — Encounter: Payer: Self-pay | Admitting: Nurse Practitioner

## 2019-08-02 VITALS — Ht 61.0 in | Wt 104.0 lb

## 2019-08-02 DIAGNOSIS — Z20828 Contact with and (suspected) exposure to other viral communicable diseases: Secondary | ICD-10-CM | POA: Diagnosis not present

## 2019-08-02 DIAGNOSIS — Z20822 Contact with and (suspected) exposure to covid-19: Secondary | ICD-10-CM

## 2019-08-02 NOTE — Patient Instructions (Signed)
COVID-19: Quarantine vs. Isolation QUARANTINE keeps someone who was in close contact with someone who has COVID-19 away from others. If you had close contact with a person who has COVID-19  Stay home until 14 days after your last contact.  Check your temperature twice a day and watch for symptoms of COVID-19.  If possible, stay away from people who are at higher-risk for getting very sick from COVID-19. ISOLATION keeps someone who is sick or tested positive for COVID-19 without symptoms away from others, even in their own home. If you are sick and think or know you have COVID-19  Stay home until after ? At least 10 days since symptoms first appeared and ? At least 24 hours with no fever without fever-reducing medication and ? Symptoms have improved If you tested positive for COVID-19 but do not have symptoms  Stay home until after ? 10 days have passed since your positive test If you live with others, stay in a specific "sick room" or area and away from other people or animals, including pets. Use a separate bathroom, if available. cdc.gov/coronavirus 02/19/2019 This information is not intended to replace advice given to you by your health care provider. Make sure you discuss any questions you have with your health care provider. Document Revised: 07/05/2019 Document Reviewed: 07/05/2019 Elsevier Patient Education  2020 [employer]  

## 2019-08-02 NOTE — Progress Notes (Signed)
Patient ID: Caroline Snyder, female   DOB: 01/09/1941, 78 y.o.   MRN: [id number]  This service is provided via telemedicine  No vital signs collected/recorded due to the encounter was a telemedicine visit.   Location of patient (ex: home, work):  Home  Patient consents to a telephone visit:  Yes  Location of the provider (ex: office, home):  [facility]  Name of any referring provider:  Dr.Tiffany Phelps Dodge of all persons participating in the telemedicine service and their role in the encounter:  Patient, Heriberto Antigua. RMA, Sherrie Mustache, PA.  Time spent on call:  4:44     Careteam: Patient Care Team: [name], NP as PCP - General (Geriatric Medicine) [name], MD as Consulting Physician (Ophthalmology) Calvert Cantor, MD as Consulting Physician (Ophthalmology) Terrance Mass, MD (Inactive) as Consulting Physician (Gynecology)  Advanced Directive information    Allergies  Allergen Reactions  . Vioxx [Rofecoxib] Nausea Only    Pt. Can't remember what reaction was but asks to leave on her profile.     Chief Complaint  Patient presents with  . Acute Visit    Questions about COVID     HPI: Patient is a 78 y.o. female via telephone visit for question due to [place].  Reports 9 days ago she was in the home of someone who had COVID.  She does not have any symptoms.  Thought that she only needed to stay in 7 days after exposure.  No fever or chill. Wants to go to a NYE party were she will be around other ppl.     Review of Systems:  Review of Systems  Constitutional: Negative for chills, fever and weight loss.  HENT: Negative for tinnitus.   Respiratory: Negative for cough, sputum production and shortness of breath.   Cardiovascular: Negative for chest pain, palpitations and leg swelling.  Gastrointestinal: Negative for abdominal pain, constipation, diarrhea and heartburn.  Genitourinary: Negative for dysuria, frequency and urgency.   Musculoskeletal: Negative for back pain, falls, joint pain and myalgias.  Skin: Negative.   Neurological: Negative for dizziness and headaches.    Past Medical History:  Diagnosis Date  . Allergy   . 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 Bunion 07/01/2010  . Depression 05/05/2007  . External hemorrhoids without mention of complication [id number]  . GERD (gastroesophageal reflux disease) 2000  . Hyperlipidemia 06/29/2006  . Memory loss 04/15/2003  . Migraine without aura, without mention of intractable migraine without mention of status migrainosus 03/24/1999  . Osteoporosis, unspecified 04/15/2004  . 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 Unspecified essential hypertension 11/22/2007   Past Surgical History:  Procedure Laterality Date  . COLONOSCOPY  06-16-2007   Internal hemorrhoids,laxity of anal sphincter,polyp at 25cm form anal verge, tortuous sigmoid colon. Dr.Orr   . EYE SURGERY Bilateral 2010   cataract Dr. Bing Plume  . VAGINAL HYSTERECTOMY  1980   Social History:   reports that she has never smoked. She has never used smokeless tobacco. She reports that she does not drink alcohol or use drugs.  Family History  Problem Relation Age of Onset  . Alzheimer's disease Mother   . Diabetes Mother   . Transient ischemic attack Mother   . Heart disease Mother        MI, CHF  . Alcohol abuse Father   . Kidney disease Father   . Hypertension Sister   . Hypertension Brother   . Heart disease Brother   . Hypertension Brother   .  Hypertension Sister   . Hypertension Brother   . Other Son        truck accident 07/2016  . Colon cancer Neg Hx   . Stomach cancer Neg Hx     Medications: Patient's Medications  New Prescriptions   No medications on file  Previous Medications   ASCORBIC ACID (VITAMIN C) 1000 MG TABLET    Take 1,000 mg by mouth daily.   ASPIRIN 81 MG TABLET    Take 81 mg by mouth daily. Take 1 tablet daily to prevent heart attack, stroke and  clotting.   CALCIUM-MAGNESIUM-VITAMIN D (CALCIUM 1200+D3 PO)    Take by mouth daily.   CHOLECALCIFEROL (HM VITAMIN D3) 2000 UNITS CAPS    Take 1 tablet daily    CINNAMON PO    1000 mg po qd   CRANBERRY-VITAMIN C-VITAMIN E (CRANBERRY PLUS VITAMIN C) 4200-20-3 MG-MG-UNIT CAPS    Take 1 tablet by mouth 2 (two) times daily.    LOSARTAN (COZAAR) 50 MG TABLET    TAKE 1 TABLET BY MOUTH TWICE A DAY   MELATONIN 3 MG CAPS    Take by mouth as needed.   MULTIPLE VITAMIN (MULTI VITAMIN DAILY PO)    Take by mouth. Centrum Silver for Women Take 1 tablet daily   MULTIPLE VITAMINS-MINERALS (ICAPS AREDS FORMULA PO)    Take by mouth 2 (two) times daily.   ONDANSETRON (ZOFRAN) 4 MG TABLET    Take one tablet by mouth every 6-8 hours as needed   ROSUVASTATIN (CRESTOR) 20 MG TABLET    Take 1 tablet (20 mg total) by mouth daily.   TRAZODONE (DESYREL) 50 MG TABLET    TAKE 1 TABLET BY MOUTH EVERYDAY AT BEDTIME  Modified Medications   No medications on file  Discontinued Medications   No medications on file    Physical Exam:  Vitals:   08/02/19 0936  Weight: 104 lb (47.2 kg)  Height: 5\' 1"  (1.549 m)   Body mass index is 19.65 kg/m. Wt Readings from Last 3 Encounters:  08/02/19 104 lb (47.2 kg)  06/14/19 104 lb 3.2 oz (47.3 kg)  05/17/19 103 lb (46.7 kg)      Labs reviewed: Basic Metabolic Panel: Recent Labs    12/14/18 0906 04/18/19 1057 06/13/19 1500  NA 141 139 136  K 4.4 4.2 3.9  CL 107 101 100  CO2 29 30 25   GLUCOSE 102* 75 100*  BUN 17 17 11   CREATININE 0.90 0.80 0.74  CALCIUM 9.6 10.4 9.4   Liver Function Tests: Recent Labs    10/17/18 0902 12/14/18 0906 06/13/19 1500  AST 24 20 24   ALT 15 14 19   ALKPHOS  --   --  48  BILITOT 0.4 0.4 0.6  PROT 6.5 6.7 7.1  ALBUMIN  --   --  4.3   No results for input(s): LIPASE, AMYLASE in the last 8760 hours. No results for input(s): AMMONIA in the last 8760 hours. CBC: Recent Labs    10/17/18 0902 04/18/19 1057 06/13/19 1500  WBC  4.8 4.4 5.3  NEUTROABS 3,000 2.5  --   HGB 13.1 13.8 13.2  HCT 38.1 41.1 40.4  MCV 89.9 91.1 93.7  PLT 261 296.0 263   Lipid Panel: Recent Labs    12/14/18 0906 04/26/19 0950 07/31/19 0857  CHOL 195 224* 197  HDL 65 66 58  LDLCALC 107* 137* 106*  TRIG 119 104 212*  CHOLHDL 3.0 3.4 3.4   TSH: No results for input(s):  TSH in the last 8760 hours. A1C: No results found for: HGBA1C   Assessment/Plan 1. Close exposure to COVID-19 virus -encouraged pt to refer to the CDC recommendations as well but educated her to stay quarantined for at least 10 days. Also advised against large gathering and parties at this time. To continue to wear mask, distance herself and proper hand hygiene.   Carlos American. Harle Battiest  [facility] [phone]    Virtual Visit via Telephone Note  I connected with pt on 08/02/19 at  9:30 AM EST by telephone and verified that I am speaking with the correct person using two identifiers.  Location: Patient: home Provider: [facility]   I discussed the limitations, risks, security and privacy concerns of performing an evaluation and management service by telephone and the availability of in person appointments. I also discussed with the patient that there may be a patient responsible charge related to this service. The patient expressed understanding and agreed to proceed.   I discussed the assessment and treatment plan with the patient. The patient was provided an opportunity to ask questions and all were answered. The patient agreed with the plan and demonstrated an understanding of the instructions.   The patient was advised to call back or seek an in-person evaluation if the symptoms worsen or if the condition fails to improve as anticipated.  I provided 6 minutes of non-face-to-face time during this encounter.  Carlos American. Harle Battiest Avs printed and mailed

## 2019-08-07 ENCOUNTER — Ambulatory Visit (INDEPENDENT_AMBULATORY_CARE_PROVIDER_SITE_OTHER): Payer: Medicare Other | Admitting: Family

## 2019-08-07 ENCOUNTER — Other Ambulatory Visit: Payer: Self-pay

## 2019-08-07 ENCOUNTER — Encounter: Payer: Self-pay | Admitting: Family

## 2019-08-07 VITALS — BP 142/82 | HR 68 | Temp 97.5°F | Resp 18 | Ht 60.0 in | Wt 104.6 lb

## 2019-08-07 DIAGNOSIS — I10 Essential (primary) hypertension: Secondary | ICD-10-CM

## 2019-08-07 DIAGNOSIS — R350 Frequency of micturition: Secondary | ICD-10-CM | POA: Diagnosis not present

## 2019-08-07 LAB — POCT URINALYSIS DIPSTICK
Bilirubin, UA: NEGATIVE
Blood, UA: NEGATIVE
Glucose, UA: NEGATIVE
Ketones, UA: NEGATIVE
Leukocytes, UA: NEGATIVE
Nitrite, UA: NEGATIVE
Protein, UA: NEGATIVE
Spec Grav, UA: 1.015 (ref 1.010–1.025)
Urobilinogen, UA: 0.2 E.U./dL
pH, UA: 6 (ref 5.0–8.0)

## 2019-08-07 MED ORDER — LOSARTAN POTASSIUM 100 MG PO TABS: 100.0000 mg | ORAL_TABLET | Freq: Every day | ORAL | 3 refills | Status: DC

## 2019-08-07 NOTE — Progress Notes (Signed)
Provider: Tinleigh Whitmire FNP-C  [name], NP  Patient Care Team: [name], NP as PCP - General (Geriatric Medicine) [name], MD as Consulting Physician (Ophthalmology) Calvert Cantor, MD as Consulting Physician (Ophthalmology) Terrance Mass, MD (Inactive) as Consulting Physician (Gynecology)  Extended Emergency Contact Information Primary Emergency Contact: Carma Lair of [place] Phone: [phone] Relation: Son Secondary Emergency Contact: [place] Phone: [phone] Relation: Other  Code Status: Full Code  Goals of care: Advanced Directive information Advanced Directives 06/14/2019  Does Patient Have a Medical Advance Directive? No  Does patient want to make changes to medical advance directive? -  Copy of [place] in Chart? -  Would patient like information on creating a medical advance directive? No - Patient declined  Pre-existing out of facility DNR order (yellow form or pink MOST form) -     Chief Complaint  Patient presents with  . Medication Management    1 month follow up on blood pressure, patient reports she is unable to sleep trazodone and melatonin doesnt work  . Medication Management    patient reports she has been taking losartan once a day and was not aware of taking more than once     HPI:  Pt is a 79 y.o. female seen today for an acute visit for 1 month follow up on blood pressure.she states blood pressure readings at home runs in the 130's but did not bring her log to visit.she denies any dizziness,headache,changes in vision,fatigue,palpitation,chest pain or shortness of breath.Patient not sure whether taking Losartan 50 mg tablet twice daily or once daily.states having issues with her memory if scheduled twice daily.she kept asking several times what trazodone and Zofran is supposed to be used for what despite explaining to her three times during visit but after short time  she would ask provider similar question.  patient reports she is unable to sleep though not taking trazodone.she states takes her melatonin.gets up 5-6 times to void. Drinks coffee several times per day until late at night.  Her blood pressure today 142/82 states took her blood pressure medication this morning.  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    Past Medical History:  Diagnosis Date  . Allergy   . 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 Bunion 07/01/2010  . Depression 05/05/2007  . External hemorrhoids without mention of complication [id number]  . GERD (gastroesophageal reflux disease) 2000  . Hyperlipidemia 06/29/2006  . Memory loss 04/15/2003  . Migraine without aura, without mention of intractable migraine without mention of status migrainosus 03/24/1999  . Osteoporosis, unspecified 04/15/2004  . 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 Unspecified essential hypertension 11/22/2007   Past Surgical History:  Procedure Laterality Date  . COLONOSCOPY  06-16-2007   Internal hemorrhoids,laxity of anal sphincter,polyp at 25cm form anal verge, tortuous sigmoid colon. Dr.Orr   . EYE SURGERY Bilateral 2010   cataract Dr. Bing Plume  . VAGINAL HYSTERECTOMY  1980    Allergies  Allergen Reactions  . Vioxx [Rofecoxib] Nausea Only    Pt. Can't remember what reaction was but asks to leave on her profile.     Outpatient Encounter Medications as of 08/07/2019  Medication Sig  . Ascorbic Acid (VITAMIN C) 1000 MG tablet Take 1,000 mg by mouth daily.  Marland Kitchen aspirin 81 MG tablet Take 81 mg by mouth daily. Take  1 tablet daily to prevent heart attack, stroke and clotting.  . Calcium-Magnesium-Vitamin D (CALCIUM 1200+D3 PO) Take by mouth daily.  . Cholecalciferol (HM VITAMIN D3) 2000 UNITS CAPS Take 1 tablet daily   . CINNAMON PO 1000 mg po qd  .  Cranberry-Vitamin C-Vitamin E (CRANBERRY PLUS VITAMIN C) 4200-20-3 MG-MG-UNIT CAPS Take 1 tablet by mouth daily.   Marland Kitchen losartan (COZAAR) 50 MG tablet TAKE 1 TABLET BY MOUTH TWICE A DAY  . Melatonin 3 MG CAPS Take by mouth as needed.  . Multiple Vitamin (MULTI VITAMIN DAILY PO) Take by mouth. Centrum Silver for Women Take 1 tablet daily  . Multiple Vitamins-Minerals (ICAPS AREDS FORMULA PO) Take by mouth 2 (two) times daily.  . ondansetron (ZOFRAN) 4 MG tablet Take one tablet by mouth every 6-8 hours as needed  . rosuvastatin (CRESTOR) 20 MG tablet Take 1 tablet (20 mg total) by mouth daily.  . traZODone (DESYREL) 50 MG tablet TAKE 1 TABLET BY MOUTH EVERYDAY AT BEDTIME   No facility-administered encounter medications on file as of 08/07/2019.    Review of Systems  Constitutional: Negative for appetite change, chills, fatigue and fever.  HENT: Negative for congestion, rhinorrhea, sinus pressure, sinus pain, sneezing and sore throat.   Eyes: Negative for discharge, redness, itching and visual disturbance.  Respiratory: Negative for cough, chest tightness, shortness of breath and wheezing.   Cardiovascular: Negative for chest pain, palpitations and leg swelling.  Gastrointestinal: Negative for abdominal distention, abdominal pain, constipation, diarrhea, nausea and vomiting.  Genitourinary: Negative for decreased urine volume, difficulty urinating, dysuria, flank pain, hematuria and urgency.       Gets up 5-6 times at night   Musculoskeletal: Negative for gait problem and myalgias.  Skin: Negative for color change, pallor and rash.  Hematological: Does not bruise/bleed easily.  Psychiatric/Behavioral: Positive for sleep disturbance. Negative for agitation and confusion. The patient is not nervous/anxious.        Having to get up to uses the bathroom keeps her awake.Drinks coffee up to late at night.     Immunization History  Administered Date(s) Administered  . Fluad Quad(high Dose 65+)  04/26/2019  . Influenza, High Dose Seasonal PF 03/11/2017, 06/09/2018  . Influenza,inj,Quad PF,6+ Mos 07/19/2013, 05/28/2016, 06/02/2017  . Influenza-Unspecified 05/23/2015  . PPD Test 11/30/2013, 05/30/2015  . Pneumococcal Conjugate-13 05/28/2016  . Pneumococcal Polysaccharide-23 06/09/2018  . Tdap 11/17/2018  . Zoster 11/19/2009   Pertinent  Health Maintenance Due  Topic Date Due  . MAMMOGRAM  07/18/2020  . COLONOSCOPY  05/13/2022  . INFLUENZA VACCINE  Completed  . DEXA SCAN  Completed  . PNA vac Low Risk Adult  Completed   Fall Risk  08/07/2019 06/26/2019 06/14/2019 06/13/2019 04/26/2019  Falls in the past year? 0 0 0 0 0  Number falls in past yr: 0 0 0 0 0  Injury with Fall? 0 0 0 0 0      Vitals:   08/07/19 0905  BP: (!) 142/82  Pulse: 68  Temp: (!) 97.5 F (36.4 C)  TempSrc: Temporal  SpO2: 98%  Weight: 104 lb 9.6 oz (47.4 kg)  Height: 5' (1.524 m)   Body mass index is 20.43 kg/m. Physical Exam Vitals reviewed.  Constitutional:      General: She is not in acute distress.    Appearance: She is normal weight. She is not ill-appearing.  HENT:     Head: Normocephalic.     Nose: Nose normal. No congestion or rhinorrhea.  Mouth/Throat:     Mouth: Mucous membranes are moist.     Pharynx: Oropharynx is clear. No oropharyngeal exudate or posterior oropharyngeal erythema.  Eyes:     General: No scleral icterus.       Right eye: No discharge.        Left eye: No discharge.     Extraocular Movements: Extraocular movements intact.     Conjunctiva/sclera: Conjunctivae normal.     Pupils: Pupils are equal, round, and reactive to light.  Neck:     Vascular: No carotid bruit.  Cardiovascular:     Rate and Rhythm: Normal rate and regular rhythm.     Pulses: Normal pulses.     Heart sounds: Normal heart sounds. No murmur. No friction rub. No gallop.   Pulmonary:     Effort: Pulmonary effort is normal. No respiratory distress.     Breath sounds: Normal breath sounds.  No wheezing, rhonchi or rales.  Chest:     Chest wall: No tenderness.  Abdominal:     General: Bowel sounds are normal. There is no distension.     Palpations: Abdomen is soft. There is no mass.     Tenderness: There is no abdominal tenderness. There is no right CVA tenderness, left CVA tenderness, guarding or rebound.  Musculoskeletal:     Cervical back: Normal range of motion. No rigidity or tenderness.  Lymphadenopathy:     Cervical: No cervical adenopathy.  Neurological:     Mental Status: She is alert.  Psychiatric:        Mood and Affect: Mood normal.        Speech: Speech normal.        Behavior: Behavior normal.        Thought Content: Thought content normal.        Cognition and Memory: Memory is impaired.        Judgment: Judgment normal.     Comments: Forgetful     Labs reviewed: Recent Labs    12/14/18 0906 04/18/19 1057 06/13/19 1500  NA 141 139 136  K 4.4 4.2 3.9  CL 107 101 100  CO2 29 30 25   GLUCOSE 102* 75 100*  BUN 17 17 11   CREATININE 0.90 0.80 0.74  CALCIUM 9.6 10.4 9.4   Recent Labs    10/17/18 0902 12/14/18 0906 06/13/19 1500  AST 24 20 24   ALT 15 14 19   ALKPHOS  --   --  48  BILITOT 0.4 0.4 0.6  PROT 6.5 6.7 7.1  ALBUMIN  --   --  4.3   Recent Labs    10/17/18 0902 04/18/19 1057 06/13/19 1500  WBC 4.8 4.4 5.3  NEUTROABS 3,000 2.5  --   HGB 13.1 13.8 13.2  HCT 38.1 41.1 40.4  MCV 89.9 91.1 93.7  PLT 261 296.0 263   Lab Results  Component Value Date   TSH 2.05 03/11/2017   No results found for: HGBA1C Lab Results  Component Value Date   CHOL 197 07/31/2019   HDL 58 07/31/2019   LDLCALC 106 (H) 07/31/2019   TRIG 212 (H) 07/31/2019   CHOLHDL 3.4 07/31/2019    Significant Diagnostic Results in last 30 days:  MM 3D SCREEN BREAST BILATERAL  Result Date: 07/19/2019 CLINICAL DATA:  Screening. EXAM: DIGITAL SCREENING BILATERAL MAMMOGRAM WITH TOMO AND CAD COMPARISON:  Previous exam(s). ACR Breast Density Category c: The  breast tissue is heterogeneously dense, which may obscure small masses. FINDINGS: There are no findings suspicious for  malignancy. Images were processed with CAD. IMPRESSION: No mammographic evidence of malignancy. A result letter of this screening mammogram will be mailed directly to the patient. RECOMMENDATION: Screening mammogram in one year. (Code:SM-B-01Y) BI-RADS CATEGORY  1: Negative. Electronically Signed   By: Nolon Nations M.D.   On: 07/19/2019 12:52    Assessment/Plan 1. Essential hypertension B/p not at goal.Not taking losartan 50 mg tablet twice daily as directed.Will change losartan to 100 mg tablet one by mouth once daily. Encouraged to monitor B/p and notify provider if SBP> 150  - losartan (COZAAR) 100 MG tablet; Take 1 tablet (100 mg total) by mouth daily.  Dispense: 30 tablet; Refill: 3  2. Urinary frequency Afebrile.Reports Frequent urination at night with some slight discomfort. - POC Urinalysis Dipstick results are normal.No further testing required. - Encouraged to cut down on the number of coffee she drinks and to cease drinking liquids by 6 pm except small sips if needed to prevent frequent urination at bedtime and sleep disturbances.   Family/ staff Communication: Reviewed plan of care with patient.   Labs/tests ordered: None   Kiowa Peifer C Kyilee Gregg, NP

## 2019-08-07 NOTE — Patient Instructions (Addendum)
-   check blood pressure once daily at home notify provider for any B/p > 150 on three consistent readings. - Cut the fluid intake including coffee in the evenings to prevent you from getting up frequently at night   - Urine specimen dip stick done today shows no signs of infection increase water intake 6-8 glasses daily.

## 2019-08-12 ENCOUNTER — Other Ambulatory Visit: Payer: Self-pay | Admitting: Nurse Practitioner

## 2019-08-12 DIAGNOSIS — G4700 Insomnia, unspecified: Secondary | ICD-10-CM

## 2019-08-21 ENCOUNTER — Telehealth: Payer: Self-pay

## 2019-08-21 NOTE — Telephone Encounter (Signed)
We reviewed the lab scheduled for the month of March 2021 and seen patient has a pending appointment for 10/18/2019 and no orders pending.  After chart review, patient seen Caroline Sax, NP on 04/26/2019 and below is Caroline Snyder's instructions:  Return in about 6 months (around 10/24/2019) for labs one week prior to appt for medical management of chronic issues .  Caroline Snyder placed orders at the time of visit to be collected in March 2021.   Patient then had a telehealth visit with her PCP,  Caroline Chandler, NP for acute concerns on 06/11/2019. Caroline Snyder ([facility] lab tech) released lab orders on 06/11/2019 meant for October 18, 2019.  This is why no orders show when we check the orders review tab. Per Caroline Snyder those orders are active for 6 months. Caroline Snyder states her process is the day before to look in Epic and see if patient has pending orders, if not she checks her system and it will indicate labs on hold if released early. If no labs show in her system she will then consult with [facility] clinical staff.

## 2019-08-22 ENCOUNTER — Other Ambulatory Visit: Payer: Self-pay

## 2019-08-22 ENCOUNTER — Ambulatory Visit: Payer: Medicare Other | Attending: Internal Medicine

## 2019-08-22 DIAGNOSIS — Z23 Encounter for immunization: Secondary | ICD-10-CM | POA: Diagnosis not present

## 2019-08-22 NOTE — Progress Notes (Signed)
   Covid-19 Vaccination Clinic  Name:  Caroline Snyder    MRN: [id number] DOB: 08-31-1940  08/22/2019  [name] was observed post Covid-19 immunization for 15 minutes without incidence. She was provided with Vaccine Information Sheet and instruction to access the V-Safe system.   [name] was instructed to call 911 with any severe reactions post vaccine: Marland Kitchen Difficulty breathing  . Swelling of your face and throat  . A fast heartbeat  . A bad rash all over your body  . Dizziness and weakness    Immunizations Administered    Name Date Dose VIS Date Route   Pfizer COVID-19 Vaccine 08/22/2019 11:00 AM 0.3 mL 07/13/2019 Intramuscular   Manufacturer: [place]   Lot: [id number]   [place]: [id number]

## 2019-09-01 ENCOUNTER — Other Ambulatory Visit: Payer: Self-pay | Admitting: Family

## 2019-09-12 ENCOUNTER — Ambulatory Visit: Payer: Medicare Other | Attending: Internal Medicine

## 2019-09-12 DIAGNOSIS — Z23 Encounter for immunization: Secondary | ICD-10-CM | POA: Insufficient documentation

## 2019-09-12 NOTE — Progress Notes (Signed)
   Covid-19 Vaccination Clinic  Name:  LENIKA OBRIANT    MRN: [id number] DOB: 11/23/1940  09/12/2019  Ms. Rodin was observed post Covid-19 immunization for 15 minutes without incidence. She was provided with Vaccine Information Sheet and instruction to access the V-Safe system.   Ms. Overfield was instructed to call 911 with any severe reactions post vaccine: Marland Kitchen Difficulty breathing  . Swelling of your face and throat  . A fast heartbeat  . A bad rash all over your body  . Dizziness and weakness    Immunizations Administered    Name Date Dose VIS Date Route   Pfizer COVID-19 Vaccine 09/12/2019  2:07 PM 0.3 mL 07/13/2019 Intramuscular   Manufacturer: [place]   Lot: [id number]   [place]: [id number]

## 2019-10-18 ENCOUNTER — Other Ambulatory Visit: Payer: Medicare Other

## 2019-10-22 ENCOUNTER — Other Ambulatory Visit: Payer: Self-pay

## 2019-10-22 ENCOUNTER — Other Ambulatory Visit: Payer: Medicare Other

## 2019-10-22 DIAGNOSIS — E782 Mixed hyperlipidemia: Secondary | ICD-10-CM | POA: Diagnosis not present

## 2019-10-22 DIAGNOSIS — I10 Essential (primary) hypertension: Secondary | ICD-10-CM | POA: Diagnosis not present

## 2019-10-22 LAB — CBC WITH DIFFERENTIAL/PLATELET
Absolute Monocytes: 302 cells/uL (ref 200–950)
Basophils Absolute: 80 cells/uL (ref 0–200)
Basophils Relative: 1.9 %
Eosinophils Absolute: 101 cells/uL (ref 15–500)
Eosinophils Relative: 2.4 %
HCT: 37.4 % (ref 35.0–45.0)
Hemoglobin: 12.7 g/dL (ref 11.7–15.5)
Lymphs Abs: 1499 cells/uL (ref 850–3900)
MCH: 30.7 pg (ref 27.0–33.0)
MCHC: 34 g/dL (ref 32.0–36.0)
MCV: 90.3 fL (ref 80.0–100.0)
MPV: 10.2 fL (ref 7.5–12.5)
Monocytes Relative: 7.2 %
Neutro Abs: 2218 cells/uL (ref 1500–7800)
Neutrophils Relative %: 52.8 %
Platelets: 290 10*3/uL (ref 140–400)
RBC: 4.14 10*6/uL (ref 3.80–5.10)
RDW: 12.1 % (ref 11.0–15.0)
Total Lymphocyte: 35.7 %
WBC: 4.2 10*3/uL (ref 3.8–10.8)

## 2019-10-22 LAB — COMPLETE METABOLIC PANEL WITH GFR
AG Ratio: 2.1 (calc) (ref 1.0–2.5)
ALT: 15 U/L (ref 6–29)
AST: 22 U/L (ref 10–35)
Albumin: 4.6 g/dL (ref 3.6–5.1)
Alkaline phosphatase (APISO): 44 U/L (ref 37–153)
BUN: 13 mg/dL (ref 7–25)
CO2: 26 mmol/L (ref 20–32)
Calcium: 9.4 mg/dL (ref 8.6–10.4)
Chloride: 106 mmol/L (ref 98–110)
Creat: 0.89 mg/dL (ref 0.60–0.93)
GFR, Est African American: 71 mL/min/{1.73_m2} (ref >=60)
GFR, Est Non African American: 62 mL/min/{1.73_m2} (ref >=60)
Globulin: 2.2 g/dL (calc) (ref 1.9–3.7)
Glucose, Bld: 99 mg/dL (ref 65–99)
Potassium: 4.1 mmol/L (ref 3.5–5.3)
Sodium: 140 mmol/L (ref 135–146)
Total Bilirubin: 0.4 mg/dL (ref 0.2–1.2)
Total Protein: 6.8 g/dL (ref 6.1–8.1)

## 2019-10-22 LAB — LIPID PANEL
Cholesterol: 198 mg/dL (ref <200)
HDL: 73 mg/dL (ref >=50)
LDL Cholesterol (Calc): 110 mg/dL (calc) — ABNORMAL HIGH
Non-HDL Cholesterol (Calc): 125 mg/dL (calc) (ref <130)
Total CHOL/HDL Ratio: 2.7 (calc) (ref <5.0)
Triglycerides: 66 mg/dL (ref <150)

## 2019-10-22 LAB — TSH: TSH: 2.71 mIU/L (ref 0.40–4.50)

## 2019-10-25 ENCOUNTER — Ambulatory Visit (INDEPENDENT_AMBULATORY_CARE_PROVIDER_SITE_OTHER): Payer: Medicare Other | Admitting: Family

## 2019-10-25 ENCOUNTER — Encounter: Payer: Self-pay | Admitting: Family

## 2019-10-25 ENCOUNTER — Other Ambulatory Visit: Payer: Self-pay

## 2019-10-25 VITALS — BP 130/70 | HR 64 | Temp 98.2°F | Ht 60.0 in | Wt 104.0 lb

## 2019-10-25 DIAGNOSIS — G4700 Insomnia, unspecified: Secondary | ICD-10-CM | POA: Diagnosis not present

## 2019-10-25 DIAGNOSIS — I10 Essential (primary) hypertension: Secondary | ICD-10-CM | POA: Diagnosis not present

## 2019-10-25 DIAGNOSIS — E782 Mixed hyperlipidemia: Secondary | ICD-10-CM

## 2019-10-25 DIAGNOSIS — K2100 Gastro-esophageal reflux disease with esophagitis, without bleeding: Secondary | ICD-10-CM

## 2019-10-25 NOTE — Progress Notes (Signed)
Provider: Jarita Raval FNP-C   [name], NP  Patient Care Team: [name], NP as PCP - General (Geriatric Medicine) [name], MD as Consulting Physician (Ophthalmology) Calvert Cantor, MD as Consulting Physician (Ophthalmology) Terrance Mass, MD (Inactive) as Consulting Physician (Gynecology)  Extended Emergency Contact Information Primary Emergency Contact: Carma Lair of [place] Phone: [phone] Relation: Son Secondary Emergency Contact: [place] Phone: [phone] Relation: Other  Code Status: Full code  Goals of care: Advanced Directive information Advanced Directives 06/14/2019  Does Patient Have a Medical Advance Directive? No  Does patient want to make changes to medical advance directive? -  Copy of [place] in Chart? -  Would patient like information on creating a medical advance directive? No - Patient declined  Pre-existing out of facility DNR order (yellow form or pink MOST form) -     Chief Complaint  Patient presents with  . Medical Management of Chronic Issues    8[facility] follow-up    HPI:  Pt is a 79y.o. 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 Recent lab worker reviewed and discussed with patient this visit.  Hyperlipidemia - chol 198,TRG 66 and LDL 110.states likes eating fried chicken.low carbohydrates,low saturated fats and high vegetable diet discussed.on rosuvastatin 20 mg tablet daily.  Hypertension - no home blood pressure readings for review.on Losartan 100 mg tablet daily.on ASA 81 mg tablet daily and rosuvastatin 20 mg tablet daily for cardiovascular event  prevention.   GERD - denies any symptoms.Not on any meds.  Insomnia - on trazodone 50 mg tablet at bedtime and Melatonin 3 mg capsule as needed.  Past Medical History:  Diagnosis Date  . Allergy   . 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 Bunion 07/01/2010  . Depression 05/05/2007  . External hemorrhoids without mention of complication 053/66/4403 . GERD (gastroesophageal reflux disease) 2000  . Hyperlipidemia 06/29/2006  . Memory loss 04/15/2003  . Migraine without aura, without mention of intractable migraine without mention of status migrainosus 03/24/1999  . Osteoporosis, unspecified 04/15/2004  . 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 Unspecified essential hypertension 11/22/2007   Past Surgical History:  Procedure Laterality Date  . COLONOSCOPY  06-16-2007   Internal hemorrhoids,laxity of anal sphincter,polyp at 25cm form anal verge, tortuous sigmoid colon. Dr.Orr   . EYE SURGERY Bilateral 2010   cataract Dr. DBing Plume . VAGINAL HYSTERECTOMY  1980    Allergies  Allergen Reactions  . Vioxx [Rofecoxib] Nausea Only    Pt. Can't remember what reaction was but asks to leave on her profile.     Allergies as of 10/25/2019      Reactions   Vioxx [rofecoxib] Nausea Only   Pt. Can't remember what reaction was but asks to leave on her profile.       Medication List       Accurate as of October 25, 2019  9:18 AM. If you have any questions, ask your nurse or doctor.        aspirin 81 MG tablet Take 81 mg by mouth daily. Take 1 tablet daily to prevent heart attack, stroke and clotting.   CALCIUM 1200+D3 PO Take by mouth  daily.   CINNAMON PO 1000 mg po qd   Cranberry Plus Vitamin C 4200-20-3 MG-MG-UNIT Caps Generic drug: Cranberry-Vitamin C-Vitamin E Take 1 tablet by mouth daily.   HM Vitamin D3 50 MCG (2000 UT) Caps Generic drug: Cholecalciferol Take 1 tablet daily   ICAPS AREDS FORMULA PO Take by mouth 2 (two) times daily.   losartan 100 MG  tablet Commonly known as: COZAAR Take 1 tablet (100 mg total) by mouth daily.   Melatonin 3 MG Caps Take by mouth as needed.   MULTI VITAMIN DAILY PO Take by mouth. Centrum Silver for Women Take 1 tablet daily   ondansetron 4 MG tablet Commonly known as: ZOFRAN Take one tablet by mouth every 6-8 hours as needed   rosuvastatin 20 MG tablet Commonly known as: CRESTOR TAKE 1 TABLET BY MOUTH EVERY DAY   traZODone 50 MG tablet Commonly known as: DESYREL TAKE 1 TABLET BY MOUTH EVERYDAY AT BEDTIME   vitamin C 1000 MG tablet Take 1,000 mg by mouth daily.       Review of Systems  Constitutional: Negative for appetite change, chills, fatigue and fever.  Respiratory: Negative for cough, chest tightness, shortness of breath and wheezing.   Cardiovascular: Negative for chest pain, palpitations and leg swelling.  Gastrointestinal: Negative for abdominal distention, abdominal pain, constipation, diarrhea, nausea and vomiting.  Endocrine: Negative for cold intolerance, heat intolerance, polydipsia, polyphagia and polyuria.  Genitourinary: Negative for difficulty urinating, dysuria, flank pain, urgency and vaginal bleeding.       Wakes up 4-5 times per night ongoing   Musculoskeletal: Positive for arthralgias. Negative for gait problem and myalgias.       Right hip catch sometimes   Skin: Negative for color change, pallor, rash and wound.  Neurological: Negative for dizziness, speech difficulty, weakness, light-headedness and numbness.       Occasional headache   Hematological: Does not bruise/bleed easily.  Psychiatric/Behavioral: Negative for agitation, confusion and sleep disturbance. The patient is not nervous/anxious.     Immunization History  Administered Date(s) Administered  . Fluad Quad(high Dose 65+) 04/26/2019  . Influenza, High Dose Seasonal PF 03/11/2017, 06/09/2018  . Influenza,inj,Quad PF,6+ Mos 07/19/2013, 05/28/2016, 06/02/2017  . Influenza-Unspecified 05/23/2015   . PFIZER SARS-COV-2 Vaccination 08/22/2019, 09/12/2019  . PPD Test 11/30/2013, 05/30/2015  . Pneumococcal Conjugate-13 05/28/2016  . Pneumococcal Polysaccharide-23 06/09/2018  . Tdap 11/17/2018  . Zoster 11/19/2009   Pertinent  Health Maintenance Due  Topic Date Due  . MAMMOGRAM  07/18/2020  . COLONOSCOPY  05/13/2022  . INFLUENZA VACCINE  Completed  . DEXA SCAN  Completed  . PNA vac Low Risk Adult  Completed   Fall Risk  10/25/2019 08/07/2019 06/26/2019 06/14/2019 06/13/2019  Falls in the past year? 0 0 0 0 0  Number falls in past yr: 0 0 0 0 0  Injury with Fall? 0 0 0 0 0    Vitals:   10/25/19 0904  BP: 130/70  Pulse: 64  Temp: 98.2 F (36.8 C)  TempSrc: Tympanic  SpO2: 98%  Weight: 104 lb (47.2 kg)  Height: 5' (1.524 m)   Body mass index is 20.31 kg/m. Physical Exam Vitals reviewed.  Constitutional:      General: She is not in acute distress.    Appearance: She is not ill-appearing.  HENT:     Head: Normocephalic.     Right Ear: Tympanic membrane, ear canal and external ear normal. There is no impacted cerumen.     Left Ear: Tympanic membrane,  ear canal and external ear normal. There is no impacted cerumen.     Nose: Nose normal. No congestion or rhinorrhea.     Mouth/Throat:     Mouth: Mucous membranes are moist.     Pharynx: Oropharynx is clear. No oropharyngeal exudate or posterior oropharyngeal erythema.  Eyes:     General: No scleral icterus.       Right eye: No discharge.        Left eye: No discharge.     Extraocular Movements: Extraocular movements intact.     Conjunctiva/sclera: Conjunctivae normal.     Pupils: Pupils are equal, round, and reactive to light.  Neck:     Vascular: No carotid bruit.  Cardiovascular:     Rate and Rhythm: Normal rate and regular rhythm.     Pulses: Normal pulses.     Heart sounds: Normal heart sounds. No murmur. No friction rub. No gallop.   Pulmonary:     Effort: Pulmonary effort is normal. No respiratory distress.      Breath sounds: Normal breath sounds. No wheezing, rhonchi or rales.  Chest:     Chest wall: No tenderness.  Abdominal:     General: Bowel sounds are normal. There is no distension.     Palpations: Abdomen is soft. There is no mass.     Tenderness: There is no abdominal tenderness. There is no right CVA tenderness, left CVA tenderness, guarding or rebound.  Musculoskeletal:        General: No swelling or tenderness. Normal range of motion.     Cervical back: Normal range of motion. No rigidity or tenderness.     Right lower leg: No edema.     Left lower leg: No edema.  Lymphadenopathy:     Cervical: No cervical adenopathy.  Skin:    General: Skin is warm.     Coloration: Skin is not pale.     Findings: No bruising, erythema or rash.  Neurological:     Mental Status: She is alert and oriented to person, place, and time.     Cranial Nerves: No cranial nerve deficit.     Sensory: No sensory deficit.     Motor: No weakness.     Coordination: Coordination normal.     Gait: Gait normal.  Psychiatric:        Mood and Affect: Mood normal.        Behavior: Behavior normal.        Thought Content: Thought content normal.        Judgment: Judgment normal.    Labs reviewed: Recent Labs    04/18/19 1057 06/13/19 1500 10/22/19 1047  NA 139 136 140  K 4.2 3.9 4.1  CL 101 100 106  CO2 '30 25 26  '$ GLUCOSE 75 100* 99  BUN '17 11 13  '$ CREATININE 0.80 0.74 0.89  CALCIUM 10.4 9.4 9.4   Recent Labs    12/14/18 0906 06/13/19 1500 10/22/19 1047  AST '20 24 22  '$ ALT '14 19 15  '$ ALKPHOS  --  48  --   BILITOT 0.4 0.6 0.4  PROT 6.7 7.1 6.8  ALBUMIN  --  4.3  --    Recent Labs    04/18/19 1057 06/13/19 1500 10/22/19 1047  WBC 4.4 5.3 4.2  NEUTROABS 2.5  --  2,218  HGB 13.8 13.2 12.7  HCT 41.1 40.4 37.4  MCV 91.1 93.7 90.3  PLT 296.0 263 290   Lab Results  Component Value Date  TSH 2.71 10/22/2019   No results found for: HGBA1C Lab Results  Component Value Date   CHOL 198  10/22/2019   HDL 73 10/22/2019   LDLCALC 110 (H) 10/22/2019   TRIG 66 10/22/2019   CHOLHDL 2.7 10/22/2019    Significant Diagnostic Results in last 30 days:  No results found.  Assessment/Plan 1. Essential hypertension B/p at goal. - continue on Losartan 100 mg tablet daily.Continue on ASA 81 mg tablet daily and rosuvastatin 20 mg tablet daily for cardiovascular event prevention.  - continue dietary and lifestyle modification. - CBC with Differential/Platelet; Future - CMP with eGFR([facility]); Future - TSH; Future  2. Mixed hyperlipidemia LDL not at goal. - low carbohydrates,low saturated fats and high vegetable diet advised. - continue on rosuvastatin 20 mg tablet daily. - Lipid panel; Future  3. Gastroesophageal reflux disease with esophagitis without hemorrhage H/H stable.symptoms under control.Not on meds. - CBC with Differential/Platelet; Future  4. Insomnia, unspecified type Continue on trazodone 50 mg tablet at bedtime and Melatonin 3 mg capsule as needed. - Additional education information provided on AVS  Family/ staff Communication: Reviewed plan of care with patient verbalized understanding.   Labs/tests ordered:  - CBC with Differential/Platelet; Future - CMP with eGFR([facility]); Future - TSH; Future - Lipid panel; Future  Next Appointment : 6 months for medical management of chronic issues.Labs 2-4 days prior to visit.   Sandrea Hughs, NP

## 2019-10-25 NOTE — Patient Instructions (Signed)
- continue current medication  Insomnia Insomnia is a sleep disorder that makes it difficult to fall asleep or stay asleep. Insomnia can cause fatigue, low energy, difficulty concentrating, mood swings, and poor performance at work or school. There are three different ways to classify insomnia:  Difficulty falling asleep.  Difficulty staying asleep.  Waking up too early in the morning. Any type of insomnia can be long-term (chronic) or short-term (acute). Both are common. Short-term insomnia usually lasts for three months or less. Chronic insomnia occurs at least three times a week for longer than three months. What are the causes? Insomnia may be caused by another condition, situation, or substance, such as:  Anxiety.  Certain medicines.  Gastroesophageal reflux disease (GERD) or other gastrointestinal conditions.  Asthma or other breathing conditions.  Restless legs syndrome, sleep apnea, or other sleep disorders.  Chronic pain.  Menopause.  Stroke.  Abuse of alcohol, tobacco, or illegal drugs.  Mental health conditions, such as depression.  Caffeine.  Neurological disorders, such as Alzheimer's disease.  An overactive thyroid (hyperthyroidism). Sometimes, the cause of insomnia may not be known. What increases the risk? Risk factors for insomnia include:  Gender. Women are affected more often than men.  Age. Insomnia is more common as you get older.  Stress.  Lack of exercise.  Irregular work schedule or working night shifts.  Traveling between different time zones.  Certain medical and mental health conditions. What are the signs or symptoms? If you have insomnia, the main symptom is having trouble falling asleep or having trouble staying asleep. This may lead to other symptoms, such as:  Feeling fatigued or having low energy.  Feeling nervous about going to sleep.  Not feeling rested in the morning.  Having trouble concentrating.  Feeling  irritable, anxious, or depressed. How is this diagnosed? This condition may be diagnosed based on:  Your symptoms and medical history. Your health care provider may ask about: ? Your sleep habits. ? Any medical conditions you have. ? Your mental health.  A physical exam. How is this treated? Treatment for insomnia depends on the cause. Treatment may focus on treating an underlying condition that is causing insomnia. Treatment may also include:  Medicines to help you sleep.  Counseling or therapy.  Lifestyle adjustments to help you sleep better. Follow these instructions at home: Eating and drinking   Limit or avoid alcohol, caffeinated beverages, and cigarettes, especially close to bedtime. These can disrupt your sleep.  Do not eat a large meal or eat spicy foods right before bedtime. This can lead to digestive discomfort that can make it hard for you to sleep. Sleep habits   Keep a sleep diary to help you and your health care provider figure out what could be causing your insomnia. Write down: ? When you sleep. ? When you wake up during the night. ? How well you sleep. ? How rested you feel the next day. ? Any side effects of medicines you are taking. ? What you eat and drink.  Make your bedroom a dark, comfortable place where it is easy to fall asleep. ? Put up shades or blackout curtains to block light from outside. ? Use a white noise machine to block noise. ? Keep the temperature cool.  Limit screen use before bedtime. This includes: ? Watching TV. ? Using your smartphone, tablet, or computer.  Stick to a routine that includes going to bed and waking up at the same times every day and night. This can help  you fall asleep faster. Consider making a quiet activity, such as reading, part of your nighttime routine.  Try to avoid taking naps during the day so that you sleep better at night.  Get out of bed if you are still awake after 15 minutes of trying to sleep.  Keep the lights down, but try reading or doing a quiet activity. When you feel sleepy, go back to bed. General instructions  Take over-the-counter and prescription medicines only as told by your health care provider.  Exercise regularly, as told by your health care provider. Avoid exercise starting several hours before bedtime.  Use relaxation techniques to manage stress. Ask your health care provider to suggest some techniques that may work well for you. These may include: ? Breathing exercises. ? Routines to release muscle tension. ? Visualizing peaceful scenes.  Make sure that you drive carefully. Avoid driving if you feel very sleepy.  Keep all follow-up visits as told by your health care provider. This is important. Contact a health care provider if:  You are tired throughout the day.  You have trouble in your daily routine due to sleepiness.  You continue to have sleep problems, or your sleep problems get worse. Get help right away if:  You have serious thoughts about hurting yourself or someone else. If you ever feel like you may hurt yourself or others, or have thoughts about taking your own life, get help right away. You can go to your nearest emergency department or call:  Your local emergency services (911 in the [place]  A suicide crisis helpline, such as the [facility] at (754)697-5691. This is open 24 hours a day. Summary  Insomnia is a sleep disorder that makes it difficult to fall asleep or stay asleep.  Insomnia can be long-term (chronic) or short-term (acute).  Treatment for insomnia depends on the cause. Treatment may focus on treating an underlying condition that is causing insomnia.  Keep a sleep diary to help you and your health care provider figure out what could be causing your insomnia. This information is not intended to replace advice given to you by your health care provider. Make sure you discuss any questions you have with  your health care provider. Document Revised: 07/01/2017 Document Reviewed: 04/28/2017 Elsevier Patient Education  2020 [employer].

## 2019-10-30 ENCOUNTER — Other Ambulatory Visit: Payer: Self-pay | Admitting: Family

## 2019-10-30 DIAGNOSIS — I10 Essential (primary) hypertension: Secondary | ICD-10-CM

## 2020-04-21 ENCOUNTER — Other Ambulatory Visit: Payer: Medicare Other

## 2020-04-21 ENCOUNTER — Ambulatory Visit (INDEPENDENT_AMBULATORY_CARE_PROVIDER_SITE_OTHER): Payer: Medicare Other

## 2020-04-21 ENCOUNTER — Other Ambulatory Visit: Payer: Self-pay

## 2020-04-21 DIAGNOSIS — Z23 Encounter for immunization: Secondary | ICD-10-CM | POA: Diagnosis not present

## 2020-04-21 DIAGNOSIS — E782 Mixed hyperlipidemia: Secondary | ICD-10-CM | POA: Diagnosis not present

## 2020-04-21 DIAGNOSIS — K2100 Gastro-esophageal reflux disease with esophagitis, without bleeding: Secondary | ICD-10-CM | POA: Diagnosis not present

## 2020-04-21 DIAGNOSIS — I10 Essential (primary) hypertension: Secondary | ICD-10-CM

## 2020-04-22 LAB — CBC WITH DIFFERENTIAL/PLATELET
Absolute Monocytes: 413 cells/uL (ref 200–950)
Basophils Absolute: 83 cells/uL (ref 0–200)
Basophils Relative: 1.4 %
Eosinophils Absolute: 342 cells/uL (ref 15–500)
Eosinophils Relative: 5.8 %
HCT: 38.2 % (ref 35.0–45.0)
Hemoglobin: 12.9 g/dL (ref 11.7–15.5)
Lymphs Abs: 1746 cells/uL (ref 850–3900)
MCH: 31.1 pg (ref 27.0–33.0)
MCHC: 33.8 g/dL (ref 32.0–36.0)
MCV: 92 fL (ref 80.0–100.0)
MPV: 10.6 fL (ref 7.5–12.5)
Monocytes Relative: 7 %
Neutro Abs: 3316 cells/uL (ref 1500–7800)
Neutrophils Relative %: 56.2 %
Platelets: 295 10*3/uL (ref 140–400)
RBC: 4.15 10*6/uL (ref 3.80–5.10)
RDW: 12 % (ref 11.0–15.0)
Total Lymphocyte: 29.6 %
WBC: 5.9 10*3/uL (ref 3.8–10.8)

## 2020-04-22 LAB — COMPLETE METABOLIC PANEL WITH GFR
AG Ratio: 1.7 (calc) (ref 1.0–2.5)
ALT: 12 U/L (ref 6–29)
AST: 20 U/L (ref 10–35)
Albumin: 4.5 g/dL (ref 3.6–5.1)
Alkaline phosphatase (APISO): 50 U/L (ref 37–153)
BUN/Creatinine Ratio: 20 (calc) (ref 6–22)
BUN: 19 mg/dL (ref 7–25)
CO2: 27 mmol/L (ref 20–32)
Calcium: 9.7 mg/dL (ref 8.6–10.4)
Chloride: 104 mmol/L (ref 98–110)
Creat: 0.96 mg/dL — ABNORMAL HIGH (ref 0.60–0.88)
GFR, Est African American: 65 mL/min/{1.73_m2} (ref >=60)
GFR, Est Non African American: 56 mL/min/{1.73_m2} — ABNORMAL LOW (ref >=60)
Globulin: 2.6 g/dL (calc) (ref 1.9–3.7)
Glucose, Bld: 98 mg/dL (ref 65–99)
Potassium: 5 mmol/L (ref 3.5–5.3)
Sodium: 140 mmol/L (ref 135–146)
Total Bilirubin: 0.4 mg/dL (ref 0.2–1.2)
Total Protein: 7.1 g/dL (ref 6.1–8.1)

## 2020-04-22 LAB — LIPID PANEL
Cholesterol: 203 mg/dL — ABNORMAL HIGH (ref <200)
HDL: 68 mg/dL (ref >=50)
LDL Cholesterol (Calc): 114 mg/dL (calc) — ABNORMAL HIGH
Non-HDL Cholesterol (Calc): 135 mg/dL (calc) — ABNORMAL HIGH (ref <130)
Total CHOL/HDL Ratio: 3 (calc) (ref <5.0)
Triglycerides: 105 mg/dL (ref <150)

## 2020-04-22 LAB — TSH: TSH: 3.71 mIU/L (ref 0.40–4.50)

## 2020-04-25 ENCOUNTER — Ambulatory Visit (INDEPENDENT_AMBULATORY_CARE_PROVIDER_SITE_OTHER): Payer: Medicare Other | Admitting: Nurse Practitioner

## 2020-04-25 ENCOUNTER — Ambulatory Visit: Payer: Medicare Other | Admitting: Family

## 2020-04-25 ENCOUNTER — Encounter: Payer: Self-pay | Admitting: Nurse Practitioner

## 2020-04-25 ENCOUNTER — Other Ambulatory Visit: Payer: Self-pay

## 2020-04-25 VITALS — BP 150/70 | HR 63 | Temp 98.1°F | Resp 18 | Ht 62.0 in | Wt 105.6 lb

## 2020-04-25 DIAGNOSIS — I10 Essential (primary) hypertension: Secondary | ICD-10-CM | POA: Diagnosis not present

## 2020-04-25 DIAGNOSIS — M810 Age-related osteoporosis without current pathological fracture: Secondary | ICD-10-CM | POA: Diagnosis not present

## 2020-04-25 DIAGNOSIS — R413 Other amnesia: Secondary | ICD-10-CM

## 2020-04-25 DIAGNOSIS — G4700 Insomnia, unspecified: Secondary | ICD-10-CM

## 2020-04-25 DIAGNOSIS — F339 Major depressive disorder, recurrent, unspecified: Secondary | ICD-10-CM

## 2020-04-25 DIAGNOSIS — E782 Mixed hyperlipidemia: Secondary | ICD-10-CM

## 2020-04-25 DIAGNOSIS — K219 Gastro-esophageal reflux disease without esophagitis: Secondary | ICD-10-CM

## 2020-04-25 NOTE — Progress Notes (Signed)
Careteam: Patient Care Team: [name], NP as PCP - General (Geriatric Medicine) [name], MD as Consulting Physician (Ophthalmology) Calvert Cantor, MD as Consulting Physician (Ophthalmology) Terrance Mass, MD (Inactive) as Consulting Physician (Gynecology)  PLACE OF SERVICE:  [facility]  Advanced Directive information    Allergies  Allergen Reactions   Vioxx [Rofecoxib] Nausea Only    Pt. Can't remember what reaction was but asks to leave on her profile.     Chief Complaint  Patient presents with   Medical Management of Chronic Issues    6 Month Follow Up and Discuss Lab (Lab Printed)     HPI: Patient is a 79 y.o. female for routine follow up  htn- generally sbp ~140 at home, has a headache- will occasionally get a headache and take tylenol but did not this morning. Has taken medication. Took losartan 100 mg this morning  Eats frozen meals and sandwiches all the time. Lives alone and does not cook anymore.   Hyperlipidemia- on crestor 20 mg daily   Insomnia- occasionally will use trazodone to sleep. Does not sleep well generally.   osteoporosis- followed by GYN, per records she was taking fosamax but she does not remember taking.   Reports she is more forgetful. Told her son she can not remember a lot.  Does a pill box that she sets up her medication so she can take it.   Depression- reports more sadness recently, tearful when thinking about her youngest son who died in a car accident.    Review of Systems:  Review of Systems  Constitutional: Negative for chills, fever and weight loss.  HENT: Negative for hearing loss and tinnitus.   Respiratory: Negative for cough, sputum production and shortness of breath.   Cardiovascular: Negative for chest pain, palpitations and leg swelling.  Gastrointestinal: Negative for abdominal pain, constipation, diarrhea and heartburn.  Genitourinary: Negative for dysuria, frequency and urgency.    Musculoskeletal: Negative for back pain, falls, joint pain and myalgias.  Skin: Negative.   Neurological: Positive for headaches. Negative for dizziness.  Psychiatric/Behavioral: Positive for depression and memory loss. The patient is nervous/anxious. The patient does not have insomnia.    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   Bunion 07/01/2010   Depression 05/05/2007   External hemorrhoids without mention of complication 99/24/2683   GERD (gastroesophageal reflux disease) 2000   Hyperlipidemia 06/29/2006   Memory loss 04/15/2003   Migraine without aura, without mention of intractable migraine without mention of status migrainosus 03/24/1999   Osteoporosis, unspecified 04/15/2004   Tear film insufficiency, unspecified 03/24/1999   Unspecified essential hypertension 11/22/2007   Past Surgical History:  Procedure Laterality Date   COLONOSCOPY  06-16-2007   Internal hemorrhoids,laxity of anal sphincter,polyp at 25cm form anal verge, tortuous sigmoid colon. Dr.Orr    EYE SURGERY Bilateral 2010   cataract Dr. Bing Plume   VAGINAL HYSTERECTOMY  1980   Social History:   reports that she has never smoked. She has never used smokeless tobacco. She reports that she does not drink alcohol and does not use drugs.  Family History  Problem Relation Age of Onset   Alzheimer's disease Mother    Diabetes Mother    Transient ischemic attack Mother    Heart disease Mother        MI, CHF   Alcohol abuse Father    Kidney disease Father    Hypertension Sister  Hypertension Brother    Heart disease Brother    Hypertension Brother    Hypertension Sister    Hypertension Brother    Other Son        truck accident 07/2016   Colon cancer Neg Hx    Stomach cancer Neg Hx     Medications: Patient's Medications  New Prescriptions   No medications on file  Previous Medications   ASCORBIC ACID (VITAMIN C) 1000 MG  TABLET    Take 1,000 mg by mouth daily.   ASPIRIN 81 MG TABLET    Take 81 mg by mouth daily. Take 1 tablet daily to prevent heart attack, stroke and clotting.   CALCIUM-MAGNESIUM-VITAMIN D (CALCIUM 1200+D3 PO)    Take by mouth daily.   CHOLECALCIFEROL (HM VITAMIN D3) 2000 UNITS CAPS    Take 1 tablet daily    CINNAMON PO    1000 mg po qd   CRANBERRY-VITAMIN C-VITAMIN E (CRANBERRY PLUS VITAMIN C) 4200-20-3 MG-MG-UNIT CAPS    Take 1 tablet by mouth daily.    LOSARTAN (COZAAR) 100 MG TABLET    TAKE 1 TABLET BY MOUTH EVERY DAY   MELATONIN 3 MG CAPS    Take by mouth as needed.   MULTIPLE VITAMIN (MULTI VITAMIN DAILY PO)    Take by mouth. Centrum Silver for Women Take 1 tablet daily   MULTIPLE VITAMINS-MINERALS (ICAPS AREDS FORMULA PO)    Take by mouth 2 (two) times daily.   ONDANSETRON (ZOFRAN) 4 MG TABLET    Take one tablet by mouth every 6-8 hours as needed   ROSUVASTATIN (CRESTOR) 20 MG TABLET    TAKE 1 TABLET BY MOUTH EVERY DAY   TRAZODONE (DESYREL) 50 MG TABLET    TAKE 1 TABLET BY MOUTH EVERYDAY AT BEDTIME  Modified Medications   No medications on file  Discontinued Medications   No medications on file    Physical Exam:  Vitals:   04/25/20 1007  BP: (!) 150/70  Pulse: 63  Resp: 18  Temp: 98.1 F (36.7 C)  TempSrc: Temporal  SpO2: 96%  Weight: 105 lb 9.6 oz (47.9 kg)  Height: 5\' 2"  (1.575 m)   Body mass index is 19.31 kg/m. Wt Readings from Last 3 Encounters:  04/25/20 105 lb 9.6 oz (47.9 kg)  10/25/19 104 lb (47.2 kg)  08/07/19 104 lb 9.6 oz (47.4 kg)    Physical Exam Constitutional:      General: She is not in acute distress.    Appearance: She is well-developed. She is not diaphoretic.  HENT:     Head: Normocephalic and atraumatic.     Mouth/Throat:     Pharynx: No oropharyngeal exudate.  Eyes:     Conjunctiva/sclera: Conjunctivae normal.     Pupils: Pupils are equal, round, and reactive to light.  Cardiovascular:     Rate and Rhythm: Normal rate and regular  rhythm.     Heart sounds: Normal heart sounds.  Pulmonary:     Effort: Pulmonary effort is normal.     Breath sounds: Normal breath sounds.  Abdominal:     General: Bowel sounds are normal.     Palpations: Abdomen is soft.  Musculoskeletal:        General: No tenderness.     Cervical back: Normal range of motion and neck supple.  Skin:    General: Skin is warm and dry.  Neurological:     Mental Status: She is alert and oriented to person, place, and time.  Psychiatric:  Mood and Affect: Mood normal.        Behavior: Behavior normal.    Labs reviewed: Basic Metabolic Panel: Recent Labs    06/13/19 1500 10/22/19 1047 04/21/20 0940  NA 136 140 140  K 3.9 4.1 5.0  CL 100 106 104  CO2 25 26 27   GLUCOSE 100* 99 98  BUN 11 13 19   CREATININE 0.74 0.89 0.96*  CALCIUM 9.4 9.4 9.7  TSH  --  2.71 3.71   Liver Function Tests: Recent Labs    06/13/19 1500 10/22/19 1047 04/21/20 0940  AST 24 22 20   ALT 19 15 12   ALKPHOS 48  --   --   BILITOT 0.6 0.4 0.4  PROT 7.1 6.8 7.1  ALBUMIN 4.3  --   --    No results for input(s): LIPASE, AMYLASE in the last 8760 hours. No results for input(s): AMMONIA in the last 8760 hours. CBC: Recent Labs    06/13/19 1500 10/22/19 1047 04/21/20 0940  WBC 5.3 4.2 5.9  NEUTROABS  --  2,218 3,316  HGB 13.2 12.7 12.9  HCT 40.4 37.4 38.2  MCV 93.7 90.3 92.0  PLT 263 290 295   Lipid Panel: Recent Labs    07/31/19 0857 10/22/19 1047 04/21/20 0940  CHOL 197 198 203*  HDL 58 73 68  LDLCALC 106* 110* 114*  TRIG 212* 66 105  CHOLHDL 3.4 2.7 3.0   TSH: Recent Labs    10/22/19 1047 04/21/20 0940  TSH 2.71 3.71   A1C: No results found for: HGBA1C   Assessment/Plan 1. Essential hypertension -elevated, not well controlled at home  -will add norvasc 5 mg by mouth daily to help with better control. -encouraged low sodium diet.   2. Age-related osteoporosis without current pathological fracture -followed by GYN, does not  appear to be on medication at this time. Continues on cal and vit d, has follow up next month,   3. Gastroesophageal reflux disease without esophagitis Stable at this time.   4. Insomnia, unspecified type Continues on trazodone, has a hard time sleeping most nights, encouraged to use trazodone every night at bedtime as she is only using PRN to help with sleep and depression.  5. Mixed hyperlipidemia Continues on crestor 20 mg daily with dietary modifications.   6. Depression, recurrent ([place]) Ongoing, encouraged trazodone 50 mg qhs at this time. No thought of SI or HI  7. Memory loss Pt reports she has noticed worsening memory, will follow up MMSE in 2 weeks.   Next appt: 2 weeks on blood pressure. Carlos American. [place], [facility] [phone]

## 2020-04-25 NOTE — Patient Instructions (Signed)
To start norvasc 5 mg daily for blood pressure ADD this to your other medication.   Do not ADD salt to food  1 cup on coffee with caffeine- can change to decaf after your first cup Increase water intake.   Follow up in 2 weeks.

## 2020-05-09 DIAGNOSIS — Z23 Encounter for immunization: Secondary | ICD-10-CM | POA: Diagnosis not present

## 2020-05-14 ENCOUNTER — Ambulatory Visit: Payer: Medicare Other | Admitting: Nurse Practitioner

## 2020-05-21 ENCOUNTER — Ambulatory Visit (INDEPENDENT_AMBULATORY_CARE_PROVIDER_SITE_OTHER): Payer: Medicare Other | Admitting: Nurse Practitioner

## 2020-05-21 ENCOUNTER — Other Ambulatory Visit: Payer: Self-pay

## 2020-05-21 ENCOUNTER — Encounter: Payer: Self-pay | Admitting: Nurse Practitioner

## 2020-05-21 VITALS — BP 164/80 | HR 62 | Temp 96.2°F | Ht 62.0 in | Wt 107.0 lb

## 2020-05-21 DIAGNOSIS — I10 Essential (primary) hypertension: Secondary | ICD-10-CM

## 2020-05-21 DIAGNOSIS — R413 Other amnesia: Secondary | ICD-10-CM

## 2020-05-21 MED ORDER — AMLODIPINE BESYLATE 5 MG PO TABS: 5.0000 mg | ORAL_TABLET | Freq: Every day | ORAL | 1 refills | Status: DC

## 2020-05-21 MED ORDER — DONEPEZIL HCL 5 MG PO TABS: 5.0000 mg | ORAL_TABLET | Freq: Every day | ORAL | 0 refills | Status: DC

## 2020-05-21 NOTE — Patient Instructions (Addendum)
To start Aricept 5 mg by mouth at bedtime to help preserve memory To ADD NORVASC (amlodipine) 5 mg by mouth daily to your daily regimen of blood pressure medications.   Follow up next week for blood pressure- make sure to bring blood pressure LOG and TAKE YOUR MEDICATION BEFORE YOUR OFFICE VISIT.   BRING MEDICATION BOTTLES TO NEXT VISIT.

## 2020-05-21 NOTE — Progress Notes (Signed)
Careteam: Patient Care Team: [name], NP as PCP - General (Geriatric Medicine) [name], MD as Consulting Physician (Ophthalmology) Calvert Cantor, MD as Consulting Physician (Ophthalmology) Terrance Mass, MD (Inactive) as Consulting Physician (Gynecology)  PLACE OF SERVICE:  [facility]  Advanced Directive information    Allergies  Allergen Reactions  . Vioxx [Rofecoxib] Nausea Only    Pt. Can't remember what reaction was but asks to leave on her profile.     Chief Complaint  Patient presents with  . Follow-up    2 week follow-up on blood presssure. Patient c/o worse headaches x last few weeks.      HPI: Patient is a 79 y.o. female for follow up on blood pressure. Eats a lot of sandwiches and soups.  Taking losartan 100 mg but has not taken her medication this morning.  Did not bring her blood pressure log with her either.  Reports she is not sure if she takes her blood pressure before or after she takes her medication. Reports she does not generally take medication until around this time.    She can tell her memory has gotten worse. MMSE today 25/30. Age related atrophy and chronic small vessl ischemia noted on CT   Review of Systems:  Review of Systems  Constitutional: Negative for chills, fever and weight loss.  HENT: Negative for tinnitus.   Respiratory: Negative for cough, sputum production and shortness of breath.   Cardiovascular: Negative for chest pain, palpitations and leg swelling.  Gastrointestinal: Negative for abdominal pain, constipation, diarrhea and heartburn.  Genitourinary: Negative for dysuria, frequency and urgency.  Musculoskeletal: Negative for back pain, falls, joint pain and myalgias.  Skin: Negative.   Neurological: Positive for headaches. Negative for dizziness.  Psychiatric/Behavioral: Negative for depression and memory loss. The patient does not have insomnia.     Past Medical History:  Diagnosis Date  . Allergy    . 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 Bunion 07/01/2010  . Depression 05/05/2007  . External hemorrhoids without mention of complication 26/83/4196  . GERD (gastroesophageal reflux disease) 2000  . Hyperlipidemia 06/29/2006  . Memory loss 04/15/2003  . Migraine without aura, without mention of intractable migraine without mention of status migrainosus 03/24/1999  . Osteoporosis, unspecified 04/15/2004  . 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 Unspecified essential hypertension 11/22/2007   Past Surgical History:  Procedure Laterality Date  . COLONOSCOPY  06-16-2007   Internal hemorrhoids,laxity of anal sphincter,polyp at 25cm form anal verge, tortuous sigmoid colon. Dr.Orr   . EYE SURGERY Bilateral 2010   cataract Dr. Bing Plume  . VAGINAL HYSTERECTOMY  1980   Social History:   reports that she has never smoked. She has never used smokeless tobacco. She reports that she does not drink alcohol and does not use drugs.  Family History  Problem Relation Age of Onset  . Alzheimer's disease Mother   . Diabetes Mother   . Transient ischemic attack Mother   . Heart disease Mother        MI, CHF  . Alcohol abuse Father   . Kidney disease Father   . Hypertension Sister   . Hypertension Brother   . Heart disease Brother   . Hypertension Brother   . Hypertension Sister   . Hypertension Brother   . Other Son        truck accident 07/2016  . Colon cancer Neg Hx   . Stomach cancer Neg Hx     Medications: Patient's Medications  New Prescriptions   No medications on file  Previous Medications   ASCORBIC ACID (VITAMIN C) 1000 MG TABLET    Take 1,000 mg by mouth daily.   ASPIRIN 81 MG TABLET    Take 81 mg by mouth daily. Take 1 tablet daily to prevent heart attack, stroke and clotting.   CALCIUM-MAGNESIUM-VITAMIN D (CALCIUM 1200+D3 PO)    Take by mouth daily.   CHOLECALCIFEROL (HM VITAMIN D3) 2000 UNITS CAPS    Take 1 tablet daily    CINNAMON PO    1000 mg po qd    CRANBERRY-VITAMIN C-VITAMIN E (CRANBERRY PLUS VITAMIN C) 4200-20-3 MG-MG-UNIT CAPS    Take 1 tablet by mouth daily.    LOSARTAN (COZAAR) 100 MG TABLET    TAKE 1 TABLET BY MOUTH EVERY DAY   MELATONIN 3 MG CAPS    Take by mouth as needed.   MULTIPLE VITAMIN (MULTI VITAMIN DAILY PO)    Take by mouth. Centrum Silver for Women Take 1 tablet daily   MULTIPLE VITAMINS-MINERALS (ICAPS AREDS FORMULA PO)    Take by mouth 2 (two) times daily.   ONDANSETRON (ZOFRAN) 4 MG TABLET    Take one tablet by mouth every 6-8 hours as needed   ROSUVASTATIN (CRESTOR) 20 MG TABLET    TAKE 1 TABLET BY MOUTH EVERY DAY   TRAZODONE (DESYREL) 50 MG TABLET    TAKE 1 TABLET BY MOUTH EVERYDAY AT BEDTIME  Modified Medications   No medications on file  Discontinued Medications   No medications on file    Physical Exam:  Vitals:   05/21/20 0936  BP: (!) 164/80  Pulse: 62  Temp: (!) 96.2 F (35.7 C)  TempSrc: Temporal  SpO2: 97%  Weight: 107 lb (48.5 kg)  Height: 5\' 2"  (1.575 m)   Body mass index is 19.57 kg/m. Wt Readings from Last 3 Encounters:  05/21/20 107 lb (48.5 kg)  04/25/20 105 lb 9.6 oz (47.9 kg)  10/25/19 104 lb (47.2 kg)    Physical Exam Constitutional:      General: She is not in acute distress.    Appearance: She is well-developed. She is not diaphoretic.  HENT:     Head: Normocephalic and atraumatic.  Cardiovascular:     Rate and Rhythm: Normal rate and regular rhythm.     Heart sounds: Normal heart sounds.  Pulmonary:     Effort: Pulmonary effort is normal.     Breath sounds: Normal breath sounds.  Abdominal:     General: Bowel sounds are normal.     Palpations: Abdomen is soft.  Musculoskeletal:        General: No tenderness.     Cervical back: Normal range of motion and neck supple.  Skin:    General: Skin is warm and dry.  Neurological:     Mental Status: She is alert and oriented to person, place, and time.    MMSE - Mini Mental State Exam 05/21/2020 06/09/2018 06/02/2017    Orientation to time 5 5 4   Orientation to Place 3 5 5   Orientation to Place-comments Did not know the name of office or NP that she was seeing today - -  Registration 3 3 3   Attention/ Calculation 5 5 5   Recall 0 0 0  Language- name 2 objects 2 2 2   Language- repeat 1 1 1   Language- follow 3 step command 3 3 3   Language- read & follow direction 1 1 1   Write a sentence 1 1 1  Copy design 1 1 1   Total score 25 27 26      Labs reviewed: Basic Metabolic Panel: Recent Labs    06/13/19 1500 10/22/19 1047 04/21/20 0940  NA 136 140 140  K 3.9 4.1 5.0  CL 100 106 104  CO2 25 26 27   GLUCOSE 100* 99 98  BUN 11 13 19   CREATININE 0.74 0.89 0.96*  CALCIUM 9.4 9.4 9.7  TSH  --  2.71 3.71   Liver Function Tests: Recent Labs    06/13/19 1500 10/22/19 1047 04/21/20 0940  AST 24 22 20   ALT 19 15 12   ALKPHOS 48  --   --   BILITOT 0.6 0.4 0.4  PROT 7.1 6.8 7.1  ALBUMIN 4.3  --   --    No results for input(s): LIPASE, AMYLASE in the last 8760 hours. No results for input(s): AMMONIA in the last 8760 hours. CBC: Recent Labs    06/13/19 1500 10/22/19 1047 04/21/20 0940  WBC 5.3 4.2 5.9  NEUTROABS  --  2,218 3,316  HGB 13.2 12.7 12.9  HCT 40.4 37.4 38.2  MCV 93.7 90.3 92.0  PLT 263 290 295   Lipid Panel: Recent Labs    07/31/19 0857 10/22/19 1047 04/21/20 0940  CHOL 197 198 203*  HDL 58 73 68  LDLCALC 106* 110* 114*  TRIG 212* 66 105  CHOLHDL 3.4 2.7 3.0   TSH: Recent Labs    10/22/19 1047 04/21/20 0940  TSH 2.71 3.71   A1C: No results found for: HGBA1C  Assessment/Plan 1. Memory loss -family noticing memory loss; and she has noticed this has worsened as well. MMSE obtained today 25/30. CT head obtained in November which reveals Findings consistent with age related atrophy and chronic small vessel ischemia. 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  - donepezil (ARICEPT) 5 MG tablet; Take 1 tablet (5 mg total) by mouth at  bedtime.  Dispense: 30 tablet; Refill: 0  2. Essential hypertension Continues to be elevated, norvasc has not been started.  To take medication prior to next OV and bring in blood pressure log. - amLODipine (NORVASC) 5 MG tablet; Take 1 tablet (5 mg total) by mouth daily.  Dispense: 30 tablet; Refill: 1  Next appt: 05/30/2020 Caroline Snyder. [place], [facility] [phone]

## 2020-05-24 ENCOUNTER — Other Ambulatory Visit: Payer: Self-pay | Admitting: Nurse Practitioner

## 2020-05-26 ENCOUNTER — Encounter: Payer: Medicare Other | Admitting: Obstetrics & Gynecology

## 2020-05-30 ENCOUNTER — Ambulatory Visit (INDEPENDENT_AMBULATORY_CARE_PROVIDER_SITE_OTHER): Payer: Medicare Other | Admitting: Nurse Practitioner

## 2020-05-30 ENCOUNTER — Other Ambulatory Visit: Payer: Self-pay

## 2020-05-30 ENCOUNTER — Encounter: Payer: Self-pay | Admitting: Nurse Practitioner

## 2020-05-30 VITALS — BP 156/74 | HR 70 | Temp 97.7°F | Resp 20 | Ht 62.0 in | Wt 106.2 lb

## 2020-05-30 DIAGNOSIS — I10 Essential (primary) hypertension: Secondary | ICD-10-CM | POA: Diagnosis not present

## 2020-05-30 DIAGNOSIS — R519 Headache, unspecified: Secondary | ICD-10-CM

## 2020-05-30 DIAGNOSIS — R413 Other amnesia: Secondary | ICD-10-CM | POA: Diagnosis not present

## 2020-05-30 MED ORDER — AMLODIPINE BESYLATE 10 MG PO TABS: 10.0000 mg | ORAL_TABLET | Freq: Every day | ORAL | 1 refills | Status: DC

## 2020-05-30 NOTE — Progress Notes (Signed)
Careteam: Patient Care Team: [name], NP as PCP - General (Geriatric Medicine) [name], MD as Consulting Physician (Ophthalmology) Calvert Cantor, MD as Consulting Physician (Ophthalmology) Terrance Mass, MD (Inactive) as Consulting Physician (Gynecology)  PLACE OF SERVICE:  [facility] Directive information Does Patient Have a Medical Advance Directive?: No  Allergies  Allergen Reactions  . Vioxx [Rofecoxib] Nausea Only    Pt. Can't remember what reaction was but asks to leave on her profile.     Chief Complaint  Patient presents with  . Follow-up    Bp Follow Up     HPI: Patient is a 79 y.o. female for follow up on blood pressure.  Did not remember to bring her blood pressure log Reports she is taking norvasc 5 mg daily with losartan 100 mg daily.  Does not forget medication- takes out of pill box.  Reports her headaches are better. (has not had one in 5 or 6 days)  Reports strong family hx of htn.  Eats a lot of TV dinners, cold cuts/sandwhich meats for continence.  Continues to talk about her memory loss, started the aricept. No side effects noted from that.   .   Review of Systems:  Review of Systems  Constitutional: Negative for chills, fever and weight loss.  HENT: Negative for hearing loss and tinnitus.   Eyes: Negative for blurred vision.  Respiratory: Negative for cough and shortness of breath.   Cardiovascular: Negative for chest pain, palpitations and leg swelling.  Genitourinary: Negative for dysuria, frequency and urgency.  Skin: Negative.   Neurological: Negative for dizziness and headaches.  Psychiatric/Behavioral: Positive for memory loss. Negative for depression. The patient does not have insomnia.     Past Medical History:  Diagnosis Date  . Allergy   . 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 Bunion 07/01/2010  . Depression 05/05/2007  . External hemorrhoids without mention of complication 03/47/4259  .  GERD (gastroesophageal reflux disease) 2000  . Hyperlipidemia 06/29/2006  . Memory loss 04/15/2003  . Migraine without aura, without mention of intractable migraine without mention of status migrainosus 03/24/1999  . Osteoporosis, unspecified 04/15/2004  . 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 Unspecified essential hypertension 11/22/2007   Past Surgical History:  Procedure Laterality Date  . COLONOSCOPY  06-16-2007   Internal hemorrhoids,laxity of anal sphincter,polyp at 25cm form anal verge, tortuous sigmoid colon. Dr.Orr   . EYE SURGERY Bilateral 2010   cataract Dr. Bing Plume  . VAGINAL HYSTERECTOMY  1980   Social History:   reports that she has never smoked. She has never used smokeless tobacco. She reports that she does not drink alcohol and does not use drugs.  Family History  Problem Relation Age of Onset  . Alzheimer's disease Mother   . Diabetes Mother   . Transient ischemic attack Mother   . Heart disease Mother        MI, CHF  . Alcohol abuse Father   . Kidney disease Father   . Hypertension Sister   . Hypertension Brother   . Heart disease Brother   . Hypertension Brother   . Hypertension Sister   . Hypertension Brother   . Other Son        truck accident 07/2016  . Colon cancer Neg Hx   . Stomach cancer Neg Hx     Medications: Patient's Medications  New Prescriptions   No medications on file  Previous Medications   AMLODIPINE (NORVASC) 5 MG TABLET  Take 1 tablet (5 mg total) by mouth daily.   ASCORBIC ACID (VITAMIN C) 1000 MG TABLET    Take 1,000 mg by mouth daily.   ASPIRIN 81 MG TABLET    Take 81 mg by mouth daily. Take 1 tablet daily to prevent heart attack, stroke and clotting.   CALCIUM-MAGNESIUM-VITAMIN D (CALCIUM 1200+D3 PO)    Take by mouth daily.   CHOLECALCIFEROL (HM VITAMIN D3) 2000 UNITS CAPS    Take 1 tablet daily    CINNAMON PO    1000 mg po qd   CRANBERRY-VITAMIN C-VITAMIN E (CRANBERRY PLUS VITAMIN C) 4200-20-3 MG-MG-UNIT CAPS     Take 1 tablet by mouth daily.    DONEPEZIL (ARICEPT) 5 MG TABLET    Take 1 tablet (5 mg total) by mouth at bedtime.   LOSARTAN (COZAAR) 100 MG TABLET    TAKE 1 TABLET BY MOUTH EVERY DAY   MELATONIN 3 MG CAPS    Take by mouth as needed.   MULTIPLE VITAMIN (MULTI VITAMIN DAILY PO)    Take by mouth. Centrum Silver for Women Take 1 tablet daily   MULTIPLE VITAMINS-MINERALS (ICAPS AREDS FORMULA PO)    Take by mouth 2 (two) times daily.   ONDANSETRON (ZOFRAN) 4 MG TABLET    Take one tablet by mouth every 6-8 hours as needed   ROSUVASTATIN (CRESTOR) 20 MG TABLET    TAKE 1 TABLET BY MOUTH EVERY DAY   TRAZODONE (DESYREL) 50 MG TABLET    TAKE 1 TABLET BY MOUTH EVERYDAY AT BEDTIME  Modified Medications   No medications on file  Discontinued Medications   No medications on file    Physical Exam:  Vitals:   05/30/20 1039  BP: (!) 160/90  Pulse: 70  Resp: 20  Temp: 97.7 F (36.5 C)  TempSrc: Temporal  SpO2: 97%  Weight: 106 lb 3.2 oz (48.2 kg)  Height: 5\' 2"  (1.575 m)   Body mass index is 19.42 kg/m. Wt Readings from Last 3 Encounters:  05/30/20 106 lb 3.2 oz (48.2 kg)  05/21/20 107 lb (48.5 kg)  04/25/20 105 lb 9.6 oz (47.9 kg)    Physical Exam Constitutional:      General: She is not in acute distress.    Appearance: She is well-developed. She is not diaphoretic.  HENT:     Head: Normocephalic and atraumatic.     Mouth/Throat:     Pharynx: No oropharyngeal exudate.  Eyes:     Conjunctiva/sclera: Conjunctivae normal.     Pupils: Pupils are equal, round, and reactive to light.  Cardiovascular:     Rate and Rhythm: Normal rate and regular rhythm.     Heart sounds: Normal heart sounds.  Pulmonary:     Effort: Pulmonary effort is normal.     Breath sounds: Normal breath sounds.  Abdominal:     General: Bowel sounds are normal.     Palpations: Abdomen is soft.  Musculoskeletal:        General: No tenderness.     Cervical back: Normal range of motion and neck supple.    Skin:    General: Skin is warm and dry.  Neurological:     Mental Status: She is alert. Mental status is at baseline.     Labs reviewed: Basic Metabolic Panel: Recent Labs    06/13/19 1500 10/22/19 1047 04/21/20 0940  NA 136 140 140  K 3.9 4.1 5.0  CL 100 106 104  CO2 25 26 27   GLUCOSE 100* 99 98  BUN 11 13 19   CREATININE 0.74 0.89 0.96*  CALCIUM 9.4 9.4 9.7  TSH  --  2.71 3.71   Liver Function Tests: Recent Labs    06/13/19 1500 10/22/19 1047 04/21/20 0940  AST 24 22 20   ALT 19 15 12   ALKPHOS 48  --   --   BILITOT 0.6 0.4 0.4  PROT 7.1 6.8 7.1  ALBUMIN 4.3  --   --    No results for input(s): LIPASE, AMYLASE in the last 8760 hours. No results for input(s): AMMONIA in the last 8760 hours. CBC: Recent Labs    06/13/19 1500 10/22/19 1047 04/21/20 0940  WBC 5.3 4.2 5.9  NEUTROABS  --  2,218 3,316  HGB 13.2 12.7 12.9  HCT 40.4 37.4 38.2  MCV 93.7 90.3 92.0  PLT 263 290 295   Lipid Panel: Recent Labs    07/31/19 0857 10/22/19 1047 04/21/20 0940  CHOL 197 198 203*  HDL 58 73 68  LDLCALC 106* 110* 114*  TRIG 212* 66 105  CHOLHDL 3.4 2.7 3.0   TSH: Recent Labs    10/22/19 1047 04/21/20 0940  TSH 2.71 3.71   A1C: No results found for: HGBA1C   Assessment/Plan 1. Essential hypertension Continues to not be controlled.  Eats high sodium diet due to living alone and for convenience. Continue losartan and increase norvasc to 10 mg daily, educated about dietary modifications.  - amLODipine (NORVASC) 10 MG tablet; Take 1 tablet (10 mg total) by mouth daily.  Dispense: 30 tablet; Refill: 1  2. Memory loss -started on aricept 5 mg, tolerating well. Will follow up in 3 weeks if continues to tolerate will increase to 10 mg daily at follow up.  3. Nonintractable episodic headache, unspecified headache type Improved at this time.   Next appt: 3 weeks.  Carlos American. [place], [facility] [phone]

## 2020-05-30 NOTE — Patient Instructions (Signed)
DASH Eating Plan DASH stands for "Dietary Approaches to Stop Hypertension." The DASH eating plan is a healthy eating plan that has been shown to reduce high blood pressure (hypertension). It may also reduce your risk for type 2 diabetes, heart disease, and stroke. The DASH eating plan may also help with weight loss. What are tips for following this plan?  General guidelines  Avoid eating more than 2,300 mg (milligrams) of salt (sodium) a day. If you have hypertension, you may need to reduce your sodium intake to 1,500 mg a day.  Limit alcohol intake to no more than 1 drink a day for nonpregnant women and 2 drinks a day for men. One drink equals 12 oz of beer, 5 oz of wine, or 1 oz of hard liquor.  Work with your health care provider to maintain a healthy body weight or to lose weight. Ask what an ideal weight is for you.  Get at least 30 minutes of exercise that causes your heart to beat faster (aerobic exercise) most days of the week. Activities may include walking, swimming, or biking.  Work with your health care provider or diet and nutrition specialist (dietitian) to adjust your eating plan to your individual calorie needs. Reading food labels   Check food labels for the amount of sodium per serving. Choose foods with less than 5 percent of the Daily Value of sodium. Generally, foods with less than 300 mg of sodium per serving fit into this eating plan.  To find whole grains, look for the word "whole" as the first word in the ingredient list. Shopping  Buy products labeled as "low-sodium" or "no salt added."  Buy fresh foods. Avoid canned foods and premade or frozen meals. Cooking  Avoid adding salt when cooking. Use salt-free seasonings or herbs instead of table salt or sea salt. Check with your health care provider or pharmacist before using salt substitutes.  Do not fry foods. Cook foods using healthy methods such as baking, boiling, grilling, and broiling instead.  Cook with  heart-healthy oils, such as olive, canola, soybean, or sunflower oil. Meal planning  Eat a balanced diet that includes: ? 5 or more servings of fruits and vegetables each day. At each meal, try to fill half of your plate with fruits and vegetables. ? Up to 6-8 servings of whole grains each day. ? Less than 6 oz of lean meat, poultry, or fish each day. A 3-oz serving of meat is about the same size as a deck of cards. One egg equals 1 oz. ? 2 servings of low-fat dairy each day. ? A serving of nuts, seeds, or beans 5 times each week. ? Heart-healthy fats. Healthy fats called Omega-3 fatty acids are found in foods such as flaxseeds and coldwater fish, like sardines, salmon, and mackerel.  Limit how much you eat of the following: ? Canned or prepackaged foods. ? Food that is high in trans fat, such as fried foods. ? Food that is high in saturated fat, such as fatty meat. ? Sweets, desserts, sugary drinks, and other foods with added sugar. ? Full-fat dairy products.  Do not salt foods before eating.  Try to eat at least 2 vegetarian meals each week.  Eat more home-cooked food and less restaurant, buffet, and fast food.  When eating at a restaurant, ask that your food be prepared with less salt or no salt, if possible. What foods are recommended? The items listed may not be a complete list. Talk with your dietitian about   what dietary choices are best for you. Grains Whole-grain or whole-wheat bread. Whole-grain or whole-wheat pasta. Brown rice. Oatmeal. Quinoa. Bulgur. Whole-grain and low-sodium cereals. Pita bread. Low-fat, low-sodium crackers. Whole-wheat flour tortillas. Vegetables Fresh or frozen vegetables (raw, steamed, roasted, or grilled). Low-sodium or reduced-sodium tomato and vegetable juice. Low-sodium or reduced-sodium tomato sauce and tomato paste. Low-sodium or reduced-sodium canned vegetables. Fruits All fresh, dried, or frozen fruit. Canned fruit in natural juice (without  added sugar). Meat and other protein foods Skinless chicken or turkey. Ground chicken or turkey. Pork with fat trimmed off. Fish and seafood. Egg whites. Dried beans, peas, or lentils. Unsalted nuts, nut butters, and seeds. Unsalted canned beans. Lean cuts of beef with fat trimmed off. Low-sodium, lean deli meat. Dairy Low-fat (1%) or fat-free (skim) milk. Fat-free, low-fat, or reduced-fat cheeses. Nonfat, low-sodium ricotta or cottage cheese. Low-fat or nonfat yogurt. Low-fat, low-sodium cheese. Fats and oils Soft margarine without trans fats. Vegetable oil. Low-fat, reduced-fat, or light mayonnaise and salad dressings (reduced-sodium). Canola, safflower, olive, soybean, and sunflower oils. Avocado. Seasoning and other foods Herbs. Spices. Seasoning mixes without salt. Unsalted popcorn and pretzels. Fat-free sweets. What foods are not recommended? The items listed may not be a complete list. Talk with your dietitian about what dietary choices are best for you. Grains Baked goods made with fat, such as croissants, muffins, or some breads. Dry pasta or rice meal packs. Vegetables Creamed or fried vegetables. Vegetables in a cheese sauce. Regular canned vegetables (not low-sodium or reduced-sodium). Regular canned tomato sauce and paste (not low-sodium or reduced-sodium). Regular tomato and vegetable juice (not low-sodium or reduced-sodium). Pickles. Olives. Fruits Canned fruit in a light or heavy syrup. Fried fruit. Fruit in cream or butter sauce. Meat and other protein foods Fatty cuts of meat. Ribs. Fried meat. Bacon. Sausage. Bologna and other processed lunch meats. Salami. Fatback. Hotdogs. Bratwurst. Salted nuts and seeds. Canned beans with added salt. Canned or smoked fish. Whole eggs or egg yolks. Chicken or turkey with skin. Dairy Whole or 2% milk, cream, and half-and-half. Whole or full-fat cream cheese. Whole-fat or sweetened yogurt. Full-fat cheese. Nondairy creamers. Whipped toppings.  Processed cheese and cheese spreads. Fats and oils Butter. Stick margarine. Lard. Shortening. Ghee. Bacon fat. Tropical oils, such as coconut, palm kernel, or palm oil. Seasoning and other foods Salted popcorn and pretzels. Onion salt, garlic salt, seasoned salt, table salt, and sea salt. Worcestershire sauce. Tartar sauce. Barbecue sauce. Teriyaki sauce. Soy sauce, including reduced-sodium. Steak sauce. Canned and packaged gravies. Fish sauce. Oyster sauce. Cocktail sauce. Horseradish that you find on the shelf. Ketchup. Mustard. Meat flavorings and tenderizers. Bouillon cubes. Hot sauce and Tabasco sauce. Premade or packaged marinades. Premade or packaged taco seasonings. Relishes. Regular salad dressings. Where to find more information:  National Heart, Lung, and Blood Institute: www.nhlbi.nih.gov  American Heart Association: www.heart.org Summary  The DASH eating plan is a healthy eating plan that has been shown to reduce high blood pressure (hypertension). It may also reduce your risk for type 2 diabetes, heart disease, and stroke.  With the DASH eating plan, you should limit salt (sodium) intake to 2,300 mg a day. If you have hypertension, you may need to reduce your sodium intake to 1,500 mg a day.  When on the DASH eating plan, aim to eat more fresh fruits and vegetables, whole grains, lean proteins, low-fat dairy, and heart-healthy fats.  Work with your health care provider or diet and nutrition specialist (dietitian) to adjust your eating plan to your   individual calorie needs. This information is not intended to replace advice given to you by your health care provider. Make sure you discuss any questions you have with your health care provider. Document Revised: 07/01/2017 Document Reviewed: 07/12/2016 Elsevier Patient Education  2020 Elsevier Inc.  

## 2020-06-13 ENCOUNTER — Encounter: Payer: Self-pay | Admitting: Nurse Practitioner

## 2020-06-13 ENCOUNTER — Other Ambulatory Visit: Payer: Self-pay

## 2020-06-13 ENCOUNTER — Ambulatory Visit (INDEPENDENT_AMBULATORY_CARE_PROVIDER_SITE_OTHER): Payer: Medicare Other | Admitting: Nurse Practitioner

## 2020-06-13 ENCOUNTER — Telehealth: Payer: Self-pay

## 2020-06-13 ENCOUNTER — Other Ambulatory Visit: Payer: Self-pay | Admitting: Nurse Practitioner

## 2020-06-13 DIAGNOSIS — R413 Other amnesia: Secondary | ICD-10-CM

## 2020-06-13 DIAGNOSIS — E2839 Other primary ovarian failure: Secondary | ICD-10-CM | POA: Diagnosis not present

## 2020-06-13 DIAGNOSIS — Z0000 Encounter for general adult medical examination without abnormal findings: Secondary | ICD-10-CM | POA: Diagnosis not present

## 2020-06-13 NOTE — Patient Instructions (Signed)
[name] , Thank you for taking time to come for your Medicare Wellness Visit. I appreciate your ongoing commitment to your health goals. Please review the following plan we discussed and let me know if I can assist you in the future.   Screening recommendations/referrals: Colonoscopy up to date Mammogram up to date Bone Density DUE at this time, recommended every 2 years Recommended yearly ophthalmology/optometry visit for glaucoma screening and checkup Recommended yearly dental visit for hygiene and checkup  Vaccinations: Influenza vaccine up to date Pneumococcal vaccine up to date Tdap vaccine up to date Shingles vaccine RECOMMENDED to get shingrix     Advanced directives: recommended to complete and place on file.   Conditions/risks identified: progressive memory loss, osteoporosis, advanced age.   Next appointment: 1 year for AWV, 3 month for routine follow up   Preventive Care 65 Years and Older, Female Preventive care refers to lifestyle choices and visits with your health care provider that can promote health and wellness. What does preventive care include?  A yearly physical exam. This is also called an annual well check.  Dental exams once or twice a year.  Routine eye exams. Ask your health care provider how often you should have your eyes checked.  Personal lifestyle choices, including:  Daily care of your teeth and gums.  Regular physical activity.  Eating a healthy diet.  Avoiding tobacco and drug use.  Limiting alcohol use.  Practicing safe sex.  Taking low-dose aspirin every day.  Taking vitamin and mineral supplements as recommended by your health care provider. What happens during an annual well check? The services and screenings done by your health care provider during your annual well check will depend on your age, overall health, lifestyle risk factors, and family history of disease. Counseling  Your health care provider may ask you  questions about your:  Alcohol use.  Tobacco use.  Drug use.  Emotional well-being.  Home and relationship well-being.  Sexual activity.  Eating habits.  History of falls.  Memory and ability to understand (cognition).  Work and work Statistician.  Reproductive health. Screening  You may have the following tests or measurements:  Height, weight, and BMI.  Blood pressure.  Lipid and cholesterol levels. These may be checked every 5 years, or more frequently if you are over 36 years old.  Skin check.  Lung cancer screening. You may have this screening every year starting at age 47 if you have a 30-pack-year history of smoking and currently smoke or have quit within the past 15 years.  Fecal occult blood test (FOBT) of the stool. You may have this test every year starting at age 70.  Flexible sigmoidoscopy or colonoscopy. You may have a sigmoidoscopy every 5 years or a colonoscopy every 10 years starting at age 17.  Hepatitis C blood test.  Hepatitis B blood test.  Sexually transmitted disease (STD) testing.  Diabetes screening. This is done by checking your blood sugar (glucose) after you have not eaten for a while (fasting). You may have this done every 1-3 years.  Bone density scan. This is done to screen for osteoporosis. You may have this done starting at age 74.  Mammogram. This may be done every 1-2 years. Talk to your health care provider about how often you should have regular mammograms. Talk with your health care provider about your test results, treatment options, and if necessary, the need for more tests. Vaccines  Your health care provider may recommend certain vaccines, such as:  Influenza vaccine. This is recommended every year.  Tetanus, diphtheria, and acellular pertussis (Tdap, Td) vaccine. You may need a Td booster every 10 years.  Zoster vaccine. You may need this after age 12.  Pneumococcal 13-valent conjugate (PCV13) vaccine. One dose is  recommended after age 51.  Pneumococcal polysaccharide (PPSV23) vaccine. One dose is recommended after age 51. Talk to your health care provider about which screenings and vaccines you need and how often you need them. This information is not intended to replace advice given to you by your health care provider. Make sure you discuss any questions you have with your health care provider. Document Released: 08/15/2015 Document Revised: 04/07/2016 Document Reviewed: 05/20/2015 Elsevier Interactive Patient Education  2017 [place] Prevention in the Home Falls can cause injuries. They can happen to people of all ages. There are many things you can do to make your home safe and to help prevent falls. What can I do on the outside of my home?  Regularly fix the edges of walkways and driveways and fix any cracks.  Remove anything that might make you trip as you walk through a door, such as a raised step or threshold.  Trim any bushes or trees on the path to your home.  Use bright outdoor lighting.  Clear any walking paths of anything that might make someone trip, such as rocks or tools.  Regularly check to see if handrails are loose or broken. Make sure that both sides of any steps have handrails.  Any raised decks and porches should have guardrails on the edges.  Have any leaves, snow, or ice cleared regularly.  Use sand or salt on walking paths during winter.  Clean up any spills in your garage right away. This includes oil or grease spills. What can I do in the bathroom?  Use night lights.  Install grab bars by the toilet and in the tub and shower. Do not use towel bars as grab bars.  Use non-skid mats or decals in the tub or shower.  If you need to sit down in the shower, use a plastic, non-slip stool.  Keep the floor dry. Clean up any water that spills on the floor as soon as it happens.  Remove soap buildup in the tub or shower regularly.  Attach bath mats  securely with double-sided non-slip rug tape.  Do not have throw rugs and other things on the floor that can make you trip. What can I do in the bedroom?  Use night lights.  Make sure that you have a light by your bed that is easy to reach.  Do not use any sheets or blankets that are too big for your bed. They should not hang down onto the floor.  Have a firm chair that has side arms. You can use this for support while you get dressed.  Do not have throw rugs and other things on the floor that can make you trip. What can I do in the kitchen?  Clean up any spills right away.  Avoid walking on wet floors.  Keep items that you use a lot in easy-to-reach places.  If you need to reach something above you, use a strong step stool that has a grab bar.  Keep electrical cords out of the way.  Do not use floor polish or wax that makes floors slippery. If you must use wax, use non-skid floor wax.  Do not have throw rugs and other things on the floor that  can make you trip. What can I do with my stairs?  Do not leave any items on the stairs.  Make sure that there are handrails on both sides of the stairs and use them. Fix handrails that are broken or loose. Make sure that handrails are as long as the stairways.  Check any carpeting to make sure that it is firmly attached to the stairs. Fix any carpet that is loose or worn.  Avoid having throw rugs at the top or bottom of the stairs. If you do have throw rugs, attach them to the floor with carpet tape.  Make sure that you have a light switch at the top of the stairs and the bottom of the stairs. If you do not have them, ask someone to add them for you. What else can I do to help prevent falls?  Wear shoes that:  Do not have high heels.  Have rubber bottoms.  Are comfortable and fit you well.  Are closed at the toe. Do not wear sandals.  If you use a stepladder:  Make sure that it is fully opened. Do not climb a closed  stepladder.  Make sure that both sides of the stepladder are locked into place.  Ask someone to hold it for you, if possible.  Clearly mark and make sure that you can see:  Any grab bars or handrails.  First and last steps.  Where the edge of each step is.  Use tools that help you move around (mobility aids) if they are needed. These include:  Canes.  Walkers.  Scooters.  Crutches.  Turn on the lights when you go into a dark area. Replace any light bulbs as soon as they burn out.  Set up your furniture so you have a clear path. Avoid moving your furniture around.  If any of your floors are uneven, fix them.  If there are any pets around you, be aware of where they are.  Review your medicines with your doctor. Some medicines can make you feel dizzy. This can increase your chance of falling. Ask your doctor what other things that you can do to help prevent falls. This information is not intended to replace advice given to you by your health care provider. Make sure you discuss any questions you have with your health care provider. Document Released: 05/15/2009 Document Revised: 12/25/2015 Document Reviewed: 08/23/2014 Elsevier Interactive Patient Education  2017 [employer].

## 2020-06-13 NOTE — Progress Notes (Signed)
This service is provided via telemedicine  No vital signs collected/recorded due to the encounter was a telemedicine visit.   Location of patient (ex: home, work):  Home  Patient consents to a telephone visit:  Yes, see encounter dated 06/13/2020  Location of the provider (ex: office, home):  [facility]  Name of any referring provider:  N/A  Names of all persons participating in the telemedicine service and their role in the encounter: Sherrie Mustache, Nurse Practitioner, Carroll Kinds, CMA, and patient.   Time spent on call:  15 minutes with medical assistant

## 2020-06-13 NOTE — Telephone Encounter (Signed)
[name], Caroline Snyder are scheduled for a virtual visit with your provider today.    Just as we do with appointments in the office, we must obtain your consent to participate.  Your consent will be active for this visit and any virtual visit you may have with one of our providers in the next 365 days.    If you have a MyChart account, I can also send a copy of this consent to you electronically.  All virtual visits are billed to your insurance company just like a traditional visit in the office.  As this is a virtual visit, video technology does not allow for your provider to perform a traditional examination.  This may limit your provider's ability to fully assess your condition.  If your provider identifies any concerns that need to be evaluated in person or the need to arrange testing such as labs, EKG, etc, we will make arrangements to do so.    Although advances in technology are sophisticated, we cannot ensure that it will always work on either your end or our end.  If the connection with a video visit is poor, we may have to switch to a telephone visit.  With either a video or telephone visit, we are not always able to ensure that we have a secure connection.   I need to obtain your verbal consent now.   Are you willing to proceed with your visit today?   Bartolo Darter Caroline Snyder has provided verbal consent on 06/13/2020 for a virtual visit (video or telephone).   Carroll Kinds, CMA 06/13/2020  9:15 AM
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
## 2020-06-21 ENCOUNTER — Other Ambulatory Visit: Payer: Self-pay | Admitting: Orthopedic Surgery

## 2020-06-21 DIAGNOSIS — I10 Essential (primary) hypertension: Secondary | ICD-10-CM

## 2020-06-30 LAB — HM MAMMOGRAPHY

## 2020-07-04 ENCOUNTER — Telehealth: Payer: Self-pay

## 2020-07-04 NOTE — Telephone Encounter (Signed)
I called patient to inform her that mammogram completed on 06/30/2020 was normal and recommend repeat in one yer (06/23/2021). Patient verbalized understanding.  Patient also mentioned that over the last couple of days she has noticed a sore area on her left breast that is concerning. Appointment scheduled for Monday to evaluate.

## 2020-07-05 ENCOUNTER — Other Ambulatory Visit: Payer: Self-pay | Admitting: Nurse Practitioner

## 2020-07-05 DIAGNOSIS — R413 Other amnesia: Secondary | ICD-10-CM

## 2020-07-07 ENCOUNTER — Ambulatory Visit: Payer: Self-pay | Admitting: Nurse Practitioner

## 2020-07-07 NOTE — Telephone Encounter (Signed)
Patient has an appointment today. We will need to discuss. Per [name], NP last OV notes patient was to increase to higher dose after 3 weeks.   I informed Janett Billow about this verbally and made a notation to appointment notes for today.

## 2020-07-20 ENCOUNTER — Other Ambulatory Visit: Payer: Self-pay | Admitting: Nurse Practitioner

## 2020-07-20 DIAGNOSIS — I10 Essential (primary) hypertension: Secondary | ICD-10-CM

## 2020-07-21 DIAGNOSIS — S6991XA Unspecified injury of right wrist, hand and finger(s), initial encounter: Secondary | ICD-10-CM | POA: Diagnosis not present

## 2020-07-21 DIAGNOSIS — S62524A Nondisplaced fracture of distal phalanx of right thumb, initial encounter for closed fracture: Secondary | ICD-10-CM | POA: Diagnosis not present

## 2020-07-22 DIAGNOSIS — S62524A Nondisplaced fracture of distal phalanx of right thumb, initial encounter for closed fracture: Secondary | ICD-10-CM

## 2020-07-22 HISTORY — DX: Nondisplaced fracture of distal phalanx of right thumb, initial encounter for closed fracture: S62.524A

## 2020-08-06 DIAGNOSIS — S62524A Nondisplaced fracture of distal phalanx of right thumb, initial encounter for closed fracture: Secondary | ICD-10-CM | POA: Diagnosis not present

## 2020-08-13 ENCOUNTER — Encounter: Payer: Medicare Other | Admitting: Obstetrics & Gynecology

## 2020-08-15 DIAGNOSIS — H353122 Nonexudative age-related macular degeneration, left eye, intermediate dry stage: Secondary | ICD-10-CM | POA: Diagnosis not present

## 2020-08-15 DIAGNOSIS — H04123 Dry eye syndrome of bilateral lacrimal glands: Secondary | ICD-10-CM | POA: Diagnosis not present

## 2020-08-15 DIAGNOSIS — Z961 Presence of intraocular lens: Secondary | ICD-10-CM | POA: Diagnosis not present

## 2020-08-15 DIAGNOSIS — H353111 Nonexudative age-related macular degeneration, right eye, early dry stage: Secondary | ICD-10-CM | POA: Diagnosis not present

## 2020-08-27 DIAGNOSIS — S62524D Nondisplaced fracture of distal phalanx of right thumb, subsequent encounter for fracture with routine healing: Secondary | ICD-10-CM | POA: Diagnosis not present

## 2020-09-17 ENCOUNTER — Ambulatory Visit (INDEPENDENT_AMBULATORY_CARE_PROVIDER_SITE_OTHER): Payer: Medicare Other | Admitting: Nurse Practitioner

## 2020-09-17 ENCOUNTER — Other Ambulatory Visit: Payer: Self-pay

## 2020-09-17 ENCOUNTER — Encounter: Payer: Self-pay | Admitting: Nurse Practitioner

## 2020-09-17 ENCOUNTER — Other Ambulatory Visit: Payer: Self-pay | Admitting: Nurse Practitioner

## 2020-09-17 VITALS — BP 150/82 | HR 64 | Temp 96.8°F | Ht 62.0 in | Wt 105.0 lb

## 2020-09-17 DIAGNOSIS — M810 Age-related osteoporosis without current pathological fracture: Secondary | ICD-10-CM

## 2020-09-17 DIAGNOSIS — R519 Headache, unspecified: Secondary | ICD-10-CM

## 2020-09-17 DIAGNOSIS — I10 Essential (primary) hypertension: Secondary | ICD-10-CM | POA: Diagnosis not present

## 2020-09-17 DIAGNOSIS — R413 Other amnesia: Secondary | ICD-10-CM

## 2020-09-17 DIAGNOSIS — E782 Mixed hyperlipidemia: Secondary | ICD-10-CM | POA: Diagnosis not present

## 2020-09-17 DIAGNOSIS — K219 Gastro-esophageal reflux disease without esophagitis: Secondary | ICD-10-CM

## 2020-09-17 MED ORDER — NEBIVOLOL HCL 5 MG PO TABS: 5.0000 mg | ORAL_TABLET | Freq: Every day | ORAL | 1 refills | Status: DC

## 2020-09-17 NOTE — Progress Notes (Signed)
Careteam: Patient Care Team: [name], NP as PCP - General (Geriatric Medicine) [name], MD as Consulting Physician (Ophthalmology) Calvert Cantor, MD as Consulting Physician (Ophthalmology) Terrance Mass, MD (Inactive) as Consulting Physician (Gynecology)  PLACE OF SERVICE:  [place] Directive information Does Patient Have a Medical Advance Directive?: No, Would patient like information on creating a medical advance directive?: No - Patient declined  Allergies  Allergen Reactions  . Vioxx [Rofecoxib] Nausea Only    Pt. Can't remember what reaction was but asks to leave on her profile.     Chief Complaint  Patient presents with  . Medical Management of Chronic Issues    3 month follow-up. Discuss need for colonoscopy and covid vaccine or exclude. Patient c/o headache and nausea.     HPI: Patient is a 80 y.o. female for routine follow up.   htn- reports she has been taking blood pressure at home but did not bring the chart that she wrote it on, forgot.  She does not have pill bottles today but reports she is taking norvasc and losartan. Reports low sodium diet but then states she eats mostly deli sandwiches and soups from the can. Does not cook.   Reports she travels/drives a lot. Does a 2 hour drive pretty frequently   Headache occasionally, has not eaten much today and feels like that is where it is coming from today.   GERD- uses tums PRN   Memory loss- reports she is not taking her aricept. Just found her bottle.  Denies getting lost while driving.  Having a hard time finding words today.  Does not cook.  Lives alone.   Plans to get back into dancing soon since COVID numbers are going down.   Review of Systems:  Review of Systems  Constitutional: Negative for chills, fever and weight loss.  HENT: Negative for tinnitus.   Respiratory: Negative for cough, sputum production and shortness of breath.   Cardiovascular: Negative for  chest pain, palpitations and leg swelling.  Gastrointestinal: Positive for heartburn. Negative for abdominal pain, constipation and diarrhea.  Genitourinary: Negative for dysuria, frequency and urgency.  Musculoskeletal: Negative for back pain, falls, joint pain and myalgias.  Skin: Negative.   Neurological: Positive for headaches. Negative for dizziness.  Psychiatric/Behavioral: Positive for memory loss. Negative for depression. The patient does not have insomnia.     Past Medical History:  Diagnosis Date  . Allergy   . 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 Bunion 07/01/2010  . Depression 05/05/2007  . External hemorrhoids without mention of complication 02/63/7858  . GERD (gastroesophageal reflux disease) 2000  . Hyperlipidemia 06/29/2006  . Memory loss 04/15/2003  . Migraine without aura, without mention of intractable migraine without mention of status migrainosus 03/24/1999  . Osteoporosis, unspecified 04/15/2004  . 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 Unspecified essential hypertension 11/22/2007   Past Surgical History:  Procedure Laterality Date  . COLONOSCOPY  06-16-2007   Internal hemorrhoids,laxity of anal sphincter,polyp at 25cm form anal verge, tortuous sigmoid colon. Dr.Orr   . EYE SURGERY Bilateral 2010   cataract Dr. Bing Plume  . VAGINAL HYSTERECTOMY  1980   Social History:   reports that she has never smoked. She has never used smokeless tobacco. She reports that she does not drink alcohol and does not use drugs.  Family History  Problem Relation Age of Onset  . Alzheimer's disease Mother   . Diabetes Mother   . Transient ischemic attack Mother   .  Heart disease Mother        MI, CHF  . Alcohol abuse Father   . Kidney disease Father   . Hypertension Sister   . Hypertension Brother   . Heart disease Brother   . Hypertension Brother   . Hypertension Sister   . Hypertension Brother   . Other Son        truck accident 07/2016  . Colon cancer  Neg Hx   . Stomach cancer Neg Hx     Medications: Patient's Medications  New Prescriptions   No medications on file  Previous Medications   AMLODIPINE (NORVASC) 10 MG TABLET    TAKE 1 TABLET BY MOUTH EVERY DAY   ASCORBIC ACID (VITAMIN C) 1000 MG TABLET    Take 1,000 mg by mouth daily.   ASPIRIN 81 MG TABLET    Take 81 mg by mouth daily. Take 1 tablet daily to prevent heart attack, stroke and clotting.   CALCIUM-MAGNESIUM-VITAMIN D (CALCIUM 1200+D3 PO)    Take by mouth daily.   CHOLECALCIFEROL 50 MCG (2000 UT) CAPS    Take 1 tablet daily   CINNAMON PO    1000 mg po qd   CRANBERRY-VITAMIN C-VITAMIN E 4200-20-3 MG-MG-UNIT CAPS    Take 1 tablet by mouth daily.    DONEPEZIL (ARICEPT) 5 MG TABLET    TAKE 1 TABLET BY MOUTH EVERYDAY AT BEDTIME   LOSARTAN (COZAAR) 100 MG TABLET    TAKE 1 TABLET BY MOUTH EVERY DAY   MELATONIN 3 MG CAPS    Take by mouth as needed.   MULTIPLE VITAMIN (MULTI VITAMIN DAILY PO)    Take by mouth. Centrum Silver for Women Take 1 tablet daily   MULTIPLE VITAMINS-MINERALS (ICAPS AREDS FORMULA PO)    Take by mouth 2 (two) times daily.   ONDANSETRON (ZOFRAN) 4 MG TABLET    Take one tablet by mouth every 6-8 hours as needed   ROSUVASTATIN (CRESTOR) 20 MG TABLET    TAKE 1 TABLET BY MOUTH EVERY DAY   TRAZODONE (DESYREL) 50 MG TABLET    TAKE 1 TABLET BY MOUTH EVERYDAY AT BEDTIME  Modified Medications   No medications on file  Discontinued Medications   No medications on file    Physical Exam:  Vitals:   09/17/20 1029  BP: (!) 150/82  Pulse: 64  Temp: (!) 96.8 F (36 C)  TempSrc: Temporal  SpO2: 99%  Weight: 105 lb (47.6 kg)  Height: 5\' 2"  (1.575 m)   Body mass index is 19.2 kg/m. Wt Readings from Last 3 Encounters:  09/17/20 105 lb (47.6 kg)  05/30/20 106 lb 3.2 oz (48.2 kg)  05/21/20 107 lb (48.5 kg)    Physical Exam Constitutional:      General: She is not in acute distress.    Appearance: She is well-developed and well-nourished. She is not  diaphoretic.  HENT:     Head: Normocephalic and atraumatic.     Mouth/Throat:     Mouth: Oropharynx is clear and moist.     Pharynx: No oropharyngeal exudate.  Eyes:     Conjunctiva/sclera: Conjunctivae normal.     Pupils: Pupils are equal, round, and reactive to light.  Cardiovascular:     Rate and Rhythm: Normal rate and regular rhythm.     Heart sounds: Normal heart sounds.  Pulmonary:     Effort: Pulmonary effort is normal.     Breath sounds: Normal breath sounds.  Abdominal:     General: Bowel sounds are  normal.     Palpations: Abdomen is soft.  Musculoskeletal:        General: No tenderness or edema.     Cervical back: Normal range of motion and neck supple.  Skin:    General: Skin is warm and dry.  Neurological:     Mental Status: She is alert and oriented to person, place, and time.  Psychiatric:        Mood and Affect: Mood and affect and mood normal.        Behavior: Behavior normal.     Labs reviewed: Basic Metabolic Panel: Recent Labs    10/22/19 1047 04/21/20 0940  NA 140 140  K 4.1 5.0  CL 106 104  CO2 26 27  GLUCOSE 99 98  BUN 13 19  CREATININE 0.89 0.96*  CALCIUM 9.4 9.7  TSH 2.71 3.71   Liver Function Tests: Recent Labs    10/22/19 1047 04/21/20 0940  AST 22 20  ALT 15 12  BILITOT 0.4 0.4  PROT 6.8 7.1   No results for input(s): LIPASE, AMYLASE in the last 8760 hours. No results for input(s): AMMONIA in the last 8760 hours. CBC: Recent Labs    10/22/19 1047 04/21/20 0940  WBC 4.2 5.9  NEUTROABS 2,218 3,316  HGB 12.7 12.9  HCT 37.4 38.2  MCV 90.3 92.0  PLT 290 295   Lipid Panel: Recent Labs    10/22/19 1047 04/21/20 0940  CHOL 198 203*  HDL 73 68  LDLCALC 110* 114*  TRIG 66 105  CHOLHDL 2.7 3.0   TSH: Recent Labs    10/22/19 1047 04/21/20 0940  TSH 2.71 3.71   A1C: No results found for: HGBA1C   Assessment/Plan 1. Memory loss Ongoing memory loss, reports she is not taking her Aricept. Recently found the  bottle. Encouraged her to restart  2. Essential hypertension Not at goal. Forgot blood pressure log. Continue on amlodipine and losartan. Educated about low sodium diet.  Will also add bystolic - nebivolol (BYSTOLIC) 5 MG tablet; Take 1 tablet (5 mg total) by mouth daily.  Dispense: 30 tablet; Refill: 1 - COMPLETE METABOLIC PANEL WITH GFR - CBC with Differential/Platelet  3. Nonintractable episodic headache, unspecified headache type Stable at this time. Occasionally headaches but not uncontrolled.   4. Age-related osteoporosis without current pathological fracture Follow up bone density scheduled. Continue cal and vit d  5. Gastroesophageal reflux disease without esophagitis -stable, uses TUMs prn.   6. Mixed hyperlipidemia Continues on crestor with dietary modifications.  - Lipid panel - COMPLETE METABOLIC PANEL WITH GFR  Next appt: 4 weeks for bp check.  Carlos American. [place], [facility] [phone]

## 2020-09-17 NOTE — Patient Instructions (Signed)
Make sure you are taking your losartan and norvasc daily ADD BYSTOLIC 5 mg daily

## 2020-09-18 LAB — CBC WITH DIFFERENTIAL/PLATELET
Absolute Monocytes: 339 cells/uL (ref 200–950)
Basophils Absolute: 48 cells/uL (ref 0–200)
Basophils Relative: 0.9 %
Eosinophils Absolute: 133 cells/uL (ref 15–500)
Eosinophils Relative: 2.5 %
HCT: 37.9 % (ref 35.0–45.0)
Hemoglobin: 12.8 g/dL (ref 11.7–15.5)
Lymphs Abs: 1622 cells/uL (ref 850–3900)
MCH: 30.8 pg (ref 27.0–33.0)
MCHC: 33.8 g/dL (ref 32.0–36.0)
MCV: 91.3 fL (ref 80.0–100.0)
MPV: 10.4 fL (ref 7.5–12.5)
Monocytes Relative: 6.4 %
Neutro Abs: 3159 cells/uL (ref 1500–7800)
Neutrophils Relative %: 59.6 %
Platelets: 277 10*3/uL (ref 140–400)
RBC: 4.15 10*6/uL (ref 3.80–5.10)
RDW: 11.8 % (ref 11.0–15.0)
Total Lymphocyte: 30.6 %
WBC: 5.3 10*3/uL (ref 3.8–10.8)

## 2020-09-18 LAB — LIPID PANEL
Cholesterol: 202 mg/dL — ABNORMAL HIGH (ref <200)
HDL: 60 mg/dL (ref >=50)
LDL Cholesterol (Calc): 112 mg/dL (calc) — ABNORMAL HIGH
Non-HDL Cholesterol (Calc): 142 mg/dL (calc) — ABNORMAL HIGH (ref <130)
Total CHOL/HDL Ratio: 3.4 (calc) (ref <5.0)
Triglycerides: 178 mg/dL — ABNORMAL HIGH (ref <150)

## 2020-09-18 LAB — COMPLETE METABOLIC PANEL WITH GFR
AG Ratio: 1.8 (calc) (ref 1.0–2.5)
ALT: 25 U/L (ref 6–29)
AST: 30 U/L (ref 10–35)
Albumin: 4.4 g/dL (ref 3.6–5.1)
Alkaline phosphatase (APISO): 60 U/L (ref 37–153)
BUN: 17 mg/dL (ref 7–25)
CO2: 29 mmol/L (ref 20–32)
Calcium: 9.9 mg/dL (ref 8.6–10.4)
Chloride: 104 mmol/L (ref 98–110)
Creat: 0.82 mg/dL (ref 0.60–0.88)
GFR, Est African American: 78 mL/min/{1.73_m2} (ref >=60)
GFR, Est Non African American: 68 mL/min/{1.73_m2} (ref >=60)
Globulin: 2.5 g/dL (calc) (ref 1.9–3.7)
Glucose, Bld: 94 mg/dL (ref 65–139)
Potassium: 4.6 mmol/L (ref 3.5–5.3)
Sodium: 141 mmol/L (ref 135–146)
Total Bilirubin: 0.3 mg/dL (ref 0.2–1.2)
Total Protein: 6.9 g/dL (ref 6.1–8.1)

## 2020-10-09 ENCOUNTER — Other Ambulatory Visit: Payer: Self-pay | Admitting: Nurse Practitioner

## 2020-10-09 DIAGNOSIS — Z85828 Personal history of other malignant neoplasm of skin: Secondary | ICD-10-CM | POA: Diagnosis not present

## 2020-10-09 DIAGNOSIS — L814 Other melanin hyperpigmentation: Secondary | ICD-10-CM | POA: Diagnosis not present

## 2020-10-09 DIAGNOSIS — D1801 Hemangioma of skin and subcutaneous tissue: Secondary | ICD-10-CM | POA: Diagnosis not present

## 2020-10-09 DIAGNOSIS — D2272 Melanocytic nevi of left lower limb, including hip: Secondary | ICD-10-CM | POA: Diagnosis not present

## 2020-10-09 DIAGNOSIS — I10 Essential (primary) hypertension: Secondary | ICD-10-CM

## 2020-10-09 DIAGNOSIS — D225 Melanocytic nevi of trunk: Secondary | ICD-10-CM | POA: Diagnosis not present

## 2020-10-09 DIAGNOSIS — L820 Inflamed seborrheic keratosis: Secondary | ICD-10-CM | POA: Diagnosis not present

## 2020-10-09 DIAGNOSIS — L821 Other seborrheic keratosis: Secondary | ICD-10-CM | POA: Diagnosis not present

## 2020-10-10 ENCOUNTER — Other Ambulatory Visit: Payer: Self-pay | Admitting: Nurse Practitioner

## 2020-10-10 DIAGNOSIS — R413 Other amnesia: Secondary | ICD-10-CM

## 2020-10-15 ENCOUNTER — Other Ambulatory Visit: Payer: Medicare Other

## 2020-10-15 ENCOUNTER — Ambulatory Visit: Payer: Medicare Other | Admitting: Nurse Practitioner

## 2020-10-16 ENCOUNTER — Ambulatory Visit (INDEPENDENT_AMBULATORY_CARE_PROVIDER_SITE_OTHER): Payer: Medicare Other | Admitting: Family

## 2020-10-16 ENCOUNTER — Other Ambulatory Visit: Payer: Self-pay

## 2020-10-16 ENCOUNTER — Other Ambulatory Visit: Payer: Self-pay | Admitting: Family

## 2020-10-16 ENCOUNTER — Encounter: Payer: Self-pay | Admitting: Family

## 2020-10-16 VITALS — BP 140/70 | HR 65 | Temp 97.9°F | Resp 20 | Ht 62.0 in | Wt 105.2 lb

## 2020-10-16 DIAGNOSIS — I10 Essential (primary) hypertension: Secondary | ICD-10-CM

## 2020-10-16 DIAGNOSIS — R600 Localized edema: Secondary | ICD-10-CM

## 2020-10-16 DIAGNOSIS — F321 Major depressive disorder, single episode, moderate: Secondary | ICD-10-CM

## 2020-10-16 DIAGNOSIS — R413 Other amnesia: Secondary | ICD-10-CM | POA: Diagnosis not present

## 2020-10-16 DIAGNOSIS — E162 Hypoglycemia, unspecified: Secondary | ICD-10-CM

## 2020-10-16 LAB — BASIC METABOLIC PANEL WITH GFR
BUN: 19 mg/dL (ref 7–25)
CO2: 30 mmol/L (ref 20–32)
Calcium: 10.6 mg/dL — ABNORMAL HIGH (ref 8.6–10.4)
Chloride: 104 mmol/L (ref 98–110)
Creat: 0.77 mg/dL (ref 0.60–0.88)
GFR, Est African American: 85 mL/min/{1.73_m2} (ref >=60)
GFR, Est Non African American: 73 mL/min/{1.73_m2} (ref >=60)
Glucose, Bld: 76 mg/dL (ref 65–99)
Potassium: 4 mmol/L (ref 3.5–5.3)
Sodium: 142 mmol/L (ref 135–146)

## 2020-10-16 LAB — CBC WITH DIFFERENTIAL/PLATELET
Absolute Monocytes: 474 cells/uL (ref 200–950)
Basophils Absolute: 58 cells/uL (ref 0–200)
Basophils Relative: 0.9 %
Eosinophils Absolute: 58 cells/uL (ref 15–500)
Eosinophils Relative: 0.9 %
HCT: 37.2 % (ref 35.0–45.0)
Hemoglobin: 12.8 g/dL (ref 11.7–15.5)
Lymphs Abs: 1421 cells/uL (ref 850–3900)
MCH: 31 pg (ref 27.0–33.0)
MCHC: 34.4 g/dL (ref 32.0–36.0)
MCV: 90.1 fL (ref 80.0–100.0)
MPV: 10.7 fL (ref 7.5–12.5)
Monocytes Relative: 7.4 %
Neutro Abs: 4390 cells/uL (ref 1500–7800)
Neutrophils Relative %: 68.6 %
Platelets: 270 10*3/uL (ref 140–400)
RBC: 4.13 10*6/uL (ref 3.80–5.10)
RDW: 12.1 % (ref 11.0–15.0)
Total Lymphocyte: 22.2 %
WBC: 6.4 10*3/uL (ref 3.8–10.8)

## 2020-10-16 LAB — GLUCOSE, POCT (MANUAL RESULT ENTRY): POC Glucose: 90 mg/dl (ref 70–99)

## 2020-10-16 MED ORDER — SERTRALINE HCL 25 MG PO TABS: 25.0000 mg | ORAL_TABLET | Freq: Every day | ORAL | 0 refills | Status: DC

## 2020-10-16 MED ORDER — NEBIVOLOL HCL 10 MG PO TABS: 5.0000 mg | ORAL_TABLET | Freq: Every day | ORAL | 3 refills | Status: DC

## 2020-10-16 NOTE — Progress Notes (Signed)
Provider: Darleene Cumpian FNP-C  [name], NP  Patient Care Team: [name], NP as PCP - General (Geriatric Medicine) [name], MD as Consulting Physician (Ophthalmology) Calvert Cantor, MD as Consulting Physician (Ophthalmology) Terrance Mass, MD (Inactive) as Consulting Physician (Gynecology)  Extended Emergency Contact Information Primary Emergency Contact: Carma Lair of [place] Phone: [phone] Relation: Son Secondary Emergency Contact: [place] Phone: [phone] Relation: Other  Code Status:  Full Code  Goals of care: Advanced Directive information Advanced Directives 10/16/2020  Does Patient Have a Medical Advance Directive? No  Does patient want to make changes to medical advance directive? -  Copy of [place] in Chart? -  Would patient like information on creating a medical advance directive? -  Pre-existing out of facility DNR order (yellow form or pink MOST form) -     Chief Complaint  Patient presents with  . Acute Visit    Blood sugars have been running low. Patient says she has been retaining fluid in her ankles and feet     HPI:  Pt is a 80 y.o. female seen today for an acute visit for evaluation of low blood sugars.states her sister checked her blood sugars yesterday was 71 sister gave her a bagel with cream cheese and coffee.she complained of freezing and stomach hurting.Felt better but 3 hrs later did feel good sister gave her bread plus peanut with natural berry juice blood sugar was rechecked was 201.  Has had memory issues.Not sure whether she is taking her Aricept.she lives alone.states does not cook for herself tends to eat out or eat frozen dinners.  Has had loose stool for a couple of weeks and stomach feels bad " Irritated.Feels nauseous but no vomiting.skips meals at times.Eats when ever she feels like eating.Sister is concerned due to history of colon cancer in 3  close relatives.   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  Her sister noticed her left ankle is also swollen.No pain but swelling worst when she stands on it.states both legs swell but left ankle is worst. She denies nay cough or shortness of breath.     Past Medical History:  Diagnosis Date  . Allergy   . 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 Bunion 07/01/2010  . Depression 05/05/2007  . External hemorrhoids without mention of complication 80/99/8338  . GERD (gastroesophageal reflux disease) 2000  . Hyperlipidemia 06/29/2006  . Memory loss 04/15/2003  . Migraine without aura, without mention of intractable migraine without mention of status migrainosus 03/24/1999  . Osteoporosis, unspecified 04/15/2004  . 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 Unspecified essential hypertension 11/22/2007   Past Surgical History:  Procedure Laterality Date  . COLONOSCOPY  06-16-2007   Internal hemorrhoids,laxity of anal sphincter,polyp at 25cm form anal verge, tortuous sigmoid colon. Dr.Orr   . EYE SURGERY Bilateral 2010   cataract Dr. Bing Plume  . VAGINAL HYSTERECTOMY  1980    Allergies  Allergen Reactions  . Vioxx [Rofecoxib] Nausea Only    Pt. Can't remember what reaction was but asks to leave on her profile.     Outpatient Encounter Medications as of 10/16/2020  Medication Sig  . Ascorbic Acid (VITAMIN C) 1000 MG tablet Take 1,000 mg by mouth daily.  Marland Kitchen aspirin 81 MG tablet Take 81 mg by mouth daily. Take 1 tablet daily to prevent heart attack, stroke and clotting.  . Calcium-Magnesium-Vitamin D (CALCIUM 1200+D3 PO) Take by mouth daily.  . Cholecalciferol 50 MCG (2000 UT) CAPS  Take 1 tablet daily  . CINNAMON PO 1000 mg po qd  . donepezil (ARICEPT) 5 MG tablet TAKE 1 TABLET BY MOUTH EVERYDAY AT BEDTIME  . losartan (COZAAR) 100 MG tablet TAKE 1 TABLET BY MOUTH EVERY DAY  . Melatonin 3 MG CAPS Take by mouth as needed.  . Multiple Vitamin (MULTI VITAMIN DAILY PO) Take by mouth. Centrum Silver for Women Take 1 tablet daily  . Multiple Vitamins-Minerals (ICAPS AREDS FORMULA PO) Take by mouth 2 (two) times daily.  . rosuvastatin (CRESTOR) 20 MG tablet TAKE 1 TABLET BY MOUTH EVERY DAY  . sertraline (ZOLOFT) 25 MG tablet Take 1 tablet (25 mg total) by mouth at bedtime.  . traZODone (DESYREL) 50 MG tablet TAKE 1 TABLET BY MOUTH EVERYDAY AT BEDTIME  . [DISCONTINUED] amLODipine (NORVASC) 10 MG tablet TAKE 1 TABLET BY MOUTH EVERY DAY  . [DISCONTINUED] nebivolol (BYSTOLIC) 5 MG tablet Take 1 tablet (5 mg total) by mouth daily.  . nebivolol (BYSTOLIC) 10 MG tablet Take 0.5 tablets (5 mg total) by mouth daily.   No facility-administered encounter medications on file as of 10/16/2020.    Review of Systems  Constitutional: Negative for appetite change, chills, fatigue and fever.  HENT: Negative for congestion, rhinorrhea, sinus pressure, sinus pain, sneezing and sore throat.   Respiratory: Negative for cough, chest tightness, shortness of breath and wheezing.   Cardiovascular: Negative for chest pain, palpitations and leg swelling.     Gastrointestinal: Positive for diarrhea and nausea. Negative for abdominal distention, abdominal pain, blood in stool, constipation and vomiting.       Had loose stool three times today " Little squats"   Endocrine: Negative for cold intolerance, heat intolerance, polydipsia, polyphagia and polyuria.  Musculoskeletal: Negative for arthralgias and gait problem.  Skin: Negative for color change, pallor and rash.  Neurological: Negative for dizziness, speech difficulty, weakness, light-headedness and headaches.  Hematological: Does not bruise/bleed  easily.  Psychiatric/Behavioral: Negative for agitation and behavioral problems. The patient is not nervous/anxious.        Difficulty finding words and cannot remember     Immunization History  Administered Date(s) Administered  . Fluad Quad(high Dose 65+) 04/26/2019, 04/21/2020  . Influenza, High Dose Seasonal PF 03/11/2017, 06/09/2018  . Influenza,inj,Quad PF,6+ Mos 07/19/2013, 05/28/2016, 06/02/2017  . Influenza-Unspecified 05/23/2015  . PFIZER(Purple Top)SARS-COV-2 Vaccination 08/22/2019, 09/12/2019, 05/29/2020  . PPD Test 11/30/2013, 05/30/2015  . Pneumococcal Conjugate-13 12/24/2014, 05/28/2016  . Pneumococcal Polysaccharide-23 06/09/2018  . Tdap 03/11/2017, 11/17/2018  . Zoster 11/19/2009   Pertinent  Health Maintenance Due  Topic Date Due  . MAMMOGRAM  06/30/2021  . COLONOSCOPY (Pts 45-51yrs Insurance coverage will need to be confirmed)  05/13/2022  . INFLUENZA VACCINE  Completed  . DEXA SCAN  Completed  . PNA vac Low Risk Adult  Completed   Fall Risk  10/16/2020 09/17/2020 06/13/2020 04/25/2020 10/25/2019  Falls in the past year? 0 0 0 0 0  Number falls in past yr: 0 0 0 0 0  Injury with Fall? 0 0 0 0 0   Functional Status Survey:    Vitals:   10/16/20 1427  BP: 140/70  Pulse: 65  Resp: 20  Temp: 97.9 F (36.6 C)  TempSrc: Temporal  SpO2: 98%  Weight: 105 lb 3.2 oz (47.7 kg)  Height: $Remove'5\' 2"'nHedpsi$  (1.575 m)   Body mass index is 19.24 kg/m.   Physical Exam Vitals reviewed.  Constitutional:      General: She is not in acute distress.    Appearance: She is normal weight. She is not ill-appearing.  HENT:     Head: Normocephalic.  Eyes:     General: No scleral icterus.       Right eye: No discharge.        Left eye: No discharge.     Extraocular Movements: Extraocular movements intact.     Conjunctiva/sclera: Conjunctivae normal.     Pupils: Pupils are equal, round, and reactive to light.  Neck:     Vascular: No carotid bruit.  Cardiovascular:     Rate and  Rhythm: Normal rate and regular rhythm.     Pulses: Normal pulses.     Heart sounds: Normal heart sounds. No murmur heard. No friction rub. No gallop.   Pulmonary:     Effort: Pulmonary effort is normal. No respiratory distress.     Breath sounds: Normal breath sounds. No wheezing, rhonchi or rales.  Chest:     Chest wall: No tenderness.  Abdominal:     General: Bowel sounds are normal. There is no distension.     Palpations: Abdomen is soft. There is no mass.     Tenderness: There is no abdominal tenderness. There is no right CVA tenderness, left CVA tenderness, guarding or rebound.  Musculoskeletal:        General: No swelling or tenderness. Normal range of motion.     Cervical back: Normal range of motion. No rigidity or tenderness.     Right lower leg: Edema present.     Left lower leg: Edema present.  Lymphadenopathy:     Cervical: No cervical adenopathy.  Skin:    General: Skin is warm and dry.     Coloration: Skin is not pale.     Findings: No bruising, erythema or rash.  Neurological:     Mental Status: She is oriented to person, place, and time.     Cranial Nerves: No cranial nerve deficit.     Motor: No weakness.     Gait: Gait normal.  Psychiatric:        Mood and Affect: Mood is depressed. Mood is not anxious. Affect is tearful.        Behavior: Behavior normal.        Thought Content: Thought content normal.        Judgment: Judgment normal.     Labs reviewed: Recent Labs    10/22/19 1047 04/21/20 0940 09/17/20 1109  NA 140 140 141  K 4.1 5.0 4.6  CL 106 104 104  CO2 $Re'26 27 29  'hIW$ GLUCOSE 99 98 94  BUN $Re'13 19 17  'iva$ CREATININE 0.89 0.96* 0.82  CALCIUM 9.4 9.7 9.9   Recent Labs    10/22/19 1047 04/21/20 0940 09/17/20 1109  AST $Re'22 20 30  'chx$ ALT $R'15 12 25  'rQ$ BILITOT 0.4 0.4 0.3  PROT 6.8 7.1 6.9   Recent Labs    10/22/19 1047 04/21/20 0940 09/17/20 1109  WBC 4.2 5.9 5.3  NEUTROABS 2,218 3,316 3,159  HGB 12.7 12.9 12.8  HCT 37.4 38.2 37.9  MCV 90.3  92.0 91.3  PLT 290 295 277   Lab Results  Component Value Date  TSH 3.71 04/21/2020   No results found for: HGBA1C Lab Results  Component Value Date   CHOL 202 (H) 09/17/2020   HDL 60 09/17/2020   LDLCALC 112 (H) 09/17/2020   TRIG 178 (H) 09/17/2020   CHOLHDL 3.4 09/17/2020    Significant Diagnostic Results in last 30 days:  No results found.  Assessment/Plan 1. Low blood sugar Reports CBG checked by sister was 63 at home but improved after eating.tends to skip meals at times.CBG reading was 90 here today.gluocose done 09/17/2020 was 94  - Advised to eat meals on regular time and avoid skipping meals.  - weight stable.  Will check lab work to rule out other infectious or metabolic etiologies.   - CBC with Differential/Platelet - BMP with eGFR([facility]) - POC Glucose (CBG)  2. Memory loss Ongoing still lives alone and does own ADL's. - has sisters for family support  - CBC with Differential/Platelet - BMP with eGFR(Quest)  3. Edema of all four extremities Bilateral ankle swelling suspect possible side effects from Amlodipine.Advised to discontinue Amlodipine 10 mg tablet  - will follow up in 2 weeks to re-evaluate edema.  - CBC with Differential/Platelet - BMP with eGFR(Quest)  4. 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  Advised to start on sertraline.side effects discussed.Will follow up for evaluation.  - sertraline (ZOLOFT) 25 MG tablet; Take 1 tablet (25 mg total) by mouth at bedtime.  Dispense: 30 tablet; Refill: 0  5. Essential hypertension B/p stable.bilateral ankle swelling possible side effects from amlodipine.will d/c amlodipine  Advised to increase Bystolic from 5 mg tablet to 10 mg tablet daily - Advised to monitor B/p and HR  at home and record on provided log today then bring log in 2 weeks for evaluation   - CBC with Differential/Platelet - BMP with eGFR([facility]) - nebivolol (BYSTOLIC) 10 MG tablet; Take 0.5 tablets (5 mg total) by mouth daily.  Dispense: 30 tablet; Refill: 3  Family/ staff Communication: Reviewed plan of care with patient and sister Hoyle Sauer.  Labs/tests ordered:  - CBC with Differential/Platelet - BMP with eGFR([facility])  Next Appointment: 2 weeks for follow up leg swelling,depression and blood pressure.   Sandrea Hughs, NP

## 2020-10-16 NOTE — Patient Instructions (Signed)
-   discontinue Amlodipine 10 mg tablet due to swelling on the ankles   - Increase Bystolic from 5 mg tablet to 10 mg tablet one by mouth daily   - Start on Sertraline 25 mg tablet one by mouth daily at bedtime for depression.  - check blood pressure and Heart rate and record then bring log to visit in 2 weeks. Notify provider if blood pressure is > 140/90   - blood sugar done today was 90 please eat your meals on a regular time and avoid skipping meals.

## 2020-10-17 ENCOUNTER — Other Ambulatory Visit: Payer: Self-pay | Admitting: Family

## 2020-10-17 DIAGNOSIS — I10 Essential (primary) hypertension: Secondary | ICD-10-CM

## 2020-10-17 NOTE — Telephone Encounter (Signed)
Pharmacy has sent medication back stating that prescription alternative is not covered by insurance. Medication pend and sent to PCP Dewaine Oats Carlos American, NP for review. Please Advise.

## 2020-10-22 ENCOUNTER — Other Ambulatory Visit: Payer: Self-pay

## 2020-10-22 ENCOUNTER — Encounter: Payer: Self-pay | Admitting: Nurse Practitioner

## 2020-10-22 ENCOUNTER — Ambulatory Visit (INDEPENDENT_AMBULATORY_CARE_PROVIDER_SITE_OTHER): Payer: Medicare Other | Admitting: Nurse Practitioner

## 2020-10-22 VITALS — BP 130/68 | HR 63 | Temp 97.3°F | Ht 62.0 in | Wt 104.8 lb

## 2020-10-22 DIAGNOSIS — F0150 Vascular dementia without behavioral disturbance: Secondary | ICD-10-CM | POA: Diagnosis not present

## 2020-10-22 DIAGNOSIS — I10 Essential (primary) hypertension: Secondary | ICD-10-CM

## 2020-10-22 DIAGNOSIS — R600 Localized edema: Secondary | ICD-10-CM

## 2020-10-22 DIAGNOSIS — F339 Major depressive disorder, recurrent, unspecified: Secondary | ICD-10-CM | POA: Diagnosis not present

## 2020-10-22 NOTE — Progress Notes (Signed)
Careteam: Patient Care Team: [name], NP as PCP - General (Geriatric Medicine) [name], MD as Consulting Physician (Ophthalmology) Calvert Cantor, MD as Consulting Physician (Ophthalmology) Terrance Mass, MD (Inactive) as Consulting Physician (Gynecology)  PLACE OF SERVICE:  [facility]  Advanced Directive information    Allergies  Allergen Reactions  . Vioxx [Rofecoxib] Nausea Only    Pt. Can't remember what reaction was but asks to leave on her profile.     Chief Complaint  Patient presents with  . Follow-up    Follow up on blood pressure. Patient did not bring record of blood pressure readings. Patient complains of stomach issues. Patient states her stomach aches and has diarrhea for about a couple of weeks. She has been taking Pepto Bismol. Patient said she had low blood sugar a couple of weeks ago. She was visiting her sisters, they gave her orange jusice and it cam up to 201. Would like to discuss.    HPI: Patient is a 80 y.o. female for follow up on blood pressure.   htn- bystolic was increase at last visit, bp controlled today  Le edema- swelling has improved since being off norvasc. Still has some worsening swelling in the evening.  Dementia- does not remember what she takes at night.   Had episode of hypoglycemia blood sugar improved with eating. She is not consistently eating 3 meals a day.      Review of Systems:  Review of Systems  Constitutional: Negative for chills, fever and weight loss.  HENT: Negative for tinnitus.   Respiratory: Negative for cough, sputum production and shortness of breath.   Cardiovascular: Negative for chest pain, palpitations and leg swelling.  Gastrointestinal: Negative for abdominal pain, constipation, diarrhea and heartburn.  Genitourinary: Negative for dysuria, frequency and urgency.  Musculoskeletal: Negative for back pain, falls, joint pain and myalgias.  Skin: Negative.   Neurological: Negative for  dizziness and headaches.  Psychiatric/Behavioral: Positive for memory loss. Negative for depression. The patient does not have insomnia.    Past Medical History:  Diagnosis Date  . Allergy   . 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 Bunion 07/01/2010  . Depression 05/05/2007  . External hemorrhoids without mention of complication 37/34/2876  . GERD (gastroesophageal reflux disease) 2000  . Hyperlipidemia 06/29/2006  . Memory loss 04/15/2003  . Migraine without aura, without mention of intractable migraine without mention of status migrainosus 03/24/1999  . Osteoporosis, unspecified 04/15/2004  . 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 Unspecified essential hypertension 11/22/2007   Past Surgical History:  Procedure Laterality Date  . COLONOSCOPY  06-16-2007   Internal hemorrhoids,laxity of anal sphincter,polyp at 25cm form anal verge, tortuous sigmoid colon. Dr.Orr   . EYE SURGERY Bilateral 2010   cataract Dr. Bing Plume  . VAGINAL HYSTERECTOMY  1980   Social History:   reports that she has never smoked. She has never used smokeless tobacco. She reports that she does not drink alcohol and does not use drugs.  Family History  Problem Relation Age of Onset  . Alzheimer's disease Mother   . Diabetes Mother   . Transient ischemic attack Mother   . Heart disease Mother        MI, CHF  . Alcohol abuse Father   . Kidney disease Father   . Hypertension Sister   . Hypertension Brother   . Heart disease Brother   . Hypertension Brother   . Hypertension Sister   . Hypertension Brother   . Other  Son        truck accident 07/2016  . Colon cancer Neg Hx   . Stomach cancer Neg Hx     Medications: Patient's Medications  New Prescriptions   No medications on file  Previous Medications   ASCORBIC ACID (VITAMIN C) 1000 MG TABLET    Take 1,000 mg by mouth daily.   ASPIRIN 81 MG TABLET    Take 81 mg by mouth daily. Take 1 tablet daily to prevent heart attack, stroke and  clotting.   CALCIUM-MAGNESIUM-VITAMIN D (CALCIUM 1200+D3 PO)    Take by mouth daily.   CHOLECALCIFEROL 50 MCG (2000 UT) CAPS    Take 1 tablet daily   CINNAMON PO    1000 mg po qd   DONEPEZIL (ARICEPT) 5 MG TABLET    TAKE 1 TABLET BY MOUTH EVERYDAY AT BEDTIME   LOSARTAN (COZAAR) 100 MG TABLET    TAKE 1 TABLET BY MOUTH EVERY DAY   MULTIPLE VITAMIN (MULTI VITAMIN DAILY PO)    Take by mouth. Centrum Silver for Women Take 1 tablet daily   MULTIPLE VITAMINS-MINERALS (ICAPS AREDS FORMULA PO)    Take by mouth 2 (two) times daily.   NEBIVOLOL (BYSTOLIC) 10 MG TABLET    Take 1 tablet (10 mg total) by mouth daily.   ROSUVASTATIN (CRESTOR) 20 MG TABLET    TAKE 1 TABLET BY MOUTH EVERY DAY   SERTRALINE (ZOLOFT) 25 MG TABLET    Take 1 tablet (25 mg total) by mouth at bedtime.   TRAZODONE (DESYREL) 50 MG TABLET    TAKE 1 TABLET BY MOUTH EVERYDAY AT BEDTIME  Modified Medications   No medications on file  Discontinued Medications   MELATONIN 3 MG CAPS    Take by mouth as needed.    Physical Exam:  Vitals:   10/22/20 1434  BP: 130/68  Pulse: 63  Temp: (!) 97.3 F (36.3 C)  TempSrc: Temporal  SpO2: 98%  Weight: 104 lb 12.8 oz (47.5 kg)  Height: 5\' 2"  (1.575 m)   Body mass index is 19.17 kg/m. Wt Readings from Last 3 Encounters:  10/22/20 104 lb 12.8 oz (47.5 kg)  10/16/20 105 lb 3.2 oz (47.7 kg)  09/17/20 105 lb (47.6 kg)    Physical Exam Constitutional:      General: She is not in acute distress.    Appearance: She is well-developed. She is not diaphoretic.  HENT:     Head: Normocephalic and atraumatic.     Mouth/Throat:     Pharynx: No oropharyngeal exudate.  Eyes:     Conjunctiva/sclera: Conjunctivae normal.     Pupils: Pupils are equal, round, and reactive to light.  Cardiovascular:     Rate and Rhythm: Normal rate and regular rhythm.     Heart sounds: Normal heart sounds.  Pulmonary:     Effort: Pulmonary effort is normal.     Breath sounds: Normal breath sounds.  Abdominal:      General: Bowel sounds are normal.     Palpations: Abdomen is soft.  Musculoskeletal:        General: No tenderness.     Cervical back: Normal range of motion and neck supple.  Skin:    General: Skin is warm and dry.  Neurological:     Mental Status: She is alert.     Gait: Gait normal.     Comments: Forgetful, repeating herself often during visit      Labs reviewed: Basic Metabolic Panel: Recent Labs  04/21/20 0940 09/17/20 1109 10/16/20 1551  NA 140 141 142  K 5.0 4.6 4.0  CL 104 104 104  CO2 27 29 30   GLUCOSE 98 94 76  BUN 19 17 19   CREATININE 0.96* 0.82 0.77  CALCIUM 9.7 9.9 10.6*  TSH 3.71  --   --    Liver Function Tests: Recent Labs    04/21/20 0940 09/17/20 1109  AST 20 30  ALT 12 25  BILITOT 0.4 0.3  PROT 7.1 6.9   No results for input(s): LIPASE, AMYLASE in the last 8760 hours. No results for input(s): AMMONIA in the last 8760 hours. CBC: Recent Labs    04/21/20 0940 09/17/20 1109 10/16/20 1551  WBC 5.9 5.3 6.4  NEUTROABS 3,316 3,159 4,390  HGB 12.9 12.8 12.8  HCT 38.2 37.9 37.2  MCV 92.0 91.3 90.1  PLT 295 277 270   Lipid Panel: Recent Labs    04/21/20 0940 09/17/20 1109  CHOL 203* 202*  HDL 68 60  LDLCALC 114* 112*  TRIG 105 178*  CHOLHDL 3.0 3.4   TSH: Recent Labs    04/21/20 0940  TSH 3.71   A1C: No results found for: HGBA1C   Assessment/Plan 1. Essential hypertension -blood pressure well controlled at this time however she is not sure if she increase the bystolic after last visit to 10 or if she stopped amlodipine as directed. Educated to bring medication bottles with her to follow up next week.   2. Depression, recurrent ([place]) -she was started on sertraline 25 mg daily, not sure if she is taking.   3. Vascular dementia without behavioral disturbance ([place]) -progressive memory loss, pt is unsure if she is taking aricept. She is confused over medication that she is taking. Encouraged her to bring medication with  her to next appt with her daughter in law who is helping her at this time.  4. Lower extremity edema Improved. She is not sure that she stopped norvasc after last visit with Dinah, encouraged to keep LE elevated, low sodium diet.  Next appt: 10/31/2020 as scheduled Kavian Peters K. [place], [facility] [phone]

## 2020-10-22 NOTE — Patient Instructions (Signed)
Continue medication as prescribed  Please bring all medication that you are taking to next appt and have your daughter in law come with you to help.  Keep follow up as scheduled

## 2020-10-31 ENCOUNTER — Ambulatory Visit (INDEPENDENT_AMBULATORY_CARE_PROVIDER_SITE_OTHER): Payer: Medicare Other | Admitting: Nurse Practitioner

## 2020-10-31 ENCOUNTER — Encounter: Payer: Self-pay | Admitting: Nurse Practitioner

## 2020-10-31 ENCOUNTER — Other Ambulatory Visit: Payer: Self-pay

## 2020-10-31 VITALS — BP 130/70 | HR 65 | Ht 62.0 in | Wt 103.4 lb

## 2020-10-31 DIAGNOSIS — I10 Essential (primary) hypertension: Secondary | ICD-10-CM | POA: Diagnosis not present

## 2020-10-31 DIAGNOSIS — F0150 Vascular dementia without behavioral disturbance: Secondary | ICD-10-CM

## 2020-10-31 DIAGNOSIS — F339 Major depressive disorder, recurrent, unspecified: Secondary | ICD-10-CM

## 2020-10-31 DIAGNOSIS — G4700 Insomnia, unspecified: Secondary | ICD-10-CM

## 2020-10-31 DIAGNOSIS — Z8673 Personal history of transient ischemic attack (TIA), and cerebral infarction without residual deficits: Secondary | ICD-10-CM | POA: Diagnosis not present

## 2020-10-31 MED ORDER — ASPIRIN EC 81 MG PO TBEC
81.0000 mg | DELAYED_RELEASE_TABLET | Freq: Every day | ORAL | 11 refills | Status: AC
Start: 2020-10-31 — End: ?

## 2020-10-31 MED ORDER — TRAZODONE HCL 50 MG PO TABS: ORAL_TABLET | ORAL | 1 refills | Status: DC

## 2020-10-31 MED ORDER — AMLODIPINE BESYLATE 10 MG PO TABS: 10.0000 mg | ORAL_TABLET | Freq: Every day | ORAL | 1 refills | Status: DC

## 2020-10-31 NOTE — Progress Notes (Signed)
Careteam: Patient Care Team: [name], NP as PCP - General (Geriatric Medicine) [name], MD as Consulting Physician (Ophthalmology) Calvert Cantor, MD as Consulting Physician (Ophthalmology) Terrance Mass, MD (Inactive) as Consulting Physician (Gynecology)  PLACE OF SERVICE:  [facility]  Advanced Directive information    Allergies  Allergen Reactions  . Vioxx [Rofecoxib] Nausea Only    Pt. Can't remember what reaction was but asks to leave on her profile.     Chief Complaint  Patient presents with  . Follow-up    2 week follow up on blood pressure and medications. Patient her with daughter-in-law toda.Patient states that stomach is still bothering her.     HPI: Patient is a 80 y.o. female for medication follow up.   There is some confusion on what she is taking for her blood pressure- she is taking norvasc 10 mg daily  She is not taking bystolic  She is taking losartan 100 mg daily   Dementia- progressive memory loss- she brought medication in and aricept not in bag.   Hyperlipidemia- on crestor.   Uses trazodone at home for sleep. Not in bag today.   Stomach is "yucky" occasionally diarrhea.   Review of Systems:  Review of Systems  Constitutional: Negative for chills, fever and weight loss.  HENT: Negative for tinnitus.   Respiratory: Negative for cough, sputum production and shortness of breath.   Cardiovascular: Negative for chest pain, palpitations and leg swelling.  Gastrointestinal: Positive for diarrhea and nausea. Negative for abdominal pain, constipation and heartburn.  Genitourinary: Negative for dysuria, frequency and urgency.  Musculoskeletal: Negative for back pain, falls, joint pain and myalgias.  Skin: Negative.   Neurological: Negative for dizziness and headaches.  Psychiatric/Behavioral: Negative for depression and memory loss. The patient does not have insomnia.    Past Medical History:  Diagnosis Date  . Allergy   .  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 Bunion 07/01/2010  . Depression 05/05/2007  . External hemorrhoids without mention of complication 01/26/9484  . GERD (gastroesophageal reflux disease) 2000  . Hyperlipidemia 06/29/2006  . Memory loss 04/15/2003  . Migraine without aura, without mention of intractable migraine without mention of status migrainosus 03/24/1999  . Osteoporosis, unspecified 04/15/2004  . 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 Unspecified essential hypertension 11/22/2007   Past Surgical History:  Procedure Laterality Date  . COLONOSCOPY  06-16-2007   Internal hemorrhoids,laxity of anal sphincter,polyp at 25cm form anal verge, tortuous sigmoid colon. Dr.Orr   . EYE SURGERY Bilateral 2010   cataract Dr. Bing Plume  . VAGINAL HYSTERECTOMY  1980   Social History:   reports that she has never smoked. She has never used smokeless tobacco. She reports that she does not drink alcohol and does not use drugs.  Family History  Problem Relation Age of Onset  . Alzheimer's disease Mother   . Diabetes Mother   . Transient ischemic attack Mother   . Heart disease Mother        MI, CHF  . Alcohol abuse Father   . Kidney disease Father   . Hypertension Sister   . Hypertension Brother   . Heart disease Brother   . Hypertension Brother   . Hypertension Sister   . Hypertension Brother   . Other Son        truck accident 07/2016  . Colon cancer Neg Hx   . Stomach cancer Neg Hx     Medications: Patient's Medications  New Prescriptions  No medications on file  Previous Medications   ASCORBIC ACID (VITAMIN C) 1000 MG TABLET    Take 1,000 mg by mouth daily.   ASPIRIN 81 MG TABLET    Take 81 mg by mouth daily. Take 1 tablet daily to prevent heart attack, stroke and clotting.   CALCIUM-VITAMINS C & D (CALCIUM/C/D PO)    Take by mouth.   CHOLECALCIFEROL 50 MCG (2000 UT) CAPS    Take 1 tablet daily   CINNAMON PO    1000 mg po qd   DONEPEZIL (ARICEPT) 5 MG TABLET     TAKE 1 TABLET BY MOUTH EVERYDAY AT BEDTIME   LOSARTAN (COZAAR) 100 MG TABLET    TAKE 1 TABLET BY MOUTH EVERY DAY   MULTIPLE VITAMIN (MULTI VITAMIN DAILY PO)    Take by mouth. Centrum Silver for Women Take 1 tablet daily   MULTIPLE VITAMINS-MINERALS (ICAPS AREDS FORMULA PO)    Take by mouth 2 (two) times daily.   NEBIVOLOL (BYSTOLIC) 10 MG TABLET    Take 1 tablet (10 mg total) by mouth daily.   ROSUVASTATIN (CRESTOR) 20 MG TABLET    TAKE 1 TABLET BY MOUTH EVERY DAY   SERTRALINE (ZOLOFT) 25 MG TABLET    Take 1 tablet (25 mg total) by mouth at bedtime.   TRAZODONE (DESYREL) 50 MG TABLET    TAKE 1 TABLET BY MOUTH EVERYDAY AT BEDTIME  Modified Medications   No medications on file  Discontinued Medications   CALCIUM-MAGNESIUM-VITAMIN D (CALCIUM 1200+D3 PO)    Take by mouth daily.    Physical Exam:  Vitals:   10/31/20 1151  BP: 130/70  Pulse: 65  SpO2: 97%  Weight: 103 lb 6.4 oz (46.9 kg)  Height: 5\' 2"  (1.575 m)   Body mass index is 18.91 kg/m. Wt Readings from Last 3 Encounters:  10/31/20 103 lb 6.4 oz (46.9 kg)  10/22/20 104 lb 12.8 oz (47.5 kg)  10/16/20 105 lb 3.2 oz (47.7 kg)    Physical Exam Constitutional:      General: She is not in acute distress.    Appearance: She is well-developed. She is not diaphoretic.     Comments: Thin female  HENT:     Head: Normocephalic and atraumatic.     Mouth/Throat:     Pharynx: No oropharyngeal exudate.  Eyes:     Conjunctiva/sclera: Conjunctivae normal.     Pupils: Pupils are equal, round, and reactive to light.  Cardiovascular:     Rate and Rhythm: Normal rate and regular rhythm.     Heart sounds: Normal heart sounds.  Pulmonary:     Effort: Pulmonary effort is normal.     Breath sounds: Normal breath sounds.  Abdominal:     General: Abdomen is flat. Bowel sounds are normal. There is no distension.     Palpations: Abdomen is soft. There is no mass.     Tenderness: There is no abdominal tenderness. There is no guarding or  rebound.     Hernia: No hernia is present.  Musculoskeletal:        General: No tenderness.     Cervical back: Normal range of motion and neck supple.  Skin:    General: Skin is warm and dry.  Neurological:     Mental Status: She is alert and oriented to person, place, and time. Mental status is at baseline.     Gait: Gait normal.  Psychiatric:        Attention and Perception: Attention normal.  Mood and Affect: Affect is flat.        Behavior: Behavior normal.     Labs reviewed: Basic Metabolic Panel: Recent Labs    04/21/20 0940 09/17/20 1109 10/16/20 1551  NA 140 141 142  K 5.0 4.6 4.0  CL 104 104 104  CO2 27 29 30   GLUCOSE 98 94 76  BUN 19 17 19   CREATININE 0.96* 0.82 0.77  CALCIUM 9.7 9.9 10.6*  TSH 3.71  --   --    Liver Function Tests: Recent Labs    04/21/20 0940 09/17/20 1109  AST 20 30  ALT 12 25  BILITOT 0.4 0.3  PROT 7.1 6.9   No results for input(s): LIPASE, AMYLASE in the last 8760 hours. No results for input(s): AMMONIA in the last 8760 hours. CBC: Recent Labs    04/21/20 0940 09/17/20 1109 10/16/20 1551  WBC 5.9 5.3 6.4  NEUTROABS 3,316 3,159 4,390  HGB 12.9 12.8 12.8  HCT 38.2 37.9 37.2  MCV 92.0 91.3 90.1  PLT 295 277 270   Lipid Panel: Recent Labs    04/21/20 0940 09/17/20 1109  CHOL 203* 202*  HDL 68 60  LDLCALC 114* 112*  TRIG 105 178*  CHOLHDL 3.0 3.4   TSH: Recent Labs    04/21/20 0940  TSH 3.71   A1C: No results found for: HGBA1C   Assessment/Plan 1. Essential hypertension -well controlled however some confusion about medication -will have her continue amlodipine 10 mg at this time- never stopped taking and swelling to ankles improved  -to continue losartan 100 mg daily -will remove bystolic from medication list bc she is not taking and daughter in law will have pharmacy remove it from her list at the pharmacy.  -continue dietary modifications.  - amLODipine (NORVASC) 10 MG tablet; Take 1 tablet (10  mg total) by mouth daily.  Dispense: 90 tablet; Refill: 1 - aspirin EC 81 MG tablet; Take 1 tablet (81 mg total) by mouth daily. Swallow whole.  Dispense: 30 tablet; Refill: 11  2. Depression, recurrent ([place]) -ongoing, she was not sure if she was taking her zoloft or not.  Due to insomnia will have her take trazodone and titrate off zoloft at this time. May consider restart later if symptoms do not improve.   3. Vascular dementia without behavioral disturbance ([facility]) - ongoing memory loss, she has been taking Aricept per daughter-in-law report. We will have her stop due to some GI side effects she is reporting to see if this improves symptoms.  - aspirin EC 81 MG tablet; Take 1 tablet (81 mg total) by mouth daily. Swallow whole.  Dispense: 30 tablet; Refill: 11  4. History of CVA (cerebrovascular accident) -noted on imaging which is contributing to cognitive impairment.  -important for good control of blood pressure and cholesterol at this time. Also to make sure she is taking EC ASA due to GI upset.  - aspirin EC 81 MG tablet; Take 1 tablet (81 mg total) by mouth daily. Swallow whole.  Dispense: 30 tablet; Refill: 11  5. Insomnia, unspecified type -ongoing, will have her take trazodone nightly at this time.  - traZODone (DESYREL) 50 MG tablet; TAKE 1 TABLET BY MOUTH EVERYDAY AT BEDTIME  Dispense: 90 tablet; Refill: 1 -consider changing trazodone to Remeron if she still has decrease appetite, insomnia and depression at next OV  Next appt: 1 month on mood, sleep and GI  Elim Peale K. [place], [facility] [phone]

## 2020-10-31 NOTE — Patient Instructions (Addendum)
Make sure you are eating 3 meals a day.  Ensure after smallest of the day.   Verify medications and let us know so we can update her list.   STOP zoloft Make sure you are taking trazodone 50 mg by mouth routinely at bedtime.   Enteric coated aspirin - take whole

## 2020-11-13 ENCOUNTER — Other Ambulatory Visit: Payer: Self-pay | Admitting: Nurse Practitioner

## 2020-11-13 DIAGNOSIS — I10 Essential (primary) hypertension: Secondary | ICD-10-CM

## 2020-12-11 ENCOUNTER — Other Ambulatory Visit: Payer: Self-pay | Admitting: Nurse Practitioner

## 2020-12-31 ENCOUNTER — Encounter: Payer: Self-pay | Admitting: Adult Health

## 2020-12-31 ENCOUNTER — Other Ambulatory Visit: Payer: Self-pay

## 2020-12-31 ENCOUNTER — Ambulatory Visit (INDEPENDENT_AMBULATORY_CARE_PROVIDER_SITE_OTHER): Payer: Medicare Other | Admitting: Adult Health

## 2020-12-31 VITALS — BP 126/70 | HR 78 | Temp 97.1°F | Resp 16 | Ht 62.0 in | Wt 107.0 lb

## 2020-12-31 DIAGNOSIS — R600 Localized edema: Secondary | ICD-10-CM | POA: Diagnosis not present

## 2020-12-31 DIAGNOSIS — I10 Essential (primary) hypertension: Secondary | ICD-10-CM | POA: Diagnosis not present

## 2020-12-31 MED ORDER — HYDROCHLOROTHIAZIDE 25 MG PO TABS: ORAL_TABLET | ORAL | 3 refills | Status: DC

## 2020-12-31 NOTE — Progress Notes (Signed)
Location:    [facility]   Place of Service:       Provider:   Durenda Age DNP  Code Status:   Full Code  Goals of Care:  Advanced Directives 12/31/2020  Does Patient Have a Medical Advance Directive? No  Does patient want to make changes to medical advance directive? -  Copy of [place] in Chart? -  Would patient like information on creating a medical advance directive? No - Patient declined  Pre-existing out of facility DNR order (yellow form or pink MOST form) -     Chief Complaint  Patient presents with  . Acute Visit    Complains of swelling in both feet ,legs, and ankles.    HPI: Patient is a 80 y.o. female  for complaints of swelling of both feet, lower legs and ankles. She stated that her edema started yesterday. Her sister noticed that her legs are swollen. She denies pain. No erythema nor open wound noted. She stated that her swelling were more yesterday. She is currently taking Amlodipine 10 mg daily for hypertension. BP today is 126/70. Noted that she has BLE 2+pedal pulse. No tenderness.   Past Medical History:  Diagnosis Date  . Allergy   . 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 Bunion 07/01/2010  . Depression 05/05/2007  . External hemorrhoids without mention of complication 32/99/2426  . GERD (gastroesophageal reflux disease) 2000  . Hyperlipidemia 06/29/2006  . Memory loss 04/15/2003  . Migraine without aura, without mention of intractable migraine without mention of status migrainosus 03/24/1999  . Osteoporosis, unspecified 04/15/2004  . 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 Unspecified essential hypertension 11/22/2007    Past Surgical History:  Procedure Laterality Date  . COLONOSCOPY  06-16-2007   Internal hemorrhoids,laxity of anal sphincter,polyp at 25cm form anal verge, tortuous sigmoid colon. Dr.Orr   . EYE SURGERY Bilateral 2010   cataract Dr. Bing Plume  . VAGINAL HYSTERECTOMY  1980    Allergies  Allergen  Reactions  . Vioxx [Rofecoxib] Nausea Only    Pt. Can't remember what reaction was but asks to leave on her profile.     Outpatient Encounter Medications as of 12/31/2020  Medication Sig  . amLODipine (NORVASC) 10 MG tablet Take 1 tablet (10 mg total) by mouth daily.  . Ascorbic Acid (VITAMIN C) 1000 MG tablet Take 1,000 mg by mouth daily.  Marland Kitchen aspirin EC 81 MG tablet Take 1 tablet (81 mg total) by mouth daily. Swallow whole.  . Calcium-Vitamins C & D (CALCIUM/C/D PO) Take by mouth.  . Cholecalciferol 50 MCG (2000 UT) CAPS Take 1 tablet daily  . donepezil (ARICEPT) 5 MG tablet TAKE 1 TABLET BY MOUTH EVERYDAY AT BEDTIME  . losartan (COZAAR) 100 MG tablet Take 1 tablet (100 mg total) by mouth daily. DX I10  . Multiple Vitamin (MULTI VITAMIN DAILY PO) Take by mouth. Centrum Silver for Women Take 1 tablet daily  . Multiple Vitamins-Minerals (ICAPS AREDS FORMULA PO) Take by mouth 2 (two) times daily.  . rosuvastatin (CRESTOR) 20 MG tablet TAKE 1 TABLET BY MOUTH EVERY DAY  . traZODone (DESYREL) 50 MG tablet TAKE 1 TABLET BY MOUTH EVERYDAY AT BEDTIME   No facility-administered encounter medications on file as of 12/31/2020.    Review of Systems:  Review of Systems  Constitutional: Negative for activity change, appetite change and fever.  HENT: Negative for congestion.   Eyes: Negative for discharge.  Respiratory: Negative for cough, chest tightness, shortness of breath and  wheezing.   Cardiovascular: Positive for leg swelling.  Gastrointestinal: Negative for abdominal distention, abdominal pain, constipation and vomiting.  Genitourinary: Negative for difficulty urinating and frequency.  Musculoskeletal: Negative for gait problem.  Skin: Negative for color change, rash and wound.  Neurological: Negative for dizziness, numbness and headaches.  Psychiatric/Behavioral: Negative for agitation and behavioral problems.    Health Maintenance  Topic Date Due  . Zoster Vaccines- Shingrix (1 of 2)  Never done  . INFLUENZA VACCINE  03/02/2021  . MAMMOGRAM  06/30/2021  . COLONOSCOPY (Pts 45-76yrs Insurance coverage will need to be confirmed)  05/13/2022  . TETANUS/TDAP  11/16/2028  . DEXA SCAN  Completed  . COVID-19 Vaccine  Completed  . PNA vac Low Risk Adult  Completed  . HPV VACCINES  Aged Out    Physical Exam: Vitals:   12/31/20 1535  BP: 126/70  Pulse: 78  Resp: 16  Temp: (!) 97.1 F (36.2 C)  SpO2: 98%  Weight: 107 lb (48.5 kg)  Height: $Remove'5\' 2"'fMehlFj$  (1.575 m)   Body mass index is 19.57 kg/m. Physical Exam HENT:     Head: Normocephalic and atraumatic.  Eyes:     Conjunctiva/sclera: Conjunctivae normal.  Cardiovascular:     Rate and Rhythm: Normal rate and regular rhythm.     Pulses: Normal pulses.     Heart sounds: Normal heart sounds.  Pulmonary:     Effort: Pulmonary effort is normal.     Breath sounds: Normal breath sounds.  Abdominal:     General: Bowel sounds are normal.     Palpations: Abdomen is soft.  Musculoskeletal:     Cervical back: Normal range of motion.     Right lower leg: Edema present.     Left lower leg: Edema present.     Comments: BLE 2+edema  Skin:    General: Skin is warm.  Neurological:     General: No focal deficit present.     Mental Status: She is alert and oriented to person, place, and time. Mental status is at baseline.  Psychiatric:        Mood and Affect: Mood normal.        Behavior: Behavior normal.     Labs reviewed: Basic Metabolic Panel: Recent Labs    04/21/20 0940 09/17/20 1109 10/16/20 1551  NA 140 141 142  K 5.0 4.6 4.0  CL 104 104 104  CO2 $Re'27 29 30  'hfK$ GLUCOSE 98 94 76  BUN $Re'19 17 19  'jiq$ CREATININE 0.96* 0.82 0.77  CALCIUM 9.7 9.9 10.6*  TSH 3.71  --   --    Liver Function Tests: Recent Labs    04/21/20 0940 09/17/20 1109  AST 20 30  ALT 12 25  BILITOT 0.4 0.3  PROT 7.1 6.9   CBC: Recent Labs    04/21/20 0940 09/17/20 1109 10/16/20 1551  WBC 5.9 5.3 6.4  NEUTROABS 3,316 3,159 4,390  HGB  12.9 12.8 12.8  HCT 38.2 37.9 37.2  MCV 92.0 91.3 90.1  PLT 295 277 270   Lipid Panel: Recent Labs    04/21/20 0940 09/17/20 1109  CHOL 203* 202*  HDL 68 60  LDLCALC 114* 112*  TRIG 105 178*  CHOLHDL 3.0 3.4    Procedures since last visit: MM 3D SCREEN BREAST BILATERAL  Result Date: 07/19/2019 CLINICAL DATA:  Screening. EXAM: DIGITAL SCREENING BILATERAL MAMMOGRAM WITH TOMO AND CAD COMPARISON:  Previous exam(s). ACR Breast Density Category c: The breast tissue is heterogeneously dense, which may obscure  small masses. FINDINGS: There are no findings suspicious for malignancy. Images were processed with CAD. IMPRESSION: No mammographic evidence of malignancy. A result letter of this screening mammogram will be mailed directly to the patient. RECOMMENDATION: Screening mammogram in one year. (Code:SM-B-01Y) BI-RADS CATEGORY  1: Negative. Electronically Signed   By: Nolon Nations M.D.   On: 07/19/2019 12:52    Assessment/Plan  1. Lower extremity edema -  Thought to be probably a side effect of Amlodipine, will discontinue Amlodipine -  Instructed to put compression stockings on in am and off at bedtime -  Elevate legs when in bed  2. Essential hypertension -  BP 130/88 -  Will discontinue Amlodipine due to possible cause of BLE edema -   Will start HCTZ, instructed eat banana for potassium supllementation and to log BP and bring during next visit - hydrochlorothiazide (HYDRODIURIL) 25 MG tablet; Take 1/2 tab = 12.5 mg orally daily  Dispense: 30 tablet; Refill: 3 - BMP with eGFR([facility]); Future   Labs/tests ordered:  BMP on 6/08  Next appt:  02/04/2021

## 2020-12-31 NOTE — Patient Instructions (Signed)
Edema  Edema is when you have too much fluid in your body or under your skin. Edema may make your legs, feet, and ankles swell up. Swelling is also common in looser tissues, like around your eyes. This is a common condition. It gets more common as you get older. There are many possible causes of edema. Eating too much salt (sodium) and being on your feet or sitting for a long time can cause edema in your legs, feet, and ankles. Hot weather may make edema worse. Edema is usually painless. Your skin may look swollen or shiny. Follow these instructions at home:  Keep the swollen body part raised (elevated) above the level of your heart when you are sitting or lying down.  Do not sit still or stand for a long time.  Do not wear tight clothes. Do not wear garters on your upper legs.  Exercise your legs. This can help the swelling go down.  Wear elastic bandages or support stockings as told by your doctor.  Eat a low-salt (low-sodium) diet to reduce fluid as told by your doctor.  Depending on the cause of your swelling, you may need to limit how much fluid you drink (fluid restriction).  Take over-the-counter and prescription medicines only as told by your doctor. Contact a doctor if:  Treatment is not working.  You have heart, liver, or kidney disease and have symptoms of edema.  You have sudden and unexplained weight gain. Get help right away if:  You have shortness of breath or chest pain.  You cannot breathe when you lie down.  You have pain, redness, or warmth in the swollen areas.  You have heart, liver, or kidney disease and get edema all of a sudden.  You have a fever and your symptoms get worse all of a sudden. Summary  Edema is when you have too much fluid in your body or under your skin.  Edema may make your legs, feet, and ankles swell up. Swelling is also common in looser tissues, like around your eyes.  Raise (elevate) the swollen body part above the level of your  heart when you are sitting or lying down.  Follow your doctor's instructions about diet and how much fluid you can drink (fluid restriction). This information is not intended to replace advice given to you by your health care provider. Make sure you discuss any questions you have with your health care provider. Document Revised: 05/14/2020 Document Reviewed: 05/14/2020 Elsevier Patient Education  2021 Elsevier Inc.  

## 2021-01-12 ENCOUNTER — Other Ambulatory Visit: Payer: Self-pay

## 2021-01-12 ENCOUNTER — Encounter: Payer: Self-pay | Admitting: Nurse Practitioner

## 2021-01-12 ENCOUNTER — Ambulatory Visit (INDEPENDENT_AMBULATORY_CARE_PROVIDER_SITE_OTHER): Payer: Medicare Other | Admitting: Nurse Practitioner

## 2021-01-12 ENCOUNTER — Ambulatory Visit: Payer: Self-pay | Admitting: Family

## 2021-01-12 VITALS — BP 124/78 | HR 70 | Temp 96.9°F | Ht 61.0 in | Wt 106.0 lb

## 2021-01-12 DIAGNOSIS — F0150 Vascular dementia without behavioral disturbance: Secondary | ICD-10-CM

## 2021-01-12 DIAGNOSIS — R197 Diarrhea, unspecified: Secondary | ICD-10-CM

## 2021-01-12 NOTE — Patient Instructions (Addendum)
To start probiotic- to take twice daily To add fiber twice daily- benafiber twice daily add to drink.  Bland diet, advance as tolerated We will test stools- bring back to office once collected.   Diarrhea, Adult Diarrhea is frequent loose and watery bowel movements. Diarrhea can make you feel weak and cause you to become dehydrated. Dehydration can make you tiredand thirsty, cause you to have a dry mouth, and decrease how often you urinate. Diarrhea typically lasts 2-3 days. However, it can last longer if it is a sign of something more serious. It is important to treat your diarrhea as told byyour health care provider. Follow these instructions at home: Eating and drinking     Follow these recommendations as told by your health care provider: Take an oral rehydration solution (ORS). This is an over-the-counter medicine that helps return your body to its normal balance of nutrients and water. It is found at pharmacies and retail stores. Drink plenty of fluids, such as water, ice chips, diluted fruit juice, and low-calorie sports drinks. You can drink milk also, if desired. Avoid drinking fluids that contain a lot of sugar or caffeine, such as energy drinks, sports drinks, and soda. Eat bland, easy-to-digest foods in small amounts as you are able. These foods include bananas, applesauce, rice, lean meats, toast, and crackers. Avoid alcohol. Avoid spicy or fatty foods.  Medicines Take over-the-counter and prescription medicines only as told by your health care provider. If you were prescribed an antibiotic medicine, take it as told by your health care provider. Do not stop using the antibiotic even if you start to feel better. General instructions  Wash your hands often using soap and water. If soap and water are not available, use a hand sanitizer. Others in the household should wash their hands as well. Hands should be washed: After using the toilet or changing a diaper. Before preparing,  cooking, or serving food. While caring for a sick person or while visiting someone in a hospital. Drink enough fluid to keep your urine pale yellow. Rest at home while you recover. Watch your condition for any changes. Take a warm bath to relieve any burning or pain from frequent diarrhea episodes. Keep all follow-up visits as told by your health care provider. This is important.  Contact a health care provider if: You have a fever. Your diarrhea gets worse. You have new symptoms. You cannot keep fluids down. You feel light-headed or dizzy. You have a headache. You have muscle cramps. Get help right away if: You have chest pain. You feel extremely weak or you faint. You have bloody or black stools or stools that look like tar. You have severe pain, cramping, or bloating in your abdomen. You have trouble breathing or you are breathing very quickly. Your heart is beating very quickly. Your skin feels cold and clammy. You feel confused. You have signs of dehydration, such as: Dark urine, very little urine, or no urine. Cracked lips. Dry mouth. Sunken eyes. Sleepiness. Weakness. Summary Diarrhea is frequent loose and watery bowel movements. Diarrhea can make you feel weak and cause you to become dehydrated. Drink enough fluids to keep your urine pale yellow. Make sure that you wash your hands after using the toilet. If soap and water are not available, use hand sanitizer. Contact a health care provider if your diarrhea gets worse or you have new symptoms. Get help right away if you have signs of dehydration. This information is not intended to replace advice given to  you by your health care provider. Make sure you discuss any questions you have with your healthcare provider. Document Revised: 12/05/2018 Document Reviewed: 12/23/2017 Elsevier Patient Education  [place].
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
## 2021-01-13 ENCOUNTER — Other Ambulatory Visit: Payer: Self-pay | Admitting: Nurse Practitioner

## 2021-01-13 ENCOUNTER — Other Ambulatory Visit: Payer: Self-pay

## 2021-01-13 DIAGNOSIS — R197 Diarrhea, unspecified: Secondary | ICD-10-CM

## 2021-01-13 LAB — COMPLETE METABOLIC PANEL WITH GFR
AG Ratio: 1.9 (calc) (ref 1.0–2.5)
ALT: 16 U/L (ref 6–29)
AST: 24 U/L (ref 10–35)
Albumin: 4.7 g/dL (ref 3.6–5.1)
Alkaline phosphatase (APISO): 55 U/L (ref 37–153)
BUN/Creatinine Ratio: 18 (calc) (ref 6–22)
BUN: 17 mg/dL (ref 7–25)
CO2: 28 mmol/L (ref 20–32)
Calcium: 10 mg/dL (ref 8.6–10.4)
Chloride: 101 mmol/L (ref 98–110)
Creat: 0.93 mg/dL — ABNORMAL HIGH (ref 0.60–0.88)
GFR, Est African American: 67 mL/min/{1.73_m2} (ref >=60)
GFR, Est Non African American: 58 mL/min/{1.73_m2} — ABNORMAL LOW (ref >=60)
Globulin: 2.5 g/dL (calc) (ref 1.9–3.7)
Glucose, Bld: 94 mg/dL (ref 65–139)
Potassium: 4.1 mmol/L (ref 3.5–5.3)
Sodium: 139 mmol/L (ref 135–146)
Total Bilirubin: 0.3 mg/dL (ref 0.2–1.2)
Total Protein: 7.2 g/dL (ref 6.1–8.1)

## 2021-01-13 LAB — CBC WITH DIFFERENTIAL/PLATELET
Absolute Monocytes: 450 cells/uL (ref 200–950)
Basophils Absolute: 80 cells/uL (ref 0–200)
Basophils Relative: 1.4 %
Eosinophils Absolute: 80 cells/uL (ref 15–500)
Eosinophils Relative: 1.4 %
HCT: 38.1 % (ref 35.0–45.0)
Hemoglobin: 13.2 g/dL (ref 11.7–15.5)
Lymphs Abs: 1949 cells/uL (ref 850–3900)
MCH: 31.4 pg (ref 27.0–33.0)
MCHC: 34.6 g/dL (ref 32.0–36.0)
MCV: 90.7 fL (ref 80.0–100.0)
MPV: 11 fL (ref 7.5–12.5)
Monocytes Relative: 7.9 %
Neutro Abs: 3141 cells/uL (ref 1500–7800)
Neutrophils Relative %: 55.1 %
Platelets: 302 10*3/uL (ref 140–400)
RBC: 4.2 10*6/uL (ref 3.80–5.10)
RDW: 12 % (ref 11.0–15.0)
Total Lymphocyte: 34.2 %
WBC: 5.7 10*3/uL (ref 3.8–10.8)

## 2021-01-14 LAB — CLOSTRIDIUM DIFFICILE TOXIN B, QUALITATIVE, REAL-TIME PCR: Toxigenic C. Difficile by PCR: NOT DETECTED

## 2021-01-15 LAB — OVA AND PARASITE EXAMINATION
CONCENTRATE RESULT:: NONE SEEN
MICRO NUMBER:: 12006211
SPECIMEN QUALITY:: ADEQUATE
TRICHROME RESULT:: NONE SEEN

## 2021-01-15 LAB — SALMONELLA/SHIGELLA CULT, CAMPY EIA AND SHIGA TOXIN RFL ECOLI
MICRO NUMBER: 12007284
MICRO NUMBER:: 12007285
MICRO NUMBER:: 12007286
Result:: NOT DETECTED
SHIGA RESULT:: NOT DETECTED
SPECIMEN QUALITY: ADEQUATE
SPECIMEN QUALITY:: ADEQUATE
SPECIMEN QUALITY:: ADEQUATE

## 2021-02-04 ENCOUNTER — Ambulatory Visit (INDEPENDENT_AMBULATORY_CARE_PROVIDER_SITE_OTHER): Payer: Medicare Other | Admitting: Nurse Practitioner

## 2021-02-04 ENCOUNTER — Encounter: Payer: Self-pay | Admitting: Nurse Practitioner

## 2021-02-04 ENCOUNTER — Other Ambulatory Visit: Payer: Self-pay

## 2021-02-04 VITALS — BP 120/79 | HR 64 | Temp 97.7°F | Ht 61.0 in | Wt 105.6 lb

## 2021-02-04 DIAGNOSIS — R413 Other amnesia: Secondary | ICD-10-CM | POA: Diagnosis not present

## 2021-02-04 DIAGNOSIS — I10 Essential (primary) hypertension: Secondary | ICD-10-CM | POA: Diagnosis not present

## 2021-02-04 DIAGNOSIS — R197 Diarrhea, unspecified: Secondary | ICD-10-CM | POA: Diagnosis not present

## 2021-02-04 DIAGNOSIS — E782 Mixed hyperlipidemia: Secondary | ICD-10-CM

## 2021-02-04 DIAGNOSIS — M810 Age-related osteoporosis without current pathological fracture: Secondary | ICD-10-CM

## 2021-02-04 DIAGNOSIS — G4700 Insomnia, unspecified: Secondary | ICD-10-CM | POA: Diagnosis not present

## 2021-02-04 DIAGNOSIS — R600 Localized edema: Secondary | ICD-10-CM | POA: Diagnosis not present

## 2021-02-04 DIAGNOSIS — K2100 Gastro-esophageal reflux disease with esophagitis, without bleeding: Secondary | ICD-10-CM | POA: Diagnosis not present

## 2021-02-04 NOTE — Progress Notes (Signed)
Careteam: Patient Care Team: [name], NP as PCP - General (Geriatric Medicine) [name], MD as Consulting Physician (Ophthalmology) Calvert Cantor, MD as Consulting Physician (Ophthalmology) Terrance Mass, MD (Inactive) as Consulting Physician (Gynecology)  PLACE OF SERVICE:  [place] Directive information Does Patient Have a Medical Advance Directive?: No, Would patient like information on creating a medical advance directive?: No - Patient declined  Allergies  Allergen Reactions   Vioxx [Rofecoxib] Nausea Only    Pt. Can't remember what reaction was but asks to leave on her profile.     Chief Complaint  Patient presents with   Medical Management of Chronic Issues    3 month follow up. Patient states she is becoming more forgetful   Health Maintenance    Zoster vaccine, 2nd COVID booster     HPI: Patient is a 80 y.o. female for routine follow up.   She does remember what medication that she is taking. Does not have bottles here today.   Blood pressure is well controlled. On hctz and losartan   Reports diarrhea has improved- was a big issue at that time- did not go to the GI but improved. She started probiotic.   Recently had blood work when she had diarrhea.   LE edema- resolved with hctz.   Tries to stay active in her development.   Reports mood is good.   Bone density scheduled for august.   Review of Systems:  Review of Systems  Constitutional:  Negative for chills, fever and weight loss.  HENT:  Negative for tinnitus.   Respiratory:  Negative for cough, sputum production and shortness of breath.   Cardiovascular:  Negative for chest pain, palpitations and leg swelling.  Gastrointestinal:  Negative for abdominal pain, constipation, diarrhea and heartburn.  Genitourinary:  Negative for dysuria, frequency and urgency.  Musculoskeletal:  Negative for back pain, falls, joint pain and myalgias.  Skin: Negative.    Neurological:  Negative for dizziness and headaches.  Psychiatric/Behavioral:  Positive for memory loss. Negative for depression. The patient does not have insomnia.    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 Dr.Orr    EYE SURGERY Bilateral 2010   cataract Dr. Bing Plume   VAGINAL HYSTERECTOMY  1980   Social History:   reports that she has never smoked. She has never used smokeless tobacco. She reports that she does not drink alcohol and does not use drugs.  Family History  Problem Relation Age of Onset   Alzheimer's disease Mother    Diabetes Mother    Transient ischemic attack Mother    Heart disease Mother        MI, CHF   Alcohol abuse Father    Kidney disease Father    Hypertension Sister    Hypertension Brother    Heart disease Brother    Hypertension Brother  Hypertension Sister    Hypertension Brother    Other Son        truck accident 07/2016   Colon cancer Neg Hx    Stomach cancer Neg Hx     Medications: Patient's Medications  New Prescriptions   No medications on file  Previous Medications   ASCORBIC ACID (VITAMIN C) 1000 MG TABLET    Take 1,000 mg by mouth daily.   ASPIRIN EC 81 MG TABLET    Take 1 tablet (81 mg total) by mouth daily. Swallow whole.   CALCIUM-VITAMINS C & D  (CALCIUM/C/D PO)    Take by mouth.   CHOLECALCIFEROL 50 MCG (2000 UT) CAPS    Take 1 tablet daily   HYDROCHLOROTHIAZIDE (HYDRODIURIL) 25 MG TABLET    Take 1/2 tab = 12.5 mg orally daily   LOSARTAN (COZAAR) 100 MG TABLET    Take 1 tablet (100 mg total) by mouth daily. DX I10   MULTIPLE VITAMIN (MULTI VITAMIN DAILY PO)    Take by mouth. Centrum Silver for Women Take 1 tablet daily   MULTIPLE VITAMINS-MINERALS (ICAPS AREDS FORMULA PO)    Take by mouth 2 (two) times daily.   ROSUVASTATIN (CRESTOR) 20 MG TABLET    TAKE 1 TABLET BY MOUTH EVERY DAY   TRAZODONE (DESYREL) 50 MG TABLET    TAKE 1 TABLET BY MOUTH EVERYDAY AT BEDTIME  Modified Medications   No medications on file  Discontinued Medications   No medications on file    Physical Exam:  Vitals:   02/04/21 1134  BP: 120/79  Pulse: 64  Temp: 97.7 F (36.5 C)  TempSrc: Temporal  SpO2: 97%  Weight: 105 lb 9.6 oz (47.9 kg)  Height: 5\' 1"  (1.549 m)   Body mass index is 19.95 kg/m. Wt Readings from Last 3 Encounters:  02/04/21 105 lb 9.6 oz (47.9 kg)  01/12/21 106 lb (48.1 kg)  12/31/20 107 lb (48.5 kg)    Physical Exam Constitutional:      General: She is not in acute distress.    Appearance: She is well-developed. She is not diaphoretic.  HENT:     Head: Normocephalic and atraumatic.     Mouth/Throat:     Pharynx: No oropharyngeal exudate.  Eyes:     Conjunctiva/sclera: Conjunctivae normal.     Pupils: Pupils are equal, round, and reactive to light.  Cardiovascular:     Rate and Rhythm: Normal rate and regular rhythm.     Heart sounds: Normal heart sounds.  Pulmonary:     Effort: Pulmonary effort is normal.     Breath sounds: Normal breath sounds.  Abdominal:     General: Bowel sounds are normal.     Palpations: Abdomen is soft.  Musculoskeletal:     Cervical back: Normal range of motion and neck supple.     Right lower leg: No edema.     Left lower leg: No edema.  Skin:    General: Skin is warm and dry.   Neurological:     Mental Status: She is alert. Mental status is at baseline.  Psychiatric:        Mood and Affect: Mood normal.    Labs reviewed: Basic Metabolic Panel: Recent Labs    04/21/20 0940 09/17/20 1109 10/16/20 1551 01/12/21 1552  NA 140 141 142 139  K 5.0 4.6 4.0 4.1  CL 104 104 104 101  CO2 27 29 30 28   GLUCOSE 98 94 76 94  BUN 19 17 19  17  CREATININE 0.96* 0.82 0.77 0.93*  CALCIUM 9.7 9.9 10.6* 10.0  TSH 3.71  --   --   --    Liver Function Tests: Recent Labs    04/21/20 0940 09/17/20 1109 01/12/21 1552  AST 20 30 24   ALT 12 25 16   BILITOT 0.4 0.3 0.3  PROT 7.1 6.9 7.2   No results for input(s): LIPASE, AMYLASE in the last 8760 hours. No results for input(s): AMMONIA in the last 8760 hours. CBC: Recent Labs    09/17/20 1109 10/16/20 1551 01/12/21 1552  WBC 5.3 6.4 5.7  NEUTROABS 3,159 4,390 3,141  HGB 12.8 12.8 13.2  HCT 37.9 37.2 38.1  MCV 91.3 90.1 90.7  PLT 277 270 302   Lipid Panel: Recent Labs    04/21/20 0940 09/17/20 1109  CHOL 203* 202*  HDL 68 60  LDLCALC 114* 112*  TRIG 105 178*  CHOLHDL 3.0 3.4   TSH: Recent Labs    04/21/20 0940  TSH 3.71   A1C: No results found for: HGBA1C   Assessment/Plan 1. Essential hypertension Controlled on losartan and hctz.  2. Gastroesophageal reflux disease with esophagitis without hemorrhage -stable at this time, not currently on medications.   3. Mixed hyperlipidemia Continues on crestor 20 mg daily  4. Insomnia, unspecified type -stable, always had trouble sleeping but no increase or worsening in this. Continues on trazodone 50 mg daily  5. Acute diarrhea -improved at this time, continue probiotic  6. Age-related osteoporosis without current pathological fracture -bone density scheduled.   7. Bilateral leg edema Improved on hctz  8. Memory loss -ongoing, aricept was stopped due to possibly making diarrhea worse. She continues to stay active and very independent.     Next appt: 4 months, sooner if needed.  Carlos American. [place], [facility] [phone]

## 2021-02-06 DIAGNOSIS — H52223 Regular astigmatism, bilateral: Secondary | ICD-10-CM | POA: Diagnosis not present

## 2021-02-06 DIAGNOSIS — H353122 Nonexudative age-related macular degeneration, left eye, intermediate dry stage: Secondary | ICD-10-CM | POA: Diagnosis not present

## 2021-02-06 DIAGNOSIS — R519 Headache, unspecified: Secondary | ICD-10-CM | POA: Diagnosis not present

## 2021-02-06 DIAGNOSIS — H04123 Dry eye syndrome of bilateral lacrimal glands: Secondary | ICD-10-CM | POA: Diagnosis not present

## 2021-02-06 DIAGNOSIS — H524 Presbyopia: Secondary | ICD-10-CM | POA: Diagnosis not present

## 2021-02-06 DIAGNOSIS — H353111 Nonexudative age-related macular degeneration, right eye, early dry stage: Secondary | ICD-10-CM | POA: Diagnosis not present

## 2021-03-11 ENCOUNTER — Other Ambulatory Visit: Payer: Self-pay | Admitting: Nurse Practitioner

## 2021-03-25 ENCOUNTER — Ambulatory Visit
Admission: RE | Admit: 2021-03-25 | Discharge: 2021-03-25 | Disposition: A | Discharge status: Home / Self Care | Payer: Medicare Other | Source: Ambulatory Visit | Attending: Nurse Practitioner | Admitting: Nurse Practitioner

## 2021-03-25 ENCOUNTER — Other Ambulatory Visit: Payer: Self-pay

## 2021-03-25 DIAGNOSIS — E2839 Other primary ovarian failure: Secondary | ICD-10-CM

## 2021-03-25 DIAGNOSIS — M810 Age-related osteoporosis without current pathological fracture: Secondary | ICD-10-CM | POA: Diagnosis not present

## 2021-03-25 DIAGNOSIS — M85852 Other specified disorders of bone density and structure, left thigh: Secondary | ICD-10-CM | POA: Diagnosis not present

## 2021-03-25 DIAGNOSIS — Z780 Asymptomatic menopausal state: Secondary | ICD-10-CM | POA: Diagnosis not present

## 2021-04-02 ENCOUNTER — Other Ambulatory Visit: Payer: Self-pay | Admitting: Nurse Practitioner

## 2021-04-02 DIAGNOSIS — R413 Other amnesia: Secondary | ICD-10-CM

## 2021-04-16 ENCOUNTER — Ambulatory Visit: Payer: Medicare Other | Admitting: Family

## 2021-04-20 ENCOUNTER — Ambulatory Visit: Payer: Medicare Other | Admitting: Family

## 2021-04-22 ENCOUNTER — Other Ambulatory Visit: Payer: Self-pay | Admitting: Nurse Practitioner

## 2021-04-22 DIAGNOSIS — G4700 Insomnia, unspecified: Secondary | ICD-10-CM

## 2021-05-04 ENCOUNTER — Other Ambulatory Visit: Payer: Self-pay

## 2021-05-04 ENCOUNTER — Ambulatory Visit
Admission: RE | Admit: 2021-05-04 | Discharge: 2021-05-04 | Disposition: A | Discharge status: Home / Self Care | Payer: Medicare Other | Source: Ambulatory Visit | Attending: Orthopedic Surgery | Admitting: Orthopedic Surgery

## 2021-05-04 ENCOUNTER — Encounter: Payer: Self-pay | Admitting: Orthopedic Surgery

## 2021-05-04 ENCOUNTER — Ambulatory Visit (INDEPENDENT_AMBULATORY_CARE_PROVIDER_SITE_OTHER): Payer: Medicare Other | Admitting: Orthopedic Surgery

## 2021-05-04 VITALS — BP 123/80 | HR 78 | Temp 97.0°F | Ht 61.0 in | Wt 104.8 lb

## 2021-05-04 DIAGNOSIS — M25552 Pain in left hip: Secondary | ICD-10-CM | POA: Diagnosis not present

## 2021-05-04 DIAGNOSIS — M25562 Pain in left knee: Secondary | ICD-10-CM | POA: Diagnosis not present

## 2021-05-04 MED ORDER — GABAPENTIN 100 MG PO CAPS: 100.0000 mg | ORAL_CAPSULE | Freq: Three times a day (TID) | ORAL | 3 refills | Status: DC

## 2021-05-04 NOTE — Progress Notes (Signed)
Careteam: Patient Care Team: [name], NP as PCP - General (Geriatric Medicine) [name], MD as Consulting Physician (Ophthalmology) Calvert Cantor, MD as Consulting Physician (Ophthalmology) Terrance Mass, MD (Inactive) as Consulting Physician (Gynecology)  Seen by: Windell Moulding, AGNP-C  PLACE OF SERVICE:  [place] Directive information    Allergies  Allergen Reactions   Vioxx [Rofecoxib] Nausea Only    Pt. Can't remember what reaction was but asks to leave on her profile.     Chief Complaint  Patient presents with   Acute Visit    Patient having problem with left leg. Patient states that pain comes and goes. She has swelling in ankles sometimes. Patient would like to get xray. She has had pain for a couple of weeks. Pain travels from hip to leg, but more in the leg.     HPI: Patient is a 80 y.o. female seen today for acute visit due to left leg pain. Pain radiates from left hip to ankle, pain worse in hip and knee. Pain rated as 5-6, described as intermittent,sharp and stinging. This past weekend, she attended a wedding and reports increased pain after attending. Suspects increased movement like dancing made left leg pain worse. No recent injury or fall. Ambulating on her own. She has tried taking tylenol 325 mg prn without success, has not taken a dose in past few days.   Review of Systems:  Review of Systems  Constitutional:  Negative for chills, fever, malaise/fatigue and weight loss.  Respiratory:  Negative for cough.   Cardiovascular:  Negative for chest pain.  Musculoskeletal:  Positive for joint pain and myalgias. Negative for back pain and falls.  Psychiatric/Behavioral:  Negative for depression. The patient is not nervous/anxious.    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 Dr.Orr    EYE SURGERY Bilateral 2010   cataract Dr. Bing Plume   VAGINAL HYSTERECTOMY  1980   Social History:   reports that she has never smoked. She has never used smokeless tobacco. She reports that she does not drink alcohol and does not use drugs.  Family History  Problem Relation Age of Onset   Alzheimer's disease Mother    Diabetes Mother    Transient ischemic attack Mother    Heart disease Mother        MI, CHF   Alcohol abuse Father    Kidney disease Father    Hypertension Sister    Hypertension Brother    Heart disease Brother    Hypertension Brother    Hypertension Sister    Hypertension Brother    Other Son        truck accident 07/2016   Colon cancer Neg Hx    Stomach cancer Neg Hx     Medications: Patient's Medications  New Prescriptions   No medications on file  Previous Medications  ASCORBIC ACID (VITAMIN C) 1000 MG TABLET    Take 1,000 mg by mouth daily.   ASPIRIN EC 81 MG TABLET    Take 1 tablet (81 mg total) by mouth daily. Swallow whole.   CALCIUM-VITAMINS C & D (CALCIUM/C/D PO)    Take by mouth.   CHOLECALCIFEROL 50 MCG (2000 UT) CAPS    Take 1 tablet daily   HYDROCHLOROTHIAZIDE (HYDRODIURIL) 25 MG TABLET    Take 1/2 tab = 12.5 mg orally daily   LOSARTAN (COZAAR) 100 MG TABLET    Take 1 tablet (100 mg total) by mouth daily. DX I10   MULTIPLE VITAMIN (MULTI VITAMIN DAILY PO)    Take by mouth. Centrum Silver for Women Take 1  tablet daily   MULTIPLE VITAMINS-MINERALS (ICAPS AREDS FORMULA PO)    Take by mouth 2 (two) times daily.   ROSUVASTATIN (CRESTOR) 20 MG TABLET    TAKE 1 TABLET BY MOUTH EVERY DAY   TRAZODONE (DESYREL) 50 MG TABLET    TAKE 1 TABLET BY MOUTH EVERYDAY AT BEDTIME  Modified Medications   No medications on file  Discontinued Medications   No medications on file    Physical Exam:  Vitals:   05/04/21 1402  BP: 123/80  Pulse: 78  Temp: (!) 97 F (36.1 C)  SpO2: 98%  Weight: 104 lb 12.8 oz (47.5 kg)  Height: 5\' 1"  (1.549 m)   Body mass index is 19.8 kg/m. Wt Readings from Last 3 Encounters:  05/04/21 104 lb 12.8 oz (47.5 kg)  02/04/21 105 lb 9.6 oz (47.9 kg)  01/12/21 106 lb (48.1 kg)    Physical Exam Vitals reviewed.  Constitutional:      General: She is not in acute distress. Cardiovascular:     Rate and Rhythm: Normal rate and regular rhythm.     Pulses: Normal pulses.     Heart sounds: Normal heart sounds. No murmur heard. Pulmonary:     Effort: Pulmonary effort is normal. No respiratory distress.     Breath sounds: Normal breath sounds. No wheezing.  Musculoskeletal:     Cervical back: Normal.     Thoracic back: Normal.     Lumbar back: No swelling or tenderness. Normal range of motion.     Left hip: Tenderness present. No deformity or crepitus. Normal range of motion. Normal strength.     Left knee: No swelling, effusion, erythema or crepitus. Normal range of motion. No tenderness.     Right lower leg: No edema.     Left lower leg: No edema.     Left ankle: No swelling. No tenderness. Normal range of motion.     Comments: Tenderness noted left lateral hip on exam.   Skin:    General: Skin is warm and dry.     Capillary Refill: Capillary refill takes less than 2 seconds.  Neurological:     General: No focal deficit present.     Mental Status: She is alert and oriented to person, place, and time.  Psychiatric:        Mood and Affect: Mood normal.        Behavior:  Behavior normal.    Labs reviewed: Basic Metabolic Panel: Recent Labs    09/17/20 1109 10/16/20 1551 01/12/21 1552  NA 141 142 139  K 4.6 4.0 4.1  CL 104 104 101  CO2 29 30 28   GLUCOSE 94 76 94  BUN 17 19 17   CREATININE 0.82 0.77 0.93*  CALCIUM 9.9 10.6* 10.0  Liver Function Tests: Recent Labs    09/17/20 1109 01/12/21 1552  AST 30 24  ALT 25 16  BILITOT 0.3 0.3  PROT 6.9 7.2   No results for input(s): LIPASE, AMYLASE in the last 8760 hours. No results for input(s): AMMONIA in the last 8760 hours. CBC: Recent Labs    09/17/20 1109 10/16/20 1551 01/12/21 1552  WBC 5.3 6.4 5.7  NEUTROABS 3,159 4,390 3,141  HGB 12.8 12.8 13.2  HCT 37.9 37.2 38.1  MCV 91.3 90.1 90.7  PLT 277 270 302   Lipid Panel: Recent Labs    09/17/20 1109  CHOL 202*  HDL 60  LDLCALC 112*  TRIG 178*  CHOLHDL 3.4   TSH: No results for input(s): TSH in the last 8760 hours. A1C: No results found for: HGBA1C   Assessment/Plan 1. Left hip pain - exam unremarkable except for tenderness over lateral side - DG Hip Unilat W OR W/O Pelvis 2-3 Views Left; Future - DG Knee Complete 4 Views Left; Future - gabapentin (NEURONTIN) 100 MG capsule; Take 1 capsule (100 mg total) by mouth 3 (three) times daily.  Dispense: 90 capsule; Refill: 3  2. Acute pain of left knee - exam unremarkable - recommend trying tylenol 1000 mg po bid prn  Total time: 25 minutes. Greater than 50 % of total time spent doing patient education on symptom management.    Next appt: none [facility] [place], [facility] [phone]

## 2021-05-04 NOTE — Patient Instructions (Signed)
X ray of left hip and knee to be done at [facility]- orders placed  Gabapentin 100 mg- take one pill every night - please take for left leg pain/ if you feel too drowsy please stop medication  Tylenol 1000 mg- may take twice daily for left leg pain- may need to take on daily basis to help with pain

## 2021-05-11 DIAGNOSIS — L821 Other seborrheic keratosis: Secondary | ICD-10-CM | POA: Diagnosis not present

## 2021-05-11 DIAGNOSIS — L814 Other melanin hyperpigmentation: Secondary | ICD-10-CM | POA: Diagnosis not present

## 2021-05-11 DIAGNOSIS — D225 Melanocytic nevi of trunk: Secondary | ICD-10-CM | POA: Diagnosis not present

## 2021-05-11 DIAGNOSIS — L570 Actinic keratosis: Secondary | ICD-10-CM | POA: Diagnosis not present

## 2021-06-01 ENCOUNTER — Encounter: Payer: Medicare Other | Admitting: Nurse Practitioner

## 2021-06-04 ENCOUNTER — Other Ambulatory Visit: Payer: Self-pay | Admitting: Nurse Practitioner

## 2021-06-04 DIAGNOSIS — I10 Essential (primary) hypertension: Secondary | ICD-10-CM

## 2021-06-08 DIAGNOSIS — Z23 Encounter for immunization: Secondary | ICD-10-CM | POA: Diagnosis not present

## 2021-06-12 ENCOUNTER — Other Ambulatory Visit: Payer: Self-pay | Admitting: Nurse Practitioner

## 2021-06-16 ENCOUNTER — Ambulatory Visit (INDEPENDENT_AMBULATORY_CARE_PROVIDER_SITE_OTHER): Payer: Medicare Other | Admitting: Nurse Practitioner

## 2021-06-16 ENCOUNTER — Other Ambulatory Visit: Payer: Self-pay

## 2021-06-16 ENCOUNTER — Encounter: Payer: Self-pay | Admitting: Nurse Practitioner

## 2021-06-16 ENCOUNTER — Telehealth: Payer: Self-pay

## 2021-06-16 DIAGNOSIS — Z0000 Encounter for general adult medical examination without abnormal findings: Secondary | ICD-10-CM | POA: Diagnosis not present

## 2021-06-16 NOTE — Telephone Encounter (Signed)
Ms. jenette, rayson are scheduled for a virtual visit with your provider today.    Just as we do with appointments in the office, we must obtain your consent to participate.  Your consent will be active for this visit and any virtual visit you may have with one of our providers in the next 365 days.    If you have a MyChart account, I can also send a copy of this consent to you electronically.  All virtual visits are billed to your insurance company just like a traditional visit in the office.  As this is a virtual visit, video technology does not allow for your provider to perform a traditional examination.  This may limit your provider's ability to fully assess your condition.  If your provider identifies any concerns that need to be evaluated in person or the need to arrange testing such as labs, EKG, etc, we will make arrangements to do so.    Although advances in technology are sophisticated, we cannot ensure that it will always work on either your end or our end.  If the connection with a video visit is poor, we may have to switch to a telephone visit.  With either a video or telephone visit, we are not always able to ensure that we have a secure connection.   I need to obtain your verbal consent now.   Are you willing to proceed with your visit today?   Bartolo Darter TREANNA DUMLER has provided verbal consent on 06/16/2021 for a virtual visit (video or telephone).   Carroll Kinds, [facility] 06/16/2021  3:49 PM

## 2021-06-16 NOTE — Progress Notes (Signed)
Subjective:   Caroline Snyder is a 80 y.o. female who presents for Medicare Annual (Subsequent) preventive examination.  Review of Systems     Cardiac Risk Factors include: advanced age (>54men, >26 women);hypertension;dyslipidemia     Objective:    There were no vitals filed for this visit. There is no height or weight on file to calculate BMI.  Advanced Directives 06/16/2021 02/04/2021 12/31/2020 10/16/2020 09/17/2020 06/13/2020 05/30/2020  Does Patient Have a Medical Advance Directive? No No No No No No No  Does patient want to make changes to medical advance directive? - - - - - - -  Copy of [place] in Chart? - - - - - - -  Would patient like information on creating a medical advance directive? - No - Patient declined No - Patient declined - No - Patient declined No - Patient declined -  Pre-existing out of facility DNR order (yellow form or pink MOST form) - - - - - - -    Current Medications (verified) Outpatient Encounter Medications as of 06/16/2021  Medication Sig   Ascorbic Acid (VITAMIN C) 1000 MG tablet Take 1,000 mg by mouth daily.   aspirin EC 81 MG tablet Take 1 tablet (81 mg total) by mouth daily. Swallow whole.   Calcium-Vitamins C & D (CALCIUM/C/D PO) Take by mouth.   gabapentin (NEURONTIN) 100 MG capsule Take 1 capsule (100 mg total) by mouth 3 (three) times daily.   hydrochlorothiazide (HYDRODIURIL) 25 MG tablet Take 1/2 tab = 12.5 mg orally daily   losartan (COZAAR) 100 MG tablet Take 1 tablet (100 mg total) by mouth daily. DX I10   Multiple Vitamin (MULTI VITAMIN DAILY PO) Take by mouth. Centrum Silver for Women Take 1 tablet daily   Multiple Vitamins-Minerals (ICAPS AREDS FORMULA PO) Take by mouth 2 (two) times daily.   rosuvastatin (CRESTOR) 20 MG tablet TAKE 1 TABLET BY MOUTH EVERY DAY   traZODone (DESYREL) 50 MG tablet TAKE 1 TABLET BY MOUTH EVERYDAY AT BEDTIME   Cholecalciferol 50 MCG (2000 UT) CAPS Take 1 tablet daily (Patient not  taking: Reported on 06/16/2021)   No facility-administered encounter medications on file as of 06/16/2021.    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 Dr.Orr    EYE SURGERY Bilateral 2010   cataract Dr. Bing Plume   VAGINAL HYSTERECTOMY  1980   Family History  Problem Relation Age of Onset   Alzheimer's disease Mother    Diabetes Mother    Transient ischemic attack Mother    Heart disease Mother        MI, CHF   Alcohol abuse Father    Kidney disease Father    Hypertension Sister    Hypertension Brother    Heart disease Brother    Hypertension Brother    Hypertension Sister  Hypertension Brother    Other Son        truck accident 07/2016   Colon cancer Neg Hx    Stomach cancer Neg Hx    Social History   Socioeconomic History   Marital status: Widowed    Spouse name: Not on file   Number of children: 2   Years of education: Not on file   Highest education level: Not on file  Occupational History   Occupation: Retired  Tobacco Use   Smoking status: Never   Smokeless tobacco: Never  Vaping Use   Vaping Use: Never used  Substance and Sexual Activity   Alcohol use: No    Alcohol/week: 0.0 standard drinks   Drug use: No   Sexual  activity: Not Currently    Comment: 1st intercourse- 16, partners- 2,   Other Topics Concern   Not on file  Social History Narrative   Not on file   Social Determinants of Health   Financial Resource Strain: Not on file  Food Insecurity: Not on file  Transportation Needs: Not on file  Physical Activity: Not on file  Stress: Not on file  Social Connections: Not on file    Tobacco Counseling Counseling given: Not Answered   Clinical Intake:  Pre-visit preparation completed: Yes  Pain : No/denies pain     BMI - recorded: 19 Nutritional Risks: None Diabetes: No  How often do you need to have someone help you when you read instructions, pamphlets, or other written materials from your doctor or pharmacy?: 1 - Never  Diabetic?no         Activities of Daily Living In your present state of health, do you have any difficulty performing the following activities: 06/16/2021  Hearing? N  Vision? N  Difficulty concentrating or making decisions? Y  Walking or climbing stairs? N  Dressing or bathing? N  Doing errands, shopping? N  Preparing Food and eating ? N  Using the Toilet? N  In the past six months, have you accidently leaked urine? N  Do you have problems with loss of bowel control? N  Managing your Medications? N  Managing your Finances? N  Housekeeping or managing your Housekeeping? N  Some recent data might be hidden    Patient Care Team: [name], NP as PCP - General (Geriatric Medicine) [name], MD as Consulting Physician (Ophthalmology) Calvert Cantor, MD as Consulting Physician (Ophthalmology) Terrance Mass, MD (Inactive) as Consulting Physician (Gynecology)  Indicate any recent Medical Services you may have received from other than [facility] providers in the past year (date may be approximate).     Assessment:   This is a routine wellness examination for Caroline Snyder.  Hearing/Vision screen Hearing Screening - Comments:: Patient has  no hearing problems. Vision Screening - Comments:: Patient wears reading glasses. Patient has not had a recent eye exam. Patient can't remember name  Dietary issues and exercise activities discussed: Current Exercise Habits: Home exercise routine, Type of exercise: walking, Time (Minutes): 30, Frequency (Times/Week): 6, Weekly Exercise (Minutes/Week): 180   Goals Addressed   None    Depression Screen PHQ 2/9 Scores 06/16/2021 09/17/2020 06/13/2020 04/25/2020 10/25/2019 06/13/2019 07/18/2018  PHQ - 2 Score 0 0 0 0 0 0 4  PHQ- 9 Score - - - - - - 19  Exception Documentation - - - - - - -  Not completed - - - - - - -    Fall Risk Fall Risk  06/16/2021 12/31/2020 10/16/2020 09/17/2020 06/13/2020  Falls in the past year? 0 0 0 0 0  Number falls in past yr: 0 0 0 0 0  Injury with Fall? 0 0 0 0 0  Risk for fall due to : No Fall Risks - - - -  Follow up Falls evaluation completed - - - -    FALL RISK PREVENTION PERTAINING TO THE HOME:  Any stairs in or around the home? No  If so, are there any without handrails? No  Home free of loose throw rugs in walkways, pet beds, electrical cords, etc? Yes  Adequate lighting in your home to reduce risk of falls? Yes   ASSISTIVE DEVICES UTILIZED TO PREVENT FALLS:  Life alert? No  Use of a cane, walker or w/c? No  Grab bars in the bathroom? No  Shower chair or bench in shower? No  Elevated toilet seat or a handicapped toilet? No   TIMED UP AND GO:  Was the test performed? No .    Cognitive Function: MMSE - Mini Mental State Exam 05/21/2020 06/09/2018 06/02/2017 05/28/2016  Orientation to time 5 5 4 5   Orientation to Place 3 5 5 5   Orientation to Place-comments Did not know the name of office or NP that she was seeing today - - -  Registration 3 3 3 3   Attention/ Calculation 5 5 5 5   Recall 0 0 0 2  Language- name 2 objects 2 2 2 2   Language- repeat 1 1 1 1   Language- follow 3 step command 3 3 3 3   Language- read & follow direction 1 1 1 1    Write a sentence 1 1 1 1   Copy design 1 1 1 1   Total score 25 27 26 29      6CIT Screen 06/16/2021 06/13/2020 06/13/2019  What Year? 0 points 0 points 0 points  What month? 0 points 0 points 0 points  What time? 0 points 0 points 0 points  Count back from 20 0 points 0 points 0 points  Months in reverse 4 points 2 points 2 points  Repeat phrase 10 points 0 points 4 points  Total Score 14 2 6     Immunizations Immunization History  Administered Date(s) Administered   Fluad Quad(high Dose 65+) 04/26/2019, 04/21/2020   Influenza, High Dose Seasonal PF 03/11/2017, 06/09/2018   Influenza,inj,Quad PF,6+ Mos 07/19/2013, 05/28/2016, 06/02/2017   Influenza,inj,quad, With Preservative 06/08/2021   Influenza-Unspecified 05/23/2015   PFIZER(Purple Top)SARS-COV-2 Vaccination 08/22/2019, 09/12/2019, 05/29/2020   PPD Test 11/30/2013, 05/30/2015   Pfizer Covid-19 Vaccine Bivalent Booster 21yrs & up 06/08/2021   Pneumococcal Conjugate-13 12/24/2014, 05/28/2016   Pneumococcal Polysaccharide-23 06/09/2018   Tdap 03/11/2017, 11/17/2018   Zoster, Live 11/19/2009    TDAP status: Up to date  Flu Vaccine status: Up to date  Pneumococcal vaccine status: Up to date  Covid-19 vaccine status: Completed vaccines  Qualifies for Shingles Vaccine? Yes   Zostavax completed Yes   Shingrix Completed?: No.    Education has been provided regarding the importance of this vaccine. Patient has been advised to call insurance company to determine out of pocket expense if they have not yet received this vaccine. Advised may also receive vaccine at local pharmacy or Health Dept. Verbalized acceptance and understanding.  Screening Tests Health Maintenance  Topic Date Due   Zoster Vaccines- Shingrix (1 of 2) Never done   MAMMOGRAM  06/30/2021   COLONOSCOPY (Pts 45-50yrs Insurance coverage will need to be confirmed)  05/13/2022   TETANUS/TDAP  11/16/2028   Pneumonia  Vaccine 12+ Years old  Completed   INFLUENZA  VACCINE  Completed   DEXA SCAN  Completed   COVID-19 Vaccine  Completed   HPV VACCINES  Aged Out    Health Maintenance  Health Maintenance Due  Topic Date Due   Zoster Vaccines- Shingrix (1 of 2) Never done    Colorectal cancer screening: No longer required.   Mammogram status: No longer required due to age.  Bone Density status: Completed 03/25/2021. Results reflect: Bone density results: OSTEOPOROSIS. Repeat every 2 years.  Lung Cancer Screening: (Low Dose CT Chest recommended if Age 94-80 years, 30 pack-year currently smoking OR have quit w/in 15years.) does not qualify.   Lung Cancer Screening Referral: na  Additional Screening:  Hepatitis C Screening: does not qualify; Completed   Vision Screening: Recommended annual ophthalmology exams for early detection of glaucoma and other disorders of the eye. Is the patient up to date with their annual eye exam?  No  Who is the provider or what is the name of the office in which the patient attends annual eye exams? [facility] If pt is not established with a provider, would they like to be referred to a provider to establish care? No .   Dental Screening: Recommended annual dental exams for proper oral hygiene  Community Resource Referral / Chronic Care Management: CRR required this visit?  No   CCM required this visit?  No      Plan:     I have personally reviewed and noted the following in the patient's chart:   Medical and social history Use of alcohol, tobacco or illicit drugs  Current medications and supplements including opioid prescriptions.  Functional ability and status Nutritional status Physical activity Advanced directives List of other physicians Hospitalizations, surgeries, and ER visits in previous 12 months Vitals Screenings to include cognitive, depression, and falls Referrals and appointments  In addition, I have reviewed and discussed with patient certain preventive protocols, quality metrics, and  best practice recommendations. A written personalized care plan for preventive services as well as general preventive health recommendations were provided to patient.     [name], NP   06/16/2021    Virtual Visit via Telephone Note  I connected withNAME@ on 06/16/21 at  3:45 PM EST by telephone and verified that I am speaking with the correct person using two identifiers.  Location: Patient: home  Provider: [facility]    I discussed the limitations, risks, security and privacy concerns of performing an evaluation and management service by telephone and the availability of in person appointments. I also discussed with the patient that there may be a patient responsible charge related to this service. The patient expressed understanding and agreed to proceed.   I discussed the assessment and treatment plan with the patient. The patient was provided an opportunity to ask questions and all were answered. The patient agreed with the plan and demonstrated an understanding of the instructions.   The patient was advised to call back or seek an in-person evaluation if the symptoms worsen or if the condition fails to improve as anticipated.  I provided 15 minutes of non-face-to-face time during this encounter.  Caroline Snyder. Caroline Snyder printed and mailed

## 2021-06-16 NOTE — Patient Instructions (Signed)
Caroline Snyder , Thank you for taking time to come for your Medicare Wellness Visit. I appreciate your ongoing commitment to your health goals. Please review the following plan we discussed and let me know if I can assist you in the future.   Screening recommendations/referrals: Colonoscopy aged out  Mammogram aged out Bone Density up to date Recommended yearly ophthalmology/optometry visit for glaucoma screening and checkup Recommended yearly dental visit for hygiene and checkup  Vaccinations: Influenza vaccine up to date Pneumococcal vaccine up to date Tdap vaccine up to date Shingles vaccine RECOMMENDED- to get at your local pharmacy    Advanced directives: recommended to complete and bring back to office to place on file.   Conditions/risks identified: advance age, progressive memory loss.   Next appointment: yearly for awv   Preventive Care 81 Years and Older, Female Preventive care refers to lifestyle choices and visits with your health care provider that can promote health and wellness. What does preventive care include? A yearly physical exam. This is also called an annual well check. Dental exams once or twice a year. Routine eye exams. Ask your health care provider how often you should have your eyes checked. Personal lifestyle choices, including: Daily care of your teeth and gums. Regular physical activity. Eating a healthy diet. Avoiding tobacco and drug use. Limiting alcohol use. Practicing safe sex. Taking low-dose aspirin every day. Taking vitamin and mineral supplements as recommended by your health care provider. What happens during an annual well check? The services and screenings done by your health care provider during your annual well check will depend on your age, overall health, lifestyle risk factors, and family history of disease. Counseling  Your health care provider may ask you questions about your: Alcohol use. Tobacco use. Drug use. Emotional  well-being. Home and relationship well-being. Sexual activity. Eating habits. History of falls. Memory and ability to understand (cognition). Work and work Statistician. Reproductive health. Screening  You may have the following tests or measurements: Height, weight, and BMI. Blood pressure. Lipid and cholesterol levels. These may be checked every 5 years, or more frequently if you are over 24 years old. Skin check. Lung cancer screening. You may have this screening every year starting at age 28 if you have a 30-pack-year history of smoking and currently smoke or have quit within the past 15 years. Fecal occult blood test (FOBT) of the stool. You may have this test every year starting at age 26. Flexible sigmoidoscopy or colonoscopy. You may have a sigmoidoscopy every 5 years or a colonoscopy every 10 years starting at age 50. Hepatitis C blood test. Hepatitis B blood test. Sexually transmitted disease (STD) testing. Diabetes screening. This is done by checking your blood sugar (glucose) after you have not eaten for a while (fasting). You may have this done every 1-3 years. Bone density scan. This is done to screen for osteoporosis. You may have this done starting at age 26. Mammogram. This may be done every 1-2 years. Talk to your health care provider about how often you should have regular mammograms. Talk with your health care provider about your test results, treatment options, and if necessary, the need for more tests. Vaccines  Your health care provider may recommend certain vaccines, such as: Influenza vaccine. This is recommended every year. Tetanus, diphtheria, and acellular pertussis (Tdap, Td) vaccine. You may need a Td booster every 10 years. Zoster vaccine. You may need this after age 16. Pneumococcal 13-valent conjugate (PCV13) vaccine. One dose is recommended after  age 35. Pneumococcal polysaccharide (PPSV23) vaccine. One dose is recommended after age 26. Talk to your  health care provider about which screenings and vaccines you need and how often you need them. This information is not intended to replace advice given to you by your health care provider. Make sure you discuss any questions you have with your health care provider. Document Released: 08/15/2015 Document Revised: 04/07/2016 Document Reviewed: 05/20/2015 Elsevier Interactive Patient Education  2017 [employer] Prevention in the Home Falls can cause injuries. They can happen to people of all ages. There are many things you can do to make your home safe and to help prevent falls. What can I do on the outside of my home? Regularly fix the edges of walkways and driveways and fix any cracks. Remove anything that might make you trip as you walk through a door, such as a raised step or threshold. Trim any bushes or trees on the path to your home. Use bright outdoor lighting. Clear any walking paths of anything that might make someone trip, such as rocks or tools. Regularly check to see if handrails are loose or broken. Make sure that both sides of any steps have handrails. Any raised decks and porches should have guardrails on the edges. Have any leaves, snow, or ice cleared regularly. Use sand or salt on walking paths during winter. Clean up any spills in your garage right away. This includes oil or grease spills. What can I do in the bathroom? Use night lights. Install grab bars by the toilet and in the tub and shower. Do not use towel bars as grab bars. Use non-skid mats or decals in the tub or shower. If you need to sit down in the shower, use a plastic, non-slip stool. Keep the floor dry. Clean up any water that spills on the floor as soon as it happens. Remove soap buildup in the tub or shower regularly. Attach bath mats securely with double-sided non-slip rug tape. Do not have throw rugs and other things on the floor that can make you trip. What can I do in the bedroom? Use night  lights. Make sure that you have a light by your bed that is easy to reach. Do not use any sheets or blankets that are too big for your bed. They should not hang down onto the floor. Have a firm chair that has side arms. You can use this for support while you get dressed. Do not have throw rugs and other things on the floor that can make you trip. What can I do in the kitchen? Clean up any spills right away. Avoid walking on wet floors. Keep items that you use a lot in easy-to-reach places. If you need to reach something above you, use a strong step stool that has a grab bar. Keep electrical cords out of the way. Do not use floor polish or wax that makes floors slippery. If you must use wax, use non-skid floor wax. Do not have throw rugs and other things on the floor that can make you trip. What can I do with my stairs? Do not leave any items on the stairs. Make sure that there are handrails on both sides of the stairs and use them. Fix handrails that are broken or loose. Make sure that handrails are as long as the stairways. Check any carpeting to make sure that it is firmly attached to the stairs. Fix any carpet that is loose or worn. Avoid having throw rugs  at the top or bottom of the stairs. If you do have throw rugs, attach them to the floor with carpet tape. Make sure that you have a light switch at the top of the stairs and the bottom of the stairs. If you do not have them, ask someone to add them for you. What else can I do to help prevent falls? Wear shoes that: Do not have high heels. Have rubber bottoms. Are comfortable and fit you well. Are closed at the toe. Do not wear sandals. If you use a stepladder: Make sure that it is fully opened. Do not climb a closed stepladder. Make sure that both sides of the stepladder are locked into place. Ask someone to hold it for you, if possible. Clearly mark and make sure that you can see: Any grab bars or handrails. First and last  steps. Where the edge of each step is. Use tools that help you move around (mobility aids) if they are needed. These include: Canes. Walkers. Scooters. Crutches. Turn on the lights when you go into a dark area. Replace any light bulbs as soon as they burn out. Set up your furniture so you have a clear path. Avoid moving your furniture around. If any of your floors are uneven, fix them. If there are any pets around you, be aware of where they are. Review your medicines with your doctor. Some medicines can make you feel dizzy. This can increase your chance of falling. Ask your doctor what other things that you can do to help prevent falls. This information is not intended to replace advice given to you by your health care provider. Make sure you discuss any questions you have with your health care provider. Document Released: 05/15/2009 Document Revised: 12/25/2015 Document Reviewed: 08/23/2014 Elsevier Interactive Patient Education  2017 [employer].

## 2021-06-16 NOTE — Progress Notes (Signed)
This service is provided via telemedicine  No vital signs collected/recorded due to the encounter was a telemedicine visit.   Location of patient (ex: home, work):  Home  Patient consents to a telephone visit:  Yes, see encounter dated 06/16/2021  Location of the provider (ex: office, home):  [place]  Name of any referring provider:  N/A  Names of all persons participating in the telemedicine service and their role in the encounter:  Sherrie Mustache, Nurse Practitioner, Carroll Kinds, CMA, and patient.   Time spent on call:  12 minutes with medical assistant

## 2021-07-01 ENCOUNTER — Other Ambulatory Visit: Payer: Self-pay | Admitting: Nurse Practitioner

## 2021-07-01 DIAGNOSIS — I10 Essential (primary) hypertension: Secondary | ICD-10-CM

## 2021-07-02 ENCOUNTER — Other Ambulatory Visit: Payer: Self-pay | Admitting: Adult Health

## 2021-07-02 DIAGNOSIS — I10 Essential (primary) hypertension: Secondary | ICD-10-CM

## 2021-07-06 DIAGNOSIS — Z1231 Encounter for screening mammogram for malignant neoplasm of breast: Secondary | ICD-10-CM | POA: Diagnosis not present

## 2021-07-06 LAB — HM MAMMOGRAPHY

## 2021-07-08 ENCOUNTER — Other Ambulatory Visit: Payer: Self-pay | Admitting: Nurse Practitioner

## 2021-07-08 DIAGNOSIS — I10 Essential (primary) hypertension: Secondary | ICD-10-CM

## 2021-07-11 ENCOUNTER — Other Ambulatory Visit: Payer: Self-pay | Admitting: Nurse Practitioner

## 2021-07-11 DIAGNOSIS — I10 Essential (primary) hypertension: Secondary | ICD-10-CM

## 2021-08-07 DIAGNOSIS — R519 Headache, unspecified: Secondary | ICD-10-CM | POA: Diagnosis not present

## 2021-08-07 DIAGNOSIS — H353122 Nonexudative age-related macular degeneration, left eye, intermediate dry stage: Secondary | ICD-10-CM | POA: Diagnosis not present

## 2021-08-07 DIAGNOSIS — H52223 Regular astigmatism, bilateral: Secondary | ICD-10-CM | POA: Diagnosis not present

## 2021-08-07 DIAGNOSIS — Z961 Presence of intraocular lens: Secondary | ICD-10-CM | POA: Diagnosis not present

## 2021-08-07 DIAGNOSIS — H04123 Dry eye syndrome of bilateral lacrimal glands: Secondary | ICD-10-CM | POA: Diagnosis not present

## 2021-08-08 ENCOUNTER — Other Ambulatory Visit: Payer: Self-pay | Admitting: Nurse Practitioner

## 2021-08-08 DIAGNOSIS — I10 Essential (primary) hypertension: Secondary | ICD-10-CM

## 2021-08-31 ENCOUNTER — Ambulatory Visit (INDEPENDENT_AMBULATORY_CARE_PROVIDER_SITE_OTHER): Payer: Medicare Other | Admitting: Nurse Practitioner

## 2021-08-31 ENCOUNTER — Encounter: Payer: Self-pay | Admitting: Nurse Practitioner

## 2021-08-31 ENCOUNTER — Other Ambulatory Visit: Payer: Self-pay

## 2021-08-31 VITALS — BP 124/78 | HR 75 | Temp 97.3°F | Ht 61.0 in | Wt 107.0 lb

## 2021-08-31 DIAGNOSIS — M810 Age-related osteoporosis without current pathological fracture: Secondary | ICD-10-CM

## 2021-08-31 DIAGNOSIS — E782 Mixed hyperlipidemia: Secondary | ICD-10-CM

## 2021-08-31 DIAGNOSIS — I10 Essential (primary) hypertension: Secondary | ICD-10-CM | POA: Diagnosis not present

## 2021-08-31 DIAGNOSIS — G4700 Insomnia, unspecified: Secondary | ICD-10-CM | POA: Diagnosis not present

## 2021-08-31 DIAGNOSIS — M159 Polyosteoarthritis, unspecified: Secondary | ICD-10-CM

## 2021-08-31 DIAGNOSIS — F339 Major depressive disorder, recurrent, unspecified: Secondary | ICD-10-CM | POA: Diagnosis not present

## 2021-08-31 DIAGNOSIS — F0150 Vascular dementia without behavioral disturbance: Secondary | ICD-10-CM | POA: Diagnosis not present

## 2021-08-31 LAB — CBC WITH DIFFERENTIAL/PLATELET
Absolute Monocytes: 431 cells/uL (ref 200–950)
Basophils Absolute: 67 cells/uL (ref 0–200)
Basophils Relative: 1.2 %
Eosinophils Absolute: 90 cells/uL (ref 15–500)
Eosinophils Relative: 1.6 %
HCT: 37.4 % (ref 35.0–45.0)
Hemoglobin: 12.7 g/dL (ref 11.7–15.5)
Lymphs Abs: 1478 cells/uL (ref 850–3900)
MCH: 31.1 pg (ref 27.0–33.0)
MCHC: 34 g/dL (ref 32.0–36.0)
MCV: 91.7 fL (ref 80.0–100.0)
MPV: 11 fL (ref 7.5–12.5)
Monocytes Relative: 7.7 %
Neutro Abs: 3534 cells/uL (ref 1500–7800)
Neutrophils Relative %: 63.1 %
Platelets: 291 10*3/uL (ref 140–400)
RBC: 4.08 10*6/uL (ref 3.80–5.10)
RDW: 12.2 % (ref 11.0–15.0)
Total Lymphocyte: 26.4 %
WBC: 5.6 10*3/uL (ref 3.8–10.8)

## 2021-08-31 LAB — LIPID PANEL
Cholesterol: 217 mg/dL — ABNORMAL HIGH (ref <200)
HDL: 72 mg/dL (ref >=50)
LDL Cholesterol (Calc): 120 mg/dL (calc) — ABNORMAL HIGH
Non-HDL Cholesterol (Calc): 145 mg/dL (calc) — ABNORMAL HIGH (ref <130)
Total CHOL/HDL Ratio: 3 (calc) (ref <5.0)
Triglycerides: 137 mg/dL (ref <150)

## 2021-08-31 LAB — COMPLETE METABOLIC PANEL WITH GFR
AG Ratio: 2.1 (calc) (ref 1.0–2.5)
ALT: 16 U/L (ref 6–29)
AST: 21 U/L (ref 10–35)
Albumin: 4.7 g/dL (ref 3.6–5.1)
Alkaline phosphatase (APISO): 54 U/L (ref 37–153)
BUN: 19 mg/dL (ref 7–25)
CO2: 33 mmol/L — ABNORMAL HIGH (ref 20–32)
Calcium: 10.3 mg/dL (ref 8.6–10.4)
Chloride: 103 mmol/L (ref 98–110)
Creat: 0.92 mg/dL (ref 0.60–0.95)
Globulin: 2.2 g/dL (calc) (ref 1.9–3.7)
Glucose, Bld: 85 mg/dL (ref 65–99)
Potassium: 4.4 mmol/L (ref 3.5–5.3)
Sodium: 141 mmol/L (ref 135–146)
Total Bilirubin: 0.4 mg/dL (ref 0.2–1.2)
Total Protein: 6.9 g/dL (ref 6.1–8.1)
eGFR: 63 mL/min/{1.73_m2} (ref >=60)

## 2021-08-31 MED ORDER — DENOSUMAB 60 MG/ML ~~LOC~~ SOSY
60.0000 mg | PREFILLED_SYRINGE | Freq: Once | SUBCUTANEOUS | Status: AC
  Administered 2021-08-31: 60 mg via SUBCUTANEOUS

## 2021-08-31 NOTE — Patient Instructions (Addendum)
Recommended to take calcium 600 mg twice daily with Vitamin D 2000 units daily and weight bearing activity 30 mins/5 days a week  Add benefiber daily to help bulk stool Increase in fiber in the diet.

## 2021-08-31 NOTE — Progress Notes (Signed)
Careteam: [name], NP as PCP - General (Geriatric Medicine) [name], MD as Consulting Physician (Ophthalmology) [name], MD as Consulting Physician (Ophthalmology) [name], MD (Inactive) as Consulting Physician (Gynecology)  PLACE OF SERVICE:  [facility]  Advanced Directive information Does Patient Have a Medical Advance Directive?: No, Would patient like information on creating a medical advance directive?: Yes (MAU/Ambulatory/Procedural Areas - Information given) (Has paperwork at home)  Allergies  Allergen Reactions   Vioxx [Rofecoxib] Nausea Only    Pt. Can't remember what reaction was but asks to leave on her profile.     Chief Complaint  Patient presents with   Medical Management of Chronic Issues    4 month follow-up and prolia injection (would like to discuss prior to injection). Patient is not 100 % sure of medications. Discuss vit D and calcium recommendations (recommended per Prolia guidelines). Discuss low bloodsugar. Patient c/o leg pain.  Here with sister, Bonita Quin.      HPI: Patient is a 81 y.o. female for routine follow up.   Pt with came with concerns of Prolia as well as calcium, vit C, and D supplementation on medication list. Pt would like to know the correct dose recommended for supplements .   Pt is still having left leg pain.  Mostly has issues with pain and stiffness in the morning. Takes gabapentin 100 mg QD, as well as tylenol. States medication has been helping a lot. She has not been as active.  Patient use to go dancing 3 times a week. Has not been dancing since august 2022. Has noticed leg pain started after reducing exercise routine.    Pt often skips meals or forgets to eat. Feels like her blood sugar gets low  Insomnia- sleeps great now. Taking trazodone 50 mg HS.  Very forgetful. Staying with her sister.    Review of Systems:  Review of Systems  Constitutional:  Negative for chills and  fever.  Respiratory:  Negative for cough and shortness of breath.   Cardiovascular:  Negative for chest pain, palpitations, orthopnea and leg swelling.  Gastrointestinal:  Negative for abdominal pain, constipation, diarrhea, nausea and vomiting.  Genitourinary: Negative.   Musculoskeletal:        Still having left leg pain, but not as severe   Neurological:  Positive for headaches. Negative for dizziness, tingling and weakness.       Takes tylenol for H/A  Psychiatric/Behavioral:  Positive for memory loss. Negative for depression. The patient does not have insomnia.    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   Bunion 07/01/2010   Depression 05/05/2007   External hemorrhoids without mention of complication 03/24/1999   GERD (gastroesophageal reflux disease) 2000   Hyperlipidemia 06/29/2006   Memory loss 04/15/2003   Migraine without aura, without mention of intractable migraine without mention of status migrainosus 03/24/1999   Osteoporosis, unspecified 04/15/2004   Tear film insufficiency, unspecified 03/24/1999   Unspecified essential hypertension 11/22/2007   Past Surgical History:  Procedure Laterality Date   COLONOSCOPY  06-16-2007   Internal hemorrhoids,laxity of anal sphincter,polyp at 25cm form anal verge, tortuous sigmoid colon. Dr.Orr    EYE SURGERY Bilateral 2010   cataract Dr. Hazle Quant   VAGINAL HYSTERECTOMY  1980   Social History:   reports that she has never smoked. She has never used smokeless tobacco. She reports that she does not drink alcohol and does not use  drugs.  Family History  Problem Relation Age of Onset   Alzheimer's disease Mother    Diabetes Mother    Transient ischemic attack Mother    Heart disease Mother        MI, CHF   Alcohol abuse Father    Kidney disease Father    Hypertension Sister    Hypertension Brother    Heart disease Brother    Hypertension Brother    Hypertension Sister     Hypertension Brother    Other Son        truck accident 07/2016   Colon cancer Neg Hx    Stomach cancer Neg Hx     Medications: Patient's Medications  New Prescriptions   No medications on file  Previous Medications   ASCORBIC ACID (VITAMIN C) 1000 MG TABLET    Take 1,000 mg by mouth daily.   ASPIRIN EC 81 MG TABLET    Take 1 tablet (81 mg total) by mouth daily. Swallow whole.   CALCIUM-VITAMINS C & D (CALCIUM/C/D PO)    Take by mouth.   CHOLECALCIFEROL 50 MCG (2000 UT) CAPS    Take 1 tablet daily   GABAPENTIN (NEURONTIN) 100 MG CAPSULE    Take 1 capsule (100 mg total) by mouth 3 (three) times daily.   HYDROCHLOROTHIAZIDE (HYDRODIURIL) 25 MG TABLET    TAKE 1/2 TABLET BY MOUHT ONCE DAILY   LOSARTAN (COZAAR) 100 MG TABLET    TAKE 1 TABLET (100 MG TOTAL) BY MOUTH DAILY. DX I10   MULTIPLE VITAMIN (MULTI VITAMIN DAILY PO)    Take by mouth. Centrum Silver for Women Take 1 tablet daily   MULTIPLE VITAMINS-MINERALS (ICAPS AREDS FORMULA PO)    Take by mouth 2 (two) times daily.   ROSUVASTATIN (CRESTOR) 20 MG TABLET    TAKE 1 TABLET BY MOUTH EVERY DAY   TRAZODONE (DESYREL) 50 MG TABLET    TAKE 1 TABLET BY MOUTH EVERYDAY AT BEDTIME  Modified Medications   No medications on file  Discontinued Medications   No medications on file    Physical Exam:  Vitals:   08/31/21 1029  BP: 124/78  Pulse: 75  Temp: (!) 97.3 F (36.3 C)  TempSrc: Temporal  SpO2: 97%  Weight: 107 lb (48.5 kg)  Height: $Remove'5\' 1"'TMmKZwD$  (1.549 m)   Body mass index is 20.22 kg/m. Wt Readings from Last 3 Encounters:  08/31/21 107 lb (48.5 kg)  05/04/21 104 lb 12.8 oz (47.5 kg)  02/04/21 105 lb 9.6 oz (47.9 kg)    Physical Exam Constitutional:      General: She is not in acute distress.    Appearance: Normal appearance. She is normal weight. She is not ill-appearing.  HENT:     Right Ear: Tympanic membrane, ear canal and external ear normal.     Left Ear: Tympanic membrane and external ear normal.     Nose: Nose normal.      Mouth/Throat:     Mouth: Mucous membranes are moist.     Pharynx: Oropharynx is clear.  Eyes:     Conjunctiva/sclera: Conjunctivae normal.  Cardiovascular:     Rate and Rhythm: Normal rate and regular rhythm.     Pulses: Normal pulses.     Heart sounds: Normal heart sounds.  Pulmonary:     Effort: Pulmonary effort is normal. No respiratory distress.     Breath sounds: Normal breath sounds.  Abdominal:     General: Bowel sounds are normal.  Musculoskeletal:  General: Normal range of motion.     Cervical back: Normal range of motion.  Neurological:     Mental Status: She is alert and oriented to person, place, and time.     Comments: Patient is forgetful   Psychiatric:        Mood and Affect: Mood normal.        Thought Content: Thought content normal.        Judgment: Judgment normal.    Labs reviewed: Basic Metabolic Panel: Recent Labs    09/17/20 1109 10/16/20 1551 01/12/21 1552  NA 141 142 139  K 4.6 4.0 4.1  CL 104 104 101  CO2 $Re'29 30 28  'vZX$ GLUCOSE 94 76 94  BUN $Re'17 19 17  'Myd$ CREATININE 0.82 0.77 0.93*  CALCIUM 9.9 10.6* 10.0   Liver Function Tests: Recent Labs    09/17/20 1109 01/12/21 1552  AST 30 24  ALT 25 16  BILITOT 0.3 0.3  PROT 6.9 7.2   No results for input(s): LIPASE, AMYLASE in the last 8760 hours. No results for input(s): AMMONIA in the last 8760 hours. CBC: Recent Labs    09/17/20 1109 10/16/20 1551 01/12/21 1552  WBC 5.3 6.4 5.7  NEUTROABS 3,159 4,390 3,141  HGB 12.8 12.8 13.2  HCT 37.9 37.2 38.1  MCV 91.3 90.1 90.7  PLT 277 270 302   Lipid Panel: Recent Labs    09/17/20 1109  CHOL 202*  HDL 60  LDLCALC 112*  TRIG 178*  CHOLHDL 3.4   TSH: No results for input(s): TSH in the last 8760 hours. A1C: No results found for: HGBA1C   Assessment/Plan   1. Age-related osteoporosis without current pathological fracture - Dual X-ray absorptiometry done 03/25/21. T-score of -3.0 - denosumab (PROLIA) injection 60 mg  administered during this visit -Recommended to take calcium 600 mg twice daily with Vitamin D 2000 units daily and weight bearing activity 30 mins/5 days a week  2. Depression, recurrent ([place]) -stable, denies depression today. Lifestyle modification encouraged. - Encourage to do physical activity such as walking, dancing, and staying active running errands.  -On trazodone $RemoveBefor'50mg'fSPjncNCtxVN$  HS   3. Mixed hyperlipidemia -On Crestor  and does dietary modification  - Lipid panel - CMP with eGFR([facility])  4. Essential hypertension -Well controlled -Takes HCTZ, and losartan, continue medication and lifestyle modifications.  - CMP with eGFR([facility]) - CBC with Differential/Platelet  5. Vascular dementia without behavioral disturbance ([place]) -Patient is forgetful has tried Aricept but could not tolerate due to side effects -Pt is encouraged to do memory exercises such as a reading to keep mind active  -Stable, no acute changes in cognitive or functional status, continue supportive care.   6. Insomnia -No issues with sleeping , takes Trazodone 50 mg HS  7. Primary osteoarthritis involving multiple joints. - Osteoarthritis has not caused any issue with mobility but patient notices feeling more stiff in the mornings and when sitting down for too long. After moving more, the pain decreases.  - Weight bearing exercise encouraged -Pt takes tylenol   Next appt: Return in about 4 months (around 12/29/2021) for routine follow up.  I personally was present during the history, physical exam and medical decision-making activities of this service and have verified that the service and findings are accurately documented in the students note  Janett Billow K. [place], [facility] [phone]

## 2021-09-07 ENCOUNTER — Telehealth: Payer: Self-pay | Admitting: Nurse Practitioner

## 2021-09-07 NOTE — Telephone Encounter (Signed)
Pt has called referral line vm x 3 on Monday & x 2 on Friday (09/04/21) stating someone is trying to reach her.  Everytime I return the call at [phone] provided, it goes to vm. If you are aware of anyone trying to reach Ms Bandel regarding clinical info, please share.  Thanks, [name]

## 2021-09-09 NOTE — Telephone Encounter (Signed)
Logan Bores, [place]  09/09/2021  9:14 AM EST Back to Top    Call placed to patient, voicemail if full and unable to accept messages at this time. Call placed to patients son Tommie Raymond, left message on voicemail requesting that he have his mother return our call when available

## 2021-10-06 ENCOUNTER — Other Ambulatory Visit: Payer: Self-pay | Admitting: Orthopedic Surgery

## 2021-10-06 DIAGNOSIS — M25552 Pain in left hip: Secondary | ICD-10-CM

## 2021-11-17 ENCOUNTER — Encounter: Payer: Self-pay | Admitting: Family

## 2021-11-17 ENCOUNTER — Ambulatory Visit: Payer: Medicare Other | Admitting: Family

## 2021-11-17 ENCOUNTER — Ambulatory Visit
Admission: RE | Admit: 2021-11-17 | Discharge: 2021-11-17 | Disposition: A | Discharge status: Home / Self Care | Payer: Medicare Other | Source: Ambulatory Visit | Attending: Family | Admitting: Family

## 2021-11-17 ENCOUNTER — Ambulatory Visit (INDEPENDENT_AMBULATORY_CARE_PROVIDER_SITE_OTHER): Payer: Medicare Other | Admitting: Family

## 2021-11-17 VITALS — BP 122/70 | HR 76 | Temp 97.6°F | Resp 16 | Ht 61.0 in | Wt 107.8 lb

## 2021-11-17 DIAGNOSIS — M5441 Lumbago with sciatica, right side: Secondary | ICD-10-CM | POA: Diagnosis not present

## 2021-11-17 DIAGNOSIS — M47816 Spondylosis without myelopathy or radiculopathy, lumbar region: Secondary | ICD-10-CM | POA: Diagnosis not present

## 2021-11-17 DIAGNOSIS — G8929 Other chronic pain: Secondary | ICD-10-CM | POA: Diagnosis not present

## 2021-11-17 DIAGNOSIS — M545 Low back pain, unspecified: Secondary | ICD-10-CM | POA: Diagnosis not present

## 2021-11-17 MED ORDER — ACETAMINOPHEN 500 MG PO TABS: 1000.0000 mg | ORAL_TABLET | Freq: Two times a day (BID) | ORAL | 0 refills | Status: AC

## 2021-11-17 NOTE — Progress Notes (Signed)
? ?Provider: Marlowe Sax FNP-C ? ?[name], NP ? ?Patient Care Team: ?[name], NP as PCP - General (Geriatric Medicine) ?[name], MD as Consulting Physician (Ophthalmology) ?Calvert Cantor, MD as Consulting Physician (Ophthalmology) ?Terrance Mass, MD (Inactive) as Consulting Physician (Gynecology) ? ?Extended Emergency Contact Information ?Primary Emergency Contact: McLamb,Randall ? [place] ?Home Phone: [phone] ?Relation: Son ?Secondary Emergency Contact: McLamb,Nicki ?Home Phone: [phone] ?Relation: Other ? ?Code Status:  Full Code  ?Goals of care: Advanced Directive information ? ?  11/17/2021  ?  2:46 PM  ?Advanced Directives  ?Does Patient Have a Medical Advance Directive? No  ?Would patient like information on creating a medical advance directive? No - Patient declined  ? ? ? ?Chief Complaint  ?Patient presents with  ? Acute Visit  ?  Patient complains of lower back pain for two days.   ? ? ?HPI:  ?Pt is a 81 y.o. female seen today for an acute visit for evaluation of lower back pain x 2 days.Pain is worst when she first wakes up in the morning.Pain is located on the right lower back and right buttock area.Pain gets relieved with walking.  She denies any fall episode or injuries to back. ?She denies any weakness,numbness or tingling on the extremities. ?HPI information limited due to patient's being poor historian sometimes reports that pain started 2 days ago then another time she states has been going on for several years. ? ? ? ?Past Medical History:  ?Diagnosis Date  ? Allergy   ? Benign neoplasm of colon 02/11/2012  ? Bunion 07/01/2010  ? Depression 05/05/2007  ? External hemorrhoids without mention of complication 70/35/0093  ? GERD (gastroesophageal reflux disease) 2000  ? Hyperlipidemia 06/29/2006  ? Memory loss 04/15/2003  ? Migraine without aura, without mention of intractable migraine without mention of status migrainosus 03/24/1999  ?  Osteoporosis, unspecified 04/15/2004  ? Tear film insufficiency, unspecified 03/24/1999  ? Unspecified essential hypertension 11/22/2007  ? ?Past Surgical History:  ?Procedure Laterality Date  ? COLONOSCOPY  06-16-2007  ? Internal hemorrhoids,laxity of anal sphincter,polyp at 25cm form anal verge, tortuous sigmoid colon. Dr.Orr   ? EYE SURGERY Bilateral 2010  ? cataract Dr. Bing Plume  ? VAGINAL HYSTERECTOMY  1980  ? ? ?Allergies  ?Allergen Reactions  ? Vioxx [Rofecoxib] Nausea Only  ?  Pt. Can't remember what reaction was but asks to leave on her profile.   ? ? ?Outpatient Encounter Medications as of 11/17/2021  ?Medication Sig  ? Ascorbic Acid (VITAMIN C) 1000 MG tablet Take 1,000 mg by mouth daily.  ? aspirin EC 81 MG tablet Take 1 tablet (81 mg total) by mouth daily. Swallow whole.  ? Calcium-Vitamins C & D (CALCIUM/C/D PO) Take by mouth.  ? Cholecalciferol 50 MCG (2000 UT) CAPS Take 1 tablet daily  ? gabapentin (NEURONTIN) 100 MG capsule TAKE 1 CAPSULE (100 MG TOTAL) BY MOUTH THREE TIMES DAILY.  ? hydrochlorothiazide (HYDRODIURIL) 25 MG tablet TAKE 1/2 TABLET BY MOUHT ONCE DAILY  ? losartan (COZAAR) 100 MG tablet TAKE 1 TABLET (100 MG TOTAL) BY MOUTH DAILY. DX I10  ? Multiple Vitamin (MULTI VITAMIN DAILY PO) Take by mouth. Centrum Silver for Women ?Take 1 tablet daily  ? Multiple Vitamins-Minerals (ICAPS AREDS FORMULA PO) Take by mouth 2 (two) times daily.  ? rosuvastatin (CRESTOR) 20 MG tablet TAKE 1 TABLET BY MOUTH EVERY DAY  ? traZODone (DESYREL) 50 MG tablet TAKE 1 TABLET BY MOUTH EVERYDAY AT BEDTIME  ? ?No  facility-administered encounter medications on file as of 11/17/2021.  ? ? ?Review of Systems  ?Constitutional:  Negative for appetite change, chills, fatigue, fever and unexpected weight change.  ?Respiratory:  Negative for cough, chest tightness, shortness of breath and wheezing.   ?Cardiovascular:  Negative for chest pain, palpitations and leg swelling.  ?Gastrointestinal:  Negative for abdominal distention,  abdominal pain and constipation.  ?Genitourinary:  Negative for difficulty urinating, dysuria, flank pain, frequency and urgency.  ?Musculoskeletal:  Positive for arthralgias and back pain. Negative for gait problem, joint swelling, myalgias, neck pain and neck stiffness.  ?     Right-sided lower back pain down right leg  ?Skin:  Negative for color change, pallor and rash.  ?Neurological:  Negative for dizziness, weakness, light-headedness, numbness and headaches.  ?Hematological:  Does not bruise/bleed easily.  ? ?Immunization History  ?Administered Date(s) Administered  ? Fluad Quad(high Dose 65+) 04/26/2019, 04/21/2020  ? Influenza, High Dose Seasonal PF 03/11/2017, 06/09/2018  ? Influenza,inj,Quad PF,6+ Mos 07/19/2013, 05/28/2016, 06/02/2017  ? Influenza,inj,quad, With Preservative 06/08/2021  ? Influenza-Unspecified 05/23/2015  ? PFIZER(Purple Top)SARS-COV-2 Vaccination 08/22/2019, 09/12/2019, 05/29/2020  ? PPD Test 11/30/2013, 05/30/2015  ? Pension scheme manager 53yr & up 06/08/2021  ? Pneumococcal Conjugate-13 12/24/2014, 05/28/2016  ? Pneumococcal Polysaccharide-23 06/09/2018  ? Tdap 03/11/2017, 11/17/2018  ? Zoster, Live 11/19/2009  ? ?Pertinent  Health Maintenance Due  ?Topic Date Due  ? INFLUENZA VACCINE  03/02/2022  ? COLONOSCOPY (Pts 45-447yrInsurance coverage will need to be confirmed)  05/13/2022  ? MAMMOGRAM  07/06/2022  ? DEXA SCAN  Completed  ? ? ?  10/16/2020  ?  2:33 PM 12/31/2020  ?  3:36 PM 06/16/2021  ?  3:43 PM 08/31/2021  ? 10:32 AM 11/17/2021  ?  2:46 PM  ?Fall Risk  ?Falls in the past year? 0 0 0 0 0  ?Was there an injury with Fall? 0 0 0 0 0  ?Fall Risk Category Calculator 0 0 0 0 0  ?Fall Risk Category Low Low Low Low Low  ?Patient Fall Risk Level Low fall risk Low fall risk Low fall risk Low fall risk Low fall risk  ?Patient at Risk for Falls Due to   No Fall Risks No Fall Risks No Fall Risks  ?Fall risk Follow up   Falls evaluation completed Falls evaluation completed  Falls evaluation completed  ? ?Functional Status Survey: ?  ? ?Vitals:  ? 11/17/21 1443  ?BP: 122/70  ?Pulse: 76  ?Resp: 16  ?Temp: 97.6 ?F (36.4 ?C)  ?SpO2: 97%  ?Weight: 107 lb 12.8 oz (48.9 kg)  ?Height: '5\' 1"'$  (1.549 m)  ? ?Body mass index is 20.37 kg/m?. Marland KitchenPhysical Exam ?Vitals reviewed.  ?Constitutional:   ?   General: She is not in acute distress. ?   Appearance: Normal appearance. She is normal weight. She is not ill-appearing or diaphoretic.  ?HENT:  ?   Head: Normocephalic.  ?Eyes:  ?   General: No scleral icterus.    ?   Right eye: No discharge.     ?   Left eye: No discharge.  ?   Conjunctiva/sclera: Conjunctivae normal.  ?   Pupils: Pupils are equal, round, and reactive to light.  ?Neck:  ?   Vascular: No carotid bruit.  ?Cardiovascular:  ?   Rate and Rhythm: Normal rate and regular rhythm.  ?   Pulses: Normal pulses.  ?   Heart sounds: Normal heart sounds. No murmur heard. ?  No friction rub.  No gallop.  ?Pulmonary:  ?   Effort: Pulmonary effort is normal. No respiratory distress.  ?   Breath sounds: Normal breath sounds. No wheezing, rhonchi or rales.  ?Chest:  ?   Chest wall: No tenderness.  ?Abdominal:  ?   General: Bowel sounds are normal. There is no distension.  ?   Palpations: Abdomen is soft. There is no mass.  ?   Tenderness: There is no abdominal tenderness. There is no right CVA tenderness, left CVA tenderness, guarding or rebound.  ?Musculoskeletal:     ?   General: No swelling or tenderness. Normal range of motion.  ?   Cervical back: Normal range of motion. No rigidity or tenderness.  ?   Lumbar back: No swelling, edema or tenderness. Normal range of motion. Negative right straight leg raise test and negative left straight leg raise test.  ?   Right lower leg: No edema.  ?   Left lower leg: No edema.  ?Lymphadenopathy:  ?   Cervical: No cervical adenopathy.  ?Skin: ?   General: Skin is warm and dry.  ?   Coloration: Skin is not pale.  ?   Findings: No erythema or rash.  ?Neurological:  ?    Mental Status: She is alert and oriented to person, place, and time.  ?   Sensory: No sensory deficit.  ?   Motor: No weakness.  ?   Gait: Gait normal.  ?Psychiatric:     ?   Mood and Affect: Mood normal.

## 2021-11-17 NOTE — Patient Instructions (Signed)
-   Please get lower back X-ray at [facility] at [address] then will call you with results.  

## 2021-11-20 ENCOUNTER — Other Ambulatory Visit: Payer: Self-pay

## 2021-11-20 DIAGNOSIS — M419 Scoliosis, unspecified: Secondary | ICD-10-CM

## 2021-11-20 DIAGNOSIS — M1990 Unspecified osteoarthritis, unspecified site: Secondary | ICD-10-CM

## 2021-11-27 ENCOUNTER — Encounter: Payer: Self-pay | Admitting: Surgical

## 2021-11-27 ENCOUNTER — Ambulatory Visit (INDEPENDENT_AMBULATORY_CARE_PROVIDER_SITE_OTHER): Payer: Medicare Other | Admitting: Surgical

## 2021-11-27 DIAGNOSIS — M47816 Spondylosis without myelopathy or radiculopathy, lumbar region: Secondary | ICD-10-CM

## 2021-11-27 DIAGNOSIS — M5410 Radiculopathy, site unspecified: Secondary | ICD-10-CM | POA: Diagnosis not present

## 2021-11-27 MED ORDER — PREDNISONE 5 MG (21) PO TBPK: ORAL_TABLET | ORAL | 0 refills | Status: DC

## 2021-11-27 NOTE — Addendum Note (Signed)
Addended byLaurann Montana on: 11/27/2021 10:36 AM ? ? Modules accepted: Orders ? ?
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
## 2021-12-05 DIAGNOSIS — J209 Acute bronchitis, unspecified: Secondary | ICD-10-CM | POA: Diagnosis not present

## 2021-12-08 ENCOUNTER — Encounter: Payer: Self-pay | Admitting: Physical Therapy

## 2021-12-08 ENCOUNTER — Ambulatory Visit (INDEPENDENT_AMBULATORY_CARE_PROVIDER_SITE_OTHER): Payer: Medicare Other | Admitting: Physical Therapy

## 2021-12-08 DIAGNOSIS — M5459 Other low back pain: Secondary | ICD-10-CM | POA: Diagnosis not present

## 2021-12-08 DIAGNOSIS — M6281 Muscle weakness (generalized): Secondary | ICD-10-CM | POA: Diagnosis not present

## 2021-12-08 NOTE — Therapy (Addendum)
OUTPATIENT PHYSICAL THERAPY THORACOLUMBAR EVALUATION DISCHARGE SUMMARY   Patient Name: Caroline Snyder MRN: [id number] DOB:September 13, 1940, 81 y.o., female Today's Date: 12/08/2021   PT End of Session - 12/08/21 1014     Visit Number 1    Date for PT Re-Evaluation 01/05/22    PT Start Time 0928    PT Stop Time 1005    PT Time Calculation (min) 37 min    Activity Tolerance Patient tolerated treatment well    Behavior During Therapy [facility] for tasks assessed/performed             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   Bunion 07/01/2010   Depression 05/05/2007   External hemorrhoids without mention of complication 54/65/6812   GERD (gastroesophageal reflux disease) 2000   Hyperlipidemia 06/29/2006   Memory loss 04/15/2003   Migraine without aura, without mention of intractable migraine without mention of status migrainosus 03/24/1999   Osteoporosis, unspecified 04/15/2004   Tear film insufficiency, unspecified 03/24/1999   Unspecified essential hypertension 11/22/2007   Past Surgical History:  Procedure Laterality Date   COLONOSCOPY  06-16-2007   Internal hemorrhoids,laxity of anal sphincter,polyp at 25cm form anal verge, tortuous sigmoid colon. Dr.Orr    EYE SURGERY Bilateral 2010   cataract Dr. 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   Osteoporosis 01/10/2014   Vaginal atrophy 01/10/2014   Abnormal CT of the abdomen 12/19/2013   Nonspecific abnormal results of liver function study  12/19/2013   Loss of weight 12/19/2013   Abnormal CT of the chest 11/24/2013   Cervicalgia 08/21/2013   Depression, recurrent ([facility])    GERD (gastroesophageal reflux disease)    ALLERGIC RHINITIS DUE TO POLLEN 04/27/2010   PERSONAL HISTORY OF COLONIC POLYPS 04/27/2010   Essential hypertension 11/22/2007   Hyperlipidemia 06/29/2006    PCP: Sherrie Mustache, NP  REFERRING PROVIDER: Donella Stade, PA-C   REFERRING DIAG: [phone] (ICD-10-CM) - Facet arthritis of lumbar region M54.10 (ICD-10-CM) - Radicular syndrome of left leg   THERAPY DIAG:  Other low back pain  Muscle weakness (generalized)  ONSET DATE: 3-6 months ago  SUBJECTIVE:  SUBJECTIVE STATEMENT: Pt is a 81 y/o female who presents to OPPT for LBP for several months.  She reports she's been doing some stretches and squats and pain has mostly resolved.    PERTINENT HISTORY:  Depression, memory loss, migraines, osteoporosis, HTN  PAIN:  Are you having pain? Yes: NPRS scale: 0, none over past week/10 Pain location: back Pain description: unable to describe Aggravating factors: unknown Relieving factors: unknown   PRECAUTIONS: None  WEIGHT BEARING RESTRICTIONS No  FALLS:  Has patient fallen in last 6 months? 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   PATIENT SURVEYS:  12/08/21 FOTO 22 (predicted 24)  SCREENING FOR RED FLAGS: Bowel or bladder incontinence:  No Spinal tumors: No Cauda equina syndrome: No Compression fracture: No Abdominal aneurysm: No  COGNITION:  Overall cognitive status: History of cognitive impairments - at baseline, Difficulty to assess, No family/caregiver present to determine baseline cognitive functioning, and seems confused as to why she is here (keeps c/o "cough" - weak cough observed 1-2 times near end of session)        MUSCLE LENGTH: Hamstrings: mild tightness bil  POSTURE:  Increased thoracic kyphosis, rounded shoulders, forward head  PALPATION: No significant tenderness noted; does report pain around bil SIJ  LUMBAR ROM:   Active  A/PROM  12/08/2021  Flexion Limited 15%  Extension WNL  Right lateral flexion WNL  Left lateral flexion WNL  Right rotation WNL  Left rotation WNL   (Blank rows = not tested)   LE MMT:  MMT Right 12/08/2021 Left 12/08/2021  Hip flexion 3+/5 4/5  Hip extension 3/5 3/5  Hip abduction 3/5 3/5  Hip adduction    Knee flexion 4/5 4/5  Knee extension 3+/5 3+/5   (Blank rows = not tested)  LUMBAR SPECIAL TESTS:  Straight leg raise test: Negative  GAIT: Comments: decreased speed noted, but otherwise independent with mobility    TODAY'S TREATMENT  12/08/21 See HEP - performed trial reps with mod cues needed; did have episode of cramping in bil hamstring with bridging; pt then reported this is pain she experiences daily.  Recommended she follow up with PCP regarding daily episodes of cramping and encouraged regular hydration with electrolytes (water, Gatorade, etc.)   PATIENT EDUCATION:  Education details: HEP Person educated: Patient Education method: Consulting civil engineer, Demonstration, and Handouts Education comprehension: verbalized understanding, returned demonstration, and needs further education   HOME EXERCISE PROGRAM: Access Code: [email or web address] URL: [email or web address] Date: 12/08/2021 Prepared by: Faustino Congress  Exercises - Hooklying Single  Knee to Chest  - 2 x daily - 7 x weekly - 1 sets - 3 reps - 30 sec hold - Supine Piriformis Stretch with Foot on Ground  - 2 x daily - 7 x weekly - 1 sets - 3 reps - 30 sec hold - Supine Lower Trunk Rotation  - 2 x daily - 7 x weekly - 1 sets - 3 reps - 20-30 sec hold - Supine Bridge  - 2 x daily - 7 x weekly - 1 sets - 10 reps - 5 sec hold - Supine Hip Extension with Towel Roll (BKA)  - 2 x daily -  7 x weekly - 1 sets - 10 reps - 5 sec hold - Mini Squat with Counter Support  - 2 x daily - 7 x weekly - 1 sets - 10 reps  ASSESSMENT:  CLINICAL IMPRESSION: Patient is a 81 y.o. female who was seen today for physical therapy evaluation and treatment for LBP. Pt is a poor historian and difficult to fully assess her pain and symptoms.  She initially stated she was here for a "cough" and then once explained my role and the referral she reported her back pain has largely resolved.  It also seems her leg pain she has been c/o is possibly cramping rather than a true radiculopathy.  Recommend she follow up with PCP about this.  At this time HEP provided, and pt will call if symptoms return, as skilled PT not clinically indicated.  Will notify PCP and referring provider of observations today in clinic.     OBJECTIVE IMPAIRMENTS decreased strength, increased fascial restrictions, increased muscle spasms, impaired flexibility, postural dysfunction, and pain.   ACTIVITY LIMITATIONS  none .   PERSONAL FACTORS 3+ comorbidities: Depression, memory loss, migraines, osteoporosis, HTN  are also affecting patient's functional outcome.    REHAB POTENTIAL: Fair decreased cognition will impact potential  CLINICAL DECISION MAKING: Evolving/moderate complexity  EVALUATION COMPLEXITY: Moderate   GOALS: Goals reviewed with patient? Yes   1. To be determined if pt returns.  Will allow chart to remain open x 30 days.  PLAN: PT FREQUENCY:  will determine if pt returns, up to 1x/wk  PT DURATION: 4 weeks  PLANNED  INTERVENTIONS: Therapeutic exercises, Therapeutic activity, Neuromuscular re-education, Balance training, Gait training, Patient/Family education, Joint mobilization, Dry Needling, Electrical stimulation, Spinal mobilization, Cryotherapy, Moist heat, Taping, and Manual therapy.  PLAN FOR NEXT SESSION: if pt returns review HEP and reassess/progress HEP, if pt doesn't return will d/c PT     Laureen Abrahams, PT, DPT 12/08/21 10:32 AM      PHYSICAL THERAPY DISCHARGE SUMMARY  Visits from Start of Care: 1  Current functional level related to goals / functional outcomes: See above   Remaining deficits: See above   Education / Equipment: HEP

## 2022-01-01 ENCOUNTER — Ambulatory Visit (INDEPENDENT_AMBULATORY_CARE_PROVIDER_SITE_OTHER): Payer: Medicare Other | Admitting: Nurse Practitioner

## 2022-01-01 ENCOUNTER — Encounter: Payer: Self-pay | Admitting: Nurse Practitioner

## 2022-01-01 VITALS — BP 126/78 | HR 65 | Temp 97.9°F | Ht 61.0 in | Wt 107.9 lb

## 2022-01-01 DIAGNOSIS — M810 Age-related osteoporosis without current pathological fracture: Secondary | ICD-10-CM | POA: Diagnosis not present

## 2022-01-01 DIAGNOSIS — E782 Mixed hyperlipidemia: Secondary | ICD-10-CM

## 2022-01-01 DIAGNOSIS — F0150 Vascular dementia without behavioral disturbance: Secondary | ICD-10-CM | POA: Diagnosis not present

## 2022-01-01 DIAGNOSIS — F339 Major depressive disorder, recurrent, unspecified: Secondary | ICD-10-CM | POA: Diagnosis not present

## 2022-01-01 DIAGNOSIS — M5441 Lumbago with sciatica, right side: Secondary | ICD-10-CM | POA: Diagnosis not present

## 2022-01-01 DIAGNOSIS — G4700 Insomnia, unspecified: Secondary | ICD-10-CM | POA: Diagnosis not present

## 2022-01-01 DIAGNOSIS — G8929 Other chronic pain: Secondary | ICD-10-CM | POA: Diagnosis not present

## 2022-01-01 DIAGNOSIS — I10 Essential (primary) hypertension: Secondary | ICD-10-CM | POA: Diagnosis not present

## 2022-01-01 NOTE — Patient Instructions (Signed)
Shingles vaccine at local pharmacy
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
## 2022-01-08 ENCOUNTER — Ambulatory Visit (INDEPENDENT_AMBULATORY_CARE_PROVIDER_SITE_OTHER): Payer: Medicare Other | Admitting: Surgical

## 2022-01-08 ENCOUNTER — Encounter: Payer: Self-pay | Admitting: Surgical

## 2022-01-08 DIAGNOSIS — M47816 Spondylosis without myelopathy or radiculopathy, lumbar region: Secondary | ICD-10-CM | POA: Diagnosis not present

## 2022-01-08 DIAGNOSIS — M5410 Radiculopathy, site unspecified: Secondary | ICD-10-CM | POA: Diagnosis not present

## 2022-01-08 NOTE — Progress Notes (Signed)
Office Visit Note   Patient: Caroline Snyder           Date of Birth: 1941-02-24           MRN: [id number] Visit Date: 01/08/2022 Requested by: [name], NP [place],  [place] [address] PCP: [name], NP  Subjective: Chief Complaint  Patient presents with   Left Leg - Pain    HPI: Caroline Snyder is a 81 y.o. female who presents to the office for repeat evaluation of low back pain with left leg pain.  Patient states that the vast majority of her pain has resolved.  She states that she really only notices occasional tingling sensation in her lateral left calf at this point.  She does not notice this every day.  She has been able to return to dancing which is her main hobby that she enjoys.  No weakness of her legs or waking up at night..                ROS: All systems reviewed are negative as they relate to the chief complaint within the history of present illness.  Patient denies fevers or chills.  Assessment & Plan: Visit Diagnoses:  1. Facet arthritis of lumbar region   2. Radicular syndrome of left leg     Plan: Patient is a 81 year old female who presents for repeat evaluation of low back pain with left leg radicular pain.  She returns today following steroid Dosepak and therapy exercises with good relief.  Only occasional tingling sensation in her calf that does not even bother her every day.  She has returned to dancing.  Plan for her to follow-up with the office as needed.  Recommended she call the office if symptoms return and we can order MRI of the lumbar spine for further evaluation.  Follow-Up Instructions: Return if symptoms worsen or fail to improve.   Orders:  No orders of the defined types were placed in this encounter.  No orders of the defined types were placed in this encounter.     Procedures: No procedures performed   Clinical Data: No additional findings.  Objective: Vital Signs: There were no vitals taken for  this visit.  Physical Exam:  Constitutional: Patient appears well-developed HEENT:  Head: Normocephalic Eyes:EOM are normal Neck: Normal range of motion Cardiovascular: Normal rate Pulmonary/chest: Effort normal Neurologic: Patient is alert Skin: Skin is warm Psychiatric: Patient has normal mood and affect  Ortho Exam: Ortho exam demonstrates negative straight leg raise bilaterally.  5/5 motor strength of bilateral hip flexion, quadricep, hamstring, dorsiflexion, plantarflexion.  No significant antalgia with ambulation  Specialty Comments:  No specialty comments available.  Imaging: No results found.   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   Osteoporosis 01/10/2014   Vaginal atrophy 01/10/2014   Abnormal CT of the abdomen 12/19/2013   Nonspecific abnormal results of liver function study 12/19/2013   Loss of weight 12/19/2013   Abnormal CT of the chest 11/24/2013   Cervicalgia 08/21/2013   Depression, recurrent ([place] [facility])  GERD (gastroesophageal reflux disease)    ALLERGIC RHINITIS DUE TO POLLEN 04/27/2010   PERSONAL HISTORY OF COLONIC POLYPS 04/27/2010   Essential hypertension 11/22/2007   Hyperlipidemia 06/29/2006   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   Bunion 07/01/2010   Depression 05/05/2007   External hemorrhoids without mention of complication 85/46/2703   GERD (gastroesophageal reflux disease) 2000   Hyperlipidemia 06/29/2006   Memory loss 04/15/2003   Migraine without aura, without mention of  intractable migraine without mention of status migrainosus 03/24/1999   Osteoporosis, unspecified 04/15/2004   Tear film insufficiency, unspecified 03/24/1999   Unspecified essential hypertension 11/22/2007    Family History  Problem Relation Age of Onset   Alzheimer's disease Mother    Diabetes Mother    Transient ischemic attack Mother    Heart disease Mother        MI, CHF   Alcohol abuse Father    Kidney disease Father    Hypertension Sister    Hypertension Brother    Heart disease Brother    Hypertension Brother    Hypertension Sister    Hypertension Brother    Other Son        truck accident 07/2016   Colon cancer Neg Hx    Stomach cancer Neg Hx     Past Surgical History:  Procedure Laterality Date   COLONOSCOPY  06-16-2007   Internal hemorrhoids,laxity of anal sphincter,polyp at 25cm form anal verge, tortuous sigmoid colon. Dr.Orr    EYE SURGERY Bilateral 2010   cataract Dr. Bing Plume   VAGINAL HYSTERECTOMY  1980   Social History   Occupational History   Occupation: Retired  Tobacco Use   Smoking status: Never   Smokeless tobacco: Never  Vaping Use   Vaping Use: Never used  Substance and Sexual Activity   Alcohol use: No    Alcohol/week: 0.0 standard drinks of alcohol   Drug use: No   Sexual activity: Not Currently    Comment: 1st intercourse- 16, partners- 2,

## 2022-02-09 DIAGNOSIS — H353132 Nonexudative age-related macular degeneration, bilateral, intermediate dry stage: Secondary | ICD-10-CM | POA: Diagnosis not present

## 2022-03-01 ENCOUNTER — Ambulatory Visit: Payer: Medicare Other

## 2022-03-19 ENCOUNTER — Ambulatory Visit (INDEPENDENT_AMBULATORY_CARE_PROVIDER_SITE_OTHER): Payer: Medicare Other | Admitting: *Deleted

## 2022-03-19 DIAGNOSIS — M810 Age-related osteoporosis without current pathological fracture: Secondary | ICD-10-CM

## 2022-03-19 MED ORDER — DENOSUMAB 60 MG/ML ~~LOC~~ SOSY
60.0000 mg | PREFILLED_SYRINGE | Freq: Once | SUBCUTANEOUS | Status: AC
  Administered 2022-03-19: 60 mg via SUBCUTANEOUS

## 2022-04-09 ENCOUNTER — Ambulatory Visit (INDEPENDENT_AMBULATORY_CARE_PROVIDER_SITE_OTHER): Payer: Medicare Other | Admitting: Surgical

## 2022-04-09 DIAGNOSIS — R202 Paresthesia of skin: Secondary | ICD-10-CM | POA: Diagnosis not present

## 2022-04-09 DIAGNOSIS — R200 Anesthesia of skin: Secondary | ICD-10-CM

## 2022-04-09 MED ORDER — GABAPENTIN 300 MG PO CAPS: 300.0000 mg | ORAL_CAPSULE | Freq: Every day | ORAL | 0 refills | Status: DC | PRN

## 2022-04-10 ENCOUNTER — Encounter: Payer: Self-pay | Admitting: Surgical

## 2022-04-10 NOTE — Progress Notes (Signed)
Office Visit Note   Patient: Caroline Snyder           Date of Birth: 12/17/1940           MRN: [id number] Visit Date: 04/09/2022 Requested by: [name], NP [place],  [place] [address] PCP: [name], NP  Subjective: Chief Complaint  Patient presents with   Left Leg - Pain    HPI: Caroline Snyder is a 81 y.o. female who presents to the office complaining of left leg tingling.  Patient notes a tingling sensation in the lateral aspect of the distal left leg just proximal to the ankle.  She notes that does not really get a lot of pain just a tingling sensation that is intermittent.  Does not really associate with any specific activities.  Does not really get in the way of her living her life or dancing.  Not really having any pain at all today.  No low back pain or radicular pain.  Occasionally will travel into the lateral aspect of the foot..                ROS: All systems reviewed are negative as they relate to the chief complaint within the history of present illness.  Patient denies fevers or chills.  Assessment & Plan: Visit Diagnoses:  1. Numbness and tingling of left leg     Plan: Patient is a 81 year old female who presents for evaluation of intermittent left leg tingling.  She has most of her symptoms in the lateral aspect of the distal third calf.  Occasionally travels into the lateral aspect of the foot.  A persistent problem and no real aggravating activities.  She has previously taken gabapentin with some relief.  Would like to try higher dose of gabapentin.  Prescribed gabapentin 300 mg to take once daily as needed.  Cautioned her against driving while taking this medication.  She will follow-up in 2 months for clinical recheck with Dr. Marlou Sa with consideration of nerve conduction study if she is having persistent tingling at that time continually.  Not really having any back pain or radicular pain suggestive of lumbar radiculopathy and no  weakness.  Follow-Up Instructions: No follow-ups on file.   Orders:  No orders of the defined types were placed in this encounter.  Meds ordered this encounter  Medications   gabapentin (NEURONTIN) 300 MG capsule    Sig: Take 1 capsule (300 mg total) by mouth daily as needed. Do not operate a motor vehicle after taking this medication.    Dispense:  30 capsule    Refill:  0      Procedures: No procedures performed   Clinical Data: No additional findings.  Objective: Vital Signs: There were no vitals taken for this visit.  Physical Exam:  Constitutional: Patient appears well-developed HEENT:  Head: Normocephalic Eyes:EOM are normal Neck: Normal range of motion Cardiovascular: Normal rate Pulmonary/chest: Effort normal Neurologic: Patient is alert Skin: Skin is warm Psychiatric: Patient has normal mood and affect  Ortho Exam: Ortho exam demonstrates intact ankle dorsiflexion and plantarflexion.  Excellent strength of eversion/inversion.  Able to wiggle all her toes.  2+ DP pulse noted.  No calf tenderness.  Negative Homans' sign.  No ecchymosis or swelling noted.  No tenderness over the lateral or medial malleoli, Achilles tendon, Achilles tendon insertion.  She does have positive Tinel sign over the posterior lateral aspect of the left distal third calf.  Specialty Comments:  No  specialty comments available.  Imaging: No results found.   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   Osteoporosis 01/10/2014   Vaginal atrophy 01/10/2014   Abnormal CT of  the abdomen 12/19/2013   Nonspecific abnormal results of liver function study 12/19/2013   Loss of weight 12/19/2013   Abnormal CT of the chest 11/24/2013   Cervicalgia 08/21/2013   Depression, recurrent ([facility])    GERD (gastroesophageal reflux disease)    ALLERGIC RHINITIS DUE TO POLLEN 04/27/2010   PERSONAL HISTORY OF COLONIC POLYPS 04/27/2010   Essential hypertension 11/22/2007   Hyperlipidemia 06/29/2006   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   Bunion 07/01/2010   Depression 05/05/2007   External hemorrhoids without mention of complication 02/54/2706   GERD (gastroesophageal reflux disease) 2000   Hyperlipidemia 06/29/2006   Memory loss 04/15/2003   Migraine without aura, without mention of intractable migraine without mention of status migrainosus 03/24/1999   Osteoporosis, unspecified 04/15/2004   Tear film insufficiency, unspecified 03/24/1999   Unspecified essential hypertension 11/22/2007    Family History  Problem Relation Age of Onset   Alzheimer's disease Mother    Diabetes Mother    Transient ischemic attack Mother    Heart disease Mother        MI, CHF   Alcohol abuse Father    Kidney disease Father    Hypertension Sister    Hypertension Brother    Heart disease Brother    Hypertension Brother    Hypertension Sister    Hypertension Brother    Other Son        truck accident 07/2016   Colon cancer Neg Hx    Stomach cancer Neg Hx     Past Surgical History:  Procedure Laterality Date   COLONOSCOPY  06-16-2007   Internal hemorrhoids,laxity of anal sphincter,polyp at 25cm form anal verge, tortuous sigmoid colon. Dr.Orr    EYE SURGERY Bilateral 2010   cataract Dr. Bing Plume   VAGINAL HYSTERECTOMY  1980   Social History   Occupational History   Occupation: Retired  Tobacco Use   Smoking status: Never   Smokeless tobacco: Never  Vaping Use   Vaping Use: Never used  Substance and Sexual Activity    Alcohol use: No    Alcohol/week: 0.0 standard drinks of alcohol   Drug use: No   Sexual activity: Not Currently    Comment: 1st intercourse- 16, partners- 2,

## 2022-05-11 DIAGNOSIS — B079 Viral wart, unspecified: Secondary | ICD-10-CM | POA: Diagnosis not present

## 2022-05-11 DIAGNOSIS — L814 Other melanin hyperpigmentation: Secondary | ICD-10-CM | POA: Diagnosis not present

## 2022-05-11 DIAGNOSIS — D225 Melanocytic nevi of trunk: Secondary | ICD-10-CM | POA: Diagnosis not present

## 2022-05-11 DIAGNOSIS — D492 Neoplasm of unspecified behavior of bone, soft tissue, and skin: Secondary | ICD-10-CM | POA: Diagnosis not present

## 2022-05-11 DIAGNOSIS — L821 Other seborrheic keratosis: Secondary | ICD-10-CM | POA: Diagnosis not present

## 2022-05-13 ENCOUNTER — Other Ambulatory Visit: Payer: Self-pay | Admitting: Nurse Practitioner

## 2022-05-13 DIAGNOSIS — I10 Essential (primary) hypertension: Secondary | ICD-10-CM

## 2022-06-09 ENCOUNTER — Encounter: Payer: Self-pay | Admitting: Orthopedic Surgery

## 2022-06-09 ENCOUNTER — Ambulatory Visit (INDEPENDENT_AMBULATORY_CARE_PROVIDER_SITE_OTHER): Payer: Medicare Other | Admitting: Orthopedic Surgery

## 2022-06-09 DIAGNOSIS — R202 Paresthesia of skin: Secondary | ICD-10-CM

## 2022-06-09 DIAGNOSIS — R200 Anesthesia of skin: Secondary | ICD-10-CM | POA: Diagnosis not present

## 2022-06-09 NOTE — Progress Notes (Signed)
Office Visit Note   Patient: Caroline Snyder           Date of Birth: May 20, 1941           MRN: [id number] Visit Date: 06/09/2022 Requested by: [name], NP [place],  [place] [address] PCP: [name], NP  Subjective: Chief Complaint  Patient presents with   Left Leg - Follow-up    HPI: Caroline Snyder is a 81 y.o. female who presents to the office reporting sporadic focal left leg numbness.  Decision was for or against EMG nerve study today.  States that the discomfort comes and goes.  Tingling is focal and does not go down to the foot and does not go up above the knee.  No weakness just pain.  No low back pain.  Just has episodic sensations around the left leg..                ROS: All systems reviewed are negative as they relate to the chief complaint within the history of present illness.  Patient denies fevers or chills.  Assessment & Plan: Visit Diagnoses: No diagnosis found.  Plan: Impression is episodic paresthesias left lower extremity which do not really rise to the threshold of requiring further work-up.  Structurally she is got no tenderness around the peroneal nerve and no atrophy in the leg and excellent eversion strength dorsiflexion strength.  Plan is observation for now with follow-up as needed  Follow-Up Instructions: No follow-ups on file.   Orders:  No orders of the defined types were placed in this encounter.  No orders of the defined types were placed in this encounter.     Procedures: No procedures performed   Clinical Data: No additional findings.  Objective: Vital Signs: There were no vitals taken for this visit.  Physical Exam:  Constitutional: Patient appears well-developed HEENT:  Head: Normocephalic Eyes:EOM are normal Neck: Normal range of motion Cardiovascular: Normal rate Pulmonary/chest: Effort normal Neurologic: Patient is alert Skin: Skin is warm Psychiatric: Patient has normal mood and  affect  Ortho Exam: Ortho exam demonstrates full active and passive range of motion of the ankle and knee on the left.  Negative Tinel's around the superficial peroneal nerve just above the malleolus laterally.  No calf tenderness.  Excellent eversion strength ankle dorsiflexion strength.  Specialty Comments:  No specialty comments available.  Imaging: No results found.   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   Osteoporosis 01/10/2014   Vaginal atrophy 01/10/2014   Abnormal CT of the abdomen 12/19/2013   Nonspecific abnormal results of liver function study 12/19/2013   Loss of weight 12/19/2013   Abnormal CT of the chest 11/24/2013   Cervicalgia 08/21/2013   Depression, recurrent ([facility])    GERD (gastroesophageal reflux disease)    ALLERGIC RHINITIS DUE TO POLLEN 04/27/2010   PERSONAL HISTORY OF COLONIC POLYPS 04/27/2010   Essential hypertension 11/22/2007   Hyperlipidemia 06/29/2006   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  Bunion 07/01/2010   Depression 05/05/2007   External hemorrhoids without mention of complication 58/04/9832   GERD (gastroesophageal reflux disease) 2000   Hyperlipidemia 06/29/2006   Memory loss 04/15/2003   Migraine without aura, without mention of intractable migraine without mention of status migrainosus 03/24/1999   Osteoporosis, unspecified 04/15/2004   Tear film insufficiency, unspecified 03/24/1999   Unspecified essential hypertension 11/22/2007    Family History  Problem  Relation Age of Onset   Alzheimer's disease Mother    Diabetes Mother    Transient ischemic attack Mother    Heart disease Mother        MI, CHF   Alcohol abuse Father    Kidney disease Father    Hypertension Sister    Hypertension Brother    Heart disease Brother    Hypertension Brother    Hypertension Sister    Hypertension Brother    Other Son        truck accident 07/2016   Colon cancer Neg Hx    Stomach cancer Neg Hx     Past Surgical History:  Procedure Laterality Date   COLONOSCOPY  06-16-2007   Internal hemorrhoids,laxity of anal sphincter,polyp at 25cm form anal verge, tortuous sigmoid colon. Dr.Orr    EYE SURGERY Bilateral 2010   cataract Dr. Bing Plume   VAGINAL HYSTERECTOMY  1980   Social History   Occupational History   Occupation: Retired  Tobacco Use   Smoking status: Never   Smokeless tobacco: Never  Vaping Use   Vaping Use: Never used  Substance and Sexual Activity   Alcohol use: No    Alcohol/week: 0.0 standard drinks of alcohol   Drug use: No   Sexual activity: Not Currently    Comment: 1st intercourse- 16, partners- 2,

## 2022-06-29 ENCOUNTER — Encounter: Payer: Medicare Other | Admitting: Nurse Practitioner

## 2022-06-29 ENCOUNTER — Encounter: Payer: Self-pay | Admitting: Nurse Practitioner

## 2022-06-29 ENCOUNTER — Telehealth: Payer: Self-pay

## 2022-06-29 ENCOUNTER — Ambulatory Visit (INDEPENDENT_AMBULATORY_CARE_PROVIDER_SITE_OTHER): Payer: Medicare Other | Admitting: Nurse Practitioner

## 2022-06-29 ENCOUNTER — Other Ambulatory Visit: Payer: Self-pay | Admitting: Surgical

## 2022-06-29 DIAGNOSIS — Z23 Encounter for immunization: Secondary | ICD-10-CM | POA: Diagnosis not present

## 2022-06-29 DIAGNOSIS — Z0000 Encounter for general adult medical examination without abnormal findings: Secondary | ICD-10-CM

## 2022-06-29 DIAGNOSIS — I10 Essential (primary) hypertension: Secondary | ICD-10-CM | POA: Diagnosis not present

## 2022-06-29 DIAGNOSIS — G4700 Insomnia, unspecified: Secondary | ICD-10-CM | POA: Diagnosis not present

## 2022-06-29 MED ORDER — LOSARTAN POTASSIUM 100 MG PO TABS: 100.0000 mg | ORAL_TABLET | Freq: Every day | ORAL | 3 refills | Status: DC

## 2022-06-29 MED ORDER — TRAZODONE HCL 50 MG PO TABS: ORAL_TABLET | ORAL | 1 refills | Status: DC

## 2022-06-29 NOTE — Progress Notes (Signed)
Subjective:   Caroline Snyder is a 81 y.o. female who presents for Medicare Annual (Subsequent) preventive examination.  Review of Systems           Objective:    There were no vitals filed for this visit. There is no height or weight on file to calculate BMI.     06/29/2022   10:45 AM 01/01/2022   11:10 AM 12/08/2021    9:34 AM 11/17/2021    2:46 PM 08/31/2021   10:33 AM 06/16/2021    3:44 PM 02/04/2021   11:34 AM  Advanced Directives  Does Patient Have a Medical Advance Directive? No No No No No No No  Would patient like information on creating a medical advance directive? Yes (MAU/Ambulatory/Procedural Areas - Information given) No - Patient declined No - Patient declined No - Patient declined Yes (MAU/Ambulatory/Procedural Areas - Information given)  No - Patient declined    Current Medications (verified) Outpatient Encounter Medications as of 06/29/2022  Medication Sig   Ascorbic Acid (VITAMIN C) 1000 MG tablet Take 1,000 mg by mouth daily.   aspirin EC 81 MG tablet Take 1 tablet (81 mg total) by mouth daily. Swallow whole.   Calcium-Vitamins C & D (CALCIUM/C/D PO) Take by mouth.   Cholecalciferol 50 MCG (2000 UT) CAPS Take 1 tablet daily   gabapentin (NEURONTIN) 300 MG capsule Take 1 capsule (300 mg total) by mouth daily as needed. Do not operate a motor vehicle after taking this medication.   hydrochlorothiazide (HYDRODIURIL) 25 MG tablet TAKE 1/2 TABLET BY MOUHT ONCE DAILY. Need appointment before anymore future refills.   Multiple Vitamins-Minerals (CENTRUM ADULTS PO) Take 1 capsule by mouth daily.   Multiple Vitamins-Minerals (ICAPS AREDS FORMULA PO) Take by mouth 2 (two) times daily.   rosuvastatin (CRESTOR) 20 MG tablet Take one tablet by mouth once daily. Need appointment before anymore future refills.   losartan (COZAAR) 100 MG tablet TAKE 1 TABLET (100 MG TOTAL) BY MOUTH DAILY. DX I10 (Patient not taking: Reported on 06/29/2022)   traZODone (DESYREL) 50 MG tablet  TAKE 1 TABLET BY MOUTH EVERYDAY AT BEDTIME (Patient not taking: Reported on 06/29/2022)   [DISCONTINUED] Multiple Vitamin (MULTI VITAMIN DAILY PO) Take by mouth. Centrum Silver for Women Take 1 tablet daily (Patient not taking: Reported on 06/29/2022)   No facility-administered encounter medications on file as of 06/29/2022.    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 Dr.Orr    EYE SURGERY Bilateral 2010   cataract Dr. Bing Plume   VAGINAL HYSTERECTOMY  1980   Family History  Problem Relation Age of Onset   Alzheimer's disease Mother    Diabetes Mother    Transient ischemic attack Mother    Heart disease Mother        MI, CHF   Alcohol abuse Father    Kidney  disease Father    Hypertension Sister    Hypertension Brother    Heart disease Brother    Hypertension Brother    Hypertension Sister    Hypertension Brother    Other Son        truck accident 07/2016   Colon cancer Neg Hx    Stomach cancer Neg Hx    Social History   Socioeconomic History   Marital status: Widowed    Spouse name: Not on file   Number of children: 2   Years of education: Not on file   Highest education level: Not on file  Occupational History   Occupation: Retired   Tobacco Use   Smoking status: Never   Smokeless tobacco: Never  Vaping Use   Vaping Use: Never used  Substance and Sexual Activity   Alcohol use: No    Alcohol/week: 0.0 standard drinks of alcohol   Drug use: No   Sexual activity: Not Currently    Comment: 1st intercourse- 16, partners- 2,   Other Topics Concern   Not on file  Social History Narrative   Not on file   Social Determinants of Health   Financial Resource Strain: [place]  (06/09/2018)   Overall Financial Resource Strain (CARDIA)    Difficulty of Paying Living Expenses: Not hard at all  Food Insecurity: No Food Insecurity (06/09/2018)   Hunger Vital Sign    Worried About Running Out of Food in the Last Year: Never true    [place] in the Last Year: Never true  Transportation Needs: No Transportation Needs (06/09/2018)   PRAPARE - Hydrologist (Medical): No    Lack of Transportation (Non-Medical): No  Physical Activity: Insufficiently Active (06/09/2018)   Exercise Vital Sign    Days of Exercise per Week: 1 day    Minutes of Exercise per Session: 60 min  Stress: No Stress Concern Present (06/09/2018)   [place]    Feeling of Stress : Not at all  Social Connections: Somewhat Isolated (06/09/2018)   Social Connection and Isolation Panel [NHANES]    Frequency of Communication with Friends and Family: More than three times a week    Frequency of Social Gatherings with Friends and Family: More than three times a week    Attends Religious Services: More than 4 times per year    Active Member of [employer] or Organizations: No    Attends Archivist Meetings: Never    Marital Status: Widowed    Tobacco Counseling Counseling given: Not Answered   Clinical Intake:                 Diabetic?no         Activities of Daily Living     No data to display          Patient Care Team: [name], NP as PCP - General (Geriatric Medicine) [name], MD as Consulting Physician (Ophthalmology) Calvert Cantor, MD as Consulting Physician (Ophthalmology) Terrance Mass, MD (Inactive) as Consulting Physician (Gynecology)  Indicate any recent Medical Services you may have received from other than [facility] providers in the past year (date may be approximate).     Assessment:   This is a routine wellness examination for [place].  Hearing/Vision screen Hearing Screening - Comments:: No hearing issues  Vision Screening - Comments:: Last eye exam less than 12 months ago. Dr.[facility]   Dietary issues  and exercise activities discussed:     Goals Addressed   None    Depression Screen    06/29/2022   10:43 AM 01/01/2022   11:10 AM 08/31/2021   10:32 AM 06/16/2021    3:41 PM 09/17/2020   10:28 AM 06/13/2020    8:59 AM 04/25/2020   10:11 AM  PHQ 2/9 Scores  PHQ - 2 Score 0 0 0 0 0 0 0    Fall Risk    06/29/2022   10:43 AM 01/01/2022   11:10 AM 11/17/2021    2:46 PM 08/31/2021   10:32 AM 06/16/2021    3:43 PM  Fall Risk   Falls in the past year? 0 0 0 0 0  Number falls in past yr: 0 0 0 0 0  Injury with Fall? 0 0 0 0 0  Risk for fall due to : No Fall Risks No Fall Risks No Fall Risks No Fall Risks No Fall Risks  Follow up Falls evaluation completed Falls evaluation completed Falls evaluation completed Falls evaluation completed Falls evaluation completed    FALL RISK PREVENTION PERTAINING TO THE HOME:  Any stairs in or around the home? No  If so, are there any without handrails? No  Home free of loose throw rugs in walkways, pet beds, electrical cords, etc? Yes  Adequate lighting in your home to reduce risk of falls? Yes   ASSISTIVE DEVICES UTILIZED TO PREVENT FALLS:  Life alert? No  Use of a cane, walker or w/c? No  Grab bars in the bathroom? Yes  Shower chair or bench in shower? No  Elevated toilet seat or a handicapped toilet? No   TIMED UP AND GO:  Was  the test performed? No .    Cognitive Function:    05/21/2020    9:49 AM 06/09/2018   10:56 AM 06/02/2017    1:23 PM 05/28/2016    4:23 PM  MMSE - Mini Mental State Exam  Orientation to time '5 5 4 5  '$ Orientation to Place '3 5 5 5  '$ Orientation to Place-comments Did not know the name of office or NP that she was seeing today     Registration '3 3 3 3  '$ Attention/ Calculation '5 5 5 5  '$ Recall 0 0 0 2  Language- name 2 objects '2 2 2 2  '$ Language- repeat '1 1 1 1  '$ Language- follow 3 step command '3 3 3 3  '$ Language- read & follow direction '1 1 1 1  '$ Write a sentence '1 1 1 1  '$ Copy design '1 1 1 1  '$ Total score '25 27 26 29        '$ 06/29/2022   10:45 AM 06/16/2021    3:44 PM 06/13/2020    9:05 AM 06/13/2019    8:18 AM  6CIT Screen  What Year? 0 points 0 points 0 points 0 points  What month? 0 points 0 points 0 points 0 points  What time? 0 points 0 points 0 points 0 points  Count back from 20 0 points 0 points 0 points 0 points  Months in reverse 4 points 4 points 2 points 2 points  Repeat phrase 4 points 10 points 0 points 4 points  Total Score 8 points 14 points 2 points 6 points    Immunizations Immunization History  Administered Date(s) Administered   Fluad Quad(high Dose 65+) 04/26/2019, 04/21/2020   Influenza, High Dose Seasonal PF 03/11/2017, 06/09/2018   Influenza,inj,Quad PF,6+ Mos 07/19/2013, 05/28/2016, 06/02/2017  Influenza,inj,quad, With Preservative 06/08/2021   Influenza-Unspecified 05/23/2015   PFIZER(Purple Top)SARS-COV-2 Vaccination 08/22/2019, 09/12/2019, 05/29/2020   PPD Test 11/30/2013, 05/30/2015   Pfizer Covid-19 Vaccine Bivalent Booster 9yr & up 06/08/2021   Pneumococcal Conjugate-13 12/24/2014, 05/28/2016   Pneumococcal Polysaccharide-23 06/09/2018   Tdap 03/11/2017, 11/17/2018   Zoster, Live 11/19/2009    TDAP status: Up to date  Flu Vaccine status: Up to date  Pneumococcal vaccine status: Up to date  Covid-19 vaccine status: Information  provided on how to obtain vaccines.   Qualifies for Shingles Vaccine? Yes   Zostavax completed No   Shingrix Completed?: No.    Education has been provided regarding the importance of this vaccine. Patient has been advised to call insurance company to determine out of pocket expense if they have not yet received this vaccine. Advised may also receive vaccine at local pharmacy or Health Dept. Verbalized acceptance and understanding.  Screening Tests Health Maintenance  Topic Date Due   Zoster Vaccines- Shingrix (1 of 2) Never done   INFLUENZA VACCINE  03/02/2022   COVID-19 Vaccine (5 - 2023-24 season) 04/02/2022   COLONOSCOPY (Pts 45-448yrInsurance coverage will need to be confirmed)  05/13/2022   Medicare Annual Wellness (AWV)  06/16/2022   MAMMOGRAM  07/06/2022   Pneumonia Vaccine 6535Years old  Completed   DEXA SCAN  Completed   HPV VACCINES  Aged Out    Health Maintenance  Health Maintenance Due  Topic Date Due   Zoster Vaccines- Shingrix (1 of 2) Never done   INFLUENZA VACCINE  03/02/2022   COVID-19 Vaccine (5 - 2023-24 season) 04/02/2022   COLONOSCOPY (Pts [id number] coverage will need to be confirmed)  05/13/2022   Medicare Annual Wellness (AWV)  06/16/2022    Colorectal cancer screening: No longer required.   Mammogram status: No longer required due to age.  Bone Density status: Completed 03/25/21. Results reflect: Bone density results: OSTEOPOROSIS. Repeat every 3 years.  Lung Cancer Screening: (Low Dose CT Chest recommended if Age 33-7-80ars, 30 pack-year currently smoking OR have quit w/in 15years.) does not qualify.   Lung Cancer Screening Referral: na  Additional Screening:  Hepatitis C Screening: does not qualify;   Vision Screening: Recommended annual ophthalmology exams for early detection of glaucoma and other disorders of the eye. Is the patient up to date with their annual eye exam?  Yes  Who is the provider or what is the name of the office  in which the patient attends annual eye exams? [facility]  If pt is not established with a provider, would they like to be referred to a provider to establish care? No .   Dental Screening: Recommended annual dental exams for proper oral hygiene  Community Resource Referral / Chronic Care Management: CRR required this visit?  No   CCM required this visit?  No      Plan:     I have personally reviewed and noted the following in the patient's chart:   Medical and social history Use of alcohol, tobacco or illicit drugs  Current medications and supplements including opioid prescriptions. Patient is not currently taking opioid prescriptions. Functional ability and status Nutritional status Physical activity Advanced directives List of other physicians Hospitalizations, surgeries, and ER visits in previous 12 months Vitals Screenings to include cognitive, depression, and falls Referrals and appointments  In addition, I have reviewed and discussed with patient certain preventive protocols, quality metrics, and best practice recommendations. A written personalized care plan for preventive services as well  as general preventive health recommendations were provided to patient.     [name], NP   06/29/2022    Virtual Visit via Telephone Note  I connected with patient 06/29/22 at 10:40 AM EST by telephone and verified that I am speaking with the correct person using two identifiers.  Location: Patient: home Provider: [facility]   I discussed the limitations, risks, security and privacy concerns of performing an evaluation and management service by telephone and the availability of in person appointments. I also discussed with the patient that there may be a patient responsible charge related to this service. The patient expressed understanding and agreed to proceed.   I discussed the assessment and treatment plan with the patient. The patient was provided an opportunity to  ask questions and all were answered. The patient agreed with the plan and demonstrated an understanding of the instructions.   The patient was advised to call back or seek an in-person evaluation if the symptoms worsen or if the condition fails to improve as anticipated.  I provided 14 minutes of non-face-to-face time during this encounter.  Carlos American. Harle Battiest Avs printed and mailed

## 2022-06-29 NOTE — Telephone Encounter (Signed)
[name], Caroline Snyder are scheduled for a virtual visit with your provider today.    Just as we do with appointments in the office, we must obtain your consent to participate.  Your consent will be active for this visit and any virtual visit you may have with one of our providers in the next 365 days.    If you have a MyChart account, I can also send a copy of this consent to you electronically.  All virtual visits are billed to your insurance company just like a traditional visit in the office.  As this is a virtual visit, video technology does not allow for your provider to perform a traditional examination.  This may limit your provider's ability to fully assess your condition.  If your provider identifies any concerns that need to be evaluated in person or the need to arrange testing such as labs, EKG, etc, we will make arrangements to do so.    Although advances in technology are sophisticated, we cannot ensure that it will always work on either your end or our end.  If the connection with a video visit is poor, we may have to switch to a telephone visit.  With either a video or telephone visit, we are not always able to ensure that we have a secure connection.   I need to obtain your verbal consent now.   Are you willing to proceed with your visit today?   Caroline Snyder has provided verbal consent on 06/29/2022 for a virtual visit (video or telephone).   Leigh Aurora Hoffman, [place] 06/29/2022  11:03 AM

## 2022-06-29 NOTE — Patient Instructions (Signed)
[name] , Thank you for taking time to come for your Medicare Wellness Visit. I appreciate your ongoing commitment to your health goals. Please review the following plan we discussed and let me know if I can assist you in the future.   Screening recommendations/referrals: Colonoscopy aged out Mammogram aged out Bone Density up to date Recommended yearly ophthalmology/optometry visit for glaucoma screening and checkup Recommended yearly dental visit for hygiene and checkup  Vaccinations: Influenza vaccine- due annually in September/October Pneumococcal vaccine up to date Tdap vaccine up to date Shingles vaccine recommended to complete    Advanced directives: recommended to complete  Conditions/risks identified: progressive memory loss  Next appointment: yearly- next year in person   Preventive Care 39 Years and Older, Female Preventive care refers to lifestyle choices and visits with your health care provider that can promote health and wellness. What does preventive care include? A yearly physical exam. This is also called an annual well check. Dental exams once or twice a year. Routine eye exams. Ask your health care provider how often you should have your eyes checked. Personal lifestyle choices, including: Daily care of your teeth and gums. Regular physical activity. Eating a healthy diet. Avoiding tobacco and drug use. Limiting alcohol use. Practicing safe sex. Taking low-dose aspirin every day. Taking vitamin and mineral supplements as recommended by your health care provider. What happens during an annual well check? The services and screenings done by your health care provider during your annual well check will depend on your age, overall health, lifestyle risk factors, and family history of disease. Counseling  Your health care provider may ask you questions about your: Alcohol use. Tobacco use. Drug use. Emotional well-being. Home and relationship  well-being. Sexual activity. Eating habits. History of falls. Memory and ability to understand (cognition). Work and work Statistician. Reproductive health. Screening  You may have the following tests or measurements: Height, weight, and BMI. Blood pressure. Lipid and cholesterol levels. These may be checked every 5 years, or more frequently if you are over 58 years old. Skin check. Lung cancer screening. You may have this screening every year starting at age 28 if you have a 30-pack-year history of smoking and currently smoke or have quit within the past 15 years. Fecal occult blood test (FOBT) of the stool. You may have this test every year starting at age 56. Flexible sigmoidoscopy or colonoscopy. You may have a sigmoidoscopy every 5 years or a colonoscopy every 10 years starting at age 67. Hepatitis C blood test. Hepatitis B blood test. Sexually transmitted disease (STD) testing. Diabetes screening. This is done by checking your blood sugar (glucose) after you have not eaten for a while (fasting). You may have this done every 1-3 years. Bone density scan. This is done to screen for osteoporosis. You may have this done starting at age 8. Mammogram. This may be done every 1-2 years. Talk to your health care provider about how often you should have regular mammograms. Talk with your health care provider about your test results, treatment options, and if necessary, the need for more tests. Vaccines  Your health care provider may recommend certain vaccines, such as: Influenza vaccine. This is recommended every year. Tetanus, diphtheria, and acellular pertussis (Tdap, Td) vaccine. You may need a Td booster every 10 years. Zoster vaccine. You may need this after age 44. Pneumococcal 13-valent conjugate (PCV13) vaccine. One dose is recommended after age 59. Pneumococcal polysaccharide (PPSV23) vaccine. One dose is recommended after age 3. Talk to  your health care provider about which  screenings and vaccines you need and how often you need them. This information is not intended to replace advice given to you by your health care provider. Make sure you discuss any questions you have with your health care provider. Document Released: 08/15/2015 Document Revised: 04/07/2016 Document Reviewed: 05/20/2015 Elsevier Interactive Patient Education  2017 [place] Prevention in the Home Falls can cause injuries. They can happen to people of all ages. There are many things you can do to make your home safe and to help prevent falls. What can I do on the outside of my home? Regularly fix the edges of walkways and driveways and fix any cracks. Remove anything that might make you trip as you walk through a door, such as a raised step or threshold. Trim any bushes or trees on the path to your home. Use bright outdoor lighting. Clear any walking paths of anything that might make someone trip, such as rocks or tools. Regularly check to see if handrails are loose or broken. Make sure that both sides of any steps have handrails. Any raised decks and porches should have guardrails on the edges. Have any leaves, snow, or ice cleared regularly. Use sand or salt on walking paths during winter. Clean up any spills in your garage right away. This includes oil or grease spills. What can I do in the bathroom? Use night lights. Install grab bars by the toilet and in the tub and shower. Do not use towel bars as grab bars. Use non-skid mats or decals in the tub or shower. If you need to sit down in the shower, use a plastic, non-slip stool. Keep the floor dry. Clean up any water that spills on the floor as soon as it happens. Remove soap buildup in the tub or shower regularly. Attach bath mats securely with double-sided non-slip rug tape. Do not have throw rugs and other things on the floor that can make you trip. What can I do in the bedroom? Use night lights. Make sure that you have a  light by your bed that is easy to reach. Do not use any sheets or blankets that are too big for your bed. They should not hang down onto the floor. Have a firm chair that has side arms. You can use this for support while you get dressed. Do not have throw rugs and other things on the floor that can make you trip. What can I do in the kitchen? Clean up any spills right away. Avoid walking on wet floors. Keep items that you use a lot in easy-to-reach places. If you need to reach something above you, use a strong step stool that has a grab bar. Keep electrical cords out of the way. Do not use floor polish or wax that makes floors slippery. If you must use wax, use non-skid floor wax. Do not have throw rugs and other things on the floor that can make you trip. What can I do with my stairs? Do not leave any items on the stairs. Make sure that there are handrails on both sides of the stairs and use them. Fix handrails that are broken or loose. Make sure that handrails are as long as the stairways. Check any carpeting to make sure that it is firmly attached to the stairs. Fix any carpet that is loose or worn. Avoid having throw rugs at the top or bottom of the stairs. If you do have throw rugs, attach  them to the floor with carpet tape. Make sure that you have a light switch at the top of the stairs and the bottom of the stairs. If you do not have them, ask someone to add them for you. What else can I do to help prevent falls? Wear shoes that: Do not have high heels. Have rubber bottoms. Are comfortable and fit you well. Are closed at the toe. Do not wear sandals. If you use a stepladder: Make sure that it is fully opened. Do not climb a closed stepladder. Make sure that both sides of the stepladder are locked into place. Ask someone to hold it for you, if possible. Clearly mark and make sure that you can see: Any grab bars or handrails. First and last steps. Where the edge of each step  is. Use tools that help you move around (mobility aids) if they are needed. These include: Canes. Walkers. Scooters. Crutches. Turn on the lights when you go into a dark area. Replace any light bulbs as soon as they burn out. Set up your furniture so you have a clear path. Avoid moving your furniture around. If any of your floors are uneven, fix them. If there are any pets around you, be aware of where they are. Review your medicines with your doctor. Some medicines can make you feel dizzy. This can increase your chance of falling. Ask your doctor what other things that you can do to help prevent falls. This information is not intended to replace advice given to you by your health care provider. Make sure you discuss any questions you have with your health care provider. Document Released: 05/15/2009 Document Revised: 12/25/2015 Document Reviewed: 08/23/2014 Elsevier Interactive Patient Education  2017 [employer].

## 2022-06-29 NOTE — Progress Notes (Signed)
   This service is provided via telemedicine  No vital signs collected/recorded due to the encounter was a telemedicine visit.   Location of patient (ex: home, work):  Home  Patient consents to a telephone visit: Yes, see telephone visit dated 06/29/22  Location of the provider (ex: office, home):  [facility], Office   Name of any referring provider:  N/A  Names of all persons participating in the telemedicine service and their role in the encounter:  S.Chrae B/CMA, Sherrie Mustache, NP, and Patient   Time spent on call:  17 min with medical assistant

## 2022-07-02 ENCOUNTER — Ambulatory Visit: Payer: Medicare Other | Admitting: Nurse Practitioner

## 2022-07-12 ENCOUNTER — Ambulatory Visit (INDEPENDENT_AMBULATORY_CARE_PROVIDER_SITE_OTHER): Payer: Medicare Other | Admitting: Adult Health

## 2022-07-12 ENCOUNTER — Encounter: Payer: Self-pay | Admitting: Adult Health

## 2022-07-12 VITALS — BP 170/90 | HR 71 | Temp 97.1°F | Resp 20 | Ht 61.0 in | Wt 108.0 lb

## 2022-07-12 DIAGNOSIS — I10 Essential (primary) hypertension: Secondary | ICD-10-CM

## 2022-07-12 DIAGNOSIS — E782 Mixed hyperlipidemia: Secondary | ICD-10-CM | POA: Diagnosis not present

## 2022-07-12 DIAGNOSIS — R350 Frequency of micturition: Secondary | ICD-10-CM | POA: Diagnosis not present

## 2022-07-12 DIAGNOSIS — G4700 Insomnia, unspecified: Secondary | ICD-10-CM

## 2022-07-12 DIAGNOSIS — R413 Other amnesia: Secondary | ICD-10-CM | POA: Diagnosis not present

## 2022-07-12 LAB — POCT URINALYSIS DIPSTICK
Appearance: NORMAL
Bilirubin, UA: NEGATIVE
Blood, UA: NEGATIVE
Color, UA: NORMAL
Glucose, UA: NEGATIVE
Ketones, UA: NEGATIVE
Leukocytes, UA: NEGATIVE
Nitrite, UA: NEGATIVE
Protein, UA: NEGATIVE
Spec Grav, UA: 1.01 (ref 1.010–1.025)
Urobilinogen, UA: 0.2 E.U./dL
pH, UA: 6 (ref 5.0–8.0)

## 2022-07-12 MED ORDER — CLONIDINE HCL 0.1 MG PO TABS: 0.1000 mg | ORAL_TABLET | Freq: Once | ORAL | Status: DC

## 2022-07-12 MED ORDER — DONEPEZIL HCL 5 MG PO TABS: 5.0000 mg | ORAL_TABLET | Freq: Every day | ORAL | 4 refills | Status: DC

## 2022-07-12 NOTE — Progress Notes (Unsigned)
[facility] clinic  Provider:  Durenda Age DNP  Code Status:  Full Code  Goals of Care:     06/29/2022   10:45 AM  Advanced Directives  Does Patient Have a Medical Advance Directive? No  Would patient like information on creating a medical advance directive? Yes (MAU/Ambulatory/Procedural Areas - Information given)     Chief Complaint  Patient presents with   Acute Visit    Patient is having acute memory issues    HPI: Patient is a 81 y.o. 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   Bunion 07/01/2010   Depression 05/05/2007   External hemorrhoids without mention of complication 74/25/9563   GERD (gastroesophageal reflux disease) 2000   Hyperlipidemia 06/29/2006   Memory loss 04/15/2003   Migraine without aura, without mention of intractable migraine without mention of status migrainosus 03/24/1999   Osteoporosis, unspecified 04/15/2004   Tear film insufficiency, 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 Dr.Orr    EYE SURGERY Bilateral 2010   cataract Dr. Bing Plume   VAGINAL HYSTERECTOMY  1980    Allergies  Allergen Reactions   Vioxx [Rofecoxib] Nausea Only    Pt. Can't remember what reaction was but asks to leave on her profile.     Outpatient Encounter Medications as of 07/12/2022  Medication Sig   Ascorbic  Acid (VITAMIN C) 1000 MG tablet Take 1,000 mg by mouth daily.   aspirin EC 81 MG tablet Take 1 tablet (81 mg total) by mouth daily. Swallow whole.   Calcium-Vitamins C & D (CALCIUM/C/D PO) Take by mouth.   Cholecalciferol 50 MCG (2000 UT) CAPS Take 1 tablet daily   donepezil (ARICEPT) 5 MG tablet Take 1 tablet (5 mg total) by mouth at bedtime.   gabapentin (NEURONTIN) 300 MG capsule TAKE 1 CAPSULE BY MOUTH DAILY AS NEEDED. DO NOT OPERATE A MOTOR VEHICLE AFTER TAKING THIS MEDICATION   hydrochlorothiazide (HYDRODIURIL) 25 MG tablet TAKE 1/2 TABLET BY MOUHT ONCE DAILY. Need appointment before anymore future refills.   losartan (COZAAR) 100 MG tablet Take 1 tablet (100 mg total) by mouth daily. DX I10   Multiple Vitamins-Minerals (CENTRUM ADULTS PO) Take 1 capsule by mouth daily.   Multiple Vitamins-Minerals (ICAPS AREDS FORMULA PO) Take by mouth 2 (two) times daily.   rosuvastatin (CRESTOR) 20 MG tablet Take one tablet by mouth once daily. Need appointment before anymore future refills.   traZODone (DESYREL) 50 MG tablet TAKE 1 TABLET BY MOUTH EVERYDAY AT BEDTIME   Facility-Administered Encounter Medications as of 07/12/2022  Medication   cloNIDine (CATAPRES) tablet 0.1 mg    Review of Systems:  Review of Systems  Constitutional:  Negative for appetite change, chills, fatigue and fever.  HENT:  Negative for congestion, hearing loss, rhinorrhea and sore throat.   Eyes: Negative.   Respiratory:  Negative for cough, shortness of breath and wheezing.  Cardiovascular:  Negative for chest pain, palpitations and leg swelling.  Gastrointestinal:  Positive for nausea. Negative for abdominal pain, constipation, diarrhea and vomiting.  Genitourinary:  Negative for dysuria.  Musculoskeletal:  Negative for arthralgias, back pain and myalgias.  Skin:  Negative for color change, rash and wound.  Neurological:  Negative for dizziness, weakness and headaches.  Psychiatric/Behavioral:  Negative for  behavioral problems. The patient is not nervous/anxious.     Health Maintenance  Topic Date Due   Zoster Vaccines- Shingrix (1 of 2) Never done   INFLUENZA VACCINE  03/02/2022   MAMMOGRAM  07/06/2022   COVID-19 Vaccine (6 - 2023-24 season) 08/24/2022   Medicare Annual Wellness (AWV)  06/30/2023   DTaP/Tdap/Td (3 - Td or Tdap) 11/16/2028   Pneumonia Vaccine 40+ Years old  Completed   DEXA SCAN  Completed   HPV VACCINES  Aged Out   COLONOSCOPY (Pts 45-66yr Insurance coverage will need to be confirmed)  Discontinued    Physical Exam: Vitals:   07/12/22 1407  BP: (!) 170/90  Pulse: 71  Resp: 20  Temp: (!) 97.1 F (36.2 C)  SpO2: 99%  Weight: 108 lb (49 kg)  Height: '5\' 1"'$  (1.549 m)   Body mass index is 20.41 kg/m. Physical Exam Constitutional:      Appearance: Normal appearance.  HENT:     Head: Normocephalic and atraumatic.     Nose: Nose normal.     Mouth/Throat:     Mouth: Mucous membranes are moist.  Eyes:     Conjunctiva/sclera: Conjunctivae normal.  Cardiovascular:     Rate and Rhythm: Normal rate and regular rhythm.  Pulmonary:     Effort: Pulmonary effort is normal.     Breath sounds: Normal breath sounds.  Abdominal:     General: Bowel sounds are normal.     Palpations: Abdomen is soft.  Musculoskeletal:        General: Normal range of motion.     Cervical back: Normal range of motion.  Skin:    General: Skin is warm and dry.  Neurological:     General: No focal deficit present.     Mental Status: She is alert and oriented to person, place, and time.  Psychiatric:        Mood and Affect: Mood normal.        Behavior: Behavior normal.        Thought Content: Thought content normal.        Judgment: Judgment normal.     Labs reviewed: Basic Metabolic Panel: Recent Labs    08/31/21 1153  NA 141  K 4.4  CL 103  CO2 33*  GLUCOSE 85  BUN 19  CREATININE 0.92  CALCIUM 10.3   Liver Function Tests: Recent Labs    08/31/21 1153  AST 21   ALT 16  BILITOT 0.4  PROT 6.9   No results for input(s): "LIPASE", "AMYLASE" in the last 8760 hours. No results for input(s): "AMMONIA" in the last 8760 hours. CBC: Recent Labs    08/31/21 1153  WBC 5.6  NEUTROABS 3,534  HGB 12.7  HCT 37.4  MCV 91.7  PLT 291   Lipid Panel: Recent Labs    08/31/21 1153  CHOL 217*  HDL 72  LDLCALC 120*  TRIG 137  CHOLHDL 3.0   No results found for: "HGBA1C"  Procedures since last visit: No results found.  Assessment/Plan    Labs/tests ordered:   Next appt:  07/21/2022

## 2022-07-12 NOTE — Patient Instructions (Signed)

## 2022-07-20 ENCOUNTER — Encounter: Payer: Self-pay | Admitting: Nurse Practitioner

## 2022-07-21 ENCOUNTER — Ambulatory Visit (INDEPENDENT_AMBULATORY_CARE_PROVIDER_SITE_OTHER): Payer: Medicare Other | Admitting: Nurse Practitioner

## 2022-07-21 ENCOUNTER — Encounter: Payer: Self-pay | Admitting: Nurse Practitioner

## 2022-07-21 VITALS — BP 148/68 | HR 65 | Ht 60.0 in | Wt 107.8 lb

## 2022-07-21 DIAGNOSIS — F0150 Vascular dementia without behavioral disturbance: Secondary | ICD-10-CM

## 2022-07-21 DIAGNOSIS — G4700 Insomnia, unspecified: Secondary | ICD-10-CM

## 2022-07-21 DIAGNOSIS — I10 Essential (primary) hypertension: Secondary | ICD-10-CM | POA: Diagnosis not present

## 2022-07-21 DIAGNOSIS — E782 Mixed hyperlipidemia: Secondary | ICD-10-CM

## 2022-07-21 DIAGNOSIS — M810 Age-related osteoporosis without current pathological fracture: Secondary | ICD-10-CM | POA: Diagnosis not present

## 2022-07-21 DIAGNOSIS — G629 Polyneuropathy, unspecified: Secondary | ICD-10-CM

## 2022-07-21 NOTE — Progress Notes (Signed)
Careteam: Patient Care Team: [name], NP as PCP - General (Geriatric Medicine) [name], MD as Consulting Physician (Ophthalmology) Calvert Cantor, MD as Consulting Physician (Ophthalmology) Terrance Mass, MD (Inactive) as Consulting Physician (Gynecology)  PLACE OF SERVICE:  [place]  Advanced Directive information    Allergies  Allergen Reactions   Vioxx [Rofecoxib] Nausea Only    Pt. Can't remember what reaction was but asks to leave on her profile.     Chief Complaint  Patient presents with   Medical Management of Chronic Issues    6 month follow-up. Flu vaccine today. Discuss need for shingrix and mammogram. [facility] verified.      HPI: Patient is a 81 y.o. female for routine follow up.  Daughter in law here today- states she is on too many supplements  Has back pain with radicular symptoms- she takes gabapentin 300 mg at bedtime. Unsure when she last took medication. But feels like when she takes this it makes her very loopy.    Htn- elevated today, took medication this morning. Losartan and hctz   Hyperlipidemia- continues on crestor 20 mg daily  Insomnia- took trazodone last night but does not take every night.   Memory loss has progressed-she has family support and continues to live alone. When stressed or depressed memory loss is significantly more noticeable.   Review of Systems:  Review of Systems  Constitutional:  Negative for chills, fever and weight loss.  HENT:  Negative for tinnitus.   Respiratory:  Negative for cough, sputum production and shortness of breath.   Cardiovascular:  Negative for chest pain, palpitations and leg swelling.  Gastrointestinal:  Negative for abdominal pain, constipation, diarrhea and heartburn.  Genitourinary:  Negative for dysuria, frequency and urgency.  Musculoskeletal:  Positive for back pain. Negative for falls, joint pain and myalgias.  Skin: Negative.   Neurological:  Positive for tingling and  sensory change. Negative for dizziness and headaches.  Psychiatric/Behavioral:  Positive for memory loss. Negative for depression. The patient does not have insomnia.     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 Dr.Orr    EYE SURGERY Bilateral 2010   cataract Dr. Bing Plume   VAGINAL HYSTERECTOMY  1980   Social History:   reports that she has never smoked. She has never used smokeless tobacco. She reports that she does not drink alcohol and does not use drugs.  Family History  Problem Relation Age of Onset   Alzheimer's disease Mother    Diabetes Mother    Transient ischemic attack Mother    Heart disease Mother        MI, CHF   Alcohol abuse Father    Kidney disease Father    Hypertension Sister    Hypertension Brother    Heart disease Brother    Hypertension Brother    Hypertension Sister    Hypertension  Brother    Other Son        truck accident 07/2016   Colon cancer Neg Hx    Stomach cancer Neg Hx     Medications: Patient's Medications  New Prescriptions   No medications on file  Previous Medications   ASCORBIC ACID (VITAMIN C) 1000 MG TABLET    Take 1,000 mg by mouth daily.   ASPIRIN EC 81 MG TABLET    Take 1 tablet (81 mg total) by mouth daily. Swallow whole.   CALCIUM-VITAMINS C & D  (CALCIUM/C/D PO)    Take by mouth.   CHOLECALCIFEROL 50 MCG (2000 UT) CAPS    Take 1 tablet daily   DONEPEZIL (ARICEPT) 5 MG TABLET    Take 1 tablet (5 mg total) by mouth at bedtime.   GABAPENTIN (NEURONTIN) 300 MG CAPSULE    TAKE 1 CAPSULE BY MOUTH DAILY AS NEEDED. DO NOT OPERATE A MOTOR VEHICLE AFTER TAKING THIS MEDICATION   HYDROCHLOROTHIAZIDE (HYDRODIURIL) 25 MG TABLET    TAKE 1/2 TABLET BY MOUHT ONCE DAILY. Need appointment before anymore future refills.   LOSARTAN (COZAAR) 100 MG TABLET    Take 1 tablet (100 mg total) by mouth daily. DX I10   MULTIPLE VITAMINS-MINERALS (CENTRUM ADULTS PO)    Take 1 capsule by mouth daily.   MULTIPLE VITAMINS-MINERALS (ICAPS AREDS FORMULA PO)    Take by mouth 2 (two) times daily.   ROSUVASTATIN (CRESTOR) 20 MG TABLET    Take one tablet by mouth once daily. Need appointment before anymore future refills.   TRAZODONE (DESYREL) 50 MG TABLET    TAKE 1 TABLET BY MOUTH EVERYDAY AT BEDTIME  Modified Medications   No medications on file  Discontinued Medications   No medications on file    Physical Exam:  Vitals:   07/21/22 1357  BP: (!) 148/68  Pulse: 65  SpO2: 95%  Weight: 107 lb 12.8 oz (48.9 kg)  Height: 5' (1.524 m)   Body mass index is 21.05 kg/m. Wt Readings from Last 3 Encounters:  07/21/22 107 lb 12.8 oz (48.9 kg)  07/12/22 108 lb (49 kg)  01/01/22 107 lb 14.4 oz (48.9 kg)    Physical Exam Constitutional:      General: She is not in acute distress.    Appearance: She is well-developed. She is not diaphoretic.  HENT:     Head: Normocephalic and atraumatic.     Mouth/Throat:     Pharynx: No oropharyngeal exudate.  Eyes:     Conjunctiva/sclera: Conjunctivae normal.     Pupils: Pupils are equal, round, and reactive to light.  Cardiovascular:     Rate and Rhythm: Normal rate and regular rhythm.     Heart sounds: Normal heart sounds.  Pulmonary:     Effort: Pulmonary effort is normal.     Breath sounds: Normal breath sounds.   Abdominal:     General: Bowel sounds are normal.     Palpations: Abdomen is soft.  Musculoskeletal:     Cervical back: Normal range of motion and neck supple.     Right lower leg: No edema.     Left lower leg: No edema.  Skin:    General: Skin is warm and dry.  Neurological:     Mental Status: She is alert.  Psychiatric:        Mood and Affect: Mood normal.        Cognition and Memory: Memory is impaired.     Labs reviewed: Basic Metabolic Panel:  Recent Labs    08/31/21 1153  NA 141  K 4.4  CL 103  CO2 33*  GLUCOSE 85  BUN 19  CREATININE 0.92  CALCIUM 10.3   Liver Function Tests: Recent Labs    08/31/21 1153  AST 21  ALT 16  BILITOT 0.4  PROT 6.9   No results for input(s): "LIPASE", "AMYLASE" in the last 8760 hours. No results for input(s): "AMMONIA" in the last 8760 hours. CBC: Recent Labs    08/31/21 1153  WBC 5.6  NEUTROABS 3,534  HGB 12.7  HCT 37.4  MCV 91.7  PLT 291   Lipid Panel: Recent Labs    08/31/21 1153  CHOL 217*  HDL 72  LDLCALC 120*  TRIG 137  CHOLHDL 3.0   TSH: No results for input(s): "TSH" in the last 8760 hours. A1C: No results found for: "HGBA1C" DG Lumbar Spine Complete  Result Date: 11/19/2021 CLINICAL DATA:  Low back pain radiating down left leg, worse in the morning. EXAM: LUMBAR SPINE - COMPLETE 4+ VIEW COMPARISON:  None. FINDINGS: Frontal, bilateral oblique, lateral views of the lumbar spine are obtained. There are 5 non-rib-bearing lumbar type vertebral bodies identified, with mild right convex scoliosis centered at L2. There is mild degenerative anterolisthesis of L3 on L4 and L4 on L5. No pars defects. Severe facet hypertrophic changes are seen from L3 through S1. There is mild disc space narrowing at L4-5 and L5-S1. Sacroiliac joints are unremarkable. IMPRESSION: 1. Lower lumbar spondylosis and facet hypertrophy as above. 2. Right convex scoliosis. 3. No acute bony abnormality. Electronically Signed   By: Randa Ngo M.D.   On: 11/19/2021 15:30     Assessment/Plan 1. Mixed hyperlipidemia -continues on crestor with dietary modifications.  - Lipid panel  2. Essential hypertension Elevated today in office, will plan for her to check blood pressure at home after medication has been taken and she has been sitting, to record and bring back to office for review -dash diet - COMPLETE METABOLIC PANEL WITH GFR - CBC with Differential/Platelet  3. Insomnia, unspecified type -will plan on holding trazodone to see if this contributing to increase in confusion   4. Vascular dementia without behavioral disturbance (HCC) Progressive decline, family has noticed more confusion recently, will hold trazodone and gabapentin at this time. Will follow up in 6 weeks.    5. Age-related osteoporosis without current pathological fracture Continues on prolia, Recommended to take calcium 600 mg twice daily with Vitamin D 2000 units daily and weight bearing activity 30 mins/5 days a week   6. Neuropathy Consider lower dose gabapentin, discussing holding for now to see if this improves cognition.   Return in about 6 weeks (around 09/01/2022) for follow up on memory. Carlos American. Harle Battiest  [facility] [phone]   Total time 40 mins:  time greater than 50% of total time spent doing pt counseling and coordination of care of dementia and disease progression.

## 2022-07-21 NOTE — Patient Instructions (Signed)
RECOMMENDATIONS FOR ALL PATIENTS WITH MEMORY PROBLEMS: 1. Continue to exercise (Recommend 30 minutes of walking everyday, or 3 hours every week) 2. Increase social interactions - continue going to [place] and enjoy social gatherings with friends and family 3. Eat healthy, avoid fried foods and eat more fruits and vegetables 4. Maintain adequate blood pressure, blood sugar, and blood cholesterol level. Reducing the risk of stroke and cardiovascular disease also helps promoting better memory. 5. Avoid stressful situations. Live a simple life and avoid aggravations. 6. Organize your time and prepare for the next day in anticipation. 7. Sleep well, avoid any interruptions of sleep and avoid any distractions in the bedroom that may interfere with adequate sleep quality 8. Avoid sugar, avoid sweets as there is a strong link between excessive sugar intake, diabetes, and cognitive impairment 9. Mediterranean diet, which has been shown to help patients reduce the risk of progressive memory disorders and reduces cardiovascular risk. This includes eating fish, eat fruits and green leafy vegetables, nuts like almonds and hazelnuts, walnuts, and also use olive oil. Avoid fast foods and fried foods as much as possible. Avoid sweets and sugar as sugar use has been linked to worsening of memory function.

## 2022-07-22 LAB — COMPLETE METABOLIC PANEL WITH GFR
AG Ratio: 1.8 (calc) (ref 1.0–2.5)
ALT: 16 U/L (ref 6–29)
AST: 22 U/L (ref 10–35)
Albumin: 4.5 g/dL (ref 3.6–5.1)
Alkaline phosphatase (APISO): 45 U/L (ref 37–153)
BUN/Creatinine Ratio: 23 (calc) — ABNORMAL HIGH (ref 6–22)
BUN: 22 mg/dL (ref 7–25)
CO2: 28 mmol/L (ref 20–32)
Calcium: 10.1 mg/dL (ref 8.6–10.4)
Chloride: 101 mmol/L (ref 98–110)
Creat: 0.97 mg/dL — ABNORMAL HIGH (ref 0.60–0.95)
Globulin: 2.5 g/dL (calc) (ref 1.9–3.7)
Glucose, Bld: 98 mg/dL (ref 65–99)
Potassium: 4.6 mmol/L (ref 3.5–5.3)
Sodium: 138 mmol/L (ref 135–146)
Total Bilirubin: 0.2 mg/dL (ref 0.2–1.2)
Total Protein: 7 g/dL (ref 6.1–8.1)
eGFR: 58 mL/min/{1.73_m2} — ABNORMAL LOW (ref >=60)

## 2022-07-22 LAB — CBC WITH DIFFERENTIAL/PLATELET
Absolute Monocytes: 429 cells/uL (ref 200–950)
Basophils Absolute: 59 cells/uL (ref 0–200)
Basophils Relative: 0.9 %
Eosinophils Absolute: 79 cells/uL (ref 15–500)
Eosinophils Relative: 1.2 %
HCT: 37.4 % (ref 35.0–45.0)
Hemoglobin: 12.9 g/dL (ref 11.7–15.5)
Lymphs Abs: 1406 cells/uL (ref 850–3900)
MCH: 31.7 pg (ref 27.0–33.0)
MCHC: 34.5 g/dL (ref 32.0–36.0)
MCV: 91.9 fL (ref 80.0–100.0)
MPV: 10.8 fL (ref 7.5–12.5)
Monocytes Relative: 6.5 %
Neutro Abs: 4627 cells/uL (ref 1500–7800)
Neutrophils Relative %: 70.1 %
Platelets: 303 10*3/uL (ref 140–400)
RBC: 4.07 10*6/uL (ref 3.80–5.10)
RDW: 11.8 % (ref 11.0–15.0)
Total Lymphocyte: 21.3 %
WBC: 6.6 10*3/uL (ref 3.8–10.8)

## 2022-07-22 LAB — LIPID PANEL
Cholesterol: 211 mg/dL — ABNORMAL HIGH (ref <200)
HDL: 68 mg/dL (ref >=50)
LDL Cholesterol (Calc): 111 mg/dL (calc) — ABNORMAL HIGH
Non-HDL Cholesterol (Calc): 143 mg/dL (calc) — ABNORMAL HIGH (ref <130)
Total CHOL/HDL Ratio: 3.1 (calc) (ref <5.0)
Triglycerides: 199 mg/dL — ABNORMAL HIGH (ref <150)

## 2022-07-30 ENCOUNTER — Ambulatory Visit (INDEPENDENT_AMBULATORY_CARE_PROVIDER_SITE_OTHER): Payer: Medicare Other | Admitting: Family

## 2022-07-30 ENCOUNTER — Encounter: Payer: Self-pay | Admitting: Family

## 2022-07-30 VITALS — BP 138/70 | HR 65 | Temp 97.1°F | Resp 16 | Ht 60.0 in | Wt 105.6 lb

## 2022-07-30 DIAGNOSIS — R300 Dysuria: Secondary | ICD-10-CM | POA: Diagnosis not present

## 2022-07-30 LAB — POCT URINALYSIS DIPSTICK
Bilirubin, UA: NEGATIVE
Glucose, UA: NEGATIVE
Ketones, UA: POSITIVE
Protein, UA: POSITIVE — AB
Spec Grav, UA: >=1.03 — AB (ref 1.010–1.025)
Urobilinogen, UA: NEGATIVE E.U./dL — AB
pH, UA: 5 (ref 5.0–8.0)

## 2022-07-30 NOTE — Progress Notes (Signed)
Provider: Meerab Maselli FNP-C  [name], NP  Patient Care Team: [name], NP as PCP - General (Geriatric Medicine) [name], MD as Consulting Physician (Ophthalmology) Calvert Cantor, MD as Consulting Physician (Ophthalmology) Terrance Mass, MD (Inactive) as Consulting Physician (Gynecology)  Extended Emergency Contact Information Primary Emergency Contact: Carma Lair of [place] Phone: [phone] Relation: Son Secondary Emergency Contact: [place] Phone: [phone] Relation: Other  Code Status:  Full Code  Goals of care: Advanced Directive information    07/30/2022   10:27 AM  Advanced Directives  Does Patient Have a Medical Advance Directive? No  Would patient like information on creating a medical advance directive? No - Patient declined     Chief Complaint  Patient presents with   Acute Visit    Patient complains of possible UTI and having stomach pain. Symptoms of UTI are dysuria.     HPI:  Pt is a 81 y.o. female seen today for an acute visit for evaluation of dysuria and abdominal pain x 2 days.Feeling a little better today.Had diarrhea few days ago but has improved.Had normal bowel this morning. 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 Dr.Orr    EYE SURGERY Bilateral 2010   cataract Dr. Bing Plume   VAGINAL HYSTERECTOMY  1980    Allergies  Allergen Reactions   Aricept [Donepezil] Diarrhea   Vioxx [Rofecoxib] Nausea Only    Pt. Can't remember what reaction was but asks to leave on her profile.     Outpatient Encounter Medications as of 07/30/2022  Medication Sig   aspirin EC 81 MG tablet Take 1 tablet (81 mg total) by mouth daily. Swallow whole.   Cholecalciferol 50 MCG (2000 UT) CAPS Take 1 tablet daily   gabapentin (NEURONTIN) 300 MG capsule TAKE 1 CAPSULE BY MOUTH DAILY AS NEEDED. DO NOT OPERATE A MOTOR VEHICLE AFTER TAKING THIS MEDICATION   hydrochlorothiazide (HYDRODIURIL) 25 MG tablet TAKE 1/2 TABLET BY MOUHT ONCE DAILY. Need appointment before anymore future refills.   losartan (COZAAR) 100 MG tablet Take 1 tablet (100 mg total) by mouth daily. DX I10   Multiple Vitamins-Minerals (ICAPS AREDS FORMULA PO) Take by mouth 2 (two) times daily.   rosuvastatin (CRESTOR) 20 MG tablet Take one tablet by mouth once daily. Need appointment before anymore future refills.   traZODone (DESYREL) 50 MG tablet TAKE 1 TABLET BY MOUTH EVERYDAY AT BEDTIME   No facility-administered encounter medications on file as of 07/30/2022.    Review of Systems  Constitutional:  Negative for appetite change, chills, fatigue, fever and unexpected weight change.  Respiratory:  Negative for cough, chest tightness, shortness of breath and wheezing.   Cardiovascular:  Negative for chest pain, palpitations and leg swelling.  Gastrointestinal:  Positive for abdominal pain. Negative for abdominal distention, blood in  stool, constipation, diarrhea, nausea and vomiting.  Endocrine: Negative for cold intolerance, heat intolerance, polydipsia, polyphagia and polyuria.  Genitourinary:  Positive for dysuria.  Negative for difficulty urinating, flank pain, frequency and urgency.  Psychiatric/Behavioral:  Negative for agitation, behavioral problems, confusion and hallucinations. The patient is not nervous/anxious.    Immunization History  Administered Date(s) Administered   Covid-19, Mrna,Vaccine(Spikevax)41yr and older 06/29/2022   Fluad Quad(high Dose 65+) 04/26/2019, 04/21/2020   Influenza, High Dose Seasonal PF 03/11/2017, 06/09/2018   Influenza,inj,Quad PF,6+ Mos 07/19/2013, 05/28/2016, 06/02/2017   Influenza,inj,quad, With Preservative 06/08/2021   Influenza-Unspecified 05/23/2015   PFIZER(Purple Top)SARS-COV-2 Vaccination 08/22/2019, 09/12/2019, 05/29/2020   PPD Test 11/30/2013, 05/30/2015   Pfizer Covid-19 Vaccine Bivalent Booster 144yr& up 06/08/2021   Pneumococcal Conjugate-13 12/24/2014, 05/28/2016   Pneumococcal Polysaccharide-23 06/09/2018   Tdap 03/11/2017, 11/17/2018   Zoster, Live 11/19/2009   Pertinent  Health Maintenance Due  Topic Date Due   INFLUENZA VACCINE  03/02/2022   MAMMOGRAM  07/06/2022   DEXA SCAN  Completed   COLONOSCOPY (Pts [id number] coverage will need to be confirmed)  Discontinued      11/17/2021    2:46 PM 01/01/2022   11:10 AM 06/29/2022   10:43 AM 07/21/2022    2:12 PM 07/30/2022   10:26 AM  Fall Risk  Falls in the past year? 0 0 0 0 0  Was there an injury with Fall? 0 0 0 0 0  Fall Risk Category Calculator 0 0 0 0 0  Fall Risk Category Low Low Low Low Low  Patient Fall Risk Level Low fall risk Low fall risk Low fall risk Low fall risk Low fall risk  Patient at Risk for Falls Due to No Fall Risks No Fall Risks No Fall Risks No Fall Risks No Fall Risks  Fall risk Follow up Falls evaluation completed Falls evaluation completed Falls evaluation completed Falls evaluation completed Falls evaluation completed   Functional Status Survey:    Vitals:   07/30/22 1019  BP: 138/70  Resp: 16  Temp: (!) 97.1 F (36.2 C)  Weight: 105 lb 9.6 oz  (47.9 kg)  Height: 5' (1.524 m)   Body mass index is 20.62 kg/m. Physical Exam Vitals reviewed.  Constitutional:      General: She is not in acute distress.    Appearance: Normal appearance. She is normal weight. She is not ill-appearing or diaphoretic.  HENT:     Head: Normocephalic.     Nose: Nose normal. No congestion or rhinorrhea.     Mouth/Throat:     Mouth: Mucous membranes are moist.     Pharynx: Oropharynx is clear. No oropharyngeal exudate or posterior oropharyngeal erythema.  Eyes:     General: No scleral icterus.       Right eye: No discharge.        Left eye: No discharge.     Conjunctiva/sclera: Conjunctivae normal.     Pupils: Pupils are equal, round, and reactive to light.  Cardiovascular:     Rate and Rhythm: Normal rate and regular rhythm.     Pulses: Normal pulses.     Heart sounds: Normal heart sounds. No murmur heard.    No friction rub. No gallop.  Pulmonary:     Effort: Pulmonary effort is normal. No respiratory distress.     Breath sounds: Normal breath sounds. No wheezing, rhonchi or rales.  Chest:     Chest wall: No tenderness.  Abdominal:     General: Bowel sounds are normal.  There is no distension.     Palpations: Abdomen is soft. There is no mass.     Tenderness: There is no abdominal tenderness. There is no right CVA tenderness, left CVA tenderness, guarding or rebound.  Skin:    General: Skin is warm and dry.     Coloration: Skin is not pale.     Findings: No erythema or rash.  Neurological:     Mental Status: She is alert. Mental status is at baseline.     Gait: Gait abnormal.  Psychiatric:        Mood and Affect: Mood normal.        Speech: Speech normal.        Behavior: Behavior normal.     Labs reviewed: Recent Labs    08/31/21 1153 07/21/22 1454  NA 141 138  K 4.4 4.6  CL 103 101  CO2 33* 28  GLUCOSE 85 98  BUN 19 22  CREATININE 0.92 0.97*  CALCIUM 10.3 10.1   Recent Labs    08/31/21 1153 07/21/22 1454  AST 21 22   ALT 16 16  BILITOT 0.4 0.2  PROT 6.9 7.0   Recent Labs    08/31/21 1153 07/21/22 1454  WBC 5.6 6.6  NEUTROABS 3,534 4,627  HGB 12.7 12.9  HCT 37.4 37.4  MCV 91.7 91.9  PLT 291 303   Lab Results  Component Value Date   TSH 3.71 04/21/2020   No results found for: "HGBA1C" Lab Results  Component Value Date   CHOL 211 (H) 07/21/2022   HDL 68 07/21/2022   LDLCALC 111 (H) 07/21/2022   TRIG 199 (H) 07/21/2022   CHOLHDL 3.1 07/21/2022    Significant Diagnostic Results in last 30 days:  No results found.  Assessment/Plan  Dysuria Afebrile  - POC Urinalysis Dipstick indicates the yellow very cloudy urine positive for ketones, moderate blood and leukocytes but negative for nitrites.Results reviewed with patient will send urine for culture then treat if indicated. -Encouraged to increase fluid intake -Notify provider if symptoms worsen - Culture, Urine  Family/ staff Communication: Reviewed plan of care with patient verbalized understanding  Labs/tests ordered:  - POC Urinalysis Dipstick  - Urine Culture  Next Appointment:Return if symptoms worsen or fail to improve.   Sandrea Hughs, NP

## 2022-08-02 LAB — URINE CULTURE
MICRO NUMBER:: 14370504
SPECIMEN QUALITY:: ADEQUATE

## 2022-08-03 ENCOUNTER — Encounter: Payer: Self-pay | Admitting: Nurse Practitioner

## 2022-08-03 DIAGNOSIS — F0150 Vascular dementia without behavioral disturbance: Secondary | ICD-10-CM

## 2022-08-03 NOTE — Telephone Encounter (Signed)
Caroline Snyder saw her in regards to her urine,  Her labs have already been results in regards to her cholesterol- just to make sure she is taking her medication daily

## 2022-08-04 ENCOUNTER — Other Ambulatory Visit: Payer: Self-pay

## 2022-08-04 DIAGNOSIS — B9620 Unspecified Escherichia coli [E. coli] as the cause of diseases classified elsewhere: Secondary | ICD-10-CM

## 2022-08-04 DIAGNOSIS — K521 Toxic gastroenteritis and colitis: Secondary | ICD-10-CM

## 2022-08-04 MED ORDER — CIPROFLOXACIN HCL 500 MG PO TABS: 500.0000 mg | ORAL_TABLET | Freq: Two times a day (BID) | ORAL | 0 refills | Status: AC

## 2022-08-04 MED ORDER — SACCHAROMYCES BOULARDII 250 MG PO CAPS: 250.0000 mg | ORAL_CAPSULE | Freq: Two times a day (BID) | ORAL | 0 refills | Status: AC

## 2022-08-17 ENCOUNTER — Encounter: Payer: Self-pay | Admitting: Gastroenterology

## 2022-08-30 ENCOUNTER — Other Ambulatory Visit: Payer: Self-pay | Admitting: Nurse Practitioner

## 2022-09-03 ENCOUNTER — Ambulatory Visit: Payer: Medicare Other | Admitting: Nurse Practitioner

## 2022-09-14 ENCOUNTER — Other Ambulatory Visit: Payer: Self-pay | Admitting: Nurse Practitioner

## 2022-09-14 DIAGNOSIS — G4700 Insomnia, unspecified: Secondary | ICD-10-CM

## 2022-09-21 ENCOUNTER — Ambulatory Visit: Payer: Medicare Other

## 2022-09-22 ENCOUNTER — Encounter: Payer: Self-pay | Admitting: Diagnostic Neuroimaging

## 2022-09-22 ENCOUNTER — Ambulatory Visit (INDEPENDENT_AMBULATORY_CARE_PROVIDER_SITE_OTHER): Payer: Medicare Other | Admitting: Diagnostic Neuroimaging

## 2022-09-22 VITALS — BP 142/74 | HR 72 | Ht 60.0 in | Wt 105.0 lb

## 2022-09-22 DIAGNOSIS — F03A0 Unspecified dementia, mild, without behavioral disturbance, psychotic disturbance, mood disturbance, and anxiety: Secondary | ICD-10-CM

## 2022-09-22 MED ORDER — MEMANTINE HCL 10 MG PO TABS: 10.0000 mg | ORAL_TABLET | Freq: Two times a day (BID) | ORAL | 12 refills | Status: DC

## 2022-09-22 NOTE — Patient Instructions (Signed)
MEMORY LOSS (mild dementia; MMSE 19/30; mild changes in ADLs) - start memantine 60m at bedtime; increase to twice a day after 1-2 weeks - safety / supervision issues reviewed - daily physical activity / exercise (at least 15-30 minutes) - eat more plants / vegetables - increase social activities, brain stimulation, games, puzzles, hobbies, crafts, arts, music - aim for at least 7-8 hours sleep per night (or more) - avoid smoking and alcohol - caregiver resources provided ([email or web address] - caution with medications, finances; recommend to stop driving
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
## 2022-09-23 LAB — VITAMIN B12: Vitamin B-12: 502 pg/mL (ref 232–1245)

## 2022-09-26 ENCOUNTER — Other Ambulatory Visit: Payer: Self-pay | Admitting: Nurse Practitioner

## 2022-09-26 DIAGNOSIS — I10 Essential (primary) hypertension: Secondary | ICD-10-CM

## 2022-10-01 ENCOUNTER — Encounter: Payer: Self-pay | Admitting: Nurse Practitioner

## 2022-10-01 ENCOUNTER — Ambulatory Visit (INDEPENDENT_AMBULATORY_CARE_PROVIDER_SITE_OTHER): Payer: Medicare Other | Admitting: Nurse Practitioner

## 2022-10-01 VITALS — BP 138/84 | HR 66 | Temp 97.9°F | Ht 60.0 in | Wt 106.0 lb

## 2022-10-01 DIAGNOSIS — G629 Polyneuropathy, unspecified: Secondary | ICD-10-CM | POA: Diagnosis not present

## 2022-10-01 DIAGNOSIS — M810 Age-related osteoporosis without current pathological fracture: Secondary | ICD-10-CM | POA: Diagnosis not present

## 2022-10-01 DIAGNOSIS — G4700 Insomnia, unspecified: Secondary | ICD-10-CM

## 2022-10-01 DIAGNOSIS — F0150 Vascular dementia without behavioral disturbance: Secondary | ICD-10-CM | POA: Diagnosis not present

## 2022-10-01 DIAGNOSIS — I10 Essential (primary) hypertension: Secondary | ICD-10-CM | POA: Diagnosis not present

## 2022-10-01 DIAGNOSIS — E782 Mixed hyperlipidemia: Secondary | ICD-10-CM

## 2022-10-01 DIAGNOSIS — F339 Major depressive disorder, recurrent, unspecified: Secondary | ICD-10-CM | POA: Diagnosis not present

## 2022-10-01 MED ORDER — DENOSUMAB 60 MG/ML ~~LOC~~ SOSY
60.0000 mg | PREFILLED_SYRINGE | Freq: Once | SUBCUTANEOUS | Status: AC
  Administered 2022-10-01: 60 mg via SUBCUTANEOUS

## 2022-10-01 NOTE — Patient Instructions (Signed)
Schedule follow up in 6 months and a day to also get prolia injection at time of appt

## 2022-10-01 NOTE — Progress Notes (Signed)
Careteam: Patient Care Team: [name], NP as PCP - General (Geriatric Medicine) [name], MD as Consulting Physician (Ophthalmology) Calvert Cantor, MD as Consulting Physician (Ophthalmology) Terrance Mass, MD (Inactive) as Consulting Physician (Gynecology)  PLACE OF SERVICE:  [place]  Advanced Directive information    Allergies  Allergen Reactions   Aricept [Donepezil] Diarrhea   Vioxx [Rofecoxib] Nausea Only    Pt. Can't remember what reaction was but asks to leave on her profile.     Chief Complaint  Patient presents with   Follow-up    2-3 month follow-up on memory. Discuss need for mammogram, shingrix, flu vaccine, and additional covid boosters or post pone if patient refuses. Prolia injection. Patient repeated on several occasions that her memory is gone. Patient was unable to give a clear history and was confused about vaccine history.      HPI: Patient is a 82 y.o. female for routine follow up.   Hyperlipidemia- states she is taking crestor, may occasionally forget medication.   Does not know if she is taking the namenda for her memory or not. Followed up with neurologist last week- reports her daughter in law went with her.   She is alone today, drove.   Repeats that her memory is gone. Feels comfortable driving.  She reports she does not cook but does not have trouble getting herself meals.   Got prolia today No body aches or pains  Hx of headaches but they have gone away  Feels like she is sleeping well at night.   Mood has been stable.    Review of Systems:  Review of Systems  Constitutional:  Negative for chills, fever and weight loss.  HENT:  Negative for tinnitus.   Respiratory:  Negative for cough, sputum production and shortness of breath.   Cardiovascular:  Negative for chest pain, palpitations and leg swelling.  Gastrointestinal:  Negative for abdominal pain, constipation, diarrhea and heartburn.  Genitourinary:   Negative for dysuria, frequency and urgency.  Musculoskeletal:  Negative for back pain, falls, joint pain and myalgias.  Skin: Negative.   Neurological:  Negative for dizziness and headaches.  Psychiatric/Behavioral:  Negative for depression and memory loss. The patient does not have insomnia.     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 Dr.Orr    EYE SURGERY Bilateral 2010   cataract Dr. Bing Plume   VAGINAL HYSTERECTOMY  1980   Social History:   reports that she has never smoked. She has never used smokeless tobacco. She reports that she does not drink alcohol and does not use drugs.  Family History  Problem Relation Age of Onset   Alzheimer's disease Mother    Diabetes Mother    Transient ischemic attack Mother    Heart disease Mother        MI, CHF   Alcohol abuse Father  Kidney disease Father    Hypertension Sister    Hypertension Brother    Heart disease Brother    Hypertension Brother    Hypertension Sister    Hypertension Brother    Other Son        truck accident 07/2016   Colon cancer Neg Hx    Stomach cancer Neg Hx     Medications: Patient's Medications  New Prescriptions   No medications on file  Previous Medications   ASPIRIN EC 81 MG TABLET    Take 1 tablet (81 mg total) by  mouth daily. Swallow whole.   CHOLECALCIFEROL 50 MCG (2000 UT) CAPS    Take 1 tablet daily   HYDROCHLOROTHIAZIDE (HYDRODIURIL) 25 MG TABLET    TAKE 1/2 TABLET BY MOUHT ONCE DAILY. NEED APPOINTMENT BEFORE ANYMORE FUTURE REFILLS.   LOSARTAN (COZAAR) 100 MG TABLET    Take 1 tablet (100 mg total) by mouth daily. DX I10   MEMANTINE (NAMENDA) 10 MG TABLET    Take 1 tablet (10 mg total) by mouth 2 (two) times daily.   ROSUVASTATIN (CRESTOR) 20 MG TABLET    TAKE ONE TABLET BY MOUTH ONCE DAILY. NEED APPOINTMENT BEFORE ANYMORE FUTURE REFILLS.  Modified Medications   No medications on file  Discontinued Medications   No medications on file    Physical Exam:  Vitals:   10/01/22 1037  BP: 138/84  Pulse: 66  Temp: 97.9 F (36.6 C)  TempSrc: Temporal  SpO2: 97%  Weight: 106 lb (48.1 kg)  Height: 5' (1.524 m)   Body mass index is 20.7 kg/m. Wt Readings from Last 3 Encounters:  10/01/22 106 lb (48.1 kg)  09/22/22 105 lb (47.6 kg)  07/30/22 105 lb 9.6 oz (47.9 kg)    Physical Exam Constitutional:      General: She is not in acute distress.    Appearance: She is well-developed. She is not diaphoretic.  HENT:     Head: Normocephalic and atraumatic.     Mouth/Throat:     Pharynx: No oropharyngeal exudate.  Eyes:     Conjunctiva/sclera: Conjunctivae normal.     Pupils: Pupils are equal, round, and reactive to light.  Cardiovascular:     Rate and Rhythm: Normal rate and regular rhythm.     Heart sounds: Normal heart sounds.  Pulmonary:     Effort: Pulmonary effort is normal.     Breath sounds: Normal breath sounds.  Abdominal:     General: Bowel sounds are normal.     Palpations: Abdomen is soft.  Musculoskeletal:     Cervical back: Normal range of motion and neck supple.     Right lower leg: No edema.     Left lower leg: No edema.  Skin:    General: Skin is warm and dry.  Neurological:     Mental Status: She is alert.  Psychiatric:        Mood and Affect: Mood normal.      Labs reviewed: Basic Metabolic Panel: Recent Labs    07/21/22 1454  NA 138  K 4.6  CL 101  CO2 28  GLUCOSE 98  BUN 22  CREATININE 0.97*  CALCIUM 10.1   Liver Function Tests: Recent Labs    07/21/22 1454  AST 22  ALT 16  BILITOT 0.2  PROT 7.0   No results for input(s): "LIPASE", "AMYLASE" in the last 8760 hours. No results for input(s): "AMMONIA" in the last 8760 hours. CBC: Recent Labs    07/21/22 1454  WBC 6.6  NEUTROABS 4,627  HGB 12.9  HCT 37.4  MCV 91.9  PLT 303   Lipid Panel: Recent Labs    07/21/22 1454  CHOL 211*  HDL 68  LDLCALC 111*  TRIG 199*  CHOLHDL 3.1   TSH: No results for input(s): "TSH" in the last 8760 hours. A1C: No results found for: "HGBA1C"   Assessment/Plan 1. Age-related osteoporosis without current pathological fracture Continue vit d supplements with weight bearing exercise.  - denosumab (PROLIA) injection 60 mg  2. Vascular dementia without behavioral disturbance (HCC) Stable, no acute changes in cognitive or functional status, continue supportive care with family Followed by neurology and continues on namenda twice daily   3. Mixed hyperlipidemia -continue on crestor with dietary modifications.   4. Essential hypertension -Blood pressure well controlled, goal bp <140/90 Continue current medications and dietary modifications follow metabolic panel  5. Depression, recurrent ([place]) Stable off medications.   6. Insomnia, unspecified type -overall doing well at this time. No longer on medication.   7. Neuropathy -medication has been stopped due to increase fatigue and worsening confusion. Reports occasionally will have symptoms but overall stable.   Return in about 6 months (around 04/03/2023) for routine follow up .  Carlos American. [place] [phone]

## 2022-10-14 ENCOUNTER — Other Ambulatory Visit: Payer: Self-pay | Admitting: Diagnostic Neuroimaging

## 2022-10-20 ENCOUNTER — Ambulatory Visit
Admission: RE | Admit: 2022-10-20 | Discharge: 2022-10-20 | Disposition: A | Discharge status: Home / Self Care | Payer: Medicare Other | Source: Ambulatory Visit | Attending: Diagnostic Neuroimaging | Admitting: Diagnostic Neuroimaging

## 2022-10-20 DIAGNOSIS — F0390 Unspecified dementia without behavioral disturbance: Secondary | ICD-10-CM | POA: Diagnosis not present

## 2022-10-20 DIAGNOSIS — F03A0 Unspecified dementia, mild, without behavioral disturbance, psychotic disturbance, mood disturbance, and anxiety: Secondary | ICD-10-CM

## 2022-10-20 DIAGNOSIS — R413 Other amnesia: Secondary | ICD-10-CM | POA: Diagnosis not present

## 2022-10-22 ENCOUNTER — Other Ambulatory Visit: Payer: Self-pay | Admitting: Nurse Practitioner

## 2022-10-26 ENCOUNTER — Encounter: Payer: Self-pay | Admitting: Neurology

## 2022-10-27 DIAGNOSIS — H04123 Dry eye syndrome of bilateral lacrimal glands: Secondary | ICD-10-CM | POA: Diagnosis not present

## 2022-11-14 ENCOUNTER — Other Ambulatory Visit: Payer: Self-pay | Admitting: Nurse Practitioner

## 2022-11-14 DIAGNOSIS — I10 Essential (primary) hypertension: Secondary | ICD-10-CM

## 2022-12-06 ENCOUNTER — Other Ambulatory Visit: Payer: Self-pay | Admitting: Adult Health

## 2022-12-06 DIAGNOSIS — R413 Other amnesia: Secondary | ICD-10-CM

## 2023-02-16 DIAGNOSIS — L239 Allergic contact dermatitis, unspecified cause: Secondary | ICD-10-CM | POA: Diagnosis not present

## 2023-02-16 DIAGNOSIS — L821 Other seborrheic keratosis: Secondary | ICD-10-CM | POA: Diagnosis not present

## 2023-03-03 DIAGNOSIS — L309 Dermatitis, unspecified: Secondary | ICD-10-CM | POA: Diagnosis not present

## 2023-03-03 DIAGNOSIS — L820 Inflamed seborrheic keratosis: Secondary | ICD-10-CM | POA: Diagnosis not present

## 2023-03-03 DIAGNOSIS — D485 Neoplasm of uncertain behavior of skin: Secondary | ICD-10-CM | POA: Diagnosis not present

## 2023-03-03 DIAGNOSIS — L298 Other pruritus: Secondary | ICD-10-CM | POA: Diagnosis not present

## 2023-03-19 DIAGNOSIS — Z23 Encounter for immunization: Secondary | ICD-10-CM | POA: Diagnosis not present

## 2023-03-23 ENCOUNTER — Other Ambulatory Visit: Payer: Self-pay | Admitting: Adult Health

## 2023-03-23 DIAGNOSIS — G4700 Insomnia, unspecified: Secondary | ICD-10-CM

## 2023-03-23 NOTE — Telephone Encounter (Signed)
Requested medication is not on medication list

## 2023-03-31 DIAGNOSIS — L821 Other seborrheic keratosis: Secondary | ICD-10-CM | POA: Diagnosis not present

## 2023-03-31 DIAGNOSIS — L538 Other specified erythematous conditions: Secondary | ICD-10-CM | POA: Diagnosis not present

## 2023-03-31 DIAGNOSIS — B079 Viral wart, unspecified: Secondary | ICD-10-CM | POA: Diagnosis not present

## 2023-03-31 DIAGNOSIS — D492 Neoplasm of unspecified behavior of bone, soft tissue, and skin: Secondary | ICD-10-CM | POA: Diagnosis not present

## 2023-03-31 DIAGNOSIS — R208 Other disturbances of skin sensation: Secondary | ICD-10-CM | POA: Diagnosis not present

## 2023-03-31 DIAGNOSIS — D485 Neoplasm of uncertain behavior of skin: Secondary | ICD-10-CM | POA: Diagnosis not present

## 2023-04-08 ENCOUNTER — Encounter: Payer: Self-pay | Admitting: Nurse Practitioner

## 2023-04-08 ENCOUNTER — Ambulatory Visit (INDEPENDENT_AMBULATORY_CARE_PROVIDER_SITE_OTHER): Payer: Medicare Other | Admitting: Nurse Practitioner

## 2023-04-08 VITALS — BP 134/80 | HR 70 | Temp 97.5°F | Ht 60.0 in | Wt 98.4 lb

## 2023-04-08 DIAGNOSIS — I10 Essential (primary) hypertension: Secondary | ICD-10-CM | POA: Diagnosis not present

## 2023-04-08 DIAGNOSIS — E782 Mixed hyperlipidemia: Secondary | ICD-10-CM | POA: Diagnosis not present

## 2023-04-08 DIAGNOSIS — R634 Abnormal weight loss: Secondary | ICD-10-CM | POA: Diagnosis not present

## 2023-04-08 DIAGNOSIS — F0150 Vascular dementia without behavioral disturbance: Secondary | ICD-10-CM | POA: Diagnosis not present

## 2023-04-08 DIAGNOSIS — M810 Age-related osteoporosis without current pathological fracture: Secondary | ICD-10-CM

## 2023-04-08 MED ORDER — DENOSUMAB 60 MG/ML ~~LOC~~ SOSY
60.0000 mg | PREFILLED_SYRINGE | Freq: Once | SUBCUTANEOUS | Status: AC
  Administered 2023-04-08: 60 mg via SUBCUTANEOUS

## 2023-04-08 NOTE — Patient Instructions (Signed)
Make sure you are eating 3 meals a day Add nutritional supplement to smallest meal

## 2023-04-08 NOTE — Progress Notes (Signed)
Careteam: [name], NP as PCP - General (Geriatric Medicine) [name], MD as Consulting Physician (Ophthalmology) [name], MD as Consulting Physician (Ophthalmology) [name], MD (Inactive) as Consulting Physician (Gynecology)  PLACE OF SERVICE:  [facility]  Advanced Directive information    Allergies  Allergen Reactions   Aricept [Donepezil] Diarrhea   Vioxx [Rofecoxib] Nausea Only    Pt. Can't remember what reaction was but asks to leave on her profile.     Chief Complaint  Patient presents with   Medication Management     6 month follow up. Prolia injection, Discuss need for Shingrix vaccine, Covid vaccine     HPI: Patient is a 82 y.o. female for routine follow up.   Osteoporosis- received prolia injection today  Reports memory is going- still driving. She has been seeing by neurology.   Htn- controlled on losartan and hydrochlorothiazide   She has lost weight- she goes to her sons house and he gives her food.  Eats breakfast at home - eats bacon, egg sandwich   Hyperlipidemia- continues on crestor.   Reports she continues to dance but not like she used to.    Review of Systems:  Review of Systems  Constitutional:  Negative for chills, fever and weight loss.  HENT:  Negative for tinnitus.   Respiratory:  Negative for cough, sputum production and shortness of breath.   Cardiovascular:  Negative for chest pain, palpitations and leg swelling.  Gastrointestinal:  Negative for abdominal pain, constipation, diarrhea and heartburn.  Genitourinary:  Negative for dysuria, frequency and urgency.  Musculoskeletal:  Negative for back pain, falls, joint pain and myalgias.  Skin: Negative.   Neurological:  Negative for dizziness and headaches.  Psychiatric/Behavioral:  Positive for memory loss. Negative for depression. The patient does not have insomnia.     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 Dr.Orr    EYE SURGERY Bilateral 2010   cataract Dr. Hazle Quant   VAGINAL HYSTERECTOMY  1980   Social History:   reports that she has never smoked. She has never used smokeless tobacco. She reports that she does not drink alcohol and does not use drugs.  Family History  Problem Relation Age of Onset   Alzheimer's disease Mother    Diabetes Mother    Transient ischemic attack Mother    Heart disease Mother        MI, CHF   Alcohol abuse Father    Kidney disease Father    Hypertension Sister    Hypertension Brother    Heart disease Brother    Hypertension Brother    Hypertension Sister    Hypertension Brother    Other Son        truck accident 07/2016   Colon cancer Neg Hx    Stomach cancer Neg Hx     Medications: Patient's  Medications  New Prescriptions   No medications on file  Previous Medications   ASPIRIN EC 81 MG TABLET    Take 1 tablet (81 mg total) by mouth daily. Swallow whole.   CHOLECALCIFEROL 50 MCG (2000 UT) CAPS    Take 1 tablet daily   HYDROCHLOROTHIAZIDE (HYDRODIURIL) 25 MG TABLET    TAKE 1/2 TABLET BY MOUHT ONCE DAILY.   LOSARTAN (COZAAR) 100 MG TABLET    Take 1 tablet (100 mg total) by mouth daily. DX I10   MEMANTINE (NAMENDA) 10 MG TABLET    Take 1 tablet (10 mg total) by mouth 2 (two)  times daily.   ROSUVASTATIN (CRESTOR) 20 MG TABLET    Take one tablet by mouth once daily.  Modified Medications   No medications on file  Discontinued Medications   No medications on file    Physical Exam:  Vitals:   04/08/23 1303  BP: 134/80  Pulse: 70  Temp: (!) 97.5 F (36.4 C)  TempSrc: Temporal  SpO2: 98%  Weight: 98 lb 6.4 oz (44.6 kg)  Height: 5' (1.524 m)   Body mass index is 19.22 kg/m. Wt Readings from Last 3 Encounters:  04/08/23 98 lb 6.4 oz (44.6 kg)  10/01/22 106 lb (48.1 kg)  09/22/22 105 lb (47.6 kg)    Physical Exam Constitutional:      General: She is not in acute distress.    Appearance: She is well-developed. She is not diaphoretic.  HENT:     Head: Normocephalic and atraumatic.     Mouth/Throat:     Pharynx: No oropharyngeal exudate.  Eyes:     Conjunctiva/sclera: Conjunctivae normal.     Pupils: Pupils are equal, round, and reactive to light.  Cardiovascular:     Rate and Rhythm: Normal rate and regular rhythm.     Heart sounds: Normal heart sounds.  Pulmonary:     Effort: Pulmonary effort is normal.     Breath sounds: Normal breath sounds.  Abdominal:     General: Bowel sounds are normal.     Palpations: Abdomen is soft.  Musculoskeletal:     Cervical back: Normal range of motion and neck supple.     Right lower leg: No edema.     Left lower leg: No edema.  Skin:    General: Skin is warm and dry.  Neurological:     Mental Status: She is alert.     Motor: No weakness.     Gait: Gait normal.  Psychiatric:        Mood and Affect: Mood normal.     Labs reviewed: Basic Metabolic Panel: Recent Labs    07/21/22 1454  NA 138  K 4.6  CL 101  CO2 28  GLUCOSE 98  BUN 22  CREATININE 0.97*  CALCIUM 10.1   Liver Function Tests: Recent Labs    07/21/22 1454  AST 22  ALT 16  BILITOT 0.2  PROT 7.0   No results for input(s): "LIPASE", "AMYLASE" in the last 8760 hours. No results for input(s): "AMMONIA" in the last 8760  hours. CBC: Recent Labs    07/21/22 1454  WBC 6.6  NEUTROABS 4,627  HGB 12.9  HCT 37.4  MCV 91.9  PLT 303   Lipid Panel: Recent Labs    07/21/22 1454  CHOL 211*  HDL 68  LDLCALC 111*  TRIG 199*  CHOLHDL 3.1   TSH: No results for input(s): "TSH" in the last 8760 hours. A1C: No results found for: "HGBA1C"  Assessment/Plan 1. Age-related osteoporosis without current pathological fracture -continues cal and vit d - denosumab (PROLIA) injection 60 mg  2. Vascular dementia without behavioral disturbance (HCC) -Stable, no acute changes in cognitive or functional status, continue supportive care.  Continues on namenda twice daily   3. Mixed hyperlipidemia -continues on crestor - Lipid panel - COMPLETE METABOLIC PANEL WITH GFR  4. Essential hypertension -Blood pressure well controlled, goal bp <140/90 Continue current medications and dietary modifications follow metabolic panel - COMPLETE METABOLIC PANEL WITH GFR - CBC with Differential/Platelet  5. Loss of weight -encouraged to liberalize diet. To have protein supplement in addition to smallest meal of the day.   Return in about 3 months (around 07/08/2023) for weight loss .  Janene Harvey. Biagio [facility] [phone]

## 2023-04-09 LAB — COMPLETE METABOLIC PANEL WITH GFR
AG Ratio: 2.1 (calc) (ref 1.0–2.5)
ALT: 12 U/L (ref 6–29)
AST: 18 U/L (ref 10–35)
Albumin: 4.5 g/dL (ref 3.6–5.1)
Alkaline phosphatase (APISO): 41 U/L (ref 37–153)
BUN/Creatinine Ratio: 22 (calc) (ref 6–22)
BUN: 22 mg/dL (ref 7–25)
CO2: 23 mmol/L (ref 20–32)
Calcium: 9.4 mg/dL (ref 8.6–10.4)
Chloride: 98 mmol/L (ref 98–110)
Creat: 1 mg/dL — ABNORMAL HIGH (ref 0.60–0.95)
Globulin: 2.1 g/dL (calc) (ref 1.9–3.7)
Glucose, Bld: 87 mg/dL (ref 65–139)
Potassium: 4.1 mmol/L (ref 3.5–5.3)
Sodium: 133 mmol/L — ABNORMAL LOW (ref 135–146)
Total Bilirubin: 0.3 mg/dL (ref 0.2–1.2)
Total Protein: 6.6 g/dL (ref 6.1–8.1)
eGFR: 56 mL/min/{1.73_m2} — ABNORMAL LOW (ref >=60)

## 2023-04-09 LAB — CBC WITH DIFFERENTIAL/PLATELET
Absolute Monocytes: 504 cells/uL (ref 200–950)
Basophils Absolute: 69 {cells}/uL (ref 0–200)
Basophils Relative: 1.1 %
Eosinophils Absolute: 38 {cells}/uL (ref 15–500)
Eosinophils Relative: 0.6 %
HCT: 38.6 % (ref 35.0–45.0)
Hemoglobin: 13.2 g/dL (ref 11.7–15.5)
Lymphs Abs: 1512 {cells}/uL (ref 850–3900)
MCH: 31.2 pg (ref 27.0–33.0)
MCHC: 34.2 g/dL (ref 32.0–36.0)
MCV: 91.3 fL (ref 80.0–100.0)
MPV: 10 fL (ref 7.5–12.5)
Monocytes Relative: 8 %
Neutro Abs: 4177 {cells}/uL (ref 1500–7800)
Neutrophils Relative %: 66.3 %
Platelets: 367 10*3/uL (ref 140–400)
RBC: 4.23 10*6/uL (ref 3.80–5.10)
RDW: 12.6 % (ref 11.0–15.0)
Total Lymphocyte: 24 %
WBC: 6.3 10*3/uL (ref 3.8–10.8)

## 2023-04-09 LAB — LIPID PANEL
Cholesterol: 190 mg/dL (ref <200)
HDL: 70 mg/dL (ref >=50)
LDL Cholesterol (Calc): 93 mg/dL
Non-HDL Cholesterol (Calc): 120 mg/dL (ref <130)
Total CHOL/HDL Ratio: 2.7 (calc) (ref <5.0)
Triglycerides: 170 mg/dL — ABNORMAL HIGH (ref <150)

## 2023-04-15 ENCOUNTER — Other Ambulatory Visit: Payer: Self-pay | Admitting: Nurse Practitioner

## 2023-04-22 ENCOUNTER — Ambulatory Visit (INDEPENDENT_AMBULATORY_CARE_PROVIDER_SITE_OTHER): Payer: Medicare Other | Admitting: Adult Health

## 2023-04-22 ENCOUNTER — Encounter: Payer: Self-pay | Admitting: Adult Health

## 2023-04-22 VITALS — BP 138/68 | HR 73 | Temp 97.6°F | Resp 17 | Ht 60.0 in | Wt 98.2 lb

## 2023-04-22 DIAGNOSIS — R630 Anorexia: Secondary | ICD-10-CM | POA: Diagnosis not present

## 2023-04-22 DIAGNOSIS — M810 Age-related osteoporosis without current pathological fracture: Secondary | ICD-10-CM

## 2023-04-22 DIAGNOSIS — F0150 Vascular dementia without behavioral disturbance: Secondary | ICD-10-CM | POA: Diagnosis not present

## 2023-04-22 DIAGNOSIS — E782 Mixed hyperlipidemia: Secondary | ICD-10-CM | POA: Diagnosis not present

## 2023-04-22 DIAGNOSIS — I10 Essential (primary) hypertension: Secondary | ICD-10-CM | POA: Diagnosis not present

## 2023-04-22 NOTE — Progress Notes (Unsigned)
[facility]  Provider:  Kenard Gower DNP  Code Status:  Full Code  Goals of Care:     04/22/2023   11:51 AM  Advanced Directives  Does Patient Have a Medical Advance Directive? Yes  Type of Advance Directive Living will  Does patient want to make changes to medical advance directive? No - Patient declined     Chief Complaint  Patient presents with   Medical Management of Chronic Issues    Patient is being seen for follow up    Immunizations    Patient is due for shingles and covid vaccine     HPI: Patient is a 82 y.o. female seen today for a 2-week follow up of chronic medical issues.    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   Bunion 07/01/2010   Depression 05/05/2007   External hemorrhoids without mention of complication 03/24/1999   GERD (gastroesophageal reflux disease) 2000   Hyperlipidemia 06/29/2006   Memory loss 04/15/2003   Migraine without aura, without mention of intractable migraine without mention of status migrainosus 03/24/1999   Osteoporosis, unspecified 04/15/2004   Tear film insufficiency, 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 Dr.Orr    EYE SURGERY Bilateral 2010   cataract Dr. Hazle Quant   VAGINAL HYSTERECTOMY  1980    Allergies  Allergen Reactions   Aricept [Donepezil] Diarrhea   Vioxx [Rofecoxib] Nausea Only    Pt. Can't remember what reaction was but asks to leave on her profile.     Outpatient Encounter Medications as of 04/22/2023  Medication Sig   aspirin EC 81 MG tablet Take 1 tablet (81 mg total) by mouth daily. Swallow whole.    Cholecalciferol 50 MCG (2000 UT) CAPS Take 1 tablet daily   losartan (COZAAR) 100 MG tablet Take 1 tablet (100 mg total) by mouth daily. DX I10   memantine (NAMENDA) 10 MG tablet Take 1 tablet (10 mg total) by mouth 2 (two) times daily.   rosuvastatin (CRESTOR) 20 MG tablet TAKE 1 TABLET BY MOUTH EVERY DAY   No facility-administered encounter medications on file as of 04/22/2023.    Review of Systems:  Review of Systems  Constitutional:  Negative for appetite change, chills, fatigue and fever.  HENT:  Negative for congestion, hearing loss, rhinorrhea and sore throat.   Eyes: Negative.   Respiratory:  Negative for cough, shortness of breath and wheezing.   Cardiovascular:  Negative for chest pain, palpitations and leg swelling.  Gastrointestinal:  Negative for abdominal pain, constipation, diarrhea, nausea and vomiting.  Genitourinary:  Negative for dysuria.  Musculoskeletal:  Negative for arthralgias, back pain and myalgias.  Skin:  Negative for color change, rash and wound.  Neurological:  Negative for dizziness, weakness and headaches.  Psychiatric/Behavioral:  Negative for behavioral problems. The patient is not nervous/anxious.     Health Maintenance  Topic Date Due   Zoster Vaccines- Shingrix (2 of 2) 11/30/2022   COVID-19 Vaccine (6 - 2023-24 season) 04/03/2023   Medicare Annual Wellness (AWV)  06/30/2023   DTaP/Tdap/Td (3 - Td or Tdap) 11/16/2028  Pneumonia Vaccine 73+ Years old  Completed   INFLUENZA VACCINE  Completed   DEXA SCAN  Completed   HPV VACCINES  Aged Out   Colonoscopy  Discontinued    Physical Exam: Vitals:   04/22/23 1150  Height: 5' (1.524 m)   Body mass index is 19.22 kg/m. Physical Exam Constitutional:      Appearance: Normal appearance.  HENT:     Head: Normocephalic and atraumatic.     Nose: Nose normal.     Mouth/Throat:     Mouth: Mucous membranes are moist.  Eyes:     Conjunctiva/sclera: Conjunctivae normal.  Cardiovascular:     Rate  and Rhythm: Normal rate and regular rhythm.  Pulmonary:     Effort: Pulmonary effort is normal.     Breath sounds: Normal breath sounds.  Abdominal:     General: Bowel sounds are normal.     Palpations: Abdomen is soft.  Musculoskeletal:        General: Normal range of motion.     Cervical back: Normal range of motion.  Skin:    General: Skin is warm and dry.  Neurological:     General: No focal deficit present.     Mental Status: She is alert and oriented to person, place, and time.  Psychiatric:        Mood and Affect: Mood normal.        Behavior: Behavior normal.        Thought Content: Thought content normal.        Judgment: Judgment normal.     Labs reviewed: Basic Metabolic Panel: Recent Labs    07/21/22 1454 04/08/23 1337  NA 138 133*  K 4.6 4.1  CL 101 98  CO2 28 23  GLUCOSE 98 87  BUN 22 22  CREATININE 0.97* 1.00*  CALCIUM 10.1 9.4   Liver Function Tests: Recent Labs    07/21/22 1454 04/08/23 1337  AST 22 18  ALT 16 12  BILITOT 0.2 0.3  PROT 7.0 6.6   No results for input(s): "LIPASE", "AMYLASE" in the last 8760 hours. No results for input(s): "AMMONIA" in the last 8760 hours. CBC: Recent Labs    07/21/22 1454 04/08/23 1337  WBC 6.6 6.3  NEUTROABS 4,627 4,177  HGB 12.9 13.2  HCT 37.4 38.6  MCV 91.9 91.3  PLT 303 367   Lipid Panel: Recent Labs    07/21/22 1454 04/08/23 1337  CHOL 211* 190  HDL 68 70  LDLCALC 111* 93  TRIG 199* 170*  CHOLHDL 3.1 2.7   No results found for: "HGBA1C"  Procedures since last visit: No results found.  Assessment/Plan     Labs/tests ordered:     Next appt:  07/08/2023

## 2023-04-29 ENCOUNTER — Other Ambulatory Visit: Payer: Self-pay | Admitting: Nurse Practitioner

## 2023-04-29 DIAGNOSIS — I10 Essential (primary) hypertension: Secondary | ICD-10-CM

## 2023-05-06 ENCOUNTER — Ambulatory Visit: Payer: Medicare Other | Admitting: Adult Health

## 2023-05-11 DIAGNOSIS — L538 Other specified erythematous conditions: Secondary | ICD-10-CM | POA: Diagnosis not present

## 2023-05-11 DIAGNOSIS — L814 Other melanin hyperpigmentation: Secondary | ICD-10-CM | POA: Diagnosis not present

## 2023-05-11 DIAGNOSIS — L821 Other seborrheic keratosis: Secondary | ICD-10-CM | POA: Diagnosis not present

## 2023-05-11 DIAGNOSIS — D225 Melanocytic nevi of trunk: Secondary | ICD-10-CM | POA: Diagnosis not present

## 2023-05-11 DIAGNOSIS — D492 Neoplasm of unspecified behavior of bone, soft tissue, and skin: Secondary | ICD-10-CM | POA: Diagnosis not present

## 2023-05-12 ENCOUNTER — Other Ambulatory Visit: Payer: Self-pay | Admitting: Nurse Practitioner

## 2023-05-12 DIAGNOSIS — I10 Essential (primary) hypertension: Secondary | ICD-10-CM

## 2023-05-13 ENCOUNTER — Ambulatory Visit: Payer: Medicare Other | Admitting: Nurse Practitioner

## 2023-05-13 ENCOUNTER — Encounter: Payer: Self-pay | Admitting: Nurse Practitioner

## 2023-05-13 VITALS — BP 128/68 | HR 70 | Temp 98.4°F | Ht 60.0 in | Wt 99.4 lb

## 2023-05-13 DIAGNOSIS — R630 Anorexia: Secondary | ICD-10-CM

## 2023-05-13 DIAGNOSIS — F339 Major depressive disorder, recurrent, unspecified: Secondary | ICD-10-CM

## 2023-05-13 DIAGNOSIS — F0150 Vascular dementia without behavioral disturbance: Secondary | ICD-10-CM | POA: Diagnosis not present

## 2023-05-13 NOTE — Patient Instructions (Signed)
To eat 3 meals a day with ensure daily

## 2023-05-13 NOTE — Progress Notes (Signed)
Careteam: [name], NP as PCP - General (Geriatric Medicine) [name], MD as Consulting Physician (Ophthalmology) [name], MD as Consulting Physician (Ophthalmology) [name], MD (Inactive) as Consulting Physician (Gynecology)  PLACE OF SERVICE:  [facility]  Advanced Directive information    Allergies  Allergen Reactions   Aricept [Donepezil] Diarrhea   Vioxx [Rofecoxib] Nausea Only    Pt. Can't remember what reaction was but asks to leave on her profile.     Chief Complaint  Patient presents with   Medical Management of Chronic Issues    Patient presents today for 2 weeks follow-up on poor appetite.   Quality Metric Gaps    AWV, zoster, COVID#6     HPI: Patient is a 82 y.o. female for follow up on appetite.  She is here alone today.  She feels like her appetite has been fine.  Reports her son brings her food and she eats what he brings her.  She reports she is eating 3 meals a day.  Reports she has good energy.  Weight has been stable over the last 2 weeks.  Drinking 1 ensure a day  Reports she is fearful because she does not have anyone to go dancing with her so she does not want to go.  She does not drive at night so had to quit dancing.   She makes the comment that she feels like she has a bulge in her abdomen and "wants to get it off"  Review of Systems:  Review of Systems  Constitutional:  Negative for chills, fever and weight loss.  HENT:  Negative for tinnitus.   Respiratory:  Negative for cough, sputum production and shortness of breath.   Cardiovascular:  Negative for chest pain, palpitations and leg swelling.  Gastrointestinal:  Negative for abdominal pain, constipation, diarrhea and heartburn.  Genitourinary:  Negative for dysuria, frequency and urgency.  Musculoskeletal:  Negative for back pain, falls, joint pain and myalgias.  Skin: Negative.   Neurological:  Negative for dizziness and headaches.   Psychiatric/Behavioral:  Positive for memory loss. Negative for depression. The patient is nervous/anxious. The patient does not have insomnia.     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   Bunion 07/01/2010   Depression 05/05/2007   External hemorrhoids without mention of complication 03/24/1999   GERD (gastroesophageal reflux disease) 2000   Hyperlipidemia 06/29/2006   Memory loss 04/15/2003   Migraine without aura, without mention of intractable migraine without mention of status migrainosus 03/24/1999   Osteoporosis, unspecified 04/15/2004   Tear film insufficiency, unspecified 03/24/1999   Unspecified essential hypertension 11/22/2007   Past Surgical History:  Procedure Laterality Date   COLONOSCOPY  06-16-2007   Internal hemorrhoids,laxity of anal sphincter,polyp at 25cm form anal verge, tortuous sigmoid colon. Dr.Orr    EYE SURGERY Bilateral 2010   cataract Dr. Hazle Quant   VAGINAL HYSTERECTOMY  1980   Social History:   reports that she has never smoked. She has never used smokeless tobacco. She reports that she does not drink alcohol and does not use drugs.  Family History  Problem Relation Age of Onset   Alzheimer's disease Mother    Diabetes Mother    Transient ischemic attack Mother    Heart disease Mother        MI, CHF   Alcohol abuse Father    Kidney disease Father    Hypertension Sister  Hypertension Brother    Heart disease Brother    Hypertension Brother    Hypertension Sister    Hypertension Brother    Other Son        truck accident 07/2016   Colon cancer Neg Hx    Stomach cancer Neg Hx     Medications: Patient's Medications  New Prescriptions   No medications on file  Previous Medications   ASPIRIN EC 81 MG TABLET    Take 1 tablet (81 mg total) by mouth daily. Swallow whole.   CHOLECALCIFEROL 50 MCG (2000 UT) CAPS    Take 1 tablet daily   LOSARTAN (COZAAR) 100 MG TABLET    Take 1 tablet (100 mg  total) by mouth daily. Essential hypertension I10   MEMANTINE (NAMENDA) 10 MG TABLET    Take 1 tablet (10 mg total) by mouth 2 (two) times daily.   ROSUVASTATIN (CRESTOR) 20 MG TABLET    TAKE 1 TABLET BY MOUTH EVERY DAY  Modified Medications   No medications on file  Discontinued Medications   No medications on file    Physical Exam:  Vitals:   05/13/23 1423  BP: 128/68  Pulse: 70  Temp: 98.4 F (36.9 C)  SpO2: 98%  Weight: 99 lb 6.4 oz (45.1 kg)  Height: 5' (1.524 m)   Body mass index is 19.41 kg/m. Wt Readings from Last 3 Encounters:  05/13/23 99 lb 6.4 oz (45.1 kg)  04/22/23 98 lb 3.2 oz (44.5 kg)  04/08/23 98 lb 6.4 oz (44.6 kg)    Physical Exam Constitutional:      General: She is not in acute distress.    Appearance: She is well-developed. She is not diaphoretic.  HENT:     Head: Normocephalic and atraumatic.     Mouth/Throat:     Pharynx: No oropharyngeal exudate.  Eyes:     Conjunctiva/sclera: Conjunctivae normal.     Pupils: Pupils are equal, round, and reactive to light.  Cardiovascular:     Rate and Rhythm: Normal rate and regular rhythm.     Heart sounds: Normal heart sounds.  Pulmonary:     Effort: Pulmonary effort is normal.     Breath sounds: Normal breath sounds.  Abdominal:     General: Bowel sounds are normal.     Palpations: Abdomen is soft.  Musculoskeletal:     Cervical back: Normal range of motion and neck supple.     Right lower leg: No edema.     Left lower leg: No edema.  Skin:    General: Skin is warm and dry.  Neurological:     Mental Status: She is alert.     Comments: Alert and oriented but has a hard time finding words and repeats herself often  Psychiatric:        Mood and Affect: Mood normal.     Labs reviewed: Basic Metabolic Panel: Recent Labs    07/21/22 1454 04/08/23 1337  NA 138 133*  K 4.6 4.1  CL 101 98  CO2 28 23  GLUCOSE 98 87  BUN 22 22  CREATININE 0.97* 1.00*  CALCIUM 10.1 9.4   Liver Function  Tests: Recent Labs    07/21/22 1454 04/08/23 1337  AST 22 18  ALT 16 12  BILITOT 0.2 0.3  PROT 7.0 6.6   No results for input(s): "LIPASE", "AMYLASE" in the last 8760 hours. No results for input(s): "AMMONIA" in the last 8760 hours. CBC: Recent Labs    07/21/22 1454 04/08/23  1337  WBC 6.6 6.3  NEUTROABS 4,627 4,177  HGB 12.9 13.2  HCT 37.4 38.6  MCV 91.9 91.3  PLT 303 367   Lipid Panel: Recent Labs    07/21/22 1454 04/08/23 1337  CHOL 211* 190  HDL 68 70  LDLCALC 111* 93  TRIG 199* 170*  CHOLHDL 3.1 2.7   TSH: No results for input(s): "TSH" in the last 8760 hours. A1C: No results found for: "HGBA1C"   Assessment/Plan 1. Poor appetite -reports appetite is good, she states she eats routinely and has been using supplement daily Encouraged 3 meals a day with ongoing supplement daily -discussed need for proper nutrition and not to try to lose weight.   2. Vascular dementia without behavioral disturbance (HCC) -ongoing, weight loss expected with progression of disease. Will continue to monitor.   3. Depression, recurrent (HCC) States mood is okay, no increase in anxiety or depression at this time. Will monitor   To keep follow ups as scheduled  Arleta Ostrum K. Biagio Borg [facility] [phone]

## 2023-07-08 ENCOUNTER — Ambulatory Visit: Payer: Medicare Other | Admitting: Nurse Practitioner

## 2023-07-18 ENCOUNTER — Encounter: Payer: Medicaid Other | Admitting: Nurse Practitioner

## 2023-08-03 DIAGNOSIS — C240 Malignant neoplasm of extrahepatic bile duct: Secondary | ICD-10-CM

## 2023-08-03 HISTORY — DX: Malignant neoplasm of extrahepatic bile duct: C24.0

## 2023-09-06 ENCOUNTER — Ambulatory Visit: Payer: Medicare Other | Admitting: Family

## 2023-09-06 ENCOUNTER — Encounter: Payer: Self-pay | Admitting: Family

## 2023-09-06 VITALS — BP 130/72 | HR 75 | Temp 97.8°F | Resp 17 | Ht 60.0 in | Wt 99.2 lb

## 2023-09-06 DIAGNOSIS — F0150 Vascular dementia without behavioral disturbance: Secondary | ICD-10-CM

## 2023-09-06 DIAGNOSIS — R634 Abnormal weight loss: Secondary | ICD-10-CM

## 2023-09-06 DIAGNOSIS — E782 Mixed hyperlipidemia: Secondary | ICD-10-CM | POA: Diagnosis not present

## 2023-09-06 MED ORDER — MEMANTINE HCL ER 7 MG PO CP24: ORAL_CAPSULE | ORAL | 3 refills | Status: DC

## 2023-09-06 MED ORDER — ENSURE ACTIVE HIGH PROTEIN PO LIQD
1.0000 | Freq: Every day | ORAL | 3 refills | Status: DC
Start: 2023-09-06 — End: 2023-12-29

## 2023-09-06 MED ORDER — MEMANTINE HCL 10 MG PO TABS
10.0000 mg | ORAL_TABLET | Freq: Two times a day (BID) | ORAL | 12 refills | Status: DC
Start: 2023-09-06 — End: 2023-09-06

## 2023-09-06 NOTE — Progress Notes (Signed)
 Provider: Alphia Behanna FNP-C  [name], NP  Patient Care Team: [name], NP as PCP - General (Geriatric Medicine) Cleatus Collar, MD as Consulting Physician (Ophthalmology) Camillo Golas, MD as Consulting Physician (Ophthalmology) Winfred Curlee DEL, MD (Inactive) as Consulting Physician (Gynecology)  Extended Emergency Contact Information Primary Emergency Contact: McLamb,Randall  [place] Home Phone: [phone] Relation: Son Secondary Emergency Contact: [facility] Home Phone: [phone] Relation: Other  Code Status:  Full Code  Goals of care: Advanced Directive information    09/06/2023    2:52 PM  Advanced Directives  Does Patient Have a Medical Advance Directive? Yes     Chief Complaint  Patient presents with   Acute Visit    Patient is being seen for acute visit memory    Discussed the use of AI scribe software for clinical note transcription with the patient, who gave verbal consent to proceed.  History of Present Illness   Caroline Snyder is an 83 year old female who presents with worsening memory issues. She is accompanied by her family, including her daughter.  She is experiencing worsening memory issues, often forgetting information shortly after being told, such as not remembering details an hour later. She tends to get distracted while speaking. Her family reports a gradual progression of these memory issues, and she was previously evaluated by a neurologist in 2024. She lives alone but receives assistance with meals from her family. She is able to perform some daily activities independently, such as making coffee, but struggles with remembering appointments and social engagements.  She has tried donepezil  in the past, which caused an upset stomach, and was prescribed Namenda  (memantine ) 10 mg, which she did not pick up. She currently does not take any memory medication. Her current medications include  rosuvastatin  for cholesterol, which she sometimes forgets to take. She uses a pill box to organize her medications. She sometimes forgets to take her blood pressure medication.  She continues to drive short distances, primarily to nearby locations, but her family is concerned about her safety due to her memory issues. She has not experienced any wandering episodes.  She has maintained a stable weight between 97 to 99 pounds, eating about two meals a day with snacks. Her family encourages her to consume protein supplements like Ensure, but she has not been consistent with this.  She reports sleeping well at night, although she wakes up a couple of times to use the bathroom. She engages in activities like dancing and occasionally does crossword puzzles.        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   Bunion 07/01/2010   Depression 05/05/2007   External hemorrhoids without mention of complication 03/24/1999   GERD (gastroesophageal reflux disease) 2000   Hyperlipidemia 06/29/2006   Memory loss 04/15/2003   Migraine without aura, without mention of intractable migraine without mention of status migrainosus 03/24/1999   Osteoporosis, unspecified 04/15/2004   Tear film insufficiency, unspecified 03/24/1999   Unspecified essential hypertension 11/22/2007   Past Surgical History:  Procedure Laterality Date   COLONOSCOPY  06-16-2007   Internal hemorrhoids,laxity of anal sphincter,polyp at 25cm form anal verge, tortuous sigmoid colon. Dr.Orr    EYE SURGERY Bilateral 2010   cataract Dr. Camillo   VAGINAL HYSTERECTOMY  1980    Allergies  Allergen Reactions   Aricept  [Donepezil ] Diarrhea   Vioxx [Rofecoxib] Nausea Only    Pt. Can't remember what reaction  was but asks to leave on her profile.     Outpatient Encounter Medications as of 09/06/2023  Medication Sig   aspirin  EC 81 MG tablet Take 1 tablet (81 mg total) by mouth daily. Swallow  whole.   Cholecalciferol  50 MCG (2000 UT) CAPS Take 1 tablet daily   losartan  (COZAAR ) 100 MG tablet Take 1 tablet (100 mg total) by mouth daily. Essential hypertension I10   rosuvastatin  (CRESTOR ) 20 MG tablet TAKE 1 TABLET BY MOUTH EVERY DAY   memantine  (NAMENDA ) 10 MG tablet Take 1 tablet (10 mg total) by mouth 2 (two) times daily.   No facility-administered encounter medications on file as of 09/06/2023.    Review of Systems  Constitutional:  Negative for appetite change, chills, fatigue, fever and unexpected weight change.  HENT:  Negative for congestion, dental problem, ear discharge, ear pain, facial swelling, hearing loss, nosebleeds, postnasal drip, rhinorrhea, sinus pressure, sinus pain, sneezing, sore throat, tinnitus and trouble swallowing.   Eyes:  Negative for pain, discharge, redness, itching and visual disturbance.  Respiratory:  Negative for cough, chest tightness, shortness of breath and wheezing.   Cardiovascular:  Negative for chest pain, palpitations and leg swelling.  Gastrointestinal:  Negative for abdominal distention, abdominal pain, blood in stool, constipation, diarrhea, nausea and vomiting.  Endocrine: Negative for cold intolerance, heat intolerance, polydipsia, polyphagia and polyuria.  Genitourinary:  Negative for difficulty urinating, dysuria, flank pain, frequency and urgency.  Musculoskeletal:  Negative for arthralgias, back pain, gait problem, joint swelling, myalgias, neck pain and neck stiffness.  Skin:  Negative for color change, pallor, rash and wound.  Neurological:  Negative for dizziness, syncope, speech difficulty, weakness, light-headedness, numbness and headaches.  Hematological:  Does not bruise/bleed easily.  Psychiatric/Behavioral:  Negative for agitation, behavioral problems, confusion, hallucinations, self-injury, sleep disturbance and suicidal ideas. The patient is not nervous/anxious.        Worsening memory loss     Immunization History   Administered Date(s) Administered   Fluad Quad(high Dose 65+) 04/26/2019, 04/21/2020   Influenza, High Dose Seasonal PF 03/11/2017, 06/09/2018, 03/19/2023   Influenza,inj,Quad PF,6+ Mos 07/19/2013, 05/28/2016, 06/02/2017   Influenza,inj,quad, With Preservative 06/08/2021   Influenza-Unspecified 05/23/2015   Moderna Covid-19 Fall Seasonal Vaccine 4yrs & older 06/29/2022   PFIZER(Purple Top)SARS-COV-2 Vaccination 08/22/2019, 09/12/2019, 05/29/2020   PNEUMOCOCCAL CONJUGATE-20 09/16/2022   PPD Test 11/30/2013, 05/30/2015   Pfizer Covid-19 Vaccine Bivalent Booster 65yrs & up 06/08/2021   Pneumococcal Conjugate-13 12/24/2014, 05/28/2016   Pneumococcal Polysaccharide-23 06/09/2018   Tdap 03/11/2017, 11/17/2018   Zoster Recombinant(Shingrix) 10/05/2022   Zoster, Live 11/19/2009   Pertinent  Health Maintenance Due  Topic Date Due   INFLUENZA VACCINE  Completed   DEXA SCAN  Completed   Colonoscopy  Discontinued      07/21/2022    2:12 PM 07/30/2022   10:26 AM 10/01/2022    2:33 PM 04/08/2023    1:05 PM 04/22/2023   11:51 AM  Fall Risk  Falls in the past year? 0 0 0 0 0  Was there an injury with Fall? 0 0 0 0 0  Fall Risk Category Calculator 0 0 0 0 0  Fall Risk Category (Retired) Low Low     (RETIRED) Patient Fall Risk Level Low fall risk Low fall risk     Patient at Risk for Falls Due to No Fall Risks No Fall Risks No Fall Risks No Fall Risks No Fall Risks  Fall risk Follow up Falls evaluation completed Falls evaluation completed Falls evaluation completed  Falls evaluation completed Falls evaluation completed   Functional Status Survey:    Vitals:   09/06/23 1454  BP: 130/72  Pulse: 75  Resp: 17  Temp: 97.8 F (36.6 C)  SpO2: 95%  Weight: 99 lb 3.2 oz (45 kg)   Body mass index is 19.37 kg/m. Physical Exam Vitals reviewed.  Constitutional:      General: She is not in acute distress.    Appearance: Normal appearance. She is normal weight. She is not ill-appearing or  diaphoretic.  HENT:     Head: Normocephalic.     Right Ear: Tympanic membrane, ear canal and external ear normal. There is no impacted cerumen.     Left Ear: Tympanic membrane, ear canal and external ear normal. There is no impacted cerumen.     Nose: Nose normal. No congestion or rhinorrhea.     Mouth/Throat:     Mouth: Mucous membranes are moist.     Pharynx: Oropharynx is clear. No oropharyngeal exudate or posterior oropharyngeal erythema.  Eyes:     General: No scleral icterus.       Right eye: No discharge.        Left eye: No discharge.     Extraocular Movements: Extraocular movements intact.     Conjunctiva/sclera: Conjunctivae normal.     Pupils: Pupils are equal, round, and reactive to light.  Neck:     Vascular: No carotid bruit.  Cardiovascular:     Rate and Rhythm: Normal rate and regular rhythm.     Pulses: Normal pulses.     Heart sounds: Normal heart sounds. No murmur heard.    No friction rub. No gallop.  Pulmonary:     Effort: Pulmonary effort is normal. No respiratory distress.     Breath sounds: Normal breath sounds. No wheezing, rhonchi or rales.  Chest:     Chest wall: No tenderness.  Abdominal:     General: Bowel sounds are normal. There is no distension.     Palpations: Abdomen is soft. There is no mass.     Tenderness: There is no abdominal tenderness. There is no right CVA tenderness, left CVA tenderness, guarding or rebound.  Musculoskeletal:        General: No swelling or tenderness. Normal range of motion.     Cervical back: Normal range of motion. No rigidity or tenderness.     Right lower leg: No edema.     Left lower leg: No edema.  Lymphadenopathy:     Cervical: No cervical adenopathy.  Skin:    General: Skin is warm and dry.     Coloration: Skin is not pale.     Findings: No bruising, erythema, lesion or rash.  Neurological:     Mental Status: She is alert and oriented to person, place, and time.     Cranial Nerves: No cranial nerve  deficit.     Sensory: No sensory deficit.     Motor: No weakness.     Coordination: Coordination normal.     Gait: Gait normal.  Psychiatric:        Mood and Affect: Mood normal.        Speech: Speech normal.        Behavior: Behavior normal.        Thought Content: Thought content normal.        Cognition and Memory: She exhibits impaired recent memory.        Judgment: Judgment normal.     Labs reviewed: Recent Labs  04/08/23 1337  NA 133*  K 4.1  CL 98  CO2 23  GLUCOSE 87  BUN 22  CREATININE 1.00*  CALCIUM  9.4   Recent Labs    04/08/23 1337  AST 18  ALT 12  BILITOT 0.3  PROT 6.6   Recent Labs    04/08/23 1337  WBC 6.3  NEUTROABS 4,177  HGB 13.2  HCT 38.6  MCV 91.3  PLT 367   Lab Results  Component Value Date   TSH 3.71 04/21/2020   No results found for: HGBA1C Lab Results  Component Value Date   CHOL 190 04/08/2023   HDL 70 04/08/2023   LDLCALC 93 04/08/2023   TRIG 170 (H) 04/08/2023   CHOLHDL 2.7 04/08/2023    Significant Diagnostic Results in last 30 days:  No results found.  Assessment/Plan  Memory Loss   Worsening memory issues, scoring 19/30 on cognitive test, indicating mild cognitive impairment. Previous donepezil  caused gastrointestinal upset; did not pick up Namenda  (memantine ). Neurologist recommended Namenda . Discussed limited memory medications and her role in preventing further cognitive decline. Emphasized adherence to medication.   - Prescribe Namenda  XR 7 mg once daily titrate to XR 28 mg tablet daily    - Follow up in one month to assess efficacy and side effects    Nutritional Deficiency   Consumes two meals daily with occasional snacks; inconsistent with Ensure. Weight stable at 97-99 lbs. Discussed importance of nutrition and protein supplements in cognitive health. Recommended multivitamin and balanced diet.   - Encourage Ensure, one bottle daily in two servings   - Recommend multivitamin supplementation   - Advise  balanced diet with three meals daily    Hyperlipidemia   On rosuvastatin . Occasionally forgets medication but uses pill box. Blood pressure well-controlled at 130/72 mmHg.   - Continue rosuvastatin  as prescribed   - Encourage consistent use of pill box for adherence    General Health Maintenance   Discussed staying active and engaging in brain-stimulating activities. Recommended reminders and tools for daily tasks and memory. Suggested clock with date/time display and keeping a journal.   - Encourage physical activity like dancing   - Recommend brain-stimulating activities (crosswords, puzzles)   - Suggest clock with date/time display for orientation   - Advise keeping a journal for daily activities and medication intake    Follow-up   - Follow up in one month to assess response to Namenda    - Routine visit with primary care provider in April   - Ensure Prolia  injection appointment next month.   Family/ staff Communication: Reviewed plan of care with patient verbalized understanding   Labs/tests ordered: None   Next Appointment: Return in about 1 month (around 10/04/2023) for f/u dementia .   Total time: 28 minutes. Greater than 50% of total time spent doing patient education regarding memory loss,Weight loss ,Hyperlipidemia and health maintenance including symptom/medication management.   Roxan JAYSON Plough, NP

## 2023-09-07 ENCOUNTER — Ambulatory Visit: Payer: Medicare Other | Admitting: Family

## 2023-09-12 ENCOUNTER — Observation Stay (HOSPITAL_COMMUNITY)
Admission: EM | Admit: 2023-09-12 | Discharge: 2023-09-13 | Disposition: A | Discharge status: Home / Self Care | Payer: Medicare Other | Attending: Emergency Medicine | Admitting: Emergency Medicine

## 2023-09-12 ENCOUNTER — Emergency Department (HOSPITAL_COMMUNITY): Payer: Medicare Other

## 2023-09-12 ENCOUNTER — Encounter (HOSPITAL_COMMUNITY): Payer: Self-pay

## 2023-09-12 ENCOUNTER — Other Ambulatory Visit: Payer: Self-pay

## 2023-09-12 DIAGNOSIS — G9340 Encephalopathy, unspecified: Secondary | ICD-10-CM | POA: Diagnosis not present

## 2023-09-12 DIAGNOSIS — I672 Cerebral atherosclerosis: Secondary | ICD-10-CM | POA: Diagnosis not present

## 2023-09-12 DIAGNOSIS — I10 Essential (primary) hypertension: Secondary | ICD-10-CM | POA: Diagnosis not present

## 2023-09-12 DIAGNOSIS — Z7982 Long term (current) use of aspirin: Secondary | ICD-10-CM | POA: Insufficient documentation

## 2023-09-12 DIAGNOSIS — F0390 Unspecified dementia without behavioral disturbance: Secondary | ICD-10-CM | POA: Diagnosis not present

## 2023-09-12 DIAGNOSIS — M810 Age-related osteoporosis without current pathological fracture: Secondary | ICD-10-CM | POA: Diagnosis present

## 2023-09-12 DIAGNOSIS — Z79899 Other long term (current) drug therapy: Secondary | ICD-10-CM | POA: Insufficient documentation

## 2023-09-12 DIAGNOSIS — I6782 Cerebral ischemia: Secondary | ICD-10-CM | POA: Diagnosis not present

## 2023-09-12 DIAGNOSIS — R531 Weakness: Secondary | ICD-10-CM | POA: Diagnosis not present

## 2023-09-12 DIAGNOSIS — R29818 Other symptoms and signs involving the nervous system: Secondary | ICD-10-CM | POA: Diagnosis not present

## 2023-09-12 DIAGNOSIS — I639 Cerebral infarction, unspecified: Secondary | ICD-10-CM | POA: Diagnosis not present

## 2023-09-12 DIAGNOSIS — R4182 Altered mental status, unspecified: Principal | ICD-10-CM

## 2023-09-12 DIAGNOSIS — I161 Hypertensive emergency: Principal | ICD-10-CM

## 2023-09-12 DIAGNOSIS — G459 Transient cerebral ischemic attack, unspecified: Secondary | ICD-10-CM | POA: Diagnosis not present

## 2023-09-12 DIAGNOSIS — E785 Hyperlipidemia, unspecified: Secondary | ICD-10-CM | POA: Diagnosis not present

## 2023-09-12 DIAGNOSIS — R2681 Unsteadiness on feet: Secondary | ICD-10-CM | POA: Diagnosis not present

## 2023-09-12 DIAGNOSIS — I6623 Occlusion and stenosis of bilateral posterior cerebral arteries: Secondary | ICD-10-CM | POA: Diagnosis not present

## 2023-09-12 DIAGNOSIS — I6523 Occlusion and stenosis of bilateral carotid arteries: Secondary | ICD-10-CM | POA: Diagnosis not present

## 2023-09-12 DIAGNOSIS — R410 Disorientation, unspecified: Secondary | ICD-10-CM | POA: Diagnosis not present

## 2023-09-12 LAB — CBC
HCT: 38.2 % (ref 36.0–46.0)
Hemoglobin: 12.9 g/dL (ref 12.0–15.0)
MCH: 30.4 pg (ref 26.0–34.0)
MCHC: 33.8 g/dL (ref 30.0–36.0)
MCV: 90.1 fL (ref 80.0–100.0)
Platelets: 292 10*3/uL (ref 150–400)
RBC: 4.24 MIL/uL (ref 3.87–5.11)
RDW: 11.9 % (ref 11.5–15.5)
WBC: 6.9 10*3/uL (ref 4.0–10.5)
nRBC: 0 % (ref 0.0–0.2)

## 2023-09-12 LAB — URINALYSIS, ROUTINE W REFLEX MICROSCOPIC
Bilirubin Urine: NEGATIVE
Glucose, UA: NEGATIVE mg/dL
Hgb urine dipstick: NEGATIVE
Ketones, ur: 5 mg/dL — AB
Leukocytes,Ua: NEGATIVE
Nitrite: NEGATIVE
Protein, ur: NEGATIVE mg/dL
Specific Gravity, Urine: 1.042 — ABNORMAL HIGH (ref 1.005–1.030)
pH: 7 (ref 5.0–8.0)

## 2023-09-12 LAB — COMPREHENSIVE METABOLIC PANEL
ALT: 14 U/L (ref 0–44)
AST: 21 U/L (ref 15–41)
Albumin: 3.9 g/dL (ref 3.5–5.0)
Alkaline Phosphatase: 34 U/L — ABNORMAL LOW (ref 38–126)
Anion gap: 14 (ref 5–15)
BUN: 19 mg/dL (ref 8–23)
CO2: 23 mmol/L (ref 22–32)
Calcium: 9.4 mg/dL (ref 8.9–10.3)
Chloride: 100 mmol/L (ref 98–111)
Creatinine, Ser: 0.93 mg/dL (ref 0.44–1.00)
GFR, Estimated: >60 mL/min (ref >60)
Glucose, Bld: 106 mg/dL — ABNORMAL HIGH (ref 70–99)
Potassium: 3.8 mmol/L (ref 3.5–5.1)
Sodium: 137 mmol/L (ref 135–145)
Total Bilirubin: 0.5 mg/dL (ref 0.0–1.2)
Total Protein: 6.9 g/dL (ref 6.5–8.1)

## 2023-09-12 LAB — DIFFERENTIAL
Abs Immature Granulocytes: 0.02 10*3/uL (ref 0.00–0.07)
Basophils Absolute: 0.1 10*3/uL (ref 0.0–0.1)
Basophils Relative: 1 %
Eosinophils Absolute: 0.1 10*3/uL (ref 0.0–0.5)
Eosinophils Relative: 1 %
Immature Granulocytes: 0 %
Lymphocytes Relative: 22 %
Lymphs Abs: 1.5 10*3/uL (ref 0.7–4.0)
Monocytes Absolute: 0.6 10*3/uL (ref 0.1–1.0)
Monocytes Relative: 8 %
Neutro Abs: 4.7 10*3/uL (ref 1.7–7.7)
Neutrophils Relative %: 68 %

## 2023-09-12 LAB — PROTIME-INR
INR: 1 (ref 0.8–1.2)
Prothrombin Time: 13.2 s (ref 11.4–15.2)

## 2023-09-12 LAB — RAPID URINE DRUG SCREEN, HOSP PERFORMED
Amphetamines: NOT DETECTED
Barbiturates: NOT DETECTED
Benzodiazepines: NOT DETECTED
Cocaine: NOT DETECTED
Opiates: NOT DETECTED
Tetrahydrocannabinol: NOT DETECTED

## 2023-09-12 LAB — I-STAT CHEM 8, ED
BUN: 16 mg/dL (ref 8–23)
Calcium, Ion: 1.05 mmol/L — ABNORMAL LOW (ref 1.15–1.40)
Chloride: 104 mmol/L (ref 98–111)
Creatinine, Ser: 0.7 mg/dL (ref 0.44–1.00)
Glucose, Bld: 89 mg/dL (ref 70–99)
HCT: 33 % — ABNORMAL LOW (ref 36.0–46.0)
Hemoglobin: 11.2 g/dL — ABNORMAL LOW (ref 12.0–15.0)
Potassium: 3.1 mmol/L — ABNORMAL LOW (ref 3.5–5.1)
Sodium: 139 mmol/L (ref 135–145)
TCO2: 21 mmol/L — ABNORMAL LOW (ref 22–32)

## 2023-09-12 LAB — APTT: aPTT: 28 s (ref 24–36)

## 2023-09-12 LAB — ETHANOL: Alcohol, Ethyl (B): <10 mg/dL (ref <10)

## 2023-09-12 LAB — CBG MONITORING, ED: Glucose-Capillary: 107 mg/dL — ABNORMAL HIGH (ref 70–99)

## 2023-09-12 MED ORDER — ACETAMINOPHEN 325 MG PO TABS: 650.0000 mg | ORAL_TABLET | Freq: Four times a day (QID) | ORAL | Status: DC | PRN

## 2023-09-12 MED ORDER — ONDANSETRON HCL 4 MG/2ML IJ SOLN
4.0000 mg | Freq: Four times a day (QID) | INTRAMUSCULAR | Status: DC | PRN
  Administered 2023-09-13: 4 mg via INTRAVENOUS
  Filled 2023-09-12: qty 2

## 2023-09-12 MED ORDER — ACETAMINOPHEN 650 MG RE SUPP: 650.0000 mg | Freq: Four times a day (QID) | RECTAL | Status: DC | PRN

## 2023-09-12 MED ORDER — ONDANSETRON HCL 4 MG PO TABS: 4.0000 mg | ORAL_TABLET | Freq: Four times a day (QID) | ORAL | Status: DC | PRN

## 2023-09-12 MED ORDER — SENNOSIDES-DOCUSATE SODIUM 8.6-50 MG PO TABS: 1.0000 | ORAL_TABLET | Freq: Every evening | ORAL | Status: DC | PRN

## 2023-09-12 MED ORDER — IOHEXOL 350 MG/ML SOLN
100.0000 mL | Freq: Once | INTRAVENOUS | Status: AC | PRN
  Administered 2023-09-12: 100 mL via INTRAVENOUS

## 2023-09-12 MED ORDER — ENOXAPARIN SODIUM 30 MG/0.3ML IJ SOSY
30.0000 mg | PREFILLED_SYRINGE | Freq: Every day | INTRAMUSCULAR | Status: DC
  Filled 2023-09-12: qty 0.3

## 2023-09-12 NOTE — ED Triage Notes (Signed)
 Pt BIB [facility] from home after family was concerned for stroke like symptoms. LKN 1330. At 1900, pt was "slumped to side, couldn't walk, mumbling, and confused".  18g LAC 159/88 67HR

## 2023-09-12 NOTE — ED Notes (Signed)
 Patient transported to MRI

## 2023-09-12 NOTE — ED Provider Notes (Signed)
[place] EMERGENCY DEPARTMENT AT [facility] Provider Note   CSN: [id number] Arrival date & time: 09/12/23  2105  An emergency department physician performed an initial assessment on this suspected stroke patient at 2107.  History  Chief Complaint  Patient presents with   Code Stroke    Caroline Snyder is a 83 y.o. female.  HPI Patient arrives via EMS for code stroke. EMS was called out to house by family for patient with slurred speech and acting altered. On EMS arrival, patient's blood pressures were systolics greater than 200s.  Initially patient/family did not want to proceed to emergency department.  EMS elected to give patient her home antihypertensive medications, losartan and HCTZ.  Patient's blood pressure did slowly improve and eventually normalized. Her mental status also gradually began to improve and by the time she arrived to the emergency department, she did not have focal deficits.     Home Medications Prior to Admission medications   Medication Sig Start Date End Date Taking? Authorizing Provider  aspirin EC 81 MG tablet Take 1 tablet (81 mg total) by mouth daily. Swallow whole. 10/31/20   Sharon Seller, NP  Cholecalciferol 50 MCG (2000 UT) CAPS Take 1 tablet daily    [provider]  losartan (COZAAR) 100 MG tablet Take 1 tablet (100 mg total) by mouth daily. Essential hypertension I10 04/29/23   Sharon Seller, NP  memantine (NAMENDA XR) 7 MG CP24 24 hr capsule Take ER 7 mg capsule one by mouth daily x 1 week then  Take ER 14 mg capsule one by mouth daily x 1 week then increase to  ER 28 mg capsule one by mouth daily 09/06/23   [name], Dinah C, NP  Nutritional Supplements (ENSURE ACTIVE HIGH PROTEIN) LIQD Take 1 Bottle by mouth daily. 09/06/23   [name], Dinah C, NP  rosuvastatin (CRESTOR) 20 MG tablet TAKE 1 TABLET BY MOUTH EVERY DAY 04/15/23   Sharon Seller, NP      Allergies    Aricept [donepezil] and Vioxx [rofecoxib]     Review of Systems   Review of Systems  Physical Exam Updated Vital Signs BP (!) 172/91   Pulse 67   Temp 98 F (36.7 C)   Resp 17   Ht 5' (1.524 m)   Wt 46 kg   SpO2 97%   BMI 19.81 kg/m  Physical Exam Vitals and nursing note reviewed.  Constitutional:      Appearance: She is well-developed.  HENT:     Head: Normocephalic and atraumatic.  Eyes:     Conjunctiva/sclera: Conjunctivae normal.  Cardiovascular:     Rate and Rhythm: Normal rate and regular rhythm.     Heart sounds: No murmur heard. Pulmonary:     Effort: Pulmonary effort is normal. No respiratory distress.     Breath sounds: Normal breath sounds.  Abdominal:     Palpations: Abdomen is soft.     Tenderness: There is no abdominal tenderness.  Musculoskeletal:        General: No swelling.     Cervical back: Neck supple.  Skin:    General: Skin is warm and dry.     Capillary Refill: Capillary refill takes less than 2 seconds.  Neurological:     Mental Status: She is alert.     Comments: Oriented x3, speech slowed. Unable to spell WORLD backwards     ED Results / Procedures / Treatments   Labs (all labs ordered are listed, but only  abnormal results are displayed) Labs Reviewed  COMPREHENSIVE METABOLIC PANEL - Abnormal; Notable for the following components:      Result Value   Glucose, Bld 106 (*)    Alkaline Phosphatase 34 (*)    All other components within normal limits  URINALYSIS, ROUTINE W REFLEX MICROSCOPIC - Abnormal; Notable for the following components:   Color, Urine STRAW (*)    Specific Gravity, Urine 1.042 (*)    Ketones, ur 5 (*)    All other components within normal limits  I-STAT CHEM 8, ED - Abnormal; Notable for the following components:   Potassium 3.1 (*)    Calcium, Ion 1.05 (*)    TCO2 21 (*)    Hemoglobin 11.2 (*)    HCT 33.0 (*)    All other components within normal limits  CBG MONITORING, ED - Abnormal; Notable for the following components:   Glucose-Capillary 107 (*)     All other components within normal limits  ETHANOL  PROTIME-INR  APTT  CBC  DIFFERENTIAL  RAPID URINE DRUG SCREEN, HOSP PERFORMED    EKG None  Radiology CT ANGIO HEAD NECK W WO CM W PERF (CODE STROKE) Result Date: 09/12/2023 CLINICAL DATA:  Neuro deficit, acute, stroke suspected. EXAM: CT ANGIOGRAPHY HEAD AND NECK CT PERFUSION BRAIN TECHNIQUE: Multidetector CT imaging of the head and neck was performed using the standard protocol during bolus administration of intravenous contrast. Multiplanar CT image reconstructions and MIPs were obtained to evaluate the vascular anatomy. Carotid stenosis measurements (when applicable) are obtained utilizing NASCET criteria, using the distal internal carotid diameter as the denominator. Multiphase CT imaging of the brain was performed following IV bolus contrast injection. Subsequent parametric perfusion maps were calculated using RAPID software. RADIATION DOSE REDUCTION: This exam was performed according to the departmental dose-optimization program which includes automated exposure control, adjustment of the mA and/or kV according to patient size and/or use of iterative reconstruction technique. CONTRAST:  OMNIPAQUE IOHEXOL 350 MG/ML SOLN COMPARISON:  Head CT same day FINDINGS: CTA NECK FINDINGS Aortic arch: Normal.  No atherosclerotic change. Right carotid system: Soft plaque along the course of the common carotid artery but without stenosis greater than 20%. At the bifurcation, there is calcified plaque but no stenosis. Cervical ICA widely patent. Left carotid system: Common carotid artery shows mild soft and calcified plaque but no flow limiting stenosis. Mild calcified plaque in the ICA bulb but no stenosis. Vertebral arteries: Vertebral artery origins are widely patent. Vertebral arteries are normal through the cervical region to the foramen magnum. Skeleton: Mild cervical spondylosis. Other neck: No mass or lymphadenopathy. Upper chest: Lung apices  are clear. Review of the MIP images confirms the above findings CTA HEAD FINDINGS Anterior circulation: Both internal carotid arteries are patent through the skull base and siphon regions. No siphon stenosis. The anterior and middle cerebral vessels are patent. No large vessel occlusion or proximal stenosis. No aneurysm or vascular malformation. Posterior circulation: Vertebral arteries are patent through the foramen magnum to the basilar artery. There is calcified plaque at the left vertebral artery V4 segment but no stenosis greater than 30%. No basilar stenosis. There is stenotic disease within both posterior cerebral arteries, worse on the left than the right. Venous sinuses: Patent and normal. Anatomic variants: None significant. Review of the MIP images confirms the above findings CT Brain Perfusion Findings: ASPECTS: 10 CBF (<30%) Volume: 0mL Perfusion (Tmax>6.0s) volume: 0mL Mismatch Volume: 0mL Infarction Location:None IMPRESSION: 1. No large vessel occlusion. 2. Atherosclerotic disease at both  carotid bifurcations but no stenosis. 3. Atherosclerotic disease at the left vertebral artery V4 segment but without stenosis greater than 30%. 4. Atherosclerotic disease within both posterior cerebral arteries, worse on the left than the right. 5. Negative perfusion study. Electronically Signed   By: Paulina Fusi M.D.   On: 09/12/2023 21:37   CT HEAD CODE STROKE WO CONTRAST Result Date: 09/12/2023 CLINICAL DATA:  Code stroke.  Neuro deficit, acute, stroke suspected EXAM: CT HEAD WITHOUT CONTRAST TECHNIQUE: Contiguous axial images were obtained from the base of the skull through the vertex without intravenous contrast. RADIATION DOSE REDUCTION: This exam was performed according to the departmental dose-optimization program which includes automated exposure control, adjustment of the mA and/or kV according to patient size and/or use of iterative reconstruction technique. COMPARISON:  10/20/2022 FINDINGS: Brain: No  sign of acute brain infarction. Age related volume loss. Chronic small-vessel ischemic changes of the white matter. No mass, hemorrhage, hydrocephalus or extra-axial collection. No abnormal vascular finding. Vascular: No abnormal vascular finding. Skull: Normal Sinuses/Orbits: Clear/normal Other: None ASPECTS (Alberta Stroke Program Early CT Score) - Ganglionic level infarction (caudate, lentiform nuclei, internal capsule, insula, M1-M3 cortex): 7 - Supraganglionic infarction (M4-M6 cortex): 3 Total score (0-10 with 10 being normal): 10 IMPRESSION: 1. No acute CT finding. Age related volume loss and chronic small-vessel ischemic changes of the white matter. 2. Aspects is 10. 3. These results were communicated to Dr. Amada Jupiter at 9:20 pm on 09/12/2023 by text page via the [facility] messaging system. Electronically Signed   By: Paulina Fusi M.D.   On: 09/12/2023 21:21    Procedures Procedures    Medications Ordered in ED Medications  iohexol (OMNIPAQUE) 350 MG/ML injection 100 mL (100 mLs Intravenous Contrast Given 09/12/23 2124)    ED Course/ Medical Decision Making/ A&P Clinical Course as of 09/12/23 2320  Mon Sep 12, 2023  2139 Needs MRI [RK]  2227 Admit to hospitalist [RK]  2256 Accepted by hospitalist [RK]    Clinical Course User Index [RK] Caron Presume, MD                                 Medical Decision Making Amount and/or Complexity of Data Reviewed Labs: ordered. Radiology: ordered.  Risk Decision regarding hospitalization.   83 year old patient arriving as a code stroke. Neurology was at bedside on arrival and we examined the patient together. She did not have any focal findings other than altered mental status and inability to spell world backwards. Her point-of-care sugar was 107 CT head was obtained emergently and did not show any signs of ICH or LVO.  Patient's initial workup, including CMP, showed no obvious electrolyte derangements, no kidney injury, no liver  injury.  Her CBC is reassuring with no leukocytosis or anemia.  Her ethanol level is undetectable.  Patient remains near her baseline.  AAO x 3, answering questions appropriately, but still with slurred speech and does not remember events that happened.  Given concern for possible hypertensive emergency, no results, will admit patient for additional monitoring and workup. Per neurology, patient will require MRI while inpatient.  Plan admit patient to hospitalist service.        Final Clinical Impression(s) / ED Diagnoses Final diagnoses:  Altered mental status, unspecified altered mental status type    Rx / DC Orders ED Discharge Orders     None         Caron Presume, MD 09/12/23  2320    Rondel Baton, MD 09/14/23 [phone]

## 2023-09-12 NOTE — Code Documentation (Signed)
 Responded to Code Stroke called at 2049 for unsteady gait and aphasia, [id number]. Pt arrived at 2105, CBG-107, NIH-0, CT/CTA/CTP-negative. Plan: TIA alert. Please complete VS/neuro checks q2h x 12, then q4h.

## 2023-09-12 NOTE — H&P (Signed)
 History and Physical  Caroline Snyder ZOX:[id number] DOB: [birth year] DOA: 09/12/2023  PCP: Sharon Seller, NP   Chief Complaint: Code stroke  HPI: Caroline Snyder is a 83 y.o. female with medical history significant for vascular dementia, HTN, HLD, depression and GERD who presented to the ED for evaluation of altered mental status.  Per son, patient was in her usual state of health until around 6 to 7 PM when family noticed that she was confused.  She sent a text message to one of her daughters but they did not make any sense. They went over to check on her and found her mumbling and incoherent. EMS was called and on the arrival patient was found to have SBP in the 200s.  They noticed that patient had not taken her BP meds today.  Patient was given her home BP meds, losartan and HCTZ her BP started improving.  Patient's mental status gradually improved and was back to her baseline on arrival to the ED.  On my evaluation, patient laying comfortably in bed.  She is alert, oriented to self, person, place and time.  Tells me that she sometimes forgets to take her BP meds.  She denies any confusions, headaches, vision changes, weakness, numbness or tingling, fevers, chills, abdominal pain, chest pain, palpitations, syncope or nausea or vomiting.  ED Course: Initial vitals showed temp 98, HR 67, BP 185/85, SpO2 98% on room air. Labs showed unremarkable CBC and CMP, normal ethanol levels, normal UDS, CBG 107, UA shows elevated specific gravity and mild ketonuria but no signs of infection.  EKG shows sinus rhythm with multiple artifacts.  CT head with no acute intracranial abnormalities. CTA head and neck with no LVO.  MRI brain with no no acute intracranial abnormalities. TR[facility] was consulted for admission  Review of Systems: Please see HPI for pertinent positives and negatives. A complete 10 system review of systems are otherwise negative.  Past Medical History:  Diagnosis Date   Allergy    Benign  neoplasm of colon 02/11/2012   Bunion 07/01/2010   Depression 05/05/2007   External hemorrhoids without mention of complication 03/24/1999   GERD (gastroesophageal reflux disease) 2000   Hyperlipidemia 06/29/2006   Memory loss 04/15/2003   Migraine without aura, without mention of intractable migraine without mention of status migrainosus 03/24/1999   Osteoporosis, unspecified 04/15/2004   Tear film insufficiency, unspecified 03/24/1999   Unspecified essential hypertension 11/22/2007   Past Surgical History:  Procedure Laterality Date   COLONOSCOPY  06-16-2007   Internal hemorrhoids,laxity of anal sphincter,polyp at 25cm form anal verge, tortuous sigmoid colon. Dr.Orr    EYE SURGERY Bilateral 2010   cataract Dr. Hazle Quant   VAGINAL HYSTERECTOMY  1980   Social History:  reports that she has never smoked. She has never used smokeless tobacco. She reports that she does not drink alcohol and does not use drugs.  Allergies  Allergen Reactions   Aricept [Donepezil] Diarrhea   Vioxx [Rofecoxib] Nausea Only    Pt. Can't remember what reaction was but asks to leave on her profile.     Family History  Problem Relation Age of Onset   Alzheimer's disease Mother    Diabetes Mother    Transient ischemic attack Mother    Heart disease Mother        MI, CHF   Alcohol abuse Father    Kidney disease Father    Hypertension Sister    Hypertension Brother    Heart disease Brother  Hypertension Brother    Hypertension Sister    Hypertension Brother    Other Son        truck accident 07/2016   Colon cancer Neg Hx    Stomach cancer Neg Hx      Prior to Admission medications   Medication Sig Start Date End Date Taking? Authorizing Provider  aspirin EC 81 MG tablet Take 1 tablet (81 mg total) by mouth daily. Swallow whole. 10/31/20   Sharon Seller, NP  Cholecalciferol 50 MCG (2000 UT) CAPS Take 1 tablet daily    [provider]  losartan (COZAAR) 100 MG tablet Take 1 tablet (100  mg total) by mouth daily. Essential hypertension I10 04/29/23   Sharon Seller, NP  memantine (NAMENDA XR) 7 MG CP24 24 hr capsule Take ER 7 mg capsule one by mouth daily x 1 week then  Take ER 14 mg capsule one by mouth daily x 1 week then increase to  ER 28 mg capsule one by mouth daily 09/06/23   [name], Dinah C, NP  Nutritional Supplements (ENSURE ACTIVE HIGH PROTEIN) LIQD Take 1 Bottle by mouth daily. 09/06/23   [name], Dinah C, NP  rosuvastatin (CRESTOR) 20 MG tablet TAKE 1 TABLET BY MOUTH EVERY DAY 04/15/23   Sharon Seller, NP    Physical Exam: BP (!) 172/91   Pulse 67   Temp 98 F (36.7 C)   Resp 17   Ht 5' (1.524 m)   Wt 46 kg   SpO2 97%   BMI 19.81 kg/m  General: Pleasant, well-appearing elderly woman.  Laying in bed. No acute distress. HEENT: [place]/AT. Anicteric sclera.  Dry mucous membrane. PERRLA. EOMI. CV: RRR. No murmurs, rubs, or gallops. No LE edema Pulmonary: Lungs CTAB. Normal effort. No wheezing or rales. Abdominal: Soft, nontender, nondistended. Normal bowel sounds. Extremities: Palpable radial and DP pulses. Normal ROM. Skin: Warm and dry. Decreased skin turgor. Neuro: A&Ox3. Moves all extremities. Tongue midline. No facial asymmetry. No focal weakness. Normal sensation to light touch.  Psych: Normal mood and affect          Labs on Admission:  Basic Metabolic Panel: Recent Labs  Lab 09/12/23 2106 09/12/23 2113  NA 137 139  K 3.8 3.1*  CL 100 104  CO2 23  --   GLUCOSE 106* 89  BUN 19 16  CREATININE 0.93 0.70  CALCIUM 9.4  --    Liver Function Tests: Recent Labs  Lab 09/12/23 2106  AST 21  ALT 14  ALKPHOS 34*  BILITOT 0.5  PROT 6.9  ALBUMIN 3.9   No results for input(s): "LIPASE", "AMYLASE" in the last 168 hours. No results for input(s): "AMMONIA" in the last 168 hours. CBC: Recent Labs  Lab 09/12/23 2106 09/12/23 2113  WBC 6.9  --   NEUTROABS 4.7  --   HGB 12.9 11.2*  HCT 38.2 33.0*  MCV 90.1  --   PLT 292  --    Cardiac  Enzymes: No results for input(s): "CKTOTAL", "CKMB", "CKMBINDEX", "TROPONINI" in the last 168 hours. BNP (last 3 results) No results for input(s): "BNP" in the last 8760 hours.  ProBNP (last 3 results) No results for input(s): "PROBNP" in the last 8760 hours.  CBG: Recent Labs  Lab 09/12/23 2106  GLUCAP 107*    Radiological Exams on Admission: CT ANGIO HEAD NECK W WO CM W PERF (CODE STROKE) Result Date: 09/12/2023 CLINICAL DATA:  Neuro deficit, acute, stroke suspected. EXAM: CT ANGIOGRAPHY HEAD AND NECK  CT PERFUSION BRAIN TECHNIQUE: Multidetector CT imaging of the head and neck was performed using the standard protocol during bolus administration of intravenous contrast. Multiplanar CT image reconstructions and MIPs were obtained to evaluate the vascular anatomy. Carotid stenosis measurements (when applicable) are obtained utilizing NASCET criteria, using the distal internal carotid diameter as the denominator. Multiphase CT imaging of the brain was performed following IV bolus contrast injection. Subsequent parametric perfusion maps were calculated using RAPID software. RADIATION DOSE REDUCTION: This exam was performed according to the departmental dose-optimization program which includes automated exposure control, adjustment of the mA and/or kV according to patient size and/or use of iterative reconstruction technique. CONTRAST:  OMNIPAQUE IOHEXOL 350 MG/ML SOLN COMPARISON:  Head CT same day FINDINGS: CTA NECK FINDINGS Aortic arch: Normal.  No atherosclerotic change. Right carotid system: Soft plaque along the course of the common carotid artery but without stenosis greater than 20%. At the bifurcation, there is calcified plaque but no stenosis. Cervical ICA widely patent. Left carotid system: Common carotid artery shows mild soft and calcified plaque but no flow limiting stenosis. Mild calcified plaque in the ICA bulb but no stenosis. Vertebral arteries: Vertebral artery origins are widely  patent. Vertebral arteries are normal through the cervical region to the foramen magnum. Skeleton: Mild cervical spondylosis. Other neck: No mass or lymphadenopathy. Upper chest: Lung apices are clear. Review of the MIP images confirms the above findings CTA HEAD FINDINGS Anterior circulation: Both internal carotid arteries are patent through the skull base and siphon regions. No siphon stenosis. The anterior and middle cerebral vessels are patent. No large vessel occlusion or proximal stenosis. No aneurysm or vascular malformation. Posterior circulation: Vertebral arteries are patent through the foramen magnum to the basilar artery. There is calcified plaque at the left vertebral artery V4 segment but no stenosis greater than 30%. No basilar stenosis. There is stenotic disease within both posterior cerebral arteries, worse on the left than the right. Venous sinuses: Patent and normal. Anatomic variants: None significant. Review of the MIP images confirms the above findings CT Brain Perfusion Findings: ASPECTS: 10 CBF (<30%) Volume: 0mL Perfusion (Tmax>6.0s) volume: 0mL Mismatch Volume: 0mL Infarction Location:None IMPRESSION: 1. No large vessel occlusion. 2. Atherosclerotic disease at both carotid bifurcations but no stenosis. 3. Atherosclerotic disease at the left vertebral artery V4 segment but without stenosis greater than 30%. 4. Atherosclerotic disease within both posterior cerebral arteries, worse on the left than the right. 5. Negative perfusion study. Electronically Signed   By: Paulina Fusi M.D.   On: 09/12/2023 21:37   CT HEAD CODE STROKE WO CONTRAST Result Date: 09/12/2023 CLINICAL DATA:  Code stroke.  Neuro deficit, acute, stroke suspected EXAM: CT HEAD WITHOUT CONTRAST TECHNIQUE: Contiguous axial images were obtained from the base of the skull through the vertex without intravenous contrast. RADIATION DOSE REDUCTION: This exam was performed according to the departmental dose-optimization program  which includes automated exposure control, adjustment of the mA and/or kV according to patient size and/or use of iterative reconstruction technique. COMPARISON:  10/20/2022 FINDINGS: Brain: No sign of acute brain infarction. Age related volume loss. Chronic small-vessel ischemic changes of the white matter. No mass, hemorrhage, hydrocephalus or extra-axial collection. No abnormal vascular finding. Vascular: No abnormal vascular finding. Skull: Normal Sinuses/Orbits: Clear/normal Other: None ASPECTS (Alberta Stroke Program Early CT Score) - Ganglionic level infarction (caudate, lentiform nuclei, internal capsule, insula, M1-M3 cortex): 7 - Supraganglionic infarction (M4-M6 cortex): 3 Total score (0-10 with 10 being normal): 10 IMPRESSION: 1. No acute CT  finding. Age related volume loss and chronic small-vessel ischemic changes of the white matter. 2. Aspects is 10. 3. These results were communicated to Dr. Amada Jupiter at 9:20 pm on 09/12/2023 by text page via the [facility] messaging system. Electronically Signed   By: Paulina Fusi M.D.   On: 09/12/2023 21:21   Assessment/Plan Caroline Snyder is a 83 y.o. female with medical history significant for vascular dementia, HTN, HLD, depression and GERD who presented to the ED for evaluation of altered mental status and admitted for hypertensive emergency.  # Hypertensive emergency, resolved # Acute encephalopathy, improved Elderly patient with history of vascular dementia found to have brief episode of altered mental status and home in the setting of significantly elevated BP with SBP in the 200s. BP improved with resuming of home BP meds. SBP currently in the 110s to 130s, CT head and MRI brain did not show any acute intracranial abnormalities. No evidence of metabolic or infectious cause. Patient A&O x 4 but does not remembers the episode. Her brief altered mental status likely hypertensive encephalopathy. Seizure also possible. Patient dry on exam. -Neurology  following, appreciate recs -Follow-up EEG -Give IV NS 1 L bolus -Continue home HCTZ and losartan -Check mag, Phos and TSH -PT/OT eval  # HLD -Continue rosuvastatin  # Vascular dementia -Continue home aspirin and memantine -Check vitamin B12 and D -Continue vitamin supplements   DVT prophylaxis: Lovenox     Code Status: Limited: Do not attempt resuscitation (DNR) -DNR-LIMITED -Do Not Intubate/DNI   Consults called: Neurology  Family Communication: Discussed admission with son near patient's room  Severity of Illness: The appropriate patient status for this patient is OBSERVATION. Observation status is judged to be reasonable and necessary in order to provide the required intensity of service to ensure the patient's safety. The patient's presenting symptoms, physical exam findings, and initial radiographic and laboratory data in the context of their medical condition is felt to place them at decreased risk for further clinical deterioration. Furthermore, it is anticipated that the patient will be medically stable for discharge from the hospital within 2 midnights of admission.   Level of care:    Steffanie Rainwater, MD 09/12/2023, 11:08 PM [facility] Pager: [phone] Isaiah [phone]:10   If 7PM-7AM, please contact night-coverage [email or web address] Password [email or web address]

## 2023-09-13 ENCOUNTER — Observation Stay (HOSPITAL_COMMUNITY): Payer: Medicare Other

## 2023-09-13 DIAGNOSIS — I161 Hypertensive emergency: Secondary | ICD-10-CM

## 2023-09-13 DIAGNOSIS — R569 Unspecified convulsions: Secondary | ICD-10-CM

## 2023-09-13 DIAGNOSIS — R4182 Altered mental status, unspecified: Secondary | ICD-10-CM

## 2023-09-13 DIAGNOSIS — I674 Hypertensive encephalopathy: Secondary | ICD-10-CM | POA: Diagnosis not present

## 2023-09-13 DIAGNOSIS — G9340 Encephalopathy, unspecified: Secondary | ICD-10-CM

## 2023-09-13 HISTORY — DX: Altered mental status, unspecified: R41.82

## 2023-09-13 HISTORY — DX: Hypertensive emergency: I16.1

## 2023-09-13 HISTORY — DX: Encephalopathy, unspecified: G93.40

## 2023-09-13 LAB — MAGNESIUM: Magnesium: 2 mg/dL (ref 1.7–2.4)

## 2023-09-13 LAB — VITAMIN B12: Vitamin B-12: 381 pg/mL (ref 180–914)

## 2023-09-13 LAB — VITAMIN D 25 HYDROXY (VIT D DEFICIENCY, FRACTURES): Vit D, 25-Hydroxy: 51.78 ng/mL (ref 30–100)

## 2023-09-13 LAB — TSH: TSH: 5.03 u[IU]/mL — ABNORMAL HIGH (ref 0.350–4.500)

## 2023-09-13 LAB — PHOSPHORUS: Phosphorus: 2.7 mg/dL (ref 2.5–4.6)

## 2023-09-13 MED ORDER — ADULT MULTIVITAMIN W/MINERALS CH
1.0000 | ORAL_TABLET | Freq: Every day | ORAL | Status: DC
  Administered 2023-09-13: 1 via ORAL
  Filled 2023-09-13: qty 1

## 2023-09-13 MED ORDER — OYSTER SHELL CALCIUM/D3 500-5 MG-MCG PO TABS
2.0000 | ORAL_TABLET | Freq: Every day | ORAL | Status: DC
Start: 2023-09-13 — End: 2023-09-13
  Filled 2023-09-13: qty 2

## 2023-09-13 MED ORDER — VITAMIN D 25 MCG (1000 UNIT) PO TABS
1000.0000 [IU] | ORAL_TABLET | Freq: Every day | ORAL | Status: DC
  Administered 2023-09-13: 1000 [IU] via ORAL
  Filled 2023-09-13: qty 1

## 2023-09-13 MED ORDER — SODIUM CHLORIDE 0.9 % IV BOLUS
1000.0000 mL | Freq: Once | INTRAVENOUS | Status: AC
  Administered 2023-09-13: 1000 mL via INTRAVENOUS

## 2023-09-13 MED ORDER — MEMANTINE HCL 10 MG PO TABS
10.0000 mg | ORAL_TABLET | Freq: Two times a day (BID) | ORAL | Status: DC
  Administered 2023-09-13: 10 mg via ORAL
  Filled 2023-09-13: qty 1

## 2023-09-13 MED ORDER — ROSUVASTATIN CALCIUM 20 MG PO TABS
20.0000 mg | ORAL_TABLET | Freq: Every day | ORAL | Status: DC
  Administered 2023-09-13: 20 mg via ORAL
  Filled 2023-09-13: qty 1

## 2023-09-13 MED ORDER — ASPIRIN 81 MG PO TBEC
81.0000 mg | DELAYED_RELEASE_TABLET | Freq: Every day | ORAL | Status: DC
  Administered 2023-09-13: 81 mg via ORAL
  Filled 2023-09-13: qty 1

## 2023-09-13 MED ORDER — ENSURE ENLIVE PO LIQD: Freq: Every day | ORAL | Status: DC

## 2023-09-13 MED ORDER — HYDROCHLOROTHIAZIDE 12.5 MG PO TABS
12.5000 mg | ORAL_TABLET | Freq: Every day | ORAL | Status: DC
  Administered 2023-09-13: 12.5 mg via ORAL
  Filled 2023-09-13: qty 1

## 2023-09-13 MED ORDER — RISAQUAD PO CAPS
1.0000 | ORAL_CAPSULE | Freq: Every day | ORAL | Status: DC
  Filled 2023-09-13 (×2): qty 1

## 2023-09-13 MED ORDER — LOSARTAN POTASSIUM 50 MG PO TABS
100.0000 mg | ORAL_TABLET | Freq: Every day | ORAL | Status: DC
  Administered 2023-09-13: 100 mg via ORAL
  Filled 2023-09-13: qty 2

## 2023-09-13 NOTE — Progress Notes (Addendum)
OT Cancellation Note  Patient Details Name: Caroline Snyder MRN: [id number] DOB: 03/14/1941   Cancelled Treatment:    Reason Eval/Treat Not Completed: OT screened, no needs identified, will sign off.  Patient discharging from ED at this time.     Daimon Kean OTR/L 09/13/2023, 12:11 PM

## 2023-09-13 NOTE — Evaluation (Signed)
Physical Therapy Evaluation and Discharge  Patient Details Name: Caroline Snyder MRN: [id number] DOB: 1941-03-16 Today's Date: 09/13/2023  History of Present Illness  Patient is a 83 year old female presenting for evaluation of altered mental status, admitted for hypertensive emergency. MRI of brain with no acute abnormality. History of  vascular dementia, HTN, HLD, depression and GERD  Clinical Impression  Patient is agreeable to PT evaluation. Supportive son at the bedside. Patient is usually independent with mobility and lives at home alone. Family checks in on her frequently.  Today, the patient is independent or modified independent with mobility. No dizziness is reported with upright activity. Patient ambulated without loss of balance with no assistive device. The patient is likely at or near her baseline level of functional independence. No acute PT needs identified at this time. PT will sign off.       If plan is discharge home, recommend the following: Assist for transportation;Direct supervision/assist for medications management   Can travel by private vehicle        Equipment Recommendations None recommended by PT  Recommendations for Other Services       Functional Status Assessment Patient has not had a recent decline in their functional status     Precautions / Restrictions Precautions Precautions:  (low fall risk) Restrictions Weight Bearing Restrictions Per Provider Order: No      Mobility  Bed Mobility Overal bed mobility: Modified Independent                  Transfers Overall transfer level: Independent                      Ambulation/Gait Ambulation/Gait assistance: Modified independent (Device/Increase time) (increased time) Gait Distance (Feet): 40 Feet Assistive device: None Gait Pattern/deviations: Step-through pattern Gait velocity: decreased     General Gait Details: mildly decreased gait speed, otherwise WFL with no loss  of balance without assistive device. no dizziness reported with ambulation. son deferred walking in the hallway as patient has not eaten in several hours  Stairs            Wheelchair Mobility     Tilt Bed    Modified Rankin (Stroke Patients Only)       Balance Overall balance assessment: Modified Independent                                           Pertinent Vitals/Pain Pain Assessment Pain Assessment: Faces Faces Pain Scale: Hurts a little bit Pain Location: right thigh Pain Descriptors / Indicators: Cramping Pain Intervention(s): Repositioned, Monitored during session    Home Living Family/patient expects to be discharged to:: Private residence Living Arrangements: Alone Available Help at Discharge: Family;Available PRN/intermittently Type of Home: House Home Access: Level entry       Home Layout: One level        Prior Function Prior Level of Function : Independent/Modified Independent;Driving             Mobility Comments: independent, driving short distances. supportive family checks in on her frequently ADLs Comments: independent     Extremity/Trunk Assessment   Upper Extremity Assessment Upper Extremity Assessment: Overall WFL for tasks assessed    Lower Extremity Assessment Lower Extremity Assessment: Overall WFL for tasks assessed       Communication   Communication Communication: No apparent difficulties  Cognition Arousal: Alert Behavior During Therapy: WFL for tasks assessed/performed   PT - Cognitive impairments: Memory, History of cognitive impairments                       PT - Cognition Comments: patient is able to follow all commands without difficulty. confusion about situation and recent events. history of cognitive impairments at baseline Following commands: Intact       Cueing       General Comments General comments (skin integrity, edema, etc.): patient is able to complete all  toileting tasks without assistance. she is able to reach outside base of of support and weight shift in all directions without loss of balance. blood pressure 119/69.    Exercises     Assessment/Plan    PT Assessment Patient does not need any further PT services  PT Problem List         PT Treatment Interventions      PT Goals (Current goals can be found in the Care Plan section)  Acute Rehab PT Goals PT Goal Formulation: All assessment and education complete, DC therapy    Frequency       Co-evaluation               AM-PAC PT "6 Clicks" Mobility  Outcome Measure Help needed turning from your back to your side while in a flat bed without using bedrails?: None Help needed moving from lying on your back to sitting on the side of a flat bed without using bedrails?: None Help needed moving to and from a bed to a chair (including a wheelchair)?: None Help needed standing up from a chair using your arms (e.g., wheelchair or bedside chair)?: None Help needed to walk in hospital room?: None Help needed climbing 3-5 steps with a railing? : A Little 6 Click Score: 23    End of Session   Activity Tolerance: Patient tolerated treatment well Patient left: in bed;with call bell/phone within reach;with family/visitor present (supportive son at the bedside) Nurse Communication: Mobility status PT Visit Diagnosis: Muscle weakness (generalized) (M62.81)    Time: 1610-9604 PT Time Calculation (min) (ACUTE ONLY): 9 min   Charges:   PT Evaluation $PT Eval Low Complexity: 1 Low   PT General Charges $$ ACUTE PT VISIT: 1 Visit         Donna Bernard, PT, MPT  Ina Homes 09/13/2023, 9:45 AM

## 2023-09-13 NOTE — Discharge Summary (Signed)
Physician Discharge Summary   Patient: Caroline Snyder MRN: [id number] DOB: [birth year]  Admit date:     09/12/2023  Discharge date: {dischdate:26783}  Discharge Physician: Marolyn Haller   PCP: Sharon Seller, NP   Recommendations at discharge:  {Tip this will not be part of the note when signed- Example include specific recommendations for outpatient follow-up, pending tests to follow-up on. (Optional):26781}  ***  Discharge Diagnoses: Principal Problem:   Hypertensive emergency Active Problems:   Acute encephalopathy   Altered mental status  Resolved Problems:   * No resolved hospital problems. [facility] Course: No notes on file  Assessment and Plan: No notes have been filed under this hospital service. Service: Hospitalist     {Tip this will not be part of the note when signed Body mass index is 19.81 kg/m. , ,  (Optional):26781}  {(NOTE) Pain control PDMP Statment (Optional):26782} Consultants: *** Procedures performed: ***  Disposition: {Plan; Disposition:26390} Diet recommendation:  {Diet_Plan:26776} DISCHARGE MEDICATION: Allergies as of 09/13/2023       Reactions   Aricept [donepezil] Diarrhea   Vioxx [rofecoxib] Nausea Only   Pt. Can't remember what reaction was but asks to leave on her profile.      Med Rec must be completed prior to using this [facility]***       Follow-up Information     Sharon Seller, NP Follow up in 1 week(s).   Specialty: Geriatric Medicine Contact information: [address] [place] [address] [phone]                Discharge Exam: Filed Weights   09/12/23 2111 09/12/23 2136  Weight: 45.9 kg 46 kg   ***  Condition at discharge: {DC Condition:26389}  The results of significant diagnostics from this hospitalization (including imaging, microbiology, ancillary and laboratory) are listed below for reference.   Imaging Studies: EEG adult Result Date: 09/13/2023 Charlsie Quest,  MD     09/13/2023 11:21 AM Patient Name: Caroline Snyder MRN: [id number] Epilepsy Attending: Charlsie Quest Referring Physician/Provider: Steffanie Rainwater, MD Date: 09/13/2023 Duration: 22.37 mins Patient history:  83 y.o. female with acute encephalopathy which is rapidly improving. EEG to evaluate for seizure Level of alertness: Awake, asleep AEDs during EEG study: None Technical aspects: This EEG study was done with scalp electrodes positioned according to the 10-20 International system of electrode placement. Electrical activity was reviewed with band pass filter of 1-70Hz , sensitivity of 7 uV/mm, display speed of 69mm/sec with a 60Hz  notched filter applied as appropriate. EEG data were recorded continuously and digitally stored.  Video monitoring was available and reviewed as appropriate. Description: The posterior dominant rhythm consists of 8 Hz activity of moderate voltage (25-35 uV) seen predominantly in posterior head regions, symmetric and reactive to eye opening and eye closing. Sleep was characterized by vertex waves, sleep spindles (12 to 14 Hz), maximal frontocentral region. Hyperventilation and photic stimulation were not performed.   IMPRESSION: This study is within normal limits. No seizures or epileptiform discharges were seen throughout the recording. A normal interictal EEG does not exclude the diagnosis of epilepsy. Charlsie Quest   MR BRAIN WO CONTRAST Result Date: 09/13/2023 CLINICAL DATA:  Stroke, follow up EXAM: MRI HEAD WITHOUT CONTRAST TECHNIQUE: Multiplanar, multiecho pulse sequences of the brain and surrounding structures were obtained without intravenous contrast. COMPARISON:  CT head from today. FINDINGS: Brain: No acute infarction, hemorrhage, hydrocephalus, extra-axial collection or mass lesion. Moderate T2/FLAIR hyperintensities in the white matter nonspecific but  compatible with chronic microvascular ischemic disease. Vascular: Major arterial flow voids are maintained.  Skull and upper cervical spine: Normal marrow signal. Sinuses/Orbits: Clear sinuses.  No acute orbital findings. Other: No mastoid effusions. IMPRESSION: No evidence of acute intracranial abnormality. Electronically Signed   By: Feliberto Harts M.D.   On: 09/13/2023 00:45   CT ANGIO HEAD NECK W WO CM W PERF (CODE STROKE) Result Date: 09/12/2023 CLINICAL DATA:  Neuro deficit, acute, stroke suspected. EXAM: CT ANGIOGRAPHY HEAD AND NECK CT PERFUSION BRAIN TECHNIQUE: Multidetector CT imaging of the head and neck was performed using the standard protocol during bolus administration of intravenous contrast. Multiplanar CT image reconstructions and MIPs were obtained to evaluate the vascular anatomy. Carotid stenosis measurements (when applicable) are obtained utilizing NASCET criteria, using the distal internal carotid diameter as the denominator. Multiphase CT imaging of the brain was performed following IV bolus contrast injection. Subsequent parametric perfusion maps were calculated using RAPID software. RADIATION DOSE REDUCTION: This exam was performed according to the departmental dose-optimization program which includes automated exposure control, adjustment of the mA and/or kV according to patient size and/or use of iterative reconstruction technique. CONTRAST:  OMNIPAQUE IOHEXOL 350 MG/ML SOLN COMPARISON:  Head CT same day FINDINGS: CTA NECK FINDINGS Aortic arch: Normal.  No atherosclerotic change. Right carotid system: Soft plaque along the course of the common carotid artery but without stenosis greater than 20%. At the bifurcation, there is calcified plaque but no stenosis. Cervical ICA widely patent. Left carotid system: Common carotid artery shows mild soft and calcified plaque but no flow limiting stenosis. Mild calcified plaque in the ICA bulb but no stenosis. Vertebral arteries: Vertebral artery origins are widely patent. Vertebral arteries are normal through the cervical region to the foramen  magnum. Skeleton: Mild cervical spondylosis. Other neck: No mass or lymphadenopathy. Upper chest: Lung apices are clear. Review of the MIP images confirms the above findings CTA HEAD FINDINGS Anterior circulation: Both internal carotid arteries are patent through the skull base and siphon regions. No siphon stenosis. The anterior and middle cerebral vessels are patent. No large vessel occlusion or proximal stenosis. No aneurysm or vascular malformation. Posterior circulation: Vertebral arteries are patent through the foramen magnum to the basilar artery. There is calcified plaque at the left vertebral artery V4 segment but no stenosis greater than 30%. No basilar stenosis. There is stenotic disease within both posterior cerebral arteries, worse on the left than the right. Venous sinuses: Patent and normal. Anatomic variants: None significant. Review of the MIP images confirms the above findings CT Brain Perfusion Findings: ASPECTS: 10 CBF (<30%) Volume: 0mL Perfusion (Tmax>6.0s) volume: 0mL Mismatch Volume: 0mL Infarction Location:None IMPRESSION: 1. No large vessel occlusion. 2. Atherosclerotic disease at both carotid bifurcations but no stenosis. 3. Atherosclerotic disease at the left vertebral artery V4 segment but without stenosis greater than 30%. 4. Atherosclerotic disease within both posterior cerebral arteries, worse on the left than the right. 5. Negative perfusion study. Electronically Signed   By: Paulina Fusi M.D.   On: 09/12/2023 21:37   CT HEAD CODE STROKE WO CONTRAST Result Date: 09/12/2023 CLINICAL DATA:  Code stroke.  Neuro deficit, acute, stroke suspected EXAM: CT HEAD WITHOUT CONTRAST TECHNIQUE: Contiguous axial images were obtained from the base of the skull through the vertex without intravenous contrast. RADIATION DOSE REDUCTION: This exam was performed according to the departmental dose-optimization program which includes automated exposure control, adjustment of the mA and/or kV according  to patient size and/or use of iterative reconstruction  technique. COMPARISON:  10/20/2022 FINDINGS: Brain: No sign of acute brain infarction. Age related volume loss. Chronic small-vessel ischemic changes of the white matter. No mass, hemorrhage, hydrocephalus or extra-axial collection. No abnormal vascular finding. Vascular: No abnormal vascular finding. Skull: Normal Sinuses/Orbits: Clear/normal Other: None ASPECTS (Alberta Stroke Program Early CT Score) - Ganglionic level infarction (caudate, lentiform nuclei, internal capsule, insula, M1-M3 cortex): 7 - Supraganglionic infarction (M4-M6 cortex): 3 Total score (0-10 with 10 being normal): 10 IMPRESSION: 1. No acute CT finding. Age related volume loss and chronic small-vessel ischemic changes of the white matter. 2. Aspects is 10. 3. These results were communicated to Dr. Amada Jupiter at 9:20 pm on 09/12/2023 by text page via the [facility] messaging system. Electronically Signed   By: Paulina Fusi M.D.   On: 09/12/2023 21:21    Microbiology: Results for orders placed or performed in visit on 07/30/22  Culture, Urine     Status: Abnormal   Collection Time: 07/30/22 11:17 AM   Specimen: Urine  Result Value Ref Range Status   MICRO NUMBER: [id number]  Final   SPECIMEN QUALITY: Adequate  Final   Sample Source URINE, CLEAN CATCH  Final   STATUS: FINAL  Final   ISOLATE 1: Escherichia coli (A)  Final    Comment: Greater than 100,000 CFU/mL of Escherichia coli      Susceptibility   Escherichia coli - URINE CULTURE, REFLEX    AMOX/CLAVULANIC 8 Sensitive     AMPICILLIN 16 Intermediate     AMPICILLIN/SULBACTAM 4 Sensitive     CEFAZOLIN* <=4 Not Reportable      * For infections other than uncomplicated UTI caused by E. coli, K. pneumoniae or P. mirabilis: Cefazolin is resistant if MIC > or = 8 mcg/mL. (Distinguishing susceptible versus intermediate for isolates with MIC < or = 4 mcg/mL requires additional testing.) For uncomplicated UTI caused by E.  coli, K. pneumoniae or P. mirabilis: Cefazolin is susceptible if MIC <32 mcg/mL and predicts susceptible to the oral agents cefaclor, cefdinir, cefpodoxime, cefprozil, cefuroxime, cephalexin and loracarbef.     CEFTAZIDIME <=1 Sensitive     CEFEPIME <=1 Sensitive     CEFTRIAXONE <=1 Sensitive     CIPROFLOXACIN <=0.25 Sensitive     LEVOFLOXACIN <=0.12 Sensitive     GENTAMICIN <=1 Sensitive     IMIPENEM <=0.25 Sensitive     NITROFURANTOIN <=16 Sensitive     PIP/TAZO <=4 Sensitive     TOBRAMYCIN <=1 Sensitive     TRIMETH/SULFA* <=20 Sensitive      * For infections other than uncomplicated UTI caused by E. coli, K. pneumoniae or P. mirabilis: Cefazolin is resistant if MIC > or = 8 mcg/mL. (Distinguishing susceptible versus intermediate for isolates with MIC < or = 4 mcg/mL requires additional testing.) For uncomplicated UTI caused by E. coli, K. pneumoniae or P. mirabilis: Cefazolin is susceptible if MIC <32 mcg/mL and predicts susceptible to the oral agents cefaclor, cefdinir, cefpodoxime, cefprozil, cefuroxime, cephalexin and loracarbef. Legend: S = Susceptible  I = Intermediate R = Resistant  NS = Not susceptible * = Not tested  NR = Not reported **NN = See antimicrobic comments     Labs: CBC: Recent Labs  Lab 09/12/23 2106 09/12/23 2113  WBC 6.9  --   NEUTROABS 4.7  --   HGB 12.9 11.2*  HCT 38.2 33.0*  MCV 90.1  --   PLT 292  --    Basic Metabolic Panel: Recent Labs  Lab 09/12/23 2106 09/12/23 2113 09/13/23 0340  NA 137 139  --   K 3.8 3.1*  --   CL 100 104  --   CO2 23  --   --   GLUCOSE 106* 89  --   BUN 19 16  --   CREATININE 0.93 0.70  --   CALCIUM 9.4  --   --   MG  --   --  2.0  PHOS  --   --  2.7   Liver Function Tests: Recent Labs  Lab 09/12/23 2106  AST 21  ALT 14  ALKPHOS 34*  BILITOT 0.5  PROT 6.9  ALBUMIN 3.9   CBG: Recent Labs  Lab 09/12/23 2106  GLUCAP 107*    Discharge time spent: {LESS THAN/GREATER THAN:26388} 30  minutes.  Signed: Marolyn Haller, MD [facility] 09/13/2023

## 2023-09-13 NOTE — ED Notes (Signed)
Patient ambulatory to restroom  ?

## 2023-09-13 NOTE — ED Notes (Signed)
Pt desiring to leave AMA, MD office called and paged. No response as of now. Pt GCS 15

## 2023-09-13 NOTE — Discharge Instructions (Addendum)
Please make sure you stay hydrated and take your medications as prescribed. \If worsened confusion or new weakness please come to the emergency department.

## 2023-09-13 NOTE — Procedures (Signed)
Patient Name: Caroline Snyder  MRN: [id number]  Epilepsy Attending: Charlsie Quest  Referring Physician/Provider: Steffanie Rainwater, MD  Date: 09/13/2023 Duration: 22.37 mins  Patient history:  83 y.o. female with acute encephalopathy which is rapidly improving. EEG to evaluate for seizure  Level of alertness: Awake, asleep  AEDs during EEG study: None  Technical aspects: This EEG study was done with scalp electrodes positioned according to the 10-20 International system of electrode placement. Electrical activity was reviewed with band pass filter of 1-70Hz , sensitivity of 7 uV/mm, display speed of 29mm/sec with a 60Hz  notched filter applied as appropriate. EEG data were recorded continuously and digitally stored.  Video monitoring was available and reviewed as appropriate.  Description: The posterior dominant rhythm consists of 8 Hz activity of moderate voltage (25-35 uV) seen predominantly in posterior head regions, symmetric and reactive to eye opening and eye closing. Sleep was characterized by vertex waves, sleep spindles (12 to 14 Hz), maximal frontocentral region. Hyperventilation and photic stimulation were not performed.     IMPRESSION: This study is within normal limits. No seizures or epileptiform discharges were seen throughout the recording.  A normal interictal EEG does not exclude the diagnosis of epilepsy.  Jacqueli Pangallo Annabelle Harman

## 2023-09-13 NOTE — ED Notes (Signed)
MD at bedside agree with dc. Verbally discharged pt and grandson verbalized understanding. IV removed. Pt able to ambulate with steady gait.

## 2023-09-13 NOTE — ED Notes (Signed)
EEG at bedside.

## 2023-09-13 NOTE — Consult Note (Signed)
 NEUROLOGY CONSULT NOTE   Date of service: September 13, 2023 Patient Name: Caroline Snyder MRN:  [id number] DOB:  Nov 24, 1940 Chief Complaint: "Altered mental status" Requesting Provider: Steffanie Rainwater, MD  History of Present Illness  Caroline Snyder is a 83 y.o. female with hx of memory loss, hyperlipidemia who presents with acute encephalopathy.  She was in her normal state of health when the family checked on her around 1:30 PM.  When they checked on cameras around 7 PM, they found her to be significantly confused.  They went over to check on her and found her to be mumbling, and relatively incoherent.  EMS was called and she was given her blood pressure medicines by family(was quite hypertensive on EMS arrival).  Her symptoms rapidly improved, but a code stroke was activated anyway.  LKW: 1:30 PM Modified rankin score: 2-Slight disability-UNABLE to perform all activities but does not need assistance IV Thrombolysis: Out of window EVT: No LVO  NIHSS components Score: Comment  1a Level of Conscious 0[x]  1[]  2[]  3[]      1b LOC Questions 0[x]  1[]  2[]       1c LOC Commands 0[x]  1[]  2[]       2 Best Gaze 0[x]  1[]  2[]       3 Visual 0[x]  1[]  2[]  3[]      4 Facial Palsy 0[x]  1[]  2[]  3[]      5a Motor Arm - left 0[x]  1[]  2[]  3[]  4[]  UN[]    5b Motor Arm - Right 0[x]  1[]  2[]  3[]  4[]  UN[]    6a Motor Leg - Left 0[x]  1[]  2[]  3[]  4[]  UN[]    6b Motor Leg - Right 0[x]  1[]  2[]  3[]  4[]  UN[]    7 Limb Ataxia 0[x]  1[]  2[]  3[]  UN[]     8 Sensory 0[x]  1[]  2[]  UN[]      9 Best Language 0[x]  1[]  2[]  3[]      10 Dysarthria 0[x]  1[]  2[]  UN[]      11 Extinct. 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 Dr.Orr    EYE SURGERY Bilateral 2010   cataract Dr. Hazle Quant   VAGINAL HYSTERECTOMY  1980    Family History: Family History  Problem Relation Age of Onset   Alzheimer's disease Mother    Diabetes Mother    Transient ischemic attack Mother    Heart disease Mother        MI, CHF   Alcohol abuse Father    Kidney disease Father    Hypertension Sister    Hypertension Brother    Heart disease Brother    Hypertension Brother    Hypertension Sister    Hypertension Brother    Other Son        truck accident 07/2016   Colon cancer Neg Hx  Stomach cancer Neg Hx     Social History  reports that she has never smoked. She has never used smokeless tobacco. She reports that she does not drink alcohol and does not use drugs.  Allergies  Allergen Reactions   Aricept [Donepezil] Diarrhea   Vioxx [Rofecoxib] Nausea Only    Pt. Can't remember what reaction was but asks to leave on her profile.     Medications   Current Facility-Administered Medications:    acetaminophen (TYLENOL) tablet 650 mg, 650 mg, Oral, Q6H PRN **OR** acetaminophen (TYLENOL) suppository 650 mg, 650 mg, Rectal, Q6H PRN, Kirke Corin, Flossie Buffy, MD   enoxaparin (LOVENOX) injection 30 mg, 30 mg, Subcutaneous, Daily, Amponsah, Flossie Buffy, MD   ondansetron (ZOFRAN) tablet 4 mg, 4 mg, Oral, Q6H PRN **OR** ondansetron (ZOFRAN) injection 4 mg, 4 mg, Intravenous, Q6H PRN, Amponsah, Flossie Buffy, MD   senna-docusate (Senokot-S) tablet 1 tablet, 1 tablet, Oral, QHS PRN, Steffanie Rainwater, MD  Current Outpatient Medications:    aspirin EC 81 MG tablet, Take 1 tablet (81 mg total) by mouth daily. Swallow whole., Disp: 30 tablet, Rfl: 11   Cholecalciferol 50 MCG (2000 UT) CAPS, Take 1 tablet daily, Disp: , Rfl:    losartan (COZAAR) 100 MG tablet, Take 1 tablet (100 mg total) by mouth daily. Essential hypertension I10, Disp: 90 tablet, Rfl: 3   memantine (NAMENDA XR) 7 MG CP24 24 hr capsule, Take ER 7 mg capsule one by mouth daily x 1 week then  Take ER 14 mg capsule one by mouth daily x 1 week then increase to  ER 28 mg capsule one by mouth daily, Disp: 30 capsule, Rfl: 3   Nutritional Supplements (ENSURE ACTIVE HIGH PROTEIN) LIQD, Take 1 Bottle by mouth daily., Disp: 237 mL, Rfl: 3   rosuvastatin (CRESTOR) 20 MG tablet, TAKE 1 TABLET BY MOUTH EVERY DAY, Disp: 90 tablet, Rfl: 1  Vitals   Vitals:   10-09-23 2225 09/13/23 0015 09/13/23 0030 09/13/23 0045  BP:  (!) 170/88 (!) 156/85 (!) 163/86  Pulse:  67 65 73  Resp: 17     Temp: 98 F (36.7 C)     SpO2:  100% 100% 100%  Weight:      Height:        Body mass index is 19.81 kg/m.  Physical Exam   Constitutional: Appears well-developed and well-nourished.  Neurologic Examination    Neuro: Mental Status: Patient is awake, alert, oriented to person, place, month, year, and situation. She is still somewhat confused, and is unable to spell world backwards, and has some latency of speech, taking time before answering questions but is fluent when she answers.  Cranial Nerves: II: Visual Fields are full. Pupils are equal, round, and reactive to light.   III,IV, VI: EOMI without ptosis or diploplia.  V: Facial sensation is symmetric to temperature VII: Facial movement is symmetric.  VIII: hearing is intact to voice X: Uvula elevates symmetrically XII: tongue is midline without atrophy or fasciculations.  Motor: Tone is normal. Bulk is normal. 5/5 strength was present in all four extremities.   Sensory: Sensation is symmetric to light touch and temperature in the arms and legs. Cerebellar: FNF and HKS are intact bilaterally        Labs/Imaging/Neurodiagnostic studies   CBC:  Recent Labs  Lab October 09, 2023 2106 10-09-2023 2113  WBC 6.9  --   NEUTROABS 4.7  --   HGB 12.9 11.2*  HCT 38.2 33.0*  MCV 90.1  --  PLT 292  --    Basic Metabolic Panel:  Lab Results  Component Value Date   NA 139 09/12/2023   K 3.1 (L) 09/12/2023   CO2 23 09/12/2023   GLUCOSE 89 09/12/2023   BUN 16 09/12/2023   CREATININE 0.70 09/12/2023   CALCIUM 9.4 09/12/2023   GFRNONAA >60 09/12/2023   GFRAA 67 01/12/2021   Lipid Panel:  Lab Results  Component Value Date   LDLCALC 93 04/08/2023   HgbA1c: No results found for: "HGBA1C" Urine Drug Screen:     Component Value Date/Time   LABOPIA NONE DETECTED 09/12/2023 2105   COCAINSCRNUR NONE DETECTED 09/12/2023 2105   LABBENZ NONE DETECTED 09/12/2023 2105   AMPHETMU NONE DETECTED 09/12/2023 2105   THCU NONE DETECTED 09/12/2023 2105   LABBARB NONE DETECTED 09/12/2023 2105    Alcohol Level     Component Value Date/Time   ETH <10 09/12/2023 2106   INR  Lab Results  Component Value Date   INR 1.0 09/12/2023   APTT  Lab Results  Component Value Date   APTT 28 09/12/2023   AED levels: No results found for: "PHENYTOIN", "ZONISAMIDE", "LAMOTRIGINE", "LEVETIRACETA"  CT Head without contrast(Personally reviewed): Negative  CT angio Head and Neck with contrast(Personally reviewed): Negative   ASSESSMENT   Caroline Snyder is a 83 y.o. female with acute encephalopathy which is rapidly improving.  The patient does not seem to have a clear memory of the problems that she was having, which is unusual with ischemic stroke.  Seizure would be a possibility, as would be hypertensive encephalopathy.  TIA would be possible, but I think relatively less likely  RECOMMENDATIONS  1) MRI brain, stroke workup if positive 2) EEG 3) agree with  hypertension control 4) neurology will follow ______________________________________________________________________    Signed, Ritta Slot, MD [facility]

## 2023-09-13 NOTE — Progress Notes (Signed)
EEG complete - results pending

## 2023-09-27 ENCOUNTER — Other Ambulatory Visit: Payer: Self-pay | Admitting: *Deleted

## 2023-09-27 DIAGNOSIS — M810 Age-related osteoporosis without current pathological fracture: Secondary | ICD-10-CM

## 2023-09-27 MED ORDER — DENOSUMAB 60 MG/ML ~~LOC~~ SOSY: 60.0000 mg | PREFILLED_SYRINGE | Freq: Once | SUBCUTANEOUS | Status: AC

## 2023-09-28 ENCOUNTER — Encounter: Payer: Medicare Other | Admitting: Sports Medicine

## 2023-09-29 NOTE — Progress Notes (Signed)
 This encounter was created in error - please disregard.

## 2023-10-07 ENCOUNTER — Ambulatory Visit: Payer: Medicare Other

## 2023-10-14 ENCOUNTER — Encounter: Payer: Self-pay | Admitting: Nurse Practitioner

## 2023-10-14 ENCOUNTER — Encounter: Payer: Medicare Other | Admitting: Nurse Practitioner

## 2023-10-14 NOTE — Progress Notes (Signed)
 This encounter was created in error - please disregard.

## 2023-10-23 ENCOUNTER — Other Ambulatory Visit: Payer: Self-pay | Admitting: Nurse Practitioner

## 2023-12-12 ENCOUNTER — Ambulatory Visit: Admitting: Nurse Practitioner

## 2023-12-13 ENCOUNTER — Encounter: Payer: Self-pay | Admitting: Family

## 2023-12-13 ENCOUNTER — Ambulatory Visit (INDEPENDENT_AMBULATORY_CARE_PROVIDER_SITE_OTHER): Payer: Medicare (Managed Care) | Admitting: Family

## 2023-12-13 VITALS — BP 114/70 | HR 75 | Temp 97.6°F | Ht 60.0 in | Wt 98.6 lb

## 2023-12-13 DIAGNOSIS — R197 Diarrhea, unspecified: Secondary | ICD-10-CM

## 2023-12-13 DIAGNOSIS — R110 Nausea: Secondary | ICD-10-CM | POA: Diagnosis not present

## 2023-12-13 DIAGNOSIS — R1084 Generalized abdominal pain: Secondary | ICD-10-CM | POA: Diagnosis not present

## 2023-12-13 LAB — COMPLETE METABOLIC PANEL WITHOUT GFR
AG Ratio: 1.7 (calc) (ref 1.0–2.5)
ALT: 386 U/L — ABNORMAL HIGH (ref 6–29)
AST: 273 U/L — ABNORMAL HIGH (ref 10–35)
Albumin: 4.2 g/dL (ref 3.6–5.1)
Alkaline phosphatase (APISO): 271 U/L — ABNORMAL HIGH (ref 37–153)
BUN/Creatinine Ratio: 19 (calc) (ref 6–22)
BUN: 18 mg/dL (ref 7–25)
CO2: 28 mmol/L (ref 20–32)
Calcium: 10 mg/dL (ref 8.6–10.4)
Chloride: 106 mmol/L (ref 98–110)
Creat: 0.96 mg/dL — ABNORMAL HIGH (ref 0.60–0.95)
Globulin: 2.5 g/dL (ref 1.9–3.7)
Glucose, Bld: 85 mg/dL (ref 65–139)
Potassium: 4.2 mmol/L (ref 3.5–5.3)
Sodium: 142 mmol/L (ref 135–146)
Total Bilirubin: 0.5 mg/dL (ref 0.2–1.2)
Total Protein: 6.7 g/dL (ref 6.1–8.1)

## 2023-12-13 LAB — CBC WITH DIFFERENTIAL/PLATELET
Absolute Lymphocytes: 1260 {cells}/uL (ref 850–3900)
Absolute Monocytes: 447 {cells}/uL (ref 200–950)
Basophils Absolute: 63 {cells}/uL (ref 0–200)
Basophils Relative: 1 %
Eosinophils Absolute: 63 {cells}/uL (ref 15–500)
Eosinophils Relative: 1 %
HCT: 38.9 % (ref 35.0–45.0)
Hemoglobin: 12.8 g/dL (ref 11.7–15.5)
MCH: 30.2 pg (ref 27.0–33.0)
MCHC: 32.9 g/dL (ref 32.0–36.0)
MCV: 91.7 fL (ref 80.0–100.0)
MPV: 10.6 fL (ref 7.5–12.5)
Monocytes Relative: 7.1 %
Neutro Abs: 4467 {cells}/uL (ref 1500–7800)
Neutrophils Relative %: 70.9 %
Platelets: 312 10*3/uL (ref 140–400)
RBC: 4.24 10*6/uL (ref 3.80–5.10)
RDW: 12.3 % (ref 11.0–15.0)
Total Lymphocyte: 20 %
WBC: 6.3 10*3/uL (ref 3.8–10.8)

## 2023-12-13 LAB — AMYLASE: Amylase: 93 U/L (ref 21–101)

## 2023-12-13 LAB — LIPASE: Lipase: 38 U/L (ref 7–60)

## 2023-12-13 MED ORDER — LOPERAMIDE HCL 2 MG PO TABS: 2.0000 mg | ORAL_TABLET | Freq: Four times a day (QID) | ORAL | 0 refills | Status: DC | PRN

## 2023-12-13 MED ORDER — ONDANSETRON HCL 4 MG PO TABS: 4.0000 mg | ORAL_TABLET | Freq: Three times a day (TID) | ORAL | 0 refills | Status: DC | PRN

## 2023-12-13 NOTE — Progress Notes (Signed)
 Provider: Amanda Pote FNP-C  Verma Gobble, NP  Patient Care Team: Verma Gobble, NP as PCP - General (Geriatric Medicine) Amedeo Jupiter, MD as Consulting Physician (Ophthalmology) Corie Diamond, MD as Consulting Physician (Ophthalmology) Davia Erps, MD (Inactive) as Consulting Physician (Gynecology)  Extended Emergency Contact Information Primary Emergency Contact: McLamb,Randall  [place] Home Phone: [phone] Relation: Son Secondary Emergency Contact: [facility] Home Phone: [phone] Relation: Other  Code Status:  Full Code  Goals of care: Advanced Directive information    09/12/2023    9:37 PM  Advanced Directives  Does Patient Have a Medical Advance Directive? Yes     Chief Complaint  Patient presents with   Abdominal Pain    Having diaherra that started yesterday , stomach cramping , she had some salad yesterday that when pain started , no fever ,some  nausea     Discussed the use of AI scribe software for clinical note transcription with the patient, who gave verbal consent to proceed.  History of Present Illness   Caroline Snyder "Caroline Snyder" is an 83 year old female who presents with diarrhea and nausea.  She began experiencing diarrhea yesterday after consuming a homemade salad with tomatoes, cucumbers, and lettuce, along with some crackers. The diarrhea was more severe yesterday but continues today, with bowel movements providing some relief. No fever, chills, or blood in the stool, although she noticed something red, likely from the tomatoes.  She also has significant nausea, which is more pronounced today, but has not vomited. She describes mild abdominal pain primarily on the left lower side without cramping. Her diet today included a sandwich with banana and peanut butter, and she is maintaining a bland diet to help with her symptoms. She is drinking fluids, including Gatorade, to stay hydrated, with her son  ensuring she has Gatorade available.  She missed an appointment yesterday due to her symptoms and needs to reschedule. She drove herself to the appointment today as her son was unavailable due to work commitments. Her son is protective of her, especially after the passing of his father and her youngest son.         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 Dr.Orr    EYE SURGERY Bilateral 2010   cataract Dr. Danley Dusky   VAGINAL HYSTERECTOMY  1980    Allergies  Allergen Reactions   Aricept  [Donepezil ] Diarrhea   Vioxx [Rofecoxib] Nausea Only    Pt. Can't remember what reaction was but asks to leave on her profile.     Outpatient Encounter Medications as of 12/13/2023  Medication Sig   acetaminophen  (TYLENOL ) 500 MG tablet Take 500 mg by mouth every 6 (six) hours as needed for mild pain (pain score 1-3) or headache.   aspirin  EC 81 MG tablet Take 1 tablet (81 mg total) by mouth daily. Swallow whole.   Calcium  Carb-Cholecalciferol  (  CALCIUM -VITAMIN D3) 600-12.5 MG-MCG CAPS Take 2 capsules by mouth daily.   Cholecalciferol  25 MCG (1000 UT) capsule Take 1,000 Units by mouth daily. Take 1 tablet daily   Coenzyme Q10-Vitamin E (QUNOL ULTRA COQ10 PO) Take 1 capsule by mouth daily.   hydrochlorothiazide   (HYDRODIURIL ) 25 MG tablet Take 12.5 mg by mouth daily.   loperamide (IMODIUM A-D) 2 MG tablet Take 1 tablet (2 mg total) by mouth 4 (four) times daily as needed for diarrhea or loose stools.   losartan  (COZAAR ) 100 MG tablet Take 1 tablet (100 mg total) by mouth daily. Essential hypertension I10   memantine  (NAMENDA ) 10 MG tablet Take 10 mg by mouth 2 (two) times daily.   Multiple Vitamins-Minerals (MULTIVITAMIN GUMMIES WOMENS PO) Take 2 capsules by mouth 2 (two) times daily. Take 6 gummies daily.   Multiple Vitamins-Minerals (PRESERVISION AREDS 2) CAPS Take 1 capsule by mouth daily.   Nutritional Supplements (ENSURE ACTIVE HIGH PROTEIN) LIQD Take 1 Bottle by mouth daily.   ondansetron  (ZOFRAN ) 4 MG tablet Take 1 tablet (4 mg total) by mouth every 8 (eight) hours as needed for nausea or vomiting.   Probiotic Product ([employer] ADULT PROBIOTIC) CAPS Take 1 capsule by mouth daily.   rosuvastatin  (CRESTOR ) 20 MG tablet TAKE 1 TABLET BY MOUTH EVERY DAY   Facility-Administered Encounter Medications as of 12/13/2023  Medication   denosumab  (PROLIA ) injection 60 mg    Review of Systems  Constitutional:  Negative for appetite change, chills, fatigue, fever and unexpected weight change.  HENT:  Negative for congestion, hearing loss, nosebleeds, postnasal drip, rhinorrhea, sinus pressure, sinus pain, sneezing, sore throat, tinnitus and trouble swallowing.   Eyes:  Negative for pain, discharge, redness, itching and visual disturbance.  Respiratory:  Negative for cough, chest tightness, shortness of breath and wheezing.   Cardiovascular:  Negative for chest pain, palpitations and leg swelling.  Gastrointestinal:  Positive for abdominal pain, diarrhea, nausea and vomiting. Negative for abdominal distention, blood in stool and constipation.       N/V/D   Genitourinary:  Negative for difficulty urinating, dysuria, flank pain, frequency and urgency.  Musculoskeletal:  Negative for arthralgias, back pain, gait  problem, joint swelling and myalgias.  Skin:  Negative for color change, pallor, rash and wound.  Neurological:  Negative for dizziness, syncope, speech difficulty, weakness, light-headedness, numbness and headaches.    Immunization History  Administered Date(s) Administered   Fluad Quad(high Dose 65+) 04/26/2019, 04/21/2020   Influenza, High Dose Seasonal PF 03/11/2017, 06/09/2018, 03/19/2023   Influenza,inj,Quad PF,6+ Mos 07/19/2013, 05/28/2016, 06/02/2017   Influenza,inj,quad, With Preservative 06/08/2021   Influenza-Unspecified 05/23/2015   Moderna Covid-19 Fall Seasonal Vaccine 49yrs & older 06/29/2022   PFIZER(Purple Top)SARS-COV-2 Vaccination 08/22/2019, 09/12/2019, 05/29/2020   PNEUMOCOCCAL CONJUGATE-20 09/16/2022   PPD Test 11/30/2013, 05/30/2015   Pfizer Covid-19 Vaccine Bivalent Booster 65yrs & up 06/08/2021   Pneumococcal Conjugate-13 12/24/2014, 05/28/2016   Pneumococcal Polysaccharide-23 06/09/2018   Tdap 03/11/2017, 11/17/2018   Zoster Recombinant(Shingrix) 10/05/2022   Zoster, Live 11/19/2009   Pertinent  Health Maintenance Due  Topic Date Due   INFLUENZA VACCINE  03/02/2024   DEXA SCAN  Completed   Colonoscopy  Discontinued      07/30/2022   10:26 AM 10/01/2022    2:33 PM 04/08/2023    1:05 PM 04/22/2023   11:51 AM 12/13/2023    9:44 AM  Fall Risk  Falls in the past year? 0 0 0 0 0  Was there an injury with Fall? 0 0 0 0  0  Fall Risk Category Calculator 0 0 0 0 0  Fall Risk Category (Retired) Low      (RETIRED) Patient Fall Risk Level Low fall risk      Patient at Risk for Falls Due to No Fall Risks No Fall Risks No Fall Risks No Fall Risks No Fall Risks  Fall risk Follow up Falls evaluation completed Falls evaluation completed Falls evaluation completed Falls evaluation completed Falls prevention discussed;Falls evaluation completed   Functional Status Survey:    Vitals:   12/13/23 0942  BP: 114/70  Pulse: 75  Temp: 97.6 F (36.4 C)  TempSrc: Temporal   SpO2: 99%  Weight: 98 lb 9.6 oz (44.7 kg)  Height: 5' (1.524 m)   Body mass index is 19.26 kg/m. PHYS Exam  VITALS: T- 97.6, P- 75, BP- 114/70, SaO2- 99% GENERAL: Alert, cooperative, well developed, no acute distress. HEENT: Normocephalic, normal oropharynx, moist mucous membranes. CHEST: Clear to auscultation bilaterally, no wheezes, rhonchi, or crackles. CARDIOVASCULAR: Normal heart rate and rhythm, S1 and S2 normal without murmurs. ABDOMEN: Soft, slight tender on left lower quadrant , non-distended, without organomegaly, normal bowel sounds. EXTREMITIES: No cyanosis, edema, or swelling in the legs. NEUROLOGICAL: Cranial nerves grossly intact, moves all extremities without gross motor or sensory deficit.      Labs reviewed: Recent Labs    04/08/23 1337 09/12/23 2106 09/12/23 2113 09/13/23 0340  NA 133* 137 139  --   K 4.1 3.8 3.1*  --   CL 98 100 104  --   CO2 23 23  --   --   GLUCOSE 87 106* 89  --   BUN 22 19 16   --   CREATININE 1.00* 0.93 0.70  --   CALCIUM  9.4 9.4  --   --   MG  --   --   --  2.0  PHOS  --   --   --  2.7   Recent Labs    04/08/23 1337 09/12/23 2106  AST 18 21  ALT 12 14  ALKPHOS  --  34*  BILITOT 0.3 0.5  PROT 6.6 6.9  ALBUMIN  --  3.9   Recent Labs    04/08/23 1337 09/12/23 2106 09/12/23 2113  WBC 6.3 6.9  --   NEUTROABS 4,177 4.7  --   HGB 13.2 12.9 11.2*  HCT 38.6 38.2 33.0*  MCV 91.3 90.1  --   PLT 367 292  --    Lab Results  Component Value Date   TSH 5.030 (H) 09/13/2023   No results found for: "HGBA1C" Lab Results  Component Value Date   CHOL 190 04/08/2023   HDL 70 04/08/2023   LDLCALC 93 04/08/2023   TRIG 170 (H) 04/08/2023   CHOLHDL 2.7 04/08/2023    Significant Diagnostic Results in last 30 days:  No results found.  Assessment/Plan  Diarrhea and nausea/vomiting  Acute onset of diarrhea and nausea likely due to dietary indiscretion involving a homemade salad. No fever, chills, or blood in stool. Mild  abdominal discomfort in the lower left quadrant without significant tenderness. Symptoms expected to resolve within 24 to 48 hours with supportive care. Emphasized hydration and a bland diet. Discussed over-the-counter Imodium for diarrhea and prescribed Zofran  for nausea management. - Advise bland diet including toast, bananas, and oatmeal for gastrointestinal rest. - Encourage increased fluid intake to prevent dehydration, including water and Gatorade for electrolyte replacement. - Recommend over-the-counter Imodium for excessive diarrhea. - Prescribe Zofran  for nausea, to be taken  30 minutes before meals as needed. - Order blood work to assess overall health status. - Instruct to seek medical attention if symptoms worsen or do not improve.   Family/ staff Communication: Reviewed plan of care with patient verbalized understanding   Labs/tests ordered:  - CBC/diff - CMP - Lipase - Amylase   Next Appointment: Return if symptoms worsen or fail to improve.   Total time: 20 minutes. Greater than 50% of total time spent doing patient education regarding diarrhea,nausea vomiting,abdominal pain,health maintenance including symptom/medication management.   Estil Heman, NP

## 2023-12-13 NOTE — Patient Instructions (Signed)
Bland Diet A bland diet may consist of soft foods or foods that are not high in fat or are not greasy, acidic, or spicy. Avoiding certain foods may cause less irritation to your mouth, throat, stomach, or gastrointestinal tract. Avoiding certain foods may make you feel better. Everyone's tolerances are different. A bland diet should be based on what you can tolerate and what may cause discomfort. What is my plan? Your health care provider or dietitian may recommend specific changes to your diet to treat your symptoms. These changes may include: Eating small meals frequently. Cooking food until it is soft enough to chew easily. Taking the time to chew your food thoroughly, so it is easy to swallow and digest. Avoiding foods that cause you discomfort. These may include spicy food, fried food, greasy foods, hard-to-chew foods, or citrus fruits and juices. Drinking slowly. What are tips for following this plan? Reading food labels To reduce fiber intake, look for food labels that say "whole," such as whole wheat or whole grain. Shopping Avoid food items that may have nuts or seeds. Avoid vegetables that may make you gassy or have a tough texture, such as broccoli, cauliflower, or corn. Cooking Cook foods thoroughly so they have a soft texture. Meal planning Make sure you include foods from all food groups to eat a balanced diet. Eat a variety of types of foods. Eat foods and drink beverages that do not cause you discomfort. These may include soups and broths with cooked meats, pasta, and vegetables. Lifestyle Sit up after meals, avoid tight clothing, and take time to eat and chew your food slowly. Ask your health care provider whether you should take dietary supplements. General information Mildly season your foods. Some seasonings, such as cayenne pepper, vinegar, or hot sauce, may cause irritation. The foods, beverages, or seasonings to avoid should be based on individual tolerance. What  foods should I eat? Fruits Canned or cooked fruit such as peaches, pears, or applesauce. Bananas. Vegetables Well-cooked vegetables. Canned or cooked vegetables such as carrots, green beans, beets, or spinach. Mashed or boiled potatoes. Grains  Hot cereals, such as cream of wheat and processed oatmeal. Rice. Bread, crackers, pasta, or tortillas made from refined white flour. Meats and other proteins  Eggs. Creamy peanut butter or other nut butters. Lean, well-cooked tender meats, such as beef, pork, chicken, or fish. Dairy Low-fat dairy products such as milk, cottage cheese, or yogurt. Beverages  Water. Herbal tea. Apple juice. Fats and oils Mild salad dressings. Canola or olive oil. Sweets and desserts Low-fat pudding, custard, or ice cream. Fruit gelatin. The items listed above may not be a complete list of foods and beverages you can eat. Contact a dietitian for more information. What foods should I avoid? Fruits Citrus fruits, such as oranges and grapefruit. Fruits with a stringy texture. Fruits that have lots of seeds, such as kiwi or strawberries. Dried fruits. Vegetables Raw, uncooked vegetables. Salads. Grains Whole grain breads, muffins, and cereals. Meats and other proteins Tough, fibrous meats. Highly seasoned meat such as corned beef, smoked meats, or fish. Processed high-fat meats such as brats, hot dogs, or sausage. Dairy Full-fat dairy foods such as ice cream and cheese. Beverages Caffeinated drinks. Alcohol. Seasonings and condiments Strongly flavored seasonings or condiments. Hot sauce. Salsa. Other foods Spicy foods. Fried or greasy foods. Sour foods, such as pickled or fermented foods like sauerkraut. Foods high in fiber. The items listed above may not be a complete list of foods and beverages you should  avoid. Contact a dietitian for more information. Summary A bland diet should be based on individual tolerance. It may consist of foods that are soft  textured and do not have a lot of fat, fiber, acid, or seasonings. A bland diet may be recommended because avoiding certain foods, beverages, or spices may make you feel better. This information is not intended to replace advice given to you by your health care provider. Make sure you discuss any questions you have with your health care provider. Document Revised: 06/08/2021 Document Reviewed: 06/08/2021 Elsevier Patient Education  2024 [employer].

## 2023-12-18 ENCOUNTER — Inpatient Hospital Stay (HOSPITAL_COMMUNITY): Payer: Medicare (Managed Care)

## 2023-12-18 ENCOUNTER — Inpatient Hospital Stay (HOSPITAL_COMMUNITY)
Admission: EM | Admit: 2023-12-18 | Discharge: 2023-12-23 | DRG: 435 | Disposition: A | Discharge status: Home / Self Care | Payer: Medicare (Managed Care) | Attending: Family Medicine | Admitting: Family Medicine

## 2023-12-18 ENCOUNTER — Emergency Department (HOSPITAL_COMMUNITY): Payer: Medicare (Managed Care)

## 2023-12-18 ENCOUNTER — Other Ambulatory Visit: Payer: Self-pay

## 2023-12-18 ENCOUNTER — Encounter (HOSPITAL_COMMUNITY): Payer: Self-pay

## 2023-12-18 DIAGNOSIS — C240 Malignant neoplasm of extrahepatic bile duct: Secondary | ICD-10-CM | POA: Diagnosis not present

## 2023-12-18 DIAGNOSIS — K838 Other specified diseases of biliary tract: Secondary | ICD-10-CM | POA: Diagnosis not present

## 2023-12-18 DIAGNOSIS — K219 Gastro-esophageal reflux disease without esophagitis: Secondary | ICD-10-CM | POA: Diagnosis present

## 2023-12-18 DIAGNOSIS — R112 Nausea with vomiting, unspecified: Secondary | ICD-10-CM | POA: Diagnosis present

## 2023-12-18 DIAGNOSIS — R918 Other nonspecific abnormal finding of lung field: Secondary | ICD-10-CM | POA: Diagnosis present

## 2023-12-18 DIAGNOSIS — R1010 Upper abdominal pain, unspecified: Secondary | ICD-10-CM | POA: Diagnosis not present

## 2023-12-18 DIAGNOSIS — R932 Abnormal findings on diagnostic imaging of liver and biliary tract: Secondary | ICD-10-CM | POA: Diagnosis not present

## 2023-12-18 DIAGNOSIS — K2971 Gastritis, unspecified, with bleeding: Secondary | ICD-10-CM

## 2023-12-18 DIAGNOSIS — E785 Hyperlipidemia, unspecified: Secondary | ICD-10-CM | POA: Diagnosis not present

## 2023-12-18 DIAGNOSIS — I10 Essential (primary) hypertension: Secondary | ICD-10-CM | POA: Diagnosis present

## 2023-12-18 DIAGNOSIS — K869 Disease of pancreas, unspecified: Secondary | ICD-10-CM | POA: Diagnosis present

## 2023-12-18 DIAGNOSIS — D630 Anemia in neoplastic disease: Secondary | ICD-10-CM | POA: Diagnosis present

## 2023-12-18 DIAGNOSIS — Z8249 Family history of ischemic heart disease and other diseases of the circulatory system: Secondary | ICD-10-CM

## 2023-12-18 DIAGNOSIS — K831 Obstruction of bile duct: Secondary | ICD-10-CM | POA: Diagnosis not present

## 2023-12-18 DIAGNOSIS — M549 Dorsalgia, unspecified: Secondary | ICD-10-CM | POA: Diagnosis not present

## 2023-12-18 DIAGNOSIS — K8689 Other specified diseases of pancreas: Secondary | ICD-10-CM | POA: Diagnosis not present

## 2023-12-18 DIAGNOSIS — Z79899 Other long term (current) drug therapy: Secondary | ICD-10-CM

## 2023-12-18 DIAGNOSIS — R1310 Dysphagia, unspecified: Secondary | ICD-10-CM | POA: Diagnosis not present

## 2023-12-18 DIAGNOSIS — K2951 Unspecified chronic gastritis with bleeding: Secondary | ICD-10-CM | POA: Diagnosis not present

## 2023-12-18 DIAGNOSIS — R7401 Elevation of levels of liver transaminase levels: Principal | ICD-10-CM

## 2023-12-18 DIAGNOSIS — M19012 Primary osteoarthritis, left shoulder: Secondary | ICD-10-CM | POA: Diagnosis present

## 2023-12-18 DIAGNOSIS — F339 Major depressive disorder, recurrent, unspecified: Secondary | ICD-10-CM | POA: Diagnosis not present

## 2023-12-18 DIAGNOSIS — R110 Nausea: Secondary | ICD-10-CM | POA: Diagnosis not present

## 2023-12-18 DIAGNOSIS — F0150 Vascular dementia without behavioral disturbance: Secondary | ICD-10-CM | POA: Diagnosis present

## 2023-12-18 DIAGNOSIS — R748 Abnormal levels of other serum enzymes: Secondary | ICD-10-CM | POA: Diagnosis not present

## 2023-12-18 DIAGNOSIS — Z7982 Long term (current) use of aspirin: Secondary | ICD-10-CM

## 2023-12-18 DIAGNOSIS — F32A Depression, unspecified: Secondary | ICD-10-CM | POA: Diagnosis not present

## 2023-12-18 DIAGNOSIS — Z90710 Acquired absence of both cervix and uterus: Secondary | ICD-10-CM

## 2023-12-18 DIAGNOSIS — I899 Noninfective disorder of lymphatic vessels and lymph nodes, unspecified: Secondary | ICD-10-CM | POA: Diagnosis not present

## 2023-12-18 DIAGNOSIS — B9681 Helicobacter pylori [H. pylori] as the cause of diseases classified elsewhere: Secondary | ICD-10-CM | POA: Diagnosis not present

## 2023-12-18 DIAGNOSIS — R1319 Other dysphagia: Secondary | ICD-10-CM | POA: Diagnosis not present

## 2023-12-18 LAB — CBC WITH DIFFERENTIAL/PLATELET
Abs Immature Granulocytes: 0.02 10*3/uL (ref 0.00–0.07)
Basophils Absolute: 0 10*3/uL (ref 0.0–0.1)
Basophils Relative: 1 %
Eosinophils Absolute: 0.1 10*3/uL (ref 0.0–0.5)
Eosinophils Relative: 1 %
HCT: 37.4 % (ref 36.0–46.0)
Hemoglobin: 12.2 g/dL (ref 12.0–15.0)
Immature Granulocytes: 0 %
Lymphocytes Relative: 30 %
Lymphs Abs: 1.6 10*3/uL (ref 0.7–4.0)
MCH: 30.8 pg (ref 26.0–34.0)
MCHC: 32.6 g/dL (ref 30.0–36.0)
MCV: 94.4 fL (ref 80.0–100.0)
Monocytes Absolute: 0.4 10*3/uL (ref 0.1–1.0)
Monocytes Relative: 8 %
Neutro Abs: 3.1 10*3/uL (ref 1.7–7.7)
Neutrophils Relative %: 60 %
Platelets: 260 10*3/uL (ref 150–400)
RBC: 3.96 MIL/uL (ref 3.87–5.11)
RDW: 13 % (ref 11.5–15.5)
WBC: 5.3 10*3/uL (ref 4.0–10.5)
nRBC: 0 % (ref 0.0–0.2)

## 2023-12-18 LAB — COMPREHENSIVE METABOLIC PANEL WITH GFR
ALT: 378 U/L — ABNORMAL HIGH (ref 0–44)
AST: 350 U/L — ABNORMAL HIGH (ref 15–41)
Albumin: 3.7 g/dL (ref 3.5–5.0)
Alkaline Phosphatase: 315 U/L — ABNORMAL HIGH (ref 38–126)
Anion gap: 10 (ref 5–15)
BUN: 17 mg/dL (ref 8–23)
CO2: 24 mmol/L (ref 22–32)
Calcium: 9.3 mg/dL (ref 8.9–10.3)
Chloride: 103 mmol/L (ref 98–111)
Creatinine, Ser: 0.98 mg/dL (ref 0.44–1.00)
GFR, Estimated: 57 mL/min — ABNORMAL LOW (ref >60)
Glucose, Bld: 105 mg/dL — ABNORMAL HIGH (ref 70–99)
Potassium: 3.9 mmol/L (ref 3.5–5.1)
Sodium: 137 mmol/L (ref 135–145)
Total Bilirubin: 1 mg/dL (ref 0.0–1.2)
Total Protein: 6.7 g/dL (ref 6.5–8.1)

## 2023-12-18 LAB — URINALYSIS, ROUTINE W REFLEX MICROSCOPIC
Bilirubin Urine: NEGATIVE
Glucose, UA: NEGATIVE mg/dL
Hgb urine dipstick: NEGATIVE
Ketones, ur: NEGATIVE mg/dL
Leukocytes,Ua: NEGATIVE
Nitrite: NEGATIVE
Protein, ur: NEGATIVE mg/dL
Specific Gravity, Urine: 1.003 — ABNORMAL LOW (ref 1.005–1.030)
pH: 6 (ref 5.0–8.0)

## 2023-12-18 LAB — LIPASE, BLOOD: Lipase: 106 U/L — ABNORMAL HIGH (ref 11–51)

## 2023-12-18 MED ORDER — ENOXAPARIN SODIUM 30 MG/0.3ML IJ SOSY
30.0000 mg | PREFILLED_SYRINGE | INTRAMUSCULAR | Status: DC
  Administered 2023-12-18 – 2023-12-22 (×2): 30 mg via SUBCUTANEOUS
  Filled 2023-12-18 (×3): qty 0.3

## 2023-12-18 MED ORDER — MEMANTINE HCL 10 MG PO TABS
10.0000 mg | ORAL_TABLET | Freq: Two times a day (BID) | ORAL | Status: DC
  Filled 2023-12-18 (×3): qty 1

## 2023-12-18 MED ORDER — SODIUM CHLORIDE 0.9 % IV BOLUS
500.0000 mL | Freq: Once | INTRAVENOUS | Status: AC
  Administered 2023-12-18: 500 mL via INTRAVENOUS

## 2023-12-18 MED ORDER — LOSARTAN POTASSIUM 50 MG PO TABS
100.0000 mg | ORAL_TABLET | Freq: Once | ORAL | Status: AC
  Administered 2023-12-18: 100 mg via ORAL
  Filled 2023-12-18: qty 2

## 2023-12-18 MED ORDER — ROSUVASTATIN CALCIUM 20 MG PO TABS
20.0000 mg | ORAL_TABLET | Freq: Every day | ORAL | Status: DC
  Administered 2023-12-18 – 2023-12-22 (×5): 20 mg via ORAL
  Filled 2023-12-18 (×5): qty 1

## 2023-12-18 MED ORDER — GADOBUTROL 1 MMOL/ML IV SOLN
5.0000 mL | Freq: Once | INTRAVENOUS | Status: AC | PRN
  Administered 2023-12-18: 5 mL via INTRAVENOUS

## 2023-12-18 MED ORDER — ONDANSETRON HCL 4 MG/2ML IJ SOLN
4.0000 mg | Freq: Four times a day (QID) | INTRAMUSCULAR | Status: DC | PRN
  Administered 2023-12-19 – 2023-12-22 (×4): 4 mg via INTRAVENOUS
  Filled 2023-12-18 (×3): qty 2

## 2023-12-18 MED ORDER — SODIUM CHLORIDE 0.9 % IV SOLN: INTRAVENOUS | Status: AC

## 2023-12-18 MED ORDER — HYDRALAZINE HCL 25 MG PO TABS
25.0000 mg | ORAL_TABLET | Freq: Three times a day (TID) | ORAL | Status: DC | PRN
  Administered 2023-12-18: 25 mg via ORAL
  Filled 2023-12-18: qty 1

## 2023-12-18 MED ORDER — HYDROCHLOROTHIAZIDE 12.5 MG PO TABS
12.5000 mg | ORAL_TABLET | Freq: Every day | ORAL | Status: DC
  Administered 2023-12-18 – 2023-12-23 (×6): 12.5 mg via ORAL
  Filled 2023-12-18 (×6): qty 1

## 2023-12-18 MED ORDER — ONDANSETRON HCL 4 MG/2ML IJ SOLN
4.0000 mg | Freq: Once | INTRAMUSCULAR | Status: AC
  Administered 2023-12-18: 4 mg via INTRAVENOUS
  Filled 2023-12-18: qty 2

## 2023-12-18 NOTE — ED Triage Notes (Signed)
 Pt came in via POV d/t n/v, fatigue & feeling weak for the past week. Denies recent wt loss, urinary issues or abd pain. Family reports that when she had an emesis event she feels like she may have "pulled" something in her back & it hurts behind the Lt shoulder.

## 2023-12-18 NOTE — ED Provider Notes (Signed)
 [place] [facility] Provider Note   CSN: [id number] Arrival date & time: 12/18/23  1230     History  Chief Complaint  Patient presents with   Emesis   Weakness   Fatigue   HPI XINYI BATTON is a 83 y.o. female with hypertension, hyperlipidemia, GERD, osteoporosis presenting for generalized weakness, fatigue and emesis.  The symptoms started around Monday.  Her son reports that she ate a salad that day and later that day started to vomit. States she is may have vomited 3-4 times in the last couple days as well.  Had a normal bowel movement yesterday.  Denies fever.  She denies abdominal pain and urinary symptoms.  Denies excessive Tylenol  use and alcohol use.  States she also may have "pulled something in her back and that it hurts behind her left shoulder.  Denies any radiation of that pain into her chest.  Denies shortness of breath.  Also mentioned that she did not take her BP meds today because she was feeling poorly.  She denies headache, visual changes.   Emesis Weakness Associated symptoms: vomiting        Home Medications Prior to Admission medications   Medication Sig Start Date End Date Taking? Authorizing Provider  acetaminophen  (TYLENOL ) 500 MG tablet Take 500 mg by mouth every 6 (six) hours as needed for mild pain (pain score 1-3) or headache.    [provider]  aspirin  EC 81 MG tablet Take 1 tablet (81 mg total) by mouth daily. Swallow whole. 10/31/20   Eubanks, Jessica K, NP  Calcium  Carb-Cholecalciferol  (CALCIUM -VITAMIN D3) 600-12.5 MG-MCG CAPS Take 2 capsules by mouth daily.    [provider]  Cholecalciferol  25 MCG (1000 UT) capsule Take 1,000 Units by mouth daily. Take 1 tablet daily    [provider]  Coenzyme Q10-Vitamin E (QUNOL ULTRA COQ10 PO) Take 1 capsule by mouth daily.    [provider]  hydrochlorothiazide  (HYDRODIURIL ) 25 MG tablet Take 12.5 mg by mouth daily.    [provider]  loperamide  (IMODIUM  A-D) 2 MG tablet Take 1 tablet (2 mg total) by mouth 4 (four) times daily as needed for diarrhea or loose stools. 12/13/23   [name], Dinah C, NP  losartan  (COZAAR ) 100 MG tablet Take 1 tablet (100 mg total) by mouth daily. Essential hypertension I10 04/29/23   Verma Gobble, NP  memantine  (NAMENDA ) 10 MG tablet Take 10 mg by mouth 2 (two) times daily. 09/06/23   [provider]  Multiple Vitamins-Minerals (MULTIVITAMIN GUMMIES WOMENS PO) Take 2 capsules by mouth 2 (two) times daily. Take 6 gummies daily.    [provider]  Multiple Vitamins-Minerals (PRESERVISION AREDS 2) CAPS Take 1 capsule by mouth daily.    [provider]  Nutritional Supplements (ENSURE ACTIVE HIGH PROTEIN) LIQD Take 1 Bottle by mouth daily. 09/06/23   [name], Dinah C, NP  ondansetron  (ZOFRAN ) 4 MG tablet Take 1 tablet (4 mg total) by mouth every 8 (eight) hours as needed for nausea or vomiting. 12/13/23   [name], Dinah C, NP  Probiotic Product ([employer] ADULT PROBIOTIC) CAPS Take 1 capsule by mouth daily.    [provider]  rosuvastatin  (CRESTOR ) 20 MG tablet TAKE 1 TABLET BY MOUTH EVERY DAY 10/24/23   Eubanks, Jessica K, NP      Allergies    Aricept  [donepezil ] and Vioxx [rofecoxib]    Review of Systems   Review of Systems  Gastrointestinal:  Positive for  vomiting.  Neurological:  Positive for weakness.    Physical Exam   Vitals:   12/18/23 1350 12/18/23 1415  BP: (!) 217/89 (!) 170/77  Pulse: 62 60  Resp: (!) 25 13  Temp:    SpO2: 100% 100%    CONSTITUTIONAL:  well-appearing, NAD NEURO:  GCS 15. Speech is goal oriented. No deficits appreciated to CN III-XII; symmetric eyebrow raise, no facial drooping, tongue midline. Patient has equal grip strength bilaterally with 5/5 strength against resistance in all major muscle groups bilaterally. Sensation to light touch intact. Patient moves extremities without ataxia. Normal finger-nose-finger. Did  not assess gait.  EYES:  eyes equal and reactive ENT/NECK:  Supple, no stridor CARDIO:  regular rate and rhythm, appears well-perfused  PULM:  No respiratory distress, CTAB GI/GU:  non-distended, soft, upper abdominal tenderness, no guarding MSK/SPINE:  No gross deformities, no edema, moves all extremities  SKIN:  no rash, atraumatic  *Additional and/or pertinent findings included in MDM below  ED Results / Procedures / Treatments   Labs (all labs ordered are listed, but only abnormal results are displayed) Labs Reviewed  COMPREHENSIVE METABOLIC PANEL WITH GFR - Abnormal; Notable for the following components:      Result Value   Glucose, Bld 105 (*)    AST 350 (*)    ALT 378 (*)    Alkaline Phosphatase 315 (*)    GFR, Estimated 57 (*)    All other components within normal limits  LIPASE, BLOOD - Abnormal; Notable for the following components:   Lipase 106 (*)    All other components within normal limits  URINALYSIS, ROUTINE W REFLEX MICROSCOPIC - Abnormal; Notable for the following components:   Color, Urine STRAW (*)    Specific Gravity, Urine 1.003 (*)    All other components within normal limits  URINE CULTURE  CBC WITH DIFFERENTIAL/PLATELET    EKG EKG Interpretation Date/Time:  Sunday Dec 18 2023 12:45:10 EDT Ventricular Rate:  63 PR Interval:  120 QRS Duration:  88 QT Interval:  420 QTC Calculation: 429 R Axis:   55  Text Interpretation: Normal sinus rhythm Normal ECG When compared with ECG of 12-Sep-2023 21:34, PREVIOUS ECG IS PRESENT Confirmed by Almond Army ([id number]) on 12/18/2023 2:04:04 PM  Radiology DG Shoulder Left Result Date: 12/18/2023 CLINICAL DATA:  Pain. EXAM: LEFT SHOULDER - 2+ VIEW COMPARISON:  None Available. FINDINGS: There is no evidence of fracture or dislocation. Minor acromioclavicular spurring. Glenohumeral joint space is preserved with trace inferior glenoid spurring. No erosions or suspicious bone lesions. Soft tissues are unremarkable.  IMPRESSION: Minor acromioclavicular and glenohumeral osteoarthritis. Electronically Signed   By: Chadwick Colonel M.D.   On: 12/18/2023 15:21   US  Abdomen Limited RUQ (LIVER/GB) Result Date: 12/18/2023 CLINICAL DATA:  Right upper quadrant pain skip EXAM: ULTRASOUND ABDOMEN LIMITED RIGHT UPPER QUADRANT COMPARISON:  None Available. FINDINGS: Gallbladder: No gallstones or wall thickening visualized. No sonographic Murphy sign noted by sonographer. Common bile duct: Diameter: 6 mm-9 mm in left lateral decubitus. No intrahepatic biliary ductal dilatation. Liver: No focal lesion identified. Within normal limits in parenchymal echogenicity. Portal vein is patent on color Doppler imaging with normal direction of blood flow towards the liver. Other: None. IMPRESSION: 1. No cholelithiasis or sonographic evidence for acute cholecystitis. 2. Common bile duct is mildly dilated measuring up to 9 mm. Recommend correlation with LFTs. This can be further evaluated with ERCP or MRCP. Electronically Signed   By: Tyron Gallon M.D.   On: 12/18/2023 15:05  Procedures Procedures    Medications Ordered in ED Medications  hydrochlorothiazide  (HYDRODIURIL ) tablet 12.5 mg (12.5 mg Oral Given 12/18/23 1418)  losartan  (COZAAR ) tablet 100 mg (100 mg Oral Given 12/18/23 1417)  sodium chloride  0.9 % bolus 500 mL (500 mLs Intravenous New Bag/Given 12/18/23 1430)  ondansetron  (ZOFRAN ) injection 4 mg (4 mg Intravenous Given 12/18/23 1426)    ED Course/ Medical Decision Making/ A&P Clinical Course as of 12/18/23 1547  Sun Dec 18, 2023  1514 S; generalized weakness, N/V started on Monday; mild upper abd pain; GI called, Mai Schwalbe; admit to medicine  [WC]    Clinical Course User Index [WC] Arminda Landmark, MD                                 Medical Decision Making Amount and/or Complexity of Data Reviewed Labs: ordered. Radiology: ordered.  Risk Prescription drug management.   Initial Impression and  Ddx 83 year old well-appearing female presenting for nausea vomiting and generalized weakness.  Exam notable for upper abdominal tenderness.  DDx includes acute pancreatitis, acute cholecystitis, kidney stone, ACS, arrhythmia, electrolyte derangement, orthostasis, stroke, other. Patient PMH that increases complexity of ED encounter: hypertension, hyperlipidemia, GERD, osteoporosis  Interpretation of Diagnostics - I independent reviewed and interpreted the labs as followed: Elevated lipase, elevated LFTs  - I independently visualized the following imaging with scope of interpretation limited to determining acute life threatening conditions related to emergency care: RUQ US , which revealed common bile duct dilation but no gallstones or evidence of cholecystitis.  Left shoulder x-ray showing osteoarthritis in the joint.  Shared these findings with the patient  Patient Reassessment and Ultimate Disposition/Management Discussed patient with [facility] NP Mai Schwalbe.  Recommended MRCP and admission to medicine.  They also stated that they we will see her in consult.  On reassessment she remains well-appearing, no acute distress and hemodynamically stable.  Weaknesses nonfocal in setting of vomiting, have low suspicion for stroke.  Signed out patient to oncoming ED resident Gordon Latus, MD who will plan to admit to medicine for further management.  Patient management required discussion with the following services or consulting groups:  Gastroenterology  Complexity of Problems Addressed Acute complicated illness or Injury  Additional Data Reviewed and Analyzed Further history obtained from: Past medical history and medications listed in the EMR and Prior ED visit notes  Patient Encounter Risk Assessment Consideration of hospitalization         Final Clinical Impression(s) / ED Diagnoses Final diagnoses:  Transaminitis  Common bile duct dilatation    Rx / DC Orders ED Discharge  Orders     None         Janalee Mcmurray, PA-C 12/18/23 1547    Almond Army, MD 12/21/23 0800
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
## 2023-12-18 NOTE — ED Provider Notes (Signed)
 Assume Care - Medical Decision Making  Care of patient assumed from previous emergency medicine provider. See their note for further details of history, physical exam and plan.  Briefly, Caroline Snyder is a 83 y.o. female who presents as below:  Clinical Course as of 12/18/23 1601  Sun Dec 18, 2023  1514 S; generalized weakness, N/V started on Monday; mild upper abd pain; GI called, Mai Schwalbe; admit to medicine  [WC]    Clinical Course User Index [WC] Arminda Landmark, MD     Reassessment:  I personally reassessed the patient: Vital Signs:  The most current vitals were:  Vitals:   12/18/23 1350 12/18/23 1415  BP: (!) 217/89 (!) 170/77  Pulse: 62 60  Resp: (!) 25 13  Temp:    SpO2: 100% 100%     Additional MDM/ED Course: Patient will be admitted to medicine.  GI was contacted and have ordered MRCP.  No concern for antibiotics at this time.  Discussed with hospitalist.   Arminda Landmark, MD 12/18/23 1601    Early Glisson, MD 12/18/23 [phone]

## 2023-12-18 NOTE — ED Provider Triage Note (Signed)
 Emergency Medicine Provider Triage Evaluation Note  Caroline Snyder , a 83 y.o. female  was evaluated in triage.  Pt complains of weakness and decreased appetite.  Pt has had nausea and vomiting.   Review of Systems  Positive: Nausea  Negative: fever  Physical Exam  BP (!) 189/85   Pulse 62   Temp 97.7 F (36.5 C)   Resp 16   Ht 5' (1.524 m)   Wt 44.5 kg   SpO2 99%   BMI 19.14 kg/m  Gen:   Awake, no distress   Resp:  Normal effort  MSK:   Moves extremities without difficulty  Other:    Medical Decision Making  Medically screening exam initiated at 12:50 PM.  Appropriate orders placed.  Caroline Snyder was informed that the remainder of the evaluation will be completed by another provider, this initial triage assessment does not replace that evaluation, and the importance of remaining in the ED until their evaluation is complete.     Sandi Crosby, PA-C 12/18/23 1252

## 2023-12-18 NOTE — Plan of Care (Signed)
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
## 2023-12-18 NOTE — Progress Notes (Signed)
 Patient transported to MRI

## 2023-12-19 DIAGNOSIS — R1310 Dysphagia, unspecified: Secondary | ICD-10-CM | POA: Diagnosis not present

## 2023-12-19 DIAGNOSIS — K219 Gastro-esophageal reflux disease without esophagitis: Secondary | ICD-10-CM | POA: Diagnosis not present

## 2023-12-19 DIAGNOSIS — R1319 Other dysphagia: Secondary | ICD-10-CM

## 2023-12-19 DIAGNOSIS — R112 Nausea with vomiting, unspecified: Secondary | ICD-10-CM | POA: Diagnosis not present

## 2023-12-19 DIAGNOSIS — R932 Abnormal findings on diagnostic imaging of liver and biliary tract: Secondary | ICD-10-CM

## 2023-12-19 DIAGNOSIS — K838 Other specified diseases of biliary tract: Secondary | ICD-10-CM | POA: Diagnosis not present

## 2023-12-19 DIAGNOSIS — R748 Abnormal levels of other serum enzymes: Secondary | ICD-10-CM

## 2023-12-19 LAB — CBC
HCT: 35.4 % — ABNORMAL LOW (ref 36.0–46.0)
Hemoglobin: 11.7 g/dL — ABNORMAL LOW (ref 12.0–15.0)
MCH: 30.2 pg (ref 26.0–34.0)
MCHC: 33.1 g/dL (ref 30.0–36.0)
MCV: 91.5 fL (ref 80.0–100.0)
Platelets: 243 10*3/uL (ref 150–400)
RBC: 3.87 MIL/uL (ref 3.87–5.11)
RDW: 13.2 % (ref 11.5–15.5)
WBC: 4.4 10*3/uL (ref 4.0–10.5)
nRBC: 0 % (ref 0.0–0.2)

## 2023-12-19 LAB — COMPREHENSIVE METABOLIC PANEL WITH GFR
ALT: 367 U/L — ABNORMAL HIGH (ref 0–44)
AST: 306 U/L — ABNORMAL HIGH (ref 15–41)
Albumin: 3.4 g/dL — ABNORMAL LOW (ref 3.5–5.0)
Alkaline Phosphatase: 315 U/L — ABNORMAL HIGH (ref 38–126)
Anion gap: 9 (ref 5–15)
BUN: 9 mg/dL (ref 8–23)
CO2: 20 mmol/L — ABNORMAL LOW (ref 22–32)
Calcium: 8.9 mg/dL (ref 8.9–10.3)
Chloride: 108 mmol/L (ref 98–111)
Creatinine, Ser: 0.86 mg/dL (ref 0.44–1.00)
GFR, Estimated: >60 mL/min (ref >60)
Glucose, Bld: 94 mg/dL (ref 70–99)
Potassium: 3.8 mmol/L (ref 3.5–5.1)
Sodium: 137 mmol/L (ref 135–145)
Total Bilirubin: 1.7 mg/dL — ABNORMAL HIGH (ref 0.0–1.2)
Total Protein: 6.2 g/dL — ABNORMAL LOW (ref 6.5–8.1)

## 2023-12-19 LAB — PROTIME-INR
INR: 1 (ref 0.8–1.2)
Prothrombin Time: 13 s (ref 11.4–15.2)

## 2023-12-19 LAB — URINE CULTURE: Culture: <10000 — AB

## 2023-12-19 MED ORDER — PANTOPRAZOLE SODIUM 40 MG IV SOLR
INTRAVENOUS | Status: AC
  Filled 2023-12-19: qty 10

## 2023-12-19 MED ORDER — ACETAMINOPHEN 325 MG PO TABS
650.0000 mg | ORAL_TABLET | Freq: Four times a day (QID) | ORAL | Status: DC | PRN
  Administered 2023-12-19 – 2023-12-22 (×6): 650 mg via ORAL
  Filled 2023-12-19 (×7): qty 2

## 2023-12-19 MED ORDER — SODIUM CHLORIDE 0.9 % IV SOLN: INTRAVENOUS | Status: AC

## 2023-12-19 MED ORDER — PANTOPRAZOLE SODIUM 40 MG IV SOLR
40.0000 mg | INTRAVENOUS | Status: DC
  Administered 2023-12-19 – 2023-12-22 (×4): 40 mg via INTRAVENOUS
  Filled 2023-12-19 (×3): qty 10

## 2023-12-19 NOTE — Progress Notes (Signed)
 Patient ID: Caroline Snyder, female   DOB: Aug 22, 1940, 84 y.o.   MRN: [id number]    Progress Note   Subjective   Day # 2 CC; nausea, vomiting, poor appetite, dysphagia, elevated LFTs  MRI/MRCP-CBD 1.2 cm moderate to severe intrahepatic ductal dilation, no signs of choledocholithiasis, abrupt beak like narrowing of the mid common bile duct at the level of the head of the pancreas, mild diffuse edema within the pancreatic parenchyma, main pancreatic duct up to 4 mm at the pancreatic neck which abruptly decreases in caliber of the head of the pancreas, there is also a focal area of mild increased T2 signal with restricted diffusion in the anterior head of the pancreas 2.2 x 1.2 x 2.2 cm, cannot exclude pancreatic head neoplasm  Labs-INR 1.0/pro time 13 WBC 4.4/hemoglobin 11.7/hematocrit 35.4/platelets 243 Potassium 3.8/BUN 9/creatinine 0.86 T. bili 1.7/alk phos 315/AST 306/ALT 367 CA 19-9 pending  Patient was up walking in the hallway with her son, continues to feel very poorly and her son offers much of the history He relates that she has been complaining of difficulty swallowing and a feeling as if her food has been sticking or sitting in her esophagus, she has not been regurgitating and does not appear to have a sensation of anything being stuck in her esophagus, She is able to swallow her saliva and has been able to take some liquids though does not have an appetite.  Continue complain of upper abdominal discomfort.   Objective   Vital signs in last 24 hours: Temp:  [97.6 F (36.4 C)-100 F (37.8 C)] 98.3 F (36.8 C) (05/19 0754) Pulse Rate:  [60-70] 70 (05/19 0754) Resp:  [13-25] 19 (05/19 0754) BP: (107-217)/(59-89) 155/72 (05/19 0754) SpO2:  [97 %-100 %] 98 % (05/19 0754) Weight:  [44.5 kg] 44.5 kg (05/18 1248)   General: Elderly, petite frail white female in NAD Heart:  Regular rate and rhythm; no murmurs Lungs: Respirations even and unlabored, lungs CTA bilaterally Abdomen:   Soft, there is tenderness in the upper abdomen, no guarding or rebound, no palpable mass or hepatosplenomegaly normal bowel sounds. Extremities:  Without edema. Neurologic:  Alert and oriented,  grossly normal neurologically. Psych:  Cooperative. Normal mood and affect.  Intake/Output from previous day: 05/18 0701 - 05/19 0700 In: 662.4 [I.V.:662.4] Out: -  Intake/Output this shift: Total I/O In: 120 [P.O.:120] Out: -   Lab Results: Recent Labs    12/18/23 1255 12/19/23 0811  WBC 5.3 4.4  HGB 12.2 11.7*  HCT 37.4 35.4*  PLT 260 243   BMET Recent Labs    12/18/23 1255 12/19/23 0811  NA 137 137  K 3.9 3.8  CL 103 108  CO2 24 20*  GLUCOSE 105* 94  BUN 17 9  CREATININE 0.98 0.86  CALCIUM  9.3 8.9   LFT Recent Labs    12/19/23 0811  PROT 6.2*  ALBUMIN 3.4*  AST 306*  ALT 367*  ALKPHOS 315*  BILITOT 1.7*   PT/INR Recent Labs    12/19/23 0811  LABPROT 13.0  INR 1.0    Studies/Results: MR ABDOMEN MRCP W WO CONTAST Result Date: 12/19/2023 CLINICAL DATA:  Evaluate etiology of biliary obstruction. Dilated common bile duct measuring 9 mm. EXAM: MRI ABDOMEN WITHOUT AND WITH CONTRAST (INCLUDING MRCP) TECHNIQUE: Multiplanar multisequence MR imaging of the abdomen was performed both before and after the administration of intravenous contrast. Heavily T2-weighted images of the biliary and pancreatic ducts were obtained, and three-dimensional MRCP images were rendered by post  processing. CONTRAST:  5mL GADAVIST  GADOBUTROL  1 MMOL/ML IV SOLN COMPARISON:  Right upper quadrant sonogram 12/18/2023 FINDINGS: Lower chest: No acute findings. Hepatobiliary: No focal enhancing liver lesions identified. The gallbladder is decompressed. No pericholecystic inflammatory change. The common bile duct is dilated measuring 1.2 cm. Moderate to severe intrahepatic bile duct dilatation. No signs of choledocholithiasis. There is abrupt beak like narrowing of the mid common bile duct at the level of  the head of pancreas, image 8/4. Pancreas: Mild diffuse edema within the pancreatic parenchyma noted. Mild increase caliber of the main pancreatic duct which measures up to 4 mm at the level of the pancreatic neck, which abruptly decreases in caliber at the head of pancreas. Focal area mild increased T2 signal with restricted diffusion in the anterior head of pancreas measuring 2.2 x 1.2 by 2.2 cm, image 75/6 and image 19/3. There are areas of restricted diffusion identified within the head of pancreas Spleen:  Within normal limits in size and appearance. Adrenals/Urinary Tract: No masses identified. No evidence of hydronephrosis. Stomach/Bowel: Visualized portions within the abdomen are unremarkable. Vascular/Lymphatic: Aortic atherosclerosis. No adenopathy identified. Other:  No ascites or focal fluid collections. Musculoskeletal: No suspicious bone lesions identified. IMPRESSION: 1. The gallbladder is now decompressed. No signs of choledocholithiasis. 2. Moderate to severe intrahepatic bile duct dilatation. There is abrupt beak like narrowing of the mid common bile duct at the level of the head of pancreas. Findings are concerning for a stricture at the level of the head of pancreas. 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 3. Mild diffuse edema within the pancreatic parenchyma noted, which is concerning for pancreatitis. No signs of pancreatic necrosis or pseudocyst formation. 4. Mild increase caliber of the main pancreatic duct which measures up to 4 mm at the level of the pancreatic neck, which abruptly decreases in caliber at the head of pancreas. Focal area of mild increased T2 signal with restricted diffusion in the anterior head of pancreas measures 2.2 x 1.2 by 2.2 cm. Cannot exclude pancreatic head neoplasm. Consider further evaluation with endoscopic ultrasound and tissue sampling. 5.  Aortic Atherosclerosis (ICD10-I70.0). Electronically Signed   By: Kimberley Penman M.D.   On: 12/19/2023 05:18   MR 3D Recon At Scanner Result Date: 12/19/2023 CLINICAL DATA:  Evaluate etiology of biliary obstruction. Dilated common bile duct measuring 9 mm. EXAM: MRI ABDOMEN WITHOUT AND WITH CONTRAST (INCLUDING MRCP) TECHNIQUE: Multiplanar multisequence MR imaging of the abdomen was performed both before and after the administration of intravenous contrast. Heavily T2-weighted images of the biliary and pancreatic ducts were obtained, and three-dimensional MRCP images were rendered by post processing. CONTRAST:  5mL GADAVIST  GADOBUTROL  1 MMOL/ML IV SOLN COMPARISON:  Right upper quadrant sonogram 12/18/2023 FINDINGS: Lower chest: No acute findings. Hepatobiliary: No focal enhancing liver lesions identified. The gallbladder is decompressed. No pericholecystic inflammatory change. The common bile duct is dilated measuring 1.2 cm. Moderate to severe intrahepatic bile duct dilatation. No signs of choledocholithiasis. There is abrupt beak like narrowing of the mid common bile duct at the level of the head of pancreas, image 8/4. Pancreas: Mild diffuse edema within the pancreatic parenchyma noted. Mild increase caliber of the main pancreatic duct which measures up to 4 mm at the level of the pancreatic neck, which abruptly decreases in caliber at the head of pancreas. Focal area mild increased T2 signal with restricted diffusion in the anterior head of pancreas measuring  2.2 x 1.2 by 2.2 cm, image 75/6 and image 19/3. There are areas of restricted diffusion identified within the head of pancreas Spleen:  Within normal limits in size and appearance. Adrenals/Urinary Tract: No masses identified. No evidence of hydronephrosis. Stomach/Bowel: Visualized portions within the abdomen are unremarkable. Vascular/Lymphatic: Aortic atherosclerosis. No adenopathy identified. Other:  No ascites or focal fluid collections.  Musculoskeletal: No suspicious bone lesions identified. IMPRESSION: 1. The gallbladder is now decompressed. No signs of choledocholithiasis. 2. Moderate to severe intrahepatic bile duct dilatation. There is abrupt beak like narrowing of the mid common bile duct at the level of the head of pancreas. Findings are concerning for a stricture at the level of the head of pancreas. 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 3. Mild diffuse edema within the pancreatic parenchyma noted, which is concerning for pancreatitis. No signs of pancreatic necrosis or pseudocyst formation. 4. Mild increase caliber of the main pancreatic duct which measures up to 4 mm at the level of the pancreatic neck, which abruptly decreases in caliber at the head of pancreas. Focal area of mild increased T2 signal with restricted diffusion in the anterior head of pancreas measures 2.2 x 1.2 by 2.2 cm. Cannot exclude pancreatic head neoplasm. Consider further evaluation with endoscopic ultrasound and tissue sampling. 5. Aortic Atherosclerosis (ICD10-I70.0). Electronically Signed   By: Kimberley Penman M.D.   On: 12/19/2023 05:18   DG Shoulder Left Result Date: 12/18/2023 CLINICAL DATA:  Pain. EXAM: LEFT SHOULDER - 2+ VIEW COMPARISON:  None Available. FINDINGS: There is no evidence of fracture or dislocation. Minor acromioclavicular spurring. Glenohumeral joint space is preserved with trace inferior glenoid spurring. No erosions or suspicious bone lesions. Soft tissues are unremarkable. IMPRESSION: Minor acromioclavicular and glenohumeral osteoarthritis. Electronically Signed   By: Chadwick Colonel M.D.   On: 12/18/2023 15:21   US  Abdomen Limited RUQ (LIVER/GB) Result Date: 12/18/2023 CLINICAL DATA:  Right upper quadrant pain skip EXAM: ULTRASOUND ABDOMEN LIMITED RIGHT UPPER QUADRANT COMPARISON:  None Available. FINDINGS:  Gallbladder: No gallstones or wall thickening visualized. No sonographic Murphy sign noted by sonographer. Common bile duct: Diameter: 6 mm-9 mm in left lateral decubitus. No intrahepatic biliary ductal dilatation. Liver: No focal lesion identified. Within normal limits in parenchymal echogenicity. Portal vein is patent on color Doppler imaging with normal direction of blood flow towards the liver. Other: None. IMPRESSION: 1. No cholelithiasis or sonographic evidence for acute cholecystitis. 2. Common bile duct is mildly dilated measuring up to 9 mm. Recommend correlation with LFTs. This can be further evaluated with ERCP or MRCP. Electronically Signed   By: Tyron Gallon M.D.   On: 12/18/2023 15:05       Assessment / Plan:    #55 83 year old white female, with 1 week history of complaints of nausea and vomiting, poor appetite, upper abdominal pain with some radiation to the back, no documented fever or chills. Also with new complaint of dysphagia, patient does not offer much history but her son feels this is really just been an issue over the past few days.  MRI/MRCP done last night negative for choledocholithiasis, she does have intrahepatic ductal dilation, and abrupt beak like narrowing of the common bile duct at the level of the head of the pancreas.  There is also abnormal increased T2 signal in the anterior head of the pancreas and an area measuring 2.2 x 1.2 x 2.2 cm, cannot rule out  pancreatic head neoplasm.  #2 dementia #3.  GERD  Plan; clear liquid diet today, will keep n.p.o. after midnight Patient will need EGD/EUS/ERCP.  Procedures will be scheduled with Dr. Willistine Hartigan this can be coordinated for tomorrow/12/20/2023 and afternoon.  We discussed the procedures in detail with her son at bedside, including indications risks and benefits and he they are agreeable to proceed. Hold Lovenox  Follow-up CA 19-9 Repeat labs in a.m. GI will follow with you    Principal Problem:    Common bile duct dilation Active Problems:   Nausea and vomiting   Transaminitis     LOS: 1 day   Mayce Noyes PA-C 12/19/2023, 11:49 AM

## 2023-12-19 NOTE — Progress Notes (Signed)
 Progress Note   Patient: Caroline Snyder WGN:[id number] DOB: June 16, 1941 DOA: 12/18/2023     1 DOS: the patient was seen and examined on 12/19/2023   Brief hospital course: Caroline Snyder is a 83 y.o. female with medical history significant of HTN/HLD, migraines, and GERD p/w intractable n/v. Labs notable for AST/ALT 300s, and Alk Phos 315. RUQ US  with CBD dilation. Pt admitted to [facility] service with GI consultation.   Assessment and Plan: Epigastric abdominal pain, nausea, vomiting. Transaminitis due to biliary obstruction. MRCP showed dilated CBD (1.2 cm) and intrahepatic ductal dilation, mildly dilated PD with abrupt transition at head of pancreas. High suspicion for pancreatic cancer. GI evaluation appreciated. She is planned for EUS/ ERCP tomorrow. Continue clears, gentle IV hydration. Continue IV Zofran  as needed.  Hypertension: Blood pressure stable.  Continue home dose lisinopril, hydrochlorothiazide .  Hyperlipidemia: Continue Crestor  therapy.  GERD - IV PPI ordered.      Out of bed to chair. Incentive spirometry. Nursing supportive care. Fall, aspiration precautions. Diet:  Diet Orders (From admission, onward)     Start     Ordered   12/20/23 0001  Diet NPO time specified Except for: Sips with Meds  Diet effective midnight       Question:  Except for  Answer:  Sips with Meds   12/19/23 1158   12/19/23 1001  Diet clear liquid Room service appropriate? Yes; Fluid consistency: Thin  Diet effective now       Question Answer Comment  Room service appropriate? Yes   Fluid consistency: Thin      12/19/23 1000           DVT prophylaxis: enoxaparin  (LOVENOX ) injection 30 mg Start: 12/18/23 1600  Level of care: Med-Surg   Code Status: Full Code  Subjective: Patient is seen and examined today morning. She feels weak, able to get out with help. Has epigastric abdominal discomfort, nausea. Able to tolerate clears. Son at bedside.  Physical Exam: Vitals:    12/19/23 0039 12/19/23 0401 12/19/23 0754 12/19/23 1155  BP: 117/64 (!) 142/67 (!) 155/72 138/70  Pulse: 65 66 70 63  Resp: 16 18 19 18   Temp: 97.6 F (36.4 C) 97.9 F (36.6 C) 98.3 F (36.8 C) 97.8 F (36.6 C)  TempSrc: Oral Oral    SpO2: 97% 98% 98% 97%  Weight:      Height:        General - Elderly thin built Caucasian female, distress due to pain, nausea. HEENT - PERRLA, EOMI, atraumatic head, non tender sinuses. Lung - Clear, basal rales, no rhonchi, wheezes. Heart - S1, S2 heard, no murmurs, rubs, no pedal edema. Abdomen - Soft, epigastric tender, bowel sounds good Neuro - Alert, awake and oriented x 3, non focal exam. Skin - Warm and dry.  Data Reviewed:      Latest Ref Rng & Units 12/19/2023    8:11 AM 12/18/2023   12:55 PM 12/13/2023   10:42 AM  CBC  WBC 4.0 - 10.5 K/uL 4.4  5.3  6.3   Hemoglobin 12.0 - 15.0 g/dL 78.4  69.6  29.5   Hematocrit 36.0 - 46.0 % 35.4  37.4  38.9   Platelets 150 - 400 K/uL 243  260  312       Latest Ref Rng & Units 12/19/2023    8:11 AM 12/18/2023   12:55 PM 12/13/2023   10:42 AM  BMP  Glucose 70 - 99 mg/dL 94  284  85   BUN  8 - 23 mg/dL 9  17  18    Creatinine 0.44 - 1.00 mg/dL 1.61  0.96  0.45   BUN/Creat Ratio 6 - 22 (calc)   19   Sodium 135 - 145 mmol/L 137  137  142   Potassium 3.5 - 5.1 mmol/L 3.8  3.9  4.2   Chloride 98 - 111 mmol/L 108  103  106   CO2 22 - 32 mmol/L 20  24  28    Calcium  8.9 - 10.3 mg/dL 8.9  9.3  40.9    MR ABDOMEN MRCP W WO CONTAST Result Date: 12/19/2023 CLINICAL DATA:  Evaluate etiology of biliary obstruction. Dilated common bile duct measuring 9 mm. EXAM: MRI ABDOMEN WITHOUT AND WITH CONTRAST (INCLUDING MRCP) TECHNIQUE: Multiplanar multisequence MR imaging of the abdomen was performed both before and after the administration of intravenous contrast. Heavily T2-weighted images of the biliary and pancreatic ducts were obtained, and three-dimensional MRCP images were rendered by post processing. CONTRAST:   5mL GADAVIST  GADOBUTROL  1 MMOL/ML IV SOLN COMPARISON:  Right upper quadrant sonogram 12/18/2023 FINDINGS: Lower chest: No acute findings. Hepatobiliary: No focal enhancing liver lesions identified. The gallbladder is decompressed. No pericholecystic inflammatory change. The common bile duct is dilated measuring 1.2 cm. Moderate to severe intrahepatic bile duct dilatation. No signs of choledocholithiasis. There is abrupt beak like narrowing of the mid common bile duct at the level of the head of pancreas, image 8/4. Pancreas: Mild diffuse edema within the pancreatic parenchyma noted. Mild increase caliber of the main pancreatic duct which measures up to 4 mm at the level of the pancreatic neck, which abruptly decreases in caliber at the head of pancreas. Focal area mild increased T2 signal with restricted diffusion in the anterior head of pancreas measuring 2.2 x 1.2 by 2.2 cm, image 75/6 and image 19/3. There are areas of restricted diffusion identified within the head of pancreas Spleen:  Within normal limits in size and appearance. Adrenals/Urinary Tract: No masses identified. No evidence of hydronephrosis. Stomach/Bowel: Visualized portions within the abdomen are unremarkable. Vascular/Lymphatic: Aortic atherosclerosis. No adenopathy identified. Other:  No ascites or focal fluid collections. Musculoskeletal: No suspicious bone lesions identified. IMPRESSION: 1. The gallbladder is now decompressed. No signs of choledocholithiasis. 2. Moderate to severe intrahepatic bile duct dilatation. There is abrupt beak like narrowing of the mid common bile duct at the level of the head of pancreas. Findings are concerning for a stricture at the level of the head of pancreas. 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 3. Mild diffuse edema within the pancreatic parenchyma noted, which is concerning for  pancreatitis. No signs of pancreatic necrosis or pseudocyst formation. 4. Mild increase caliber of the main pancreatic duct which measures up to 4 mm at the level of the pancreatic neck, which abruptly decreases in caliber at the head of pancreas. Focal area of mild increased T2 signal with restricted diffusion in the anterior head of pancreas measures 2.2 x 1.2 by 2.2 cm. Cannot exclude pancreatic head neoplasm. Consider further evaluation with endoscopic ultrasound and tissue sampling. 5. Aortic Atherosclerosis (ICD10-I70.0). Electronically Signed   By: Kimberley Penman M.D.   On: 12/19/2023 05:18   MR 3D Recon At Scanner Result Date: 12/19/2023 CLINICAL DATA:  Evaluate etiology of biliary obstruction. Dilated common bile duct measuring 9 mm. EXAM: MRI ABDOMEN WITHOUT AND WITH CONTRAST (INCLUDING MRCP) TECHNIQUE: Multiplanar multisequence MR imaging of the abdomen  was performed both before and after the administration of intravenous contrast. Heavily T2-weighted images of the biliary and pancreatic ducts were obtained, and three-dimensional MRCP images were rendered by post processing. CONTRAST:  5mL GADAVIST  GADOBUTROL  1 MMOL/ML IV SOLN COMPARISON:  Right upper quadrant sonogram 12/18/2023 FINDINGS: Lower chest: No acute findings. Hepatobiliary: No focal enhancing liver lesions identified. The gallbladder is decompressed. No pericholecystic inflammatory change. The common bile duct is dilated measuring 1.2 cm. Moderate to severe intrahepatic bile duct dilatation. No signs of choledocholithiasis. There is abrupt beak like narrowing of the mid common bile duct at the level of the head of pancreas, image 8/4. Pancreas: Mild diffuse edema within the pancreatic parenchyma noted. Mild increase caliber of the main pancreatic duct which measures up to 4 mm at the level of the pancreatic neck, which abruptly decreases in caliber at the head of pancreas. Focal area mild increased T2 signal with restricted diffusion in the  anterior head of pancreas measuring 2.2 x 1.2 by 2.2 cm, image 75/6 and image 19/3. There are areas of restricted diffusion identified within the head of pancreas Spleen:  Within normal limits in size and appearance. Adrenals/Urinary Tract: No masses identified. No evidence of hydronephrosis. Stomach/Bowel: Visualized portions within the abdomen are unremarkable. Vascular/Lymphatic: Aortic atherosclerosis. No adenopathy identified. Other:  No ascites or focal fluid collections. Musculoskeletal: No suspicious bone lesions identified. IMPRESSION: 1. The gallbladder is now decompressed. No signs of choledocholithiasis. 2. Moderate to severe intrahepatic bile duct dilatation. There is abrupt beak like narrowing of the mid common bile duct at the level of the head of pancreas. Findings are concerning for a stricture at the level of the head of pancreas. 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 3. Mild diffuse edema within the pancreatic parenchyma noted, which is concerning for pancreatitis. No signs of pancreatic necrosis or pseudocyst formation. 4. Mild increase caliber of the main pancreatic duct which measures up to 4 mm at the level of the pancreatic neck, which abruptly decreases in caliber at the head of pancreas. Focal area of mild increased T2 signal with restricted diffusion in the anterior head of pancreas measures 2.2 x 1.2 by 2.2 cm. Cannot exclude pancreatic head neoplasm. Consider further evaluation with endoscopic ultrasound and tissue sampling. 5. Aortic Atherosclerosis (ICD10-I70.0). Electronically Signed   By: Kimberley Penman M.D.   On: 12/19/2023 05:18   DG Shoulder Left Result Date: 12/18/2023 CLINICAL DATA:  Pain. EXAM: LEFT SHOULDER - 2+ VIEW COMPARISON:  None Available. FINDINGS: There is no evidence of fracture or dislocation. Minor acromioclavicular spurring. Glenohumeral joint  space is preserved with trace inferior glenoid spurring. No erosions or suspicious bone lesions. Soft tissues are unremarkable. IMPRESSION: Minor acromioclavicular and glenohumeral osteoarthritis. Electronically Signed   By: Chadwick Colonel M.D.   On: 12/18/2023 15:21   US  Abdomen Limited RUQ (LIVER/GB) Result Date: 12/18/2023 CLINICAL DATA:  Right upper quadrant pain skip EXAM: ULTRASOUND ABDOMEN LIMITED RIGHT UPPER QUADRANT COMPARISON:  None Available. FINDINGS: Gallbladder: No gallstones or wall thickening visualized. No sonographic Murphy sign noted by sonographer. Common bile duct: Diameter: 6 mm-9 mm in left lateral decubitus. No intrahepatic biliary ductal dilatation. Liver: No focal lesion identified. Within normal limits in parenchymal echogenicity. Portal vein is patent on color Doppler imaging with normal direction of blood flow towards the liver. Other: None. IMPRESSION: 1. No cholelithiasis or sonographic evidence for acute cholecystitis. 2. Common bile duct  is mildly dilated measuring up to 9 mm. Recommend correlation with LFTs. This can be further evaluated with ERCP or MRCP. Electronically Signed   By: Tyron Gallon M.D.   On: 12/18/2023 15:05    Family Communication: Discussed with patient, son at beside. They understand and agree. All questions answered.  Disposition: Status is: Inpatient Remains inpatient appropriate because: EUS/ ERCP for tomorrow  Planned Discharge Destination: Home with Home Health     Time spent: 41 minutes  Author: Aisha Hove, MD 12/19/2023 2:59 PM Secure chat 7am to 7pm For on call review [email or web address].

## 2023-12-19 NOTE — Plan of Care (Signed)

## 2023-12-20 DIAGNOSIS — M549 Dorsalgia, unspecified: Secondary | ICD-10-CM | POA: Diagnosis not present

## 2023-12-20 DIAGNOSIS — R748 Abnormal levels of other serum enzymes: Secondary | ICD-10-CM | POA: Diagnosis not present

## 2023-12-20 DIAGNOSIS — K838 Other specified diseases of biliary tract: Secondary | ICD-10-CM | POA: Diagnosis not present

## 2023-12-20 DIAGNOSIS — R932 Abnormal findings on diagnostic imaging of liver and biliary tract: Secondary | ICD-10-CM | POA: Diagnosis not present

## 2023-12-20 DIAGNOSIS — K219 Gastro-esophageal reflux disease without esophagitis: Secondary | ICD-10-CM | POA: Diagnosis not present

## 2023-12-20 DIAGNOSIS — R1310 Dysphagia, unspecified: Secondary | ICD-10-CM | POA: Diagnosis not present

## 2023-12-20 DIAGNOSIS — R1319 Other dysphagia: Secondary | ICD-10-CM | POA: Diagnosis not present

## 2023-12-20 DIAGNOSIS — R112 Nausea with vomiting, unspecified: Secondary | ICD-10-CM | POA: Diagnosis not present

## 2023-12-20 LAB — CBC
HCT: 33.5 % — ABNORMAL LOW (ref 36.0–46.0)
Hemoglobin: 11.2 g/dL — ABNORMAL LOW (ref 12.0–15.0)
MCH: 30.8 pg (ref 26.0–34.0)
MCHC: 33.4 g/dL (ref 30.0–36.0)
MCV: 92 fL (ref 80.0–100.0)
Platelets: 228 10*3/uL (ref 150–400)
RBC: 3.64 MIL/uL — ABNORMAL LOW (ref 3.87–5.11)
RDW: 13.2 % (ref 11.5–15.5)
WBC: 4 10*3/uL (ref 4.0–10.5)
nRBC: 0 % (ref 0.0–0.2)

## 2023-12-20 LAB — COMPREHENSIVE METABOLIC PANEL WITH GFR
ALT: 316 U/L — ABNORMAL HIGH (ref 0–44)
AST: 280 U/L — ABNORMAL HIGH (ref 15–41)
Albumin: 3.1 g/dL — ABNORMAL LOW (ref 3.5–5.0)
Alkaline Phosphatase: 284 U/L — ABNORMAL HIGH (ref 38–126)
Anion gap: 7 (ref 5–15)
BUN: 8 mg/dL (ref 8–23)
CO2: 24 mmol/L (ref 22–32)
Calcium: 8.7 mg/dL — ABNORMAL LOW (ref 8.9–10.3)
Chloride: 109 mmol/L (ref 98–111)
Creatinine, Ser: 0.91 mg/dL (ref 0.44–1.00)
GFR, Estimated: >60 mL/min (ref >60)
Glucose, Bld: 94 mg/dL (ref 70–99)
Potassium: 3.9 mmol/L (ref 3.5–5.1)
Sodium: 140 mmol/L (ref 135–145)
Total Bilirubin: 2.6 mg/dL — ABNORMAL HIGH (ref 0.0–1.2)
Total Protein: 5.7 g/dL — ABNORMAL LOW (ref 6.5–8.1)

## 2023-12-20 LAB — CANCER ANTIGEN 19-9: CA 19-9: 180 U/mL — ABNORMAL HIGH (ref 0–35)

## 2023-12-20 MED ORDER — DEXTROSE-SODIUM CHLORIDE 5-0.9 % IV SOLN: INTRAVENOUS | Status: AC

## 2023-12-20 NOTE — Plan of Care (Signed)

## 2023-12-20 NOTE — H&P (View-Only) (Signed)
 Patient ID: Caroline Snyder, female   DOB: 1941-05-03, 83 y.o.   MRN: [id number]    Progress Note   Subjective   Day # 3 CC; nausea, vomiting, dysphagia, elevated LFTs  Patient feels about the same today, still no appetite, she has not had any vomiting, no complaints of abdominal pain today primary complaint is of mid back pain.  She has been up ambulating with her son.  Patient was scheduled for EUS and ERCP today, procedure has been bumped and will be rescheduled for tomorrow.  Labs today-WBC 4.0/hemoglobin 11.2/hematocrit 33.5 Potassium 3.9/BUN 8/creatinine 0.91 T. bili 2.6/alk phos 284/AST 280/ALT 316  MRI/MRCP 12/18/2023 shows a dilated common bile duct to 1.2 cm with moderate to severe intrahepatic ductal dilation and an abrupt beak like narrowing of the mid common bile duct at the level of the head of the pancreas, possible pancreatic head mass measuring 2.2 x 1.2 x 2.2 cm, with mild increased caliber of the main pancreatic duct measuring up to 4 mm at the level of the pancreatic neck Findings concerning for stricture at the level of the head of the pancreas, rule out underlying neoplasm.    Objective   Vital signs in last 24 hours: Temp:  [97.8 F (36.6 C)-98.5 F (36.9 C)] 97.8 F (36.6 C) (05/20 1227) Pulse Rate:  [60-65] 63 (05/20 1227) Resp:  [16-18] 17 (05/20 1227) BP: (128-151)/(57-75) 146/74 (05/20 1227) SpO2:  [96 %-98 %] 98 % (05/20 1227) Last BM Date : 12/16/23 General:   Elderly frail-appearing white female in NAD Heart:  Regular rate and rhythm; no murmurs Lungs: Respirations even and unlabored, lungs CTA bilaterally Abdomen:  Soft, nontender and nondistended. Normal bowel sounds. Extremities:  Without edema. Neurologic:  Alert and oriented,  grossly normal neurologically. Psych:  Cooperative. Normal mood and affect.  Intake/Output from previous day: 05/19 0701 - 05/20 0700 In: 836.7 [P.O.:120; I.V.:716.7] Out: -  Intake/Output this shift: Total  I/O In: 120 [P.O.:120] Out: -   Lab Results: Recent Labs    12/18/23 1255 12/19/23 0811 12/20/23 0857  WBC 5.3 4.4 4.0  HGB 12.2 11.7* 11.2*  HCT 37.4 35.4* 33.5*  PLT 260 243 228   BMET Recent Labs    12/18/23 1255 12/19/23 0811 12/20/23 0857  NA 137 137 140  K 3.9 3.8 3.9  CL 103 108 109  CO2 24 20* 24  GLUCOSE 105* 94 94  BUN 17 9 8   CREATININE 0.98 0.86 0.91  CALCIUM  9.3 8.9 8.7*   LFT Recent Labs    12/20/23 0857  PROT 5.7*  ALBUMIN 3.1*  AST 280*  ALT 316*  ALKPHOS 284*  BILITOT 2.6*   PT/INR Recent Labs    12/19/23 0811  LABPROT 13.0  INR 1.0    Studies/Results: MR ABDOMEN MRCP W WO CONTAST Result Date: 12/19/2023 CLINICAL DATA:  Evaluate etiology of biliary obstruction. Dilated common bile duct measuring 9 mm. EXAM: MRI ABDOMEN WITHOUT AND WITH CONTRAST (INCLUDING MRCP) TECHNIQUE: Multiplanar multisequence MR imaging of the abdomen was performed both before and after the administration of intravenous contrast. Heavily T2-weighted images of the biliary and pancreatic ducts were obtained, and three-dimensional MRCP images were rendered by post processing. CONTRAST:  5mL GADAVIST  GADOBUTROL  1 MMOL/ML IV SOLN COMPARISON:  Right upper quadrant sonogram 12/18/2023 FINDINGS: Lower chest: No acute findings. Hepatobiliary: No focal enhancing liver lesions identified. The gallbladder is decompressed. No pericholecystic inflammatory change. The common bile duct is dilated measuring 1.2 cm. Moderate to severe intrahepatic bile duct  dilatation. No signs of choledocholithiasis. There is abrupt beak like narrowing of the mid common bile duct at the level of the head of pancreas, image 8/4. Pancreas: Mild diffuse edema within the pancreatic parenchyma noted. Mild increase caliber of the main pancreatic duct which measures up to 4 mm at the level of the pancreatic neck, which abruptly decreases in caliber at the head of pancreas. Focal area mild increased T2 signal with  restricted diffusion in the anterior head of pancreas measuring 2.2 x 1.2 by 2.2 cm, image 75/6 and image 19/3. There are areas of restricted diffusion identified within the head of pancreas Spleen:  Within normal limits in size and appearance. Adrenals/Urinary Tract: No masses identified. No evidence of hydronephrosis. Stomach/Bowel: Visualized portions within the abdomen are unremarkable. Vascular/Lymphatic: Aortic atherosclerosis. No adenopathy identified. Other:  No ascites or focal fluid collections. Musculoskeletal: No suspicious bone lesions identified. IMPRESSION: 1. The gallbladder is now decompressed. No signs of choledocholithiasis. 2. Moderate to severe intrahepatic bile duct dilatation. There is abrupt beak like narrowing of the mid common bile duct at the level of the head of pancreas. Findings are concerning for a stricture at the level of the head of pancreas. 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 3. Mild diffuse edema within the pancreatic parenchyma noted, which is concerning for pancreatitis. No signs of pancreatic necrosis or pseudocyst formation. 4. Mild increase caliber of the main pancreatic duct which measures up to 4 mm at the level of the pancreatic neck, which abruptly decreases in caliber at the head of pancreas. Focal area of mild increased T2 signal with restricted diffusion in the anterior head of pancreas measures 2.2 x 1.2 by 2.2 cm. Cannot exclude pancreatic head neoplasm. Consider further evaluation with endoscopic ultrasound and tissue sampling. 5. Aortic Atherosclerosis (ICD10-I70.0). Electronically Signed   By: Kimberley Penman M.D.   On: 12/19/2023 05:18   MR 3D Recon At Scanner Result Date: 12/19/2023 CLINICAL DATA:  Evaluate etiology of biliary obstruction. Dilated common bile duct measuring 9 mm. EXAM: MRI ABDOMEN WITHOUT AND WITH CONTRAST (INCLUDING  MRCP) TECHNIQUE: Multiplanar multisequence MR imaging of the abdomen was performed both before and after the administration of intravenous contrast. Heavily T2-weighted images of the biliary and pancreatic ducts were obtained, and three-dimensional MRCP images were rendered by post processing. CONTRAST:  5mL GADAVIST  GADOBUTROL  1 MMOL/ML IV SOLN COMPARISON:  Right upper quadrant sonogram 12/18/2023 FINDINGS: Lower chest: No acute findings. Hepatobiliary: No focal enhancing liver lesions identified. The gallbladder is decompressed. No pericholecystic inflammatory change. The common bile duct is dilated measuring 1.2 cm. Moderate to severe intrahepatic bile duct dilatation. No signs of choledocholithiasis. There is abrupt beak like narrowing of the mid common bile duct at the level of the head of pancreas, image 8/4. Pancreas: Mild diffuse edema within the pancreatic parenchyma noted. Mild increase caliber of the main pancreatic duct which measures up to 4 mm at the level of the pancreatic neck, which abruptly decreases in caliber at the head of pancreas. Focal area mild increased T2 signal with restricted diffusion in the anterior head of pancreas measuring 2.2 x 1.2 by 2.2 cm, image 75/6 and image 19/3. There are areas of restricted diffusion identified within the head of pancreas Spleen:  Within normal limits in size and appearance. Adrenals/Urinary Tract: No masses identified. No evidence of hydronephrosis. Stomach/Bowel: Visualized portions within the abdomen are unremarkable. Vascular/Lymphatic: Aortic atherosclerosis. No  adenopathy identified. Other:  No ascites or focal fluid collections. Musculoskeletal: No suspicious bone lesions identified. IMPRESSION: 1. The gallbladder is now decompressed. No signs of choledocholithiasis. 2. Moderate to severe intrahepatic bile duct dilatation. There is abrupt beak like narrowing of the mid common bile duct at the level of the head of pancreas. Findings are concerning for  a stricture at the level of the head of pancreas. 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 3. Mild diffuse edema within the pancreatic parenchyma noted, which is concerning for pancreatitis. No signs of pancreatic necrosis or pseudocyst formation. 4. Mild increase caliber of the main pancreatic duct which measures up to 4 mm at the level of the pancreatic neck, which abruptly decreases in caliber at the head of pancreas. Focal area of mild increased T2 signal with restricted diffusion in the anterior head of pancreas measures 2.2 x 1.2 by 2.2 cm. Cannot exclude pancreatic head neoplasm. Consider further evaluation with endoscopic ultrasound and tissue sampling. 5. Aortic Atherosclerosis (ICD10-I70.0). Electronically Signed   By: Kimberley Penman M.D.   On: 12/19/2023 05:18   DG Shoulder Left Result Date: 12/18/2023 CLINICAL DATA:  Pain. EXAM: LEFT SHOULDER - 2+ VIEW COMPARISON:  None Available. FINDINGS: There is no evidence of fracture or dislocation. Minor acromioclavicular spurring. Glenohumeral joint space is preserved with trace inferior glenoid spurring. No erosions or suspicious bone lesions. Soft tissues are unremarkable. IMPRESSION: Minor acromioclavicular and glenohumeral osteoarthritis. Electronically Signed   By: Chadwick Colonel M.D.   On: 12/18/2023 15:21   US  Abdomen Limited RUQ (LIVER/GB) Result Date: 12/18/2023 CLINICAL DATA:  Right upper quadrant pain skip EXAM: ULTRASOUND ABDOMEN LIMITED RIGHT UPPER QUADRANT COMPARISON:  None Available. FINDINGS: Gallbladder: No gallstones or wall thickening visualized. No sonographic Murphy sign noted by sonographer. Common bile duct: Diameter: 6 mm-9 mm in left lateral decubitus. No intrahepatic biliary ductal dilatation. Liver: No focal lesion identified. Within normal limits in parenchymal echogenicity. Portal vein is patent on color  Doppler imaging with normal direction of blood flow towards the liver. Other: None. IMPRESSION: 1. No cholelithiasis or sonographic evidence for acute cholecystitis. 2. Common bile duct is mildly dilated measuring up to 9 mm. Recommend correlation with LFTs. This can be further evaluated with ERCP or MRCP. Electronically Signed   By: Tyron Gallon M.D.   On: 12/18/2023 15:05       Assessment / Plan:    #67  83 year old female admitted with 1 week history of nausea, vomiting poor intake complaints of upper abdominal pain with some radiation to the back and also with new complaint of dysphagia only present over the past few days.  Initial labs revealed elevated LFTs and subsequent MRI/MRCP shows a mid common bile duct stricture at the level of the head of the pancreas, there is also abnormal increased T2 signal in the anterior head of the pancreas and an area measuring 2.2 x 1.2 x 2.2 cm cannot rule out pancreatic head neoplasm.  LFTs have gradually been on the rise CA 19-9 pending  #2 back pain-probably musculoskeletal start heating pad, only using Tylenol  for pain control #3 dementia #4.  GERD  Plan; full liquid diet, n.p.o. past midnight Pt scheduled/for EGD, EUS and ERCP with Dr. Brice Campi  procedure to be rescheduled for tomorrow at 1 PM, family aware this was a good possibility. Continue to hold Lovenox  Repeat labs in a.m. GI will continue to follow with  you    Principal Problem:   Common bile duct dilation Active Problems:   Nausea and vomiting   Transaminitis     LOS: 2 days   Diontre Harps PA-C 12/20/2023, 2:00 PM

## 2023-12-20 NOTE — Progress Notes (Signed)
 Progress Note   Patient: Caroline Snyder ZOX:[id number] DOB: 12-23-1940 DOA: 12/18/2023     2 DOS: the patient was seen and examined on 12/20/2023   Brief hospital course: Caroline Snyder is a 83 y.o. female with medical history significant of HTN/HLD, migraines, and GERD p/w intractable n/v. Labs notable for AST/ALT 300s, and Alk Phos 315. RUQ US  with CBD dilation. Pt admitted to [facility] service with GI consultation.   Assessment and Plan: Epigastric abdominal pain, nausea, vomiting. Transaminitis due to biliary obstruction. MRCP showed dilated CBD (1.2 cm) and intrahepatic ductal dilation, mildly dilated PD with abrupt transition at head of pancreas. High suspicion for pancreatic cancer. GI follow up appreciated. CA 19-9 pending, LFT high but stable. Due to unavailability of OR, EUS/ ERCP postponed to tomorrow. Continue clears, gentle IV hydration. NPO past midnight. Continue IV Zofran  as needed.  Hypertension: Blood pressure stable.  Continue home dose lisinopril, hydrochlorothiazide .  Hyperlipidemia: Continue Crestor  therapy.  GERD - continue IV PPI      Out of bed to chair. Incentive spirometry. Nursing supportive care. Fall, aspiration precautions. Diet:  Diet Orders (From admission, onward)     Start     Ordered   12/21/23 0500  Diet NPO time specified Except for: Sips with Meds  Diet effective 0500 tomorrow       Question:  Except for  Answer:  Caroline Snyder with Meds   12/20/23 1358   12/20/23 1358  Diet full liquid Room service appropriate? Yes with Assist; Fluid consistency: Thin  Diet effective now       Question Answer Comment  Room service appropriate? Yes with Assist   Fluid consistency: Thin      12/20/23 1357           DVT prophylaxis: enoxaparin  (LOVENOX ) injection 30 mg Start: 12/18/23 1600  Level of care: Med-Surg   Code Status: Full Code  Subjective: Patient is seen and examined today morning. She feels weak, has nausea. Abdominal pain better. Able  to tolerate clears. No family at bedside.  Physical Exam: Vitals:   12/20/23 0529 12/20/23 0752 12/20/23 1100 12/20/23 1227  BP: (!) 128/57 138/65 (!) 146/74 (!) 146/74  Pulse: 61 60 63 63  Resp: 16 17 17 17   Temp: 98.2 F (36.8 C) 98.1 F (36.7 C) 97.8 F (36.6 C) 97.8 F (36.6 C)  TempSrc: Oral  Oral   SpO2: 97% 96% 98% 98%  Weight:      Height:        General - Elderly thin built Caucasian female, distress due to nausea. HEENT - PERRLA, EOMI, atraumatic head, non tender sinuses. Lung - Clear, basal rales, no rhonchi, wheezes. Heart - S1, S2 heard, no murmurs, rubs, no pedal edema. Abdomen - Soft, epigastric tender, bowel sounds good Neuro - Alert, awake and oriented x 3, non focal exam. Skin - Warm and dry.  Data Reviewed:      Latest Ref Rng & Units 12/20/2023    8:57 AM 12/19/2023    8:11 AM 12/18/2023   12:55 PM  CBC  WBC 4.0 - 10.5 K/uL 4.0  4.4  5.3   Hemoglobin 12.0 - 15.0 g/dL 81.1  91.4  78.2   Hematocrit 36.0 - 46.0 % 33.5  35.4  37.4   Platelets 150 - 400 K/uL 228  243  260       Latest Ref Rng & Units 12/20/2023    8:57 AM 12/19/2023    8:11 AM 12/18/2023   12:55 PM  BMP  Glucose 70 - 99 mg/dL 94  94  161   BUN 8 - 23 mg/dL 8  9  17    Creatinine 0.44 - 1.00 mg/dL 0.96  0.45  4.09   Sodium 135 - 145 mmol/L 140  137  137   Potassium 3.5 - 5.1 mmol/L 3.9  3.8  3.9   Chloride 98 - 111 mmol/L 109  108  103   CO2 22 - 32 mmol/L 24  20  24    Calcium  8.9 - 10.3 mg/dL 8.7  8.9  9.3    MR ABDOMEN MRCP W WO CONTAST Result Date: 12/19/2023 CLINICAL DATA:  Evaluate etiology of biliary obstruction. Dilated common bile duct measuring 9 mm. EXAM: MRI ABDOMEN WITHOUT AND WITH CONTRAST (INCLUDING MRCP) TECHNIQUE: Multiplanar multisequence MR imaging of the abdomen was performed both before and after the administration of intravenous contrast. Heavily T2-weighted images of the biliary and pancreatic ducts were obtained, and three-dimensional MRCP images were rendered by  post processing. CONTRAST:  5mL GADAVIST  GADOBUTROL  1 MMOL/ML IV SOLN COMPARISON:  Right upper quadrant sonogram 12/18/2023 FINDINGS: Lower chest: No acute findings. Hepatobiliary: No focal enhancing liver lesions identified. The gallbladder is decompressed. No pericholecystic inflammatory change. The common bile duct is dilated measuring 1.2 cm. Moderate to severe intrahepatic bile duct dilatation. No signs of choledocholithiasis. There is abrupt beak like narrowing of the mid common bile duct at the level of the head of pancreas, image 8/4. Pancreas: Mild diffuse edema within the pancreatic parenchyma noted. Mild increase caliber of the main pancreatic duct which measures up to 4 mm at the level of the pancreatic neck, which abruptly decreases in caliber at the head of pancreas. Focal area mild increased T2 signal with restricted diffusion in the anterior head of pancreas measuring 2.2 x 1.2 by 2.2 cm, image 75/6 and image 19/3. There are areas of restricted diffusion identified within the head of pancreas Spleen:  Within normal limits in size and appearance. Adrenals/Urinary Tract: No masses identified. No evidence of hydronephrosis. Stomach/Bowel: Visualized portions within the abdomen are unremarkable. Vascular/Lymphatic: Aortic atherosclerosis. No adenopathy identified. Other:  No ascites or focal fluid collections. Musculoskeletal: No suspicious bone lesions identified. IMPRESSION: 1. The gallbladder is now decompressed. No signs of choledocholithiasis. 2. Moderate to severe intrahepatic bile duct dilatation. There is abrupt beak like narrowing of the mid common bile duct at the level of the head of pancreas. Findings are concerning for a stricture at the level of the head of pancreas. 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 3. Mild diffuse edema within the pancreatic parenchyma noted, which  is concerning for pancreatitis. No signs of pancreatic necrosis or pseudocyst formation. 4. Mild increase caliber of the main pancreatic duct which measures up to 4 mm at the level of the pancreatic neck, which abruptly decreases in caliber at the head of pancreas. Focal area of mild increased T2 signal with restricted diffusion in the anterior head of pancreas measures 2.2 x 1.2 by 2.2 cm. Cannot exclude pancreatic head neoplasm. Consider further evaluation with endoscopic ultrasound and tissue sampling. 5. Aortic Atherosclerosis (ICD10-I70.0). Electronically Signed   By: Kimberley Penman M.D.   On: 12/19/2023 05:18   MR 3D Recon At Scanner Result Date: 12/19/2023 CLINICAL DATA:  Evaluate etiology of biliary obstruction. Dilated common bile duct measuring 9 mm. EXAM: MRI ABDOMEN WITHOUT AND WITH CONTRAST (INCLUDING MRCP) TECHNIQUE: Multiplanar multisequence MR  imaging of the abdomen was performed both before and after the administration of intravenous contrast. Heavily T2-weighted images of the biliary and pancreatic ducts were obtained, and three-dimensional MRCP images were rendered by post processing. CONTRAST:  5mL GADAVIST  GADOBUTROL  1 MMOL/ML IV SOLN COMPARISON:  Right upper quadrant sonogram 12/18/2023 FINDINGS: Lower chest: No acute findings. Hepatobiliary: No focal enhancing liver lesions identified. The gallbladder is decompressed. No pericholecystic inflammatory change. The common bile duct is dilated measuring 1.2 cm. Moderate to severe intrahepatic bile duct dilatation. No signs of choledocholithiasis. There is abrupt beak like narrowing of the mid common bile duct at the level of the head of pancreas, image 8/4. Pancreas: Mild diffuse edema within the pancreatic parenchyma noted. Mild increase caliber of the main pancreatic duct which measures up to 4 mm at the level of the pancreatic neck, which abruptly decreases in caliber at the head of pancreas. Focal area mild increased T2 signal with  restricted diffusion in the anterior head of pancreas measuring 2.2 x 1.2 by 2.2 cm, image 75/6 and image 19/3. There are areas of restricted diffusion identified within the head of pancreas Spleen:  Within normal limits in size and appearance. Adrenals/Urinary Tract: No masses identified. No evidence of hydronephrosis. Stomach/Bowel: Visualized portions within the abdomen are unremarkable. Vascular/Lymphatic: Aortic atherosclerosis. No adenopathy identified. Other:  No ascites or focal fluid collections. Musculoskeletal: No suspicious bone lesions identified. IMPRESSION: 1. The gallbladder is now decompressed. No signs of choledocholithiasis. 2. Moderate to severe intrahepatic bile duct dilatation. There is abrupt beak like narrowing of the mid common bile duct at the level of the head of pancreas. Findings are concerning for a stricture at the level of the head of pancreas. 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 3. Mild diffuse edema within the pancreatic parenchyma noted, which is concerning for pancreatitis. No signs of pancreatic necrosis or pseudocyst formation. 4. Mild increase caliber of the main pancreatic duct which measures up to 4 mm at the level of the pancreatic neck, which abruptly decreases in caliber at the head of pancreas. Focal area of mild increased T2 signal with restricted diffusion in the anterior head of pancreas measures 2.2 x 1.2 by 2.2 cm. Cannot exclude pancreatic head neoplasm. Consider further evaluation with endoscopic ultrasound and tissue sampling. 5. Aortic Atherosclerosis (ICD10-I70.0). Electronically Signed   By: Kimberley Penman M.D.   On: 12/19/2023 05:18    Family Communication: Discussed with patient, she understand and agree. All questions answered.  Disposition: Status is: Inpatient Remains inpatient appropriate because: EUS/ ERCP for tomorrow   Planned Discharge Destination: Home with Home Health     Time spent: 39 minutes  Author: Aisha Hove, MD 12/20/2023 4:16 PM Secure chat 7am to 7pm For on call review [email or web address].

## 2023-12-20 NOTE — Progress Notes (Signed)
 Patient ID: Caroline Snyder, female   DOB: 1941-05-03, 83 y.o.   MRN: [id number]    Progress Note   Subjective   Day # 3 CC; nausea, vomiting, dysphagia, elevated LFTs  Patient feels about the same today, still no appetite, she has not had any vomiting, no complaints of abdominal pain today primary complaint is of mid back pain.  She has been up ambulating with her son.  Patient was scheduled for EUS and ERCP today, procedure has been bumped and will be rescheduled for tomorrow.  Labs today-WBC 4.0/hemoglobin 11.2/hematocrit 33.5 Potassium 3.9/BUN 8/creatinine 0.91 T. bili 2.6/alk phos 284/AST 280/ALT 316  MRI/MRCP 12/18/2023 shows a dilated common bile duct to 1.2 cm with moderate to severe intrahepatic ductal dilation and an abrupt beak like narrowing of the mid common bile duct at the level of the head of the pancreas, possible pancreatic head mass measuring 2.2 x 1.2 x 2.2 cm, with mild increased caliber of the main pancreatic duct measuring up to 4 mm at the level of the pancreatic neck Findings concerning for stricture at the level of the head of the pancreas, rule out underlying neoplasm.    Objective   Vital signs in last 24 hours: Temp:  [97.8 F (36.6 C)-98.5 F (36.9 C)] 97.8 F (36.6 C) (05/20 1227) Pulse Rate:  [60-65] 63 (05/20 1227) Resp:  [16-18] 17 (05/20 1227) BP: (128-151)/(57-75) 146/74 (05/20 1227) SpO2:  [96 %-98 %] 98 % (05/20 1227) Last BM Date : 12/16/23 General:   Elderly frail-appearing white female in NAD Heart:  Regular rate and rhythm; no murmurs Lungs: Respirations even and unlabored, lungs CTA bilaterally Abdomen:  Soft, nontender and nondistended. Normal bowel sounds. Extremities:  Without edema. Neurologic:  Alert and oriented,  grossly normal neurologically. Psych:  Cooperative. Normal mood and affect.  Intake/Output from previous day: 05/19 0701 - 05/20 0700 In: 836.7 [P.O.:120; I.V.:716.7] Out: -  Intake/Output this shift: Total  I/O In: 120 [P.O.:120] Out: -   Lab Results: Recent Labs    12/18/23 1255 12/19/23 0811 12/20/23 0857  WBC 5.3 4.4 4.0  HGB 12.2 11.7* 11.2*  HCT 37.4 35.4* 33.5*  PLT 260 243 228   BMET Recent Labs    12/18/23 1255 12/19/23 0811 12/20/23 0857  NA 137 137 140  K 3.9 3.8 3.9  CL 103 108 109  CO2 24 20* 24  GLUCOSE 105* 94 94  BUN 17 9 8   CREATININE 0.98 0.86 0.91  CALCIUM  9.3 8.9 8.7*   LFT Recent Labs    12/20/23 0857  PROT 5.7*  ALBUMIN 3.1*  AST 280*  ALT 316*  ALKPHOS 284*  BILITOT 2.6*   PT/INR Recent Labs    12/19/23 0811  LABPROT 13.0  INR 1.0    Studies/Results: MR ABDOMEN MRCP W WO CONTAST Result Date: 12/19/2023 CLINICAL DATA:  Evaluate etiology of biliary obstruction. Dilated common bile duct measuring 9 mm. EXAM: MRI ABDOMEN WITHOUT AND WITH CONTRAST (INCLUDING MRCP) TECHNIQUE: Multiplanar multisequence MR imaging of the abdomen was performed both before and after the administration of intravenous contrast. Heavily T2-weighted images of the biliary and pancreatic ducts were obtained, and three-dimensional MRCP images were rendered by post processing. CONTRAST:  5mL GADAVIST  GADOBUTROL  1 MMOL/ML IV SOLN COMPARISON:  Right upper quadrant sonogram 12/18/2023 FINDINGS: Lower chest: No acute findings. Hepatobiliary: No focal enhancing liver lesions identified. The gallbladder is decompressed. No pericholecystic inflammatory change. The common bile duct is dilated measuring 1.2 cm. Moderate to severe intrahepatic bile duct  dilatation. No signs of choledocholithiasis. There is abrupt beak like narrowing of the mid common bile duct at the level of the head of pancreas, image 8/4. Pancreas: Mild diffuse edema within the pancreatic parenchyma noted. Mild increase caliber of the main pancreatic duct which measures up to 4 mm at the level of the pancreatic neck, which abruptly decreases in caliber at the head of pancreas. Focal area mild increased T2 signal with  restricted diffusion in the anterior head of pancreas measuring 2.2 x 1.2 by 2.2 cm, image 75/6 and image 19/3. There are areas of restricted diffusion identified within the head of pancreas Spleen:  Within normal limits in size and appearance. Adrenals/Urinary Tract: No masses identified. No evidence of hydronephrosis. Stomach/Bowel: Visualized portions within the abdomen are unremarkable. Vascular/Lymphatic: Aortic atherosclerosis. No adenopathy identified. Other:  No ascites or focal fluid collections. Musculoskeletal: No suspicious bone lesions identified. IMPRESSION: 1. The gallbladder is now decompressed. No signs of choledocholithiasis. 2. Moderate to severe intrahepatic bile duct dilatation. There is abrupt beak like narrowing of the mid common bile duct at the level of the head of pancreas. Findings are concerning for a stricture at the level of the head of pancreas. 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 3. Mild diffuse edema within the pancreatic parenchyma noted, which is concerning for pancreatitis. No signs of pancreatic necrosis or pseudocyst formation. 4. Mild increase caliber of the main pancreatic duct which measures up to 4 mm at the level of the pancreatic neck, which abruptly decreases in caliber at the head of pancreas. Focal area of mild increased T2 signal with restricted diffusion in the anterior head of pancreas measures 2.2 x 1.2 by 2.2 cm. Cannot exclude pancreatic head neoplasm. Consider further evaluation with endoscopic ultrasound and tissue sampling. 5. Aortic Atherosclerosis (ICD10-I70.0). Electronically Signed   By: Kimberley Penman M.D.   On: 12/19/2023 05:18   MR 3D Recon At Scanner Result Date: 12/19/2023 CLINICAL DATA:  Evaluate etiology of biliary obstruction. Dilated common bile duct measuring 9 mm. EXAM: MRI ABDOMEN WITHOUT AND WITH CONTRAST (INCLUDING  MRCP) TECHNIQUE: Multiplanar multisequence MR imaging of the abdomen was performed both before and after the administration of intravenous contrast. Heavily T2-weighted images of the biliary and pancreatic ducts were obtained, and three-dimensional MRCP images were rendered by post processing. CONTRAST:  5mL GADAVIST  GADOBUTROL  1 MMOL/ML IV SOLN COMPARISON:  Right upper quadrant sonogram 12/18/2023 FINDINGS: Lower chest: No acute findings. Hepatobiliary: No focal enhancing liver lesions identified. The gallbladder is decompressed. No pericholecystic inflammatory change. The common bile duct is dilated measuring 1.2 cm. Moderate to severe intrahepatic bile duct dilatation. No signs of choledocholithiasis. There is abrupt beak like narrowing of the mid common bile duct at the level of the head of pancreas, image 8/4. Pancreas: Mild diffuse edema within the pancreatic parenchyma noted. Mild increase caliber of the main pancreatic duct which measures up to 4 mm at the level of the pancreatic neck, which abruptly decreases in caliber at the head of pancreas. Focal area mild increased T2 signal with restricted diffusion in the anterior head of pancreas measuring 2.2 x 1.2 by 2.2 cm, image 75/6 and image 19/3. There are areas of restricted diffusion identified within the head of pancreas Spleen:  Within normal limits in size and appearance. Adrenals/Urinary Tract: No masses identified. No evidence of hydronephrosis. Stomach/Bowel: Visualized portions within the abdomen are unremarkable. Vascular/Lymphatic: Aortic atherosclerosis. No  adenopathy identified. Other:  No ascites or focal fluid collections. Musculoskeletal: No suspicious bone lesions identified. IMPRESSION: 1. The gallbladder is now decompressed. No signs of choledocholithiasis. 2. Moderate to severe intrahepatic bile duct dilatation. There is abrupt beak like narrowing of the mid common bile duct at the level of the head of pancreas. Findings are concerning for  a stricture at the level of the head of pancreas. 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 3. Mild diffuse edema within the pancreatic parenchyma noted, which is concerning for pancreatitis. No signs of pancreatic necrosis or pseudocyst formation. 4. Mild increase caliber of the main pancreatic duct which measures up to 4 mm at the level of the pancreatic neck, which abruptly decreases in caliber at the head of pancreas. Focal area of mild increased T2 signal with restricted diffusion in the anterior head of pancreas measures 2.2 x 1.2 by 2.2 cm. Cannot exclude pancreatic head neoplasm. Consider further evaluation with endoscopic ultrasound and tissue sampling. 5. Aortic Atherosclerosis (ICD10-I70.0). Electronically Signed   By: Kimberley Penman M.D.   On: 12/19/2023 05:18   DG Shoulder Left Result Date: 12/18/2023 CLINICAL DATA:  Pain. EXAM: LEFT SHOULDER - 2+ VIEW COMPARISON:  None Available. FINDINGS: There is no evidence of fracture or dislocation. Minor acromioclavicular spurring. Glenohumeral joint space is preserved with trace inferior glenoid spurring. No erosions or suspicious bone lesions. Soft tissues are unremarkable. IMPRESSION: Minor acromioclavicular and glenohumeral osteoarthritis. Electronically Signed   By: Chadwick Colonel M.D.   On: 12/18/2023 15:21   US  Abdomen Limited RUQ (LIVER/GB) Result Date: 12/18/2023 CLINICAL DATA:  Right upper quadrant pain skip EXAM: ULTRASOUND ABDOMEN LIMITED RIGHT UPPER QUADRANT COMPARISON:  None Available. FINDINGS: Gallbladder: No gallstones or wall thickening visualized. No sonographic Murphy sign noted by sonographer. Common bile duct: Diameter: 6 mm-9 mm in left lateral decubitus. No intrahepatic biliary ductal dilatation. Liver: No focal lesion identified. Within normal limits in parenchymal echogenicity. Portal vein is patent on color  Doppler imaging with normal direction of blood flow towards the liver. Other: None. IMPRESSION: 1. No cholelithiasis or sonographic evidence for acute cholecystitis. 2. Common bile duct is mildly dilated measuring up to 9 mm. Recommend correlation with LFTs. This can be further evaluated with ERCP or MRCP. Electronically Signed   By: Tyron Gallon M.D.   On: 12/18/2023 15:05       Assessment / Plan:    #67  83 year old female admitted with 1 week history of nausea, vomiting poor intake complaints of upper abdominal pain with some radiation to the back and also with new complaint of dysphagia only present over the past few days.  Initial labs revealed elevated LFTs and subsequent MRI/MRCP shows a mid common bile duct stricture at the level of the head of the pancreas, there is also abnormal increased T2 signal in the anterior head of the pancreas and an area measuring 2.2 x 1.2 x 2.2 cm cannot rule out pancreatic head neoplasm.  LFTs have gradually been on the rise CA 19-9 pending  #2 back pain-probably musculoskeletal start heating pad, only using Tylenol  for pain control #3 dementia #4.  GERD  Plan; full liquid diet, n.p.o. past midnight Pt scheduled/for EGD, EUS and ERCP with Dr. Brice Campi  procedure to be rescheduled for tomorrow at 1 PM, family aware this was a good possibility. Continue to hold Lovenox  Repeat labs in a.m. GI will continue to follow with  you    Principal Problem:   Common bile duct dilation Active Problems:   Nausea and vomiting   Transaminitis     LOS: 2 days   Diontre Harps PA-C 12/20/2023, 2:00 PM

## 2023-12-21 ENCOUNTER — Inpatient Hospital Stay (HOSPITAL_COMMUNITY): Payer: Medicare (Managed Care) | Admitting: Certified Registered Nurse Anesthetist

## 2023-12-21 ENCOUNTER — Encounter (HOSPITAL_COMMUNITY): Payer: Self-pay | Admitting: Hospitalist

## 2023-12-21 ENCOUNTER — Encounter (HOSPITAL_COMMUNITY)
Admission: EM | Disposition: A | Discharge status: Home / Self Care | Payer: Self-pay | Source: Home / Self Care | Attending: Family Medicine

## 2023-12-21 ENCOUNTER — Inpatient Hospital Stay (HOSPITAL_COMMUNITY): Payer: Medicare (Managed Care)

## 2023-12-21 DIAGNOSIS — I899 Noninfective disorder of lymphatic vessels and lymph nodes, unspecified: Secondary | ICD-10-CM

## 2023-12-21 DIAGNOSIS — K831 Obstruction of bile duct: Secondary | ICD-10-CM

## 2023-12-21 DIAGNOSIS — K2971 Gastritis, unspecified, with bleeding: Secondary | ICD-10-CM

## 2023-12-21 DIAGNOSIS — E785 Hyperlipidemia, unspecified: Secondary | ICD-10-CM | POA: Diagnosis not present

## 2023-12-21 DIAGNOSIS — R112 Nausea with vomiting, unspecified: Secondary | ICD-10-CM | POA: Diagnosis not present

## 2023-12-21 DIAGNOSIS — K838 Other specified diseases of biliary tract: Secondary | ICD-10-CM | POA: Diagnosis not present

## 2023-12-21 DIAGNOSIS — R748 Abnormal levels of other serum enzymes: Secondary | ICD-10-CM | POA: Diagnosis not present

## 2023-12-21 DIAGNOSIS — K2951 Unspecified chronic gastritis with bleeding: Secondary | ICD-10-CM

## 2023-12-21 DIAGNOSIS — B9681 Helicobacter pylori [H. pylori] as the cause of diseases classified elsewhere: Secondary | ICD-10-CM

## 2023-12-21 DIAGNOSIS — F339 Major depressive disorder, recurrent, unspecified: Secondary | ICD-10-CM | POA: Diagnosis not present

## 2023-12-21 DIAGNOSIS — K869 Disease of pancreas, unspecified: Secondary | ICD-10-CM | POA: Diagnosis not present

## 2023-12-21 DIAGNOSIS — I10 Essential (primary) hypertension: Secondary | ICD-10-CM

## 2023-12-21 DIAGNOSIS — K8689 Other specified diseases of pancreas: Secondary | ICD-10-CM

## 2023-12-21 DIAGNOSIS — R7401 Elevation of levels of liver transaminase levels: Secondary | ICD-10-CM | POA: Diagnosis not present

## 2023-12-21 DIAGNOSIS — C240 Malignant neoplasm of extrahepatic bile duct: Secondary | ICD-10-CM | POA: Diagnosis not present

## 2023-12-21 HISTORY — PX: FINE NEEDLE ASPIRATION: SHX6590

## 2023-12-21 HISTORY — PX: ERCP: SHX5425

## 2023-12-21 HISTORY — PX: EUS: SHX5427

## 2023-12-21 HISTORY — PX: ESOPHAGOGASTRODUODENOSCOPY: SHX5428

## 2023-12-21 LAB — CBC
HCT: 33.9 % — ABNORMAL LOW (ref 36.0–46.0)
Hemoglobin: 11.4 g/dL — ABNORMAL LOW (ref 12.0–15.0)
MCH: 30.6 pg (ref 26.0–34.0)
MCHC: 33.6 g/dL (ref 30.0–36.0)
MCV: 91.1 fL (ref 80.0–100.0)
Platelets: 213 10*3/uL (ref 150–400)
RBC: 3.72 MIL/uL — ABNORMAL LOW (ref 3.87–5.11)
RDW: 13.1 % (ref 11.5–15.5)
WBC: 4.5 10*3/uL (ref 4.0–10.5)
nRBC: 0 % (ref 0.0–0.2)

## 2023-12-21 LAB — COMPREHENSIVE METABOLIC PANEL WITH GFR
ALT: 341 U/L — ABNORMAL HIGH (ref 0–44)
AST: 337 U/L — ABNORMAL HIGH (ref 15–41)
Albumin: 3.1 g/dL — ABNORMAL LOW (ref 3.5–5.0)
Alkaline Phosphatase: 284 U/L — ABNORMAL HIGH (ref 38–126)
Anion gap: 6 (ref 5–15)
BUN: 6 mg/dL — ABNORMAL LOW (ref 8–23)
CO2: 24 mmol/L (ref 22–32)
Calcium: 8.6 mg/dL — ABNORMAL LOW (ref 8.9–10.3)
Chloride: 109 mmol/L (ref 98–111)
Creatinine, Ser: 0.81 mg/dL (ref 0.44–1.00)
GFR, Estimated: >60 mL/min (ref >60)
Glucose, Bld: 125 mg/dL — ABNORMAL HIGH (ref 70–99)
Potassium: 3.6 mmol/L (ref 3.5–5.1)
Sodium: 139 mmol/L (ref 135–145)
Total Bilirubin: 1.8 mg/dL — ABNORMAL HIGH (ref 0.0–1.2)
Total Protein: 5.7 g/dL — ABNORMAL LOW (ref 6.5–8.1)

## 2023-12-21 SURGERY — ULTRASOUND, UPPER GI TRACT, ENDOSCOPIC
Anesthesia: General

## 2023-12-21 MED ORDER — SUGAMMADEX SODIUM 200 MG/2ML IV SOLN
INTRAVENOUS | Status: DC | PRN
  Administered 2023-12-21: 200 mg via INTRAVENOUS

## 2023-12-21 MED ORDER — POLYVINYL ALCOHOL 1.4 % OP SOLN
1.0000 [drp] | OPHTHALMIC | Status: DC | PRN
  Administered 2023-12-22: 1 [drp] via OPHTHALMIC
  Filled 2023-12-21: qty 15

## 2023-12-21 MED ORDER — ONDANSETRON HCL 4 MG/2ML IJ SOLN
INTRAMUSCULAR | Status: DC | PRN
  Administered 2023-12-21: 4 mg via INTRAVENOUS

## 2023-12-21 MED ORDER — FENTANYL CITRATE (PF) 100 MCG/2ML IJ SOLN
INTRAMUSCULAR | Status: DC | PRN
  Administered 2023-12-21 (×2): 50 ug via INTRAVENOUS

## 2023-12-21 MED ORDER — CIPROFLOXACIN IN D5W 400 MG/200ML IV SOLN
INTRAVENOUS | Status: DC | PRN
  Administered 2023-12-21: 400 mg via INTRAVENOUS

## 2023-12-21 MED ORDER — GLUCAGON HCL RDNA (DIAGNOSTIC) 1 MG IJ SOLR
INTRAMUSCULAR | Status: DC | PRN
  Administered 2023-12-21 (×4): .25 mg via INTRAVENOUS

## 2023-12-21 MED ORDER — TRAZODONE HCL 50 MG PO TABS
50.0000 mg | ORAL_TABLET | Freq: Every evening | ORAL | Status: DC | PRN
  Administered 2023-12-23: 50 mg via ORAL
  Filled 2023-12-21 (×2): qty 1

## 2023-12-21 MED ORDER — CIPROFLOXACIN IN D5W 400 MG/200ML IV SOLN
INTRAVENOUS | Status: AC
  Filled 2023-12-21: qty 200

## 2023-12-21 MED ORDER — ONDANSETRON HCL 4 MG/2ML IJ SOLN
INTRAMUSCULAR | Status: AC
  Filled 2023-12-21: qty 2

## 2023-12-21 MED ORDER — DICLOFENAC SUPPOSITORY 100 MG
RECTAL | Status: DC | PRN
  Administered 2023-12-21: 100 mg via RECTAL

## 2023-12-21 MED ORDER — ROCURONIUM BROMIDE 100 MG/10ML IV SOLN
INTRAVENOUS | Status: DC | PRN
  Administered 2023-12-21: 50 mg via INTRAVENOUS

## 2023-12-21 MED ORDER — DICLOFENAC SUPPOSITORY 100 MG
RECTAL | Status: AC
  Filled 2023-12-21: qty 1

## 2023-12-21 MED ORDER — GLUCAGON HCL RDNA (DIAGNOSTIC) 1 MG IJ SOLR
INTRAMUSCULAR | Status: AC
  Filled 2023-12-21: qty 1

## 2023-12-21 MED ORDER — PHENYLEPHRINE HCL-NACL 20-0.9 MG/250ML-% IV SOLN
INTRAVENOUS | Status: DC | PRN
Start: 2023-12-21 — End: 2023-12-21
  Administered 2023-12-21: 30 ug/min via INTRAVENOUS

## 2023-12-21 MED ORDER — MENTHOL 3 MG MT LOZG
1.0000 | LOZENGE | OROMUCOSAL | Status: DC | PRN
  Administered 2023-12-22 (×2): 3 mg via ORAL
  Filled 2023-12-21 (×2): qty 9

## 2023-12-21 MED ORDER — SODIUM CHLORIDE 0.9 % IV SOLN
INTRAVENOUS | Status: DC | PRN
  Administered 2023-12-21: 25 mL

## 2023-12-21 MED ORDER — SUCRALFATE 1 G PO TABS
1.0000 g | ORAL_TABLET | Freq: Two times a day (BID) | ORAL | Status: DC
  Administered 2023-12-21 – 2023-12-23 (×4): 1 g via ORAL
  Filled 2023-12-21 (×4): qty 1

## 2023-12-21 MED ORDER — DEXAMETHASONE SODIUM PHOSPHATE 10 MG/ML IJ SOLN
INTRAMUSCULAR | Status: DC | PRN
Start: 2023-12-21 — End: 2023-12-21
  Administered 2023-12-21: 5 mg via INTRAVENOUS

## 2023-12-21 MED ORDER — LIDOCAINE HCL (CARDIAC) PF 100 MG/5ML IV SOSY
PREFILLED_SYRINGE | INTRAVENOUS | Status: DC | PRN
  Administered 2023-12-21: 60 mg via INTRAVENOUS

## 2023-12-21 MED ORDER — PROPOFOL 10 MG/ML IV BOLUS
INTRAVENOUS | Status: DC | PRN
  Administered 2023-12-21: 50 mg via INTRAVENOUS

## 2023-12-21 MED ORDER — LACTATED RINGERS IV SOLN: INTRAVENOUS | Status: DC | PRN

## 2023-12-21 MED ORDER — FENTANYL CITRATE (PF) 100 MCG/2ML IJ SOLN
INTRAMUSCULAR | Status: AC
  Filled 2023-12-21: qty 2

## 2023-12-21 MED ORDER — PHENOL 1.4 % MT LIQD
1.0000 | OROMUCOSAL | Status: DC | PRN
  Filled 2023-12-21: qty 177

## 2023-12-21 NOTE — Plan of Care (Signed)

## 2023-12-21 NOTE — Transfer of Care (Signed)
 Immediate Anesthesia Transfer of Care Note  Patient: Caroline Snyder  Procedure(s) Performed: ULTRASOUND, UPPER GI TRACT, ENDOSCOPIC ERCP, WITH INTERVENTION IF INDICATED  Patient Location: PACU  Anesthesia Type:General  Level of Consciousness: awake, alert , and oriented  Airway & Oxygen Therapy: Patient Spontanous Breathing and Patient connected to face mask oxygen  Post-op Assessment: Report given to RN and Post -op Vital signs reviewed and stable  Post vital signs: Reviewed and stable  Last Vitals:  Vitals Value Taken Time  BP 177/72 12/21/23 1630  Temp    Pulse 56 12/21/23 1630  Resp 16 12/21/23 1630  SpO2 100 % 12/21/23 1630  Vitals shown include unfiled device data.  Last Pain:  Vitals:   12/21/23 1333  TempSrc: Temporal  PainSc: 0-No pain      Patients Stated Pain Goal: 0 (12/19/23 1950)  Complications: No notable events documented.

## 2023-12-21 NOTE — Hospital Course (Addendum)
 Caroline Snyder is a 83 y.o. female with a history of hypertension, hyperlipidemia, migraines, GERD.  Patient presented secondary to intractable nausea and vomiting with abdominal pain.  Initial imaging consistent with CBD dilation.  GI consulted.  MRCP obtained which was significant for evidence of possible pancreatic neoplasm in addition to moderate to severe intrahepatic ductal dilation.  Patient underwent ERCP which was significant for a common bile duct stricture which was malignant appearing; patient received 1 plastic biliary stent into the common bile duct in addition to 1 temporary plastic biliary stent into the ventral pancreatic duct.  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  Medical oncology was consulted and have arranged follow-up for outpatient.  GI will follow-up as an outpatient; patient will need KUB in 10 to 14 days to assess if pancreatic duct stent is still present versus migrated.  Also plan for repeat ERCP in 4 months.  Tolerating a diet prior to discharge

## 2023-12-21 NOTE — Op Note (Signed)
 [facility] Patient Name: Caroline Snyder Procedure Date : 12/21/2023 MRN: [id number] Attending MD: Yong Henle , MD, [id number] Date of Birth: 02-02-1941 CSN: [id number] Age: 83 Admit Type: Inpatient Procedure:                ERCP Indications:              Common bile duct stricture, Abnormal MRCP,                            Jaundice, Elevated liver enzymes Providers:                Yong Henle, MD, Jacquelyn "Jaci" Bernetta Brilliant, RN,                            Gabino Joe, Technician Referring MD:              Medicines:                General Anesthesia, Cipro  400 mg IV, Diclofenac 100                            mg rectal, Glucagon 1 mg IV Complications:            No immediate complications. Estimated Blood Loss:     Estimated blood loss was minimal. Procedure:                Pre-Anesthesia Assessment:                           - Prior to the procedure, a History and Physical                            was performed, and patient medications and                            allergies were reviewed. The patient's tolerance of                            previous anesthesia was also reviewed. The risks                            and benefits of the procedure and the sedation                            options and risks were discussed with the patient.                            All questions were answered, and informed consent                            was obtained. Prior Anticoagulants: The patient has                            taken Lovenox  (enoxaparin ), last dose was 1 day  prior to procedure. ASA Grade Assessment: III - A                            patient with severe systemic disease. After                            reviewing the risks and benefits, the patient was                            deemed in satisfactory condition to undergo the                            procedure.                           After obtaining informed  consent, the scope was                            passed under direct vision. Throughout the                            procedure, the patient's blood pressure, pulse, and                            oxygen saturations were monitored continuously. The                            [employer] D single use                            duodenoscope was introduced through the mouth, and                            used to inject contrast into and used to inject                            contrast into the bile duct and ventral pancreatic                            duct. The ERCP was determined to be ASGE Difficulty                            Grade 3 due to challenging cannulation. Successful                            completion of the procedure was aided by performing                            the maneuvers documented (below) in this report.                            The patient tolerated the procedure. Images from  the Exalt scope were not saved completely. Scope In: Scope Out: Findings:      The scout film was normal.      The esophagus was successfully intubated under direct vision without       detailed examination of the pharynx, larynx, and associated structures,       and upper GI tract. The major papilla was normal.      The bile duct could not be cannulated with the Hydratome sphincterotome       in short/semi-long/long positions. Repeated attempts at biliary       cannulation were not successful while using a wire-guided approach.       Eventually in the long-position, this led to placement of the wire       within the pancreatic duct. Decision was made to pursue a double-wire       approach. The 0.035 hydrajagwire was left within the pancreatic duct.      The bile duct could not be cannulated after further attempts.      Decision made to place one 4 Fr by 3 cm temporary plastic biliary stent       with a single external pigtail was placed  into the ventral pancreatic       duct. The stent was in good position.      Again further attempts at cannulation were unsuccessful.      Decision made to pursue a biliary pre-cut fistulotomy measuring 12 mm in       length, which was made with a monofilament needle knife using a freehand       technique using ERBE electrocautery. There was no post-sphincterotomy       bleeding.      After further attempts at cannulation and transitioning to a Revolution       Jagtome, a short 0.025 inch Revolution Tharon Finder was passed into the       biliary tree. The Jagtome sphincterotome was passed over the guidewire       and the bile duct was then deeply cannulated. Contrast was injected. I       personally interpreted the bile duct and pancreatic duct images. Ductal       flow of contrast was adequate. Image quality was adequate. Contrast       extended to the hepatic ducts. Opacification of the entire biliary tree       except for the cystic duct and gallbladder was successful. The middle       third of the main bile duct contained a single severe stenosis 20 mm in       length. The upper third of the main bile duct, left main hepatic duct       and right main hepatic duct were moderately dilated, secondary to       aforementioned stricture. The largest diameter was 14 mm. Cells for       cytology were obtained by brushing the stricutre. One 10 Fr by 7 cm       plastic biliary stent with a single external flap and a single internal       flap was placed into the common bile duct. The stent was in good       position.      A pancreatogram was not performed.      The duodenoscope was withdrawn from the patient. Impression:               - The major papilla  appeared normal.                           - Extremely difficult cannulation requiring attempt                            at double-wire, over pancreatic stent, precut                            fistulotomy to eventually gain biliary access.                            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 The                            stricture was malignant appearing.                           - The upper third of the main bile duct, left main                            hepatic duct and right main hepatic duct were                            moderately dilated, secondary to a stricture. The                            stricture was brushed for cytology.                           - One temporary plastic biliary stent was placed                            into the ventral pancreatic duct to decrease PEP                            and aid in cannulation.                           - One plastic biliary stent was placed into the                            common bile duct to traverse the stricture. Recommendation:           - The patient will be observed post-procedure,                            until all discharge criteria are met.                           - Return patient to hospital ward for ongoing care.                           -  Observe patient's clinical course.                           - Watch for pancreatitis, bleeding, perforation,                            and cholangitis.                           - Check liver enzymes (AST, ALT, alkaline                            phosphatase, bilirubin) in the morning.                           - Await cytology results.                           - Repeat ERCP in 4 months to exchange stent to                            permanent stent pending overall clinical status.                           - PD stent followup with KUB in 10-14 days will be                            arranged by my team.                           - The findings and recommendations were discussed                            with the patient.                           - The findings and recommendations were discussed                             with the patient's family.                           - The findings and recommendations were discussed                            with the referring physician. Procedure Code(s):        --- Professional ---                           [phone], Endoscopic retrograde                            cholangiopancreatography (ERCP); with placement of                            endoscopic stent into biliary or pancreatic duct,  including pre- and post-dilation and guide wire                            passage, when performed, including sphincterotomy,                            when performed, each stent                           43274, 59, Endoscopic retrograde                            cholangiopancreatography (ERCP); with placement of                            endoscopic stent into biliary or pancreatic duct,                            including pre- and post-dilation and guide wire                            passage, when performed, including sphincterotomy,                            when performed, each stent                           40981, Combined endoscopic catheterization of the                            biliary and pancreatic ductal systems, radiological                            supervision and interpretation Diagnosis Code(s):        --- Professional ---                           K83.1, Obstruction of bile duct                           R17, Unspecified jaundice                           R74.8, Abnormal levels of other serum enzymes                           R93.2, Abnormal findings on diagnostic imaging of                            liver and biliary tract CPT copyright 2022 [employer]. All rights reserved. The codes documented in this report are preliminary and upon coder review may  be revised to meet current compliance requirements. Yong Henle, MD 12/21/2023 5:10:00 PM Number of Addenda: 0

## 2023-12-21 NOTE — Progress Notes (Signed)
 PROGRESS NOTE    Caroline Snyder  ZOX:[id number] DOB: 1940/08/05 DOA: 12/18/2023 PCP: Verma Gobble, NP   Brief Narrative: Caroline Snyder is a 83 y.o. female with a history of hypertension, hyperlipidemia, migraines, GERD.  Patient presented secondary to intractable nausea and vomiting with abdominal pain.  Initial imaging consistent with CBD dilation.  GI consulted.  MRCP obtained which was significant for evidence of possible pancreatic neoplasm in addition to moderate to severe intrahepatic ductal dilation.  GI plan for ERCP.   Assessment and Plan:  Epigastric abdominal pain Nausea/vomiting Likely relating to CT findings concerning for pancreatic neoplasm with bile duct dilation.  Symptoms have improved since admission. - Symptom management  Elevated AST/ALT Hyperbilirubinemia Right upper quadrant ultrasound obtained on admission which was significant for CBD dilation measuring up to 9 mm.  MRCP significant for moderate to severe intrahepatic bile duct dilation with concern for stricture at the level of the head of pancreas with concern for possible pancreatic head neoplasm.  Gastroenterology consulted with plan for ERCP - GI recommendations: ERCP planned with biopsy  Anemia Acute.  Mild.  No evidence of acute hemorrhaging at this time.  Stable.  Primary hypertension -Continue hydrochlorothiazide   GERD -Continue Protonix   Hyperlipidemia - Continue Crestor  20 mg daily    DVT prophylaxis: Lovenox  Code Status:   Code Status: Full Code Family Communication: None at bedside Disposition Plan: Discharge home pending continued workup/management for elevated LFTs/abnormal pain   Consultants:  [facility]  Procedures:  None  Antimicrobials: None   Subjective: No abdominal pain, nausea or vomiting. Feels better today.  Objective: BP (!) 166/78   Pulse 62   Temp 98 F (36.7 C)   Resp 18   Ht 5' (1.524 m)   Wt 44.5 kg   SpO2 99%   BMI  19.14 kg/m   Examination:  General exam: Appears calm and comfortable Respiratory system: Clear to auscultation. Respiratory effort normal. Cardiovascular system: S1 & S2 heard, RRR. No murmurs, rubs, gallops or clicks. Gastrointestinal system: Abdomen is nondistended, soft and nontender. Normal bowel sounds heard. Central nervous system: Alert and oriented. No focal neurological deficits. Musculoskeletal: No edema. No calf tenderness Psychiatry: Judgement and insight appear normal. Mood & affect appropriate.    Data Reviewed: I have personally reviewed following labs and imaging studies  CBC Lab Results  Component Value Date   WBC 4.5 12/21/2023   RBC 3.72 (L) 12/21/2023   HGB 11.4 (L) 12/21/2023   HCT 33.9 (L) 12/21/2023   MCV 91.1 12/21/2023   MCH 30.6 12/21/2023   PLT 213 12/21/2023   MCHC 33.6 12/21/2023   RDW 13.1 12/21/2023   LYMPHSABS 1.6 12/18/2023   MONOABS 0.4 12/18/2023   EOSABS 0.1 12/18/2023   BASOSABS 0.0 12/18/2023     Last metabolic panel Lab Results  Component Value Date   NA 139 12/21/2023   K 3.6 12/21/2023   CL 109 12/21/2023   CO2 24 12/21/2023   BUN 6 (L) 12/21/2023   CREATININE 0.81 12/21/2023   GLUCOSE 125 (H) 12/21/2023   GFRNONAA >60 12/21/2023   GFRAA 67 01/12/2021   CALCIUM  8.6 (L) 12/21/2023   PHOS 2.7 09/13/2023   PROT 5.7 (L) 12/21/2023   ALBUMIN 3.1 (L) 12/21/2023   LABGLOB 2.8 01/29/2017   AGRATIO 1.7 01/29/2017   BILITOT 1.8 (H) 12/21/2023   ALKPHOS 284 (H) 12/21/2023   AST 337 (H) 12/21/2023   ALT 341 (H) 12/21/2023   ANIONGAP 6 12/21/2023    GFR:  Estimated Creatinine Clearance: 36.3 mL/min (by C-G formula based on SCr of 0.81 mg/dL).  Recent Results (from the past 240 hours)  Urine Culture     Status: Abnormal   Collection Time: 12/18/23  1:56 PM   Specimen: Urine, Clean Catch  Result Value Ref Range Status   Specimen Description URINE, CLEAN CATCH  Final   Special Requests NONE  Final   Culture (A)  Final     <10,000 COLONIES/mL INSIGNIFICANT GROWTH Performed at [facility], [address] [place], [place] [address]    Report Status 12/19/2023 FINAL  Final      Radiology Studies: No results found.    LOS: 3 days    Aneita Keens, MD [facility] 12/21/2023, 9:24 AM   If 7PM-7AM, please contact night-coverage [email or web address]

## 2023-12-21 NOTE — Anesthesia Preprocedure Evaluation (Addendum)
 Anesthesia Evaluation  Patient identified by MRN, date of birth, ID band Patient awake    Reviewed: Allergy & Precautions, NPO status , Patient's Chart, lab work & pertinent test results  History of Anesthesia Complications Negative for: history of anesthetic complications  Airway Mallampati: II  TM Distance: >3 FB Neck ROM: Full    Dental  (+) Dental Advisory Given   Pulmonary neg pulmonary ROS   breath sounds clear to auscultation       Cardiovascular hypertension, Pt. on medications  Rhythm:Regular Rate:Normal     Neuro/Psych  Headaches PSYCHIATRIC DISORDERS  Depression     Memory loss    GI/Hepatic ,GERD  ,, Elevated LFTs    Endo/Other  negative endocrine ROS    Renal/GU negative Renal ROS     Musculoskeletal negative musculoskeletal ROS (+)    Abdominal   Peds  Hematology  (+) Blood dyscrasia, anemia   Anesthesia Other Findings   Reproductive/Obstetrics                             Anesthesia Physical Anesthesia Plan  ASA: 3  Anesthesia Plan: General   Post-op Pain Management: Minimal or no pain anticipated   Induction: Intravenous  PONV Risk Score and Plan: 3 and Treatment may vary due to age or medical condition, Ondansetron  and TIVA  Airway Management Planned: Oral ETT  Additional Equipment: None  Intra-op Plan:   Post-operative Plan: Extubation in OR  Informed Consent:   Plan Discussed with: CRNA and Anesthesiologist  Anesthesia Plan Comments:        Anesthesia Quick Evaluation

## 2023-12-21 NOTE — Anesthesia Procedure Notes (Signed)
 Procedure Name: Intubation Date/Time: 12/21/2023 1:54 PM  Performed by: Loreda Rodriguez, CRNAPre-anesthesia Checklist: Patient identified, Emergency Drugs available, Suction available and Patient being monitored Patient Re-evaluated:Patient Re-evaluated prior to induction Oxygen Delivery Method: Circle System Utilized Preoxygenation: Pre-oxygenation with 100% oxygen Induction Type: IV induction Ventilation: Mask ventilation without difficulty Laryngoscope Size: Mac and 3 Grade View: Grade I Tube type: Oral Tube size: 7.0 mm Number of attempts: 1 Airway Equipment and Method: Stylet and Oral airway Placement Confirmation: ETT inserted through vocal cords under direct vision, positive ETCO2 and breath sounds checked- equal and bilateral Secured at: 20 cm Tube secured with: Tape Dental Injury: Teeth and Oropharynx as per pre-operative assessment

## 2023-12-21 NOTE — Care Management Important Message (Signed)
 Important Message  Patient Details  Name: Caroline Snyder MRN: [id number] Date of Birth: Dec 23, 1940   Important Message Given:  Yes - Medicare IM     Wynonia Hedges 12/21/2023, 2:49 PM

## 2023-12-21 NOTE — Progress Notes (Signed)
 Patient is back after the procedure. Alert. On RA. Family at bed side. Will continue to monitor

## 2023-12-21 NOTE — Care Management Important Message (Signed)
 Important Message  Patient Details  Name: Caroline Snyder MRN: [id number] Date of Birth: 04-07-1941   Important Message Given:  Yes - Medicare IM     Wynonia Hedges 12/21/2023, 1:35 PM

## 2023-12-21 NOTE — Op Note (Signed)
 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 Estimated Blood Loss:     Estimated blood loss was minimal. Procedure:                Pre-Anesthesia Assessment:                           - Prior to the procedure, a History and Physical                            was performed, and patient medications and                            allergies were reviewed. The patient's tolerance of                            previous anesthesia was also reviewed. The risks                            and benefits of the procedure and the sedation                            options and risks were discussed with the patient.                            All questions were answered, and informed consent                            was obtained. Prior Anticoagulants: The patient has                            taken Lovenox  (enoxaparin ), last dose was 1 day                            prior to procedure. ASA Grade Assessment: III - A  patient with severe systemic disease. After                            reviewing the risks and benefits, the patient was                            deemed in satisfactory condition to undergo the                            procedure.                           After obtaining informed consent, the endoscope was                            passed under direct  vision. Throughout the                            procedure, the patient's blood pressure, pulse, and                            oxygen saturations were monitored continuously. The                            [id number]-H190 ([id number]) Olympus endoscope was introduced                            through the mouth, and advanced to the second part                            of duodenum. The [employer] D                            single use duodenoscope was introduced through the                            mouth, and advanced to the area of papilla. The                            GF-[id number] ([id number]) Olympus linear ultrasound scope                            was introduced through the mouth, and advanced to                            the duodenum for ultrasound examination from the                            stomach and duodenum. The upper EUS was                            accomplished without difficulty. The patient  tolerated the procedure. Scope In: Scope Out: Findings:      ENDOSCOPIC FINDING: :      No gross lesions were noted in the entire esophagus.      The Z-line was regular and was found 40 cm from the incisors.      Diffuse severe inflammation with hemorrhage characterized by erythema       and friability was found in the entire examined stomach. Biopsies were       taken with a cold forceps for histology and Helicobacter pylori testing.      No gross lesions were noted in the duodenal bulb, in the first portion       of the duodenum and in the second portion of the duodenum.      The major papilla was normal.      ENDOSONOGRAPHIC FINDING: :      An irregular hypoechoic mass was identified endosonographically within       the middle third of the main bile duct. The mass measured 17 mm by 13 mm       in maximal cross-sectional diameter. The outer margins were irregular.       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 Fine needle biopsy was performed.       Color Doppler imaging was utilized prior to needle puncture to confirm a       lack of significant vascular structures within the needle path. Six       passes were made with the 22 gauge Acquire biopsy needle using a       transduodenal approach. A visible core of tissue was obtained.       Preliminary cytologic examination and touch preps were performed. Final       cytology results are pending.      Pancreatic parenchymal abnormalities were noted in the entire pancreas.       These consisted of lobularity without honeycombing and hyperechoic       strands.      The diameter of the main pancreatic duct (MPD) measured:      - HOP 2.0 mm (head of pancreas)      - NOP 2.8 mm (neck of pancreas)      - BOP 2.0 mm (body of the pancreas)      - TOP 1.1 mm (tail of the pancreas).      Endosonographic imaging of the ampulla showed no intramural       (subepithelial) lesion.      Endosonographic imaging in the visualized portion of the liver showed no       mass.      No malignant-appearing lymph nodes were visualized in the celiac region       (level 20), peripancreatic region and porta hepatis region.      The celiac region was visualized. Impression:               EGD Impression:                           - No gross lesions in the entire esophagus. Z-line                            regular, 40 cm from the incisors.                           -  Gastritis with hemorrhage. Biopsied.                           - No gross lesions in the duodenal bulb, in the                            first portion of the duodenum and in the second                            portion of the duodenum.                           - Normal major papilla.                           EUS Impression:                           - A mass was found within the middle third of the                            main bile duct. Cytology results are  pending.                            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 Fine needle biopsy performed.                           - Pancreatic parenchymal abnormalities consisting                            of lobularity and hyperechoic strands were noted in                            the entire pancreas. No overt mass noted within the                            pancreas.                           - Main pancreatic duct (MPD) diameter was measured.                            Endosonographically, the MPD had a normal                            appearance otherwise.                           - No malignant-appearing lymph nodes were  visualized in the celiac region (level 20),                            peripancreatic region and porta hepatis region. Recommendation:           - Proceed to scheduled ERCP.                           - Observe patient's clinical course.                           - Await cytology results and await path results.                           - The findings and recommendations were discussed                            with the patient.                           - The findings and recommendations were discussed                            with the patient's family.                           - The findings and recommendations were discussed                            with the referring physician. Procedure Code(s):        --- Professional ---                           [phone], Esophagogastroduodenoscopy, flexible,                            transoral; with transendoscopic ultrasound-guided                            intramural or transmural fine needle                            aspiration/biopsy(s), (includes endoscopic                            ultrasound examination limited to the esophagus,                             stomach or duodenum, and adjacent structures)                           43239, 59, Esophagogastroduodenoscopy, flexible,                            transoral; with biopsy, single or multiple Diagnosis Code(s):        --- Professional ---  K29.71, Gastritis, unspecified, with bleeding                           K83.8, Other specified diseases of biliary tract                           K86.9, Disease of pancreas, unspecified                           I89.9, Noninfective disorder of lymphatic vessels                            and lymph nodes, unspecified CPT copyright 2022 American Medical Association. All rights reserved. The codes documented in this report are preliminary and upon coder review may  be revised to meet current compliance requirements. Yong Henle, MD 12/21/2023 4:58:24 PM Number of Addenda: 0

## 2023-12-21 NOTE — Care Management Important Message (Signed)
 Important Message  Patient Details  Name: Caroline Snyder MRN: [id number] Date of Birth: 1940/11/04   Important Message Given:  Yes - Medicare IM     Wynonia Hedges 12/21/2023, 12:29 PM

## 2023-12-21 NOTE — Interval H&P Note (Signed)
 History and Physical Interval Note:  12/21/2023 1:31 PM  MICKIE BADDERS  has presented today for surgery, with the diagnosis of biliary stricture.  The various methods of treatment have been discussed with the patient and family. After consideration of risks, benefits and other options for treatment, the patient has consented to  Procedure(s): ULTRASOUND, UPPER GI TRACT, ENDOSCOPIC (N/A) ERCP, WITH INTERVENTION IF INDICATED (N/A) as a surgical intervention.  The patient's history has been reviewed, patient examined, no change in status, stable for surgery.  I have reviewed the patient's chart and labs.  Questions were answered to the patient's satisfaction.    The risks of an EUS including intestinal perforation, bleeding, infection, aspiration, and medication effects were discussed as was the possibility it may not give a definitive diagnosis if a biopsy is performed.  When a biopsy of the pancreas is done as part of the EUS, there is an additional risk of pancreatitis at the rate of about 1-2%.  It was explained that procedure related pancreatitis is typically mild, although it can be severe and even life threatening, which is why we do not perform random pancreatic biopsies and only biopsy a lesion/area we feel is concerning enough to warrant the risk.  The risks of an ERCP were discussed at length, including but not limited to the risk of perforation, bleeding, abdominal pain, post-ERCP pancreatitis (while usually mild can be severe and even life threatening).    Everhett Bozard Mansouraty Jr

## 2023-12-22 ENCOUNTER — Inpatient Hospital Stay (HOSPITAL_COMMUNITY): Payer: Medicare (Managed Care)

## 2023-12-22 ENCOUNTER — Telehealth: Payer: Self-pay

## 2023-12-22 DIAGNOSIS — R1010 Upper abdominal pain, unspecified: Secondary | ICD-10-CM

## 2023-12-22 DIAGNOSIS — K831 Obstruction of bile duct: Secondary | ICD-10-CM

## 2023-12-22 DIAGNOSIS — R112 Nausea with vomiting, unspecified: Secondary | ICD-10-CM | POA: Diagnosis not present

## 2023-12-22 DIAGNOSIS — T85528A Displacement of other gastrointestinal prosthetic devices, implants and grafts, initial encounter: Secondary | ICD-10-CM

## 2023-12-22 DIAGNOSIS — R110 Nausea: Secondary | ICD-10-CM | POA: Diagnosis not present

## 2023-12-22 DIAGNOSIS — R7401 Elevation of levels of liver transaminase levels: Secondary | ICD-10-CM | POA: Diagnosis not present

## 2023-12-22 DIAGNOSIS — K838 Other specified diseases of biliary tract: Secondary | ICD-10-CM

## 2023-12-22 DIAGNOSIS — R748 Abnormal levels of other serum enzymes: Secondary | ICD-10-CM | POA: Diagnosis not present

## 2023-12-22 LAB — CBC
HCT: 33.4 % — ABNORMAL LOW (ref 36.0–46.0)
Hemoglobin: 11.3 g/dL — ABNORMAL LOW (ref 12.0–15.0)
MCH: 30.7 pg (ref 26.0–34.0)
MCHC: 33.8 g/dL (ref 30.0–36.0)
MCV: 90.8 fL (ref 80.0–100.0)
Platelets: 245 10*3/uL (ref 150–400)
RBC: 3.68 MIL/uL — ABNORMAL LOW (ref 3.87–5.11)
RDW: 13.2 % (ref 11.5–15.5)
WBC: 6.8 10*3/uL (ref 4.0–10.5)
nRBC: 0 % (ref 0.0–0.2)

## 2023-12-22 LAB — COMPREHENSIVE METABOLIC PANEL WITH GFR
ALT: 326 U/L — ABNORMAL HIGH (ref 0–44)
AST: 221 U/L — ABNORMAL HIGH (ref 15–41)
Albumin: 3.1 g/dL — ABNORMAL LOW (ref 3.5–5.0)
Alkaline Phosphatase: 262 U/L — ABNORMAL HIGH (ref 38–126)
Anion gap: 9 (ref 5–15)
BUN: 10 mg/dL (ref 8–23)
CO2: 20 mmol/L — ABNORMAL LOW (ref 22–32)
Calcium: 8.5 mg/dL — ABNORMAL LOW (ref 8.9–10.3)
Chloride: 107 mmol/L (ref 98–111)
Creatinine, Ser: 0.97 mg/dL (ref 0.44–1.00)
GFR, Estimated: 58 mL/min — ABNORMAL LOW (ref >60)
Glucose, Bld: 131 mg/dL — ABNORMAL HIGH (ref 70–99)
Potassium: 3.7 mmol/L (ref 3.5–5.1)
Sodium: 136 mmol/L (ref 135–145)
Total Bilirubin: 1 mg/dL (ref 0.0–1.2)
Total Protein: 5.8 g/dL — ABNORMAL LOW (ref 6.5–8.1)

## 2023-12-22 LAB — LIPASE, BLOOD: Lipase: 166 U/L — ABNORMAL HIGH (ref 11–51)

## 2023-12-22 LAB — CYTOLOGY - NON PAP

## 2023-12-22 MED ORDER — IOHEXOL 350 MG/ML SOLN
50.0000 mL | Freq: Once | INTRAVENOUS | Status: AC | PRN
  Administered 2023-12-22: 50 mL via INTRAVENOUS

## 2023-12-22 MED ORDER — ENSURE ENLIVE PO LIQD
237.0000 mL | Freq: Two times a day (BID) | ORAL | Status: DC
  Administered 2023-12-22: 237 mL via ORAL

## 2023-12-22 MED ORDER — LIDOCAINE 5 % EX PTCH
1.0000 | MEDICATED_PATCH | CUTANEOUS | Status: DC
  Administered 2023-12-22: 1 via TRANSDERMAL
  Filled 2023-12-22 (×2): qty 1

## 2023-12-22 NOTE — Telephone Encounter (Signed)
-----   Message from [name] sent at 12/22/2023  3:11 AM EDT ----- Regarding: PD stent follow-up and ERCP follow-up Caroline Snyder, This patient needs a KUB in 2 weeks for PD stent evaluation. This patient needs an ERCP stent exchange/recall for 4 months. Thanks. GM

## 2023-12-22 NOTE — Progress Notes (Signed)
 PROGRESS NOTE    Caroline Snyder  NWG:[id number] DOB: 1940-08-12 DOA: 12/18/2023 PCP: Verma Gobble, NP   Brief Narrative: Caroline Snyder is a 83 y.o. female with a history of hypertension, hyperlipidemia, migraines, GERD.  Patient presented secondary to intractable nausea and vomiting with abdominal pain.  Initial imaging consistent with CBD dilation.  GI consulted.  MRCP obtained which was significant for evidence of possible pancreatic neoplasm in addition to moderate to severe intrahepatic ductal dilation.  GI plan for ERCP.   Assessment and Plan:  Epigastric abdominal pain Nausea/vomiting Likely relating to CT findings concerning for pancreatic neoplasm with bile duct dilation.  Symptoms have improved since admission. - Symptom management  Elevated AST/ALT Hyperbilirubinemia Right upper quadrant ultrasound obtained on admission which was significant for CBD dilation measuring up to 9 mm.  MRCP significant for moderate to severe intrahepatic bile duct dilation with concern for stricture at the level of the head of pancreas with concern for possible pancreatic head neoplasm.  Gastroenterology consulted and performed an ERCP on 5/21, placing a CBD stent and pancreatic stent; biopsies obtained and pending. Bilirubin improved. AST/ALT mildly improved. - GI recommendations: liquid diet, CT chest - Obtain lipase secondary to back pain today; possible post-ERCP pancreatitis  Back pain Chest pain Unclear etiology. Reproducible back pain indicates possible musculoskeletal etiology. Chest pain is vague and unclear from patient's description. -EKG, chest imaging, lidocaine patch for back pain  Anemia Acute.  Mild.  No evidence of acute hemorrhaging at this time.  Stable.  Primary hypertension -Continue hydrochlorothiazide   GERD -Continue Protonix   Hyperlipidemia - Continue Crestor  20 mg daily    DVT prophylaxis: Lovenox  Code Status:   Code Status: Full Code Family  Communication: Son, brother, sister of daughter-in-law at bedside Disposition Plan: Discharge home pending continued workup/management for elevated LFTs/abnormal pain   Consultants:  [facility] [facility]  Procedures:  None  Antimicrobials: None   Subjective: Some right sided chest pain that she cannot qualify. Reports of chest pain, although patient is unclear about this. Pain is not worse with eating her breakfast. She has had some emesis earlier but feels okay at this time.  Objective: BP (!) 160/85 (BP Location: Right Arm)   Pulse 61   Temp 98.1 F (36.7 C) (Oral)   Resp 17   Ht 5' (1.524 m)   Wt 44.5 kg   SpO2 99%   BMI 19.16 kg/m   Examination:  General exam: Appears calm and comfortable Respiratory system: Clear to auscultation. Respiratory effort normal. Cardiovascular system: S1 & S2 heard, RRR. No murmurs. Gastrointestinal system: Abdomen is soft and nontender. Normal bowel sounds heard. Central nervous system: Alert. No focal neurological deficits. Musculoskeletal: No edema. No calf tenderness Psychiatry: Judgement and insight appear normal. Mood & affect appropriate.    Data Reviewed: I have personally reviewed following labs and imaging studies  CBC Lab Results  Component Value Date   WBC 6.8 12/22/2023   RBC 3.68 (L) 12/22/2023   HGB 11.3 (L) 12/22/2023   HCT 33.4 (L) 12/22/2023   MCV 90.8 12/22/2023   MCH 30.7 12/22/2023   PLT 245 12/22/2023   MCHC 33.8 12/22/2023   RDW 13.2 12/22/2023   LYMPHSABS 1.6 12/18/2023   MONOABS 0.4 12/18/2023   EOSABS 0.1 12/18/2023   BASOSABS 0.0 12/18/2023     Last metabolic panel Lab Results  Component Value Date   NA 136 12/22/2023   K 3.7 12/22/2023   CL 107 12/22/2023   CO2 20 (L) 12/22/2023  BUN 10 12/22/2023   CREATININE 0.97 12/22/2023   GLUCOSE 131 (H) 12/22/2023   GFRNONAA 58 (L) 12/22/2023   GFRAA 67 01/12/2021   CALCIUM  8.5 (L) 12/22/2023   PHOS 2.7 09/13/2023   PROT 5.8 (L)  12/22/2023   ALBUMIN 3.1 (L) 12/22/2023   LABGLOB 2.8 01/29/2017   AGRATIO 1.7 01/29/2017   BILITOT 1.0 12/22/2023   ALKPHOS 262 (H) 12/22/2023   AST 221 (H) 12/22/2023   ALT 326 (H) 12/22/2023   ANIONGAP 9 12/22/2023    GFR: Estimated Creatinine Clearance: 30.3 mL/min (by C-G formula based on SCr of 0.97 mg/dL).  Recent Results (from the past 240 hours)  Urine Culture     Status: Abnormal   Collection Time: 12/18/23  1:56 PM   Specimen: Urine, Clean Catch  Result Value Ref Range Status   Specimen Description URINE, CLEAN CATCH  Final   Special Requests NONE  Final   Culture (A)  Final    <10,000 COLONIES/mL INSIGNIFICANT GROWTH Performed at [facility], [address], [place], [place] [address]    Report Status 12/19/2023 FINAL  Final      Radiology Studies: DG C-Arm 1-60 Min-No Report Result Date: 12/21/2023 Fluoroscopy was utilized by the requesting physician.  No radiographic interpretation.      LOS: 4 days    Aneita Keens, MD [facility] 12/22/2023, 1:04 PM   If 7PM-7AM, please contact night-coverage [email or web address]

## 2023-12-22 NOTE — Telephone Encounter (Signed)
 Recall entered   KUB order entered will call pt in 2 weeks as reminder

## 2023-12-22 NOTE — Progress Notes (Signed)
 Patient having chest pain. New orders for EKG and labs. She is also having N/V. Will give pain medicine and antiemetic. Will continue to monitor.

## 2023-12-22 NOTE — Plan of Care (Signed)

## 2023-12-22 NOTE — Evaluation (Signed)
 Physical Therapy Brief Evaluation and Discharge Note Patient Details Name: Caroline Snyder MRN: [id number] DOB: 1941/02/10 Today's Date: 12/22/2023   History of Present Illness  83 yo female admitted 5/18 with N/V with common bile duct dilation. 5/21 endoscopic ultrasound and ERCP. PMhx:HTN, HLD, migraines, GERD  Clinical Impression  Pt pleasant and reports low back pain since being prone for procedure yesterday. Pt educated to positioning in bed and recliner as well as use of heat and continued mobility. Pt states she has been walking in the hall throughout the day with family without trouble. Pt lives alone, has supportive family and no further therapy needs at this time. Will sign off.        PT Assessment Patient does not need any further PT services  Assistance Needed at Discharge  PRN    Equipment Recommendations None recommended by PT  Recommendations for Other Services       Precautions/Restrictions Precautions Precautions: None        Mobility  Bed Mobility   Supine/Sidelying to sit: Modified independent (Device/Increased time) Sit to supine/sidelying: Modified independent (Device/Increased time) General bed mobility comments: was getting on bed on arrival and able to exit without assist  Transfers Overall transfer level: Modified independent                      Ambulation/Gait Ambulation/Gait assistance: Independent Gait Distance (Feet): 30 Feet Assistive device: None Gait Pattern/deviations: Decreased stride length, Step-through pattern Gait Speed: Below normal General Gait Details: decreased stride and speed due to pain, deferred further distance as she just walked in hall with family  Home Activity Instructions    Stairs            Modified Rankin (Stroke Patients Only)        Balance Overall balance assessment: No apparent balance deficits (not formally assessed)                        Pertinent Vitals/Pain PT -  Brief Vital Signs All Vital Signs Stable: Yes Pain Assessment Pain Assessment: 0-10 Pain Score: 6  Pain Location: back Pain Descriptors / Indicators: Aching Pain Intervention(s): Limited activity within patient's tolerance, Monitored during session, Repositioned, Heat applied     Home Living Family/patient expects to be discharged to:: Private residence Living Arrangements: Alone Available Help at Discharge: Family;Available 24 hours/day Home Environment: Stairs to enter  [employer] of Steps: 1 Home Equipment: None   Additional Comments: goes out dancing each week, drives and cares for herself    Prior Function Level of Independence: Independent      UE/LE Assessment   UE ROM/Strength/Tone/Coordination: WFL    LE ROM/Strength/Tone/Coordination: [facility]      Communication   Communication Communication: No apparent difficulties     Cognition Overall Cognitive Status: Appears within functional limits for tasks assessed/performed       General Comments      Exercises     Assessment/Plan    PT Problem List         PT Visit Diagnosis Other abnormalities of gait and mobility (R26.89)    No Skilled PT Patient at baseline level of functioning;Patient will have necessary level of assist by caregiver at discharge   Co-evaluation                AMPAC 6 Clicks Help needed turning from your back to your side while in a flat bed without using bedrails?: None Help needed moving from  lying on your back to sitting on the side of a flat bed without using bedrails?: None Help needed moving to and from a bed to a chair (including a wheelchair)?: None Help needed standing up from a chair using your arms (e.g., wheelchair or bedside chair)?: None Help needed to walk in hospital room?: None Help needed climbing 3-5 steps with a railing? : None 6 Click Score: 24      End of Session   Activity Tolerance: Patient tolerated treatment well Patient left: in chair;with  call bell/phone within reach;with family/visitor present Nurse Communication: Mobility status PT Visit Diagnosis: Other abnormalities of gait and mobility (R26.89)     Time: 2725-3664 PT Time Calculation (min) (ACUTE ONLY): 17 min  Charges:   PT Evaluation $PT Eval Low Complexity: 1 Low      Caroline Snyder, PT Acute Rehabilitation Services Office: [phone]   Caroline Snyder  12/22/2023, 12:48 PM

## 2023-12-22 NOTE — Progress Notes (Addendum)
 Patient ID: Caroline Snyder, female   DOB: 1940-10-02, 83 y.o.   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  Fine-needle biopsy done with no malignant appearing lymph nodes visualized in the celiac region peripancreatic region or porta hepatis ERCP-very difficult cannulation, requiring attempt at double wire, over pancreatic stent precut fistulotomy to gain biliary access, single severe biliary stricture noted in the middle third of the main bile duct stricture malignant appearing and 1 temporary plastic stent placed into the ventral pancreatic duct and 1 plastic biliary stent placed to the common bile duct to traverse the stricture  Biopsy and cytology pending CA 19-9-180  WBC 6.8/hemoglobin 11.3/hematocrit 33.4/MCV 90 Potassium 3.7/BUN 10/creatinine 0.97 T. bili 1.0/alk phos 262/AST 221/ALT 326 Lipase pending  Patient's complaining of some right-sided anterior chest discomfort and shoulder pain today, sitting up in bed trying to eat, says she still somewhat nauseated but would like to try some solid food.  No complaints of abdominal pain at present    Objective   Vital signs in last 24 hours: Temp:  [97.7 F (36.5 C)-98.5 F (36.9 C)] 97.7 F (36.5 C) (05/22 0751) Pulse Rate:  [55-79] 65 (05/22 0751) Resp:  [13-22] 16 (05/22 0509) BP: (123-183)/(67-91) 183/70 (05/22 0751) SpO2:  [97 %-100 %] 98 % (05/22 0509) Weight:  [44.5 kg] 44.5 kg (05/21 1333) Last BM Date : 12/16/23 General:   Elderly white female in NAD uncomfortable appearing, frail Heart:  Regular rate and rhythm; no  murmurs Lungs: Respirations even and unlabored, lungs CTA bilaterally-she does have some tenderness to palpation in her right upper chest wall right shoulder area Abdomen:  Soft, nontender and nondistended. Normal bowel sounds. Extremities:  Without edema. Neurologic:  Alert and oriented,  grossly normal neurologically. Psych:  Cooperative. Normal mood and affect.  Intake/Output from previous day: 05/21 0701 - 05/22 0700 In: 50 [P.O.:50] Out: -  Intake/Output this shift: No intake/output data recorded.  Lab Results: Recent Labs    12/20/23 0857 12/21/23 0603 12/22/23 0642  WBC 4.0 4.5 6.8  HGB 11.2* 11.4* 11.3*  HCT 33.5* 33.9* 33.4*  PLT 228 213 245   BMET Recent Labs    12/20/23 0857 12/21/23 0603 12/22/23 0642  NA 140 139 136  K 3.9 3.6 3.7  CL 109 109 107  CO2 24 24 20*  GLUCOSE 94 125* 131*  BUN 8 6* 10  CREATININE 0.91 0.81 0.97  CALCIUM  8.7* 8.6* 8.5*   LFT Recent Labs    12/22/23 0642  PROT 5.8*  ALBUMIN 3.1*  AST 221*  ALT 326*  ALKPHOS 262*  BILITOT 1.0   PT/INR No results for input(s): "LABPROT", "INR" in the last 72 hours.     Assessment / Plan:    #84 83 year old female admitted after 1 week history of nausea, intermittent vomiting, poor oral intake and complaints of upper abdominal pain with radiation to her back.  She also had a vague complaint of dysphagia.  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  Biopsy was done at the time of EUS, and cytologies with ERCP.  Temporary pancreatic duct stent also placed  Patient tolerated procedure well, no new complaints today, does not have much appetite still some nausea .  She has had various complaints of back shoulder and chest discomfort over the past few days I think her current right shoulder and right chest wall pain likely musculoskeletal secondary  to positioning with prolonged procedures yesterday  Plan; advance to regular diet, encourage supplements between meals Await biopsies and brushings  If patient can eat, she can be discharged home.  Will likely need oncology referral but this can be arranged once biopsies and brushings have returned She will need a KUB outpatient in about 10 days, and will need repeat ERCP in about 4 months to exchange for permanent metal stent.  These will be arranged as outpatient  Will need to continue daily PPI on discharge, and Carafate twice daily x 1 month for gastropathy noted at the time of procedures.  Likely will need an analgesic of some sort at time of discharge.     Principal Problem:   Common bile duct dilation Active Problems:   Nausea and vomiting   Transaminitis   Elevated liver enzymes   Gastritis with hemorrhage   Common bile duct dilatation     LOS: 4 days   Shakil Dirk EsterwoodPA-C  12/22/2023, 8:42 AM

## 2023-12-22 NOTE — Progress Notes (Signed)
 OT Cancellation Note  Patient Details Name: Caroline Snyder MRN: [id number] DOB: [birth year]   Cancelled Treatment:    Reason Eval/Treat Not Completed: OT screened, no needs identified, will sign off. Per conversation with PT, pt Independent with self-care tasks. Has adequate family support at home. OT is signing off on this pt.   Chatham Howington C, OT  Acute Rehabilitation Services Office [phone] Secure chat preferred   Mickael Alamo 12/22/2023, 3:56 PM

## 2023-12-23 ENCOUNTER — Ambulatory Visit: Payer: Self-pay | Admitting: Gastroenterology

## 2023-12-23 DIAGNOSIS — K838 Other specified diseases of biliary tract: Secondary | ICD-10-CM | POA: Diagnosis not present

## 2023-12-23 LAB — CBC
HCT: 35.3 % — ABNORMAL LOW (ref 36.0–46.0)
Hemoglobin: 11.8 g/dL — ABNORMAL LOW (ref 12.0–15.0)
MCH: 30.5 pg (ref 26.0–34.0)
MCHC: 33.4 g/dL (ref 30.0–36.0)
MCV: 91.2 fL (ref 80.0–100.0)
Platelets: 243 10*3/uL (ref 150–400)
RBC: 3.87 MIL/uL (ref 3.87–5.11)
RDW: 13.2 % (ref 11.5–15.5)
WBC: 6.7 10*3/uL (ref 4.0–10.5)
nRBC: 0 % (ref 0.0–0.2)

## 2023-12-23 LAB — COMPREHENSIVE METABOLIC PANEL WITH GFR
ALT: 284 U/L — ABNORMAL HIGH (ref 0–44)
AST: 160 U/L — ABNORMAL HIGH (ref 15–41)
Albumin: 3.3 g/dL — ABNORMAL LOW (ref 3.5–5.0)
Alkaline Phosphatase: 251 U/L — ABNORMAL HIGH (ref 38–126)
Anion gap: 10 (ref 5–15)
BUN: 9 mg/dL (ref 8–23)
CO2: 24 mmol/L (ref 22–32)
Calcium: 9.1 mg/dL (ref 8.9–10.3)
Chloride: 105 mmol/L (ref 98–111)
Creatinine, Ser: 1 mg/dL (ref 0.44–1.00)
GFR, Estimated: 56 mL/min — ABNORMAL LOW (ref >60)
Glucose, Bld: 118 mg/dL — ABNORMAL HIGH (ref 70–99)
Potassium: 3.5 mmol/L (ref 3.5–5.1)
Sodium: 139 mmol/L (ref 135–145)
Total Bilirubin: 0.8 mg/dL (ref 0.0–1.2)
Total Protein: 6 g/dL — ABNORMAL LOW (ref 6.5–8.1)

## 2023-12-23 MED ORDER — SUCRALFATE 1 G PO TABS: 1.0000 g | ORAL_TABLET | Freq: Two times a day (BID) | ORAL | 0 refills | Status: DC

## 2023-12-23 MED ORDER — ROSUVASTATIN CALCIUM 20 MG PO TABS: 10.0000 mg | ORAL_TABLET | Freq: Every day | ORAL | Status: DC

## 2023-12-23 MED ORDER — PANTOPRAZOLE SODIUM 40 MG PO TBEC: 40.0000 mg | DELAYED_RELEASE_TABLET | Freq: Every day | ORAL | 2 refills | Status: DC

## 2023-12-23 MED ORDER — ONDANSETRON HCL 4 MG/2ML IJ SOLN: 4.0000 mg | Freq: Four times a day (QID) | INTRAMUSCULAR | Status: DC | PRN

## 2023-12-23 MED ORDER — ONDANSETRON 4 MG PO TBDP: 4.0000 mg | ORAL_TABLET | Freq: Four times a day (QID) | ORAL | Status: DC | PRN

## 2023-12-23 NOTE — Plan of Care (Signed)

## 2023-12-23 NOTE — TOC CM/SW Note (Signed)
 Transition of Care [facility]) - Inpatient Brief Assessment   Patient Details  Name: LACRECIA DELVAL MRN: [id number] Date of Birth: March 07, 1941  Transition of Care [facility]) CM/SW Contact:    Jennett Model, RN Phone Number: 12/23/2023, 2:53 PM   Clinical Narrative: From home alone, has PCP and insurance on file, states has no HH services in place at this time or DME at home.  States grandson will transport them home at [employer] and family is support system, states gets medications from [facility] on [address].  Pta self ambulatory. Patient gives this NCM permission to speak with her son, her DIL and her grandson.  NCM called grandson to see who would transport her home, he states his mom who gets off work at 3 pm.     Transition of Care Asessment: Insurance and Status: Insurance coverage has been reviewed Patient has primary care physician: Yes Home environment has been reviewed: home alone Prior level of function:: indep Prior/Current Home Services: No current home services Social Drivers of Health Review: SDOH reviewed no interventions necessary Readmission risk has been reviewed: Yes Transition of care needs: no transition of care needs at this time

## 2023-12-23 NOTE — Discharge Summary (Signed)
 Physician Discharge Summary   Patient: Caroline Snyder MRN: [id number] DOB: 11/02/1940  Admit date:     12/18/2023  Discharge date: 12/23/23  Discharge Physician: Aneita Keens, MD   PCP: Verma Gobble, NP   Recommendations at discharge:  PCP, GI, medical oncology visits for hospital follow-up KUB in 10 to 14 days to assess pancreatic stent Plan for ERCP in 4 months per GI Monitor lung nodules although benign, complicated secondary to new diagnosis of  Discharge Diagnoses: Principal Problem:   Common bile duct dilation Active Problems:   Nausea and vomiting   Transaminitis   Elevated liver enzymes   Gastritis with hemorrhage   Common bile duct dilatation  Resolved Problems:   * No resolved hospital problems. *  Hospital Course: Caroline Snyder is a 83 y.o. female with a history of hypertension, hyperlipidemia, migraines, GERD.  Patient presented secondary to intractable nausea and vomiting with abdominal pain.  Initial imaging consistent with CBD dilation.  GI consulted.  MRCP obtained which was significant for evidence of possible pancreatic neoplasm in addition to moderate to severe intrahepatic ductal dilation.  Patient underwent ERCP which was significant for a common bile duct stricture which was malignant appearing; patient received 1 plastic biliary stent into the common bile duct in addition to 1 temporary plastic biliary stent into the ventral pancreatic duct.  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  Medical oncology was consulted and have arranged follow-up for outpatient.  GI will follow-up as an outpatient; patient will need KUB in 10 to 14 days to assess if pancreatic duct stent is still present versus migrated.  Also plan for repeat ERCP in 4 months.  Tolerating a diet prior to discharge  Assessment and Plan:  Epigastric abdominal pain Nausea/vomiting Likely relating to CT findings  concerning for pancreatic neoplasm with bile duct dilation.  Symptoms have improved since admission.  Patient is tolerating solid diet prior to discharge.   Elevated AST/ALT Hyperbilirubinemia Cholangiocarcinoma Right upper quadrant ultrasound obtained on admission which was significant for CBD dilation measuring up to 9 mm.  MRCP significant for moderate to severe intrahepatic bile duct dilation with concern for stricture at the level of the head of pancreas with concern for possible pancreatic head neoplasm.  Gastroenterology consulted and performed an ERCP on 5/21, placing a CBD stent and pancreatic stent; biopsies obtained and pending. Bilirubin and AST/ALT improved status post stent.  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 Patient to follow-up with medical oncology and GI as an outpatient.   Back pain Chest pain Unclear etiology. Reproducible back pain indicates possible musculoskeletal etiology. Chest pain is vague and unclear from patient's description.  CT chest was obtained and was insignificant for any acute findings.  Prior to discharge.  Multiple lung nodules Noted CT imaging of the chest.  Likely benign per radiology read.  Complicated by recommendation for observation of these nodules   Anemia Acute.  Mild.  No evidence of acute hemorrhaging at this time.  Stable.   Primary hypertension Patient is on losartan  and hydrochlorothiazide  as an outpatient.  Hydrochlorothiazide  was resumed on admission but will discontinue on discharge and continue losartan  on discharge.   GERD -Continue Protonix    Hyperlipidemia Continue Crestor  but decrease to 10 mg daily based off creatinine clearance   Consultants:  [facility]   Procedures:  ERCP  Disposition: Home Diet recommendation: Regular diet   DISCHARGE MEDICATION: Allergies as of 12/23/2023  Reactions   Aricept  [donepezil ] Diarrhea   Vioxx  [rofecoxib] Nausea Only   Pt. Can't remember what reaction was but asks to leave on her profile.         Medication List     STOP taking these medications    hydrochlorothiazide  25 MG tablet Commonly known as: HYDRODIURIL        TAKE these medications    acetaminophen  500 MG tablet Commonly known as: TYLENOL  Take 500 mg by mouth every 6 (six) hours as needed for mild pain (pain score 1-3) or headache.   aspirin  EC 81 MG tablet Take 1 tablet (81 mg total) by mouth daily. Swallow whole. What changed: when to take this   Calcium -Vitamin D3 600-12.5 MG-MCG Caps Take 2 capsules by mouth every evening.   Ensure Active High Protein Liqd Take 1 Bottle by mouth daily. What changed:  when to take this reasons to take this   loperamide  2 MG tablet Commonly known as: Imodium  A-D Take 1 tablet (2 mg total) by mouth 4 (four) times daily as needed for diarrhea or loose stools.   losartan  100 MG tablet Commonly known as: COZAAR  Take 1 tablet (100 mg total) by mouth daily. Essential hypertension I10   ondansetron  4 MG tablet Commonly known as: Zofran  Take 1 tablet (4 mg total) by mouth every 8 (eight) hours as needed for nausea or vomiting.   pantoprazole  40 MG tablet Commonly known as: Protonix  Take 1 tablet (40 mg total) by mouth daily.   PreserVision AREDS 2 Caps Take 1 capsule by mouth daily.   MULTIVITAMIN GUMMIES WOMENS PO Take 2 capsules by mouth daily. Smarty Pants Gummies   rosuvastatin  20 MG tablet Commonly known as: CRESTOR  Take 0.5 tablets (10 mg total) by mouth daily. What changed: how much to take   sucralfate 1 g tablet Commonly known as: CARAFATE Take 1 tablet (1 g total) by mouth 2 (two) times daily.        Follow-up Information     Verma Gobble, NP. Schedule an appointment as soon as possible for a visit in 1 week(s).   Specialty: Geriatric Medicine Why: For hospital follow-up Contact information: [address] [place]  [address] [phone]         Arlo Berber, MD Follow up on 12/29/2023.   Specialty: Oncology Why: 10:30 AM, For hospital follow-up Contact information: [address] [place] [address] [phone]         Mansouraty, Albino Alu., MD. Schedule an appointment as soon as possible for a visit in 2 week(s).   Specialties: Gastroenterology, Internal Medicine Why: For hospital follow-up, imaging. Contact information: [address] [place] [address] [phone]                Discharge Exam: BP (!) 110/50 (BP Location: Left Arm)   Pulse 60   Temp 98.2 F (36.8 C)   Resp 15   Ht 5' (1.524 m)   Wt 44.5 kg   SpO2 96%   BMI 19.16 kg/m   General exam: Appears calm and comfortable Respiratory system: Clear to auscultation. Respiratory effort normal. Cardiovascular system: S1 & S2 heard, RRR. No murmurs, rubs, gallops or clicks. Gastrointestinal system: Abdomen is nondistended, soft and nontender. Normal bowel sounds heard. Central nervous system: Alert and oriented. No focal neurological deficits. Psychiatry: Judgement and insight appear normal. Mood & affect appropriate.   Condition at discharge: stable  The results of significant diagnostics from this hospitalization (including imaging, microbiology,  ancillary and laboratory) are listed below for reference.   Imaging Studies: CT CHEST W CONTRAST Result Date: 12/23/2023 CLINICAL DATA:  Pancreatic duct stricture raising concern for pancreatic neoplasm. Evaluate for metastatic disease. * Tracking Code: BO * EXAM: CT CHEST WITH CONTRAST TECHNIQUE: Multidetector CT imaging of the chest was performed during intravenous contrast administration. RADIATION DOSE REDUCTION: This exam was performed according to the departmental dose-optimization program which includes automated exposure control, adjustment of the mA and/or kV according to patient size and/or use of iterative reconstruction technique. CONTRAST:  50mL  OMNIPAQUE  IOHEXOL  350 MG/ML SOLN COMPARISON:  02/07/2014 FINDINGS: Cardiovascular: The heart size is normal. No substantial pericardial effusion. Coronary artery calcification is evident. Mild atherosclerotic calcification is noted in the wall of the thoracic aorta. Mediastinum/Nodes: No mediastinal lymphadenopathy. There is no hilar lymphadenopathy. The esophagus has normal imaging features. There is no axillary lymphadenopathy. Lungs/Pleura: Biapical pleuroparenchymal scarring evident. Several very tiny scattered bilateral pulmonary nodules are identified, including 2 mm left upper lobe nodule seen on 46/4 and 2 mm paraspinal right lower lobe on 01/20 5/4. No suspicious pulmonary nodule or mass. Subsegmental atelectasis or linear scarring noted in the lingula, right middle lobe and left lower lobe. No focal airspace consolidation. No pleural effusion. Upper Abdomen: Pneumobilia identified in the incompletely visualized liver, presumably secondary to biliary stent placement on 12/21/2023. Musculoskeletal: No worrisome lytic or sclerotic osseous abnormality. IMPRESSION: 1. No convincing evidence for metastatic disease in the chest. 2. Several very tiny scattered bilateral pulmonary nodules, likely benign. However, given patient history, surveillance warranted. 3. Pneumobilia in the incompletely visualized liver, presumably secondary to biliary stent placement on 12/21/2023. 4.  Aortic Atherosclerosis (ICD10-I70.0). Electronically Signed   By: Donnal Fusi M.D.   On: 12/23/2023 05:27   DG ERCP Result Date: 12/22/2023 CLINICAL DATA:  [id number] Elective surgery [id number] EXAM: ERCP COMPARISON:  CT AP, 12/11/2023.  MRCP, 12/18/2023. FLUOROSCOPY: Radiation Exposure Index and estimated peak skin dose (PSD); Reference air kerma (RAK), 43.1 mGy. FINDINGS: Limited oblique planar images of the RIGHT upper quadrant obtained C-arm. Images demonstrating flexible endoscopy, biliary duct cannulation, sphincterotomy, retrograde  cholangiogram, pancreatic duct and common bile duct stent placement. Distal common bile duct shouldered tapering. No discrete evidence of biliary filling defect is demonstrated. IMPRESSION: Fluoroscopic imaging for ERCP, pancreatic duct and common bile duct stent placement. For complete description of intra procedural findings, please see performing service dictation. Electronically Signed   By: Art Largo M.D.   On: 12/22/2023 18:16   DG C-Arm 1-60 Min-No Report Result Date: 12/21/2023 Fluoroscopy was utilized by the requesting physician.  No radiographic interpretation.   MR ABDOMEN MRCP W WO CONTAST Result Date: 12/19/2023 CLINICAL DATA:  Evaluate etiology of biliary obstruction. Dilated common bile duct measuring 9 mm. EXAM: MRI ABDOMEN WITHOUT AND WITH CONTRAST (INCLUDING MRCP) TECHNIQUE: Multiplanar multisequence MR imaging of the abdomen was performed both before and after the administration of intravenous contrast. Heavily T2-weighted images of the biliary and pancreatic ducts were obtained, and three-dimensional MRCP images were rendered by post processing. CONTRAST:  5mL GADAVIST  GADOBUTROL  1 MMOL/ML IV SOLN COMPARISON:  Right upper quadrant sonogram 12/18/2023 FINDINGS: Lower chest: No acute findings. Hepatobiliary: No focal enhancing liver lesions identified. The gallbladder is decompressed. No pericholecystic inflammatory change. The common bile duct is dilated measuring 1.2 cm. Moderate to severe intrahepatic bile duct dilatation. No signs of choledocholithiasis. There is abrupt beak like narrowing of the mid common bile duct at the level of the head of pancreas, image 8/4. Pancreas:  Mild diffuse edema within the pancreatic parenchyma noted. Mild increase caliber of the main pancreatic duct which measures up to 4 mm at the level of the pancreatic neck, which abruptly decreases in caliber at the head of pancreas. Focal area mild increased T2 signal with restricted diffusion in the anterior head  of pancreas measuring 2.2 x 1.2 by 2.2 cm, image 75/6 and image 19/3. There are areas of restricted diffusion identified within the head of pancreas Spleen:  Within normal limits in size and appearance. Adrenals/Urinary Tract: No masses identified. No evidence of hydronephrosis. Stomach/Bowel: Visualized portions within the abdomen are unremarkable. Vascular/Lymphatic: Aortic atherosclerosis. No adenopathy identified. Other:  No ascites or focal fluid collections. Musculoskeletal: No suspicious bone lesions identified. IMPRESSION: 1. The gallbladder is now decompressed. No signs of choledocholithiasis. 2. Moderate to severe intrahepatic bile duct dilatation. There is abrupt beak like narrowing of the mid common bile duct at the level of the head of pancreas. Findings are concerning for a stricture at the level of the head of pancreas. 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 3. Mild diffuse edema within the pancreatic parenchyma noted, which is concerning for pancreatitis. No signs of pancreatic necrosis or pseudocyst formation. 4. Mild increase caliber of the main pancreatic duct which measures up to 4 mm at the level of the pancreatic neck, which abruptly decreases in caliber at the head of pancreas. Focal area of mild increased T2 signal with restricted diffusion in the anterior head of pancreas measures 2.2 x 1.2 by 2.2 cm. Cannot exclude pancreatic head neoplasm. Consider further evaluation with endoscopic ultrasound and tissue sampling. 5. Aortic Atherosclerosis (ICD10-I70.0). Electronically Signed   By: Kimberley Penman M.D.   On: 12/19/2023 05:18   MR 3D Recon At Scanner Result Date: 12/19/2023 CLINICAL DATA:  Evaluate etiology of biliary obstruction. Dilated common bile duct measuring 9 mm. EXAM: MRI ABDOMEN WITHOUT AND WITH CONTRAST (INCLUDING MRCP) TECHNIQUE: Multiplanar multisequence MR  imaging of the abdomen was performed both before and after the administration of intravenous contrast. Heavily T2-weighted images of the biliary and pancreatic ducts were obtained, and three-dimensional MRCP images were rendered by post processing. CONTRAST:  5mL GADAVIST  GADOBUTROL  1 MMOL/ML IV SOLN COMPARISON:  Right upper quadrant sonogram 12/18/2023 FINDINGS: Lower chest: No acute findings. Hepatobiliary: No focal enhancing liver lesions identified. The gallbladder is decompressed. No pericholecystic inflammatory change. The common bile duct is dilated measuring 1.2 cm. Moderate to severe intrahepatic bile duct dilatation. No signs of choledocholithiasis. There is abrupt beak like narrowing of the mid common bile duct at the level of the head of pancreas, image 8/4. Pancreas: Mild diffuse edema within the pancreatic parenchyma noted. Mild increase caliber of the main pancreatic duct which measures up to 4 mm at the level of the pancreatic neck, which abruptly decreases in caliber at the head of pancreas. Focal area mild increased T2 signal with restricted diffusion in the anterior head of pancreas measuring 2.2 x 1.2 by 2.2 cm, image 75/6 and image 19/3. There are areas of restricted diffusion identified within the head of pancreas Spleen:  Within normal limits in size and appearance. Adrenals/Urinary Tract: No masses identified. No evidence of hydronephrosis. Stomach/Bowel: Visualized portions within the abdomen are unremarkable. Vascular/Lymphatic: Aortic atherosclerosis. No adenopathy identified. Other:  No ascites or focal fluid collections. Musculoskeletal: No suspicious bone lesions identified. IMPRESSION: 1. The gallbladder is now decompressed. No signs of choledocholithiasis. 2.  Moderate to severe intrahepatic bile duct dilatation. There is abrupt beak like narrowing of the mid common bile duct at the level of the head of pancreas. Findings are concerning for a stricture at the level of the head of  pancreas. 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 3. Mild diffuse edema within the pancreatic parenchyma noted, which is concerning for pancreatitis. No signs of pancreatic necrosis or pseudocyst formation. 4. Mild increase caliber of the main pancreatic duct which measures up to 4 mm at the level of the pancreatic neck, which abruptly decreases in caliber at the head of pancreas. Focal area of mild increased T2 signal with restricted diffusion in the anterior head of pancreas measures 2.2 x 1.2 by 2.2 cm. Cannot exclude pancreatic head neoplasm. Consider further evaluation with endoscopic ultrasound and tissue sampling. 5. Aortic Atherosclerosis (ICD10-I70.0). Electronically Signed   By: Kimberley Penman M.D.   On: 12/19/2023 05:18   DG Shoulder Left Result Date: 12/18/2023 CLINICAL DATA:  Pain. EXAM: LEFT SHOULDER - 2+ VIEW COMPARISON:  None Available. FINDINGS: There is no evidence of fracture or dislocation. Minor acromioclavicular spurring. Glenohumeral joint space is preserved with trace inferior glenoid spurring. No erosions or suspicious bone lesions. Soft tissues are unremarkable. IMPRESSION: Minor acromioclavicular and glenohumeral osteoarthritis. Electronically Signed   By: Chadwick Colonel M.D.   On: 12/18/2023 15:21   US  Abdomen Limited RUQ (LIVER/GB) Result Date: 12/18/2023 CLINICAL DATA:  Right upper quadrant pain skip EXAM: ULTRASOUND ABDOMEN LIMITED RIGHT UPPER QUADRANT COMPARISON:  None Available. FINDINGS: Gallbladder: No gallstones or wall thickening visualized. No sonographic Murphy sign noted by sonographer. Common bile duct: Diameter: 6 mm-9 mm in left lateral decubitus. No intrahepatic biliary ductal dilatation. Liver: No focal lesion identified. Within normal limits in parenchymal echogenicity. Portal vein is patent on color Doppler imaging with normal direction of blood  flow towards the liver. Other: None. IMPRESSION: 1. No cholelithiasis or sonographic evidence for acute cholecystitis. 2. Common bile duct is mildly dilated measuring up to 9 mm. Recommend correlation with LFTs. This can be further evaluated with ERCP or MRCP. Electronically Signed   By: Tyron Gallon M.D.   On: 12/18/2023 15:05    Microbiology: Results for orders placed or performed during the hospital encounter of 12/18/23  Urine Culture     Status: Abnormal   Collection Time: 12/18/23  1:56 PM   Specimen: Urine, Clean Catch  Result Value Ref Range Status   Specimen Description URINE, CLEAN CATCH  Final   Special Requests NONE  Final   Culture (A)  Final    <10,000 COLONIES/mL INSIGNIFICANT GROWTH Performed at [facility], [address], [place], [place] [address]    Report Status 12/19/2023 FINAL  Final    Labs: CBC: Recent Labs  Lab 12/18/23 1255 12/19/23 0811 12/20/23 0857 12/21/23 0603 12/22/23 0642 12/23/23 0741  WBC 5.3 4.4 4.0 4.5 6.8 6.7  NEUTROABS 3.1  --   --   --   --   --   HGB 12.2 11.7* 11.2* 11.4* 11.3* 11.8*  HCT 37.4 35.4* 33.5* 33.9* 33.4* 35.3*  MCV 94.4 91.5 92.0 91.1 90.8 91.2  PLT 260 243 228 213 245 243   Basic Metabolic Panel: Recent Labs  Lab 12/19/23 0811 12/20/23 0857 12/21/23 0603 12/22/23 0642 12/23/23 0741  NA 137 140 139 136 139  K 3.8 3.9 3.6 3.7 3.5  CL 108 109  109 107 105  CO2 20* 24 24 20* 24  GLUCOSE 94 94 125* 131* 118*  BUN 9 8 6* 10 9  CREATININE 0.86 0.91 0.81 0.97 1.00  CALCIUM  8.9 8.7* 8.6* 8.5* 9.1   Liver Function Tests: Recent Labs  Lab 12/19/23 0811 12/20/23 0857 12/21/23 0603 12/22/23 0642 12/23/23 0741  AST 306* 280* 337* 221* 160*  ALT 367* 316* 341* 326* 284*  ALKPHOS 315* 284* 284* 262* 251*  BILITOT 1.7* 2.6* 1.8* 1.0 0.8  PROT 6.2* 5.7* 5.7* 5.8* 6.0*  ALBUMIN 3.4* 3.1* 3.1* 3.1* 3.3*    Discharge time spent: 35 minutes.  Signed: Aneita Keens, MD [facility] 12/23/2023

## 2023-12-23 NOTE — TOC Transition Note (Signed)
 Transition of Care [facility]) - Discharge Note   Patient Details  Name: Caroline Snyder MRN: [id number] Date of Birth: 1941/04/29  [facility] CM/SW Contact:  Jennett Model, RN Phone Number: 12/23/2023, 2:57 PM   Clinical Narrative:    For dc today, DIL will transport her home when she gets off work at 3 pm.           Patient Goals and CMS Choice            Discharge Placement                       Discharge Plan and Services Additional resources added to the After Visit Summary for                                       Social Drivers of Health (SDOH) Interventions SDOH Screenings   Food Insecurity: No Food Insecurity (12/18/2023)  Housing: Low Risk  (12/18/2023)  Transportation Needs: No Transportation Needs (12/18/2023)  Utilities: Not At Risk (12/18/2023)  Depression (PHQ2-9): Low Risk  (12/13/2023)  Financial Resource Strain: Low Risk  (06/09/2018)  Physical Activity: Insufficiently Active (06/09/2018)  Social Connections: Moderately Isolated (12/18/2023)  Stress: No Stress Concern Present (06/09/2018)  Tobacco Use: Low Risk  (12/21/2023)     Readmission Risk Interventions    12/23/2023    2:52 PM  Readmission Risk Prevention Plan  Post Dischage Appt Complete  Medication Screening Complete  Transportation Screening Complete

## 2023-12-23 NOTE — Anesthesia Postprocedure Evaluation (Signed)
 Anesthesia Post Note  Patient: Caroline Snyder  Procedure(s) Performed: ULTRASOUND, UPPER GI TRACT, ENDOSCOPIC ERCP, WITH INTERVENTION IF INDICATED     Patient location during evaluation: PACU Anesthesia Type: General Level of consciousness: awake and alert Pain management: pain level controlled Vital Signs Assessment: post-procedure vital signs reviewed and stable Respiratory status: spontaneous breathing, nonlabored ventilation, respiratory function stable and patient connected to nasal cannula oxygen Cardiovascular status: blood pressure returned to baseline and stable Postop Assessment: no apparent nausea or vomiting Anesthetic complications: no   No notable events documented.  Last Vitals:  Vitals:   12/22/23 1951 12/23/23 0412  BP: (!) 148/74 (!) 110/50  Pulse: 66 60  Resp: 16 15  Temp: 36.6 C 36.8 C  SpO2: 98% 96%    Last Pain:  Vitals:   12/22/23 2239  TempSrc:   PainSc: Rutherford Cowing

## 2023-12-23 NOTE — Discharge Instructions (Addendum)
 Caroline Snyder,  You were admitted with nausea and vomiting and was unfortunately found to have cancer in your abdomen.  This was diagnosed via biopsy.  You have been set up with outpatient GI and oncology follow-up; you will need repeat imaging of your abdomen in about 2 weeks to follow-up on the pancreatic stent that was placed.  Please use Zofran  as needed to manage your nausea.  Please continue medications as recommended.  It is a pleasure meeting you and your family.

## 2023-12-25 ENCOUNTER — Encounter (HOSPITAL_COMMUNITY): Payer: Self-pay | Admitting: Gastroenterology

## 2023-12-26 ENCOUNTER — Ambulatory Visit: Payer: Self-pay

## 2023-12-26 ENCOUNTER — Emergency Department (HOSPITAL_BASED_OUTPATIENT_CLINIC_OR_DEPARTMENT_OTHER)
Admission: EM | Admit: 2023-12-26 | Discharge: 2023-12-26 | Disposition: A | Discharge status: Home / Self Care | Payer: Medicare (Managed Care) | Attending: Emergency Medicine | Admitting: Emergency Medicine

## 2023-12-26 ENCOUNTER — Other Ambulatory Visit: Payer: Self-pay

## 2023-12-26 ENCOUNTER — Emergency Department (HOSPITAL_BASED_OUTPATIENT_CLINIC_OR_DEPARTMENT_OTHER): Payer: Medicare (Managed Care)

## 2023-12-26 ENCOUNTER — Encounter (HOSPITAL_BASED_OUTPATIENT_CLINIC_OR_DEPARTMENT_OTHER): Payer: Self-pay | Admitting: Emergency Medicine

## 2023-12-26 DIAGNOSIS — R7401 Elevation of levels of liver transaminase levels: Secondary | ICD-10-CM | POA: Diagnosis not present

## 2023-12-26 DIAGNOSIS — Z7982 Long term (current) use of aspirin: Secondary | ICD-10-CM | POA: Diagnosis not present

## 2023-12-26 DIAGNOSIS — D72829 Elevated white blood cell count, unspecified: Secondary | ICD-10-CM | POA: Insufficient documentation

## 2023-12-26 DIAGNOSIS — K6289 Other specified diseases of anus and rectum: Secondary | ICD-10-CM | POA: Diagnosis present

## 2023-12-26 DIAGNOSIS — K5641 Fecal impaction: Secondary | ICD-10-CM | POA: Insufficient documentation

## 2023-12-26 LAB — CBC
HCT: 38.7 % (ref 36.0–46.0)
Hemoglobin: 13 g/dL (ref 12.0–15.0)
MCH: 30.4 pg (ref 26.0–34.0)
MCHC: 33.6 g/dL (ref 30.0–36.0)
MCV: 90.6 fL (ref 80.0–100.0)
Platelets: 346 10*3/uL (ref 150–400)
RBC: 4.27 MIL/uL (ref 3.87–5.11)
RDW: 12.7 % (ref 11.5–15.5)
WBC: 11.1 10*3/uL — ABNORMAL HIGH (ref 4.0–10.5)
nRBC: 0 % (ref 0.0–0.2)

## 2023-12-26 LAB — COMPREHENSIVE METABOLIC PANEL WITH GFR
ALT: 278 U/L — ABNORMAL HIGH (ref 0–44)
AST: 129 U/L — ABNORMAL HIGH (ref 15–41)
Albumin: 4.4 g/dL (ref 3.5–5.0)
Alkaline Phosphatase: 245 U/L — ABNORMAL HIGH (ref 38–126)
Anion gap: 13 (ref 5–15)
BUN: 23 mg/dL (ref 8–23)
CO2: 26 mmol/L (ref 22–32)
Calcium: 9.9 mg/dL (ref 8.9–10.3)
Chloride: 95 mmol/L — ABNORMAL LOW (ref 98–111)
Creatinine, Ser: 0.98 mg/dL (ref 0.44–1.00)
GFR, Estimated: 57 mL/min — ABNORMAL LOW (ref >60)
Glucose, Bld: 111 mg/dL — ABNORMAL HIGH (ref 70–99)
Potassium: 4.4 mmol/L (ref 3.5–5.1)
Sodium: 134 mmol/L — ABNORMAL LOW (ref 135–145)
Total Bilirubin: 0.4 mg/dL (ref 0.0–1.2)
Total Protein: 7.3 g/dL (ref 6.5–8.1)

## 2023-12-26 LAB — LIPASE, BLOOD: Lipase: 43 U/L (ref 11–51)

## 2023-12-26 NOTE — ED Triage Notes (Signed)
 Pt POV with son- pt c/o possible constipation. Reports outpt surgery Thursday, started stool softener yesterday, took couple suppositories a couple hours PTA today, and a serving of miralax. C/o pain at rectum when trying to have BM.   Recently d/c 12/23/23  Son reports pt not having BM in appx 1 week.   Pt took tylenol  this AM.

## 2023-12-26 NOTE — ED Provider Notes (Signed)
 [place] EMERGENCY DEPARTMENT AT [facility] Provider Note   CSN: [id number] Arrival date & time: 12/26/23  1814     History {Add pertinent medical, surgical, social history, OB history to HPI:1} Chief Complaint  Patient presents with   Rectal Pain   Constipation    Caroline Snyder is a 83 y.o. female.  She is brought in by family for rectal pain for 2 days.  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  Underwent stenting.  Tried stool softener yesterday and some MiraLAX today.  Passing some small balls of stool and significant rectal pain.  No abdominal pain.  Still has a little nausea.  The history is provided by the patient and a relative.  Constipation Chronicity:  New Context: dehydration, dietary changes, medication and stress   Stool description:  Pellet like Relieved by:  Nothing Associated symptoms: nausea   Associated symptoms: no abdominal pain, no diarrhea, no fever and no hematochezia        Home Medications Prior to Admission medications   Medication Sig Start Date End Date Taking? Authorizing Provider  acetaminophen  (TYLENOL ) 500 MG tablet Take 500 mg by mouth every 6 (six) hours as needed for mild pain (pain score 1-3) or headache.    [provider]  aspirin  EC 81 MG tablet Take 1 tablet (81 mg total) by mouth daily. Swallow whole. Patient taking differently: Take 81 mg by mouth in the morning. Swallow whole. 10/31/20   Verma Gobble, NP  Calcium  Carb-Cholecalciferol  (CALCIUM -VITAMIN D3) 600-12.5 MG-MCG CAPS Take 2 capsules by mouth every evening.    [provider]  loperamide  (IMODIUM  A-D) 2 MG tablet Take 1 tablet (2 mg total) by mouth 4 (four) times daily as needed for diarrhea or loose stools. 12/13/23   [name], Dinah C, NP  losartan  (COZAAR ) 100 MG tablet Take 1 tablet (100 mg total) by mouth daily. Essential hypertension I10 04/29/23   Verma Gobble, NP  Multiple Vitamins-Minerals (MULTIVITAMIN GUMMIES WOMENS PO) Take 2 capsules by mouth daily. Smarty Pants Gummies    [provider]  Multiple Vitamins-Minerals (PRESERVISION AREDS 2) CAPS Take 1 capsule by mouth daily.    [provider]  Nutritional Supplements (ENSURE ACTIVE HIGH PROTEIN) LIQD Take 1 Bottle by mouth daily. Patient taking differently: Take 1 Bottle by mouth as needed. 09/06/23   [name], Dinah C, NP  ondansetron  (ZOFRAN ) 4 MG tablet Take 1 tablet (4 mg total) by mouth every 8 (eight) hours as needed for nausea or vomiting. 12/13/23   [name], Dinah C, NP  pantoprazole  (PROTONIX ) 40 MG tablet Take 1 tablet (40 mg total) by mouth daily. 12/23/23   Verlyn Goad, MD  rosuvastatin  (CRESTOR ) 20 MG tablet Take 0.5 tablets (10 mg total) by mouth daily. 12/23/23   Verlyn Goad, MD  sucralfate (CARAFATE) 1 g tablet Take 1 tablet (1 g total) by mouth 2 (two) times daily. 12/23/23 01/22/24  Verlyn Goad, MD      Allergies    Aricept  [donepezil ] and Vioxx [rofecoxib]    Review of Systems   Review of Systems  Constitutional:  Negative for fever.  Gastrointestinal:  Positive for constipation and nausea. Negative for abdominal pain, diarrhea and hematochezia.    Physical Exam Updated Vital Signs BP 137/69   Pulse 68   Temp 98 F (36.7 C) (Oral)   Resp 18   Ht 5' (1.524 m)  Wt 44 kg   SpO2 98%   BMI 18.94 kg/m  Physical Exam Vitals and nursing note reviewed.  Constitutional:      General: She is not in acute distress.    Appearance: Normal appearance. She is well-developed.  HENT:     Head: Normocephalic and atraumatic.  Eyes:     Conjunctiva/sclera: Conjunctivae normal.  Cardiovascular:     Rate and Rhythm: Normal rate and regular rhythm.     Heart sounds: No murmur heard. Pulmonary:     Effort: Pulmonary effort is normal. No respiratory distress.     Breath sounds: Normal breath sounds. No stridor. No wheezing.  Abdominal:      Palpations: Abdomen is soft.     Tenderness: There is no abdominal tenderness. There is no guarding or rebound.  Musculoskeletal:        General: No deformity.     Cervical back: Neck supple.  Skin:    General: Skin is warm and dry.  Neurological:     General: No focal deficit present.     Mental Status: She is alert.     GCS: GCS eye subscore is 4. GCS verbal subscore is 5. GCS motor subscore is 6.     ED Results / Procedures / Treatments   Labs (all labs ordered are listed, but only abnormal results are displayed) Labs Reviewed  LIPASE, BLOOD  COMPREHENSIVE METABOLIC PANEL WITH GFR  CBC  URINALYSIS, ROUTINE W REFLEX MICROSCOPIC    EKG None  Radiology No results found.  Procedures .Fecal disimpaction  Date/Time: 12/26/2023 7:45 PM  Performed by: Tonya Fredrickson, MD Authorized by: Tonya Fredrickson, MD  Consent: Verbal consent obtained. Consent given by: patient Patient understanding: patient states understanding of the procedure being performed Patient identity confirmed: verbally with patient Local anesthesia used: no  Anesthesia: Local anesthesia used: no  Sedation: Patient sedated: no  Patient tolerance: patient tolerated the procedure well with no immediate complications     {Document cardiac monitor, telemetry assessment procedure when appropriate:1}  Medications Ordered in ED Medications - No data to display  ED Course/ Medical Decision Making/ A&P Clinical Course as of 12/26/23 1945  Mon Dec 26, 2023  1610 Did a Energy manager disimpaction with nurse chaperone.  Brown stool hard.  Will try an enema now [MB]    Clinical Course User Index [MB] Tonya Fredrickson, MD   {   Click here for ABCD2, HEART and other calculatorsREFRESH Note before signing :1}                              Medical Decision Making Amount and/or Complexity of Data Reviewed Labs: ordered. Radiology: ordered.   This patient complains of ***; this involves an extensive number  of treatment Options and is a complaint that carries with it a high risk of complications and morbidity. The differential includes ***  I ordered, reviewed and interpreted labs, which included *** I ordered medication *** and reviewed PMP when indicated. I ordered imaging studies which included *** and I independently    visualized and interpreted imaging which showed *** Additional history obtained from *** Previous records obtained and reviewed *** I consulted *** and discussed lab and imaging findings and discussed disposition.  Cardiac monitoring reviewed, *** Social determinants considered, *** Critical Interventions: ***  After the interventions stated above, I reevaluated the patient and found *** Admission and further testing considered, ***   {Document critical  care time when appropriate:1} {Document review of labs and clinical decision tools ie heart score, Chads2Vasc2 etc:1}  {Document your independent review of radiology images, and any outside records:1} {Document your discussion with family members, caretakers, and with consultants:1} {Document social determinants of health affecting pt's care:1} {Document your decision making why or why not admission, treatments were needed:1} Final Clinical Impression(s) / ED Diagnoses Final diagnoses:  None    Rx / DC Orders ED Discharge Orders     None

## 2023-12-26 NOTE — ED Notes (Signed)
 Pt had medium BM after enema. Reports feeling significantly better with less pain

## 2023-12-27 ENCOUNTER — Other Ambulatory Visit: Payer: Self-pay

## 2023-12-27 LAB — SURGICAL PATHOLOGY

## 2023-12-27 MED ORDER — TALICIA 250-12.5-10 MG PO CPDR: 4.0000 | DELAYED_RELEASE_CAPSULE | Freq: Three times a day (TID) | ORAL | 0 refills | Status: DC

## 2023-12-27 NOTE — Telephone Encounter (Signed)
 Nicki,PT daughter in law is returning call to find out the status of the appointment that is to be set up two weeks from now. Please call at [phone] to further discuss

## 2023-12-27 NOTE — Telephone Encounter (Signed)
 The pt daughter has been advised that no appt is needed she can walk in for Xray in 2 weeks. The pt has been advised of the information and verbalized understanding.

## 2023-12-28 ENCOUNTER — Encounter: Admitting: Family

## 2023-12-28 NOTE — Telephone Encounter (Addendum)
 Inbound call from daughter in law Nicki, wants clarification on medications about h pylori and also test results.

## 2023-12-28 NOTE — Telephone Encounter (Signed)
 See results note 5/27

## 2023-12-29 ENCOUNTER — Inpatient Hospital Stay: Payer: Medicare (Managed Care)

## 2023-12-29 ENCOUNTER — Encounter: Payer: Self-pay | Admitting: Oncology

## 2023-12-29 ENCOUNTER — Inpatient Hospital Stay: Payer: Medicare (Managed Care) | Attending: Oncology | Admitting: Oncology

## 2023-12-29 ENCOUNTER — Other Ambulatory Visit: Payer: Self-pay

## 2023-12-29 ENCOUNTER — Emergency Department (HOSPITAL_COMMUNITY)
Admission: EM | Admit: 2023-12-29 | Discharge: 2023-12-29 | Disposition: A | Discharge status: Home / Self Care | Payer: Medicare (Managed Care) | Source: Home / Self Care | Attending: Emergency Medicine | Admitting: Emergency Medicine

## 2023-12-29 ENCOUNTER — Emergency Department (HOSPITAL_COMMUNITY): Payer: Medicare (Managed Care)

## 2023-12-29 ENCOUNTER — Encounter (HOSPITAL_COMMUNITY): Payer: Self-pay

## 2023-12-29 ENCOUNTER — Other Ambulatory Visit

## 2023-12-29 VITALS — BP 142/76 | HR 68 | Temp 97.6°F | Resp 13 | Wt 95.4 lb

## 2023-12-29 DIAGNOSIS — N644 Mastodynia: Secondary | ICD-10-CM | POA: Insufficient documentation

## 2023-12-29 DIAGNOSIS — B9681 Helicobacter pylori [H. pylori] as the cause of diseases classified elsewhere: Secondary | ICD-10-CM | POA: Insufficient documentation

## 2023-12-29 DIAGNOSIS — Z79899 Other long term (current) drug therapy: Secondary | ICD-10-CM | POA: Diagnosis not present

## 2023-12-29 DIAGNOSIS — C221 Intrahepatic bile duct carcinoma: Secondary | ICD-10-CM | POA: Diagnosis not present

## 2023-12-29 DIAGNOSIS — I10 Essential (primary) hypertension: Secondary | ICD-10-CM | POA: Insufficient documentation

## 2023-12-29 DIAGNOSIS — K5900 Constipation, unspecified: Secondary | ICD-10-CM | POA: Diagnosis not present

## 2023-12-29 DIAGNOSIS — C240 Malignant neoplasm of extrahepatic bile duct: Secondary | ICD-10-CM

## 2023-12-29 DIAGNOSIS — Z7982 Long term (current) use of aspirin: Secondary | ICD-10-CM | POA: Insufficient documentation

## 2023-12-29 DIAGNOSIS — E785 Hyperlipidemia, unspecified: Secondary | ICD-10-CM | POA: Diagnosis not present

## 2023-12-29 DIAGNOSIS — R1011 Right upper quadrant pain: Secondary | ICD-10-CM | POA: Diagnosis not present

## 2023-12-29 DIAGNOSIS — A048 Other specified bacterial intestinal infections: Secondary | ICD-10-CM | POA: Insufficient documentation

## 2023-12-29 DIAGNOSIS — R109 Unspecified abdominal pain: Secondary | ICD-10-CM

## 2023-12-29 LAB — CBC WITH DIFFERENTIAL/PLATELET
Abs Immature Granulocytes: 0.03 10*3/uL (ref 0.00–0.07)
Basophils Absolute: 0.1 10*3/uL (ref 0.0–0.1)
Basophils Relative: 1 %
Eosinophils Absolute: 0.1 10*3/uL (ref 0.0–0.5)
Eosinophils Relative: 2 %
HCT: 38.9 % (ref 36.0–46.0)
Hemoglobin: 12.9 g/dL (ref 12.0–15.0)
Immature Granulocytes: 1 %
Lymphocytes Relative: 19 %
Lymphs Abs: 1.1 10*3/uL (ref 0.7–4.0)
MCH: 30.8 pg (ref 26.0–34.0)
MCHC: 33.2 g/dL (ref 30.0–36.0)
MCV: 92.8 fL (ref 80.0–100.0)
Monocytes Absolute: 0.5 10*3/uL (ref 0.1–1.0)
Monocytes Relative: 8 %
Neutro Abs: 4.1 10*3/uL (ref 1.7–7.7)
Neutrophils Relative %: 69 %
Platelets: 309 10*3/uL (ref 150–400)
RBC: 4.19 MIL/uL (ref 3.87–5.11)
RDW: 12.3 % (ref 11.5–15.5)
WBC: 5.8 10*3/uL (ref 4.0–10.5)
nRBC: 0 % (ref 0.0–0.2)

## 2023-12-29 LAB — COMPREHENSIVE METABOLIC PANEL WITH GFR
ALT: 117 U/L — ABNORMAL HIGH (ref 0–44)
AST: 54 U/L — ABNORMAL HIGH (ref 15–41)
Albumin: 4.1 g/dL (ref 3.5–5.0)
Alkaline Phosphatase: 165 U/L — ABNORMAL HIGH (ref 38–126)
Anion gap: 7 (ref 5–15)
BUN: 28 mg/dL — ABNORMAL HIGH (ref 8–23)
CO2: 27 mmol/L (ref 22–32)
Calcium: 9.5 mg/dL (ref 8.9–10.3)
Chloride: 100 mmol/L (ref 98–111)
Creatinine, Ser: 1.08 mg/dL — ABNORMAL HIGH (ref 0.44–1.00)
GFR, Estimated: 51 mL/min — ABNORMAL LOW (ref >60)
Glucose, Bld: 97 mg/dL (ref 70–99)
Potassium: 4.4 mmol/L (ref 3.5–5.1)
Sodium: 134 mmol/L — ABNORMAL LOW (ref 135–145)
Total Bilirubin: 0.8 mg/dL (ref 0.0–1.2)
Total Protein: 7.5 g/dL (ref 6.5–8.1)

## 2023-12-29 LAB — I-STAT CG4 LACTIC ACID, ED: Lactic Acid, Venous: 0.7 mmol/L (ref 0.5–1.9)

## 2023-12-29 MED ORDER — IOHEXOL 300 MG/ML  SOLN
100.0000 mL | Freq: Once | INTRAMUSCULAR | Status: AC | PRN
  Administered 2023-12-29: 100 mL via INTRAVENOUS

## 2023-12-29 MED ORDER — MORPHINE SULFATE (PF) 4 MG/ML IV SOLN
4.0000 mg | Freq: Once | INTRAVENOUS | Status: AC
  Administered 2023-12-29: 4 mg via INTRAVENOUS
  Filled 2023-12-29: qty 1

## 2023-12-29 MED ORDER — SODIUM CHLORIDE (PF) 0.9 % IJ SOLN
INTRAMUSCULAR | Status: AC
  Filled 2023-12-29: qty 50

## 2023-12-29 MED ORDER — KETOROLAC TROMETHAMINE 15 MG/ML IJ SOLN
15.0000 mg | Freq: Once | INTRAMUSCULAR | Status: AC
  Administered 2023-12-29: 15 mg via INTRAVENOUS
  Filled 2023-12-29: qty 1

## 2023-12-29 MED ORDER — FLEET ENEMA RE ENEM
1.0000 | ENEMA | Freq: Once | RECTAL | Status: AC
  Administered 2023-12-29: 1 via RECTAL
  Filled 2023-12-29: qty 1

## 2023-12-29 MED ORDER — SODIUM CHLORIDE 0.9 % IV BOLUS
500.0000 mL | Freq: Once | INTRAVENOUS | Status: AC
Start: 2023-12-29 — End: 2023-12-29
  Administered 2023-12-29: 500 mL via INTRAVENOUS

## 2023-12-29 NOTE — ED Triage Notes (Signed)
 Pt brought in from cancer center for pain management and scans to find cause of pain. She had a stent placed near R breast and there is now pain near her right breast.

## 2023-12-29 NOTE — Assessment & Plan Note (Signed)
 Please review oncology history for additional details and timeline of events.    On 12/18/2023, MRCP showed moderate to severe intrahepatic bile duct dilatation.  There was an abrupt beak like narrowing of the mid common bile duct at the level of the head of the pancreas.  Findings concerning for stricture at the level of head of pancreas.    CA 19-9 was increased at 180 on 12/19/2023.  On 12/21/2023, Dr. Brice Campi performed ERCP.  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  Stricture was malignant appearing.  The upper third of the main bile duct, left main hepatic duct and right main hepatic duct were moderately dilated, secondary to a stricture.  Extremely difficult cannulation requiring attempt at double wire, over pancreatic stent, precut fistulotomy to eventually gain biliary access. One temporary plastic biliary stent was placed into the ventral pancreatic duct.  One plastic biliary stent was placed in the CBD to traverse the stricture.  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  IHC stain for H. pylori was positive.   On 12/22/2023 CT chest with contrast showed no evidence of intrathoracic metastatic disease.  Plan for evaluation for surgical resection.  We will also discuss her case in multidisciplinary GI tumor conference.  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   Today I discussed diagnosis, prognosis, plan of care, treatment options.  Reviewed NCCN guidelines.  The condition is aggressive, and surgical removal is the preferred treatment option.  - Refer to Dr. Leighton Punches for surgical evaluation and potential removal of the bile duct tumor.  -We will consider chemotherapy and radiation if surgery is not  feasible upfront.  Patient presented to clinic today to establish care with us . She was in significant amount of abdominal/right chest wall pain and had to be directed to ED for further evaluation and admission and to rule out cholangitis/pancreatitis after recent intervention.

## 2023-12-29 NOTE — ED Notes (Signed)
Patient ambulated to bathroom with 1 assist.

## 2023-12-29 NOTE — Assessment & Plan Note (Signed)
 H. pylori infection being treated with Talicia, a strong triple therapy started yesterday. Talicia contains omeprazole, which should help control pain. Sucralfate prescribed for ulcer management, to be taken separately from [place]. - Continue Talicia as prescribed for H. pylori infection. - Use sucralfate as needed for ulcer management, ensuring it is taken at separate times from Talicia.

## 2023-12-29 NOTE — ED Provider Notes (Signed)
 [place] EMERGENCY DEPARTMENT AT [facility] Provider Note   CSN: [id number] Arrival date & time: 12/29/23  1144     History  Chief Complaint  Patient presents with   Breast Pain    R breast pain    CHAUNTAE HULTS is a 83 y.o. 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  She was seen in the office by her oncologist Dr.pasam and reported pain at the site of the stent.  She was sent here for further evaluation given concern for complication or infection following stent placement  HPI     Home Medications Prior to Admission medications   Medication Sig Start Date End Date Taking? Authorizing Provider  acetaminophen  (TYLENOL ) 500 MG tablet Take 500 mg by mouth in the morning and at bedtime.   Yes [provider]  Amoxicill-Rifabutin-Omeprazole (TALICIA) 250-12.5-10 MG CPDR Take 4 capsules by mouth in the morning, at noon, and at bedtime. 12/27/23  Yes Mansouraty, Albino Alu., MD  aspirin  EC 81 MG tablet Take 1 tablet (81 mg total) by mouth daily. Swallow whole. Patient taking differently: Take 81 mg by mouth in the morning. Swallow whole. 10/31/20  Yes Verma Gobble, NP  losartan  (COZAAR ) 100 MG tablet Take 1 tablet (100 mg total) by mouth daily. Essential hypertension I10 04/29/23  Yes Verma Gobble, NP  ondansetron  (ZOFRAN ) 4 MG tablet Take 1 tablet (4 mg total) by mouth every 8 (eight) hours as needed for nausea or vomiting. 12/13/23  Yes [name], Dinah C, NP  pantoprazole  (PROTONIX ) 40 MG tablet Take 1 tablet (40 mg total) by mouth daily. 12/23/23  Yes Verlyn Goad, MD  rosuvastatin  (CRESTOR ) 20 MG tablet Take 0.5 tablets (10 mg total) by mouth daily. 12/23/23  Yes Verlyn Goad, MD  sucralfate (CARAFATE) 1 g tablet Take 1 tablet (1 g total) by mouth 2 (two) times daily. Patient taking differently: Take 1 g by mouth daily as  needed (acid reflux). 12/23/23 01/22/24 Yes Verlyn Goad, MD  Multiple Vitamins-Minerals (PRESERVISION AREDS 2) CAPS Take 1 capsule by mouth daily. Patient not taking: Reported on 12/29/2023    [provider]      Allergies    Aricept  [donepezil ] and Vioxx [rofecoxib]    Review of Systems   Review of Systems  Physical Exam Updated Vital Signs BP 122/65 (BP Location: Left Arm)   Pulse 64   Temp 97.9 F (36.6 C) (Oral)   Resp 18   Ht 5' (1.524 m)   Wt 43.3 kg   SpO2 99%   BMI 18.63 kg/m  Physical Exam Vitals and nursing note reviewed.  HENT:     Head: Normocephalic and atraumatic.  Eyes:     Pupils: Pupils are equal, round, and reactive to light.  Cardiovascular:     Rate and Rhythm: Normal rate and regular rhythm.  Pulmonary:     Effort: Pulmonary effort is normal.     Breath sounds: Normal breath sounds.  Abdominal:     Palpations: Abdomen is soft.     Tenderness: There is no abdominal tenderness.  Musculoskeletal:     Comments: Right paraspinal thoracic tenderness with no midline tenderness step-off deformity  Skin:    General: Skin is warm and dry.  Neurological:     Mental Status: She is alert.  Psychiatric:        Mood and Affect:  Mood normal.     ED Results / Procedures / Treatments   Labs (all labs ordered are listed, but only abnormal results are displayed) Labs Reviewed  COMPREHENSIVE METABOLIC PANEL WITH GFR - Abnormal; Notable for the following components:      Result Value   Sodium 134 (*)    BUN 28 (*)    Creatinine, Ser 1.08 (*)    AST 54 (*)    ALT 117 (*)    Alkaline Phosphatase 165 (*)    GFR, Estimated 51 (*)    All other components within normal limits  CULTURE, BLOOD (ROUTINE X 2)  CULTURE, BLOOD (ROUTINE X 2)  CBC WITH DIFFERENTIAL/PLATELET  I-STAT CG4 LACTIC ACID, ED    EKG None  Radiology CT ABDOMEN PELVIS W CONTRAST Result Date: 12/29/2023 CLINICAL DATA:  Right flank pain. Common bile duct adenocarcinoma.  Internal biliary stent placement. * Tracking Code: BO * EXAM: CT ABDOMEN AND PELVIS WITH CONTRAST TECHNIQUE: Multidetector CT imaging of the abdomen and pelvis was performed using the standard protocol following bolus administration of intravenous contrast. RADIATION DOSE REDUCTION: This exam was performed according to the departmental dose-optimization program which includes automated exposure control, adjustment of the mA and/or kV according to patient size and/or use of iterative reconstruction technique. CONTRAST:  OMNIPAQUE  IOHEXOL  300 MG/ML  SOLN COMPARISON:  MRI on 12/18/2023 FINDINGS: Lower Chest: No acute findings. Hepatobiliary: Mild diffuse hepatic steatosis. No hepatic masses identified. Gallbladder is collapsed. Internal common bile duct stent is now seen in place with resolution of diffuse biliary ductal dilatation since previous study. Soft tissue thickening is seen involving the proximal common bile duct in the porta hepatis surrounding the biliary stent. This is consistent with known bile duct adenocarcinoma/cholangiocarcinoma. Pancreas: No pancreatic mass identified. No evidence of pancreatitis or pancreatic ductal dilatation. Spleen: Within normal limits in size and appearance. Adrenals/Urinary Tract: No suspicious masses identified. No evidence of ureteral calculi or hydronephrosis. Stomach/Bowel: No evidence of obstruction, inflammatory process or abnormal fluid collections. Diverticulosis is seen mainly involving the sigmoid colon, however there is no evidence of diverticulitis. Normal appendix visualized. Large colonic stool burden noted. Vascular/Lymphatic: No pathologically enlarged lymph nodes. No acute vascular findings. Circumaortic left renal vein incidentally. Reproductive: Prior hysterectomy noted. Adnexal regions are unremarkable in appearance. Other:  None. Musculoskeletal:  No suspicious bone lesions identified. IMPRESSION: Internal common bile duct stent in place, with  resolution of diffuse biliary ductal dilatation since previous study. Soft tissue thickening involving the proximal common bile duct surrounding stent, consistent with known bile duct adenocarcinoma/cholangiocarcinoma. No evidence of metastatic disease. Mild hepatic steatosis. Colonic diverticulosis, without radiographic evidence of diverticulitis. Large colonic stool burden noted; recommend clinical correlation for possible constipation. Electronically Signed   By: Marlyce Sine M.D.   On: 12/29/2023 17:47    Procedures Procedures    Medications Ordered in ED Medications  iohexol  (OMNIPAQUE ) 300 MG/ML solution 100 mL (100 mLs Intravenous Contrast Given 12/29/23 1437)  morphine (PF) 4 MG/ML injection 4 mg (4 mg Intravenous Given 12/29/23 1621)    ED Course/ Medical Decision Making/ A&P Clinical Course as of 12/29/23 1753  Thu Dec 29, 2023  1752 Laboratory workup unremarkable overall.  LFTs downtrending.  Awaiting CT abdomen pelvis.  Pain well-controlled at this time  I, Rafael Bun DO, am transitioning care of this patient to the oncoming provider pending CT abdomen pelvis reevaluation disposition [MP]    Clinical Course User Index [MP] Sallyanne Creamer, DO  Medical Decision Making 83 year old female with history as above including recent biliary stent placement for adenocarcinoma of the bile duct presenting for pain of the right flank right back right breast.  Seen earlier today by oncology office sent to for further evaluation given concern for potential stent complication.  Will obtain laboratory workup including blood cultures and lactic given concern for potential infection of biliary stent along with CT abdomen pelvis to better visualize any evidence of acute infection or complication related to stent placement.  At the time of my assessment she did have some right thoracic tenderness.  No overlying skin changes in the right breast right flank or right  back.  Pain well-controlled at this time.  Amount and/or Complexity of Data Reviewed Labs: ordered. Radiology: ordered.  Risk Prescription drug management.           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

## 2023-12-29 NOTE — ED Provider Notes (Signed)
 Patient seen after prior EDP.  CT suggests constipation more so than any other acute finding .   After enema, patient feels improved and desires DC home.   Importance of close FU stressed. Strict return precautions given and understood.    Burnette Carte, MD 12/29/23 2006

## 2023-12-29 NOTE — Progress Notes (Signed)
 PATIENT NAVIGATOR PROGRESS NOTE  Name: Caroline Snyder Date: 12/29/2023 MRN: [id number]  DOB: 12-16-1940   Reason for visit:  New Patient Appointment  Comments:   Met with patient and her family during her new patient appointment with Dr. Randye Buttner.  Patient and family given my direct contact information and were instructed to contact office if they had any questions or concerns after today.  Patient presented with increased pain during visit today and plan was for patient to be taken to ED for further evaluation and pain management.  Informed family and patient that I will follow-up with patient during her next outpatient visit with Dr. Randye Buttner. Patient and family both verbalized understanding.    Time spent counseling/coordinating care: > 60 minutes

## 2023-12-29 NOTE — ED Notes (Signed)
 Family upset due to longer wait times on radiologist report being read. Dr. Reba Camper notified to please come speak with patient and give them updates.

## 2023-12-29 NOTE — Discharge Instructions (Signed)
 Return for any problem.  ?

## 2023-12-29 NOTE — ED Notes (Signed)
 Patient back from CT.

## 2023-12-29 NOTE — Progress Notes (Signed)
 Called report to the ED charge nurse, Elisabeth Guild, and transferred pt per w/c due to uncontrolled right breast pain of 9-10/10 per pt report. Patient was visibly in pain grimacing and had shallow breathing. Patient was undergoing first Oncology appt with Dr. Randye Buttner for Adenocarcinoma of the Bile Duct, when it was recognized that patient needed pain control and Dr. Randye Buttner wanted also some imaging to determine source of pain. Patient recently had a stent placed per Dr. Denette Finner report. ED charge nurse made aware of all of these details. Patient delivered to ED with staff at stretcher side upon my departure. Additional report handed off to ED nurse at stretcher side.

## 2023-12-29 NOTE — ED Notes (Signed)
 Pt had multiple quarter size balls of stool amounting to a large orange.

## 2023-12-29 NOTE — Progress Notes (Signed)
 [facility]  ONCOLOGY CONSULT NOTE   PATIENT NAME: Caroline Snyder   MR#: [id number] DOB: June 07, 1941  DATE OF SERVICE: 12/29/2023   REFERRING PROVIDER  Hospital follow up.   Patient Care Team: Verma Gobble, NP as PCP - General (Geriatric Medicine) Amedeo Jupiter, MD as Consulting Physician (Ophthalmology) Corie Diamond, MD as Consulting Physician (Ophthalmology) Davia Erps, MD (Inactive) as Consulting Physician (Gynecology) Gerald Kitty, Isiah Mare, RN as Oncology Nurse Navigator    CHIEF COMPLAINT/ PURPOSE OF CONSULTATION:   Newly diagnosed adenocarcinoma of the biliary duct.  ASSESSMENT & PLAN:   GEM CONKLE is a 83 y.o. lady with a past medical history of vascular dementia, HTN, HLD, depression, migraines and GERD, was referred to our clinic for newly diagnosed adenocarcinoma of the biliary duct.  Adenocarcinoma of bile duct [facility]) Please review oncology history for additional details and timeline of events.    On 12/18/2023, MRCP showed moderate to severe intrahepatic bile duct dilatation.  There was an abrupt beak like narrowing of the mid common bile duct at the level of the head of the pancreas.  Findings concerning for stricture at the level of head of pancreas.    CA 19-9 was increased at 180 on 12/19/2023.  On 12/21/2023, Dr. Brice Campi performed ERCP.  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  Stricture was malignant appearing.  The upper third of the main bile duct, left main hepatic duct and right main hepatic duct were moderately dilated, secondary to a stricture.  Extremely difficult cannulation requiring attempt at double wire, over pancreatic stent, precut fistulotomy to eventually gain biliary access. One temporary plastic biliary stent was placed into the ventral pancreatic duct.  One plastic biliary stent was placed in the CBD to traverse the stricture.  FNA from the CBD showed  adenocarcinoma.  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  IHC stain for H. pylori was positive.   On 12/22/2023 CT chest with contrast showed no evidence of intrathoracic metastatic disease.  Plan for evaluation for surgical resection.  We will also discuss her case in multidisciplinary GI tumor conference.  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   Today I discussed diagnosis, prognosis, plan of care, treatment options.  Reviewed NCCN guidelines.  The condition is aggressive, and surgical removal is the preferred treatment option.  - Refer to Dr. Leighton Punches for surgical evaluation and potential removal of the bile duct tumor.  -We will consider chemotherapy and radiation if surgery is not feasible upfront.  Patient presented to clinic today to establish care with us . She was in significant amount of abdominal/right chest wall pain and had to be directed to ED for further evaluation and admission and to rule out cholangitis/pancreatitis after recent intervention.  Abdominal pain Pain localized under the right breast, potentially referred pain from the plastic stent placed in the common bile duct. The stent may cause irritation and referred pain to the diaphragm area and shoulder. The pain has increased significantly, raising concerns about possible infection or inflammation. - Transfer to the emergency room for pain management and further evaluation. - She may end up needing imaging with CT or MRI to assess for infection or inflammation.  H. pylori infection H. pylori infection being treated with Talicia, a  strong triple therapy started yesterday. Talicia contains omeprazole, which should help control pain. Sucralfate prescribed for ulcer management, to be taken separately from [place]. -  Continue Talicia as prescribed for H. pylori infection. - Use sucralfate as needed for ulcer management, ensuring it is taken at separate times from Talicia.    I reviewed lab results and outside records for this visit and discussed relevant results with the patient. Diagnosis, plan of care and treatment options were also discussed in detail with the patient. Opportunity provided to ask questions and answers provided to her apparent satisfaction. Provided instructions to call our clinic with any problems, questions or concerns prior to return visit. I recommended to continue follow-up with PCP and sub-specialists. She verbalized understanding and agreed with the plan. No barriers to learning was detected.  NCCN guidelines have been consulted in the planning of this patient's care.  Arlo Berber, MD  12/29/2023 1:26 PM  [facility] - A DEPT OF [facility]. [facility] [address] [place] [address] Dept: [phone] Dept Fax: [phone]   HISTORY OF PRESENTING ILLNESS:   I have reviewed her chart and materials related to her cancer extensively and collaborated history with the patient. Summary of oncologic history is as follows:  ONCOLOGY HISTORY:  83 y.o. lady with a past medical history of vascular dementia, HTN, HLD, depression, migraines and GERD, was admitted to the hospital on 12/18/2023 after she presented with intractable nausea and vomiting.  She was noted to be hypertensive and tachypneic.  Labs showed  AST/ALT 300s, and Alk Phos 315. RUQ US  with CBD dilation.  She was admitted for further workup and evaluation.  On 12/18/2023, MRCP showed moderate to severe intrahepatic bile duct dilatation.  There was an abrupt beak like narrowing of the mid common bile duct at the level of the head of the pancreas.  Findings concerning for stricture at the level of head of pancreas.  Mild diffuse edema within the pancreatic parenchyma  noted, which was concerning for pancreatitis. Mild increase caliber of the main pancreatic duct which measured up to 4 mm at the level of the pancreatic neck, which abruptly decreases in caliber at the head of pancreas. Focal area of mild increased T2 signal with restricted diffusion in the anterior head of pancreas measures 2.2 x 1.2 by 2.2 cm. Cannot exclude pancreatic head neoplasm. Consider further evaluation with endoscopic ultrasound and tissue sampling.  CA 19-9 was increased at 180 on 12/19/2023.  On 12/21/2023, Dr. Brice Campi performed ERCP.  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  Stricture was malignant appearing.  The upper third of the main bile duct, left main hepatic duct and right main hepatic duct were moderately dilated, secondary to a stricture.  Extremely difficult cannulation requiring attempt at double wire, over pancreatic stent, precut fistulotomy to eventually gain biliary access. One temporary plastic biliary stent was placed into the ventral pancreatic duct.  One plastic biliary stent was placed in the CBD to traverse the stricture.  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  IHC stain for H. pylori was positive.    On 12/22/2023 CT chest with contrast showed no evidence of intrathoracic metastatic disease.  Patient presented to clinic on 12/29/2023 to establish care with us .  She was in significant amount of abdominal/right chest wall pain and had to be directed to ED for further evaluation and admission.  Plan for evaluation for surgical resection.  We will also discuss her case in multidisciplinary GI tumor conference.   INTERVAL HISTORY:  Discussed the use of AI scribe software for clinical note transcription with the patient, who gave verbal  consent to proceed.  History of Present Illness CAASI GIGLIA "[name]" is an 83 year old female with bile duct cancer who presents with severe abdominal pain.  She has been experiencing severe right upper quadrant abdominal pain for the past two weeks, radiating from underneath her right breast to the back. The pain began after a hospital procedure and has progressively worsened, becoming stabbing in nature. She describes the pain as 'stabbing' and notes it has not been this severe before. She has a history of back pain, which was previously mild, but the current pain is more intense and localized to the upper right quadrant.  She was initially hospitalized for nausea and vomiting, during which an ultrasound and MRI revealed a stricture in the bile duct. A subsequent endoscopic biopsy and brushings confirmed adenocarcinoma. A plastic stent was placed, but she continues to experience significant discomfort. She was previously seen in the emergency room for constipation, where an x-ray showed that one of the pancreatic stents had moved into the small intestine.  She has been taking Zofran  for nausea and recently started on Talicia for H. pylori treatment. She took one Tylenol  for pain relief this morning but has no other pain medications at home. She has been eating small portions three times a day but has been cautious not to overeat to avoid nausea. No fever, chills, or significant changes in her condition until the recent increase in pain severity.    MEDICAL HISTORY:  Past Medical History:  Diagnosis Date   Abnormal CT of the chest 11/24/2013   See CT chest 11/22/13  1. No CT evidence of pulmonary arterial embolic disease.   2. Enlarged lymph nodes in the right hilar and subcarinal region.   These may represent reactive lymph nodes, clinical correlation   recommended. There is otherwise no evidence of mediastinal or   parenchymal masses or nodules nor infiltrates.   3. Atelectasis versus  scarring within the lung bases as well as   interst   Acute encephalopathy 09/13/2023   Allergy    Altered mental status 09/13/2023   Benign neoplasm of colon 02/11/2012   Bunion 07/01/2010   Closed nondisplaced fracture of distal phalanx of right thumb 2020/07/24   Death of child 09-02-16   Adult son truck driver died 16/10/96 in accident.     Depression 05/05/2007   External hemorrhoids without mention of complication 03/24/1999   GERD (gastroesophageal reflux disease) 08/02/1998   Hyperlipidemia 06/29/2006   Hypertensive emergency 09/13/2023   Memory loss 04/15/2003   Migraine without aura, without mention of intractable migraine without mention of status migrainosus 03/24/1999   Osteoporosis, unspecified 04/15/2004   Tear film insufficiency, unspecified 03/24/1999   Unspecified essential hypertension 11/22/2007    SURGICAL HISTORY: Past Surgical History:  Procedure Laterality Date   COLONOSCOPY  06-16-2007   Internal hemorrhoids,laxity of anal sphincter,polyp at 25cm form anal verge, tortuous sigmoid colon. Dr.Orr    ERCP N/A 12/21/2023   Procedure: ERCP, WITH INTERVENTION IF INDICATED;  Surgeon: Brice Campi Albino Alu., MD;  Location: [facility] ENDOSCOPY;  Service: Gastroenterology;  Laterality: N/A;   ESOPHAGOGASTRODUODENOSCOPY N/A 12/21/2023   Procedure:  EGD (ESOPHAGOGASTRODUODENOSCOPY);  Surgeon: Normie Becton., MD;  Location: [facility] ENDOSCOPY;  Service: Gastroenterology;  Laterality: N/A;   EUS N/A 12/21/2023   Procedure: ULTRASOUND, UPPER GI TRACT, ENDOSCOPIC;  Surgeon: Brice Campi Albino Alu., MD;  Location: [facility] ENDOSCOPY;  Service: Gastroenterology;  Laterality: N/A;   EYE SURGERY Bilateral 2010   cataract Dr. Danley Dusky   FINE NEEDLE ASPIRATION  12/21/2023   Procedure: FINE NEEDLE ASPIRATION;  Surgeon: Brice Campi Albino Alu., MD;  Location: [facility] ENDOSCOPY;  Service: Gastroenterology;;   VAGINAL HYSTERECTOMY  1980    SOCIAL HISTORY: Social History   Socioeconomic History    Marital status: Widowed    Spouse name: Not on file   Number of children: 2   Years of education: Not on file   Highest education level: Not on file  Occupational History   Occupation: Retired  Tobacco Use   Smoking status: Never   Smokeless tobacco: Never  Vaping Use   Vaping status: Never Used  Substance and Sexual Activity   Alcohol use: No    Alcohol/week: 0.0 standard drinks of alcohol   Drug use: No   Sexual activity: Not Currently    Comment: 1st intercourse- 16, partners- 2,   Other Topics Concern   Not on file  Social History Narrative   Not on file   Social Drivers of Health   Financial Resource Strain: Low Risk  (06/09/2018)   Overall Financial Resource Strain (CARDIA)    Difficulty of Paying Living Expenses: Not hard at all  Food Insecurity: No Food Insecurity (12/18/2023)   Hunger Vital Sign    Worried About Running Out of Food in the Last Year: Never true    Ran Out of Food in the Last Year: Never true  Transportation Needs: No Transportation Needs (12/18/2023)   PRAPARE - Administrator, Civil Service (Medical): No    Lack of Transportation (Non-Medical): No  Physical Activity: Insufficiently Active (06/09/2018)   Exercise Vital Sign    Days of Exercise per Week: 1 day    Minutes of Exercise per Session: 60 min  Stress: No Stress Concern Present (06/09/2018)   [employer] of Occupational Health - Occupational Stress Questionnaire    Feeling of Stress : Not at all  Social Connections: Moderately Isolated (12/18/2023)   Social Connection and Isolation Panel [NHANES]    Frequency of Communication with Friends and Family: More than three times a week    Frequency of Social Gatherings with Friends and Family: More than three times a week    Attends Religious Services: More than 4 times per year    Active Member of [employer] or Organizations: No    Attends Banker Meetings: Never    Marital Status: Widowed  Intimate Partner Violence: Not  At Risk (12/18/2023)   Humiliation, Afraid, Rape, and Kick questionnaire    Fear of Current or Ex-Partner: No    Emotionally Abused: No    Physically Abused: No    Sexually Abused: No    FAMILY HISTORY: Family History  Problem Relation Age of Onset   Alzheimer's disease Mother    Diabetes Mother    Transient ischemic attack Mother    Heart disease Mother        MI, CHF   Alcohol abuse Father    Kidney disease Father    Hypertension Sister    Hypertension Brother    Heart disease Brother    Hypertension Brother    Hypertension Sister    Hypertension Brother  Other Son        truck accident 07/2016   Colon cancer Neg Hx    Stomach cancer Neg Hx     ALLERGIES:  She is allergic to aricept  [donepezil ] and vioxx [rofecoxib].  MEDICATIONS:  Current Facility-Administered Medications  Medication Dose Route Frequency Provider Last Rate Last Admin   denosumab  (PROLIA ) injection 60 mg  60 mg Subcutaneous Once Eubanks, Jessica K, NP       Current Outpatient Medications  Medication Sig Dispense Refill   acetaminophen  (TYLENOL ) 500 MG tablet Take 500 mg by mouth every 6 (six) hours as needed for mild pain (pain score 1-3) or headache.     Amoxicill-Rifabutin-Omeprazole (TALICIA) 250-12.5-10 MG CPDR Take 4 capsules by mouth in the morning, at noon, and at bedtime. 168 capsule 0   aspirin  EC 81 MG tablet Take 1 tablet (81 mg total) by mouth daily. Swallow whole. (Patient taking differently: Take 81 mg by mouth in the morning. Swallow whole.) 30 tablet 11   losartan  (COZAAR ) 100 MG tablet Take 1 tablet (100 mg total) by mouth daily. Essential hypertension I10 90 tablet 3   ondansetron  (ZOFRAN ) 4 MG tablet Take 1 tablet (4 mg total) by mouth every 8 (eight) hours as needed for nausea or vomiting. 20 tablet 0   rosuvastatin  (CRESTOR ) 20 MG tablet Take 0.5 tablets (10 mg total) by mouth daily.     Multiple Vitamins-Minerals (PRESERVISION AREDS 2) CAPS Take 1 capsule by mouth daily.      pantoprazole  (PROTONIX ) 40 MG tablet Take 1 tablet (40 mg total) by mouth daily. (Patient not taking: Reported on 12/29/2023) 30 tablet 2   sucralfate (CARAFATE) 1 g tablet Take 1 tablet (1 g total) by mouth 2 (two) times daily. (Patient not taking: Reported on 12/29/2023) 60 tablet 0    REVIEW OF SYSTEMS:    Review of Systems - Oncology  All other pertinent systems were reviewed with the patient and are negative.  PHYSICAL EXAMINATION:    Onc Performance Status - 12/29/23 1100       ECOG Perf Status   ECOG Perf Status Ambulatory and capable of all selfcare but unable to carry out any work activities.  Up and about more than 50% of waking hours      KPS SCALE   KPS % SCORE Requires occasional assistance but is able to care for most needs             Vitals:   12/29/23 1057  BP: (!) 142/76  Pulse: 68  Resp: 13  Temp: 97.6 F (36.4 C)  SpO2: 98%   Filed Weights   12/29/23 1057  Weight: 95 lb 6.4 oz (43.3 kg)    Physical Exam Constitutional:      Comments: She was in distress from abdominal/right-sided breast pain  HENT:     Head: Normocephalic and atraumatic.  Eyes:     Conjunctiva/sclera: Conjunctivae normal.  Cardiovascular:     Rate and Rhythm: Normal rate and regular rhythm.     Heart sounds: Normal heart sounds.  Pulmonary:     Effort: Pulmonary effort is normal. No respiratory distress.  Abdominal:     General: There is no distension.     Palpations: Abdomen is soft. There is no mass.  Musculoskeletal:     Right lower leg: No edema.     Left lower leg: No edema.  Lymphadenopathy:     Cervical: No cervical adenopathy.  Neurological:     General: No focal deficit  present.     Mental Status: She is alert.  Psychiatric:        Mood and Affect: Mood normal.        Behavior: Behavior normal.     LABORATORY DATA:   I have reviewed the data as listed.  Results for orders placed or performed during the hospital encounter of 12/29/23  Comprehensive  metabolic panel  Result Value Ref Range   Sodium 134 (L) 135 - 145 mmol/L   Potassium 4.4 3.5 - 5.1 mmol/L   Chloride 100 98 - 111 mmol/L   CO2 27 22 - 32 mmol/L   Glucose, Bld 97 70 - 99 mg/dL   BUN 28 (H) 8 - 23 mg/dL   Creatinine, Ser 4.74 (H) 0.44 - 1.00 mg/dL   Calcium  9.5 8.9 - 10.3 mg/dL   Total Protein 7.5 6.5 - 8.1 g/dL   Albumin 4.1 3.5 - 5.0 g/dL   AST 54 (H) 15 - 41 U/L   ALT 117 (H) 0 - 44 U/L   Alkaline Phosphatase 165 (H) 38 - 126 U/L   Total Bilirubin 0.8 0.0 - 1.2 mg/dL   GFR, Estimated 51 (L) >60 mL/min   Anion gap 7 5 - 15  CBC with Differential  Result Value Ref Range   WBC 5.8 4.0 - 10.5 K/uL   RBC 4.19 3.87 - 5.11 MIL/uL   Hemoglobin 12.9 12.0 - 15.0 g/dL   HCT 25.9 56.3 - 87.5 %   MCV 92.8 80.0 - 100.0 fL   MCH 30.8 26.0 - 34.0 pg   MCHC 33.2 30.0 - 36.0 g/dL   RDW 64.3 32.9 - 51.8 %   Platelets 309 150 - 400 K/uL   nRBC 0.0 0.0 - 0.2 %   Neutrophils Relative % 69 %   Neutro Abs 4.1 1.7 - 7.7 K/uL   Lymphocytes Relative 19 %   Lymphs Abs 1.1 0.7 - 4.0 K/uL   Monocytes Relative 8 %   Monocytes Absolute 0.5 0.1 - 1.0 K/uL   Eosinophils Relative 2 %   Eosinophils Absolute 0.1 0.0 - 0.5 K/uL   Basophils Relative 1 %   Basophils Absolute 0.1 0.0 - 0.1 K/uL   Immature Granulocytes 1 %   Abs Immature Granulocytes 0.03 0.00 - 0.07 K/uL  I-Stat Lactic Acid  Result Value Ref Range   Lactic Acid, Venous 0.7 0.5 - 1.9 mmol/L    Lab Results  Component Value Date   WBC 5.8 12/29/2023   HGB 12.9 12/29/2023   HCT 38.9 12/29/2023   MCV 92.8 12/29/2023   PLT 309 12/29/2023   Recent Labs    12/23/23 0741 12/26/23 1925 12/29/23 1226  NA 139 134* 134*  K 3.5 4.4 4.4  CL 105 95* 100  CO2 24 26 27   GLUCOSE 118* 111* 97  BUN 9 23 28*  CREATININE 1.00 0.98 1.08*  CALCIUM  9.1 9.9 9.5  GFRNONAA 56* 57* 51*  PROT 6.0* 7.3 7.5  ALBUMIN 3.3* 4.4 4.1  AST 160* 129* 54*  ALT 284* 278* 117*  ALKPHOS 251* 245* 165*  BILITOT 0.8 0.4 0.8    Results for  orders placed or performed during the hospital encounter of 12/26/23 (from the past 72 hours)  Lipase, blood     Status: None   Collection Time: 12/26/23  7:25 PM  Result Value Ref Range   Lipase 43 11 - 51 U/L    Comment: Performed at [facility], [address] [place],  [place] [address]  Comprehensive metabolic panel     Status: Abnormal   Collection Time: 12/26/23  7:25 PM  Result Value Ref Range   Sodium 134 (L) 135 - 145 mmol/L   Potassium 4.4 3.5 - 5.1 mmol/L   Chloride 95 (L) 98 - 111 mmol/L   CO2 26 22 - 32 mmol/L   Glucose, Bld 111 (H) 70 - 99 mg/dL    Comment: Glucose reference range applies only to samples taken after fasting for at least 8 hours.   BUN 23 8 - 23 mg/dL   Creatinine, Ser 2.13 0.44 - 1.00 mg/dL   Calcium  9.9 8.9 - 10.3 mg/dL   Total Protein 7.3 6.5 - 8.1 g/dL   Albumin 4.4 3.5 - 5.0 g/dL   AST 086 (H) 15 - 41 U/L   ALT 278 (H) 0 - 44 U/L   Alkaline Phosphatase 245 (H) 38 - 126 U/L   Total Bilirubin 0.4 0.0 - 1.2 mg/dL   GFR, Estimated 57 (L) >60 mL/min    Comment: (NOTE) Calculated using the CKD-EPI Creatinine Equation (2021)    Anion gap 13 5 - 15    Comment: Performed at [facility], [address] [place], [place] [address]  CBC     Status: Abnormal   Collection Time: 12/26/23  7:25 PM  Result Value Ref Range   WBC 11.1 (H) 4.0 - 10.5 K/uL   RBC 4.27 3.87 - 5.11 MIL/uL   Hemoglobin 13.0 12.0 - 15.0 g/dL   HCT 96.2 95.2 - 84.1 %   MCV 90.6 80.0 - 100.0 fL   MCH 30.4 26.0 - 34.0 pg   MCHC 33.6 30.0 - 36.0 g/dL   RDW 32.4 40.1 - 02.7 %   Platelets 346 150 - 400 K/uL   nRBC 0.0 0.0 - 0.2 %    Comment: Performed at [facility], [address] [place], [place] [address]       RADIOGRAPHIC STUDIES:  I have personally reviewed the radiological images as listed and agree with the findings in the report.  DG Abdomen 1 View Result Date: 12/26/2023 CLINICAL DATA:  Constipation EXAM: ABDOMEN - 1 VIEW  COMPARISON:  12/21/2023 FINDINGS: Supine frontal view of the abdomen and pelvis demonstrates an unremarkable bowel gas pattern with no obstruction or ileus. There is moderate retained stool within the distal colon. Common bile duct stent unchanged since recent MRCP. The orientation of the pancreatic duct stent placed previously suggests passage into the duodenal lumen. No masses or abnormal calcifications. No acute bony abnormalities. IMPRESSION: 1. Moderate fecal retention consistent with constipation. No bowel obstruction or ileus. 2. Stable common bile duct stent. However, the pancreatic duct stent has likely passed into the duodenal lumen. Electronically Signed   By: Bobbye Burrow M.D.   On: 12/26/2023 19:08   CT CHEST W CONTRAST Result Date: 12/23/2023 CLINICAL DATA:  Pancreatic duct stricture raising concern for pancreatic neoplasm. Evaluate for metastatic disease. * Tracking Code: BO * EXAM: CT CHEST WITH CONTRAST TECHNIQUE: Multidetector CT imaging of the chest was performed during intravenous contrast administration. RADIATION DOSE REDUCTION: This exam was performed according to the departmental dose-optimization program which includes automated exposure control, adjustment of the mA and/or kV according to patient size and/or use of iterative reconstruction technique. CONTRAST:  50mL OMNIPAQUE  IOHEXOL  350 MG/ML SOLN COMPARISON:  02/07/2014 FINDINGS: Cardiovascular: The heart size is normal. No substantial pericardial effusion. Coronary artery calcification is evident. Mild atherosclerotic calcification is noted  in the wall of the thoracic aorta. Mediastinum/Nodes: No mediastinal lymphadenopathy. There is no hilar lymphadenopathy. The esophagus has normal imaging features. There is no axillary lymphadenopathy. Lungs/Pleura: Biapical pleuroparenchymal scarring evident. Several very tiny scattered bilateral pulmonary nodules are identified, including 2 mm left upper lobe nodule seen on 46/4 and 2 mm  paraspinal right lower lobe on 01/20 5/4. No suspicious pulmonary nodule or mass. Subsegmental atelectasis or linear scarring noted in the lingula, right middle lobe and left lower lobe. No focal airspace consolidation. No pleural effusion. Upper Abdomen: Pneumobilia identified in the incompletely visualized liver, presumably secondary to biliary stent placement on 12/21/2023. Musculoskeletal: No worrisome lytic or sclerotic osseous abnormality. IMPRESSION: 1. No convincing evidence for metastatic disease in the chest. 2. Several very tiny scattered bilateral pulmonary nodules, likely benign. However, given patient history, surveillance warranted. 3. Pneumobilia in the incompletely visualized liver, presumably secondary to biliary stent placement on 12/21/2023. 4.  Aortic Atherosclerosis (ICD10-I70.0). Electronically Signed   By: Donnal Fusi M.D.   On: 12/23/2023 05:27   DG ERCP Result Date: 12/22/2023 CLINICAL DATA:  [id number] Elective surgery [id number] EXAM: ERCP COMPARISON:  CT AP, 12/11/2023.  MRCP, 12/18/2023. FLUOROSCOPY: Radiation Exposure Index and estimated peak skin dose (PSD); Reference air kerma (RAK), 43.1 mGy. FINDINGS: Limited oblique planar images of the RIGHT upper quadrant obtained C-arm. Images demonstrating flexible endoscopy, biliary duct cannulation, sphincterotomy, retrograde cholangiogram, pancreatic duct and common bile duct stent placement. Distal common bile duct shouldered tapering. No discrete evidence of biliary filling defect is demonstrated. IMPRESSION: Fluoroscopic imaging for ERCP, pancreatic duct and common bile duct stent placement. For complete description of intra procedural findings, please see performing service dictation. Electronically Signed   By: Art Largo M.D.   On: 12/22/2023 18:16   DG C-Arm 1-60 Min-No Report Result Date: 12/21/2023 Fluoroscopy was utilized by the requesting physician.  No radiographic interpretation.   MR ABDOMEN MRCP W WO CONTAST Result Date:  12/19/2023 CLINICAL DATA:  Evaluate etiology of biliary obstruction. Dilated common bile duct measuring 9 mm. EXAM: MRI ABDOMEN WITHOUT AND WITH CONTRAST (INCLUDING MRCP) TECHNIQUE: Multiplanar multisequence MR imaging of the abdomen was performed both before and after the administration of intravenous contrast. Heavily T2-weighted images of the biliary and pancreatic ducts were obtained, and three-dimensional MRCP images were rendered by post processing. CONTRAST:  5mL GADAVIST  GADOBUTROL  1 MMOL/ML IV SOLN COMPARISON:  Right upper quadrant sonogram 12/18/2023 FINDINGS: Lower chest: No acute findings. Hepatobiliary: No focal enhancing liver lesions identified. The gallbladder is decompressed. No pericholecystic inflammatory change. The common bile duct is dilated measuring 1.2 cm. Moderate to severe intrahepatic bile duct dilatation. No signs of choledocholithiasis. There is abrupt beak like narrowing of the mid common bile duct at the level of the head of pancreas, image 8/4. Pancreas: Mild diffuse edema within the pancreatic parenchyma noted. Mild increase caliber of the main pancreatic duct which measures up to 4 mm at the level of the pancreatic neck, which abruptly decreases in caliber at the head of pancreas. Focal area mild increased T2 signal with restricted diffusion in the anterior head of pancreas measuring 2.2 x 1.2 by 2.2 cm, image 75/6 and image 19/3. There are areas of restricted diffusion identified within the head of pancreas Spleen:  Within normal limits in size and appearance. Adrenals/Urinary Tract: No masses identified. No evidence of hydronephrosis. Stomach/Bowel: Visualized portions within the abdomen are unremarkable. Vascular/Lymphatic: Aortic atherosclerosis. No adenopathy identified. Other:  No ascites or focal fluid collections. Musculoskeletal: No suspicious bone lesions  identified. IMPRESSION: 1. The gallbladder is now decompressed. No signs of choledocholithiasis. 2. Moderate to severe  intrahepatic bile duct dilatation. There is abrupt beak like narrowing of the mid common bile duct at the level of the head of pancreas. Findings are concerning for a stricture at the level of the head of pancreas. 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 3. Mild diffuse edema within the pancreatic parenchyma noted, which is concerning for pancreatitis. No signs of pancreatic necrosis or pseudocyst formation. 4. Mild increase caliber of the main pancreatic duct which measures up to 4 mm at the level of the pancreatic neck, which abruptly decreases in caliber at the head of pancreas. Focal area of mild increased T2 signal with restricted diffusion in the anterior head of pancreas measures 2.2 x 1.2 by 2.2 cm. Cannot exclude pancreatic head neoplasm. Consider further evaluation with endoscopic ultrasound and tissue sampling. 5. Aortic Atherosclerosis (ICD10-I70.0). Electronically Signed   By: Kimberley Penman M.D.   On: 12/19/2023 05:18   MR 3D Recon At Scanner Result Date: 12/19/2023 CLINICAL DATA:  Evaluate etiology of biliary obstruction. Dilated common bile duct measuring 9 mm. EXAM: MRI ABDOMEN WITHOUT AND WITH CONTRAST (INCLUDING MRCP) TECHNIQUE: Multiplanar multisequence MR imaging of the abdomen was performed both before and after the administration of intravenous contrast. Heavily T2-weighted images of the biliary and pancreatic ducts were obtained, and three-dimensional MRCP images were rendered by post processing. CONTRAST:  5mL GADAVIST  GADOBUTROL  1 MMOL/ML IV SOLN COMPARISON:  Right upper quadrant sonogram 12/18/2023 FINDINGS: Lower chest: No acute findings. Hepatobiliary: No focal enhancing liver lesions identified. The gallbladder is decompressed. No pericholecystic inflammatory change. The common bile duct is dilated measuring 1.2 cm. Moderate to severe intrahepatic bile duct  dilatation. No signs of choledocholithiasis. There is abrupt beak like narrowing of the mid common bile duct at the level of the head of pancreas, image 8/4. Pancreas: Mild diffuse edema within the pancreatic parenchyma noted. Mild increase caliber of the main pancreatic duct which measures up to 4 mm at the level of the pancreatic neck, which abruptly decreases in caliber at the head of pancreas. Focal area mild increased T2 signal with restricted diffusion in the anterior head of pancreas measuring 2.2 x 1.2 by 2.2 cm, image 75/6 and image 19/3. There are areas of restricted diffusion identified within the head of pancreas Spleen:  Within normal limits in size and appearance. Adrenals/Urinary Tract: No masses identified. No evidence of hydronephrosis. Stomach/Bowel: Visualized portions within the abdomen are unremarkable. Vascular/Lymphatic: Aortic atherosclerosis. No adenopathy identified. Other:  No ascites or focal fluid collections. Musculoskeletal: No suspicious bone lesions identified. IMPRESSION: 1. The gallbladder is now decompressed. No signs of choledocholithiasis. 2. Moderate to severe intrahepatic bile duct dilatation. There is abrupt beak like narrowing of the mid common bile duct at the level of the head of pancreas. Findings are concerning for a stricture at the level of the head of pancreas. 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 3. Mild diffuse edema within the pancreatic parenchyma noted, which is concerning for pancreatitis. No signs of pancreatic necrosis or pseudocyst formation. 4. Mild increase caliber of the main pancreatic duct which measures up to 4 mm at the level of the pancreatic neck, which abruptly decreases in caliber at the head of pancreas. Focal area of mild increased  T2 signal with restricted diffusion in the anterior head of pancreas measures 2.2 x 1.2 by 2.2  cm. Cannot exclude pancreatic head neoplasm. Consider further evaluation with endoscopic ultrasound and tissue sampling. 5. Aortic Atherosclerosis (ICD10-I70.0). Electronically Signed   By: Kimberley Penman M.D.   On: 12/19/2023 05:18   DG Shoulder Left Result Date: 12/18/2023 CLINICAL DATA:  Pain. EXAM: LEFT SHOULDER - 2+ VIEW COMPARISON:  None Available. FINDINGS: There is no evidence of fracture or dislocation. Minor acromioclavicular spurring. Glenohumeral joint space is preserved with trace inferior glenoid spurring. No erosions or suspicious bone lesions. Soft tissues are unremarkable. IMPRESSION: Minor acromioclavicular and glenohumeral osteoarthritis. Electronically Signed   By: Chadwick Colonel M.D.   On: 12/18/2023 15:21   US  Abdomen Limited RUQ (LIVER/GB) Result Date: 12/18/2023 CLINICAL DATA:  Right upper quadrant pain skip EXAM: ULTRASOUND ABDOMEN LIMITED RIGHT UPPER QUADRANT COMPARISON:  None Available. FINDINGS: Gallbladder: No gallstones or wall thickening visualized. No sonographic Murphy sign noted by sonographer. Common bile duct: Diameter: 6 mm-9 mm in left lateral decubitus. No intrahepatic biliary ductal dilatation. Liver: No focal lesion identified. Within normal limits in parenchymal echogenicity. Portal vein is patent on color Doppler imaging with normal direction of blood flow towards the liver. Other: None. IMPRESSION: 1. No cholelithiasis or sonographic evidence for acute cholecystitis. 2. Common bile duct is mildly dilated measuring up to 9 mm. Recommend correlation with LFTs. This can be further evaluated with ERCP or MRCP. Electronically Signed   By: Tyron Gallon M.D.   On: 12/18/2023 15:05    Orders Placed This Encounter  Procedures   Ambulatory referral to Surgical Oncology    Referral Priority:   Urgent    Referral Type:   Surgical    Referral Reason:   Specialty Services Required    Requested Specialty:   Surgical Oncology    Number of Visits Requested:   1     CODE STATUS:  Code Status History     Date Active Date Inactive Code Status Order ID Comments User Context   12/18/2023 1558 12/23/2023 2029 Full Code [id number]  Arne Langdon, MD ED   09/12/2023 2330 09/13/2023 1728 Limited: Do not attempt resuscitation (DNR) -DNR-LIMITED -Do Not Intubate/DNI  [id number]  Vita Grip, MD ED   11/24/2013 1103 11/26/2013 2046 Full Code [id number]  Almira Jaeger, MD Inpatient    Questions for Most Recent Historical Code Status (Order [id number])     Question Answer   By: Consent: discussion documented in EHR            Future Appointments  Date Time Provider Department Center  02/10/2024  9:50 AM Armbruster, Lendon Queen, MD [facility]     I spent a total of 60 minutes during this encounter with the patient including review of chart and various tests results, discussions about plan of care and coordination of care plan.  This document was completed utilizing speech recognition software. Grammatical errors, random word insertions, pronoun errors, and incomplete sentences are an occasional consequence of this system due to software limitations, ambient noise, and hardware issues. Any formal questions or concerns about the content, text or information contained within the body of this dictation should be directly addressed to the provider for clarification.

## 2023-12-29 NOTE — Assessment & Plan Note (Addendum)
 Pain localized under the right breast, potentially referred pain from the plastic stent placed in the common bile duct. The stent may cause irritation and referred pain to the diaphragm area and shoulder. The pain has increased significantly, raising concerns about possible infection or inflammation. - Transfer to the emergency room for pain management and further evaluation. - She may end up needing imaging with CT or MRI to assess for infection or inflammation.

## 2024-01-01 NOTE — Progress Notes (Signed)
   This encounter was created in error - please disregard. No show

## 2024-01-02 ENCOUNTER — Ambulatory Visit: Payer: Self-pay | Admitting: Family

## 2024-01-03 LAB — CULTURE, BLOOD (ROUTINE X 2)
Culture: NO GROWTH
Culture: NO GROWTH
Special Requests: ADEQUATE

## 2024-01-04 ENCOUNTER — Other Ambulatory Visit: Payer: Self-pay

## 2024-01-04 DIAGNOSIS — C240 Malignant neoplasm of extrahepatic bile duct: Secondary | ICD-10-CM

## 2024-01-04 NOTE — Progress Notes (Signed)
 PATIENT NAVIGATOR PROGRESS NOTE  Name: Caroline Snyder Date: 01/04/2024 MRN: [id number]  DOB: Jul 01, 1941   Called and spoke to patient's son Octavia Belton to inform him that a referral has been submitted for the patient to be evaluated at [facility] to determine her eligibility for surgery. He was advised that our office will follow up with the patient after she has been seen by [facility].  The patient's son verbalized understanding and agreed to contact our office with any questions or concerns.  Referral, patient demographics, and last office visit note was faxed to [facility].    Time spent counseling/coordinating care: 30-45 minutes

## 2024-01-05 ENCOUNTER — Telehealth: Payer: Self-pay

## 2024-01-05 NOTE — Telephone Encounter (Signed)
-----   Message from Nurse Aiysha Jillson P sent at 12/22/2023 10:48 AM EDT ----- Pt needs KUB

## 2024-01-05 NOTE — Progress Notes (Signed)
 The proposed treatment discussed in conference is for discussion purpose only and is not a binding recommendation.  The patients have not been physically examined, or presented with their treatment options.  Therefore, final treatment plans cannot be decided.

## 2024-01-05 NOTE — Telephone Encounter (Signed)
 The pt son has been notified that the pt is due for Xray. He will bring her this week.

## 2024-01-06 ENCOUNTER — Ambulatory Visit (INDEPENDENT_AMBULATORY_CARE_PROVIDER_SITE_OTHER)
Admission: RE | Admit: 2024-01-06 | Discharge: 2024-01-06 | Disposition: A | Discharge status: Home / Self Care | Payer: Medicare (Managed Care) | Source: Ambulatory Visit | Attending: Gastroenterology | Admitting: Gastroenterology

## 2024-01-06 DIAGNOSIS — Z48815 Encounter for surgical aftercare following surgery on the digestive system: Secondary | ICD-10-CM

## 2024-01-06 DIAGNOSIS — T85528A Displacement of other gastrointestinal prosthetic devices, implants and grafts, initial encounter: Secondary | ICD-10-CM

## 2024-01-06 DIAGNOSIS — K838 Other specified diseases of biliary tract: Secondary | ICD-10-CM

## 2024-01-14 ENCOUNTER — Ambulatory Visit: Payer: Self-pay | Admitting: Gastroenterology

## 2024-01-18 DIAGNOSIS — C221 Intrahepatic bile duct carcinoma: Secondary | ICD-10-CM | POA: Diagnosis not present

## 2024-01-19 NOTE — Progress Notes (Signed)
 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   HISTORY OF PRESENT ILLNESS: Caroline Snyder is a 83 y.o. female seen at the request of Dr. Dasie for a diagnosis of a biliary carcinoma involving the common bile duct. The patient originally presented on Dec 18, 2023 after complaints of abdominal pain with elevated LFTs which led to an ultrasound of the right upper quadrant and showed intrahepatic biliary ductal dilatation and MRCP showed moderate to severe intrahepatic biliary ductal dilatation with abrupt narrowing of the common bile duct at the level of the head of the pancreas with diffuse parenchymal pancreatic edema and mild increase in the caliber of the main pancreatic duct focal increased T2 signal with restricted diffusion in the anterior head of the pancreas measured 2.2 cm.  She underwent ERCP and EUS on 12/21/2023 with Dr. Wilhelmenia which showed a mass in the middle third of the main bile duct and fine-needle biopsy performed showed adenocarcinoma.  The pancreatic parenchyma had abnormalities of lobularity and stranding but no overt mass was appreciated and placement of biliary and pancreatic duct stents were performed.  The patient had CT scans of the chest as well as abdomen and pelvis May 22 and  May 29, no evidence of metastatic disease was appreciated in the chest, and CT abdomen pelvis with contrast showed an intact common bile duct stent and resolution of the ductal dilatation seen previously and soft tissue thickening involving the proximal common bile duct surrounding stent without evidence of metastatic disease.  Her CA 19-9 was 180.  She was seen by Dr. Dasie to consider Whipple procedure.  And  reported that her pain has improved since her stenting procedure.  She is felt to be a candidate for Whipple surgery but is also interested in alternatives and is seen today to consider options of radiotherapy.    PREVIOUS RADIATION THERAPY: No   PAST MEDICAL HISTORY:  Past Medical History:  Diagnosis Date   Abnormal CT of the chest 11/24/2013   See CT chest 11/22/13  1. No CT evidence of pulmonary arterial embolic disease.   2. Enlarged lymph nodes in the right hilar and subcarinal region.   These may represent reactive lymph nodes, clinical correlation   recommended. There is otherwise no evidence of mediastinal or   parenchymal masses or nodules nor infiltrates.   3. Atelectasis versus scarring within the lung bases as well as   interst   Acute encephalopathy 09/13/2023   Allergy    Altered mental status 09/13/2023   Benign neoplasm of colon 02/11/2012   Bunion 07/01/2010   Closed nondisplaced fracture of distal phalanx of right thumb 08-07-20   Death of child 2016/09/16   Adult son truck driver died 87/86/82 in accident.     Depression 05/05/2007   External hemorrhoids without mention of complication 03/24/1999   GERD (gastroesophageal reflux disease) 08/02/1998   Hyperlipidemia 06/29/2006   Hypertensive emergency 09/13/2023   Memory loss 04/15/2003   Migraine without aura, without mention of intractable migraine without mention of status migrainosus 03/24/1999   Osteoporosis, unspecified 04/15/2004   Tear film insufficiency, unspecified 03/24/1999   Unspecified essential hypertension 11/22/2007  PAST SURGICAL HISTORY: Past Surgical History:  Procedure Laterality Date   COLONOSCOPY  06-16-2007   Internal hemorrhoids,laxity of anal sphincter,polyp at 25cm form anal verge, tortuous sigmoid colon. Dr.Orr    ERCP N/A 12/21/2023   Procedure: ERCP, WITH INTERVENTION IF INDICATED;  Surgeon: Wilhelmenia Aloha Raddle., MD;  Location: [facility] ENDOSCOPY;  Service: Gastroenterology;   Laterality: N/A;   ESOPHAGOGASTRODUODENOSCOPY N/A 12/21/2023   Procedure: EGD (ESOPHAGOGASTRODUODENOSCOPY);  Surgeon: Wilhelmenia Aloha Raddle., MD;  Location: [facility] ENDOSCOPY;  Service: Gastroenterology;  Laterality: N/A;   EUS N/A 12/21/2023   Procedure: ULTRASOUND, UPPER GI TRACT, ENDOSCOPIC;  Surgeon: Wilhelmenia Aloha Raddle., MD;  Location: [facility] ENDOSCOPY;  Service: Gastroenterology;  Laterality: N/A;   EYE SURGERY Bilateral 2010   cataract Dr. Camillo   FINE NEEDLE ASPIRATION  12/21/2023   Procedure: FINE NEEDLE ASPIRATION;  Surgeon: Wilhelmenia Aloha Raddle., MD;  Location: [facility] ENDOSCOPY;  Service: Gastroenterology;;   VAGINAL HYSTERECTOMY  1980     FAMILY HISTORY:  Family History  Problem Relation Age of Onset   Alzheimer's disease Mother    Diabetes Mother    Transient ischemic attack Mother    Heart disease Mother        MI, CHF   Alcohol  abuse Father    Kidney disease Father    Hypertension Sister    Hypertension Brother    Heart disease Brother    Hypertension Brother    Hypertension Sister    Hypertension Brother    Other Son        truck accident 07/2016   Colon cancer Neg Hx    Stomach cancer Neg Hx      SOCIAL HISTORY:  reports that she has never smoked. She has never used smokeless tobacco. She reports that she does not drink alcohol  and does not use drugs.   ALLERGIES: Aricept  [donepezil ] and Vioxx [rofecoxib]   MEDICATIONS:  Current Outpatient Medications  Medication Sig Dispense Refill   acetaminophen  (TYLENOL ) 500 MG tablet Take 500 mg by mouth in the morning and at bedtime.     aspirin  EC 81 MG tablet Take 1 tablet (81 mg total) by mouth daily. Swallow whole. (Patient taking differently: Take 81 mg by mouth in the morning. Swallow whole.) 30 tablet 11   losartan  (COZAAR ) 100 MG tablet Take 1 tablet (100 mg total) by mouth daily. Essential hypertension I10 90 tablet 3   Multiple Vitamins-Minerals (PRESERVISION AREDS 2) CAPS Take 1 capsule by mouth daily.      rosuvastatin  (CRESTOR ) 20 MG tablet Take 0.5 tablets (10 mg total) by mouth daily.     Amoxicill-Rifabutin-Omeprazole (TALICIA ) 250-12.5-10 MG CPDR Take 4 capsules by mouth in the morning, at noon, and at bedtime. (Patient not taking: Reported on 01/24/2024) 168 capsule 0   ondansetron  (ZOFRAN ) 4 MG tablet Take 1 tablet (4 mg total) by mouth every 8 (eight) hours as needed for nausea or vomiting. 20 tablet 0   pantoprazole  (PROTONIX ) 40 MG tablet Take 1 tablet (40 mg total) by mouth daily. (Patient not taking: Reported on 01/24/2024) 30 tablet 2   sucralfate  (CARAFATE ) 1 g tablet Take 1 tablet (1 g total) by mouth 2 (two) times daily. (Patient not taking: Reported on 01/24/2024) 60 tablet 0   Current Facility-Administered Medications  Medication Dose Route Frequency Provider Last Rate Last Admin   denosumab  (PROLIA ) injection 60 mg  60 mg Subcutaneous Once Eubanks, Jessica K, NP         REVIEW OF SYSTEMS: On review of systems, the patient reports  that she  is doing well overall. She has occasional nausea, but has maintained a stable weight. She denies any difficulty with eating or appetite. She has had some right breast discomfort intermittently and has not had a mammogram in the last year. She denies any abdominal pain. She does have occasional diarrhea, but this is much improved from previous. No other complaints are verbalized.    PHYSICAL EXAM:  Wt Readings from Last 3 Encounters:  01/24/24 97 lb (44 kg)  12/29/23 95 lb 6.4 oz (43.3 kg)  12/29/23 95 lb 6.4 oz (43.3 kg)   Temp Readings from Last 3 Encounters:  01/24/24 (!) 97.4 F (36.3 C)  12/29/23 97.9 F (36.6 C) (Oral)  12/29/23 97.6 F (36.4 C) (Temporal)   BP Readings from Last 3 Encounters:  01/24/24 (!) 141/72  12/29/23 126/69  12/29/23 (!) 142/76   Pulse Readings from Last 3 Encounters:  01/24/24 65  12/29/23 62  12/29/23 68   Pain Assessment Pain Score: 0-No pain/10  In general this is a thin elderly caucasian  female in no acute distress. She's alert and oriented x4 and appropriate throughout the examination. Cardiopulmonary assessment is negative for acute distress and she exhibits normal effort.     ECOG = 1  0 - Asymptomatic (Fully active, able to carry on all predisease activities without restriction)  1 - Symptomatic but completely ambulatory (Restricted in physically strenuous activity but ambulatory and able to carry out work of a light or sedentary nature. For example, light housework, office work)  2 - Symptomatic, <50% in bed during the day (Ambulatory and capable of all self care but unable to carry out any work activities. Up and about more than 50% of waking hours)  3 - Symptomatic, >50% in bed, but not bedbound (Capable of only limited self-care, confined to bed or chair 50% or more of waking hours)  4 - Bedbound (Completely disabled. Cannot carry on any self-care. Totally confined to bed or chair)  5 - Death   Raylene MM, Creech RH, Tormey DC, et al. [phone]). Toxicity and response criteria of the [facility]. Am. DOROTHA Bridges. Oncol. 5 (6): 649-55    LABORATORY DATA:  Lab Results  Component Value Date   WBC 5.8 12/29/2023   HGB 12.9 12/29/2023   HCT 38.9 12/29/2023   MCV 92.8 12/29/2023   PLT 309 12/29/2023   Lab Results  Component Value Date   NA 134 (L) 12/29/2023   K 4.4 12/29/2023   CL 100 12/29/2023   CO2 27 12/29/2023   Lab Results  Component Value Date   ALT 117 (H) 12/29/2023   AST 54 (H) 12/29/2023   ALKPHOS 165 (H) 12/29/2023   BILITOT 0.8 12/29/2023      RADIOGRAPHY: DG Abd 2 Views Result Date: 01/12/2024 CLINICAL DATA:  Follow-up bile duct stent placement EXAM: ABDOMEN - 2 VIEW COMPARISON:  12/26/2023 FINDINGS: Biliary duct stent is again identified and stable. The pancreatic duct stent seen previously has dislodged and is not visible on these images. It is likely passed. No free air is seen. No other focal abnormality is noted.  IMPRESSION: Stable biliary duct stent. Pancreatic stent has passed. Electronically Signed   By: Oneil Devonshire M.D.   On: 01/12/2024 01:25   CT ABDOMEN PELVIS W CONTRAST Result Date: 12/29/2023 CLINICAL DATA:  Right flank pain. Common bile duct adenocarcinoma. Internal biliary stent placement. * Tracking Code: BO * EXAM: CT ABDOMEN AND PELVIS WITH CONTRAST TECHNIQUE: Multidetector CT  imaging of the abdomen and pelvis was performed using the standard protocol following bolus administration of intravenous contrast. RADIATION DOSE REDUCTION: This exam was performed according to the departmental dose-optimization program which includes automated exposure control, adjustment of the mA and/or kV according to patient size and/or use of iterative reconstruction technique. CONTRAST:  OMNIPAQUE  IOHEXOL  300 MG/ML  SOLN COMPARISON:  MRI on 12/18/2023 FINDINGS: Lower Chest: No acute findings. Hepatobiliary: Mild diffuse hepatic steatosis. No hepatic masses identified. Gallbladder is collapsed. Internal common bile duct stent is now seen in place with resolution of diffuse biliary ductal dilatation since previous study. Soft tissue thickening is seen involving the proximal common bile duct in the porta hepatis surrounding the biliary stent. This is consistent with known bile duct adenocarcinoma/cholangiocarcinoma. Pancreas: No pancreatic mass identified. No evidence of pancreatitis or pancreatic ductal dilatation. Spleen: Within normal limits in size and appearance. Adrenals/Urinary Tract: No suspicious masses identified. No evidence of ureteral calculi or hydronephrosis. Stomach/Bowel: No evidence of obstruction, inflammatory process or abnormal fluid collections. Diverticulosis is seen mainly involving the sigmoid colon, however there is no evidence of diverticulitis. Normal appendix visualized. Large colonic stool burden noted. Vascular/Lymphatic: No pathologically enlarged lymph nodes. No acute vascular findings.  Circumaortic left renal vein incidentally. Reproductive: Prior hysterectomy noted. Adnexal regions are unremarkable in appearance. Other:  None. Musculoskeletal:  No suspicious bone lesions identified. IMPRESSION: Internal common bile duct stent in place, with resolution of diffuse biliary ductal dilatation since previous study. Soft tissue thickening involving the proximal common bile duct surrounding stent, consistent with known bile duct adenocarcinoma/cholangiocarcinoma. No evidence of metastatic disease. Mild hepatic steatosis. Colonic diverticulosis, without radiographic evidence of diverticulitis. Large colonic stool burden noted; recommend clinical correlation for possible constipation. Electronically Signed   By: Norleen DELENA Kil M.D.   On: 12/29/2023 17:47   DG Abdomen 1 View Result Date: 12/26/2023 CLINICAL DATA:  Constipation EXAM: ABDOMEN - 1 VIEW COMPARISON:  12/21/2023 FINDINGS: Supine frontal view of the abdomen and pelvis demonstrates an unremarkable bowel gas pattern with no obstruction or ileus. There is moderate retained stool within the distal colon. Common bile duct stent unchanged since recent MRCP. The orientation of the pancreatic duct stent placed previously suggests passage into the duodenal lumen. No masses or abnormal calcifications. No acute bony abnormalities. IMPRESSION: 1. Moderate fecal retention consistent with constipation. No bowel obstruction or ileus. 2. Stable common bile duct stent. However, the pancreatic duct stent has likely passed into the duodenal lumen. Electronically Signed   By: Ozell Daring M.D.   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 Dr. Dewey discusses the pathology findings and reviews the nature of biliary cancers. While she is a candidate for Whipple procedure, she is considering alternatives and Dr. Dewey discussed the option to consider chemoradiation. The goals would be  to delay progression of disease with this therapy.  We discussed the risks, benefits, short, and long term effects of radiotherapy, as well as the definitive dosing intent, but overall expectations from this type of disease, and the patient is leaning toward a less invasive approach. She is going to take time over the next week to decide how she'd like to proceed. She is returning on 02/02/24 to see Dr. Autumn and would plan to share her decision making at that time. Dr. Dewey discusses the delivery and logistics of radiotherapy and anticipates a course of 5 1/2 weeks of radiotherapy and we will  hold a simulation appointment for her on 02/02/24. That can be cancelled if she decides to forgo radiation.  2. Biliary obstruction/Ampullary obstruction. The patient has undergone plastic stent placement. If she decides to forgo surgery she will need her stenting modality changed to metal and we will notify her GI team of our plans as well to tentatively proceed with chemoRT starting on 02/13/24, and would prefer stent placement prior to then if possible. However she would not proceed in this manner if she were electing for surgery.  3. Right breast pain. While this is likely neurologic in nature we did encourage her to also have mammogram updated.     In a visit lasting 60 minutes, greater than 50% of the time was spent face to face discussing the patient's condition, in preparation for the discussion, and coordinating the patient's care.   The above documentation reflects my direct findings during this shared patient visit. Please see the separate note by Dr. Dewey on this date for the remainder of the patient's plan of care.    Donald KYM Husband, [facility]   **Disclaimer: This note was dictated with voice recognition software. Similar sounding words can inadvertently be transcribed and this note may contain transcription errors which may not have been corrected upon publication of note.**

## 2024-01-20 NOTE — Progress Notes (Incomplete)
 GI Location of Tumor / Histology: Biliary carcinoma involving the common bile Duct  Caroline Snyder presented *** months ago with symptoms of: ***   The patient Originally presented on Dec 18, 2023 after complaints of abdominal pain with elevated LFTs which led to an ultrasound of the right upper quadrant and showed intrahepatic biliary ductal dilatation and MRCP showed moderate to severe intrahepatic biliary ductal dilatation with abrupt narrowing of the common bile duct at the level of the head of the pancreas with diffuse parenchymal pancreatic edema and mild increase in the caliber of the main pancreatic duct focal increased T2 signal with restricted diffusion in the anterior head of the pancreas measured 2.2 cm.  She underwent ERCP and EUS on 12/21/2023 with Dr. Brice Campi which showed a mass in the middle third of the main bile duct and fine-needle biopsy performed showed adenocarcinoma.  The pancreatic parenchyma had abnormalities of lobularity and stranding but no overt mass was appreciated and placement of biliary and pancreatic duct stents were performed.  The patient had CT scans of the chest as well as abdomen and pelvis May 22 in May 29, no evidence of metastatic disease was appreciated in the chest, and CT abdomen pelvis with contrast showed an intact common bile duct stent and resolution of the ductal dilatation seen previously and soft tissue thickening involving the proximal common bile duct surrounding stent without evidence of metastatic disease.  Her CA 19-9 was 180.  She was seen by Dr. Leighton Punches to consider Whipple procedure.  And reported that her pain has improved since her stenting procedure.  She is felt to be a candidate for Whipple surgery but is also interested in alternatives and is seen today to consider options of radiotherapy.      Biopsies of *** (if applicable) revealed: {:18581}  Past/Anticipated interventions by surgeon, if any: {:18581}  Past/Anticipated  interventions by medical oncology, if any: {:18581}  Weight changes, if any: {:18581}  Bowel/Bladder complaints, if any: {:18581}  Nausea / Vomiting, if any: {:18581}  Pain issues, if any:  {:18581}  Any blood per rectum:   {:18581}  Anal Patient -Abnormal pap smear  SAFETY ISSUES: Prior radiation? {:18581} Pacemaker/ICD? {:18581} Possible current pregnancy? {:18581} Is the patient on methotrexate? {:18581}  Current Complaints/Details:

## 2024-01-24 ENCOUNTER — Encounter: Payer: Self-pay | Admitting: Radiation Oncology

## 2024-01-24 ENCOUNTER — Ambulatory Visit
Admission: RE | Admit: 2024-01-24 | Discharge: 2024-01-24 | Discharge status: Home / Self Care | Source: Ambulatory Visit | Attending: Radiation Oncology | Admitting: Radiation Oncology

## 2024-01-24 ENCOUNTER — Ambulatory Visit
Admission: RE | Admit: 2024-01-24 | Discharge: 2024-01-24 | Disposition: A | Discharge status: Home / Self Care | Payer: Medicare (Managed Care) | Source: Ambulatory Visit | Attending: Radiation Oncology | Admitting: Radiation Oncology

## 2024-01-24 VITALS — BP 141/72 | HR 65 | Temp 97.4°F | Resp 20 | Ht 60.0 in | Wt 97.0 lb

## 2024-01-24 DIAGNOSIS — K831 Obstruction of bile duct: Secondary | ICD-10-CM | POA: Insufficient documentation

## 2024-01-24 DIAGNOSIS — M810 Age-related osteoporosis without current pathological fracture: Secondary | ICD-10-CM | POA: Insufficient documentation

## 2024-01-24 DIAGNOSIS — Z7982 Long term (current) use of aspirin: Secondary | ICD-10-CM | POA: Diagnosis not present

## 2024-01-24 DIAGNOSIS — Z860101 Personal history of adenomatous and serrated colon polyps: Secondary | ICD-10-CM | POA: Diagnosis not present

## 2024-01-24 DIAGNOSIS — E785 Hyperlipidemia, unspecified: Secondary | ICD-10-CM | POA: Diagnosis not present

## 2024-01-24 DIAGNOSIS — Z90710 Acquired absence of both cervix and uterus: Secondary | ICD-10-CM | POA: Diagnosis not present

## 2024-01-24 DIAGNOSIS — N644 Mastodynia: Secondary | ICD-10-CM | POA: Insufficient documentation

## 2024-01-24 DIAGNOSIS — I10 Essential (primary) hypertension: Secondary | ICD-10-CM | POA: Diagnosis not present

## 2024-01-24 DIAGNOSIS — R590 Localized enlarged lymph nodes: Secondary | ICD-10-CM | POA: Diagnosis not present

## 2024-01-24 DIAGNOSIS — C240 Malignant neoplasm of extrahepatic bile duct: Secondary | ICD-10-CM

## 2024-01-24 DIAGNOSIS — K5730 Diverticulosis of large intestine without perforation or abscess without bleeding: Secondary | ICD-10-CM | POA: Diagnosis not present

## 2024-01-24 DIAGNOSIS — Z79899 Other long term (current) drug therapy: Secondary | ICD-10-CM | POA: Insufficient documentation

## 2024-01-24 DIAGNOSIS — K760 Fatty (change of) liver, not elsewhere classified: Secondary | ICD-10-CM | POA: Insufficient documentation

## 2024-01-24 DIAGNOSIS — K219 Gastro-esophageal reflux disease without esophagitis: Secondary | ICD-10-CM | POA: Diagnosis not present

## 2024-01-25 ENCOUNTER — Telehealth: Payer: Self-pay

## 2024-01-25 ENCOUNTER — Telehealth: Payer: Self-pay | Admitting: *Deleted

## 2024-01-25 DIAGNOSIS — C240 Malignant neoplasm of extrahepatic bile duct: Secondary | ICD-10-CM | POA: Diagnosis not present

## 2024-01-25 NOTE — Telephone Encounter (Signed)
 Caroline Donald Stagger, PA-C  Mansouraty, Aloha Raddle., MD; Anitra Odetta CROME, RN Thank you we will plan to keep our plans as they are. Rogen Porte if something changes, I'll be out of the office next week but could you call me on my cell [phone]?

## 2024-01-25 NOTE — Telephone Encounter (Signed)
-----   Message from [facility] sent at 01/24/2024  5:02 PM EDT ----- ACP, Thanks for update. I do not have any availability for that to occur before the initiation of chemo-radiation. If she is doing well, would try to just maintain her current biliary stent. We can have her as my next available or if someone cancels to be on the look out. I was going to work on scheduling her for 3 to 6 months, so my nurse Odetta can work on putting her for next available ERCP slot.  Demontre Padin, As soon as we have the first available ERCP slot, please put this patient on that date. We can keep in mind, if we have a cancellation for ERCP between now and then and get her on if that works. Thanks. GM ----- Message ----- From: Lanell Donald Stagger, PA-C Sent: 01/24/2024  11:04 AM EDT To: Leonor LITTIE Dawn, MD; Elspeth SHAUNNA Naval, MD;#  Hi all- We met Ms. Hoeschen and her family today. She is leaning toward chemoRT over 5 1/2 weeks. She is going to the beach this Wednesday through the weekend and is going to make a decision while she's gone, and let Dr. Conchetta know when she comes back to see him on 7/3.   We talked about holding a spot for treatment planning after her appt with Dr. Conchetta on 7/3. If she decides to proceed with chemoRT, she was asking when her stent should be exchanged for metal. We would plan to start radiation if everyone is in agreement on 7/14, hopefully that time frame would work for stent exchange prior to chemoRT.  Thanks, Donald

## 2024-01-25 NOTE — Telephone Encounter (Signed)
 Left a voicemail for the patient's son to let him know that we heard back from Dr. Melba office and they do not have any open appointments at the time to switch out her stent.  She has been placed on the cancellation list.  If nothing comes available prior to radiation simulation this will be done after radiation treatments are complete.  Call back number left [phone]) for questions.

## 2024-01-27 ENCOUNTER — Other Ambulatory Visit: Payer: Self-pay

## 2024-01-27 DIAGNOSIS — K838 Other specified diseases of biliary tract: Secondary | ICD-10-CM

## 2024-01-27 DIAGNOSIS — T85528A Displacement of other gastrointestinal prosthetic devices, implants and grafts, initial encounter: Secondary | ICD-10-CM

## 2024-01-27 NOTE — Telephone Encounter (Signed)
 ERCP has been added for 04/16/24 at [facility] with GM at 8 am

## 2024-01-27 NOTE — Telephone Encounter (Signed)
 Left message on machine to call back

## 2024-01-27 NOTE — Telephone Encounter (Signed)
 ERCP scheduled, pt son  instructed and medications reviewed.  Patient instructions mailed to home.  Patient to call with any questions or concerns.

## 2024-02-01 ENCOUNTER — Other Ambulatory Visit: Payer: Self-pay | Admitting: Oncology

## 2024-02-01 DIAGNOSIS — C240 Malignant neoplasm of extrahepatic bile duct: Secondary | ICD-10-CM

## 2024-02-01 NOTE — Progress Notes (Signed)
 Has armband been applied?  {yes no:314532}  Does patient have an allergy to IV contrast dye?: {yes no:314532}   Has patient ever received premedication for IV contrast dye?: {yes no:314532}   Date of lab work: February 02, 2024 BUN: *** CR: *** eGFR: ***  Does patient take metformin?: {yes no:314532}  Is eGFR >60?: {yes no:314532} If no, when can patient resume? (Must be 48 hrs AFTER they receive IV contrast):  {Time; dates multiple:15870}  IV site: {iv locations:314275}  Has IV site been added to flowsheet?  {yes no:314532}  There were no vitals taken for this visit.

## 2024-02-02 ENCOUNTER — Telehealth: Payer: Self-pay | Admitting: Pharmacy Technician

## 2024-02-02 ENCOUNTER — Inpatient Hospital Stay (HOSPITAL_BASED_OUTPATIENT_CLINIC_OR_DEPARTMENT_OTHER): Payer: Medicare (Managed Care) | Admitting: Oncology

## 2024-02-02 ENCOUNTER — Other Ambulatory Visit (HOSPITAL_COMMUNITY): Payer: Self-pay

## 2024-02-02 ENCOUNTER — Encounter: Payer: Self-pay | Admitting: Oncology

## 2024-02-02 ENCOUNTER — Ambulatory Visit
Admission: RE | Admit: 2024-02-02 | Discharge: 2024-02-02 | Disposition: A | Discharge status: Home / Self Care | Payer: Medicare (Managed Care) | Source: Ambulatory Visit | Attending: Radiation Oncology | Admitting: Radiation Oncology

## 2024-02-02 ENCOUNTER — Inpatient Hospital Stay: Payer: Medicare (Managed Care) | Attending: Oncology

## 2024-02-02 ENCOUNTER — Telehealth: Payer: Self-pay

## 2024-02-02 VITALS — BP 155/75 | HR 63 | Temp 96.8°F | Resp 18 | Ht 60.0 in | Wt 97.2 lb

## 2024-02-02 DIAGNOSIS — K5900 Constipation, unspecified: Secondary | ICD-10-CM

## 2024-02-02 DIAGNOSIS — C248 Malignant neoplasm of overlapping sites of biliary tract: Secondary | ICD-10-CM | POA: Diagnosis not present

## 2024-02-02 DIAGNOSIS — I10 Essential (primary) hypertension: Secondary | ICD-10-CM | POA: Diagnosis not present

## 2024-02-02 DIAGNOSIS — C221 Intrahepatic bile duct carcinoma: Secondary | ICD-10-CM | POA: Diagnosis not present

## 2024-02-02 DIAGNOSIS — K219 Gastro-esophageal reflux disease without esophagitis: Secondary | ICD-10-CM | POA: Insufficient documentation

## 2024-02-02 DIAGNOSIS — Z510 Encounter for antineoplastic radiation therapy: Secondary | ICD-10-CM | POA: Insufficient documentation

## 2024-02-02 DIAGNOSIS — Z79899 Other long term (current) drug therapy: Secondary | ICD-10-CM | POA: Insufficient documentation

## 2024-02-02 DIAGNOSIS — E785 Hyperlipidemia, unspecified: Secondary | ICD-10-CM | POA: Insufficient documentation

## 2024-02-02 DIAGNOSIS — F0150 Vascular dementia without behavioral disturbance: Secondary | ICD-10-CM | POA: Diagnosis not present

## 2024-02-02 DIAGNOSIS — C240 Malignant neoplasm of extrahepatic bile duct: Secondary | ICD-10-CM | POA: Insufficient documentation

## 2024-02-02 DIAGNOSIS — R978 Other abnormal tumor markers: Secondary | ICD-10-CM | POA: Insufficient documentation

## 2024-02-02 DIAGNOSIS — B9681 Helicobacter pylori [H. pylori] as the cause of diseases classified elsewhere: Secondary | ICD-10-CM | POA: Diagnosis not present

## 2024-02-02 DIAGNOSIS — Z7982 Long term (current) use of aspirin: Secondary | ICD-10-CM | POA: Diagnosis not present

## 2024-02-02 DIAGNOSIS — R609 Edema, unspecified: Secondary | ICD-10-CM | POA: Diagnosis not present

## 2024-02-02 LAB — CBC WITH DIFFERENTIAL (CANCER CENTER ONLY)
Abs Immature Granulocytes: 0.01 10*3/uL (ref 0.00–0.07)
Basophils Absolute: 0.1 10*3/uL (ref 0.0–0.1)
Basophils Relative: 1 %
Eosinophils Absolute: 0.1 10*3/uL (ref 0.0–0.5)
Eosinophils Relative: 2 %
HCT: 34.7 % — ABNORMAL LOW (ref 36.0–46.0)
Hemoglobin: 11.9 g/dL — ABNORMAL LOW (ref 12.0–15.0)
Immature Granulocytes: 0 %
Lymphocytes Relative: 22 %
Lymphs Abs: 1.3 10*3/uL (ref 0.7–4.0)
MCH: 30.7 pg (ref 26.0–34.0)
MCHC: 34.3 g/dL (ref 30.0–36.0)
MCV: 89.4 fL (ref 80.0–100.0)
Monocytes Absolute: 0.4 10*3/uL (ref 0.1–1.0)
Monocytes Relative: 8 %
Neutro Abs: 3.8 10*3/uL (ref 1.7–7.7)
Neutrophils Relative %: 67 %
Platelet Count: 275 10*3/uL (ref 150–400)
RBC: 3.88 MIL/uL (ref 3.87–5.11)
RDW: 12.8 % (ref 11.5–15.5)
WBC Count: 5.7 10*3/uL (ref 4.0–10.5)
nRBC: 0 % (ref 0.0–0.2)

## 2024-02-02 LAB — CMP (CANCER CENTER ONLY)
ALT: 17 U/L (ref 0–44)
AST: 22 U/L (ref 15–41)
Albumin: 4.2 g/dL (ref 3.5–5.0)
Alkaline Phosphatase: 61 U/L (ref 38–126)
Anion gap: 7 (ref 5–15)
BUN: 20 mg/dL (ref 8–23)
CO2: 26 mmol/L (ref 22–32)
Calcium: 9.7 mg/dL (ref 8.9–10.3)
Chloride: 107 mmol/L (ref 98–111)
Creatinine: 0.93 mg/dL (ref 0.44–1.00)
GFR, Estimated: >60 mL/min
Glucose, Bld: 110 mg/dL — ABNORMAL HIGH (ref 70–99)
Potassium: 4.2 mmol/L (ref 3.5–5.1)
Sodium: 140 mmol/L (ref 135–145)
Total Bilirubin: 0.4 mg/dL (ref 0.0–1.2)
Total Protein: 6.9 g/dL (ref 6.5–8.1)

## 2024-02-02 LAB — CEA (ACCESS): CEA (CHCC): 7.29 ng/mL — ABNORMAL HIGH (ref 0.00–5.00)

## 2024-02-02 MED ORDER — CAPECITABINE 500 MG PO TABS: ORAL_TABLET | ORAL | 0 refills | Status: DC

## 2024-02-02 NOTE — Progress Notes (Signed)
 Patient is established with a treatment plan and is actively engaged in care. Nurse Navigator services not currently indicated at this time. Will re-evaluate if needs change or if additional support is requested.

## 2024-02-02 NOTE — Telephone Encounter (Signed)
 Oral Oncology Pharmacist Encounter  Received new prescription for Xeloda (capecitabine) for the treatment of primary cholangiocarcinoma of extrahepatic bile duct in conjunction with radiation, planned duration until disease progression or unacceptable toxicity or until radiation is completed (~5 weeks).  Labs from 02/02/2024 (CBC, CMP) assessed, creatinine of 0.93 but due to age and weight, patients creatinine clearance is 31.34. Discussed with MD and proceeding with 800mg /m2 dose at 1000mg  BID on radiation days with close follow up. Patient will be seen by MD weekly and we will be doing weekly labs to monitor renal function and side effect management closely to follow an accumulation of medication. I will follow patients weekly labs.  Prescription dose and frequency assessed for appropriateness. Prescription edited to include for patient to take with food.    Current medication list in Epic reviewed, DDIs with Xeloda identified: - denosumab : patient received for osteoporosis. Patient receives every 6 months and last dose was in Feb 2025. Will notify patient that she may need to delay next dose if she is still receiving xeloda/ radiation at that time. Prolia  may increase the immunosuppressant effects of xeloda.  Evaluated chart and no patient barriers to medication adherence noted.   Prescription has been e-scribed to the [facility] for benefits analysis and approval.  Oral Oncology Clinic will continue to follow for insurance authorization, copayment issues, initial counseling and start date.  Carron Jaggi, PharmD Hematology/Oncology Clinical Pharmacist [facility] Oral Chemotherapy [facility] [phone] 02/02/2024 1:58 PM

## 2024-02-02 NOTE — Assessment & Plan Note (Signed)
 Constipation, currently well-managed. Bowel movements were regular until recent travel. - Monitor bowel movements closely. - Use Dulcolax or Miralax if constipation recurs.

## 2024-02-02 NOTE — Assessment & Plan Note (Addendum)
 Please review oncology history for additional details and timeline of events.    On 12/18/2023, MRCP showed moderate to severe intrahepatic bile duct dilatation.  There was an abrupt beak like narrowing of the mid common bile duct at the level of the head of the pancreas.  Findings concerning for stricture at the level of head of pancreas.    CA 19-9 was increased at 180 on 12/19/2023.  On 12/21/2023, Dr. Wilhelmenia performed ERCP.  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  Stricture was malignant appearing.  The upper third of the main bile duct, left main hepatic duct and right main hepatic duct were moderately dilated, secondary to a stricture.  Extremely difficult cannulation requiring attempt at double wire, over pancreatic stent, precut fistulotomy to eventually gain biliary access. One temporary plastic biliary stent was placed into the ventral pancreatic duct.  One plastic biliary stent was placed in the CBD to traverse the stricture.  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  IHC stain for H. pylori was positive.   On 12/22/2023 CT chest with contrast showed no evidence of intrathoracic metastatic disease.  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   She did have consultation with Dr. Dasie.  Dr. Dasie did offer surgery as an option but because of the local extent of the disease, she will need to undergo Whipple surgery.  Patient and family members would like for her to avoid surgery as they are worried about her postoperative recovery from Whipple surgery.  They would like to proceed with concurrent chemoradiation instead.  She is scheduled to see  Dr. Dewey later today and is scheduled to begin radiation treatment from 02/13/2024.  Will proceed with concurrent chemotherapy with dose reduced capecitabine and will continue this during the course of radiation.  Caroline Snyder will be given a dose of 800 mg/m twice daily on Monday to Friday during the course of radiation.  This translated to 1000 mg p.o. twice daily dosing for her.  Prescription sent to specialty pharmacy.  Treatment aims to control disease, not cure. Side effects of chemotherapy include nausea, diarrhea, fatigue, altered taste, decreased appetite, and hand-foot syndrome. Blood counts will be monitored closely. Initial chemotherapy dose will be reduced to minimize side effects.  - Pharmacy to call her to discuss side effects and medication details. - Monitor blood counts closely and adjust chemotherapy dose as needed. - Educate her on using thick moisturizer twice daily to prevent hand-foot syndrome.  LFTs are normalized now.  CBC unremarkable.  I will plan to see her in 2 weeks for follow-up.  She was given instructions to start capecitabine on the same day that she starts radiation treatments.

## 2024-02-02 NOTE — Telephone Encounter (Signed)
 Oral Oncology Patient Advocate Encounter   Was successful in securing patient an $58,500 grant from [employer] [facility]) to provide copayment coverage for capecitabine.  This will keep the out of pocket expense at $0.     The billing information is as follows and has been shared with [facility].   Member ID: [id number] Group ID: [id number] RxBin: [id number] Dates of Eligibility: 02/02/24 through 01/30/25  Fund:  biliary tract  Estefana Sox, CPhT-Adv Oncology Pharmacy Patient Advocate [facility]  Direct Number: [phone]  Fax: [phone]

## 2024-02-02 NOTE — Telephone Encounter (Signed)
 Oral Oncology Patient Advocate Encounter  After completing a benefits investigation, prior authorization for capecitaine is not required at this time through [facility].  Patient's copay is $234.45.    Patient is eligible to use cash pricing at [facility] Cost is $0.67 per tablet.  Estefana Sox, CPhT-Adv Oncology Pharmacy Patient Advocate [facility]  Direct Number: [phone]  Fax: [phone]

## 2024-02-02 NOTE — Progress Notes (Signed)
 [facility]  ONCOLOGY CLINIC PROGRESS NOTE   Patient Care Team: [name], NP as PCP - General (Geriatric Medicine) Cleatus Collar, MD as Consulting Physician (Ophthalmology) Camillo Golas, MD as Consulting Physician (Ophthalmology) Winfred Curlee DEL, MD (Inactive) as Consulting Physician (Gynecology) Ardis Evalene CROME, RN as Oncology Nurse Navigator  PATIENT NAME: Caroline Snyder   MR#: [id number] DOB: January 16, 1941  Date of visit: 02/02/2024   ASSESSMENT & PLAN:   Caroline Snyder is a 83 y.o.  lady with a past medical history of vascular dementia, HTN, HLD, depression, migraines and GERD, was referred to our clinic for newly diagnosed adenocarcinoma of the biliary duct.   Primary cholangiocarcinoma of extrahepatic bile duct (HCC) Please review oncology history for additional details and timeline of events.    On 12/18/2023, MRCP showed moderate to severe intrahepatic bile duct dilatation.  There was an abrupt beak like narrowing of the mid common bile duct at the level of the head of the pancreas.  Findings concerning for stricture at the level of head of pancreas.    CA 19-9 was increased at 180 on 12/19/2023.  On 12/21/2023, Dr. Wilhelmenia performed ERCP.  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  Stricture was malignant appearing.  The upper third of the main bile duct, left main hepatic duct and right main hepatic duct were moderately dilated, secondary to a stricture.  Extremely difficult cannulation requiring attempt at double wire, over pancreatic stent, precut fistulotomy to eventually gain biliary access. One temporary plastic biliary stent was placed into the ventral pancreatic duct.  One plastic biliary stent was placed in the CBD to traverse the stricture.  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  IHC stain for H. pylori was positive.   On 12/22/2023 CT chest with contrast showed no evidence of intrathoracic metastatic disease.  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   She did have consultation with Dr. Dasie.  Dr. Dasie did offer surgery as an option but because of the local extent of the disease, she will need to undergo Whipple surgery.  Patient and family members would like for her to avoid surgery as they are worried about her postoperative recovery from Whipple surgery.  They would like to proceed with concurrent chemoradiation instead.  She is scheduled to see Dr. Dewey later today and is scheduled to begin radiation treatment from 02/13/2024.  Will proceed with concurrent chemotherapy with dose reduced capecitabine and will continue this during the course of radiation.  Dorothyann will be given a dose of 800 mg/m twice daily on Monday to Friday during the course of radiation.  This translated to 1000 mg p.o. twice daily dosing for her.  Prescription sent to specialty pharmacy.  Treatment aims to control disease, not cure. Side effects of chemotherapy include nausea, diarrhea, fatigue, altered taste, decreased appetite, and hand-foot syndrome. Blood counts will be monitored closely. Initial chemotherapy dose will be reduced to minimize side effects.  - Pharmacy to call her to discuss side effects and medication details. - Monitor blood counts closely and adjust chemotherapy dose as needed. - Educate her on using thick moisturizer twice daily to prevent  hand-foot syndrome.  LFTs are normalized now.  CBC unremarkable.  I will plan to see her in 2 weeks for follow-up.  She was given instructions to start capecitabine on the same day that she starts radiation  treatments.  Constipation Constipation, currently well-managed. Bowel movements were regular until recent travel. - Monitor bowel movements closely. - Use Dulcolax or Miralax if constipation recurs.    I reviewed lab results and outside records for this visit and discussed relevant results with the patient. Diagnosis, plan of care and treatment options were also discussed in detail with the patient. Opportunity provided to ask questions and answers provided to her apparent satisfaction. Provided instructions to call our clinic with any problems, questions or concerns prior to return visit. I recommended to continue follow-up with PCP and sub-specialists. She verbalized understanding and agreed with the plan.   NCCN guidelines have been consulted in the planning of this patient's care.  I spent a total of 40 minutes during this encounter with the patient including review of chart and various tests results, discussions about plan of care and coordination of care plan.   Chinita Patten, MD  02/02/2024 1:35 PM  [facility] - [facility] DEPT [facility] [address] [place] [address] Dept: [phone] Dept Fax: [phone]    CHIEF COMPLAINT/ REASON FOR VISIT:   Cholangiocarcinoma extrahepatic biliary duct  Current Treatment: Patient did not want to proceed with surgery.  Plan to begin her on concurrent chemoradiation with capecitabine.  INTERVAL HISTORY:    Discussed the use of AI scribe software for clinical note transcription with the patient, who gave verbal consent to proceed.  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  She has been diagnosed with gallbladder cancer and previously experienced severe pain that necessitated an emergency room visit. Since then, she has consulted with Dr. Dasie,  a surgeon, and Dr. Dewey, a radiation oncologist, to discuss treatment options. These options include Whipple surgery due to the local extent of the disease and a combination of radiation and chemotherapy.  She experiences some pain, which she manages with Tylenol . No issues with urination, such as burning or difficulty, are reported. She has a history of constipation, which was previously managed successfully, and she is monitoring her bowel movements closely to prevent recurrence. Her bowel movements were regular and soft during a recent trip to the beach.  She had previously elevated liver enzyme levels, which have improved following the placement of a biliary stent.    I have reviewed the past medical history, past surgical history, social history and family history with the patient and they are unchanged from previous note.  HISTORY OF PRESENT ILLNESS:   ONCOLOGY HISTORY:   83 y.o. lady with a past medical history of vascular dementia, HTN, HLD, depression, migraines and GERD, was admitted to the hospital on 12/18/2023 after she presented with intractable nausea and vomiting.  She was noted to be hypertensive and tachypneic.  Labs showed  AST/ALT 300s, and Alk Phos 315. RUQ US  with CBD dilation.  She was admitted for further workup and evaluation.   On 12/18/2023, MRCP showed moderate to severe intrahepatic bile duct dilatation.  There was an abrupt beak like narrowing of the mid common bile duct at the level of the head of the pancreas.  Findings concerning for stricture at  the level of head of pancreas.  Mild diffuse edema within the pancreatic parenchyma noted, which was concerning for pancreatitis. Mild increase caliber of the main pancreatic duct which measured up to 4 mm at the level of the pancreatic neck, which abruptly decreases in caliber at the head of pancreas. Focal area of mild increased T2 signal with restricted diffusion in the anterior head of pancreas measures 2.2 x 1.2 by 2.2  cm. Cannot exclude pancreatic head neoplasm. Consider further evaluation with endoscopic ultrasound and tissue sampling.   CA 19-9 was increased at 180 on 12/19/2023.   On 12/21/2023, Dr. Wilhelmenia performed ERCP.  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  Stricture was malignant appearing.  The upper third of the main bile duct, left main hepatic duct and right main hepatic duct were moderately dilated, secondary to a stricture.  Extremely difficult cannulation requiring attempt at double wire, over pancreatic stent, precut fistulotomy to eventually gain biliary access. One temporary plastic biliary stent was placed into the ventral pancreatic duct.  One plastic biliary stent was placed in the CBD to traverse the stricture.   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  IHC stain for H. pylori was positive.     On 12/22/2023 CT chest with contrast showed no evidence of intrathoracic metastatic disease.   Patient presented to clinic on 12/29/2023 to establish care with us .  She was in significant amount of abdominal/right chest wall pain and had to be directed to ED for further evaluation and admission.   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    She did have consultation with Dr. Dasie.  Dr. Dasie did offer surgery as an option but because of the local extent of the disease, she will need to undergo Whipple surgery.   Patient and family members would like for her to avoid surgery as they are worried about her postoperative recovery from Whipple surgery.  They would like to proceed with concurrent chemoradiation instead.   She is scheduled to see Dr.  Dewey later today and is scheduled to begin radiation treatment from 02/13/2024.  Will proceed with concurrent chemotherapy with dose reduced capecitabine and will continue this during the course of radiation.  Dorothyann will be given a dose of 800 mg/m twice daily on Monday to Friday during the course of radiation.  This translated to 1000 mg p.o. twice daily dosing for her.  Prescription sent to specialty pharmacy.   REVIEW OF SYSTEMS:   Review of Systems  Musculoskeletal:  Positive for back pain.    All other pertinent systems were reviewed with the patient and are negative.  ALLERGIES: She is allergic to aricept  [donepezil ] and vioxx [rofecoxib].  MEDICATIONS:  Current Outpatient Medications  Medication Sig Dispense Refill   aspirin  EC 81 MG tablet Take 1 tablet (81 mg total) by mouth daily. Swallow whole. 30 tablet 11   capecitabine (XELODA) 500 MG tablet Take 2 tablets by mouth twice daily on Monday to Friday, during the days of radiation. 100 tablet 0   losartan  (COZAAR ) 100 MG tablet Take 1 tablet (100 mg total) by mouth daily. Essential hypertension I10 90 tablet 3  Multiple Vitamins-Minerals (PRESERVISION AREDS 2) CAPS Take 1 capsule by mouth daily.     ondansetron  (ZOFRAN ) 4 MG tablet Take 1 tablet (4 mg total) by mouth every 8 (eight) hours as needed for nausea or vomiting. 20 tablet 0   rosuvastatin  (CRESTOR ) 20 MG tablet Take 0.5 tablets (10 mg total) by mouth daily.     acetaminophen  (TYLENOL ) 500 MG tablet Take 500 mg by mouth in the morning and at bedtime. (Patient not taking: Reported on 02/02/2024)     Current Facility-Administered Medications  Medication Dose Route Frequency Provider Last Rate Last Admin   denosumab  (PROLIA ) injection 60 mg  60 mg Subcutaneous Once Eubanks, Jessica K, NP         VITALS:   There were no vitals taken for this visit.  Wt Readings from Last 3 Encounters:  02/02/24 97 lb 4 oz (44.1 kg)  01/24/24 97 lb (44 kg)  12/29/23 95 lb 6.4 oz  (43.3 kg)    There is no height or weight on file to calculate BMI.    Onc Performance Status - 02/02/24 1152       ECOG Perf Status   ECOG Perf Status Ambulatory and capable of all selfcare but unable to carry out any work activities.  Up and about more than 50% of waking hours      KPS SCALE   KPS % SCORE Cares for self, unable to carry on normal activity or to do active work          PHYSICAL EXAM:   Physical Exam Constitutional:      General: She is not in acute distress.    Appearance: Normal appearance.  HENT:     Head: Normocephalic and atraumatic.  Cardiovascular:     Rate and Rhythm: Normal rate.     Heart sounds: Normal heart sounds.  Pulmonary:     Effort: Pulmonary effort is normal. No respiratory distress.     Breath sounds: Normal breath sounds.  Abdominal:     General: There is no distension.  Neurological:     General: No focal deficit present.     Mental Status: She is alert and oriented to person, place, and time.  Psychiatric:        Mood and Affect: Mood normal.        Behavior: Behavior normal.       LABORATORY DATA:   I have reviewed the data as listed.  Results for orders placed or performed in visit on 02/02/24  CMP (Cancer Center only)  Result Value Ref Range   Sodium 140 135 - 145 mmol/L   Potassium 4.2 3.5 - 5.1 mmol/L   Chloride 107 98 - 111 mmol/L   CO2 26 22 - 32 mmol/L   Glucose, Bld 110 (H) 70 - 99 mg/dL   BUN 20 8 - 23 mg/dL   Creatinine 9.06 9.55 - 1.00 mg/dL   Calcium  9.7 8.9 - 10.3 mg/dL   Total Protein 6.9 6.5 - 8.1 g/dL   Albumin 4.2 3.5 - 5.0 g/dL   AST 22 15 - 41 U/L   ALT 17 0 - 44 U/L   Alkaline Phosphatase 61 38 - 126 U/L   Total Bilirubin 0.4 0.0 - 1.2 mg/dL   GFR, Estimated >39 >39 mL/min   Anion gap 7 5 - 15  CBC with Differential (Cancer Center Only)  Result Value Ref Range   WBC Count 5.7 4.0 - 10.5 K/uL   RBC 3.88 3.87 - 5.11  MIL/uL   Hemoglobin 11.9 (L) 12.0 - 15.0 g/dL   HCT 65.2 (L) 63.9 - 53.9  %   MCV 89.4 80.0 - 100.0 fL   MCH 30.7 26.0 - 34.0 pg   MCHC 34.3 30.0 - 36.0 g/dL   RDW 87.1 88.4 - 84.4 %   Platelet Count 275 150 - 400 K/uL   nRBC 0.0 0.0 - 0.2 %   Neutrophils Relative % 67 %   Neutro Abs 3.8 1.7 - 7.7 K/uL   Lymphocytes Relative 22 %   Lymphs Abs 1.3 0.7 - 4.0 K/uL   Monocytes Relative 8 %   Monocytes Absolute 0.4 0.1 - 1.0 K/uL   Eosinophils Relative 2 %   Eosinophils Absolute 0.1 0.0 - 0.5 K/uL   Basophils Relative 1 %   Basophils Absolute 0.1 0.0 - 0.1 K/uL   Immature Granulocytes 0 %   Abs Immature Granulocytes 0.01 0.00 - 0.07 K/uL      RADIOGRAPHIC STUDIES:  I have personally reviewed the radiological images as listed and agree with the findings in the report.  DG Abd 2 Views Result Date: 01/12/2024 CLINICAL DATA:  Follow-up bile duct stent placement EXAM: ABDOMEN - 2 VIEW COMPARISON:  12/26/2023 FINDINGS: Biliary duct stent is again identified and stable. The pancreatic duct stent seen previously has dislodged and is not visible on these images. It is likely passed. No free air is seen. No other focal abnormality is noted. IMPRESSION: Stable biliary duct stent. Pancreatic stent has passed. Electronically Signed   By: Oneil Devonshire M.D.   On: 01/12/2024 01:25      CODE STATUS:  Code Status History     Date Active Date Inactive Code Status Order ID Comments User Context   12/18/2023 1558 12/23/2023 2029 Full Code [id number]  Georgina Basket, MD ED   09/12/2023 2330 09/13/2023 1728 Limited: Do not attempt resuscitation (DNR) -DNR-LIMITED -Do Not Intubate/DNI  [id number]  Lou Claretta HERO, MD ED   11/24/2013 1103 11/26/2013 2046 Full Code [id number]  Katrinka Garnette KIDD, MD Inpatient    Questions for Most Recent Historical Code Status (Order [id number])     Question Answer   By: Consent: discussion documented in EHR                Advance Directive Documentation    Flowsheet Row Most Recent Value  Type of Advance Directive Healthcare Power of  Attorney, Living will  Pre-existing out of facility DNR order (yellow form or pink MOST form) --  MOST Form in Place? --    No orders of the defined types were placed in this encounter.    Future Appointments  Date Time Provider Department Center  02/02/2024  2:00 PM Dewey Rush, MD [facility] None  02/10/2024  9:50 AM Armbruster, Elspeth SQUIBB, MD [facility]  02/13/2024 10:45 AM Dewey Rush, MD [facility] None  02/14/2024  4:45 PM [facility] None  02/15/2024  4:55 PM [facility] 3 [facility] None  02/16/2024  1:25 PM [facility] 3 [facility] None  02/17/2024 10:30 AM [facility] LAB [facility] None  02/17/2024 11:00 AM Makenzey Nanni, MD [facility] None  02/17/2024  4:15 PM [facility] 3 [facility] None  02/20/2024  1:00 PM [facility] LIN[facility] 4 [facility] None  02/21/2024 12:45 PM [facility] 4 [facility] None  02/22/2024 12:15 PM [facility] LIN[facility] 4 [facility] None  02/23/2024  1:00 PM [facility] 3 [facility] None  02/24/2024 11:00 AM [facility] 3 [facility] None  02/27/2024  9:30 AM [facility]  LINAC 3 [facility] None  02/28/2024  9:30 AM [facility] LIN[facility] 4 [facility] None  02/29/2024  9:30 AM [facility] LINAC 4 [facility] None  03/01/2024  9:30 AM [facility] LINAC 3 [facility] None  03/02/2024  9:45 AM [facility] LINAC 3 [facility] None  03/05/2024  9:30 AM [facility] LINAC 3 [facility] None  03/06/2024  9:30 AM [facility] LINAC 3 [facility] None  03/07/2024  9:30 AM [facility] LINAC 3 [facility] None  03/08/2024  9:30 AM [facility] LINAC 3 [facility] None  03/09/2024  9:30 AM [facility] LINAC 3 [facility] None  03/12/2024  9:30 AM [facility] LINAC 3 [facility] None  03/13/2024  9:30 AM [facility] LIN[facility] 3 [facility] None  03/14/2024  9:30 AM [facility] LINAC 3 [facility] None  03/15/2024  9:30 AM [facility] LINAC 3 [facility] None  03/16/2024  9:30 AM [facility] LINAC 3 [facility] None  03/19/2024  9:30 AM  [facility] LINAC 3 [facility] None  03/20/2024  9:30 AM [facility] LINAC 3 [facility] None  03/21/2024  9:30 AM [facility] LINAC 3 [facility] None      This document was completed utilizing speech recognition software. Grammatical errors, random word insertions, pronoun errors, and incomplete sentences are an occasional consequence of this system due to software limitations, ambient noise, and hardware issues. Any formal questions or concerns about the content, text or information contained within the body of this dictation should be directly addressed to the provider for clarification.

## 2024-02-03 LAB — CANCER ANTIGEN 19-9: CA 19-9: 138 U/mL — ABNORMAL HIGH (ref 0–35)

## 2024-02-06 ENCOUNTER — Other Ambulatory Visit: Payer: Self-pay

## 2024-02-06 ENCOUNTER — Other Ambulatory Visit (HOSPITAL_COMMUNITY): Payer: Self-pay

## 2024-02-06 MED ORDER — CAPECITABINE 500 MG PO TABS
ORAL_TABLET | ORAL | 0 refills | Status: DC
Start: 2024-02-06 — End: 2024-03-28
  Filled 2024-02-06: qty 100, 25d supply, fill #0

## 2024-02-06 NOTE — Progress Notes (Signed)
 Specialty Pharmacy Initial Fill Coordination Note  Caroline Snyder is a 83 y.o. female contacted today regarding refills of specialty medication(s) Capecitabine  (XELODA ) .  Patient requested Delivery  on 02/08/24  to verified address [address] [place] [address]   Medication will be filled on 02/07/24.   Patient is aware of $0 copayment.

## 2024-02-06 NOTE — Telephone Encounter (Signed)
 Oral Chemotherapy Pharmacist Encounter  I spoke with patient for overview of: Xeloda  for the treatment of primary cholangiocarcinoma of extrahepatic bile duct in conjunction with radiation, planned duration 5 weeks.   Counseled patient on administration, dosing, side effects, monitoring, drug-food interactions, safe handling, storage, and disposal.  Patient will take Xeloda  500mg  tablets, 2 tablets (1000mg ) by mouth in AM and 2 tabs (1000mg ) by mouth in PM, within 30 minutes of finishing meals, on days of radiation only.  Xeloda  and radiation start date: 02/13/2024  Adverse effects of Xeloda  include but are not limited to: fatigue, decreased blood counts, GI upset, diarrhea, mouth sores, and hand-foot syndrome.  Patient has anti-emetic on hand and knows to take it if nausea develops.  Patient has anti-diarrheal and will alert the office of 4 or more loose stools above baseline. Patients son will pick up udderly smooth extra care 20 and diclofenac  gel to help decrease the chances of having hand foot syndrome.   Reviewed with patient importance of keeping a medication schedule and plan for any missed doses. No barriers to medication adherence identified.  Distress thermometer completed during telephone call and reviewed with patient. Due to score, social work referral has not been sent.  Medication reconciliation performed and medication/allergy list updated. All questions answered. Patient voiced understanding and appreciation. Medication education handout placed in mail for patient. Patient knows to call the office with questions or concerns. Oral Chemotherapy Clinic phone number provided to patient.   Patient has follow-up with Dr. Autumn scheduled on 02/17/24 with labs.   Che Rachal, PharmD Hematology/Oncology Clinical Pharmacist [facility] Oral Chemotherapy Navigation [facility] [phone] 02/06/2024  10:51 AM

## 2024-02-06 NOTE — Progress Notes (Signed)
 Patient counseled in telephone encounter opened on 02/02/24. Patient does not need follow up encounter as there are no refills needed.   Kaylin Marcon, PharmD Hematology/Oncology Clinical Pharmacist [facility] [phone]

## 2024-02-09 ENCOUNTER — Telehealth: Payer: Self-pay | Admitting: Oncology

## 2024-02-09 NOTE — Telephone Encounter (Signed)
 Spoke with patient son confirming upcoming appointment change

## 2024-02-10 ENCOUNTER — Ambulatory Visit (INDEPENDENT_AMBULATORY_CARE_PROVIDER_SITE_OTHER): Payer: Medicare (Managed Care) | Admitting: Gastroenterology

## 2024-02-10 ENCOUNTER — Encounter: Payer: Self-pay | Admitting: Gastroenterology

## 2024-02-10 VITALS — BP 158/68 | HR 68 | Ht 60.0 in | Wt 97.5 lb

## 2024-02-10 DIAGNOSIS — K5900 Constipation, unspecified: Secondary | ICD-10-CM

## 2024-02-10 DIAGNOSIS — R110 Nausea: Secondary | ICD-10-CM | POA: Diagnosis not present

## 2024-02-10 DIAGNOSIS — A048 Other specified bacterial intestinal infections: Secondary | ICD-10-CM | POA: Diagnosis not present

## 2024-02-10 DIAGNOSIS — Z510 Encounter for antineoplastic radiation therapy: Secondary | ICD-10-CM | POA: Diagnosis not present

## 2024-02-10 DIAGNOSIS — B9681 Helicobacter pylori [H. pylori] as the cause of diseases classified elsewhere: Secondary | ICD-10-CM | POA: Diagnosis not present

## 2024-02-10 DIAGNOSIS — C240 Malignant neoplasm of extrahepatic bile duct: Secondary | ICD-10-CM | POA: Diagnosis not present

## 2024-02-10 NOTE — Progress Notes (Signed)
 HPI :  83 year old female known to me from remote colonoscopy, here for a follow-up visit for cholangiocarcinoma, H. pylori, constipation.  Recall she was admitted at the end of May with elevated liver enzymes and abnormal imaging in the setting of poor p.o. intake, nausea vomiting.  MRCP showed moderate to severe intrahepatic ductal dilation concerning for a stricture at the head of the pancreas, as well as dilated pancreatic duct with concern for pancreatic mass.  She underwent an EGD/EUS/ERCP with Dr. Wilhelmenia on May 21.  Biopsies of her stomach were remarkable for H. pylori for which she was treated.  EUS showed pancreatic mass, biopsies obtained, ERCP was successful with placement of a plastic stent.  Her liver enzymes normalized.  She was evaluated by surgery and oncology, ultimately declined a Whipple procedure (she has localized disease and was determined to be a surgical candidate), they have elected to proceed with radiation and chemotherapy which she is due to start next week.  She currently weighs 97 pounds but states it is actually a bit better than it has been before hand.  She is eating okay.  She denies any reflux or indigestion.  She previously had some Zofran  she was using as needed for nausea but that has not really bothered her much, taking it as needed.  She completed the course of antibiotics for H. pylori.  She states that was hard in her stomach but she was able to get through it.  She has not yet had eradication testing.  She previously has been having some constipation but taking MiraLAX and that has helped with her bowel movements, but stool form can continue to fluctuate.  We discussed how to titrate the MiraLAX  She is already scheduled with Dr. Wilhelmenia for an ERCP to remove the plastic stent and replaced with a metal stent in September.  Previous x-ray confirmed that the pancreatic stent has since fallen out.   MRCP 12/19/23: MPRESSION: 1. The gallbladder is now  decompressed. No signs of choledocholithiasis. 2. Moderate to severe intrahepatic bile duct dilatation. There is abrupt beak like narrowing of the mid common bile duct at the level of the head of pancreas. Findings are concerning for a stricture at the level of the head of pancreas. 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 3. Mild diffuse edema within the pancreatic parenchyma noted, which is concerning for pancreatitis. No signs of pancreatic necrosis or pseudocyst formation. 4. Mild increase caliber of the main pancreatic duct which measures up to 4 mm at the level of the pancreatic neck, which abruptly decreases in caliber at the head of pancreas. Focal area of mild increased T2 signal with restricted diffusion in the anterior head of pancreas measures 2.2 x 1.2 by 2.2 cm. Cannot exclude pancreatic head neoplasm. Consider further evaluation with endoscopic ultrasound and tissue sampling. 5. Aortic Atherosclerosis (ICD10-I70.0).   EGD / EUS 12/21/23: EGD Impression: - No gross lesions in the entire esophagus. Z-line regular, 40 cm from the incisors. - Gastritis with hemorrhage. Biopsied. - No gross lesions in the duodenal bulb, in the first portion of the duodenum and in the second portion of the duodenum. - Normal major papilla.   EUS Impression: - A mass was found within the middle third of the main bile duct. Cytology results are pending. However, the endosonographic appearance is highly suspicious for adenocarcinoma (extrahepatic cholangiocarcinoma). Fine needle biopsy performed. - Pancreatic parenchymal  abnormalities consisting of lobularity and hyperechoic strands were noted in the entire pancreas. No overt mass noted within the pancreas. - Main pancreatic duct (MPD) diameter was measured. Endosonographically, the MPD had a normal appearance otherwise. - No  malignant-appearing lymph nodes were visualized in the celiac region (level 20), peripancreatic region and porta hepatis region.  ERCP 12/21/23: - The major papilla appeared normal. - Extremely difficult cannulation requiring attempt at double-wire, over pancreatic stent, precut fistulotomy to eventually gain biliary access. - A single severe biliary stricture was found in the middle third of the main bile duct. The stricture was malignant appearing. - The upper third of the main bile duct, left main hepatic duct and right main hepatic duct were moderately dilated, secondary to a stricture. The stricture was brushed for cytology. - One temporary plastic biliary stent was placed into the ventral pancreatic duct to decrease PEP and aid in cannulation. - One plastic biliary stent was placed into the common bile duct to traverse the stricture.  - Repeat ERCP in 4 months to exchange stent to permanent stent pending overall clinical status.   FINAL MICROSCOPIC DIAGNOSIS:   A. 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  -  An immunohistochemical stain for Helicobacter pylori organisms is  positive.   FINAL MICROSCOPIC DIAGNOSIS:  A. COMMON BILE DUCT, NODULE FINE NEEDLE ASPIRATION:  - Malignant  - Adenocarcinoma   FINAL MICROSCOPIC DIAGNOSIS:  - Malignant cells present  - Adenocarcinoma    CT chest 12/22/23: IMPRESSION: 1. No convincing evidence for metastatic disease in the chest. 2. Several very tiny scattered bilateral pulmonary nodules, likely benign. However, given patient history, surveillance warranted. 3. Pneumobilia in the incompletely visualized liver, presumably secondary to biliary stent placement on 12/21/2023. 4.  Aortic Atherosclerosis (ICD10-I70.0).   CT abd / pelvis 12/29/23: IMPRESSION: Internal common bile duct stent in place, with resolution of diffuse biliary ductal dilatation  since previous study.   Soft tissue thickening involving the proximal common bile duct surrounding stent, consistent with known bile duct adenocarcinoma/cholangiocarcinoma.   No evidence of metastatic disease.   Mild hepatic steatosis.   Colonic diverticulosis, without radiographic evidence of diverticulitis.   Large colonic stool burden noted; recommend clinical correlation for possible constipation.   Abd x ray 01/06/24: IMPRESSION: Stable biliary duct stent.   Pancreatic stent has passed.  Past Medical History:  Diagnosis Date   Abnormal CT of the chest 11/24/2013   See CT chest 11/22/13  1. No CT evidence of pulmonary arterial embolic disease.   2. Enlarged lymph nodes in the right hilar and subcarinal region.   These may represent reactive lymph nodes, clinical correlation   recommended. There is otherwise no evidence of mediastinal or   parenchymal masses or nodules nor infiltrates.   3. Atelectasis versus scarring within the lung bases as well as   interst   Acute encephalopathy 09/13/2023   Adenocarcinoma determined by biopsy of bile duct (HCC) 2025   Allergy    Altered mental status 09/13/2023   Benign neoplasm of colon 02/11/2012   Bunion 07/01/2010   Closed nondisplaced fracture of distal phalanx of right thumb 2020-08-05   Death of child 14-Sep-2016   Adult son truck driver died 87/86/82 in accident.     Depression 05/05/2007   External hemorrhoids without mention of complication 03/24/1999   GERD (gastroesophageal reflux disease) 08/02/1998   Hyperlipidemia 06/29/2006   Hypertensive emergency 09/13/2023   Memory  loss 04/15/2003   Migraine without aura, without mention of intractable migraine without mention of status migrainosus 03/24/1999   Osteoporosis, unspecified 04/15/2004   Tear film insufficiency, unspecified 03/24/1999   Unspecified essential hypertension 11/22/2007     Past Surgical History:  Procedure Laterality Date   COLONOSCOPY  06-16-2007    Internal hemorrhoids,laxity of anal sphincter,polyp at 25cm form anal verge, tortuous sigmoid colon. Dr.Orr    ERCP N/A 12/21/2023   Procedure: ERCP, WITH INTERVENTION IF INDICATED;  Surgeon: Wilhelmenia Aloha Raddle., MD;  Location: [facility] ENDOSCOPY;  Service: Gastroenterology;  Laterality: N/A;   ESOPHAGOGASTRODUODENOSCOPY N/A 12/21/2023   Procedure: EGD (ESOPHAGOGASTRODUODENOSCOPY);  Surgeon: Wilhelmenia Aloha Raddle., MD;  Location[facility] ENDOSCOPY;  Service: Gastroenterology;  Laterality: N/A;   EUS N/A 12/21/2023   Procedure: ULTRASOUND, UPPER GI TRACT, ENDOSCOPIC;  Surgeon: Wilhelmenia Aloha Raddle., MD;  Location: [facility] ENDOSCOPY;  Service: Gastroenterology;  Laterality: N/A;   EYE SURGERY Bilateral 2010   cataract Dr. Camillo   FINE NEEDLE ASPIRATION  12/21/2023   Procedure: FINE NEEDLE ASPIRATION;  Surgeon: Wilhelmenia Aloha Raddle., MD;  Location: [facility] ENDOSCOPY;  Service: Gastroenterology;;   VAGINAL HYSTERECTOMY  1980   Family History  Problem Relation Age of Onset   Alzheimer's disease Mother    Diabetes Mother    Transient ischemic attack Mother    Heart disease Mother        MI, CHF   Alcohol  abuse Father    Kidney disease Father    Hypertension Sister    Hypertension Sister    Hypertension Brother    Heart disease Brother    Hypertension Brother    Hypertension Brother    Other Son        truck accident 07/2016   Colon cancer Neg Hx    Stomach cancer Neg Hx    Esophageal cancer Neg Hx    Pancreatic cancer Neg Hx    Social History   Tobacco Use   Smoking status: Never   Smokeless tobacco: Never  Vaping Use   Vaping status: Never Used  Substance Use Topics   Alcohol  use: No    Alcohol /week: 0.0 standard drinks of alcohol    Drug use: No   Current Outpatient Medications  Medication Sig Dispense Refill   acetaminophen  (TYLENOL ) 500 MG tablet Take 500 mg by mouth in the morning and at bedtime.     aspirin  EC 81 MG tablet Take 1 tablet (81 mg total) by mouth daily. Swallow whole. 30  tablet 11   capecitabine  (XELODA ) 500 MG tablet Take 2 tablets (1000mg ) by mouth twice daily within 30 mins of a meal. Take on Monday through Friday, during the days of radiation. 100 tablet 0   losartan  (COZAAR ) 100 MG tablet Take 1 tablet (100 mg total) by mouth daily. Essential hypertension I10 90 tablet 3   Multiple Vitamins-Minerals (PRESERVISION AREDS 2) CAPS Take 1 capsule by mouth daily.     ondansetron  (ZOFRAN ) 4 MG tablet Take 1 tablet (4 mg total) by mouth every 8 (eight) hours as needed for nausea or vomiting. 20 tablet 0   rosuvastatin  (CRESTOR ) 20 MG tablet Take 0.5 tablets (10 mg total) by mouth daily.     Current Facility-Administered Medications  Medication Dose Route Frequency Provider Last Rate Last Admin   denosumab  (PROLIA ) injection 60 mg  60 mg Subcutaneous Once Eubanks, Jessica K, NP       Allergies  Allergen Reactions   Aricept  [Donepezil ] Diarrhea   Vioxx [Rofecoxib] Nausea Only    Pt.  Can't remember what reaction was but asks to leave on her profile.      Review of Systems: All systems reviewed and negative except where noted in HPI.   Lab Results  Component Value Date   WBC 5.7 02/02/2024   HGB 11.9 (L) 02/02/2024   HCT 34.7 (L) 02/02/2024   MCV 89.4 02/02/2024   PLT 275 02/02/2024    Lab Results  Component Value Date   ALT 17 02/02/2024   AST 22 02/02/2024   ALKPHOS 61 02/02/2024   BILITOT 0.4 02/02/2024    Lab Results  Component Value Date   NA 140 02/02/2024   CL 107 02/02/2024   K 4.2 02/02/2024   CO2 26 02/02/2024   BUN 20 02/02/2024   CREATININE 0.93 02/02/2024   GFRNONAA >60 02/02/2024   CALCIUM  9.7 02/02/2024   PHOS 2.7 09/13/2023   ALBUMIN 4.2 02/02/2024   GLUCOSE 110 (H) 02/02/2024     Physical Exam: BP (!) 158/68   Pulse 68   Ht 5' (1.524 m)   Wt 97 lb 8 oz (44.2 kg)   SpO2 96%   BMI 19.04 kg/m  Constitutional: Pleasant,well-developed, female in no acute distress. Neurological: Alert and oriented to person place  and time. Psychiatric: Normal mood and affect. Behavior is normal.   ASSESSMENT: 83 y.o. female here for assessment of the following  1. Primary cholangiocarcinoma of extrahepatic bile duct (HCC)   2. H. pylori infection   3. Nausea without vomiting   4. Constipation, unspecified constipation type    Localized cholangiocarcinoma, deemed to be a surgical candidate but has declined surgery and proceeding with XRT and chemotherapy next week.  Refilled her Zofran  to use as needed for nausea associated with chemotherapy.  Otherwise her weight has been stable and LFTs have normalized with stenting.  The pancreatic stent has since migrated appropriately.  She is scheduled with Dr. Wilhelmenia for ERCP with stent exchange in September, she will keep that appointment.  Otherwise continue with plan for XRT and chemotherapy next week.  She is otherwise stable and feels well at this time.  Weight stable.  We reviewed her history of H. pylori, received appropriate therapy for this.  We discussed recommendations for stool testing for eradication.  Hopefully this was successful in treatment.  Recommend H. pylori Diatherix stool test to confirm eradication.  If this is positive and she has failed to eradicate, then we will discuss timing of when we would retreat, assuming her stomach may not tolerate another round of antibiotics on chemotherapy and it may be tough for her to complete this in the near future.  Will await the result, hopefully initial therapy was successful.  Otherwise using MiraLAX, I discussed with them how to titrate up or down as needed based on her bowel movements.  Sounds like she needs to take this daily for her bowel habits and she is agreeable.   PLAN: - chemotherapy and XRT to start next week for her malignancy - scheduled for ERCP with stent change - Dr. Wilhelmenia, Sept 15th. LFTs normalized and oncology to trend - diatherix stool H pylori antigen test for eradication testing -  refilled Zofran  to use PRN for nausea  - continue Miralax PRN - titrate as needed  Marcey Naval, MD [facility] Gastroenterology

## 2024-02-10 NOTE — Patient Instructions (Addendum)
 Your provider has ordered Diatherix stool testing for you. You have received a kit from our office today containing all necessary supplies to complete this test. Please carefully read the stool collection instructions provided in the kit before opening the accompanying materials. In addition, be sure there is a label providing your full name and date of birth on the puritan opti-swab tube that is supplied in the kit (if you do not see a label with this information on your test tube, please make us  aware before test collection!). After completing the test, you should secure the purtian tube into the specimen biohazard bag. The [facility] E-Req sheet (including date and time of specimen collection) should be placed into the outside pocket of the specimen biohazard bag and returned to the [place] lab (basement floor of [facility]) within 3 days of collection. Please make sure to give the specimen to a staff member at the lab. DO NOT leave the specimen on the counter.   If the specimen date and time (can be found in the upper right boxed portion of the sheet) are not filled out on the E-Req sheet, the test will NOT be performed.    We have sent the following medications to your pharmacy for you to pick up at your convenience: Zofran   Continue Miralax - titrate as needed  Thank you for entrusting me with your care and for choosing [facility], Dr. Elspeth Naval    If your blood pressure at your visit was 140/90 or greater, please contact your primary care physician to follow up on this. ______________________________________________________  If you are age 18 or older, your body mass index should be between 23-30. Your Body mass index is 19.04 kg/m. If this is out of the aforementioned range listed, please consider follow up with your Primary Care Provider.  If you are age 51 or younger, your body mass index should be between 19-25. Your Body mass index  is 19.04 kg/m. If this is out of the aformentioned range listed, please consider follow up with your Primary Care Provider.  ________________________________________________________  The [facility] providers would like to encourage you to use MYCHART to communicate with providers for non-urgent requests or questions.  Due to long hold times on the telephone, sending your provider a message by [facility] may be a faster and more efficient way to get a response.  Please allow 48 business hours for a response.  Please remember that this is for non-urgent requests.  _______________________________________________________  Due to recent changes in healthcare laws, you may see the results of your imaging and laboratory studies on MyChart before your provider has had a chance to review them.  We understand that in some cases there may be results that are confusing or concerning to you. Not all laboratory results come back in the same time frame and the provider may be waiting for multiple results in order to interpret others.  Please give us  48 hours in order for your provider to thoroughly review all the results before contacting the office for clarification of your results.

## 2024-02-13 ENCOUNTER — Other Ambulatory Visit: Payer: Self-pay

## 2024-02-13 ENCOUNTER — Ambulatory Visit
Admission: RE | Admit: 2024-02-13 | Discharge: 2024-02-13 | Disposition: A | Discharge status: Home / Self Care | Payer: Medicare (Managed Care) | Source: Ambulatory Visit | Attending: Radiation Oncology | Admitting: Radiation Oncology

## 2024-02-13 DIAGNOSIS — Z510 Encounter for antineoplastic radiation therapy: Secondary | ICD-10-CM | POA: Diagnosis not present

## 2024-02-13 LAB — RAD ONC ARIA SESSION SUMMARY
Course Elapsed Days: 0
Plan Fractions Treated to Date: 1
Plan Prescribed Dose Per Fraction: 1.8 Gy
Plan Total Fractions Prescribed: 25
Plan Total Prescribed Dose: 45 Gy
Reference Point Dosage Given to Date: 1.8 Gy
Reference Point Session Dosage Given: 1.8 Gy
Session Number: 1

## 2024-02-14 ENCOUNTER — Other Ambulatory Visit: Payer: Self-pay

## 2024-02-14 ENCOUNTER — Ambulatory Visit
Admission: RE | Admit: 2024-02-14 | Discharge: 2024-02-14 | Disposition: A | Discharge status: Home / Self Care | Payer: Medicare (Managed Care) | Source: Ambulatory Visit | Attending: Radiation Oncology | Admitting: Radiation Oncology

## 2024-02-14 DIAGNOSIS — C240 Malignant neoplasm of extrahepatic bile duct: Secondary | ICD-10-CM | POA: Diagnosis not present

## 2024-02-14 DIAGNOSIS — Z510 Encounter for antineoplastic radiation therapy: Secondary | ICD-10-CM | POA: Diagnosis not present

## 2024-02-14 LAB — RAD ONC ARIA SESSION SUMMARY
Course Elapsed Days: 1
Plan Fractions Treated to Date: 2
Plan Prescribed Dose Per Fraction: 1.8 Gy
Plan Total Fractions Prescribed: 25
Plan Total Prescribed Dose: 45 Gy
Reference Point Dosage Given to Date: 3.6 Gy
Reference Point Session Dosage Given: 1.8 Gy
Session Number: 2

## 2024-02-15 ENCOUNTER — Ambulatory Visit
Admission: RE | Admit: 2024-02-15 | Discharge: 2024-02-15 | Disposition: A | Discharge status: Home / Self Care | Payer: Medicare (Managed Care) | Source: Ambulatory Visit | Attending: Radiation Oncology | Admitting: Radiation Oncology

## 2024-02-15 ENCOUNTER — Other Ambulatory Visit: Payer: Self-pay

## 2024-02-15 DIAGNOSIS — C240 Malignant neoplasm of extrahepatic bile duct: Secondary | ICD-10-CM | POA: Diagnosis not present

## 2024-02-15 DIAGNOSIS — Z510 Encounter for antineoplastic radiation therapy: Secondary | ICD-10-CM | POA: Diagnosis not present

## 2024-02-15 LAB — RAD ONC ARIA SESSION SUMMARY
Course Elapsed Days: 2
Plan Fractions Treated to Date: 3
Plan Prescribed Dose Per Fraction: 1.8 Gy
Plan Total Fractions Prescribed: 25
Plan Total Prescribed Dose: 45 Gy
Reference Point Dosage Given to Date: 5.4 Gy
Reference Point Session Dosage Given: 1.8 Gy
Session Number: 3

## 2024-02-16 ENCOUNTER — Inpatient Hospital Stay: Payer: Medicare (Managed Care) | Admitting: Oncology

## 2024-02-16 ENCOUNTER — Other Ambulatory Visit: Payer: Self-pay | Admitting: Oncology

## 2024-02-16 ENCOUNTER — Inpatient Hospital Stay: Payer: Medicare (Managed Care)

## 2024-02-16 ENCOUNTER — Ambulatory Visit
Admission: RE | Admit: 2024-02-16 | Discharge: 2024-02-16 | Disposition: A | Discharge status: Home / Self Care | Payer: Medicare (Managed Care) | Source: Ambulatory Visit | Attending: Radiation Oncology | Admitting: Radiation Oncology

## 2024-02-16 ENCOUNTER — Other Ambulatory Visit: Payer: Self-pay

## 2024-02-16 DIAGNOSIS — C240 Malignant neoplasm of extrahepatic bile duct: Secondary | ICD-10-CM

## 2024-02-16 DIAGNOSIS — Z510 Encounter for antineoplastic radiation therapy: Secondary | ICD-10-CM | POA: Diagnosis not present

## 2024-02-16 LAB — RAD ONC ARIA SESSION SUMMARY
Course Elapsed Days: 3
Plan Fractions Treated to Date: 4
Plan Prescribed Dose Per Fraction: 1.8 Gy
Plan Total Fractions Prescribed: 25
Plan Total Prescribed Dose: 45 Gy
Reference Point Dosage Given to Date: 7.2 Gy
Reference Point Session Dosage Given: 1.8 Gy
Session Number: 4

## 2024-02-16 MED ORDER — SONAFINE EX EMUL
1.0000 | Freq: Once | CUTANEOUS | Status: AC
  Administered 2024-02-16: 1 via TOPICAL

## 2024-02-17 ENCOUNTER — Inpatient Hospital Stay: Payer: Medicare (Managed Care)

## 2024-02-17 ENCOUNTER — Ambulatory Visit
Admission: RE | Admit: 2024-02-17 | Discharge: 2024-02-17 | Disposition: A | Discharge status: Home / Self Care | Payer: Medicare (Managed Care) | Source: Ambulatory Visit | Attending: Radiation Oncology | Admitting: Radiation Oncology

## 2024-02-17 ENCOUNTER — Ambulatory Visit: Payer: Medicare (Managed Care)

## 2024-02-17 ENCOUNTER — Inpatient Hospital Stay: Payer: Medicare (Managed Care) | Admitting: Oncology

## 2024-02-17 ENCOUNTER — Other Ambulatory Visit: Payer: Self-pay

## 2024-02-17 DIAGNOSIS — Z510 Encounter for antineoplastic radiation therapy: Secondary | ICD-10-CM | POA: Diagnosis not present

## 2024-02-17 DIAGNOSIS — C240 Malignant neoplasm of extrahepatic bile duct: Secondary | ICD-10-CM | POA: Diagnosis not present

## 2024-02-17 LAB — RAD ONC ARIA SESSION SUMMARY
Course Elapsed Days: 4
Plan Fractions Treated to Date: 5
Plan Prescribed Dose Per Fraction: 1.8 Gy
Plan Total Fractions Prescribed: 25
Plan Total Prescribed Dose: 45 Gy
Reference Point Dosage Given to Date: 9 Gy
Reference Point Session Dosage Given: 1.8 Gy
Session Number: 5

## 2024-02-20 ENCOUNTER — Other Ambulatory Visit: Payer: Self-pay

## 2024-02-20 ENCOUNTER — Ambulatory Visit
Admission: RE | Admit: 2024-02-20 | Discharge: 2024-02-20 | Disposition: A | Discharge status: Home / Self Care | Payer: Medicare (Managed Care) | Source: Ambulatory Visit | Attending: Radiation Oncology | Admitting: Radiation Oncology

## 2024-02-20 DIAGNOSIS — Z510 Encounter for antineoplastic radiation therapy: Secondary | ICD-10-CM | POA: Diagnosis not present

## 2024-02-20 LAB — RAD ONC ARIA SESSION SUMMARY
Course Elapsed Days: 7
Plan Fractions Treated to Date: 6
Plan Prescribed Dose Per Fraction: 1.8 Gy
Plan Total Fractions Prescribed: 25
Plan Total Prescribed Dose: 45 Gy
Reference Point Dosage Given to Date: 10.8 Gy
Reference Point Session Dosage Given: 1.8 Gy
Session Number: 6

## 2024-02-21 ENCOUNTER — Ambulatory Visit
Admission: RE | Admit: 2024-02-21 | Discharge: 2024-02-21 | Disposition: A | Discharge status: Home / Self Care | Payer: Medicare (Managed Care) | Source: Ambulatory Visit | Attending: Radiation Oncology | Admitting: Radiation Oncology

## 2024-02-21 ENCOUNTER — Other Ambulatory Visit: Payer: Self-pay

## 2024-02-21 ENCOUNTER — Telehealth: Payer: Self-pay | Admitting: *Deleted

## 2024-02-21 DIAGNOSIS — Z510 Encounter for antineoplastic radiation therapy: Secondary | ICD-10-CM | POA: Diagnosis not present

## 2024-02-21 DIAGNOSIS — C240 Malignant neoplasm of extrahepatic bile duct: Secondary | ICD-10-CM | POA: Diagnosis not present

## 2024-02-21 LAB — RAD ONC ARIA SESSION SUMMARY
Course Elapsed Days: 8
Plan Fractions Treated to Date: 7
Plan Prescribed Dose Per Fraction: 1.8 Gy
Plan Total Fractions Prescribed: 25
Plan Total Prescribed Dose: 45 Gy
Reference Point Dosage Given to Date: 12.6 Gy
Reference Point Session Dosage Given: 1.8 Gy
Session Number: 7

## 2024-02-21 NOTE — Telephone Encounter (Signed)
 Verification submitted for upcoming Prolia . Holding until after Treatment. Harlene An, NP Aware and is requesting patient to schedule an appointment to follow up before scheduling injection due to Last injection was 04/08/2023.   In Reviewing patient's Chart, Prolia  is being HELD due to Radiation and Xeloda   Per Carolyn Hum, [facility] 02/06/24 Current medication list in Epic reviewed, DDIs with Xeloda  identified: - denosumab : patient received for osteoporosis. Patient receives every 6 months and last dose was in Feb 2025. Will notify patient that she Roshard Rezabek need to delay next dose if she is still receiving xeloda / radiation at that time. Prolia  Jhonathan Desroches increase the immunosuppressant effects of xeloda .

## 2024-02-22 ENCOUNTER — Ambulatory Visit
Admission: RE | Admit: 2024-02-22 | Discharge: 2024-02-22 | Disposition: A | Discharge status: Home / Self Care | Payer: Medicare (Managed Care) | Source: Ambulatory Visit | Attending: Radiation Oncology

## 2024-02-22 ENCOUNTER — Other Ambulatory Visit: Payer: Self-pay

## 2024-02-22 DIAGNOSIS — C240 Malignant neoplasm of extrahepatic bile duct: Secondary | ICD-10-CM | POA: Diagnosis not present

## 2024-02-22 DIAGNOSIS — Z510 Encounter for antineoplastic radiation therapy: Secondary | ICD-10-CM | POA: Diagnosis not present

## 2024-02-22 LAB — RAD ONC ARIA SESSION SUMMARY
Course Elapsed Days: 9
Plan Fractions Treated to Date: 8
Plan Prescribed Dose Per Fraction: 1.8 Gy
Plan Total Fractions Prescribed: 25
Plan Total Prescribed Dose: 45 Gy
Reference Point Dosage Given to Date: 14.4 Gy
Reference Point Session Dosage Given: 1.8 Gy
Session Number: 8

## 2024-02-23 ENCOUNTER — Ambulatory Visit
Admission: RE | Admit: 2024-02-23 | Discharge: 2024-02-23 | Disposition: A | Discharge status: Home / Self Care | Payer: Medicare (Managed Care) | Source: Ambulatory Visit | Attending: Radiation Oncology

## 2024-02-23 ENCOUNTER — Other Ambulatory Visit: Payer: Self-pay

## 2024-02-23 DIAGNOSIS — C240 Malignant neoplasm of extrahepatic bile duct: Secondary | ICD-10-CM | POA: Diagnosis not present

## 2024-02-23 DIAGNOSIS — Z510 Encounter for antineoplastic radiation therapy: Secondary | ICD-10-CM | POA: Diagnosis not present

## 2024-02-23 LAB — RAD ONC ARIA SESSION SUMMARY
Course Elapsed Days: 10
Plan Fractions Treated to Date: 9
Plan Prescribed Dose Per Fraction: 1.8 Gy
Plan Total Fractions Prescribed: 25
Plan Total Prescribed Dose: 45 Gy
Reference Point Dosage Given to Date: 16.2 Gy
Reference Point Session Dosage Given: 1.8 Gy
Session Number: 9

## 2024-02-24 ENCOUNTER — Ambulatory Visit
Admission: RE | Admit: 2024-02-24 | Discharge: 2024-02-24 | Disposition: A | Discharge status: Home / Self Care | Payer: Medicare (Managed Care) | Source: Ambulatory Visit | Attending: Radiation Oncology | Admitting: Radiation Oncology

## 2024-02-24 ENCOUNTER — Inpatient Hospital Stay: Payer: Medicare (Managed Care)

## 2024-02-24 ENCOUNTER — Encounter: Payer: Self-pay | Admitting: Gastroenterology

## 2024-02-24 ENCOUNTER — Other Ambulatory Visit: Payer: Self-pay

## 2024-02-24 DIAGNOSIS — Z510 Encounter for antineoplastic radiation therapy: Secondary | ICD-10-CM | POA: Diagnosis not present

## 2024-02-24 DIAGNOSIS — C240 Malignant neoplasm of extrahepatic bile duct: Secondary | ICD-10-CM | POA: Diagnosis not present

## 2024-02-24 DIAGNOSIS — C221 Intrahepatic bile duct carcinoma: Secondary | ICD-10-CM | POA: Diagnosis not present

## 2024-02-24 LAB — CMP (CANCER CENTER ONLY)
ALT: 11 U/L (ref 0–44)
AST: 19 U/L (ref 15–41)
Albumin: 4.1 g/dL (ref 3.5–5.0)
Alkaline Phosphatase: 48 U/L (ref 38–126)
Anion gap: 6 (ref 5–15)
BUN: 16 mg/dL (ref 8–23)
CO2: 27 mmol/L (ref 22–32)
Calcium: 9.3 mg/dL (ref 8.9–10.3)
Chloride: 104 mmol/L (ref 98–111)
Creatinine: 0.91 mg/dL (ref 0.44–1.00)
GFR, Estimated: >60 mL/min (ref >60)
Glucose, Bld: 131 mg/dL — ABNORMAL HIGH (ref 70–99)
Potassium: 3.8 mmol/L (ref 3.5–5.1)
Sodium: 137 mmol/L (ref 135–145)
Total Bilirubin: 0.3 mg/dL (ref 0.0–1.2)
Total Protein: 6.5 g/dL (ref 6.5–8.1)

## 2024-02-24 LAB — CBC WITH DIFFERENTIAL (CANCER CENTER ONLY)
Abs Immature Granulocytes: 0.01 K/uL (ref 0.00–0.07)
Basophils Absolute: 0 K/uL (ref 0.0–0.1)
Basophils Relative: 1 %
Eosinophils Absolute: 0.1 K/uL (ref 0.0–0.5)
Eosinophils Relative: 3 %
HCT: 31.5 % — ABNORMAL LOW (ref 36.0–46.0)
Hemoglobin: 11 g/dL — ABNORMAL LOW (ref 12.0–15.0)
Immature Granulocytes: 0 %
Lymphocytes Relative: 18 %
Lymphs Abs: 0.6 K/uL — ABNORMAL LOW (ref 0.7–4.0)
MCH: 31.3 pg (ref 26.0–34.0)
MCHC: 34.9 g/dL (ref 30.0–36.0)
MCV: 89.7 fL (ref 80.0–100.0)
Monocytes Absolute: 0.3 K/uL (ref 0.1–1.0)
Monocytes Relative: 8 %
Neutro Abs: 2.3 K/uL (ref 1.7–7.7)
Neutrophils Relative %: 70 %
Platelet Count: 228 K/uL (ref 150–400)
RBC: 3.51 MIL/uL — ABNORMAL LOW (ref 3.87–5.11)
RDW: 12 % (ref 11.5–15.5)
WBC Count: 3.2 K/uL — ABNORMAL LOW (ref 4.0–10.5)
nRBC: 0 % (ref 0.0–0.2)

## 2024-02-24 LAB — RAD ONC ARIA SESSION SUMMARY
Course Elapsed Days: 11
Plan Fractions Treated to Date: 10
Plan Prescribed Dose Per Fraction: 1.8 Gy
Plan Total Fractions Prescribed: 25
Plan Total Prescribed Dose: 45 Gy
Reference Point Dosage Given to Date: 18 Gy
Reference Point Session Dosage Given: 1.8 Gy
Session Number: 10

## 2024-02-24 LAB — MAGNESIUM: Magnesium: 2 mg/dL (ref 1.7–2.4)

## 2024-02-27 ENCOUNTER — Other Ambulatory Visit: Payer: Self-pay

## 2024-02-27 ENCOUNTER — Ambulatory Visit
Admission: RE | Admit: 2024-02-27 | Discharge: 2024-02-27 | Disposition: A | Discharge status: Home / Self Care | Payer: Medicare (Managed Care) | Source: Ambulatory Visit | Attending: Radiation Oncology | Admitting: Radiation Oncology

## 2024-02-27 ENCOUNTER — Telehealth: Payer: Self-pay

## 2024-02-27 ENCOUNTER — Other Ambulatory Visit: Payer: Self-pay | Admitting: Family

## 2024-02-27 DIAGNOSIS — R110 Nausea: Secondary | ICD-10-CM

## 2024-02-27 DIAGNOSIS — Z510 Encounter for antineoplastic radiation therapy: Secondary | ICD-10-CM | POA: Diagnosis not present

## 2024-02-27 LAB — RAD ONC ARIA SESSION SUMMARY
Course Elapsed Days: 14
Plan Fractions Treated to Date: 11
Plan Prescribed Dose Per Fraction: 1.8 Gy
Plan Total Fractions Prescribed: 25
Plan Total Prescribed Dose: 45 Gy
Reference Point Dosage Given to Date: 19.8 Gy
Reference Point Session Dosage Given: 1.8 Gy
Session Number: 11

## 2024-02-27 NOTE — Telephone Encounter (Signed)
 Oncology Pharmacist Encounter  I reviewed patients labs from 02/24/24 while MD is out of town.   ANC: 2.3 WBC: 3.2 Scr: 0.91  Creatinine has not changed since patient has been on therapy with Xeloda . Patient can continue current dosage.   Wynne Rozak, PharmD Hematology/Oncology Clinical Pharmacist [facility] [phone]

## 2024-02-28 ENCOUNTER — Other Ambulatory Visit: Payer: Self-pay

## 2024-02-28 ENCOUNTER — Ambulatory Visit
Admission: RE | Admit: 2024-02-28 | Discharge: 2024-02-28 | Disposition: A | Discharge status: Home / Self Care | Payer: Medicare (Managed Care) | Source: Ambulatory Visit | Attending: Radiation Oncology

## 2024-02-28 DIAGNOSIS — C240 Malignant neoplasm of extrahepatic bile duct: Secondary | ICD-10-CM | POA: Diagnosis not present

## 2024-02-28 DIAGNOSIS — Z510 Encounter for antineoplastic radiation therapy: Secondary | ICD-10-CM | POA: Diagnosis not present

## 2024-02-28 LAB — RAD ONC ARIA SESSION SUMMARY
Course Elapsed Days: 15
Plan Fractions Treated to Date: 12
Plan Prescribed Dose Per Fraction: 1.8 Gy
Plan Total Fractions Prescribed: 25
Plan Total Prescribed Dose: 45 Gy
Reference Point Dosage Given to Date: 21.6 Gy
Reference Point Session Dosage Given: 1.8 Gy
Session Number: 12

## 2024-02-29 ENCOUNTER — Ambulatory Visit
Admission: RE | Admit: 2024-02-29 | Discharge: 2024-02-29 | Disposition: A | Discharge status: Home / Self Care | Payer: Medicare (Managed Care) | Source: Ambulatory Visit | Attending: Radiation Oncology

## 2024-02-29 ENCOUNTER — Other Ambulatory Visit: Payer: Self-pay

## 2024-02-29 DIAGNOSIS — C240 Malignant neoplasm of extrahepatic bile duct: Secondary | ICD-10-CM | POA: Diagnosis not present

## 2024-02-29 DIAGNOSIS — Z510 Encounter for antineoplastic radiation therapy: Secondary | ICD-10-CM | POA: Diagnosis not present

## 2024-02-29 LAB — RAD ONC ARIA SESSION SUMMARY
Course Elapsed Days: 16
Plan Fractions Treated to Date: 13
Plan Prescribed Dose Per Fraction: 1.8 Gy
Plan Total Fractions Prescribed: 25
Plan Total Prescribed Dose: 45 Gy
Reference Point Dosage Given to Date: 23.4 Gy
Reference Point Session Dosage Given: 1.8 Gy
Session Number: 13

## 2024-03-01 ENCOUNTER — Other Ambulatory Visit: Payer: Self-pay

## 2024-03-01 ENCOUNTER — Ambulatory Visit
Admission: RE | Admit: 2024-03-01 | Discharge: 2024-03-01 | Disposition: A | Discharge status: Home / Self Care | Payer: Medicare (Managed Care) | Source: Ambulatory Visit | Attending: Radiation Oncology | Admitting: Radiation Oncology

## 2024-03-01 DIAGNOSIS — Z510 Encounter for antineoplastic radiation therapy: Secondary | ICD-10-CM | POA: Diagnosis not present

## 2024-03-01 DIAGNOSIS — C240 Malignant neoplasm of extrahepatic bile duct: Secondary | ICD-10-CM | POA: Diagnosis not present

## 2024-03-01 LAB — RAD ONC ARIA SESSION SUMMARY
Course Elapsed Days: 17
Plan Fractions Treated to Date: 14
Plan Prescribed Dose Per Fraction: 1.8 Gy
Plan Total Fractions Prescribed: 25
Plan Total Prescribed Dose: 45 Gy
Reference Point Dosage Given to Date: 25.2 Gy
Reference Point Session Dosage Given: 1.8 Gy
Session Number: 14

## 2024-03-02 ENCOUNTER — Other Ambulatory Visit: Payer: Self-pay

## 2024-03-02 ENCOUNTER — Inpatient Hospital Stay: Payer: Medicare (Managed Care) | Attending: Oncology

## 2024-03-02 ENCOUNTER — Telehealth: Payer: Self-pay | Admitting: Pharmacist

## 2024-03-02 ENCOUNTER — Ambulatory Visit
Admission: RE | Admit: 2024-03-02 | Discharge: 2024-03-02 | Disposition: A | Discharge status: Home / Self Care | Payer: Medicare (Managed Care) | Source: Ambulatory Visit | Attending: Radiation Oncology | Admitting: Radiation Oncology

## 2024-03-02 DIAGNOSIS — C240 Malignant neoplasm of extrahepatic bile duct: Secondary | ICD-10-CM

## 2024-03-02 DIAGNOSIS — C221 Intrahepatic bile duct carcinoma: Secondary | ICD-10-CM | POA: Diagnosis not present

## 2024-03-02 DIAGNOSIS — C248 Malignant neoplasm of overlapping sites of biliary tract: Secondary | ICD-10-CM | POA: Diagnosis not present

## 2024-03-02 DIAGNOSIS — Z510 Encounter for antineoplastic radiation therapy: Secondary | ICD-10-CM | POA: Diagnosis not present

## 2024-03-02 LAB — CBC WITH DIFFERENTIAL (CANCER CENTER ONLY)
Abs Immature Granulocytes: 0.01 K/uL (ref 0.00–0.07)
Basophils Absolute: 0 K/uL (ref 0.0–0.1)
Basophils Relative: 1 %
Eosinophils Absolute: 0 K/uL (ref 0.0–0.5)
Eosinophils Relative: 2 %
HCT: 32.4 % — ABNORMAL LOW (ref 36.0–46.0)
Hemoglobin: 11.3 g/dL — ABNORMAL LOW (ref 12.0–15.0)
Immature Granulocytes: 0 %
Lymphocytes Relative: 14 %
Lymphs Abs: 0.4 K/uL — ABNORMAL LOW (ref 0.7–4.0)
MCH: 31.3 pg (ref 26.0–34.0)
MCHC: 34.9 g/dL (ref 30.0–36.0)
MCV: 89.8 fL (ref 80.0–100.0)
Monocytes Absolute: 0.2 K/uL (ref 0.1–1.0)
Monocytes Relative: 9 %
Neutro Abs: 2 K/uL (ref 1.7–7.7)
Neutrophils Relative %: 74 %
Platelet Count: 185 K/uL (ref 150–400)
RBC: 3.61 MIL/uL — ABNORMAL LOW (ref 3.87–5.11)
RDW: 12.3 % (ref 11.5–15.5)
WBC Count: 2.7 K/uL — ABNORMAL LOW (ref 4.0–10.5)
nRBC: 0 % (ref 0.0–0.2)

## 2024-03-02 LAB — CMP (CANCER CENTER ONLY)
ALT: 10 U/L (ref 0–44)
AST: 19 U/L (ref 15–41)
Albumin: 4.4 g/dL (ref 3.5–5.0)
Alkaline Phosphatase: 39 U/L (ref 38–126)
Anion gap: 6 (ref 5–15)
BUN: 15 mg/dL (ref 8–23)
CO2: 31 mmol/L (ref 22–32)
Calcium: 9.5 mg/dL (ref 8.9–10.3)
Chloride: 102 mmol/L (ref 98–111)
Creatinine: 0.94 mg/dL (ref 0.44–1.00)
GFR, Estimated: 60 mL/min — ABNORMAL LOW (ref >60)
Glucose, Bld: 96 mg/dL (ref 70–99)
Potassium: 3.8 mmol/L (ref 3.5–5.1)
Sodium: 139 mmol/L (ref 135–145)
Total Bilirubin: 0.4 mg/dL (ref 0.0–1.2)
Total Protein: 6.8 g/dL (ref 6.5–8.1)

## 2024-03-02 LAB — RAD ONC ARIA SESSION SUMMARY
Course Elapsed Days: 18
Plan Fractions Treated to Date: 15
Plan Prescribed Dose Per Fraction: 1.8 Gy
Plan Total Fractions Prescribed: 25
Plan Total Prescribed Dose: 45 Gy
Reference Point Dosage Given to Date: 27 Gy
Reference Point Session Dosage Given: 1.8 Gy
Session Number: 15

## 2024-03-02 LAB — MAGNESIUM: Magnesium: 1.9 mg/dL (ref 1.7–2.4)

## 2024-03-02 NOTE — Telephone Encounter (Signed)
 Oncology Pharmacist Encounter  I reviewed patient's labs from 03/02/24 while MD is out of town.  ANC 2.0 K/uL (downtrending) WBC 2.7 K/uL (downtrending) Scr: 0.94 mg/dL (CrCl ~68 mL/min)  Creatinine remains stable since last week - no dosage change for patient indicated at this time.   Asberry Macintosh, PharmD, BCPS, BCOP Hematology/Oncology Clinical Pharmacist [phone] 03/02/2024 11:29 AM

## 2024-03-05 ENCOUNTER — Other Ambulatory Visit: Payer: Self-pay

## 2024-03-05 ENCOUNTER — Ambulatory Visit
Admission: RE | Admit: 2024-03-05 | Discharge: 2024-03-05 | Disposition: A | Discharge status: Home / Self Care | Payer: Medicare (Managed Care) | Source: Ambulatory Visit | Attending: Radiation Oncology

## 2024-03-05 ENCOUNTER — Ambulatory Visit: Payer: Self-pay | Admitting: Nurse Practitioner

## 2024-03-05 DIAGNOSIS — Z510 Encounter for antineoplastic radiation therapy: Secondary | ICD-10-CM | POA: Diagnosis not present

## 2024-03-05 LAB — RAD ONC ARIA SESSION SUMMARY
Course Elapsed Days: 21
Plan Fractions Treated to Date: 16
Plan Prescribed Dose Per Fraction: 1.8 Gy
Plan Total Fractions Prescribed: 25
Plan Total Prescribed Dose: 45 Gy
Reference Point Dosage Given to Date: 28.8 Gy
Reference Point Session Dosage Given: 1.8 Gy
Session Number: 16

## 2024-03-06 ENCOUNTER — Ambulatory Visit
Admission: RE | Admit: 2024-03-06 | Discharge: 2024-03-06 | Disposition: A | Discharge status: Home / Self Care | Payer: Medicare (Managed Care) | Source: Ambulatory Visit | Attending: Radiation Oncology

## 2024-03-06 ENCOUNTER — Encounter: Payer: Self-pay | Admitting: Radiation Oncology

## 2024-03-06 ENCOUNTER — Other Ambulatory Visit: Payer: Self-pay

## 2024-03-06 ENCOUNTER — Other Ambulatory Visit: Payer: Self-pay | Admitting: Radiation Oncology

## 2024-03-06 ENCOUNTER — Ambulatory Visit
Admission: RE | Admit: 2024-03-06 | Discharge: 2024-03-06 | Disposition: A | Discharge status: Home / Self Care | Payer: Medicare (Managed Care) | Source: Ambulatory Visit | Attending: Radiation Oncology | Admitting: Radiation Oncology

## 2024-03-06 DIAGNOSIS — Z510 Encounter for antineoplastic radiation therapy: Secondary | ICD-10-CM | POA: Diagnosis not present

## 2024-03-06 LAB — RAD ONC ARIA SESSION SUMMARY
Course Elapsed Days: 22
Plan Fractions Treated to Date: 17
Plan Prescribed Dose Per Fraction: 1.8 Gy
Plan Total Fractions Prescribed: 25
Plan Total Prescribed Dose: 45 Gy
Reference Point Dosage Given to Date: 30.6 Gy
Reference Point Session Dosage Given: 1.8 Gy
Session Number: 17

## 2024-03-06 MED ORDER — ONDANSETRON HCL 8 MG PO TABS: 8.0000 mg | ORAL_TABLET | Freq: Three times a day (TID) | ORAL | 1 refills | Status: DC | PRN

## 2024-03-06 MED ORDER — LORAZEPAM 0.5 MG PO TABS: 0.2500 mg | ORAL_TABLET | Freq: Four times a day (QID) | ORAL | 0 refills | Status: DC | PRN

## 2024-03-06 NOTE — Progress Notes (Signed)
 The patient is currently receiving radiotherapy to the abdomen for extrahepatic Cholangiocarcinoma of the lower third of the common bile duct. She's received 17 of the planned 30  fractions to the abdomen. She has developed nausea over the weekend and has taken Zofran  4 mg prn, and was encouraged to consider scheduling this but when time came to redose decided she felt well enough to forgo, and while yesterday she felt okay in the morning, has been having persistent nausea and vomiting liquids intermittently though she was able to keep down her breakfast. She has taken a 4 mg Zofran  this morning around 8 am and is curious if there is anything else that can be taken for this.   On Exam T 96.8 degrees F P 70 RR 20 BP 148/70 O2 sat 100% RA In general this is a tired frail appearing caucasian female in no acute distress. She's alert and oriented x4 and appropriate throughout the examination. Cardiopulmonary assessment is negative for acute distress and she exhibits normal effort.   In reviewing her lab work from Friday it appears that her chemistries kidney function and liver function test were all within an acceptable range for the regimen and treatment she is receiving.  Her white blood cell count is trending down but the patient is not experiencing any fevers, scleral icterus, itching or changes in the color of her urine or stool.  She denies abdominal pain.  She clinically does not appear to have evidence of biliary obstruction and has an indwelling plastic stent that GI plans to swap out for a bare-metal stent after completing radiation.  Her vital signs and recent blood work do not indicate a need in my opinion for IV fluids and rather we talked about increasing oral hydration, maximizing antiemetics and increasing Zofran  to 8 mg with a new prescription for this to continue every 8 hours, and adding a low-dose of Ativan  0.25 mg every 6 hours if needed in addition to the Zofran .  I will also copy Dr.  Conchetta to see if he has any other concerns or recommendations.  We will continue with plans for radiation to resume tomorrow.     Donald KYM Husband, PAC

## 2024-03-07 ENCOUNTER — Ambulatory Visit
Admission: RE | Admit: 2024-03-07 | Discharge: 2024-03-07 | Disposition: A | Discharge status: Home / Self Care | Payer: Medicare (Managed Care) | Source: Ambulatory Visit | Attending: Radiation Oncology

## 2024-03-07 ENCOUNTER — Other Ambulatory Visit: Payer: Self-pay

## 2024-03-07 DIAGNOSIS — Z510 Encounter for antineoplastic radiation therapy: Secondary | ICD-10-CM | POA: Diagnosis not present

## 2024-03-07 DIAGNOSIS — C240 Malignant neoplasm of extrahepatic bile duct: Secondary | ICD-10-CM | POA: Diagnosis not present

## 2024-03-07 LAB — RAD ONC ARIA SESSION SUMMARY
Course Elapsed Days: 23
Plan Fractions Treated to Date: 18
Plan Prescribed Dose Per Fraction: 1.8 Gy
Plan Total Fractions Prescribed: 25
Plan Total Prescribed Dose: 45 Gy
Reference Point Dosage Given to Date: 32.4 Gy
Reference Point Session Dosage Given: 1.8 Gy
Session Number: 18

## 2024-03-08 ENCOUNTER — Other Ambulatory Visit: Payer: Self-pay

## 2024-03-08 ENCOUNTER — Ambulatory Visit
Admission: RE | Admit: 2024-03-08 | Discharge: 2024-03-08 | Disposition: A | Discharge status: Home / Self Care | Payer: Medicare (Managed Care) | Source: Ambulatory Visit | Attending: Radiation Oncology | Admitting: Radiation Oncology

## 2024-03-08 ENCOUNTER — Ambulatory Visit
Admission: RE | Admit: 2024-03-08 | Discharge: 2024-03-08 | Disposition: A | Discharge status: Home / Self Care | Payer: Medicare (Managed Care) | Source: Ambulatory Visit | Attending: Radiation Oncology

## 2024-03-08 DIAGNOSIS — Z510 Encounter for antineoplastic radiation therapy: Secondary | ICD-10-CM | POA: Diagnosis not present

## 2024-03-08 LAB — RAD ONC ARIA SESSION SUMMARY
Course Elapsed Days: 24
Plan Fractions Treated to Date: 19
Plan Prescribed Dose Per Fraction: 1.8 Gy
Plan Total Fractions Prescribed: 25
Plan Total Prescribed Dose: 45 Gy
Reference Point Dosage Given to Date: 34.2 Gy
Reference Point Session Dosage Given: 1.8 Gy
Session Number: 19

## 2024-03-09 ENCOUNTER — Other Ambulatory Visit: Payer: Self-pay

## 2024-03-09 ENCOUNTER — Ambulatory Visit: Payer: Medicare (Managed Care)

## 2024-03-09 ENCOUNTER — Telehealth: Payer: Self-pay

## 2024-03-09 ENCOUNTER — Inpatient Hospital Stay: Payer: Medicare (Managed Care)

## 2024-03-09 ENCOUNTER — Ambulatory Visit
Admission: RE | Admit: 2024-03-09 | Discharge: 2024-03-09 | Disposition: A | Discharge status: Home / Self Care | Payer: Medicare (Managed Care) | Source: Ambulatory Visit | Attending: Radiation Oncology | Admitting: Radiation Oncology

## 2024-03-09 DIAGNOSIS — C240 Malignant neoplasm of extrahepatic bile duct: Secondary | ICD-10-CM

## 2024-03-09 DIAGNOSIS — Z510 Encounter for antineoplastic radiation therapy: Secondary | ICD-10-CM | POA: Diagnosis not present

## 2024-03-09 DIAGNOSIS — C221 Intrahepatic bile duct carcinoma: Secondary | ICD-10-CM | POA: Diagnosis not present

## 2024-03-09 LAB — CBC WITH DIFFERENTIAL (CANCER CENTER ONLY)
Abs Immature Granulocytes: 0.01 K/uL (ref 0.00–0.07)
Basophils Absolute: 0 K/uL (ref 0.0–0.1)
Basophils Relative: 1 %
Eosinophils Absolute: 0.1 K/uL (ref 0.0–0.5)
Eosinophils Relative: 3 %
HCT: 33.2 % — ABNORMAL LOW (ref 36.0–46.0)
Hemoglobin: 11.5 g/dL — ABNORMAL LOW (ref 12.0–15.0)
Immature Granulocytes: 0 %
Lymphocytes Relative: 14 %
Lymphs Abs: 0.4 K/uL — ABNORMAL LOW (ref 0.7–4.0)
MCH: 31.6 pg (ref 26.0–34.0)
MCHC: 34.6 g/dL (ref 30.0–36.0)
MCV: 91.2 fL (ref 80.0–100.0)
Monocytes Absolute: 0.3 K/uL (ref 0.1–1.0)
Monocytes Relative: 10 %
Neutro Abs: 1.9 K/uL (ref 1.7–7.7)
Neutrophils Relative %: 72 %
Platelet Count: 176 K/uL (ref 150–400)
RBC: 3.64 MIL/uL — ABNORMAL LOW (ref 3.87–5.11)
RDW: 13 % (ref 11.5–15.5)
WBC Count: 2.7 K/uL — ABNORMAL LOW (ref 4.0–10.5)
nRBC: 0 % (ref 0.0–0.2)

## 2024-03-09 LAB — CMP (CANCER CENTER ONLY)
ALT: 11 U/L (ref 0–44)
AST: 19 U/L (ref 15–41)
Albumin: 4.4 g/dL (ref 3.5–5.0)
Alkaline Phosphatase: 41 U/L (ref 38–126)
Anion gap: 6 (ref 5–15)
BUN: 14 mg/dL (ref 8–23)
CO2: 30 mmol/L (ref 22–32)
Calcium: 9.4 mg/dL (ref 8.9–10.3)
Chloride: 100 mmol/L (ref 98–111)
Creatinine: 0.98 mg/dL (ref 0.44–1.00)
GFR, Estimated: 57 mL/min — ABNORMAL LOW (ref >60)
Glucose, Bld: 156 mg/dL — ABNORMAL HIGH (ref 70–99)
Potassium: 3.6 mmol/L (ref 3.5–5.1)
Sodium: 136 mmol/L (ref 135–145)
Total Bilirubin: 0.5 mg/dL (ref 0.0–1.2)
Total Protein: 6.8 g/dL (ref 6.5–8.1)

## 2024-03-09 LAB — RAD ONC ARIA SESSION SUMMARY
Course Elapsed Days: 25
Plan Fractions Treated to Date: 20
Plan Prescribed Dose Per Fraction: 1.8 Gy
Plan Total Fractions Prescribed: 25
Plan Total Prescribed Dose: 45 Gy
Reference Point Dosage Given to Date: 36 Gy
Reference Point Session Dosage Given: 1.8 Gy
Session Number: 20

## 2024-03-09 LAB — MAGNESIUM: Magnesium: 1.7 mg/dL (ref 1.7–2.4)

## 2024-03-09 NOTE — Telephone Encounter (Signed)
 Oncology Pharmacist Encounter   I reviewed patient's labs from 03/09/24 while MD is out of town.   ANC 1.9 K/uL (downtrending) WBC 2.7 K/uL (stable since last labs) Scr: 0.98 mg/dL (CrCl ~69 mL/min)   Creatinine remains stable but is slightly increasing the past two weeks. Adjustment today not needed but will need to continue to monitor ANC as it is down trending.   Nakota Ackert, PharmD Hematology/Oncology Clinical Pharmacist [facility] [phone]

## 2024-03-12 ENCOUNTER — Ambulatory Visit
Admission: RE | Admit: 2024-03-12 | Discharge: 2024-03-12 | Disposition: A | Discharge status: Home / Self Care | Payer: Medicare (Managed Care) | Source: Ambulatory Visit | Attending: Radiation Oncology

## 2024-03-12 ENCOUNTER — Other Ambulatory Visit: Payer: Self-pay

## 2024-03-12 DIAGNOSIS — Z510 Encounter for antineoplastic radiation therapy: Secondary | ICD-10-CM | POA: Diagnosis not present

## 2024-03-12 LAB — RAD ONC ARIA SESSION SUMMARY
Course Elapsed Days: 28
Plan Fractions Treated to Date: 21
Plan Prescribed Dose Per Fraction: 1.8 Gy
Plan Total Fractions Prescribed: 25
Plan Total Prescribed Dose: 45 Gy
Reference Point Dosage Given to Date: 37.8 Gy
Reference Point Session Dosage Given: 1.8 Gy
Session Number: 21

## 2024-03-13 ENCOUNTER — Other Ambulatory Visit: Payer: Self-pay

## 2024-03-13 ENCOUNTER — Ambulatory Visit
Admission: RE | Admit: 2024-03-13 | Discharge: 2024-03-13 | Disposition: A | Discharge status: Home / Self Care | Payer: Medicare (Managed Care) | Source: Ambulatory Visit | Attending: Radiation Oncology

## 2024-03-13 DIAGNOSIS — E785 Hyperlipidemia, unspecified: Secondary | ICD-10-CM | POA: Diagnosis not present

## 2024-03-13 DIAGNOSIS — Z811 Family history of alcohol abuse and dependence: Secondary | ICD-10-CM | POA: Diagnosis not present

## 2024-03-13 DIAGNOSIS — C221 Intrahepatic bile duct carcinoma: Secondary | ICD-10-CM | POA: Diagnosis not present

## 2024-03-13 DIAGNOSIS — K219 Gastro-esophageal reflux disease without esophagitis: Secondary | ICD-10-CM | POA: Diagnosis not present

## 2024-03-13 DIAGNOSIS — B9681 Helicobacter pylori [H. pylori] as the cause of diseases classified elsewhere: Secondary | ICD-10-CM | POA: Diagnosis not present

## 2024-03-13 DIAGNOSIS — Z8249 Family history of ischemic heart disease and other diseases of the circulatory system: Secondary | ICD-10-CM | POA: Diagnosis not present

## 2024-03-13 DIAGNOSIS — Z833 Family history of diabetes mellitus: Secondary | ICD-10-CM | POA: Diagnosis not present

## 2024-03-13 DIAGNOSIS — D649 Anemia, unspecified: Secondary | ICD-10-CM | POA: Diagnosis not present

## 2024-03-13 DIAGNOSIS — A09 Infectious gastroenteritis and colitis, unspecified: Secondary | ICD-10-CM | POA: Diagnosis not present

## 2024-03-13 DIAGNOSIS — Z7982 Long term (current) use of aspirin: Secondary | ICD-10-CM | POA: Diagnosis not present

## 2024-03-13 DIAGNOSIS — D6959 Other secondary thrombocytopenia: Secondary | ICD-10-CM | POA: Diagnosis not present

## 2024-03-13 DIAGNOSIS — Z923 Personal history of irradiation: Secondary | ICD-10-CM | POA: Diagnosis not present

## 2024-03-13 DIAGNOSIS — E876 Hypokalemia: Secondary | ICD-10-CM | POA: Diagnosis not present

## 2024-03-13 DIAGNOSIS — K529 Noninfective gastroenteritis and colitis, unspecified: Secondary | ICD-10-CM | POA: Diagnosis not present

## 2024-03-13 LAB — RAD ONC ARIA SESSION SUMMARY
Course Elapsed Days: 29
Plan Fractions Treated to Date: 22
Plan Prescribed Dose Per Fraction: 1.8 Gy
Plan Total Fractions Prescribed: 25
Plan Total Prescribed Dose: 45 Gy
Reference Point Dosage Given to Date: 39.6 Gy
Reference Point Session Dosage Given: 1.8 Gy
Session Number: 22

## 2024-03-14 ENCOUNTER — Ambulatory Visit
Admission: RE | Admit: 2024-03-14 | Discharge: 2024-03-14 | Disposition: A | Discharge status: Home / Self Care | Payer: Medicare (Managed Care) | Source: Ambulatory Visit | Attending: Radiation Oncology | Admitting: Radiation Oncology

## 2024-03-14 ENCOUNTER — Other Ambulatory Visit: Payer: Self-pay

## 2024-03-14 LAB — RAD ONC ARIA SESSION SUMMARY
Course Elapsed Days: 30
Plan Fractions Treated to Date: 23
Plan Prescribed Dose Per Fraction: 1.8 Gy
Plan Total Fractions Prescribed: 25
Plan Total Prescribed Dose: 45 Gy
Reference Point Dosage Given to Date: 41.4 Gy
Reference Point Session Dosage Given: 1.8 Gy
Session Number: 23

## 2024-03-15 ENCOUNTER — Ambulatory Visit
Admission: RE | Admit: 2024-03-15 | Discharge: 2024-03-15 | Disposition: A | Discharge status: Home / Self Care | Payer: Medicare (Managed Care) | Source: Ambulatory Visit | Attending: Radiation Oncology | Admitting: Radiation Oncology

## 2024-03-15 ENCOUNTER — Other Ambulatory Visit: Payer: Self-pay

## 2024-03-15 LAB — RAD ONC ARIA SESSION SUMMARY
Course Elapsed Days: 31
Plan Fractions Treated to Date: 24
Plan Prescribed Dose Per Fraction: 1.8 Gy
Plan Total Fractions Prescribed: 25
Plan Total Prescribed Dose: 45 Gy
Reference Point Dosage Given to Date: 43.2 Gy
Reference Point Session Dosage Given: 1.8 Gy
Session Number: 24

## 2024-03-16 ENCOUNTER — Other Ambulatory Visit: Payer: Self-pay

## 2024-03-16 ENCOUNTER — Inpatient Hospital Stay: Payer: Medicare (Managed Care)

## 2024-03-16 ENCOUNTER — Ambulatory Visit
Admission: RE | Admit: 2024-03-16 | Discharge: 2024-03-16 | Disposition: A | Discharge status: Home / Self Care | Payer: Medicare (Managed Care) | Source: Ambulatory Visit | Attending: Radiation Oncology | Admitting: Radiation Oncology

## 2024-03-16 ENCOUNTER — Emergency Department (HOSPITAL_COMMUNITY): Payer: Medicare (Managed Care)

## 2024-03-16 ENCOUNTER — Encounter (HOSPITAL_COMMUNITY): Payer: Self-pay

## 2024-03-16 ENCOUNTER — Inpatient Hospital Stay (HOSPITAL_COMMUNITY)
Admission: EM | Admit: 2024-03-16 | Discharge: 2024-03-22 | DRG: 393 | Disposition: A | Discharge status: Home Health | Payer: Medicare (Managed Care) | Attending: Internal Medicine | Admitting: Internal Medicine

## 2024-03-16 DIAGNOSIS — Z841 Family history of disorders of kidney and ureter: Secondary | ICD-10-CM | POA: Diagnosis not present

## 2024-03-16 DIAGNOSIS — R1084 Generalized abdominal pain: Secondary | ICD-10-CM | POA: Diagnosis not present

## 2024-03-16 DIAGNOSIS — D6959 Other secondary thrombocytopenia: Secondary | ICD-10-CM | POA: Diagnosis present

## 2024-03-16 DIAGNOSIS — M549 Dorsalgia, unspecified: Secondary | ICD-10-CM | POA: Diagnosis present

## 2024-03-16 DIAGNOSIS — Z7982 Long term (current) use of aspirin: Secondary | ICD-10-CM | POA: Diagnosis not present

## 2024-03-16 DIAGNOSIS — I700 Atherosclerosis of aorta: Secondary | ICD-10-CM | POA: Diagnosis present

## 2024-03-16 DIAGNOSIS — C240 Malignant neoplasm of extrahepatic bile duct: Secondary | ICD-10-CM | POA: Diagnosis not present

## 2024-03-16 DIAGNOSIS — Z923 Personal history of irradiation: Secondary | ICD-10-CM

## 2024-03-16 DIAGNOSIS — K760 Fatty (change of) liver, not elsewhere classified: Secondary | ICD-10-CM | POA: Diagnosis present

## 2024-03-16 DIAGNOSIS — L89151 Pressure ulcer of sacral region, stage 1: Secondary | ICD-10-CM | POA: Diagnosis present

## 2024-03-16 DIAGNOSIS — K831 Obstruction of bile duct: Secondary | ICD-10-CM | POA: Diagnosis present

## 2024-03-16 DIAGNOSIS — M810 Age-related osteoporosis without current pathological fracture: Secondary | ICD-10-CM | POA: Diagnosis present

## 2024-03-16 DIAGNOSIS — D649 Anemia, unspecified: Secondary | ICD-10-CM | POA: Diagnosis not present

## 2024-03-16 DIAGNOSIS — E785 Hyperlipidemia, unspecified: Secondary | ICD-10-CM | POA: Diagnosis present

## 2024-03-16 DIAGNOSIS — Z8509 Personal history of malignant neoplasm of other digestive organs: Secondary | ICD-10-CM

## 2024-03-16 DIAGNOSIS — E876 Hypokalemia: Secondary | ICD-10-CM | POA: Diagnosis present

## 2024-03-16 DIAGNOSIS — F0153 Vascular dementia, unspecified severity, with mood disturbance: Secondary | ICD-10-CM | POA: Diagnosis present

## 2024-03-16 DIAGNOSIS — Z823 Family history of stroke: Secondary | ICD-10-CM

## 2024-03-16 DIAGNOSIS — L8990 Pressure ulcer of unspecified site, unspecified stage: Secondary | ICD-10-CM | POA: Diagnosis present

## 2024-03-16 DIAGNOSIS — D61818 Other pancytopenia: Secondary | ICD-10-CM | POA: Diagnosis present

## 2024-03-16 DIAGNOSIS — C221 Intrahepatic bile duct carcinoma: Principal | ICD-10-CM | POA: Diagnosis present

## 2024-03-16 DIAGNOSIS — K2971 Gastritis, unspecified, with bleeding: Secondary | ICD-10-CM | POA: Diagnosis present

## 2024-03-16 DIAGNOSIS — Z4659 Encounter for fitting and adjustment of other gastrointestinal appliance and device: Secondary | ICD-10-CM | POA: Diagnosis not present

## 2024-03-16 DIAGNOSIS — R531 Weakness: Secondary | ICD-10-CM | POA: Diagnosis not present

## 2024-03-16 DIAGNOSIS — E782 Mixed hyperlipidemia: Secondary | ICD-10-CM | POA: Diagnosis not present

## 2024-03-16 DIAGNOSIS — Z860100 Personal history of colon polyps, unspecified: Secondary | ICD-10-CM

## 2024-03-16 DIAGNOSIS — Z8249 Family history of ischemic heart disease and other diseases of the circulatory system: Secondary | ICD-10-CM | POA: Diagnosis not present

## 2024-03-16 DIAGNOSIS — F32A Depression, unspecified: Secondary | ICD-10-CM | POA: Diagnosis present

## 2024-03-16 DIAGNOSIS — Z811 Family history of alcohol abuse and dependence: Secondary | ICD-10-CM | POA: Diagnosis not present

## 2024-03-16 DIAGNOSIS — B9681 Helicobacter pylori [H. pylori] as the cause of diseases classified elsewhere: Secondary | ICD-10-CM | POA: Diagnosis present

## 2024-03-16 DIAGNOSIS — Z833 Family history of diabetes mellitus: Secondary | ICD-10-CM | POA: Diagnosis not present

## 2024-03-16 DIAGNOSIS — F01A0 Vascular dementia, mild, without behavioral disturbance, psychotic disturbance, mood disturbance, and anxiety: Secondary | ICD-10-CM | POA: Diagnosis not present

## 2024-03-16 DIAGNOSIS — Z820 Family history of epilepsy and other diseases of the nervous system: Secondary | ICD-10-CM | POA: Diagnosis not present

## 2024-03-16 DIAGNOSIS — I10 Essential (primary) hypertension: Secondary | ICD-10-CM | POA: Diagnosis present

## 2024-03-16 DIAGNOSIS — Z9889 Other specified postprocedural states: Secondary | ICD-10-CM | POA: Diagnosis not present

## 2024-03-16 DIAGNOSIS — D6481 Anemia due to antineoplastic chemotherapy: Secondary | ICD-10-CM | POA: Diagnosis present

## 2024-03-16 DIAGNOSIS — T451X5A Adverse effect of antineoplastic and immunosuppressive drugs, initial encounter: Secondary | ICD-10-CM | POA: Diagnosis present

## 2024-03-16 DIAGNOSIS — K5900 Constipation, unspecified: Secondary | ICD-10-CM | POA: Diagnosis present

## 2024-03-16 DIAGNOSIS — K529 Noninfective gastroenteritis and colitis, unspecified: Secondary | ICD-10-CM | POA: Diagnosis present

## 2024-03-16 DIAGNOSIS — Z66 Do not resuscitate: Secondary | ICD-10-CM | POA: Diagnosis present

## 2024-03-16 DIAGNOSIS — A09 Infectious gastroenteritis and colitis, unspecified: Secondary | ICD-10-CM | POA: Diagnosis present

## 2024-03-16 DIAGNOSIS — Z90710 Acquired absence of both cervix and uterus: Secondary | ICD-10-CM

## 2024-03-16 DIAGNOSIS — G43909 Migraine, unspecified, not intractable, without status migrainosus: Secondary | ICD-10-CM | POA: Diagnosis present

## 2024-03-16 DIAGNOSIS — R609 Edema, unspecified: Secondary | ICD-10-CM | POA: Diagnosis present

## 2024-03-16 DIAGNOSIS — K521 Toxic gastroenteritis and colitis: Secondary | ICD-10-CM | POA: Diagnosis present

## 2024-03-16 DIAGNOSIS — R112 Nausea with vomiting, unspecified: Secondary | ICD-10-CM | POA: Diagnosis not present

## 2024-03-16 DIAGNOSIS — Z79899 Other long term (current) drug therapy: Secondary | ICD-10-CM

## 2024-03-16 DIAGNOSIS — Z510 Encounter for antineoplastic radiation therapy: Secondary | ICD-10-CM | POA: Diagnosis not present

## 2024-03-16 DIAGNOSIS — F0150 Vascular dementia without behavioral disturbance: Secondary | ICD-10-CM | POA: Diagnosis present

## 2024-03-16 DIAGNOSIS — K219 Gastro-esophageal reflux disease without esophagitis: Secondary | ICD-10-CM | POA: Diagnosis present

## 2024-03-16 DIAGNOSIS — Z8505 Personal history of malignant neoplasm of liver: Secondary | ICD-10-CM

## 2024-03-16 DIAGNOSIS — R197 Diarrhea, unspecified: Secondary | ICD-10-CM | POA: Diagnosis not present

## 2024-03-16 LAB — COMPREHENSIVE METABOLIC PANEL WITH GFR
ALT: 13 U/L (ref 0–44)
AST: 24 U/L (ref 15–41)
Albumin: 3.6 g/dL (ref 3.5–5.0)
Alkaline Phosphatase: 39 U/L (ref 38–126)
Anion gap: 11 (ref 5–15)
BUN: 17 mg/dL (ref 8–23)
CO2: 22 mmol/L (ref 22–32)
Calcium: 9.1 mg/dL (ref 8.9–10.3)
Chloride: 103 mmol/L (ref 98–111)
Creatinine, Ser: 0.95 mg/dL (ref 0.44–1.00)
GFR, Estimated: 59 mL/min — ABNORMAL LOW (ref >60)
Glucose, Bld: 147 mg/dL — ABNORMAL HIGH (ref 70–99)
Potassium: 3.9 mmol/L (ref 3.5–5.1)
Sodium: 136 mmol/L (ref 135–145)
Total Bilirubin: 0.6 mg/dL (ref 0.0–1.2)
Total Protein: 6.3 g/dL — ABNORMAL LOW (ref 6.5–8.1)

## 2024-03-16 LAB — CBC WITH DIFFERENTIAL/PLATELET
Abs Immature Granulocytes: 0.04 K/uL (ref 0.00–0.07)
Basophils Absolute: 0 K/uL (ref 0.0–0.1)
Basophils Relative: 0 %
Eosinophils Absolute: 0.1 K/uL (ref 0.0–0.5)
Eosinophils Relative: 1 %
HCT: 31.4 % — ABNORMAL LOW (ref 36.0–46.0)
Hemoglobin: 10.6 g/dL — ABNORMAL LOW (ref 12.0–15.0)
Immature Granulocytes: 1 %
Lymphocytes Relative: 7 %
Lymphs Abs: 0.5 K/uL — ABNORMAL LOW (ref 0.7–4.0)
MCH: 31.6 pg (ref 26.0–34.0)
MCHC: 33.8 g/dL (ref 30.0–36.0)
MCV: 93.7 fL (ref 80.0–100.0)
Monocytes Absolute: 0.8 K/uL (ref 0.1–1.0)
Monocytes Relative: 11 %
Neutro Abs: 5.4 K/uL (ref 1.7–7.7)
Neutrophils Relative %: 80 %
Platelets: 189 K/uL (ref 150–400)
RBC: 3.35 MIL/uL — ABNORMAL LOW (ref 3.87–5.11)
RDW: 13.9 % (ref 11.5–15.5)
WBC: 6.7 K/uL (ref 4.0–10.5)
nRBC: 0 % (ref 0.0–0.2)

## 2024-03-16 LAB — RAD ONC ARIA SESSION SUMMARY
Course Elapsed Days: 32
Plan Fractions Treated to Date: 25
Plan Prescribed Dose Per Fraction: 1.8 Gy
Plan Total Fractions Prescribed: 25
Plan Total Prescribed Dose: 45 Gy
Reference Point Dosage Given to Date: 45 Gy
Reference Point Session Dosage Given: 1.8 Gy
Session Number: 25

## 2024-03-16 LAB — LIPASE, BLOOD: Lipase: 27 U/L (ref 11–51)

## 2024-03-16 MED ORDER — SODIUM CHLORIDE 0.9 % IV BOLUS
1000.0000 mL | Freq: Once | INTRAVENOUS | Status: AC
  Administered 2024-03-16: 1000 mL via INTRAVENOUS

## 2024-03-16 MED ORDER — HYDROMORPHONE HCL 1 MG/ML IJ SOLN
0.5000 mg | Freq: Once | INTRAMUSCULAR | Status: AC
  Administered 2024-03-16: 0.5 mg via INTRAVENOUS
  Filled 2024-03-16: qty 1

## 2024-03-16 MED ORDER — CIPROFLOXACIN IN D5W 400 MG/200ML IV SOLN
400.0000 mg | Freq: Once | INTRAVENOUS | Status: AC
  Administered 2024-03-16: 400 mg via INTRAVENOUS
  Filled 2024-03-16: qty 200

## 2024-03-16 MED ORDER — IOHEXOL 300 MG/ML  SOLN
100.0000 mL | Freq: Once | INTRAMUSCULAR | Status: AC | PRN
Start: 2024-03-16 — End: 2024-03-16
  Administered 2024-03-16: 100 mL via INTRAVENOUS

## 2024-03-16 MED ORDER — ONDANSETRON HCL 4 MG/2ML IJ SOLN
4.0000 mg | Freq: Once | INTRAMUSCULAR | Status: AC
  Administered 2024-03-16: 4 mg via INTRAVENOUS
  Filled 2024-03-16: qty 2

## 2024-03-16 MED ORDER — PANTOPRAZOLE SODIUM 40 MG IV SOLR
40.0000 mg | Freq: Once | INTRAVENOUS | Status: AC
  Administered 2024-03-16: 40 mg via INTRAVENOUS
  Filled 2024-03-16: qty 10

## 2024-03-16 MED ORDER — METRONIDAZOLE 500 MG/100ML IV SOLN
500.0000 mg | Freq: Once | INTRAVENOUS | Status: AC
  Administered 2024-03-17: 500 mg via INTRAVENOUS
  Filled 2024-03-16: qty 100

## 2024-03-16 NOTE — ED Notes (Signed)
 Patient transported to CT

## 2024-03-16 NOTE — ED Triage Notes (Signed)
 Pt came in via EMS from home w/ c/o N/V/D. Pt has been on oral chemo for bile duct cancer about 5 weeks. Has been experiencing these symptoms for about 3 weeks. HX of early onset dementia. Took zofran  before EMS arrival. EMS gave 50mcg of fentanyl  and 100 LR.

## 2024-03-16 NOTE — ED Provider Notes (Signed)
 [place] EMERGENCY DEPARTMENT AT [facility] Provider Note   CSN: [id number] Arrival date & time: 03/16/24  8060     Patient presents with: Abdominal Pain   Caroline Snyder is a 83 y.o. female.   Patient has a history of biliary cancer.  She has been having vomiting and diarrhea for couple days.  No known fever  The history is provided by the patient and medical records.  Abdominal Pain Pain location:  Generalized Pain quality: aching   Pain radiates to:  Does not radiate Pain severity:  Mild Onset quality:  Sudden Timing:  Constant Progression:  Worsening Chronicity:  New Context: not alcohol  use   Relieved by:  Nothing Associated symptoms: diarrhea and vomiting   Associated symptoms: no chest pain, no cough, no fatigue and no hematuria        Prior to Admission medications   Medication Sig Start Date End Date Taking? Authorizing Provider  capecitabine  (XELODA ) 500 MG tablet Take 2 tablets (1000mg ) by mouth twice daily within 30 mins of a meal. Take on Monday through Friday, during the days of radiation. 02/06/24  Yes Pasam, Avinash, MD  Ensure (ENSURE) Take 237 mLs by mouth 2 (two) times daily between meals.   Yes [provider]  LORazepam  (ATIVAN ) 0.5 MG tablet Take 0.5 tablets (0.25 mg total) by mouth every 6 (six) hours as needed (nausea not relieved by Zofran  (Ondansetron )). 03/06/24  Yes Lanell Donald Stagger, PA-C  losartan  (COZAAR ) 100 MG tablet Take 1 tablet (100 mg total) by mouth daily. Essential hypertension I10 04/29/23  Yes [name], NP  ondansetron  (ZOFRAN ) 8 MG tablet Take 1 tablet (8 mg total) by mouth every 8 (eight) hours as needed for nausea or vomiting. 03/06/24  Yes Lanell Donald Stagger, PA-C  pantoprazole  (PROTONIX ) 40 MG tablet Take 40 mg by mouth daily as needed.   Yes [provider]  rosuvastatin  (CRESTOR ) 20 MG tablet Take 0.5 tablets (10 mg total) by mouth daily. Patient taking differently: Take 20 mg  by mouth daily. 12/23/23  Yes Briana Elgin LABOR, MD  aspirin  EC 81 MG tablet Take 1 tablet (81 mg total) by mouth daily. Swallow whole. 10/31/20   [name], NP    Allergies: Aricept  [donepezil ] and Vioxx [rofecoxib]    Review of Systems  Constitutional:  Negative for appetite change and fatigue.  HENT:  Negative for congestion, ear discharge and sinus pressure.   Eyes:  Negative for discharge.  Respiratory:  Negative for cough.   Cardiovascular:  Negative for chest pain.  Gastrointestinal:  Positive for abdominal pain, diarrhea and vomiting.  Genitourinary:  Negative for frequency and hematuria.  Musculoskeletal:  Negative for back pain.  Skin:  Negative for rash.  Neurological:  Negative for seizures and headaches.  Psychiatric/Behavioral:  Negative for hallucinations.     Updated Vital Signs BP (!) 154/70 (BP Location: Right Arm)   Pulse 74   Temp 97.8 F (36.6 C) (Oral)   Resp 13   SpO2 95%   Physical Exam Vitals and nursing note reviewed.  Constitutional:      Appearance: She is well-developed.  HENT:     Head: Normocephalic.     Nose: Nose normal.  Eyes:     General: No scleral icterus.    Conjunctiva/sclera: Conjunctivae normal.  Neck:     Thyroid : No thyromegaly.  Cardiovascular:     Rate and Rhythm: Normal rate and regular rhythm.     Heart sounds: No murmur  heard.    No friction rub. No gallop.  Pulmonary:     Breath sounds: No stridor. No wheezing or rales.  Chest:     Chest wall: No tenderness.  Abdominal:     General: There is no distension.     Tenderness: There is no abdominal tenderness. There is no rebound.  Musculoskeletal:        General: Normal range of motion.     Cervical back: Neck supple.  Lymphadenopathy:     Cervical: No cervical adenopathy.  Skin:    Findings: No erythema or rash.  Neurological:     Mental Status: She is alert and oriented to person, place, and time.     Motor: No abnormal muscle tone.     Coordination:  Coordination normal.  Psychiatric:        Behavior: Behavior normal.     (all labs ordered are listed, but only abnormal results are displayed) Labs Reviewed  CBC WITH DIFFERENTIAL/PLATELET - Abnormal; Notable for the following components:      Result Value   RBC 3.35 (*)    Hemoglobin 10.6 (*)    HCT 31.4 (*)    Lymphs Abs 0.5 (*)    All other components within normal limits  COMPREHENSIVE METABOLIC PANEL WITH GFR - Abnormal; Notable for the following components:   Glucose, Bld 147 (*)    Total Protein 6.3 (*)    GFR, Estimated 59 (*)    All other components within normal limits  GASTROINTESTINAL PANEL BY PCR, STOOL (REPLACES STOOL CULTURE)  LIPASE, BLOOD  URINALYSIS, ROUTINE W REFLEX MICROSCOPIC    EKG: None  Radiology: CT ABDOMEN PELVIS W CONTRAST Result Date: 03/16/2024 CLINICAL DATA:  Nausea vomiting diarrhea history of bile duct cancer EXAM: CT ABDOMEN AND PELVIS WITH CONTRAST TECHNIQUE: Multidetector CT imaging of the abdomen and pelvis was performed using the standard protocol following bolus administration of intravenous contrast. RADIATION DOSE REDUCTION: This exam was performed according to the departmental dose-optimization program which includes automated exposure control, adjustment of the mA and/or kV according to patient size and/or use of iterative reconstruction technique. CONTRAST:  OMNIPAQUE  IOHEXOL  300 MG/ML  SOLN COMPARISON:  CT 12/29/2023, MRI 12/18/2023 FINDINGS: Lower chest: Lung bases demonstrate no acute airspace disease. Hepatobiliary: Hepatic steatosis. Pneumobilia. Common bile duct stent remains in place. No biliary distension. Decompressed gallbladder with small stone at the fundus. Indistinct hazy density surrounding the bile duct stent at the porta hepatis. Narrowed appearance of the portal vein at the porta hepatis by indistinct surrounding soft tissue, series 2, image 24, coronal series 8, image 41, and presumably related to the known history of  bowel duct malignancy. Pancreas: No ductal dilatation.  No definite inflammatory change. Spleen: Normal in size without focal abnormality. Adrenals/Urinary Tract: Adrenal glands are normal. Kidneys show no hydronephrosis. The bladder is unremarkable Stomach/Bowel: The stomach is decompressed. No dilated small bowel. Areas of wall thickening and pericolonic stranding, involves the cecum, ascending colon, hepatic flexure and transverse colon. Similar changes at the sigmoid colon. Negative appendix. Diverticular disease. Some fluid-filled small bowel in the right lower quadrant, mucosal enhancement of the terminal ileum and pelvic small bowel loops. Vascular/Lymphatic: Aortic atherosclerosis. No aneurysm. Small porta hepatis lymph nodes measuring up to 8 mm. As stated previously, focal narrowed appearance of the portal vein but without thrombus or occlusion. Reproductive: Status post hysterectomy. No adnexal masses. Other: Negative for pelvic effusion or free air. Musculoskeletal: No acute or suspicious osseous abnormality. IMPRESSION: IMPRESSION 1. Colon  wall thickening and pericolonic edema extends from the cecum through the transverse colon and also involving the sigmoid colon consistent with colitis of infectious, inflammatory or ischemic etiology. Fluid-filled small bowel in the right lower quadrant with mild mucosal enhancement of terminal ileum consistent with ileitis. 2. Common bile duct stent remains in place with pneumobilia. No biliary distension. Indistinct hazy soft tissue density at the porta hepatis, surrounding common bile duct stent and now with interval focal narrowed appearance of the portal vein, and presumably elated to the history of known bile duct malignancy. No evidence for portal vein thrombus or occlusion on this exam. 3. Hepatic steatosis. 4. Aortic atherosclerosis. Aortic Atherosclerosis (ICD10-I70.0). Electronically Signed   By: Luke Bun M.D.   On: 03/16/2024 22:01     Procedures    Medications Ordered in the ED  ciprofloxacin  (CIPRO ) IVPB 400 mg (400 mg Intravenous Incomplete 03/16/24 2307)  metroNIDAZOLE  (FLAGYL ) IVPB 500 mg (has no administration in time range)  sodium chloride  0.9 % bolus 1,000 mL (1,000 mLs Intravenous New Bag/Given 03/16/24 2030)  pantoprazole  (PROTONIX ) injection 40 mg (40 mg Intravenous Given 03/16/24 2035)  HYDROmorphone  (DILAUDID ) injection 0.5 mg (0.5 mg Intravenous Given 03/16/24 2033)  ondansetron  (ZOFRAN ) injection 4 mg (4 mg Intravenous Given 03/16/24 2032)  iohexol  (OMNIPAQUE ) 300 MG/ML solution 100 mL (100 mLs Intravenous Contrast Given 03/16/24 2059)                                    Medical Decision Making Amount and/or Complexity of Data Reviewed Labs: ordered. Radiology: ordered.  Risk Prescription drug management. Decision regarding hospitalization.   Patient with colitis.  She is started on Cipro  and Flagyl  and will be admitted to medicine with GI consult tomorrow     Final diagnoses:  None    ED Discharge Orders     None          Suzette Pac, MD 03/18/24 1011

## 2024-03-17 DIAGNOSIS — C221 Intrahepatic bile duct carcinoma: Secondary | ICD-10-CM | POA: Diagnosis present

## 2024-03-17 DIAGNOSIS — E782 Mixed hyperlipidemia: Secondary | ICD-10-CM

## 2024-03-17 DIAGNOSIS — R112 Nausea with vomiting, unspecified: Secondary | ICD-10-CM

## 2024-03-17 DIAGNOSIS — F0150 Vascular dementia without behavioral disturbance: Secondary | ICD-10-CM | POA: Diagnosis present

## 2024-03-17 DIAGNOSIS — D649 Anemia, unspecified: Secondary | ICD-10-CM | POA: Diagnosis present

## 2024-03-17 DIAGNOSIS — L8990 Pressure ulcer of unspecified site, unspecified stage: Secondary | ICD-10-CM | POA: Diagnosis present

## 2024-03-17 DIAGNOSIS — I10 Essential (primary) hypertension: Secondary | ICD-10-CM

## 2024-03-17 DIAGNOSIS — K529 Noninfective gastroenteritis and colitis, unspecified: Secondary | ICD-10-CM

## 2024-03-17 DIAGNOSIS — F01A0 Vascular dementia, mild, without behavioral disturbance, psychotic disturbance, mood disturbance, and anxiety: Secondary | ICD-10-CM

## 2024-03-17 LAB — CBC
HCT: 28.6 % — ABNORMAL LOW (ref 36.0–46.0)
Hemoglobin: 9.3 g/dL — ABNORMAL LOW (ref 12.0–15.0)
MCH: 31.2 pg (ref 26.0–34.0)
MCHC: 32.5 g/dL (ref 30.0–36.0)
MCV: 96 fL (ref 80.0–100.0)
Platelets: 136 K/uL — ABNORMAL LOW (ref 150–400)
RBC: 2.98 MIL/uL — ABNORMAL LOW (ref 3.87–5.11)
RDW: 13.9 % (ref 11.5–15.5)
WBC: 5.2 K/uL (ref 4.0–10.5)
nRBC: 0 % (ref 0.0–0.2)

## 2024-03-17 LAB — URINALYSIS, ROUTINE W REFLEX MICROSCOPIC
Bilirubin Urine: NEGATIVE
Glucose, UA: 50 mg/dL — AB
Hgb urine dipstick: NEGATIVE
Ketones, ur: 5 mg/dL — AB
Leukocytes,Ua: NEGATIVE
Nitrite: NEGATIVE
Protein, ur: NEGATIVE mg/dL
Specific Gravity, Urine: >1.046 — ABNORMAL HIGH (ref 1.005–1.030)
pH: 5 (ref 5.0–8.0)

## 2024-03-17 LAB — MAGNESIUM: Magnesium: 1.8 mg/dL (ref 1.7–2.4)

## 2024-03-17 LAB — BASIC METABOLIC PANEL WITH GFR
Anion gap: 8 (ref 5–15)
BUN: 14 mg/dL (ref 8–23)
CO2: 22 mmol/L (ref 22–32)
Calcium: 8.2 mg/dL — ABNORMAL LOW (ref 8.9–10.3)
Chloride: 105 mmol/L (ref 98–111)
Creatinine, Ser: 0.84 mg/dL (ref 0.44–1.00)
GFR, Estimated: >60 mL/min (ref >60)
Glucose, Bld: 127 mg/dL — ABNORMAL HIGH (ref 70–99)
Potassium: 4.1 mmol/L (ref 3.5–5.1)
Sodium: 135 mmol/L (ref 135–145)

## 2024-03-17 LAB — LACTIC ACID, PLASMA: Lactic Acid, Venous: 1.6 mmol/L (ref 0.5–1.9)

## 2024-03-17 MED ORDER — ROSUVASTATIN CALCIUM 20 MG PO TABS
20.0000 mg | ORAL_TABLET | Freq: Every day | ORAL | Status: DC
Start: 2024-03-17 — End: 2024-03-17
  Filled 2024-03-17: qty 1

## 2024-03-17 MED ORDER — SODIUM CHLORIDE 0.9 % IV SOLN
2.0000 g | INTRAVENOUS | Status: DC
  Administered 2024-03-17 – 2024-03-22 (×6): 2 g via INTRAVENOUS
  Filled 2024-03-17 (×6): qty 20

## 2024-03-17 MED ORDER — ASPIRIN 81 MG PO TBEC
81.0000 mg | DELAYED_RELEASE_TABLET | Freq: Every day | ORAL | Status: DC
  Administered 2024-03-18 – 2024-03-22 (×4): 81 mg via ORAL
  Filled 2024-03-17 (×5): qty 1

## 2024-03-17 MED ORDER — ENSURE PO LIQD: 237.0000 mL | Freq: Two times a day (BID) | ORAL | Status: DC

## 2024-03-17 MED ORDER — ENOXAPARIN SODIUM 30 MG/0.3ML IJ SOSY: 30.0000 mg | PREFILLED_SYRINGE | INTRAMUSCULAR | Status: DC

## 2024-03-17 MED ORDER — FAMOTIDINE IN NACL 20-0.9 MG/50ML-% IV SOLN
20.0000 mg | INTRAVENOUS | Status: DC
  Administered 2024-03-17 – 2024-03-20 (×4): 20 mg via INTRAVENOUS
  Filled 2024-03-17 (×4): qty 50

## 2024-03-17 MED ORDER — LORAZEPAM 0.5 MG PO TABS
0.2500 mg | ORAL_TABLET | Freq: Four times a day (QID) | ORAL | Status: DC | PRN
  Administered 2024-03-18 – 2024-03-20 (×4): 0.25 mg via ORAL
  Filled 2024-03-17 (×4): qty 1

## 2024-03-17 MED ORDER — ACETAMINOPHEN 325 MG PO TABS
650.0000 mg | ORAL_TABLET | Freq: Four times a day (QID) | ORAL | Status: DC | PRN
  Administered 2024-03-17 – 2024-03-22 (×7): 650 mg via ORAL
  Filled 2024-03-17 (×8): qty 2

## 2024-03-17 MED ORDER — HYDRALAZINE HCL 20 MG/ML IJ SOLN
5.0000 mg | Freq: Four times a day (QID) | INTRAMUSCULAR | Status: DC | PRN
  Administered 2024-03-20: 5 mg via INTRAVENOUS
  Filled 2024-03-17: qty 1

## 2024-03-17 MED ORDER — METRONIDAZOLE 500 MG/100ML IV SOLN
500.0000 mg | Freq: Two times a day (BID) | INTRAVENOUS | Status: DC
  Administered 2024-03-17 – 2024-03-22 (×11): 500 mg via INTRAVENOUS
  Filled 2024-03-17 (×11): qty 100

## 2024-03-17 MED ORDER — ONDANSETRON HCL 4 MG/2ML IJ SOLN
4.0000 mg | Freq: Four times a day (QID) | INTRAMUSCULAR | Status: DC | PRN
  Administered 2024-03-17 – 2024-03-22 (×9): 4 mg via INTRAVENOUS
  Filled 2024-03-17 (×9): qty 2

## 2024-03-17 MED ORDER — ACETAMINOPHEN 650 MG RE SUPP: 650.0000 mg | Freq: Four times a day (QID) | RECTAL | Status: DC | PRN

## 2024-03-17 MED ORDER — SODIUM CHLORIDE 0.9 % IV SOLN
INTRAVENOUS | Status: DC
  Administered 2024-03-17: 125 mL/h via INTRAVENOUS

## 2024-03-17 MED ORDER — ENSURE PLUS HIGH PROTEIN PO LIQD
237.0000 mL | Freq: Two times a day (BID) | ORAL | Status: DC
  Administered 2024-03-17 – 2024-03-21 (×4): 237 mL via ORAL

## 2024-03-17 MED ORDER — SODIUM CHLORIDE 0.9 % IV SOLN
INTRAVENOUS | Status: DC
  Administered 2024-03-18: 125 mL/h via INTRAVENOUS

## 2024-03-17 NOTE — Plan of Care (Signed)
   Problem: Education: Goal: Knowledge of General Education information will improve Description: Including pain rating scale, medication(s)/side effects and non-pharmacologic comfort measures Outcome: Progressing   Problem: Clinical Measurements: Goal: Ability to maintain clinical measurements within normal limits will improve Outcome: Progressing Goal: Diagnostic test results will improve Outcome: Progressing Goal: Respiratory complications will improve Outcome: Progressing Goal: Cardiovascular complication will be avoided Outcome: Progressing   Problem: Health Behavior/Discharge Planning: Goal: Ability to manage health-related needs will improve Outcome: Not Progressing   Problem: Clinical Measurements: Goal: Will remain free from infection Outcome: Not Progressing

## 2024-03-17 NOTE — H&P (Signed)
 History and Physical    LEASIA SWANN FMW:[id number] DOB: 06/16/1941 DOA: 03/16/2024  PCP: [name], NP  Patient coming from: Home  Chief Complaint: Nausea, vomiting, diarrhea  HPI: CAELA HUOT is a 83 y.o. 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  Patient is able to give limited history.  History provided mostly by her son at bedside who states that for the past 2 weeks patient is having nausea, vomiting, and very poor p.o. intake.  On Wednesday and Thursday this week she had nonbloody diarrhea which stopped yesterday.  Patient denies fevers, chills, or abdominal pain.  Denies shortness of breath or chest pain.  ED Course: EMS gave 50 mcg of fentanyl  and 100 mL LR.  Slightly hypertensive on arrival but remainder of vital signs stable.  Labs showing no leukocytosis, hemoglobin 10.6 (no significant drop compared to recent labs), MCV 93.7, normal lipase and LFTs, UA pending, GI pathogen panel pending.     CT abdomen pelvis with contrast showing: IMPRESSION 1. Colon wall thickening and pericolonic edema extends from the cecum through the transverse colon and also involving the sigmoid colon consistent with colitis of infectious, inflammatory or ischemic etiology. Fluid-filled small bowel in the right lower quadrant with mild mucosal enhancement of terminal ileum consistent with ileitis. 2. Common bile duct stent remains in place with pneumobilia. No biliary distension. Indistinct hazy soft tissue density at the porta hepatis, surrounding common bile duct stent and now with interval focal narrowed appearance of the portal vein, and presumably elated to the history of known bile duct malignancy. No evidence for  portal vein thrombus or occlusion on this exam. 3. Hepatic steatosis. 4. Aortic atherosclerosis.   Patient was given Dilaudid , Zofran , IV Protonix , ciprofloxacin , metronidazole , and 1 L normal saline.  [facility] (Dr. Stacia) consulted via secure chat.  Review of Systems:  Review of Systems  All other systems reviewed and are negative.   Past Medical History:  Diagnosis Date   Abnormal CT of the chest 11/24/2013   See CT chest 11/22/13  1. No CT evidence of pulmonary arterial embolic disease.   2. Enlarged lymph nodes in the right hilar and subcarinal region.   These may represent reactive lymph nodes, clinical correlation   recommended. There is otherwise no evidence of mediastinal or   parenchymal masses or nodules nor infiltrates.   3. Atelectasis versus scarring within the lung bases as well as   interst   Acute encephalopathy 09/13/2023   Adenocarcinoma determined by biopsy of bile duct (HCC) 2025   Allergy    Altered mental status 09/13/2023   Benign neoplasm of colon 02/11/2012   Bunion 07/01/2010   Closed nondisplaced fracture of distal phalanx of right thumb 2020-08-11   Death of child 20-Sep-2016   Adult son truck driver died 87/86/82 in accident.     Depression 05/05/2007   External hemorrhoids without mention of complication 03/24/1999   GERD (gastroesophageal reflux disease) 08/02/1998   Hyperlipidemia 06/29/2006   Hypertensive emergency 09/13/2023   Memory loss 04/15/2003   Migraine without aura, without mention of intractable migraine without mention of status migrainosus 03/24/1999   Osteoporosis, unspecified 04/15/2004   Tear film insufficiency, unspecified 03/24/1999   Unspecified essential hypertension 11/22/2007    Past Surgical History:  Procedure Laterality Date   COLONOSCOPY  06-16-2007  Internal hemorrhoids,laxity of anal sphincter,polyp at 25cm form anal verge, tortuous sigmoid colon. Dr.Orr    ERCP N/A 12/21/2023   Procedure: ERCP, WITH  INTERVENTION IF INDICATED;  Surgeon: Wilhelmenia Aloha Raddle., MD;  Location: [facility] ENDOSCOPY;  Service: Gastroenterology;  Laterality: N/A;   ESOPHAGOGASTRODUODENOSCOPY N/A 12/21/2023   Procedure: EGD (ESOPHAGOGASTRODUODENOSCOPY);  Surgeon: Wilhelmenia Aloha Raddle., MD;  Location: [facility] ENDOSCOPY;  Service: Gastroenterology;  Laterality: N/A;   EUS N/A 12/21/2023   Procedure: ULTRASOUND, UPPER GI TRACT, ENDOSCOPIC;  Surgeon: Wilhelmenia Aloha Raddle., MD;  Location: [facility] ENDOSCOPY;  Service: Gastroenterology;  Laterality: N/A;   EYE SURGERY Bilateral 2010   cataract Dr. Camillo   FINE NEEDLE ASPIRATION  12/21/2023   Procedure: FINE NEEDLE ASPIRATION;  Surgeon: Wilhelmenia Aloha Raddle., MD;  Location: [facility] ENDOSCOPY;  Service: Gastroenterology;;   VAGINAL HYSTERECTOMY  1980     reports that she has never smoked. She has never used smokeless tobacco. She reports that she does not drink alcohol  and does not use drugs.  Allergies  Allergen Reactions   Aricept  [Donepezil ] Diarrhea   Vioxx [Rofecoxib] Nausea Only    Pt. Can't remember what reaction was but asks to leave on her profile.     Family History  Problem Relation Age of Onset   Alzheimer's disease Mother    Diabetes Mother    Transient ischemic attack Mother    Heart disease Mother        MI, CHF   Alcohol  abuse Father    Kidney disease Father    Hypertension Sister    Hypertension Sister    Hypertension Brother    Heart disease Brother    Hypertension Brother    Hypertension Brother    Other Son        truck accident 07/2016   Colon cancer Neg Hx    Stomach cancer Neg Hx    Esophageal cancer Neg Hx    Pancreatic cancer Neg Hx     Prior to Admission medications   Medication Sig Start Date End Date Taking? Authorizing Provider  capecitabine  (XELODA ) 500 MG tablet Take 2 tablets (1000mg ) by mouth twice daily within 30 mins of a meal. Take on Monday through Friday, during the days of radiation. 02/06/24  Yes Pasam, Avinash, MD  Ensure  (ENSURE) Take 237 mLs by mouth 2 (two) times daily between meals.   Yes [provider]  LORazepam  (ATIVAN ) 0.5 MG tablet Take 0.5 tablets (0.25 mg total) by mouth every 6 (six) hours as needed (nausea not relieved by Zofran  (Ondansetron )). 03/06/24  Yes Lanell Donald Stagger, PA-C  losartan  (COZAAR ) 100 MG tablet Take 1 tablet (100 mg total) by mouth daily. Essential hypertension I10 04/29/23  Yes [name], NP  ondansetron  (ZOFRAN ) 8 MG tablet Take 1 tablet (8 mg total) by mouth every 8 (eight) hours as needed for nausea or vomiting. 03/06/24  Yes Lanell Donald Stagger, PA-C  pantoprazole  (PROTONIX ) 40 MG tablet Take 40 mg by mouth daily as needed.   Yes [provider]  rosuvastatin  (CRESTOR ) 20 MG tablet Take 0.5 tablets (10 mg total) by mouth daily. Patient taking differently: Take 20 mg by mouth daily. 12/23/23  Yes Briana Elgin LABOR, MD  aspirin  EC 81 MG tablet Take 1 tablet (81 mg total) by mouth daily. Swallow whole. 10/31/20   [name], NP    Physical Exam: Vitals:   03/16/24 1949 03/16/24 2201 03/17/24 0023  BP: (!) 163/68 (!) 154/70 (!) 156/84  Pulse: 70 74 73  Resp: 16 13 16   Temp: 98.2 F (36.8 C) 97.8 F (36.6 C) (!) 97.4 F (36.3 C)  TempSrc:  Oral Oral  SpO2: 100% 95% 100%    Physical Exam Vitals reviewed.  Constitutional:      General: She is not in acute distress. Eyes:     Extraocular Movements: Extraocular movements intact.  Cardiovascular:     Rate and Rhythm: Normal rate and regular rhythm.     Heart sounds: Normal heart sounds.  Pulmonary:     Effort: Pulmonary effort is normal. No respiratory distress.     Breath sounds: Normal breath sounds. No wheezing, rhonchi or rales.  Abdominal:     General: Bowel sounds are normal. There is no distension.     Palpations: Abdomen is soft.     Tenderness: There is no abdominal tenderness. There is no guarding.  Musculoskeletal:     Cervical back: Normal range of motion.     Right  lower leg: No edema.     Left lower leg: No edema.  Skin:    General: Skin is warm and dry.  Neurological:     General: No focal deficit present.     Mental Status: She is alert and oriented to person, place, and time.     Labs on Admission: I have personally reviewed following labs and imaging studies  CBC: Recent Labs  Lab 03/16/24 2007  WBC 6.7  NEUTROABS 5.4  HGB 10.6*  HCT 31.4*  MCV 93.7  PLT 189   Basic Metabolic Panel: Recent Labs  Lab 03/16/24 2007  NA 136  K 3.9  CL 103  CO2 22  GLUCOSE 147*  BUN 17  CREATININE 0.95  CALCIUM  9.1   GFR: CrCl cannot be calculated (Unknown ideal weight.). Liver Function Tests: Recent Labs  Lab 03/16/24 2007  AST 24  ALT 13  ALKPHOS 39  BILITOT 0.6  PROT 6.3*  ALBUMIN 3.6   Recent Labs  Lab 03/16/24 2007  LIPASE 27   No results for input(s): AMMONIA in the last 168 hours. Coagulation Profile: No results for input(s): INR, PROTIME in the last 168 hours. Cardiac Enzymes: No results for input(s): CKTOTAL, CKMB, CKMBINDEX, TROPONINI in the last 168 hours. BNP (last 3 results) No results for input(s): PROBNP in the last 8760 hours. HbA1C: No results for input(s): HGBA1C in the last 72 hours. CBG: No results for input(s): GLUCAP in the last 168 hours. Lipid Profile: No results for input(s): CHOL, HDL, LDLCALC, TRIG, CHOLHDL, LDLDIRECT in the last 72 hours. Thyroid  Function Tests: No results for input(s): TSH, T4TOTAL, FREET4, T3FREE, THYROIDAB in the last 72 hours. Anemia Panel: No results for input(s): VITAMINB12, FOLATE, FERRITIN, TIBC, IRON, RETICCTPCT in the last 72 hours. Urine analysis:    Component Value Date/Time   COLORURINE STRAW (A) 12/18/2023 1350   APPEARANCEUR CLEAR 12/18/2023 1350   LABSPEC 1.003 (L) 12/18/2023 1350   PHURINE 6.0 12/18/2023 1350   GLUCOSEU NEGATIVE 12/18/2023 1350   HGBUR NEGATIVE 12/18/2023 1350   BILIRUBINUR NEGATIVE  12/18/2023 1350   BILIRUBINUR Negative 07/30/2022 1102   KETONESUR NEGATIVE 12/18/2023 1350   PROTEINUR NEGATIVE 12/18/2023 1350   UROBILINOGEN negative (A) 07/30/2022 1102   UROBILINOGEN 0.2 11/22/2013 1731   NITRITE NEGATIVE 12/18/2023 1350   LEUKOCYTESUR NEGATIVE 12/18/2023 1350    Radiological Exams on Admission: CT ABDOMEN PELVIS W CONTRAST Result Date: 03/16/2024 CLINICAL DATA:  Nausea vomiting diarrhea history of bile duct cancer EXAM: CT ABDOMEN AND PELVIS WITH CONTRAST TECHNIQUE: Multidetector  CT imaging of the abdomen and pelvis was performed using the standard protocol following bolus administration of intravenous contrast. RADIATION DOSE REDUCTION: This exam was performed according to the departmental dose-optimization program which includes automated exposure control, adjustment of the mA and/or kV according to patient size and/or use of iterative reconstruction technique. CONTRAST:  OMNIPAQUE  IOHEXOL  300 MG/ML  SOLN COMPARISON:  CT 12/29/2023, MRI 12/18/2023 FINDINGS: Lower chest: Lung bases demonstrate no acute airspace disease. Hepatobiliary: Hepatic steatosis. Pneumobilia. Common bile duct stent remains in place. No biliary distension. Decompressed gallbladder with small stone at the fundus. Indistinct hazy density surrounding the bile duct stent at the porta hepatis. Narrowed appearance of the portal vein at the porta hepatis by indistinct surrounding soft tissue, series 2, image 24, coronal series 8, image 41, and presumably related to the known history of bowel duct malignancy. Pancreas: No ductal dilatation.  No definite inflammatory change. Spleen: Normal in size without focal abnormality. Adrenals/Urinary Tract: Adrenal glands are normal. Kidneys show no hydronephrosis. The bladder is unremarkable Stomach/Bowel: The stomach is decompressed. No dilated small bowel. Areas of wall thickening and pericolonic stranding, involves the cecum, ascending colon, hepatic flexure and  transverse colon. Similar changes at the sigmoid colon. Negative appendix. Diverticular disease. Some fluid-filled small bowel in the right lower quadrant, mucosal enhancement of the terminal ileum and pelvic small bowel loops. Vascular/Lymphatic: Aortic atherosclerosis. No aneurysm. Small porta hepatis lymph nodes measuring up to 8 mm. As stated previously, focal narrowed appearance of the portal vein but without thrombus or occlusion. Reproductive: Status post hysterectomy. No adnexal masses. Other: Negative for pelvic effusion or free air. Musculoskeletal: No acute or suspicious osseous abnormality. IMPRESSION: IMPRESSION 1. Colon wall thickening and pericolonic edema extends from the cecum through the transverse colon and also involving the sigmoid colon consistent with colitis of infectious, inflammatory or ischemic etiology. Fluid-filled small bowel in the right lower quadrant with mild mucosal enhancement of terminal ileum consistent with ileitis. 2. Common bile duct stent remains in place with pneumobilia. No biliary distension. Indistinct hazy soft tissue density at the porta hepatis, surrounding common bile duct stent and now with interval focal narrowed appearance of the portal vein, and presumably elated to the history of known bile duct malignancy. No evidence for portal vein thrombus or occlusion on this exam. 3. Hepatic steatosis. 4. Aortic atherosclerosis. Aortic Atherosclerosis (ICD10-I70.0). Electronically Signed   By: Luke Bun M.D.   On: 03/16/2024 22:01    Assessment and Plan  Acute colitis/ileitis No fever, tachycardia, or leukocytosis to suggest sepsis.  Abdominal exam benign.  Continue antibiotic coverage with ceftriaxone  and metronidazole .  Continue IV fluid hydration, antiemetic as needed (EKG ordered to check QT interval), monitor electrolytes.  C. difficile PCR and GI pathogen panel ordered although it seems diarrhea stopped yesterday.  Check lactate level and trend WBC count.   [facility] consulted.  Vascular dementia Delirium precautions.  Primary cholangiocarcinoma of extrahepatic bile duct Currently being treated with chemo and radiation.  Outpatient oncology follow-up.  Hyperlipidemia Continue Crestor .  Hypertension Holding losartan  at this time due to risk of AKI.  IV hydralazine  PRN SBP >160.  DVT prophylaxis: SCDs Code Status: DNR/DNI (discussed with the patient and her son) Family Communication: Son at bedside. Consults called: [facility] Level of care: Telemetry bed Admission status: It is my clinical opinion that admission to INPATIENT is reasonable and necessary because of the expectation that this patient will require hospital care that crosses at least 2 midnights to treat this  condition based on the medical complexity of the problems presented.  Given the aforementioned information, the predictability of an adverse outcome is felt to be significant.  Editha Ram MD [facility]  If 7PM-7AM, please contact night-coverage [email or web address]  03/17/2024, 12:52 AM

## 2024-03-17 NOTE — Plan of Care (Signed)
  Problem: Education: Goal: Knowledge of General Education information will improve Description: Including pain rating scale, medication(s)/side effects and non-pharmacologic comfort measures Outcome: Progressing   Problem: Clinical Measurements: Goal: Ability to maintain clinical measurements within normal limits will improve Outcome: Progressing Goal: Diagnostic test results will improve Outcome: Progressing Goal: Respiratory complications will improve Outcome: Progressing Goal: Cardiovascular complication will be avoided Outcome: Progressing   Problem: Activity: Goal: Risk for activity intolerance will decrease Outcome: Progressing   Problem: Health Behavior/Discharge Planning: Goal: Ability to manage health-related needs will improve Outcome: Not Progressing   Problem: Clinical Measurements: Goal: Will remain free from infection Outcome: Not Progressing

## 2024-03-17 NOTE — Progress Notes (Signed)
 PROGRESS NOTE    Caroline Snyder  FMW:[id number] DOB: 05-25-1941 DOA: 03/16/2024 PCP: [name], NP    Chief Complaint  Patient presents with   Abdominal Pain    Brief Narrative:  Patient 83 year old female history of vascular dementia, primary cholangiocarcinoma over the extrahepatic bile duct currently on chemo and radiation, depression, GERD, hyperlipidemia, hypertension presented to the ED with nausea vomiting diarrhea.  CT abdomen and pelvis done concerning for colitis.  Stool studies ordered and pending.  Patient placed empirically on IV antibiotics.  GI consulted for further evaluation and management.   Assessment & Plan:   Principal Problem:   Colitis Active Problems:   Hyperlipidemia   Essential hypertension   Nausea and vomiting   Vascular dementia (HCC)   Cholangiocarcinoma (HCC)   Anemia   Pressure injury of skin  #1 acute colitis/ileitis -Patient presented with nausea and vomiting diarrhea. - Differential includes infectious versus inflammatory versus ischemic colitis. - CT abdomen and pelvis obtained with colonic wall thickening and pericolonic edema extending from the cecum through the transverse colon and also involving the sigmoid colon consistent with colitis of infectious, inflammatory or ischemic etiology.  Fluid-filled small bowel in the right lower quadrant with mild mucosal enhancement of terminal ileum consistent with ileitis.  Common bile duct stent in place with pneumobilia.  No biliary distention.  Indistinct hazy soft tissue density at the porta hepatis, surrounding common bile duct stent and now with interval focal narrowed appearance of the portal vein and presumably related to history of known bile duct malignancy.  No evidence of portal vein thrombosis or occlusion.  Hepatic steatosis.  Aortic atherosclerosis. - GI pathogen panel by PCR pending - C. difficile by PCR pending. - Patient afebrile. - Continue empiric antibiotics of IV  Rocephin , IV Flagyl . - IV antiemetics, IV fluids, supportive care. - Patient seen in consultation by GI who are in agreement with current antibiotics and awaiting stool studies. - GI also recommending holding capecitabine  until symptoms are resolved and may need to consider dose reduction.  2.  Cholangiocarcinoma -Patient currently undergoing chemotherapy and radiation treatment. - Hold capecitabine  until resolution of problem #1 per GI recommendations. - Biliary stent noted to be in place on CT abdomen and pelvis. - Per GI plans for outpatient stent exchange after completion of radiation treatment. - Outpatient follow-up with oncology.  3.  Anemia -Felt likely secondary to hemodilution and chemotherapy. - Patient with no overt bleeding. - Check anemia panel. - Follow H&H. - Transfusion threshold hemoglobin < 7.  4.  Vascular dementia -Delirium precautions.  5.  Hyperlipidemia -Will hold Crestor , and resume on discharge.   6.  Hypertension -Continue to hold home regimen ARB. - IV hydralazine  as needed.   DVT prophylaxis: SCDs Code Status: DNR Family Communication: Updated patient.  No family at bedside. Disposition: TBD  Status is: Inpatient Remains inpatient appropriate because: Severity of illness   Consultants:  Gastroenterology: Dr. Stacia 03/17/2024  Procedures:  CT abdomen and pelvis 03/16/2024   Antimicrobials:  Anti-infectives (From admission, onward)    Start     Dose/Rate Route Frequency Ordered Stop   03/17/24 1000  cefTRIAXone  (ROCEPHIN ) 2 g in sodium chloride  0.9 % 100 mL IVPB        2 g 200 mL/hr over 30 Minutes Intravenous Every 24 hours 03/17/24 0105     03/17/24 1000  metroNIDAZOLE  (FLAGYL ) IVPB 500 mg        500 mg 100 mL/hr over 60 Minutes Intravenous Every 12  hours 03/17/24 0105     03/16/24 2245  ciprofloxacin  (CIPRO ) IVPB 400 mg        400 mg 200 mL/hr over 60 Minutes Intravenous  Once 03/16/24 2244 03/17/24 0003   03/16/24 2245   metroNIDAZOLE  (FLAGYL ) IVPB 500 mg        500 mg 100 mL/hr over 60 Minutes Intravenous  Once 03/16/24 2244 03/17/24 0150         Subjective: Patient sleeping but arousable.  Denies any chest pain or shortness of breath.  Does endorse some nausea but no emesis.  States some improvement with abdominal pain.  Still having some loose stools.  Tolerating current diet.  Objective: Vitals:   03/17/24 0023 03/17/24 0136 03/17/24 0420 03/17/24 0743  BP: (!) 156/84  (!) 146/66 139/70  Pulse: 73  81 77  Resp: 16   20  Temp: (!) 97.4 F (36.3 C)  97.6 F (36.4 C) 97.8 F (36.6 C)  TempSrc: Oral  Oral Oral  SpO2: 100%  99% 99%  Weight:  44.4 kg    Height:  5' (1.524 m)      Intake/Output Summary (Last 24 hours) at 03/17/2024 1301 Last data filed at 03/17/2024 9166 Gross per 24 hour  Intake 427.08 ml  Output 10 ml  Net 417.08 ml   Filed Weights   03/17/24 0136  Weight: 44.4 kg    Examination:  General exam: Appears calm and comfortable.  Dry mucous membranes. Respiratory system: Clear to auscultation. Respiratory effort normal. Cardiovascular system: S1 & S2 heard, RRR. No JVD, murmurs, rubs, gallops or clicks. No pedal edema. Gastrointestinal system: Abdomen is nondistended, soft and some diffuse tenderness to palpation. No organomegaly or masses felt. Normal bowel sounds heard. Central nervous system: Alert and oriented. No focal neurological deficits. Extremities: Symmetric 5 x 5 power. Skin: No rashes, lesions or ulcers Psychiatry: Judgement and insight appear normal. Mood & affect appropriate.     Data Reviewed: I have personally reviewed following labs and imaging studies  CBC: Recent Labs  Lab 03/16/24 2007 03/17/24 0125  WBC 6.7 5.2  NEUTROABS 5.4  --   HGB 10.6* 9.3*  HCT 31.4* 28.6*  MCV 93.7 96.0  PLT 189 136*    Basic Metabolic Panel: Recent Labs  Lab 03/16/24 2007 03/17/24 0125  NA 136 135  K 3.9 4.1  CL 103 105  CO2 22 22  GLUCOSE 147* 127*   BUN 17 14  CREATININE 0.95 0.84  CALCIUM  9.1 8.2*  MG  --  1.8    GFR: Estimated Creatinine Clearance: 34.9 mL/min (by C-G formula based on SCr of 0.84 mg/dL).  Liver Function Tests: Recent Labs  Lab 03/16/24 2007  AST 24  ALT 13  ALKPHOS 39  BILITOT 0.6  PROT 6.3*  ALBUMIN 3.6    CBG: No results for input(s): GLUCAP in the last 168 hours.   No results found for this or any previous visit (from the past 240 hours).       Radiology Studies: CT ABDOMEN PELVIS W CONTRAST Result Date: 03/16/2024 CLINICAL DATA:  Nausea vomiting diarrhea history of bile duct cancer EXAM: CT ABDOMEN AND PELVIS WITH CONTRAST TECHNIQUE: Multidetector CT imaging of the abdomen and pelvis was performed using the standard protocol following bolus administration of intravenous contrast. RADIATION DOSE REDUCTION: This exam was performed according to the departmental dose-optimization program which includes automated exposure control, adjustment of the mA and/or kV according to patient size and/or use of iterative reconstruction technique. CONTRAST:  OMNIPAQUE  IOHEXOL  300 MG/ML  SOLN COMPARISON:  CT 12/29/2023, MRI 12/18/2023 FINDINGS: Lower chest: Lung bases demonstrate no acute airspace disease. Hepatobiliary: Hepatic steatosis. Pneumobilia. Common bile duct stent remains in place. No biliary distension. Decompressed gallbladder with small stone at the fundus. Indistinct hazy density surrounding the bile duct stent at the porta hepatis. Narrowed appearance of the portal vein at the porta hepatis by indistinct surrounding soft tissue, series 2, image 24, coronal series 8, image 41, and presumably related to the known history of bowel duct malignancy. Pancreas: No ductal dilatation.  No definite inflammatory change. Spleen: Normal in size without focal abnormality. Adrenals/Urinary Tract: Adrenal glands are normal. Kidneys show no hydronephrosis. The bladder is unremarkable Stomach/Bowel: The stomach is  decompressed. No dilated small bowel. Areas of wall thickening and pericolonic stranding, involves the cecum, ascending colon, hepatic flexure and transverse colon. Similar changes at the sigmoid colon. Negative appendix. Diverticular disease. Some fluid-filled small bowel in the right lower quadrant, mucosal enhancement of the terminal ileum and pelvic small bowel loops. Vascular/Lymphatic: Aortic atherosclerosis. No aneurysm. Small porta hepatis lymph nodes measuring up to 8 mm. As stated previously, focal narrowed appearance of the portal vein but without thrombus or occlusion. Reproductive: Status post hysterectomy. No adnexal masses. Other: Negative for pelvic effusion or free air. Musculoskeletal: No acute or suspicious osseous abnormality. IMPRESSION: IMPRESSION 1. Colon wall thickening and pericolonic edema extends from the cecum through the transverse colon and also involving the sigmoid colon consistent with colitis of infectious, inflammatory or ischemic etiology. Fluid-filled small bowel in the right lower quadrant with mild mucosal enhancement of terminal ileum consistent with ileitis. 2. Common bile duct stent remains in place with pneumobilia. No biliary distension. Indistinct hazy soft tissue density at the porta hepatis, surrounding common bile duct stent and now with interval focal narrowed appearance of the portal vein, and presumably elated to the history of known bile duct malignancy. No evidence for portal vein thrombus or occlusion on this exam. 3. Hepatic steatosis. 4. Aortic atherosclerosis. Aortic Atherosclerosis (ICD10-I70.0). Electronically Signed   By: Luke Bun M.D.   On: 03/16/2024 22:01        Scheduled Meds:  aspirin  EC  81 mg Oral Daily   feeding supplement  237 mL Oral BID BM   rosuvastatin   20 mg Oral Daily   Continuous Infusions:  sodium chloride  125 mL/hr at 03/17/24 1016   cefTRIAXone  (ROCEPHIN )  IV 2 g (03/17/24 1137)   metronidazole  500 mg (03/17/24 1002)      LOS: 1 day    Time spent: 40 minutes    Toribio Hummer, MD [facility]   To contact the attending provider between 7A-7P or the covering provider during after hours 7P-7A, please log into the web site [email or web address] and access using universal [place] password for that web site. If you do not have the password, please call the hospital operator.  03/17/2024, 1:01 PM

## 2024-03-17 NOTE — Consult Note (Signed)
 Consultation  Referring Provider:     [facility] hospitalist Primary Care Physician:  [name], NP Primary Gastroenterologist:    Dr. Leigh     Reason for Consultation:     Colitis         HPI:   Caroline Snyder is a 83 y.o. female with a history of dementia, recently diagnosed cholangiocarcinoma, currently under treatment with chemotherapy radiation, admitted with nausea, vomiting and diarrhea.  The patient is not a great historian, but it seems that she has been having a few weeks of nausea, vomiting and poor appetite.  She reports having awful diarrhea which she thinks lasted 2 days.  She does not think she has had a bowel movement in the past 2 days.  She does not think there is any blood in the stool.  She denies any abdominal pain.  She denies any fevers or chills. No history of chronic diarrhea.  Rather she tends to have more issues with constipation.  She was seen in our office in 2020 with 3 weeks of persistent diarrhea, which resolved spontaneously.  C. difficile testing was negative at that time.  On presentation in the ED, she was hemodynamically stable, with unremarkable labs.  No neutropenia. White blood cell count 6.7, up from 2.7 August 8, hemoglobin 10.6, platelets 189  CMP also reassuring, with normal electrolytes and creatinine at baseline.  CT of the abdomen pelvis showed colon wall thickening and pericolonic edema from the cecum through transverse colon, also involving sigmoid colon.  Fluid-filled small bowel in the right lower quadrant with mild mucosal enhancement of terminal ileum consistent with ileitis.  She started radiation therapy with Dr. Dewey on July 14, and started chemotherapy with capecitabine  twice daily Monday through Friday.     MRCP 12/19/23: MPRESSION: 1. The gallbladder is now decompressed. No signs of choledocholithiasis. 2. Moderate to severe intrahepatic bile duct dilatation. There is abrupt beak like narrowing of the mid common  bile duct at the level of the head of pancreas. Findings are concerning for a stricture at the level of the head of pancreas. 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 3. Mild diffuse edema within the pancreatic parenchyma noted, which is concerning for pancreatitis. No signs of pancreatic necrosis or pseudocyst formation. 4. Mild increase caliber of the main pancreatic duct which measures up to 4 mm at the level of the pancreatic neck, which abruptly decreases in caliber at the head of pancreas. Focal area of mild increased T2 signal with restricted diffusion in the anterior head of pancreas measures 2.2 x 1.2 by 2.2 cm. Cannot exclude pancreatic head neoplasm. Consider further evaluation with endoscopic ultrasound and tissue sampling. 5. Aortic Atherosclerosis (ICD10-I70.0).     EGD / EUS 12/21/23: EGD Impression: - No gross lesions in the entire esophagus. Z-line regular, 40 cm from the incisors. - Gastritis with hemorrhage. Biopsied. - No gross lesions in the duodenal bulb, in the first portion of the duodenum and in the second portion of the duodenum. - Normal major papilla.    EUS Impression: - A mass was found within the middle third of the main bile duct. Cytology results are pending. However, the endosonographic appearance is highly suspicious for adenocarcinoma (extrahepatic cholangiocarcinoma). Fine needle biopsy performed. - Pancreatic parenchymal abnormalities consisting of lobularity and hyperechoic strands were noted in the entire pancreas. No overt mass noted within the pancreas. - Main pancreatic duct (  MPD) diameter was measured. Endosonographically, the MPD had a normal appearance otherwise. - No malignant-appearing lymph nodes were visualized in the celiac region (level 20), peripancreatic region and porta hepatis region.   ERCP 12/21/23: - The major papilla  appeared normal. - Extremely difficult cannulation requiring attempt at double-wire, over pancreatic stent, precut fistulotomy to eventually gain biliary access. - A single severe biliary stricture was found in the middle third of the main bile duct. The stricture was malignant appearing. - The upper third of the main bile duct, left main hepatic duct and right main hepatic duct were moderately dilated, secondary to a stricture. The stricture was brushed for cytology. - One temporary plastic biliary stent was placed into the ventral pancreatic duct to decrease PEP and aid in cannulation. - One plastic biliary stent was placed into the common bile duct to traverse the stricture.   - Repeat ERCP in 4 months to exchange stent to permanent stent pending overall clinical status.     FINAL MICROSCOPIC DIAGNOSIS:   A. 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  -  An immunohistochemical stain for Helicobacter pylori organisms is  positive.    FINAL MICROSCOPIC DIAGNOSIS:  A. COMMON BILE DUCT, NODULE FINE NEEDLE ASPIRATION:  - Malignant  - Adenocarcinoma    FINAL MICROSCOPIC DIAGNOSIS:  - Malignant cells present  - Adenocarcinoma      CT chest 12/22/23: IMPRESSION: 1. No convincing evidence for metastatic disease in the chest. 2. Several very tiny scattered bilateral pulmonary nodules, likely benign. However, given patient history, surveillance warranted. 3. Pneumobilia in the incompletely visualized liver, presumably secondary to biliary stent placement on 12/21/2023. 4.  Aortic Atherosclerosis (ICD10-I70.0).     CT abd / pelvis 12/29/23: IMPRESSION: Internal common bile duct stent in place, with resolution of diffuse biliary ductal dilatation since previous study.   Soft tissue thickening involving the proximal common bile duct surrounding stent, consistent with known bile  duct adenocarcinoma/cholangiocarcinoma.   No evidence of metastatic disease.   Mild hepatic steatosis.   Colonic diverticulosis, without radiographic evidence of diverticulitis.   Large colonic stool burden noted; recommend clinical correlation for possible constipation.  Past Medical History:  Diagnosis Date   Abnormal CT of the chest 11/24/2013   See CT chest 11/22/13  1. No CT evidence of pulmonary arterial embolic disease.   2. Enlarged lymph nodes in the right hilar and subcarinal region.   These may represent reactive lymph nodes, clinical correlation   recommended. There is otherwise no evidence of mediastinal or   parenchymal masses or nodules nor infiltrates.   3. Atelectasis versus scarring within the lung bases as well as   interst   Acute encephalopathy 09/13/2023   Adenocarcinoma determined by biopsy of bile duct (HCC) 2025   Allergy    Altered mental status 09/13/2023   Benign neoplasm of colon 02/11/2012   Bunion 07/01/2010   Closed nondisplaced fracture of distal phalanx of right thumb July 31, 2020   Death of child 09/09/2016   Adult son truck driver died 87/86/82 in accident.     Depression 05/05/2007   External hemorrhoids without mention of complication 03/24/1999   GERD (gastroesophageal reflux disease) 08/02/1998   Hyperlipidemia 06/29/2006   Hypertensive emergency 09/13/2023   Memory loss 04/15/2003   Migraine without aura, without mention of intractable migraine without mention of status migrainosus 03/24/1999   Osteoporosis, unspecified 04/15/2004   Tear film insufficiency, unspecified 03/24/1999  Unspecified essential hypertension 11/22/2007    Past Surgical History:  Procedure Laterality Date   COLONOSCOPY  06-16-2007   Internal hemorrhoids,laxity of anal sphincter,polyp at 25cm form anal verge, tortuous sigmoid colon. Dr.Orr    ERCP N/A 12/21/2023   Procedure: ERCP, WITH INTERVENTION IF INDICATED;  Surgeon: Wilhelmenia Aloha Raddle., MD;  Location: [facility]  ENDOSCOPY;  Service: Gastroenterology;  Laterality: N/A;   ESOPHAGOGASTRODUODENOSCOPY N/A 12/21/2023   Procedure: EGD (ESOPHAGOGASTRODUODENOSCOPY);  Surgeon: Wilhelmenia Aloha Raddle., MD;  Location: [facility] ENDOSCOPY;  Service: Gastroenterology;  Laterality: N/A;   EUS N/A 12/21/2023   Procedure: ULTRASOUND, UPPER GI TRACT, ENDOSCOPIC;  Surgeon: Wilhelmenia Aloha Raddle., MD;  Location: [facility] ENDOSCOPY;  Service: Gastroenterology;  Laterality: N/A;   EYE SURGERY Bilateral 2010   cataract Dr. Camillo   FINE NEEDLE ASPIRATION  12/21/2023   Procedure: FINE NEEDLE ASPIRATION;  Surgeon: Wilhelmenia Aloha Raddle., MD;  Location: [facility] ENDOSCOPY;  Service: Gastroenterology;;   VAGINAL HYSTERECTOMY  1980    Family History  Problem Relation Age of Onset   Alzheimer's disease Mother    Diabetes Mother    Transient ischemic attack Mother    Heart disease Mother        MI, CHF   Alcohol  abuse Father    Kidney disease Father    Hypertension Sister    Hypertension Sister    Hypertension Brother    Heart disease Brother    Hypertension Brother    Hypertension Brother    Other Son        truck accident 07/2016   Colon cancer Neg Hx    Stomach cancer Neg Hx    Esophageal cancer Neg Hx    Pancreatic cancer Neg Hx      Social History   Tobacco Use   Smoking status: Never   Smokeless tobacco: Never  Vaping Use   Vaping status: Never Used  Substance Use Topics   Alcohol  use: No    Alcohol /week: 0.0 standard drinks of alcohol    Drug use: No    Prior to Admission medications   Medication Sig Start Date End Date Taking? Authorizing Provider  capecitabine  (XELODA ) 500 MG tablet Take 2 tablets (1000mg ) by mouth twice daily within 30 mins of a meal. Take on Monday through Friday, during the days of radiation. 02/06/24  Yes Pasam, Avinash, MD  Ensure (ENSURE) Take 237 mLs by mouth 2 (two) times daily between meals.   Yes [provider]  LORazepam  (ATIVAN ) 0.5 MG tablet Take 0.5 tablets (0.25 mg total) by  mouth every 6 (six) hours as needed (nausea not relieved by Zofran  (Ondansetron )). 03/06/24  Yes Lanell Donald Stagger, PA-C  losartan  (COZAAR ) 100 MG tablet Take 1 tablet (100 mg total) by mouth daily. Essential hypertension I10 04/29/23  Yes [name], NP  ondansetron  (ZOFRAN ) 8 MG tablet Take 1 tablet (8 mg total) by mouth every 8 (eight) hours as needed for nausea or vomiting. 03/06/24  Yes Lanell Donald Stagger, PA-C  pantoprazole  (PROTONIX ) 40 MG tablet Take 40 mg by mouth daily as needed.   Yes [provider]  rosuvastatin  (CRESTOR ) 20 MG tablet Take 0.5 tablets (10 mg total) by mouth daily. Patient taking differently: Take 20 mg by mouth daily. 12/23/23  Yes Briana Elgin LABOR, MD  aspirin  EC 81 MG tablet Take 1 tablet (81 mg total) by mouth daily. Swallow whole. 10/31/20   [name], NP    Current Facility-Administered Medications  Medication Dose Route Frequency Provider Last Rate Last Admin  0.9 %  sodium chloride  infusion   Intravenous Continuous Alfornia Madison, MD 125 mL/hr at 03/17/24 0428 Infusion Verify at 03/17/24 9571   acetaminophen  (TYLENOL ) tablet 650 mg  650 mg Oral Q6H PRN Alfornia Madison, MD   650 mg at 03/17/24 9573   Or   acetaminophen  (TYLENOL ) suppository 650 mg  650 mg Rectal Q6H PRN Alfornia Madison, MD       cefTRIAXone  (ROCEPHIN ) 2 g in sodium chloride  0.9 % 100 mL IVPB  2 g Intravenous Q24H Alfornia Madison, MD       hydrALAZINE  (APRESOLINE ) injection 5 mg  5 mg Intravenous Q6H PRN Alfornia Madison, MD       metroNIDAZOLE  (FLAGYL ) IVPB 500 mg  500 mg Intravenous Q12H Alfornia Madison, MD       ondansetron  (ZOFRAN ) injection 4 mg  4 mg Intravenous Q6H PRN Alfornia Madison, MD       rosuvastatin  (CRESTOR ) tablet 20 mg  20 mg Oral Daily Alfornia Madison, MD        Allergies as of 03/16/2024 - Review Complete 03/16/2024  Allergen Reaction Noted   Aricept  [donepezil ] Diarrhea 07/21/2022   Vioxx [rofecoxib] Nausea Only  08/16/2001     Review of Systems:    As per HPI, otherwise negative    Physical Exam:  Vital signs in last 24 hours: Temp:  [97.4 F (36.3 C)-98.2 F (36.8 C)] 97.8 F (36.6 C) (08/16 0743) Pulse Rate:  [70-81] 77 (08/16 0743) Resp:  [13-20] 20 (08/16 0743) BP: (139-163)/(66-84) 139/70 (08/16 0743) SpO2:  [95 %-100 %] 99 % (08/16 0743) Weight:  [44.4 kg] 44.4 kg (08/16 0136) Last BM Date : 03/16/24 General:   Pleasant thin, frail Caucasian female in NAD.  No family at bedside this morning Head:  Normocephalic and atraumatic. Eyes:   No icterus.   Conjunctiva pink. Ears:  Normal auditory acuity. Neck:  Supple Lungs:  Respirations even and unlabored. Lungs clear to auscultation bilaterally.   No wheezes, crackles, or rhonchi.  Heart:  Regular rate and rhythm; no MRG Abdomen:  Soft, nondistended, nontender. Normal bowel sounds. No appreciable masses or hepatomegaly.  Rectal:  Not performed.  Msk:  Symmetrical without gross deformities.  Extremities:  Without edema. Neurologic:  Alert and  oriented x4;  grossly normal neurologically. Skin:  Intact without significant lesions or rashes. Psych:  Alert and cooperative. Normal affect.  LAB RESULTS: Recent Labs    03/16/24 2007 03/17/24 0125  WBC 6.7 5.2  HGB 10.6* 9.3*  HCT 31.4* 28.6*  PLT 189 136*   BMET Recent Labs    03/16/24 2007 03/17/24 0125  NA 136 135  K 3.9 4.1  CL 103 105  CO2 22 22  GLUCOSE 147* 127*  BUN 17 14  CREATININE 0.95 0.84  CALCIUM  9.1 8.2*   LFT Recent Labs    03/16/24 2007  PROT 6.3*  ALBUMIN 3.6  AST 24  ALT 13  ALKPHOS 39  BILITOT 0.6   PT/INR No results for input(s): LABPROT, INR in the last 72 hours.  STUDIES: CT ABDOMEN PELVIS W CONTRAST Result Date: 03/16/2024 CLINICAL DATA:  Nausea vomiting diarrhea history of bile duct cancer EXAM: CT ABDOMEN AND PELVIS WITH CONTRAST TECHNIQUE: Multidetector CT imaging of the abdomen and pelvis was performed using the standard  protocol following bolus administration of intravenous contrast. RADIATION DOSE REDUCTION: This exam was performed according to the departmental dose-optimization program which includes automated exposure control, adjustment of the mA and/or kV according to patient size and/or use  of iterative reconstruction technique. CONTRAST:  OMNIPAQUE  IOHEXOL  300 MG/ML  SOLN COMPARISON:  CT 12/29/2023, MRI 12/18/2023 FINDINGS: Lower chest: Lung bases demonstrate no acute airspace disease. Hepatobiliary: Hepatic steatosis. Pneumobilia. Common bile duct stent remains in place. No biliary distension. Decompressed gallbladder with small stone at the fundus. Indistinct hazy density surrounding the bile duct stent at the porta hepatis. Narrowed appearance of the portal vein at the porta hepatis by indistinct surrounding soft tissue, series 2, image 24, coronal series 8, image 41, and presumably related to the known history of bowel duct malignancy. Pancreas: No ductal dilatation.  No definite inflammatory change. Spleen: Normal in size without focal abnormality. Adrenals/Urinary Tract: Adrenal glands are normal. Kidneys show no hydronephrosis. The bladder is unremarkable Stomach/Bowel: The stomach is decompressed. No dilated small bowel. Areas of wall thickening and pericolonic stranding, involves the cecum, ascending colon, hepatic flexure and transverse colon. Similar changes at the sigmoid colon. Negative appendix. Diverticular disease. Some fluid-filled small bowel in the right lower quadrant, mucosal enhancement of the terminal ileum and pelvic small bowel loops. Vascular/Lymphatic: Aortic atherosclerosis. No aneurysm. Small porta hepatis lymph nodes measuring up to 8 mm. As stated previously, focal narrowed appearance of the portal vein but without thrombus or occlusion. Reproductive: Status post hysterectomy. No adnexal masses. Other: Negative for pelvic effusion or free air. Musculoskeletal: No acute or suspicious  osseous abnormality. IMPRESSION: IMPRESSION 1. Colon wall thickening and pericolonic edema extends from the cecum through the transverse colon and also involving the sigmoid colon consistent with colitis of infectious, inflammatory or ischemic etiology. Fluid-filled small bowel in the right lower quadrant with mild mucosal enhancement of terminal ileum consistent with ileitis. 2. Common bile duct stent remains in place with pneumobilia. No biliary distension. Indistinct hazy soft tissue density at the porta hepatis, surrounding common bile duct stent and now with interval focal narrowed appearance of the portal vein, and presumably elated to the history of known bile duct malignancy. No evidence for portal vein thrombus or occlusion on this exam. 3. Hepatic steatosis. 4. Aortic atherosclerosis. Aortic Atherosclerosis (ICD10-I70.0). Electronically Signed   By: Luke Bun M.D.   On: 03/16/2024 22:01     PREVIOUS ENDOSCOPIES:            Colonoscopy 2018 Small tubular adenoma Sigmoid diverticulosis      Impression / Plan:   83 year old female with recently diagnosed cholangiocarcinoma, currently undergoing treatment with radiation and Xeloda , admitted with 2 to 3 weeks of nausea, vomiting and poor p.o. intake, also with 2 days of nonbloody diarrhea, with CT findings of ileocolitis. Clinical presentation most consistent with either infectious ileocolitis, versus capecitabine  induced colitis.  Acute colitis, nausea/vomiting - Agree with empiric antibiotics for now - Await stools to rule out infectious colitis - Would hold capecitabine  until symptoms resolve; consider dose reduction, defer to oncology - Continue supportive care with IV hydration, antiemetics - Advance diet to clear liquid diet, advance as tolerated - No plans for endoscopic evaluation at this time.  Cholangiocarcinoma - Currently undergoing treatment as above - Hold capecitabine  as above - Biliary stent in place, with good  bile drainage - Plans for outpatient stent exchange following completion of radiation  Anemia - Likely related to chemotherapy, hemodilution - No evidence of GI bleeding - No plans for endoscopic evaluation  Thanks   LOS: 1 day   Glendia FORBES Holt  03/17/2024, 8:29 AM

## 2024-03-17 NOTE — Plan of Care (Signed)
  Problem: Education: Goal: Knowledge of General Education information will improve Description: Including pain rating scale, medication(s)/side effects and non-pharmacologic comfort measures Outcome: Progressing   Problem: Health Behavior/Discharge Planning: Goal: Ability to manage health-related needs will improve Outcome: Progressing   Problem: Clinical Measurements: Goal: Will remain free from infection Outcome: Progressing Goal: Cardiovascular complication will be avoided Outcome: Progressing   Problem: Nutrition: Goal: Adequate nutrition will be maintained Outcome: Not Progressing

## 2024-03-18 DIAGNOSIS — D649 Anemia, unspecified: Secondary | ICD-10-CM | POA: Diagnosis not present

## 2024-03-18 DIAGNOSIS — I10 Essential (primary) hypertension: Secondary | ICD-10-CM | POA: Diagnosis not present

## 2024-03-18 DIAGNOSIS — E876 Hypokalemia: Secondary | ICD-10-CM | POA: Diagnosis present

## 2024-03-18 DIAGNOSIS — K529 Noninfective gastroenteritis and colitis, unspecified: Secondary | ICD-10-CM | POA: Diagnosis not present

## 2024-03-18 LAB — MAGNESIUM: Magnesium: 1.4 mg/dL — ABNORMAL LOW (ref 1.7–2.4)

## 2024-03-18 LAB — VITAMIN B12: Vitamin B-12: 328 pg/mL (ref 180–914)

## 2024-03-18 LAB — GASTROINTESTINAL PANEL BY PCR, STOOL (REPLACES STOOL CULTURE)

## 2024-03-18 LAB — RENAL FUNCTION PANEL
Albumin: 2.5 g/dL — ABNORMAL LOW (ref 3.5–5.0)
Anion gap: 4 — ABNORMAL LOW (ref 5–15)
BUN: 6 mg/dL — ABNORMAL LOW (ref 8–23)
CO2: 21 mmol/L — ABNORMAL LOW (ref 22–32)
Calcium: 7.2 mg/dL — ABNORMAL LOW (ref 8.9–10.3)
Chloride: 113 mmol/L — ABNORMAL HIGH (ref 98–111)
Creatinine, Ser: 0.68 mg/dL (ref 0.44–1.00)
GFR, Estimated: >60 mL/min (ref >60)
Glucose, Bld: 93 mg/dL (ref 70–99)
Phosphorus: 2.3 mg/dL — ABNORMAL LOW (ref 2.5–4.6)
Potassium: 3.1 mmol/L — ABNORMAL LOW (ref 3.5–5.1)
Sodium: 138 mmol/L (ref 135–145)

## 2024-03-18 LAB — CBC
HCT: 23.8 % — ABNORMAL LOW (ref 36.0–46.0)
Hemoglobin: 7.9 g/dL — ABNORMAL LOW (ref 12.0–15.0)
MCH: 31.6 pg (ref 26.0–34.0)
MCHC: 33.2 g/dL (ref 30.0–36.0)
MCV: 95.2 fL (ref 80.0–100.0)
Platelets: 119 K/uL — ABNORMAL LOW (ref 150–400)
RBC: 2.5 MIL/uL — ABNORMAL LOW (ref 3.87–5.11)
RDW: 14 % (ref 11.5–15.5)
WBC: 2.3 K/uL — ABNORMAL LOW (ref 4.0–10.5)
nRBC: 0 % (ref 0.0–0.2)

## 2024-03-18 LAB — C DIFFICILE QUICK SCREEN W PCR REFLEX
C Diff antigen: NEGATIVE
C Diff interpretation: NOT DETECTED
C Diff toxin: NEGATIVE

## 2024-03-18 LAB — IRON AND TIBC
Iron: 45 ug/dL (ref 28–170)
Saturation Ratios: 19 % (ref 10.4–31.8)
TIBC: 237 ug/dL — ABNORMAL LOW (ref 250–450)
UIBC: 192 ug/dL

## 2024-03-18 LAB — FERRITIN: Ferritin: 86 ng/mL (ref 11–307)

## 2024-03-18 LAB — FOLATE: Folate: 16.8 ng/mL (ref >5.9)

## 2024-03-18 MED ORDER — POTASSIUM CHLORIDE 10 MEQ/100ML IV SOLN
10.0000 meq | INTRAVENOUS | Status: AC
  Administered 2024-03-18 (×5): 10 meq via INTRAVENOUS
  Filled 2024-03-18 (×5): qty 100

## 2024-03-18 MED ORDER — SODIUM CHLORIDE 0.9 % IV SOLN: INTRAVENOUS | Status: AC

## 2024-03-18 MED ORDER — POTASSIUM CHLORIDE CRYS ER 20 MEQ PO TBCR
40.0000 meq | EXTENDED_RELEASE_TABLET | ORAL | Status: DC
  Administered 2024-03-18: 20 meq via ORAL
  Filled 2024-03-18: qty 2

## 2024-03-18 MED ORDER — K PHOS MONO-SOD PHOS DI & MONO 155-852-130 MG PO TABS
250.0000 mg | ORAL_TABLET | Freq: Two times a day (BID) | ORAL | Status: AC
  Administered 2024-03-18 – 2024-03-20 (×5): 250 mg via ORAL
  Filled 2024-03-18 (×5): qty 1

## 2024-03-18 MED ORDER — LOSARTAN POTASSIUM 50 MG PO TABS
25.0000 mg | ORAL_TABLET | Freq: Every day | ORAL | Status: DC
  Administered 2024-03-18: 25 mg via ORAL
  Filled 2024-03-18: qty 1

## 2024-03-18 MED ORDER — MAGNESIUM SULFATE 4 GM/100ML IV SOLN
4.0000 g | Freq: Once | INTRAVENOUS | Status: AC
  Administered 2024-03-18: 4 g via INTRAVENOUS
  Filled 2024-03-18: qty 100

## 2024-03-18 NOTE — Evaluation (Signed)
 Physical Therapy Evaluation Patient Details Name: Caroline Snyder MRN: [id number] DOB: [birth year] Today's Date: 03/18/2024  History of Present Illness  Patient is a 83 yo female presenting to the ED with nausea, vomiting, and diarrhea on 03/16/24. Admitted with colitis. 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 Per patient report, patient has family that checks on her consistently. Currently, patient presenting with cognitive impairments, and minimal decreased activity tolerance. Patient CGA/SBA for basic mobility tasks including ambulating in hall, stepping bkwd and sideways and demonstrating dance steps (pt loves to dance).  Pt should progress to dc home with PRN family assist.       If plan is discharge home, recommend the following:     Can travel by private vehicle        Equipment Recommendations None recommended by PT  Recommendations for Other Services       Functional Status Assessment Patient has had a recent decline in their functional status and demonstrates the ability to make significant improvements in function in a reasonable and predictable amount of time.     Precautions / Restrictions Precautions Precautions: Fall Recall of Precautions/Restrictions: Impaired Restrictions Weight Bearing Restrictions Per Provider Order: No      Mobility  Bed Mobility Overal bed mobility: Needs Assistance Bed Mobility: Supine to Sit     Supine to sit: Contact guard     General bed mobility comments: CGA for safety    Transfers Overall transfer level: Needs assistance Equipment used: 1 person hand held assist Transfers: Sit to/from Stand Sit to Stand: Contact guard assist, Min assist           General  transfer comment: Patient needing up to min A for some minimal LOB (see PT note for further details) Patient using grab bar in bathroom to come into standing, and reaching out for OTs hand for stability when completing transfer to the bathroom    Ambulation/Gait Ambulation/Gait assistance: Min assist, Contact guard assist Gait Distance (Feet): 200 Feet (and 15' into bathroom) Assistive device: None Gait Pattern/deviations: Step-through pattern, Decreased step length - right, Decreased step length - left, Shuffle       General Gait Details: Initial steady assist with mild instability but progressed to CGA/SBA with pt side-stepping, back-stepping and attempting to dance  Stairs            Wheelchair Mobility     Tilt Bed    Modified Rankin (Stroke Patients Only)       Balance Overall balance assessment: Needs assistance Sitting-balance support: Feet supported, No upper extremity supported Sitting balance-Leahy Scale: Good     Standing balance support: Single extremity supported, During functional activity Standing balance-Leahy Scale: Fair                               Pertinent Vitals/Pain Pain Assessment Pain Assessment: No/denies pain    Home Living Family/patient expects to be discharged to:: Private residence Living Arrangements: Alone Available Help at Discharge: Family;Available PRN/intermittently Type of Home: House Home Access: Level entry       Home Layout: One level Home Equipment: None Additional Comments: All information from previous admission, some differences in answers when completing eval    Prior Function Prior Level of Function : Independent/Modified Independent;Driving  Mobility Comments: independent, driving short distances. supportive family checks in on her frequently ADLs Comments: independent     Extremity/Trunk Assessment   Upper Extremity Assessment Upper Extremity Assessment: Overall WFL for  tasks assessed    Lower Extremity Assessment Lower Extremity Assessment: Generalized weakness       Communication   Communication Communication: No apparent difficulties    Cognition Arousal: Alert Behavior During Therapy: WFL for tasks assessed/performed                             Following commands: Impaired Following commands impaired: Only follows one step commands consistently, Follows multi-step commands with increased time, Follows multi-step commands inconsistently     Cueing Cueing Techniques: Verbal cues, Gestural cues     General Comments      Exercises     Assessment/Plan    PT Assessment Patient needs continued PT services  PT Problem List Decreased strength;Decreased activity tolerance;Decreased balance;Decreased mobility;Decreased knowledge of use of DME       PT Treatment Interventions DME instruction;Gait training;Functional mobility training;Therapeutic activities;Therapeutic exercise;Patient/family education    PT Goals (Current goals can be found in the Care Plan section)  Acute Rehab PT Goals Patient Stated Goal: Regain IND PT Goal Formulation: With patient Time For Goal Achievement: 04/01/24 Potential to Achieve Goals: Good    Frequency Min 3X/week     Co-evaluation               AM-PAC PT 6 Clicks Mobility  Outcome Measure Help needed turning from your back to your side while in a flat bed without using bedrails?: None Help needed moving from lying on your back to sitting on the side of a flat bed without using bedrails?: None Help needed moving to and from a bed to a chair (including a wheelchair)?: A Little Help needed standing up from a chair using your arms (e.g., wheelchair or bedside chair)?: A Little Help needed to walk in hospital room?: A Little Help needed climbing 3-5 steps with a railing? : A Little 6 Click Score: 20    End of Session Equipment Utilized During Treatment: Gait belt Activity  Tolerance: Patient tolerated treatment well Patient left: in chair;with call bell/phone within reach;with chair alarm set Nurse Communication: Mobility status PT Visit Diagnosis: Unsteadiness on feet (R26.81)    Time: 8844-8782 PT Time Calculation (min) (ACUTE ONLY): 22 min   Charges:   PT Evaluation $PT Eval Low Complexity: 1 Low   PT General Charges $$ ACUTE PT VISIT: 1 Visit         [facility] PT Acute Rehabilitation Services Office [phone]   Winthrop Shannahan 03/18/2024, 3:38 PM

## 2024-03-18 NOTE — Evaluation (Signed)
 Occupational Therapy Evaluation Patient Details Name: Caroline Snyder MRN: [id number] DOB: [birth year] Today's Date: 03/18/2024   History of Present Illness   Patient is a 83 yo female presenting to the ED with nausea, vomiting, and diarrhea on 03/16/24. Admitted with colitis. 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 Per patient report, patient has family that checks on her consistently. Currently, patient presenting with cognitive impairments, and minimal decreased activity tolerance. Patient CGA for ADL management, and one person HHA for functional mobility (min A for safety). Patient would benefit from further cognitive assessment at next session. OT recommending HHOT at this time; OT will continue to follow acutely.      If plan is discharge home, recommend the following:   Supervision due to cognitive status;Direct supervision/assist for medications management;Direct supervision/assist for financial management;Assistance with cooking/housework;A little help with walking and/or transfers;A little help with bathing/dressing/bathroom     Functional Status Assessment   Patient has had a recent decline in their functional status and demonstrates the ability to make significant improvements in function in a reasonable and predictable amount of time.     Equipment Recommendations   None recommended by OT     Recommendations for Other Services         Precautions/Restrictions   Precautions Precautions: Fall Recall of Precautions/Restrictions: Impaired Restrictions Weight Bearing Restrictions Per Provider Order: No     Mobility Bed Mobility Overal bed mobility: Needs Assistance Bed Mobility: Supine to Sit      Supine to sit: Contact guard     General bed mobility comments: CGA for safety    Transfers Overall transfer level: Needs assistance Equipment used: 1 person hand held assist Transfers: Sit to/from Stand Sit to Stand: Contact guard assist, Min assist           General transfer comment: Patient needing up to min A for some minimal LOB (see PT note for further details) Patient using grab bar in bathroom to come into standing, and reaching out for OTs hand for stability when completing transfer to the bathroom      Balance Overall balance assessment: Needs assistance Sitting-balance support: Feet supported, No upper extremity supported Sitting balance-Leahy Scale: Good     Standing balance support: Single extremity supported, During functional activity Standing balance-Leahy Scale: Fair                             ADL either performed or assessed with clinical judgement   ADL Overall ADL's : Needs assistance/impaired Eating/Feeding: Sitting;Modified independent   Grooming: Wash/dry face;Wash/dry hands;Standing;Supervision/safety   Upper Body Bathing: Set up;Sitting   Lower Body Bathing: Set up;Sitting/lateral leans;Sit to/from stand   Upper Body Dressing : Set up;Sitting   Lower Body Dressing: Set up;Sitting/lateral leans;Sit to/from stand   Toilet Transfer: Minimal assistance;Ambulation   Toileting- Clothing Manipulation and Hygiene: Contact guard assist;Sitting/lateral lean;Sit to/from stand       Functional mobility during ADLs: Contact guard assist;Cueing for safety General ADL Comments: Prior to this admission, patient living alone, and still driving. Per patient report, patient has family that checks on her consistently. Currently, patient presenting with cognitive impairments, and minimal decreased activity tolerance. Patient CGA for ADL management, and one person HHA for functional mobility (min A for safety). Patient  would benefit from further  cognitive assessment at next session. OT recommending HHOT at this time; OT will continue to follow acutely.     Vision Baseline Vision/History: 0 No visual deficits Ability to See in Adequate Light: 0 Adequate Patient Visual Report: No change from baseline Vision Assessment?: No apparent visual deficits     Perception Perception: Not tested       Praxis Praxis: Not tested       Pertinent Vitals/Pain Pain Assessment Pain Assessment: No/denies pain     Extremity/Trunk Assessment Upper Extremity Assessment Upper Extremity Assessment: Overall WFL for tasks assessed   Lower Extremity Assessment Lower Extremity Assessment: Defer to PT evaluation   Cervical / Trunk Assessment Cervical / Trunk Assessment: Normal   Communication Communication Communication: No apparent difficulties   Cognition Arousal: Alert Behavior During Therapy: WFL for tasks assessed/performed Cognition: Cognition impaired, History of cognitive impairments   Orientation impairments: Time Awareness: Intellectual awareness intact, Online awareness intact Memory impairment (select all impairments): Short-term memory, Declarative long-term memory Attention impairment (select first level of impairment): Focused attention Executive functioning impairment (select all impairments): Sequencing, Problem solving, Organization OT - Cognition Comments: Per chart, history of vascular dementia, likes to joke, difficulty following commands at times, did not recall how to contact the nurse                 Following commands: Impaired Following commands impaired: Only follows one step commands consistently, Follows multi-step commands with increased time, Follows multi-step commands inconsistently     Cueing  General Comments   Cueing Techniques: Verbal cues;Gestural cues  VSS on RA   Exercises     Shoulder Instructions      Home Living Family/patient expects to be discharged to:: Private  residence Living Arrangements: Alone Available Help at Discharge: Family;Available PRN/intermittently Type of Home: House Home Access: Level entry     Home Layout: One level     Bathroom Shower/Tub: Tub/shower unit;Walk-in shower   Bathroom Toilet: Standard     Home Equipment: None   Additional Comments: All information from previous admission, some differences in answers when completing eval      Prior Functioning/Environment Prior Level of Function : Independent/Modified Independent;Driving             Mobility Comments: independent, driving short distances. supportive family checks in on her frequently ADLs Comments: independent    OT Problem List: Decreased activity tolerance;Impaired balance (sitting and/or standing);Decreased cognition   OT Treatment/Interventions: Self-care/ADL training;Energy conservation;Manual therapy;Therapeutic activities;Cognitive remediation/compensation;Patient/family education;Balance training;Therapeutic exercise      OT Goals(Current goals can be found in the care plan section)   Acute Rehab OT Goals Patient Stated Goal: to get better OT Goal Formulation: With patient Time For Goal Achievement: 04/01/24 Potential to Achieve Goals: Good   OT Frequency:  Min 3X/week    Co-evaluation PT/OT/SLP Co-Evaluation/Treatment:  (per RN request as patient has been weak this AM)            AM-PAC OT 6 Clicks Daily Activity     Outcome Measure Help from another person eating meals?: None Help from another person taking care of personal grooming?: A Little Help from another person toileting, which includes using toliet, bedpan, or urinal?: A Little Help from another person bathing (including washing, rinsing, drying)?: A Little Help from another person to put on and taking off regular upper body clothing?: A Little Help from another person to put on and taking off regular lower body clothing?: A Little 6 Click  Score: 19   End of  Session Nurse Communication: Mobility status  Activity Tolerance: Patient tolerated treatment well Patient left: in chair;with call bell/phone within reach;with chair alarm set  OT Visit Diagnosis: Muscle weakness (generalized) (M62.81);Other symptoms and signs involving cognitive function                Time: 8847-8785 OT Time Calculation (min): 22 min Charges:  OT General Charges $OT Visit: 1 Visit OT Evaluation $OT Eval Moderate Complexity: 1 Mod  Ronal Gift E. Osias Resnick, OTR/L Acute Rehabilitation Services [phone]   Ronal Gift Salt 03/18/2024, 12:30 PM

## 2024-03-18 NOTE — Progress Notes (Signed)
 PROGRESS NOTE    Caroline Snyder  FMW:[id number] DOB: Oct 19, 1940 DOA: 03/16/2024 PCP: [name], NP    Chief Complaint  Patient presents with   Abdominal Pain    Brief Narrative:  Patient 83 year old female history of vascular dementia, primary cholangiocarcinoma over the extrahepatic bile duct currently on chemo and radiation, depression, GERD, hyperlipidemia, hypertension presented to the ED with nausea vomiting diarrhea.  CT abdomen and pelvis done concerning for colitis.  Stool studies ordered and pending.  Patient placed empirically on IV antibiotics.  GI consulted for further evaluation and management.   Assessment & Plan:   Principal Problem:   Colitis Active Problems:   Hyperlipidemia   Essential hypertension   Nausea and vomiting   Vascular dementia (HCC)   Cholangiocarcinoma (HCC)   Anemia   Pressure injury of skin   Hypomagnesemia   Hypophosphatemia   Hypokalemia  #1 acute colitis/ileitis -Patient presented with nausea and vomiting diarrhea. - Differential includes infectious versus inflammatory versus ischemic colitis. - CT abdomen and pelvis obtained with colonic wall thickening and pericolonic edema extending from the cecum through the transverse colon and also involving the sigmoid colon consistent with colitis of infectious, inflammatory or ischemic etiology.  Fluid-filled small bowel in the right lower quadrant with mild mucosal enhancement of terminal ileum consistent with ileitis.  Common bile duct stent in place with pneumobilia.  No biliary distention.  Indistinct hazy soft tissue density at the porta hepatis, surrounding common bile duct stent and now with interval focal narrowed appearance of the portal vein and presumably related to history of known bile duct malignancy.  No evidence of portal vein thrombosis or occlusion.  Hepatic steatosis.  Aortic atherosclerosis. - GI pathogen panel by PCR pending - C. difficile by PCR negative. - Patient  afebrile. - Continue empiric antibiotics of IV Rocephin , IV Flagyl . - IV antiemetics, IV fluids, supportive care. - Patient seen in consultation by GI who are in agreement with current antibiotics and awaiting stool studies. - GI also recommending holding capecitabine  until symptoms are resolved and may need to consider dose reduction.  2.  Cholangiocarcinoma -Patient currently undergoing chemotherapy and radiation treatment. - Continue to hold capecitabine  until resolution of problem #1 per GI recommendations. - Biliary stent noted to be in place on CT abdomen and pelvis. - Per GI, Dr. Wilhelmenia may be able to perform stent exchange tomorrow while inpatient.     - GI following.  -Patient's oncologist informed of admission via epic. -Will inform radiation oncology, Dr. Dewey of patient's admission via secure chat. - Outpatient follow-up with oncology.  3.  Anemia -Felt likely secondary to hemodilution and chemotherapy. - Patient with no overt bleeding. - Anemia panel with iron level of 45, TIBC of 237, ferritin of 86, folate of 16.8, vitamin B12 328. -Hemoglobin currently at 7.9 from 10.6 on admission. - Follow H&H. - Transfusion threshold hemoglobin < 7. - GI following and at this time no plans for endoscopic evaluation for bleeding however per GI patient may get an ERCP tomorrow at which time upper GI bleeding sources can be evaluated at the same time. - Per GI.  4.  Vascular dementia -Delirium precautions.  5.  Hyperlipidemia - Hold Crestor  during the hospitalization and resume on discharge.   6.  Hypertension -Continue to hold home regimen ARB. - IV hydralazine  as needed.  7.  Hypokalemia/hypomagnesemia/hypophosphatemia - Potassium at 3.1. - Magnesium  at 1.4. - Phosphorus of 2.3. - Magnesium  sulfate 4 g IV x 1. - Patient  unable to tolerate oral potassium pill and as such we will place on IV potassium supplementation. - K-Phos 250 mg p.o. twice daily x 3 days. - Repeat  labs in the AM.   DVT prophylaxis: SCDs Code Status: DNR Family Communication: Updated patient and son at bedside. Disposition: TBD  Status is: Inpatient Remains inpatient appropriate because: Severity of illness   Consultants:  Gastroenterology: Dr. Stacia 03/17/2024  Procedures:  CT abdomen and pelvis 03/16/2024   Antimicrobials:  Anti-infectives (From admission, onward)    Start     Dose/Rate Route Frequency Ordered Stop   03/17/24 1000  cefTRIAXone  (ROCEPHIN ) 2 g in sodium chloride  0.9 % 100 mL IVPB        2 g 200 mL/hr over 30 Minutes Intravenous Every 24 hours 03/17/24 0105     03/17/24 1000  metroNIDAZOLE  (FLAGYL ) IVPB 500 mg        500 mg 100 mL/hr over 60 Minutes Intravenous Every 12 hours 03/17/24 0105     03/16/24 2245  ciprofloxacin  (CIPRO ) IVPB 400 mg        400 mg 200 mL/hr over 60 Minutes Intravenous  Once 03/16/24 2244 03/17/24 0003   03/16/24 2245  metroNIDAZOLE  (FLAGYL ) IVPB 500 mg        500 mg 100 mL/hr over 60 Minutes Intravenous  Once 03/16/24 2244 03/17/24 0150         Subjective: Patient sitting up in bed.  Noted to have had 2 bouts of emesis this morning per RN.  Patient still with watery liquid stool.  Patient denies any chest pain.  No shortness of breath.  Patient does endorse some abdominal discomfort however no significant abdominal pain.  Noted to have tolerated clear liquids.  Patient and son wondering whether diet can be advanced.   Objective: Vitals:   03/17/24 1529 03/17/24 2028 03/18/24 0552 03/18/24 1433  BP: (!) 167/72 (!) 165/78 (!) 154/82 (!) 141/70  Pulse: 72 78 82 69  Resp: 18 18 16 19   Temp: 97.9 F (36.6 C) 97.7 F (36.5 C) 98 F (36.7 C) (!) 97.5 F (36.4 C)  TempSrc: Oral Oral Oral Oral  SpO2: 100% 100% 99% 99%  Weight:      Height:        Intake/Output Summary (Last 24 hours) at 03/18/2024 1720 Last data filed at 03/18/2024 1543 Gross per 24 hour  Intake 1553.72 ml  Output --  Net 1553.72 ml   Filed  Weights   03/17/24 0136  Weight: 44.4 kg    Examination:  General exam: NAD.  Dry mucous membranes.  Respiratory system: CTAB.  No wheezes, no crackles, no rhonchi.  Fair air movement.  Speaking in full sentences.   Cardiovascular system: RRR no murmurs rubs or gallops.  No JVD.  No pitting lower extremity edema.  Gastrointestinal system: Abdomen is soft, nondistended, decreased diffuse tenderness to palpation.  No rebound.  No guarding.  Positive bowel sounds.  Central nervous system: Alert and oriented.  Moving extremity spontaneously no focal neurological deficits. Extremities: Symmetric 5 x 5 power. Skin: No rashes, lesions or ulcers Psychiatry: Judgement and insight appear fair. Mood & affect appropriate.     Data Reviewed: I have personally reviewed following labs and imaging studies  CBC: Recent Labs  Lab 03/16/24 2007 03/17/24 0125 03/18/24 0642  WBC 6.7 5.2 2.3*  NEUTROABS 5.4  --   --   HGB 10.6* 9.3* 7.9*  HCT 31.4* 28.6* 23.8*  MCV 93.7 96.0 95.2  PLT 189 136*  119*    Basic Metabolic Panel: Recent Labs  Lab 03/16/24 2007 03/17/24 0125 03/18/24 0642  NA 136 135 138  K 3.9 4.1 3.1*  CL 103 105 113*  CO2 22 22 21*  GLUCOSE 147* 127* 93  BUN 17 14 6*  CREATININE 0.95 0.84 0.68  CALCIUM  9.1 8.2* 7.2*  MG  --  1.8 1.4*  PHOS  --   --  2.3*    GFR: Estimated Creatinine Clearance: 36.7 mL/min (by C-G formula based on SCr of 0.68 mg/dL).  Liver Function Tests: Recent Labs  Lab 03/16/24 2007 03/18/24 0642  AST 24  --   ALT 13  --   ALKPHOS 39  --   BILITOT 0.6  --   PROT 6.3*  --   ALBUMIN 3.6 2.5*    CBG: No results for input(s): GLUCAP in the last 168 hours.   Recent Results (from the past 240 hours)  C Difficile Quick Screen w PCR reflex     Status: None   Collection Time: 03/17/24 12:37 AM   Specimen: Stool  Result Value Ref Range Status   C Diff antigen NEGATIVE NEGATIVE Final   C Diff toxin NEGATIVE NEGATIVE Final   C Diff  interpretation No C. difficile detected.  Final    Comment: Performed at [facility], [address] [place], [place] [address]         Radiology Studies: CT ABDOMEN PELVIS W CONTRAST Result Date: 03/16/2024 CLINICAL DATA:  Nausea vomiting diarrhea history of bile duct cancer EXAM: CT ABDOMEN AND PELVIS WITH CONTRAST TECHNIQUE: Multidetector CT imaging of the abdomen and pelvis was performed using the standard protocol following bolus administration of intravenous contrast. RADIATION DOSE REDUCTION: This exam was performed according to the departmental dose-optimization program which includes automated exposure control, adjustment of the mA and/or kV according to patient size and/or use of iterative reconstruction technique. CONTRAST:  OMNIPAQUE  IOHEXOL  300 MG/ML  SOLN COMPARISON:  CT 12/29/2023, MRI 12/18/2023 FINDINGS: Lower chest: Lung bases demonstrate no acute airspace disease. Hepatobiliary: Hepatic steatosis. Pneumobilia. Common bile duct stent remains in place. No biliary distension. Decompressed gallbladder with small stone at the fundus. Indistinct hazy density surrounding the bile duct stent at the porta hepatis. Narrowed appearance of the portal vein at the porta hepatis by indistinct surrounding soft tissue, series 2, image 24, coronal series 8, image 41, and presumably related to the known history of bowel duct malignancy. Pancreas: No ductal dilatation.  No definite inflammatory change. Spleen: Normal in size without focal abnormality. Adrenals/Urinary Tract: Adrenal glands are normal. Kidneys show no hydronephrosis. The bladder is unremarkable Stomach/Bowel: The stomach is decompressed. No dilated small bowel. Areas of wall thickening and pericolonic stranding, involves the cecum, ascending colon, hepatic flexure and transverse colon. Similar changes at the sigmoid colon. Negative appendix. Diverticular disease. Some fluid-filled small bowel in the right lower  quadrant, mucosal enhancement of the terminal ileum and pelvic small bowel loops. Vascular/Lymphatic: Aortic atherosclerosis. No aneurysm. Small porta hepatis lymph nodes measuring up to 8 mm. As stated previously, focal narrowed appearance of the portal vein but without thrombus or occlusion. Reproductive: Status post hysterectomy. No adnexal masses. Other: Negative for pelvic effusion or free air. Musculoskeletal: No acute or suspicious osseous abnormality. IMPRESSION: IMPRESSION 1. Colon wall thickening and pericolonic edema extends from the cecum through the transverse colon and also involving the sigmoid colon consistent with colitis of infectious, inflammatory or ischemic etiology. Fluid-filled small bowel in the right lower quadrant  with mild mucosal enhancement of terminal ileum consistent with ileitis. 2. Common bile duct stent remains in place with pneumobilia. No biliary distension. Indistinct hazy soft tissue density at the porta hepatis, surrounding common bile duct stent and now with interval focal narrowed appearance of the portal vein, and presumably elated to the history of known bile duct malignancy. No evidence for portal vein thrombus or occlusion on this exam. 3. Hepatic steatosis. 4. Aortic atherosclerosis. Aortic Atherosclerosis (ICD10-I70.0). Electronically Signed   By: Luke Bun M.D.   On: 03/16/2024 22:01        Scheduled Meds:  aspirin  EC  81 mg Oral Daily   feeding supplement  237 mL Oral BID BM   losartan   25 mg Oral Daily   phosphorus  250 mg Oral BID   Continuous Infusions:  sodium chloride  Stopped (03/18/24 0944)   cefTRIAXone  (ROCEPHIN )  IV Stopped (03/18/24 1014)   famotidine  (PEPCID ) IV Stopped (03/17/24 1822)   metronidazole  100 mL/hr at 03/18/24 1543   potassium chloride  10 mEq (03/18/24 1635)     LOS: 2 days    Time spent: 40 minutes    Toribio Hummer, MD [facility]   To contact the attending provider between 7A-7P or the covering  provider during after hours 7P-7A, please log into the web site [email or web address] and access using universal [place] password for that web site. If you do not have the password, please call the hospital operator.  03/18/2024, 5:20 PM

## 2024-03-18 NOTE — Progress Notes (Signed)
 [facility] ROUNDING NOTE   Subjective: Patient feels about the same today.  She reports ongoing nausea and vomiting anytime she tries to eat or drink.  She states that she had a liquid bowel movement this morning. GI pathogen panel pending.  Labs show mild progression of pancytopenia.   Objective: Vital signs in last 24 hours: Temp:  [97.7 F (36.5 C)-98 F (36.7 C)] 98 F (36.7 C) (08/17 0552) Pulse Rate:  [72-82] 82 (08/17 0552) Resp:  [16-18] 16 (08/17 0552) BP: (154-167)/(72-82) 154/82 (08/17 0552) SpO2:  [99 %-100 %] 99 % (08/17 0552) Last BM Date : 03/18/24 General: NAD, frail elderly Caucasian female Lungs:  CTA b/l, no w/r/r Heart:  RRR, no m/r/g Abdomen:  Soft, NT, ND, +BS Ext:  No c/c/e    Intake/Output from previous day: 08/16 0701 - 08/17 0700 In: 1614 [P.O.:500; I.V.:764; IV Piggyback:350] Out: 610 [Urine:600; Emesis/NG output:10] Intake/Output this shift: Total I/O In: 120 [P.O.:120] Out: -    Lab Results: Recent Labs    03/16/24 2007 03/17/24 0125 03/18/24 0642  WBC 6.7 5.2 2.3*  HGB 10.6* 9.3* 7.9*  PLT 189 136* 119*  MCV 93.7 96.0 95.2   BMET Recent Labs    03/16/24 2007 03/17/24 0125 03/18/24 0642  NA 136 135 138  K 3.9 4.1 3.1*  CL 103 105 113*  CO2 22 22 21*  GLUCOSE 147* 127* 93  BUN 17 14 6*  CREATININE 0.95 0.84 0.68  CALCIUM  9.1 8.2* 7.2*   LFT Recent Labs    03/16/24 2007 03/18/24 0642  PROT 6.3*  --   ALBUMIN 3.6 2.5*  AST 24  --   ALT 13  --   ALKPHOS 39  --   BILITOT 0.6  --    PT/INR No results for input(s): INR in the last 72 hours.    Imaging/Other results: CT ABDOMEN PELVIS W CONTRAST Result Date: 03/16/2024 CLINICAL DATA:  Nausea vomiting diarrhea history of bile duct cancer EXAM: CT ABDOMEN AND PELVIS WITH CONTRAST TECHNIQUE: Multidetector CT imaging of the abdomen and pelvis was performed using the standard protocol following bolus administration of intravenous contrast. RADIATION DOSE  REDUCTION: This exam was performed according to the departmental dose-optimization program which includes automated exposure control, adjustment of the mA and/or kV according to patient size and/or use of iterative reconstruction technique. CONTRAST:  OMNIPAQUE  IOHEXOL  300 MG/ML  SOLN COMPARISON:  CT 12/29/2023, MRI 12/18/2023 FINDINGS: Lower chest: Lung bases demonstrate no acute airspace disease. Hepatobiliary: Hepatic steatosis. Pneumobilia. Common bile duct stent remains in place. No biliary distension. Decompressed gallbladder with small stone at the fundus. Indistinct hazy density surrounding the bile duct stent at the porta hepatis. Narrowed appearance of the portal vein at the porta hepatis by indistinct surrounding soft tissue, series 2, image 24, coronal series 8, image 41, and presumably related to the known history of bowel duct malignancy. Pancreas: No ductal dilatation.  No definite inflammatory change. Spleen: Normal in size without focal abnormality. Adrenals/Urinary Tract: Adrenal glands are normal. Kidneys show no hydronephrosis. The bladder is unremarkable Stomach/Bowel: The stomach is decompressed. No dilated small bowel. Areas of wall thickening and pericolonic stranding, involves the cecum, ascending colon, hepatic flexure and transverse colon. Similar changes at the sigmoid colon. Negative appendix. Diverticular disease. Some fluid-filled small bowel in the right lower quadrant, mucosal enhancement of the terminal ileum and pelvic small bowel loops. Vascular/Lymphatic: Aortic atherosclerosis. No aneurysm. Small porta hepatis lymph nodes measuring up to 8 mm. As stated previously,  focal narrowed appearance of the portal vein but without thrombus or occlusion. Reproductive: Status post hysterectomy. No adnexal masses. Other: Negative for pelvic effusion or free air. Musculoskeletal: No acute or suspicious osseous abnormality. IMPRESSION: IMPRESSION 1. Colon wall thickening and pericolonic  edema extends from the cecum through the transverse colon and also involving the sigmoid colon consistent with colitis of infectious, inflammatory or ischemic etiology. Fluid-filled small bowel in the right lower quadrant with mild mucosal enhancement of terminal ileum consistent with ileitis. 2. Common bile duct stent remains in place with pneumobilia. No biliary distension. Indistinct hazy soft tissue density at the porta hepatis, surrounding common bile duct stent and now with interval focal narrowed appearance of the portal vein, and presumably elated to the history of known bile duct malignancy. No evidence for portal vein thrombus or occlusion on this exam. 3. Hepatic steatosis. 4. Aortic atherosclerosis. Aortic Atherosclerosis (ICD10-I70.0). Electronically Signed   By: Luke Bun M.D.   On: 03/16/2024 22:01      Assessment and Plan:  82 year old female with recently diagnosed cholangiocarcinoma, currently undergoing treatment with radiation and Xeloda , admitted with 2 to 3 weeks of nausea, vomiting and poor p.o. intake, also with 2 days of nonbloody diarrhea, with CT findings of ileocolitis. Clinical presentation most consistent with either capecitabine  induced colitis vs infectious ileocolitis    Acute colitis, nausea/vomiting - Agree with empiric antibiotics for now; would stop once GI pathogen panel back/negative - Await stools to rule out infectious colitis - Would hold capecitabine  until symptoms resolve; consider dose reduction, defer to oncology - Continue supportive care with IV hydration, antiemetics; consider scheduled Reglan  if p.o. intake not improving by the end of the day - Advance diet to clear liquid diet, advance as tolerated - No plans for endoscopic evaluation at this time.   Cholangiocarcinoma - Currently undergoing treatment as above - Hold capecitabine  as above - Biliary stent in place, with good bile drainage - Per Dr. Wilhelmenia, he may be able to perform stent  exchange tomorrow while patient is admitted.  Will make n.p.o. after midnight just in case.   Anemia -Hemoglobin down again today, along with mild worsening of leukopenia and thrombocytopenia, likely secondary to chemotherapy - No evidence of GI bleeding - No plans for endoscopic evaluation for bleeding, but patient may get ERCP tomorrow, at which time upper GI bleeding sources can also be evaluated for  Dr. Wilhelmenia will be taking over inpatient GI service tomorrow  Glendia FORBES Holt, MD  03/18/2024, 12:23 PM  Gastroenterology

## 2024-03-18 NOTE — H&P (View-Only) (Signed)
 [facility] ROUNDING NOTE   Subjective: Patient feels about the same today.  She reports ongoing nausea and vomiting anytime she tries to eat or drink.  She states that she had a liquid bowel movement this morning. GI pathogen panel pending.  Labs show mild progression of pancytopenia.   Objective: Vital signs in last 24 hours: Temp:  [97.7 F (36.5 C)-98 F (36.7 C)] 98 F (36.7 C) (08/17 0552) Pulse Rate:  [72-82] 82 (08/17 0552) Resp:  [16-18] 16 (08/17 0552) BP: (154-167)/(72-82) 154/82 (08/17 0552) SpO2:  [99 %-100 %] 99 % (08/17 0552) Last BM Date : 03/18/24 General: NAD, frail elderly Caucasian female Lungs:  CTA b/l, no w/r/r Heart:  RRR, no m/r/g Abdomen:  Soft, NT, ND, +BS Ext:  No c/c/e    Intake/Output from previous day: 08/16 0701 - 08/17 0700 In: 1614 [P.O.:500; I.V.:764; IV Piggyback:350] Out: 610 [Urine:600; Emesis/NG output:10] Intake/Output this shift: Total I/O In: 120 [P.O.:120] Out: -    Lab Results: Recent Labs    03/16/24 2007 03/17/24 0125 03/18/24 0642  WBC 6.7 5.2 2.3*  HGB 10.6* 9.3* 7.9*  PLT 189 136* 119*  MCV 93.7 96.0 95.2   BMET Recent Labs    03/16/24 2007 03/17/24 0125 03/18/24 0642  NA 136 135 138  K 3.9 4.1 3.1*  CL 103 105 113*  CO2 22 22 21*  GLUCOSE 147* 127* 93  BUN 17 14 6*  CREATININE 0.95 0.84 0.68  CALCIUM  9.1 8.2* 7.2*   LFT Recent Labs    03/16/24 2007 03/18/24 0642  PROT 6.3*  --   ALBUMIN 3.6 2.5*  AST 24  --   ALT 13  --   ALKPHOS 39  --   BILITOT 0.6  --    PT/INR No results for input(s): INR in the last 72 hours.    Imaging/Other results: CT ABDOMEN PELVIS W CONTRAST Result Date: 03/16/2024 CLINICAL DATA:  Nausea vomiting diarrhea history of bile duct cancer EXAM: CT ABDOMEN AND PELVIS WITH CONTRAST TECHNIQUE: Multidetector CT imaging of the abdomen and pelvis was performed using the standard protocol following bolus administration of intravenous contrast. RADIATION DOSE  REDUCTION: This exam was performed according to the departmental dose-optimization program which includes automated exposure control, adjustment of the mA and/or kV according to patient size and/or use of iterative reconstruction technique. CONTRAST:  OMNIPAQUE  IOHEXOL  300 MG/ML  SOLN COMPARISON:  CT 12/29/2023, MRI 12/18/2023 FINDINGS: Lower chest: Lung bases demonstrate no acute airspace disease. Hepatobiliary: Hepatic steatosis. Pneumobilia. Common bile duct stent remains in place. No biliary distension. Decompressed gallbladder with small stone at the fundus. Indistinct hazy density surrounding the bile duct stent at the porta hepatis. Narrowed appearance of the portal vein at the porta hepatis by indistinct surrounding soft tissue, series 2, image 24, coronal series 8, image 41, and presumably related to the known history of bowel duct malignancy. Pancreas: No ductal dilatation.  No definite inflammatory change. Spleen: Normal in size without focal abnormality. Adrenals/Urinary Tract: Adrenal glands are normal. Kidneys show no hydronephrosis. The bladder is unremarkable Stomach/Bowel: The stomach is decompressed. No dilated small bowel. Areas of wall thickening and pericolonic stranding, involves the cecum, ascending colon, hepatic flexure and transverse colon. Similar changes at the sigmoid colon. Negative appendix. Diverticular disease. Some fluid-filled small bowel in the right lower quadrant, mucosal enhancement of the terminal ileum and pelvic small bowel loops. Vascular/Lymphatic: Aortic atherosclerosis. No aneurysm. Small porta hepatis lymph nodes measuring up to 8 mm. As stated previously,  focal narrowed appearance of the portal vein but without thrombus or occlusion. Reproductive: Status post hysterectomy. No adnexal masses. Other: Negative for pelvic effusion or free air. Musculoskeletal: No acute or suspicious osseous abnormality. IMPRESSION: IMPRESSION 1. Colon wall thickening and pericolonic  edema extends from the cecum through the transverse colon and also involving the sigmoid colon consistent with colitis of infectious, inflammatory or ischemic etiology. Fluid-filled small bowel in the right lower quadrant with mild mucosal enhancement of terminal ileum consistent with ileitis. 2. Common bile duct stent remains in place with pneumobilia. No biliary distension. Indistinct hazy soft tissue density at the porta hepatis, surrounding common bile duct stent and now with interval focal narrowed appearance of the portal vein, and presumably elated to the history of known bile duct malignancy. No evidence for portal vein thrombus or occlusion on this exam. 3. Hepatic steatosis. 4. Aortic atherosclerosis. Aortic Atherosclerosis (ICD10-I70.0). Electronically Signed   By: Luke Bun M.D.   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 intake, also with 2 days of nonbloody diarrhea, with CT findings of ileocolitis. Clinical presentation most consistent with either capecitabine  induced colitis vs infectious ileocolitis    Acute colitis, nausea/vomiting - Agree with empiric antibiotics for now; would stop once GI pathogen panel back/negative - Await stools to rule out infectious colitis - Would hold capecitabine  until symptoms resolve; consider dose reduction, defer to oncology - Continue supportive care with IV hydration, antiemetics; consider scheduled Reglan  if p.o. intake not improving by the end of the day - Advance diet to clear liquid diet, advance as tolerated - No plans for endoscopic evaluation at this time.   Cholangiocarcinoma - Currently undergoing treatment as above - Hold capecitabine  as above - Biliary stent in place, with good bile drainage - Per Dr. Wilhelmenia, he may be able to perform stent  exchange tomorrow while patient is admitted.  Will make n.p.o. after midnight just in case.   Anemia -Hemoglobin down again today, along with mild worsening of leukopenia and thrombocytopenia, likely secondary to chemotherapy - No evidence of GI bleeding - No plans for endoscopic evaluation for bleeding, but patient may get ERCP tomorrow, at which time upper GI bleeding sources can also be evaluated for  Dr. Wilhelmenia will be taking over inpatient GI service tomorrow  Glendia FORBES Holt, MD  03/18/2024, 12:23 PM  Gastroenterology

## 2024-03-18 NOTE — TOC Initial Note (Signed)
 Transition of Care [facility]) - Initial/Assessment Note    Patient Details  Name: Caroline Snyder MRN: [id number] Date of Birth: September 05, 1940  [facility]) CM/SW Contact:    Sheri ONEIDA Sharps, LCSW Phone Number: 03/18/2024, 3:09 PM  Clinical Narrative:                 Pt from home alone. Pt continues medical workup. PT eval pending. TOC following for dc needs.     Barriers to Discharge: Continued Medical Work up   Patient Goals and CMS Choice Patient states their goals for this hospitalization and ongoing recovery are:: return home   Choice offered to / list presented to : NA      Expected Discharge Plan and Services In-house Referral: NA Discharge Planning Services: NA   Living arrangements for the past 2 months: Single Family Home                 DME Arranged: N/A DME Agency: NA       HH Arranged: NA HH Agency: NA        Prior Living Arrangements/Services Living arrangements for the past 2 months: Single Family Home Lives with:: Self Patient language and need for interpreter reviewed:: Yes Do you feel safe going back to the place where you live?: Yes      Need for Family Participation in Patient Care: Yes (Comment) Care giver support system in place?: Yes (comment)   Criminal Activity/Legal Involvement Pertinent to Current Situation/Hospitalization: No - Comment as needed  Activities of Daily Living   ADL Screening (condition at time of admission) Independently performs ADLs?: Yes (appropriate for developmental age) Is the patient deaf or have difficulty hearing?: No Does the patient have difficulty seeing, even when wearing glasses/contacts?: No Does the patient have difficulty concentrating, remembering, or making decisions?: Yes  Permission Sought/Granted                  Emotional Assessment Appearance:: Appears stated age Attitude/Demeanor/Rapport: Engaged Affect (typically observed): Accepting Orientation: : Oriented to Self,  Oriented to Place, Oriented to Situation Alcohol  / Substance Use: Not Applicable Psych Involvement: No (comment)  Admission diagnosis:  Colitis [K52.9] Patient Active Problem List   Diagnosis Date Noted   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 [place] [address] Phone: [phone] Fax: [phone]     Social Drivers of Health (SDOH) Social History: SDOH Screenings   Food Insecurity: No Food  Insecurity (03/17/2024)  Housing: Low Risk  (03/17/2024)  Transportation Needs: No Transportation Needs (03/17/2024)  Utilities: At Risk (03/17/2024)  Depression (PHQ2-9): Low Risk  (02/02/2024)  Financial Resource Strain: Low Risk  (06/09/2018)  Physical Activity: Insufficiently Active (06/09/2018)  Social Connections: Moderately Integrated (03/17/2024)  Recent Concern: Social Connections - Moderately  Isolated (12/18/2023)  Stress: No Stress Concern Present (06/09/2018)  Tobacco Use: Low Risk  (03/16/2024)   SDOH Interventions:     Readmission Risk Interventions    03/18/2024    3:07 PM 12/23/2023    2:52 PM  Readmission Risk Prevention Plan  Post Dischage Appt  Complete  Medication Screening  Complete  Transportation Screening Complete Complete  PCP or Specialist Appt within 5-7 Days Complete   Home Care Screening Complete   Medication Review (RN CM) Complete

## 2024-03-19 ENCOUNTER — Inpatient Hospital Stay (HOSPITAL_COMMUNITY): Payer: Medicare (Managed Care)

## 2024-03-19 ENCOUNTER — Ambulatory Visit: Payer: Medicare (Managed Care)

## 2024-03-19 ENCOUNTER — Encounter (HOSPITAL_COMMUNITY)
Admission: EM | Disposition: A | Discharge status: Home Health | Payer: Self-pay | Source: Home / Self Care | Attending: Internal Medicine

## 2024-03-19 DIAGNOSIS — Z4659 Encounter for fitting and adjustment of other gastrointestinal appliance and device: Secondary | ICD-10-CM | POA: Diagnosis not present

## 2024-03-19 DIAGNOSIS — K831 Obstruction of bile duct: Secondary | ICD-10-CM | POA: Diagnosis not present

## 2024-03-19 DIAGNOSIS — D649 Anemia, unspecified: Secondary | ICD-10-CM | POA: Diagnosis not present

## 2024-03-19 DIAGNOSIS — Z9889 Other specified postprocedural states: Secondary | ICD-10-CM

## 2024-03-19 DIAGNOSIS — I10 Essential (primary) hypertension: Secondary | ICD-10-CM | POA: Diagnosis not present

## 2024-03-19 DIAGNOSIS — K529 Noninfective gastroenteritis and colitis, unspecified: Secondary | ICD-10-CM | POA: Diagnosis not present

## 2024-03-19 DIAGNOSIS — C240 Malignant neoplasm of extrahepatic bile duct: Secondary | ICD-10-CM

## 2024-03-19 DIAGNOSIS — C221 Intrahepatic bile duct carcinoma: Secondary | ICD-10-CM | POA: Diagnosis not present

## 2024-03-19 DIAGNOSIS — E785 Hyperlipidemia, unspecified: Secondary | ICD-10-CM

## 2024-03-19 HISTORY — PX: ERCP: SHX5425

## 2024-03-19 LAB — CBC WITH DIFFERENTIAL/PLATELET
Abs Immature Granulocytes: 0.03 K/uL (ref 0.00–0.07)
Basophils Absolute: 0 K/uL (ref 0.0–0.1)
Basophils Relative: 1 %
Eosinophils Absolute: 0.2 K/uL (ref 0.0–0.5)
Eosinophils Relative: 5 %
HCT: 28.7 % — ABNORMAL LOW (ref 36.0–46.0)
Hemoglobin: 9.4 g/dL — ABNORMAL LOW (ref 12.0–15.0)
Immature Granulocytes: 1 %
Lymphocytes Relative: 8 %
Lymphs Abs: 0.3 K/uL — ABNORMAL LOW (ref 0.7–4.0)
MCH: 30.6 pg (ref 26.0–34.0)
MCHC: 32.8 g/dL (ref 30.0–36.0)
MCV: 93.5 fL (ref 80.0–100.0)
Monocytes Absolute: 0.3 K/uL (ref 0.1–1.0)
Monocytes Relative: 10 %
Neutro Abs: 2.5 K/uL (ref 1.7–7.7)
Neutrophils Relative %: 75 %
Platelets: 149 K/uL — ABNORMAL LOW (ref 150–400)
RBC: 3.07 MIL/uL — ABNORMAL LOW (ref 3.87–5.11)
RDW: 14.7 % (ref 11.5–15.5)
WBC: 3.4 K/uL — ABNORMAL LOW (ref 4.0–10.5)
nRBC: 0 % (ref 0.0–0.2)

## 2024-03-19 LAB — RENAL FUNCTION PANEL
Albumin: 3.2 g/dL — ABNORMAL LOW (ref 3.5–5.0)
Anion gap: 7 (ref 5–15)
BUN: <5 mg/dL — ABNORMAL LOW (ref 8–23)
CO2: 20 mmol/L — ABNORMAL LOW (ref 22–32)
Calcium: 8.2 mg/dL — ABNORMAL LOW (ref 8.9–10.3)
Chloride: 112 mmol/L — ABNORMAL HIGH (ref 98–111)
Creatinine, Ser: 0.66 mg/dL (ref 0.44–1.00)
GFR, Estimated: >60 mL/min (ref >60)
Glucose, Bld: 104 mg/dL — ABNORMAL HIGH (ref 70–99)
Phosphorus: 2.4 mg/dL — ABNORMAL LOW (ref 2.5–4.6)
Potassium: 4 mmol/L (ref 3.5–5.1)
Sodium: 139 mmol/L (ref 135–145)

## 2024-03-19 LAB — MAGNESIUM: Magnesium: 2.4 mg/dL (ref 1.7–2.4)

## 2024-03-19 SURGERY — ERCP, WITH INTERVENTION IF INDICATED
Anesthesia: General

## 2024-03-19 MED ORDER — PROPOFOL 10 MG/ML IV BOLUS
INTRAVENOUS | Status: DC | PRN
Start: 2024-03-19 — End: 2024-03-19
  Administered 2024-03-19: 80 mg via INTRAVENOUS
  Administered 2024-03-19: 60 mg via INTRAVENOUS

## 2024-03-19 MED ORDER — FENTANYL CITRATE (PF) 100 MCG/2ML IJ SOLN
25.0000 ug | INTRAMUSCULAR | Status: DC | PRN
  Administered 2024-03-19: 25 ug via INTRAVENOUS

## 2024-03-19 MED ORDER — LOSARTAN POTASSIUM 50 MG PO TABS
50.0000 mg | ORAL_TABLET | Freq: Every day | ORAL | Status: DC
  Administered 2024-03-20: 50 mg via ORAL
  Filled 2024-03-19: qty 1

## 2024-03-19 MED ORDER — OXYCODONE HCL 5 MG/5ML PO SOLN: 5.0000 mg | Freq: Once | ORAL | Status: DC | PRN

## 2024-03-19 MED ORDER — LACTATED RINGERS IV SOLN
INTRAVENOUS | Status: DC | PRN
Start: 2024-03-19 — End: 2024-03-19

## 2024-03-19 MED ORDER — INDOMETHACIN 50 MG RE SUPP
RECTAL | Status: AC
Start: 2024-03-19 — End: 2024-03-19
  Filled 2024-03-19: qty 2

## 2024-03-19 MED ORDER — GLUCAGON HCL RDNA (DIAGNOSTIC) 1 MG IJ SOLR
INTRAMUSCULAR | Status: AC
  Filled 2024-03-19: qty 2

## 2024-03-19 MED ORDER — OXYCODONE HCL 5 MG PO TABS: 5.0000 mg | ORAL_TABLET | Freq: Once | ORAL | Status: DC | PRN

## 2024-03-19 MED ORDER — LIDOCAINE HCL (CARDIAC) PF 100 MG/5ML IV SOSY
PREFILLED_SYRINGE | INTRAVENOUS | Status: DC | PRN
  Administered 2024-03-19: 40 mg via INTRAVENOUS

## 2024-03-19 MED ORDER — ROCURONIUM BROMIDE 100 MG/10ML IV SOLN
INTRAVENOUS | Status: DC | PRN
  Administered 2024-03-19: 30 mg via INTRAVENOUS

## 2024-03-19 MED ORDER — ONDANSETRON HCL 4 MG/2ML IJ SOLN
INTRAMUSCULAR | Status: DC | PRN
  Administered 2024-03-19: 4 mg via INTRAVENOUS

## 2024-03-19 MED ORDER — SODIUM CHLORIDE 0.9 % IV SOLN
INTRAVENOUS | Status: DC | PRN
  Administered 2024-03-19: 30 mL

## 2024-03-19 MED ORDER — SUGAMMADEX SODIUM 200 MG/2ML IV SOLN
INTRAVENOUS | Status: DC | PRN
  Administered 2024-03-19: 150 mg via INTRAVENOUS

## 2024-03-19 MED ORDER — CIPROFLOXACIN IN D5W 400 MG/200ML IV SOLN
INTRAVENOUS | Status: DC | PRN
  Administered 2024-03-19: 400 mg via INTRAVENOUS

## 2024-03-19 MED ORDER — FENTANYL CITRATE PF 50 MCG/ML IJ SOSY
PREFILLED_SYRINGE | INTRAMUSCULAR | Status: AC
  Filled 2024-03-19: qty 1

## 2024-03-19 MED ORDER — DROPERIDOL 2.5 MG/ML IJ SOLN
INTRAMUSCULAR | Status: AC
  Filled 2024-03-19: qty 2

## 2024-03-19 MED ORDER — DROPERIDOL 2.5 MG/ML IJ SOLN
0.6250 mg | Freq: Once | INTRAMUSCULAR | Status: AC | PRN
  Administered 2024-03-19: 0.625 mg via INTRAVENOUS

## 2024-03-19 MED ORDER — CIPROFLOXACIN IN D5W 400 MG/200ML IV SOLN
INTRAVENOUS | Status: AC
  Filled 2024-03-19: qty 200

## 2024-03-19 MED ORDER — INDOMETHACIN 50 MG RE SUPP
RECTAL | Status: DC | PRN
  Administered 2024-03-19: 100 mg via RECTAL

## 2024-03-19 MED ORDER — SODIUM CHLORIDE 0.9 % IV SOLN: INTRAVENOUS | Status: DC

## 2024-03-19 MED ORDER — PROPOFOL 10 MG/ML IV BOLUS
INTRAVENOUS | Status: AC
  Filled 2024-03-19: qty 20

## 2024-03-19 NOTE — Anesthesia Preprocedure Evaluation (Signed)
 Anesthesia Evaluation  Patient identified by MRN, date of birth, ID band Patient awake    Reviewed: Allergy & Precautions, NPO status , Patient's Chart, lab work & pertinent test results  History of Anesthesia Complications Negative for: history of anesthetic complications  Airway Mallampati: II  TM Distance: >3 FB Neck ROM: Full    Dental  (+) Dental Advisory Given, Edentulous Upper   Pulmonary neg pulmonary ROS   breath sounds clear to auscultation       Cardiovascular hypertension, Pt. on medications (-) Past MI  Rhythm:Regular Rate:Normal     Neuro/Psych  Headaches PSYCHIATRIC DISORDERS  Depression   Dementia Memory loss    GI/Hepatic ,GERD  ,,Cholangiocarcinoma   Endo/Other  negative endocrine ROS    Renal/GU negative Renal ROS     Musculoskeletal negative musculoskeletal ROS (+)    Abdominal   Peds  Hematology  (+) Blood dyscrasia, anemia   Anesthesia Other Findings   Reproductive/Obstetrics                              Anesthesia Physical Anesthesia Plan  ASA: 3  Anesthesia Plan: General   Post-op Pain Management: Minimal or no pain anticipated   Induction: Intravenous  PONV Risk Score and Plan: 3 and Treatment may vary due to age or medical condition, Ondansetron  and TIVA  Airway Management Planned: Oral ETT  Additional Equipment: None  Intra-op Plan:   Post-operative Plan: Extubation in OR  Informed Consent: I have reviewed the patients History and Physical, chart, labs and discussed the procedure including the risks, benefits and alternatives for the proposed anesthesia with the patient or authorized representative who has indicated his/her understanding and acceptance.   Patient has DNR.  Discussed DNR with patient and Suspend DNR.     Plan Discussed with: CRNA and Anesthesiologist  Anesthesia Plan Comments:         Anesthesia Quick Evaluation

## 2024-03-19 NOTE — Interval H&P Note (Signed)
 History and Physical Interval Note:  03/19/2024 3:26 PM  Caroline Snyder  has presented today for surgery, with the diagnosis of cholangiocarcinoma.  The various methods of treatment have been discussed with the patient and family. After consideration of risks, benefits and other options for treatment, the patient has consented to  Procedure(s): ERCP, WITH INTERVENTION IF INDICATED (N/A) as a surgical intervention.  The patient's history has been reviewed, patient examined, no change in status, stable for surgery.  I have reviewed the patient's chart and labs.  Questions were answered to the patient's satisfaction.    The risks of an ERCP were discussed at length, including but not limited to the risk of perforation, bleeding, abdominal pain, post-ERCP pancreatitis (while usually mild can be severe and even life threatening).  For biliary stent exchange which was planned for next month but as patient is inpatient and stable, will help prevent further procedures and allow her to continue her oncologic therapies unhindered in the coming weeks.   Carmelle Bamberg Mansouraty Jr

## 2024-03-19 NOTE — Anesthesia Postprocedure Evaluation (Signed)
 Anesthesia Post Note  Patient: Caroline Snyder  Procedure(s) Performed: ERCP, WITH INTERVENTION IF INDICATED     Patient location during evaluation: PACU Anesthesia Type: General Level of consciousness: awake Pain management: pain level controlled Vital Signs Assessment: post-procedure vital signs reviewed and stable Respiratory status: spontaneous breathing, nonlabored ventilation and respiratory function stable Cardiovascular status: blood pressure returned to baseline and stable Postop Assessment: no apparent nausea or vomiting Anesthetic complications: no   No notable events documented.  Last Vitals:  Vitals:   03/19/24 1836 03/19/24 1940  BP: (!) 185/69 (!) 195/95  Pulse:  77  Resp: 16 18  Temp: 36.6 C (!) 36.3 C  SpO2: 97% 100%    Last Pain:  Vitals:   03/19/24 1940  TempSrc: Oral  PainSc:                  [name]

## 2024-03-19 NOTE — Transfer of Care (Signed)
 Immediate Anesthesia Transfer of Care Note  Patient: Caroline Snyder  Procedure(s) Performed: ERCP, WITH INTERVENTION IF INDICATED  Patient Location: PACU  Anesthesia Type:General  Level of Consciousness: awake, alert , and oriented  Airway & Oxygen Therapy: Patient Spontanous Breathing  Post-op Assessment: Report given to RN and Post -op Vital signs reviewed and stable  Post vital signs: Reviewed  Last Vitals:  Vitals Value Taken Time  BP 201/99 03/19/24 17:12  Temp    Pulse 79 03/19/24 17:15  Resp 13 03/19/24 17:15  SpO2 100 % 03/19/24 17:15  Vitals shown include unfiled device data.  Last Pain:  Vitals:   03/19/24 1530  TempSrc: Temporal  PainSc: 0-No pain         Complications: No notable events documented.

## 2024-03-19 NOTE — Progress Notes (Signed)
 Patient's hemoglobin and white cell count are improved today.  She has been kept n.p.o. after midnight per the nursing staff.  I called and discussed with son, [name].  He received Dr. London message from yesterday and he and family/patient are all in agreement with procedure today for ERCP with stent exchange.  Will be performed by Dr. Wilhelmenia later today.

## 2024-03-19 NOTE — Progress Notes (Signed)
 PROGRESS NOTE    Caroline Snyder  FMW:[id number] DOB: 02-Jan-1941 DOA: 03/16/2024 PCP: [name], NP    Chief Complaint  Patient presents with   Abdominal Pain    Brief Narrative:  Patient 83 year old female history of vascular dementia, primary cholangiocarcinoma over the extrahepatic bile duct currently on chemo and radiation, depression, GERD, hyperlipidemia, hypertension presented to the ED with nausea vomiting diarrhea.  CT abdomen and pelvis done concerning for colitis.  Stool studies ordered and pending.  Patient placed empirically on IV antibiotics.  GI consulted for further evaluation and management.   Assessment & Plan:   Principal Problem:   Colitis Active Problems:   Hyperlipidemia   Essential hypertension   Nausea and vomiting   Vascular dementia (HCC)   Cholangiocarcinoma (HCC)   Anemia   Pressure injury of skin   Hypomagnesemia   Hypophosphatemia   Hypokalemia  #1 acute colitis/ileitis -Patient presented with nausea and vomiting diarrhea. - Differential includes infectious versus inflammatory versus ischemic colitis. - CT abdomen and pelvis obtained with colonic wall thickening and pericolonic edema extending from the cecum through the transverse colon and also involving the sigmoid colon consistent with colitis of infectious, inflammatory or ischemic etiology.  Fluid-filled small bowel in the right lower quadrant with mild mucosal enhancement of terminal ileum consistent with ileitis.  Common bile duct stent in place with pneumobilia.  No biliary distention.  Indistinct hazy soft tissue density at the porta hepatis, surrounding common bile duct stent and now with interval focal narrowed appearance of the portal vein and presumably related to history of known bile duct malignancy.  No evidence of portal vein thrombosis or occlusion.  Hepatic steatosis.  Aortic atherosclerosis. - GI pathogen panel by PCR negative. - C. difficile by PCR negative. - Patient  afebrile. - Continue empiric antibiotics of IV Rocephin , IV Flagyl . - IV antiemetics, IV fluids, supportive care. -Discontinue enteric precautions. - Patient seen in consultation by GI who are in agreement with current antibiotics and awaiting stool studies. - GI also recommending holding capecitabine  until symptoms are resolved and may need to consider dose reduction.  2.  Cholangiocarcinoma -Patient currently undergoing chemotherapy and radiation treatment. - Continue to hold capecitabine  until resolution of problem #1 per GI recommendations. - Biliary stent noted to be in place on CT abdomen and pelvis. - Per GI, Dr. Wilhelmenia will be able to perform stent exchange today while inpatient.  - GI following.  -Patient's oncologist informed of admission via epic. -Radiation oncology, Dr. Dewey informed of patient's admission via secure chat and recommended to monitor the patient for another 24 hours and if continued improvement may consider radiation treatment tomorrow. - Outpatient follow-up with oncology.  3.  Anemia -Felt likely secondary to hemodilution and chemotherapy. - Patient with no overt bleeding. - Anemia panel with iron level of 45, TIBC of 237, ferritin of 86, folate of 16.8, vitamin B12 328. -Hemoglobin currently at 9.4 from 7.9 from 10.6 on admission. - Follow H&H. - Transfusion threshold hemoglobin < 7. - GI following and at this time no plans for endoscopic evaluation for bleeding however per GI patient for ERCP today for stent exchange and at time of ERCP today, upper GI bleeding sources can be evaluated at the same time. - Per GI.  4.  Vascular dementia -Delirium precautions.  5.  Hyperlipidemia - Continue to hold Crestor  during the hospitalization and resume on discharge.   6.  Hypertension - Patient started on lower dose of home regimen Cozaar  and  will increase dose to 50 mg daily.   - Could uptitrate Cozaar  back to home dose of 100 mg daily.  - IV hydralazine   as needed.  7.  Hypokalemia/hypomagnesemia/hypophosphatemia - Likely secondary to GI losses.   -Potassium at 4.0. - Magnesium  at 2.4. - Phosphorus of 2.4. - Continue K-Phos 250 mg p.o. twice daily x 3 days. - Repeat labs in the AM.   DVT prophylaxis: SCDs Code Status: DNR Family Communication: Updated patient.  No family at bedside. Disposition: TBD  Status is: Inpatient Remains inpatient appropriate because: Severity of illness   Consultants:  Gastroenterology: Dr. Stacia 03/17/2024  Procedures:  CT abdomen and pelvis 03/16/2024   Antimicrobials:  Anti-infectives (From admission, onward)    Start     Dose/Rate Route Frequency Ordered Stop   03/17/24 1000  cefTRIAXone  (ROCEPHIN ) 2 g in sodium chloride  0.9 % 100 mL IVPB        2 g 200 mL/hr over 30 Minutes Intravenous Every 24 hours 03/17/24 0105     03/17/24 1000  metroNIDAZOLE  (FLAGYL ) IVPB 500 mg        500 mg 100 mL/hr over 60 Minutes Intravenous Every 12 hours 03/17/24 0105     03/16/24 2245  ciprofloxacin  (CIPRO ) IVPB 400 mg        400 mg 200 mL/hr over 60 Minutes Intravenous  Once 03/16/24 2244 03/17/24 0003   03/16/24 2245  metroNIDAZOLE  (FLAGYL ) IVPB 500 mg        500 mg 100 mL/hr over 60 Minutes Intravenous  Once 03/16/24 2244 03/17/24 0150         Subjective: Patient sitting up in bed.  OT at bedside.  Patient denies any chest pain or shortness of breath.  Patient denies any significant abdominal pain.  Denies any nausea or emesis.  States had loose stools this morning.  Objective: Vitals:   03/18/24 0552 03/18/24 1433 03/18/24 2022 03/19/24 0436  BP: (!) 154/82 (!) 141/70 (!) 153/67 (!) 161/72  Pulse: 82 69 74 66  Resp: 16 19 18 17   Temp: 98 F (36.7 C) (!) 97.5 F (36.4 C) 98 F (36.7 C) 97.9 F (36.6 C)  TempSrc: Oral Oral Oral Oral  SpO2: 99% 99% 99% 99%  Weight:      Height:        Intake/Output Summary (Last 24 hours) at 03/19/2024 1017 Last data filed at 03/19/2024 0913 Gross per  24 hour  Intake 880.55 ml  Output 300 ml  Net 580.55 ml   Filed Weights   03/17/24 0136  Weight: 44.4 kg    Examination:  General exam: NAD.   Respiratory system: Lungs clear to auscultation bilaterally.  No wheezes, no crackles, no rhonchi.  Fair air movement.  Speaking in full sentences.   Cardiovascular system: Regular rate rhythm no murmurs rubs or gallops.  No JVD.  No pitting lower extremity edema.  Gastrointestinal system: Abdomen is soft, nondistended, decreased diffuse tenderness to palpation.  Positive bowel sounds.  No rebound.  No guarding.  Central nervous system: Alert and oriented.  Moving extremity spontaneously no focal neurological deficits. Extremities: Symmetric 5 x 5 power. Skin: No rashes, lesions or ulcers Psychiatry: Judgement and insight appear poor to fair. Mood & affect appropriate.     Data Reviewed: I have personally reviewed following labs and imaging studies  CBC: Recent Labs  Lab 03/16/24 2007 03/17/24 0125 03/18/24 0642 03/19/24 0815  WBC 6.7 5.2 2.3* 3.4*  NEUTROABS 5.4  --   --  2.5  HGB 10.6* 9.3* 7.9* 9.4*  HCT 31.4* 28.6* 23.8* 28.7*  MCV 93.7 96.0 95.2 93.5  PLT 189 136* 119* 149*    Basic Metabolic Panel: Recent Labs  Lab 03/16/24 2007 03/17/24 0125 03/18/24 0642 03/19/24 0815  NA 136 135 138 139  K 3.9 4.1 3.1* 4.0  CL 103 105 113* 112*  CO2 22 22 21* 20*  GLUCOSE 147* 127* 93 104*  BUN 17 14 6* <5*  CREATININE 0.95 0.84 0.68 0.66  CALCIUM  9.1 8.2* 7.2* 8.2*  MG  --  1.8 1.4* 2.4  PHOS  --   --  2.3* 2.4*    GFR: Estimated Creatinine Clearance: 36.7 mL/min (by C-G formula based on SCr of 0.66 mg/dL).  Liver Function Tests: Recent Labs  Lab 03/16/24 2007 03/18/24 9357 03/19/24 0815  AST 24  --   --   ALT 13  --   --   ALKPHOS 39  --   --   BILITOT 0.6  --   --   PROT 6.3*  --   --   ALBUMIN 3.6 2.5* 3.2*    CBG: No results for input(s): GLUCAP in the last 168 hours.   Recent Results (from the  past 240 hours)  C Difficile Quick Screen w PCR reflex     Status: None   Collection Time: 03/17/24 12:37 AM   Specimen: Stool  Result Value Ref Range Status   C Diff antigen NEGATIVE NEGATIVE Final   C Diff toxin NEGATIVE NEGATIVE Final   C Diff interpretation No C. difficile detected.  Final    Comment: Performed at [facility], [address] [place], [place] [address]  Gastrointestinal Panel by PCR , Stool     Status: None   Collection Time: 03/18/24 12:37 AM   Specimen: Stool  Result Value Ref Range Status   Campylobacter species NOT DETECTED NOT DETECTED Final   Plesimonas shigelloides NOT DETECTED NOT DETECTED Final   Salmonella species NOT DETECTED NOT DETECTED Final   Yersinia enterocolitica NOT DETECTED NOT DETECTED Final   Vibrio species NOT DETECTED NOT DETECTED Final   Vibrio cholerae NOT DETECTED NOT DETECTED Final   Enteroaggregative E coli (EAEC) NOT DETECTED NOT DETECTED Final   Enteropathogenic E coli (EPEC) NOT DETECTED NOT DETECTED Final   Enterotoxigenic E coli (ETEC) NOT DETECTED NOT DETECTED Final   Shiga like toxin producing E coli (STEC) NOT DETECTED NOT DETECTED Final   Shigella/Enteroinvasive E coli (EIEC) NOT DETECTED NOT DETECTED Final   Cryptosporidium NOT DETECTED NOT DETECTED Final   Cyclospora cayetanensis NOT DETECTED NOT DETECTED Final   Entamoeba histolytica NOT DETECTED NOT DETECTED Final   Giardia lamblia NOT DETECTED NOT DETECTED Final   Adenovirus F40/41 NOT DETECTED NOT DETECTED Final   Astrovirus NOT DETECTED NOT DETECTED Final   Norovirus GI/GII NOT DETECTED NOT DETECTED Final   Rotavirus A NOT DETECTED NOT DETECTED Final   Sapovirus (I, II, IV, and V) NOT DETECTED NOT DETECTED Final    Comment: Performed at [facility], [address] [place], [place] [address]         Radiology Studies: No results found.       Scheduled Meds:  aspirin  EC  81 mg Oral Daily   feeding supplement  237 mL Oral  BID BM   losartan   50 mg Oral Daily   phosphorus  250 mg Oral BID   Continuous Infusions:  cefTRIAXone  (ROCEPHIN )  IV 2 g (03/19/24 0952)  famotidine  (PEPCID ) IV Stopped (03/18/24 1845)   metronidazole  Stopped (03/18/24 2223)     LOS: 3 days    Time spent: 35 minutes    Toribio Hummer, MD [facility]   To contact the attending provider between 7A-7P or the covering provider during after hours 7P-7A, please log into the web site [email or web address] and access using universal [place] password for that web site. If you do not have the password, please call the hospital operator.  03/19/2024, 10:17 AM

## 2024-03-19 NOTE — Progress Notes (Signed)
 Occupational Therapy Treatment Patient Details Name: Caroline Snyder MRN: [id number] DOB: Sep 26, 1940 Today's Date: 03/19/2024   History of present illness Patient is a 83 yo female presenting to the ED with nausea, vomiting, and diarrhea on 03/16/24. Admitted with colitis. PMH includes: vascular dementia, primary cholangiocarcinoma of extrahepatic bile duct currently being treated with chemo and radiation, depression, GERD, hyperlipidemia, hypertension, migraine headaches, osteoporosis   OT comments  Pt received in bed, alert only to self. Pt completes STS and step pivot transfer to [facility] with CGA, LB dressing, toileting hygiene and clothing mgmt with CGA-supervision, and stands at sink without LOB for grooming tasks. Pt jokes around frequently, has limited attention for participation in the SLUMS assessment.    [facility] Mental Status Examination Orientation: 1/3  Calculations: 0/3 Naming animals: 1/3 Patient named 7 animals (0 points is 0-4 animals; 1 is 5-9 animals; 2 is 10-14 animals; 3 is 15+ animals) Recall: 0/5  Attention: 0/2 Clock drawing: 0/4 Visual Processing: 0/2 Paragraph Memory: 0/8  Total: 2/30; this score falls in the dementia range.   Of note, it is not within occupational therapy scope of practice to diagnose cognitive impairments, this screen indicates need for further testing. The SLUMS is a 30 point, 11 question screening questionnaire that tests orientation, memory, attention, and executive function. Pt with noted impairments in short term memory, problem solving, and executive function limiting ability to safety perform IADLs such as driving, med + financial mgmt, meal prep and grocery shopping.  Due to deficits mentioned above and below in flowsheet, highly recommend pt refrain from driving upon hospital discharge and recommending direct supervision + assist for higher level IADLs such as med mgmt.       If plan is discharge home, recommend the  following:  Supervision due to cognitive status;Direct supervision/assist for medications management;Direct supervision/assist for financial management;Assistance with cooking/housework;A little help with walking and/or transfers;A little help with bathing/dressing/bathroom   Equipment Recommendations  None recommended by OT    Recommendations for Other Services      Precautions / Restrictions Precautions Precautions: Fall Recall of Precautions/Restrictions: Impaired Restrictions Weight Bearing Restrictions Per Provider Order: No       Mobility Bed Mobility Overal bed mobility: Needs Assistance Bed Mobility: Supine to Sit     Supine to sit: Supervision          Transfers Overall transfer level: Needs assistance Equipment used: 1 person hand held assist Transfers: Sit to/from Stand, Bed to chair/wheelchair/BSC Sit to Stand: Contact guard assist           General transfer comment: pt completes SPT from bed > BSC     Balance Overall balance assessment: Needs assistance Sitting-balance support: Feet supported, No upper extremity supported Sitting balance-Leahy Scale: Good     Standing balance support: Single extremity supported, During functional activity Standing balance-Leahy Scale: Fair                             ADL either performed or assessed with clinical judgement   ADL Overall ADL's : Needs assistance/impaired     Grooming: Wash/dry hands;Standing;Supervision/safety Grooming Details (indicate cue type and reason): sink level             Lower Body Dressing: Supervision/safety;Sit to/from stand Lower Body Dressing Details (indicate cue type and reason): dons underwear seated on [facility] Toilet Transfer: Supervision/safety;Contact guard assist;BSC/3in1   Toileting- Clothing Manipulation and Hygiene: Contact guard assist;Sitting/lateral lean;Sit to/from stand  Communication Communication Communication: No apparent  difficulties   Cognition Arousal: Alert Behavior During Therapy: WFL for tasks assessed/performed Cognition: Cognition impaired, History of cognitive impairments   Orientation impairments: Time, Place, Situation Awareness: Intellectual awareness intact, Online awareness intact Memory impairment (select all impairments): Short-term memory, Declarative long-term memory Attention impairment (select first level of impairment): Focused attention Executive functioning impairment (select all impairments): Sequencing, Problem solving, Organization OT - Cognition Comments: Per chart, history of vascular dementia, likes to joke, participated in [place] cognitive assessment                 Following commands: Impaired Following commands impaired: Only follows one step commands consistently, Follows multi-step commands with increased time, Follows multi-step commands inconsistently      Cueing   Cueing Techniques: Verbal cues, Gestural cues  Exercises Exercises: Other exercises Other Exercises Other Exercises: SLUMS examination performed 2 out of 30            Pertinent Vitals/ Pain       Pain Assessment Pain Assessment: No/denies pain   Frequency  Min 3X/week        Progress Toward Goals  OT Goals(current goals can now be found in the care plan section)  Progress towards OT goals: Progressing toward goals  Acute Rehab OT Goals OT Goal Formulation: With patient Time For Goal Achievement: 04/01/24 Potential to Achieve Goals: Good ADL Goals Pt Will Perform Lower Body Bathing: with modified independence;sitting/lateral leans;sit to/from stand Pt Will Perform Lower Body Dressing: with modified independence;sit to/from stand;sitting/lateral leans Pt Will Transfer to Toilet: with modified independence;ambulating;regular height toilet Pt Will Perform Toileting - Clothing Manipulation and hygiene: with modified independence;sitting/lateral leans;sit to/from stand Additional ADL  Goal #1: Patient will complete an upper level cognitive task without cues or assist in order to be able to return home alone safely. Additional ADL Goal #2: Patient will be able to complete functional task in standing for 5 minutes without seated rest break in order to promote increased activity tolerance.  Plan         AM-PAC OT 6 Clicks Daily Activity     Outcome Measure   Help from another person eating meals?: None Help from another person taking care of personal grooming?: A Little Help from another person toileting, which includes using toliet, bedpan, or urinal?: A Little Help from another person bathing (including washing, rinsing, drying)?: A Little Help from another person to put on and taking off regular upper body clothing?: A Little Help from another person to put on and taking off regular lower body clothing?: A Little 6 Click Score: 19    End of Session    OT Visit Diagnosis: Muscle weakness (generalized) (M62.81);Other symptoms and signs involving cognitive function   Activity Tolerance Patient tolerated treatment well   Patient Left in bed;with call bell/phone within reach;with bed alarm set   Nurse Communication Mobility status (BM on [facility])        Time: 9046-8978 OT Time Calculation (min): 28 min  Charges: OT General Charges $OT Visit: 1 Visit OT Treatments $Self Care/Home Management : 23-37 mins  Robynn Marcel L. Daphne Karrer, OTR/L  03/19/24, 12:57 PM

## 2024-03-19 NOTE — Anesthesia Procedure Notes (Signed)
 Procedure Name: Intubation Date/Time: 03/19/2024 4:28 PM  Performed by: Gladis Honey, CRNAPre-anesthesia Checklist: Patient identified, Emergency Drugs available, Suction available and Patient being monitored Patient Re-evaluated:Patient Re-evaluated prior to induction Oxygen Delivery Method: Circle System Utilized Preoxygenation: Pre-oxygenation with 100% oxygen Induction Type: IV induction Ventilation: Mask ventilation without difficulty Laryngoscope Size: Miller and 2 Grade View: Grade II Tube type: Oral Tube size: 6.5 mm Number of attempts: 1 Airway Equipment and Method: Stylet and Oral airway Placement Confirmation: ETT inserted through vocal cords under direct vision, positive ETCO2 and breath sounds checked- equal and bilateral Tube secured with: Tape Dental Injury: Teeth and Oropharynx as per pre-operative assessment

## 2024-03-19 NOTE — Op Note (Signed)
 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 Estimated Blood Loss:     Estimated blood loss was minimal. Procedure:                Pre-Anesthesia Assessment:                           - Prior to the procedure, a History and Physical                            was performed, and patient medications and                            allergies were reviewed. The patient's tolerance of                            previous anesthesia was also reviewed. The risks                            and benefits of the procedure and the sedation                            options and risks were discussed with the patient.                            All questions were answered, and informed consent                            was obtained. Prior Anticoagulants: The patient has                            taken no anticoagulant or antiplatelet agents. ASA  Grade Assessment: III - A patient with severe                            systemic disease. After reviewing the risks and                            benefits, the patient was deemed in satisfactory                            condition to undergo the procedure.                           After obtaining informed consent, the scope was                            passed under  direct vision. Throughout the                            procedure, the patient's blood pressure, pulse, and                            oxygen saturations were monitored continuously. The                            [employer] D single use                            duodenoscope was introduced through the mouth, and                            used to inject contrast into and used to inject                            contrast into the bile duct. The ERCP was                            accomplished without difficulty. The patient                            tolerated the procedure. Scope In: Scope Out: Findings:      A biliary stent was visible on the scout film.      The esophagus was successfully intubated under direct vision without       detailed examination of the pharynx, larynx, and associated structures,       and upper GI tract. A biliary fistulotomy had been performed. The       sphincterotomy appeared open. One plastic biliary stent originating in       the biliary tree was emerging from the fistulotomy site. The stent was       visibly patent. One stent was removed from the biliary tree using a       snare.      A short 0.025 inch revolution Raymon was passed into the biliary tree.       The short-nosed traction sphincterotome was passed over the guidewire  and the bile duct was then deeply cannulated. Contrast was injected. I       personally interpreted the bile duct images. Ductal flow of contrast was       adequate. Image quality was adequate. Contrast extended to the hepatic       ducts. Opacification of the main bile duct was successful. The maximum       diameter of the ducts was 9 mm. The middle third of the main bile duct       contained a single moderate stenosis 10 mm in length. To discover       objects, the biliary tree was swept with a retrieval balloon. Sludge and       biliary debris was swept from the duct. An occlusion cholangiogram was        performed that showed no further significant biliary pathology other       than the noted stenosis. As patient still has her gallbladder, decision       was made to place an uncovered stent. One 10 mm by 6 cm uncovered metal       biliary stent was placed into the common bile duct. The stent was in       good position.      A pancreatogram was not performed.      The duodenoscope was withdrawn from the patient. Impression:               - Prior biliary fistulotomy appeared open.                           - One visibly patent stent from the biliary tree                            was seen. This was removed.                           - A single moderate biliary stricture was found in                            the middle third of the main bile duct. The                            stricture was malignant appearing (previously                            biopsied).                           - The biliary tree was swept and sludge was found.                           - One uncovered metal biliary stent was placed into                            the common bile duct to traverse the stricture                            (this is noted a few millimeters below the  bifurcation in regards to its proximal aspect). Moderate Sedation:      Not Applicable - Patient had care per Anesthesia. Recommendation:           - The patient will be observed post-procedure,                            until all discharge criteria are met.                           - Return patient to hospital ward for ongoing care.                           - Observe patient's clinical course.                           - Watch for pancreatitis, bleeding, perforation,                            and cholangitis.                           - Check liver enzymes (AST, ALT, alkaline                            phosphatase, bilirubin) in the morning.                           - Will cancel her procedure  scheduled for September                            as she would no longer need.                           - The findings and recommendations were discussed                            with the patient.                           - The findings and recommendations were discussed                            with the referring physician. Procedure Code(s):        --- Professional ---                           [phone], Endoscopic retrograde                            cholangiopancreatography (ERCP); with removal and                            exchange of stent(s), biliary or pancreatic duct,                            including pre- and post-dilation and guide  wire                            passage, when performed, including sphincterotomy,                            when performed, each stent exchanged                           [id number], Endoscopic retrograde                            cholangiopancreatography (ERCP); with removal of                            calculi/debris from biliary/pancreatic duct(s)                           [phone], Endoscopic catheterization of the biliary                            ductal system, radiological supervision and                            interpretation Diagnosis Code(s):        --- Professional ---                           Z96.89, Presence of other specified functional                            implants                           K83.1, Obstruction of bile duct                           Z46.59, Encounter for fitting and adjustment of                            other gastrointestinal appliance and device                           C24.0, Malignant neoplasm of extrahepatic bile duct                           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 All rights reserved. The codes documented in this report are preliminary and upon coder review may  be revised to meet current compliance requirements. Aloha Finner, MD 03/19/2024 5:00:33 PM Number of Addenda: 0

## 2024-03-20 ENCOUNTER — Ambulatory Visit
Admission: RE | Admit: 2024-03-20 | Discharge: 2024-03-20 | Discharge status: Home / Self Care | Payer: Medicare (Managed Care) | Source: Ambulatory Visit | Attending: Radiation Oncology

## 2024-03-20 ENCOUNTER — Other Ambulatory Visit: Payer: Self-pay

## 2024-03-20 ENCOUNTER — Ambulatory Visit: Payer: Medicare (Managed Care)

## 2024-03-20 DIAGNOSIS — D649 Anemia, unspecified: Secondary | ICD-10-CM | POA: Diagnosis not present

## 2024-03-20 DIAGNOSIS — I10 Essential (primary) hypertension: Secondary | ICD-10-CM | POA: Diagnosis not present

## 2024-03-20 DIAGNOSIS — C221 Intrahepatic bile duct carcinoma: Secondary | ICD-10-CM | POA: Diagnosis not present

## 2024-03-20 DIAGNOSIS — K529 Noninfective gastroenteritis and colitis, unspecified: Secondary | ICD-10-CM | POA: Diagnosis not present

## 2024-03-20 LAB — RAD ONC ARIA SESSION SUMMARY
Course Elapsed Days: 36
Plan Fractions Treated to Date: 1
Plan Prescribed Dose Per Fraction: 1.8 Gy
Plan Total Fractions Prescribed: 3
Plan Total Prescribed Dose: 5.4 Gy
Reference Point Dosage Given to Date: 1.8 Gy
Reference Point Session Dosage Given: 1.8 Gy
Session Number: 26

## 2024-03-20 LAB — CBC WITH DIFFERENTIAL/PLATELET
Abs Immature Granulocytes: 0.03 K/uL (ref 0.00–0.07)
Basophils Absolute: 0 K/uL (ref 0.0–0.1)
Basophils Relative: 0 %
Eosinophils Absolute: 0.1 K/uL (ref 0.0–0.5)
Eosinophils Relative: 2 %
HCT: 28 % — ABNORMAL LOW (ref 36.0–46.0)
Hemoglobin: 9.6 g/dL — ABNORMAL LOW (ref 12.0–15.0)
Immature Granulocytes: 1 %
Lymphocytes Relative: 5 %
Lymphs Abs: 0.3 K/uL — ABNORMAL LOW (ref 0.7–4.0)
MCH: 31.7 pg (ref 26.0–34.0)
MCHC: 34.3 g/dL (ref 30.0–36.0)
MCV: 92.4 fL (ref 80.0–100.0)
Monocytes Absolute: 0.5 K/uL (ref 0.1–1.0)
Monocytes Relative: 10 %
Neutro Abs: 4.4 K/uL (ref 1.7–7.7)
Neutrophils Relative %: 82 %
Platelets: 158 K/uL (ref 150–400)
RBC: 3.03 MIL/uL — ABNORMAL LOW (ref 3.87–5.11)
RDW: 14.9 % (ref 11.5–15.5)
WBC: 5.4 K/uL (ref 4.0–10.5)
nRBC: 0 % (ref 0.0–0.2)

## 2024-03-20 LAB — RENAL FUNCTION PANEL
Albumin: 3.1 g/dL — ABNORMAL LOW (ref 3.5–5.0)
Anion gap: 10 (ref 5–15)
BUN: <5 mg/dL — ABNORMAL LOW (ref 8–23)
CO2: 20 mmol/L — ABNORMAL LOW (ref 22–32)
Calcium: 8.5 mg/dL — ABNORMAL LOW (ref 8.9–10.3)
Chloride: 108 mmol/L (ref 98–111)
Creatinine, Ser: 0.59 mg/dL (ref 0.44–1.00)
GFR, Estimated: >60 mL/min (ref >60)
Glucose, Bld: 121 mg/dL — ABNORMAL HIGH (ref 70–99)
Phosphorus: 2.3 mg/dL — ABNORMAL LOW (ref 2.5–4.6)
Potassium: 3.5 mmol/L (ref 3.5–5.1)
Sodium: 138 mmol/L (ref 135–145)

## 2024-03-20 LAB — HEPATIC FUNCTION PANEL
ALT: 18 U/L (ref 0–44)
AST: 35 U/L (ref 15–41)
Albumin: 3.1 g/dL — ABNORMAL LOW (ref 3.5–5.0)
Alkaline Phosphatase: 36 U/L — ABNORMAL LOW (ref 38–126)
Bilirubin, Direct: <0.1 mg/dL (ref 0.0–0.2)
Total Bilirubin: 0.3 mg/dL (ref 0.0–1.2)
Total Protein: 5.4 g/dL — ABNORMAL LOW (ref 6.5–8.1)

## 2024-03-20 LAB — MAGNESIUM: Magnesium: 1.8 mg/dL (ref 1.7–2.4)

## 2024-03-20 MED ORDER — LOSARTAN POTASSIUM 50 MG PO TABS
50.0000 mg | ORAL_TABLET | Freq: Once | ORAL | Status: AC
  Administered 2024-03-20: 50 mg via ORAL
  Filled 2024-03-20: qty 1

## 2024-03-20 MED ORDER — LOSARTAN POTASSIUM 50 MG PO TABS
100.0000 mg | ORAL_TABLET | Freq: Every day | ORAL | Status: DC
  Administered 2024-03-21 – 2024-03-22 (×2): 100 mg via ORAL
  Filled 2024-03-20 (×2): qty 2

## 2024-03-20 MED ORDER — MORPHINE SULFATE (PF) 2 MG/ML IV SOLN
0.5000 mg | INTRAVENOUS | Status: DC | PRN
  Administered 2024-03-20: 0.5 mg via INTRAVENOUS
  Administered 2024-03-20 – 2024-03-21 (×3): 1 mg via INTRAVENOUS
  Administered 2024-03-22: 0.5 mg via INTRAVENOUS
  Administered 2024-03-22: 1 mg via INTRAVENOUS
  Filled 2024-03-20 (×6): qty 1

## 2024-03-20 MED ORDER — POTASSIUM CHLORIDE 10 MEQ/100ML IV SOLN
10.0000 meq | INTRAVENOUS | Status: DC
  Administered 2024-03-20: 10 meq via INTRAVENOUS
  Filled 2024-03-20: qty 100

## 2024-03-20 MED ORDER — POTASSIUM CHLORIDE CRYS ER 20 MEQ PO TBCR
30.0000 meq | EXTENDED_RELEASE_TABLET | Freq: Once | ORAL | Status: AC
  Administered 2024-03-20: 30 meq via ORAL
  Filled 2024-03-20: qty 1

## 2024-03-20 NOTE — Plan of Care (Signed)
   Problem: Clinical Measurements: Goal: Will remain free from infection Outcome: Progressing   Problem: Clinical Measurements: Goal: Ability to maintain clinical measurements within normal limits will improve Outcome: Progressing

## 2024-03-20 NOTE — Progress Notes (Signed)
     [facility] Progress Note  CC:  Diarrhea/colitis  Subjective:  Diarrhea still present a little, but definitely better.  No abdominal pain.  Just complaining of back pain.  Had a good visit with her brother and son today,  Objective:  Vital signs in last 24 hours: Temp:  [97.3 F (36.3 C)-98.8 F (37.1 C)] 98.8 F (37.1 C) (08/19 0437) Pulse Rate:  [66-88] 79 (08/19 0437) Resp:  [10-18] 14 (08/19 0437) BP: (153-201)/(69-98) 176/83 (08/19 0437) SpO2:  [97 %-100 %] 97 % (08/19 0437) Weight:  [44.4 kg] 44.4 kg (08/18 1530) Last BM Date : 03/19/24 General:  Alert, Well-developed, in NAD Heart:  Regular rate and rhythm; no murmurs Pulm:  CTAB.  No W/R/R. Abdomen:  Soft, non-distended.  BS present.  Non-tender.  Extremities:  Without edema. Neurologic:  Alert and oriented x 4;  grossly normal neurologically. Psych:  Alert and cooperative. Normal mood and affect.  Intake/Output from previous day: 08/18 0701 - 08/19 0700 In: 1776 [P.O.:350; I.V.:976; IV Piggyback:449.9] Out: 0  Intake/Output this shift: Total I/O In: -  Out: 850 [Urine:850]  Lab Results: Recent Labs    03/18/24 0642 03/19/24 0815 03/20/24 0640  WBC 2.3* 3.4* 5.4  HGB 7.9* 9.4* 9.6*  HCT 23.8* 28.7* 28.0*  PLT 119* 149* 158   BMET Recent Labs    03/18/24 0642 03/19/24 0815 03/20/24 0640  NA 138 139 138  K 3.1* 4.0 3.5  CL 113* 112* 108  CO2 21* 20* 20*  GLUCOSE 93 104* 121*  BUN 6* <5* <5*  CREATININE 0.68 0.66 0.59  CALCIUM  7.2* 8.2* 8.5*   LFT Recent Labs    03/20/24 0640  PROT 5.4*  ALBUMIN 3.1*  3.1*  AST 35  ALT 18  ALKPHOS 36*  BILITOT 0.3  BILIDIR <0.1  IBILI NOT CALCULATED   Assessment / Plan: 83 year old female with recently diagnosed cholangiocarcinoma, currently undergoing treatment with radiation and Xeloda , admitted with 2 to 3 weeks of nausea, vomiting and poor p.o. intake, also with 2 days of nonbloody diarrhea, with CT findings of  ileocolitis. Clinical presentation most consistent with either capecitabine  induced colitis vs infectious ileocolitis.   Acute colitis, nausea/vomiting - Stool studies negative, would stop abx. - Would hold capecitabine  until symptoms resolve; consider dose reduction, defer to oncology - Continue supportive care with IV hydration, antiemetics; consider scheduled Reglan  if p.o. intake not improving by the end of the day - Advance diet as tolerated. - No plans for endoscopic evaluation at this time.   Cholangiocarcinoma - Currently undergoing treatment as above - Hold capecitabine  as above - Biliary stent in place, with good bile drainage - Repeat ERCP 8/18:  - Prior biliary fistulotomy appeared open. - One visibly patent stent from the biliary tree was seen. This was removed. - A single moderate biliary stricture was found in the middle third of the main bile duct. The stricture was malignant appearing ( previously biopsied) . - The biliary tree was swept and sludge was found. - One uncovered metal biliary stent was placed into the common bile duct to traverse the stricture ( this is noted a few millimeters below the bifurcation in regards to its proximal aspect) . --LFTs normal today.   Anemia - Counts improving.    LOS: 4 days   Caroline Snyder. Caroline Snyder  03/20/2024, 12:41 PM

## 2024-03-20 NOTE — Progress Notes (Signed)
 PT Cancellation Note  Patient Details Name: Caroline Snyder MRN: [id number] DOB: 03-22-1941   Cancelled Treatment:    Reason Eval/Treat Not Completed: Other (comment). Pt with nausea/vomiting this morning and now asleep. Will continue to check back as schedule permits.    Metta Ave PT, DPT 03/20/24, 2:27 PM

## 2024-03-20 NOTE — Progress Notes (Signed)
 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  CT abdomen and pelvis done concerning for colitis.  Stool studies ordered and pending.  Patient placed empirically on IV antibiotics.  GI consulted for further evaluation and management.   Assessment & Plan:   Principal Problem:   Colitis Active Problems:   Hyperlipidemia   Essential hypertension   Nausea and vomiting   Vascular dementia (HCC)   Cholangiocarcinoma (HCC)   Anemia   Pressure injury of skin   Hypomagnesemia   Hypophosphatemia   Hypokalemia  #1 acute colitis/ileitis -Patient presented with nausea and vomiting diarrhea. - Differential includes infectious versus inflammatory versus ischemic colitis. - CT abdomen and pelvis obtained with colonic wall thickening and pericolonic edema extending from the cecum through the transverse colon and also involving the sigmoid colon consistent with colitis of infectious, inflammatory or ischemic etiology.  Fluid-filled small bowel in the right lower quadrant with mild mucosal enhancement of terminal ileum consistent with ileitis.  Common bile duct stent in place with pneumobilia.  No biliary distention.  Indistinct hazy soft tissue density at the porta hepatis, surrounding common bile duct stent and now with interval focal narrowed appearance of the portal vein and presumably related to history of known bile duct malignancy.  No evidence of portal vein thrombosis or occlusion.  Hepatic steatosis.  Aortic atherosclerosis. - GI pathogen panel by PCR negative. - C. difficile by PCR negative. - Patient  afebrile. - Continue empiric antibiotics of IV Rocephin , IV Flagyl . - IV antiemetics, IV fluids, supportive care. -Discontinued enteric precautions. - Patient seen in consultation by GI who are in agreement with current antibiotics and awaiting stool studies. - GI also recommending holding capecitabine  until symptoms are resolved and may need to consider dose reduction.  2.  Cholangiocarcinoma -Patient currently undergoing chemotherapy and radiation treatment. - Continue to hold capecitabine  until resolution of problem #1 per GI recommendations. - Biliary stent noted to be in place on CT abdomen and pelvis. - Patient's status post ERCP with biliary stent exchange per Dr. Wilhelmenia 03/19/2024.  - GI following.  -Patient's oncologist informed of admission via epic. -Radiation oncology, Dr. Dewey informed of patient's admission via secure chat and recommended to monitor the patient for another 24 hours and if continued improvement may consider radiation treatment while in house. - Outpatient follow-up with oncology.  3.  Anemia -Felt likely secondary to hemodilution and chemotherapy. - Patient with no overt bleeding. - Anemia panel with iron level of 45, TIBC of 237, ferritin of 86, folate of 16.8, vitamin B12 328. -Hemoglobin currently at 9.6.  9.4 from 7.9 from 10.6 on admission. - Follow H&H. - Transfusion threshold hemoglobin < 7. - GI following and at this time no plans for endoscopic evaluation for bleeding. - Status post ERCP 03/19/2024.  - Per GI.  4.  Vascular dementia -Delirium precautions.  5.  Hyperlipidemia - Continue to hold Crestor  during the hospitalization and resume on discharge.   6.  Hypertension - Increase Cozaar  back to home regimen of 100 mg daily.   - IV hydralazine  as needed.   7.  Hypokalemia/hypomagnesemia/hypophosphatemia - Likely secondary to GI losses.   -  Potassium at 3.5. - Magnesium  at 1.8. - Phosphorus of 2.3. - Continue K-Phos 250 mg p.o. twice  daily x 3 days. -KCl 10 mill equivalents IV every hour x 4 runs. - Repeat labs in the AM.   DVT prophylaxis: SCDs Code Status: DNR Family Communication: Updated patient.  No family at bedside. Disposition: TBD  Status is: Inpatient Remains inpatient appropriate because: Severity of illness   Consultants:  Gastroenterology: Dr. Stacia 03/17/2024  Procedures:  CT abdomen and pelvis 03/16/2024 ERCP with biliary stent exchange 03/19/2024 per Dr. Wilhelmenia  Antimicrobials:  Anti-infectives (From admission, onward)    Start     Dose/Rate Route Frequency Ordered Stop   03/17/24 1000  cefTRIAXone  (ROCEPHIN ) 2 g in sodium chloride  0.9 % 100 mL IVPB        2 g 200 mL/hr over 30 Minutes Intravenous Every 24 hours 03/17/24 0105     03/17/24 1000  metroNIDAZOLE  (FLAGYL ) IVPB 500 mg        500 mg 100 mL/hr over 60 Minutes Intravenous Every 12 hours 03/17/24 0105     03/16/24 2245  ciprofloxacin  (CIPRO ) IVPB 400 mg        400 mg 200 mL/hr over 60 Minutes Intravenous  Once 03/16/24 2244 03/17/24 0003   03/16/24 2245  metroNIDAZOLE  (FLAGYL ) IVPB 500 mg        500 mg 100 mL/hr over 60 Minutes Intravenous  Once 03/16/24 2244 03/17/24 0150         Subjective: Patient sitting up in bed with complaints of nausea and back pain.  Noted to have an episode of emesis.  Still with loose stools but some improvement with consistency.  Brother at bedside.  Objective: Vitals:   03/19/24 1815 03/19/24 1836 03/19/24 1940 03/20/24 0437  BP: (!) 184/80 (!) 185/69 (!) 195/95 (!) 176/83  Pulse: 68  77 79  Resp: 17 16 18 14   Temp:  97.8 F (36.6 C) (!) 97.3 F (36.3 C) 98.8 F (37.1 C)  TempSrc:  Oral Oral Oral  SpO2: 100% 97% 100% 97%  Weight:      Height:        Intake/Output Summary (Last 24 hours) at 03/20/2024 1200 Last data filed at 03/20/2024 0857 Gross per 24 hour  Intake 1625.96 ml  Output 850 ml  Net 775.96 ml   Filed Weights   03/17/24 0136 03/19/24 1530  Weight: 44.4 kg 44.4  kg    Examination:  General exam: NAD.   Respiratory system: CTAB.  No wheezes, no crackles, no rhonchi.  Fair air movement.  Speaking in full sentences.  Cardiovascular system: RRR no murmurs rubs or gallops.  No JVD.  No pitting lower extremity edema.  Gastrointestinal system: Abdomen is soft, nondistended, decreased diffuse tenderness to palpation.  Positive bowel sounds.  No rebound.  No guarding.  Central nervous system: Alert and oriented.  Moving extremity spontaneously no focal neurological deficits. Extremities: Symmetric 5 x 5 power. Skin: No rashes, lesions or ulcers Psychiatry: Judgement and insight appear poor to fair. Mood & affect appropriate.     Data Reviewed: I have personally reviewed following labs and imaging studies  CBC: Recent Labs  Lab 03/16/24 2007 03/17/24 0125 03/18/24 0642 03/19/24 0815 03/20/24 0640  WBC 6.7 5.2 2.3* 3.4* 5.4  NEUTROABS 5.4  --   --  2.5 4.4  HGB 10.6* 9.3* 7.9* 9.4* 9.6*  HCT 31.4* 28.6* 23.8* 28.7* 28.0*  MCV 93.7 96.0 95.2 93.5 92.4  PLT 189 136* 119* 149* 158  Basic Metabolic Panel: Recent Labs  Lab 03/16/24 2007 03/17/24 0125 03/18/24 0642 03/19/24 0815 03/20/24 0640  NA 136 135 138 139 138  K 3.9 4.1 3.1* 4.0 3.5  CL 103 105 113* 112* 108  CO2 22 22 21* 20* 20*  GLUCOSE 147* 127* 93 104* 121*  BUN 17 14 6* <5* <5*  CREATININE 0.95 0.84 0.68 0.66 0.59  CALCIUM  9.1 8.2* 7.2* 8.2* 8.5*  MG  --  1.8 1.4* 2.4 1.8  PHOS  --   --  2.3* 2.4* 2.3*    GFR: Estimated Creatinine Clearance: 36.7 mL/min (by C-G formula based on SCr of 0.59 mg/dL).  Liver Function Tests: Recent Labs  Lab 03/16/24 2007 03/18/24 9357 03/19/24 0815 03/20/24 0640  AST 24  --   --  35  ALT 13  --   --  18  ALKPHOS 39  --   --  36*  BILITOT 0.6  --   --  0.3  PROT 6.3*  --   --  5.4*  ALBUMIN 3.6 2.5* 3.2* 3.1*  3.1*    CBG: No results for input(s): GLUCAP in the last 168 hours.   Recent Results (from the past 240 hours)   C Difficile Quick Screen w PCR reflex     Status: None   Collection Time: 03/17/24 12:37 AM   Specimen: Stool  Result Value Ref Range Status   C Diff antigen NEGATIVE NEGATIVE Final   C Diff toxin NEGATIVE NEGATIVE Final   C Diff interpretation No C. difficile detected.  Final    Comment: Performed at [facility], [address], [place], [place] [address]  Gastrointestinal Panel by PCR , Stool     Status: None   Collection Time: 03/18/24 12:37 AM   Specimen: Stool  Result Value Ref Range Status   Campylobacter species NOT DETECTED NOT DETECTED Final   Plesimonas shigelloides NOT DETECTED NOT DETECTED Final   Salmonella species NOT DETECTED NOT DETECTED Final   Yersinia enterocolitica NOT DETECTED NOT DETECTED Final   Vibrio species NOT DETECTED NOT DETECTED Final   Vibrio cholerae NOT DETECTED NOT DETECTED Final   Enteroaggregative E coli (EAEC) NOT DETECTED NOT DETECTED Final   Enteropathogenic E coli (EPEC) NOT DETECTED NOT DETECTED Final   Enterotoxigenic E coli (ETEC) NOT DETECTED NOT DETECTED Final   Shiga like toxin producing E coli (STEC) NOT DETECTED NOT DETECTED Final   Shigella/Enteroinvasive E coli (EIEC) NOT DETECTED NOT DETECTED Final   Cryptosporidium NOT DETECTED NOT DETECTED Final   Cyclospora cayetanensis NOT DETECTED NOT DETECTED Final   Entamoeba histolytica NOT DETECTED NOT DETECTED Final   Giardia lamblia NOT DETECTED NOT DETECTED Final   Adenovirus F40/41 NOT DETECTED NOT DETECTED Final   Astrovirus NOT DETECTED NOT DETECTED Final   Norovirus GI/GII NOT DETECTED NOT DETECTED Final   Rotavirus A NOT DETECTED NOT DETECTED Final   Sapovirus (I, II, IV, and V) NOT DETECTED NOT DETECTED Final    Comment: Performed at [facility], [address] [place], [place] [address]         Radiology Studies: No results found.       Scheduled Meds:  aspirin  EC  81 mg Oral Daily   feeding supplement  237 mL Oral BID BM   [START  ON 03/21/2024] losartan   100 mg Oral Daily   phosphorus  250 mg Oral BID   Continuous Infusions:  cefTRIAXone  (ROCEPHIN )  IV 2 g (03/20/24 1009)   famotidine  (  PEPCID ) IV 20 mg (03/19/24 1904)   metronidazole  500 mg (03/20/24 1102)     LOS: 4 days    Time spent: 35 minutes    Toribio Hummer, MD [facility]   To contact the attending provider between 7A-7P or the covering provider during after hours 7P-7A, please log into the web site [email or web address] and access using universal [place] password for that web site. If you do not have the password, please call the hospital operator.  03/20/2024, 12:00 PM

## 2024-03-21 ENCOUNTER — Inpatient Hospital Stay: Payer: Medicare (Managed Care) | Admitting: Oncology

## 2024-03-21 ENCOUNTER — Ambulatory Visit

## 2024-03-21 ENCOUNTER — Other Ambulatory Visit: Payer: Self-pay

## 2024-03-21 ENCOUNTER — Ambulatory Visit
Admission: RE | Admit: 2024-03-21 | Discharge: 2024-03-21 | Disposition: A | Discharge status: Home / Self Care | Payer: Medicare (Managed Care) | Source: Ambulatory Visit | Attending: Radiation Oncology | Admitting: Radiation Oncology

## 2024-03-21 ENCOUNTER — Encounter (HOSPITAL_COMMUNITY): Payer: Self-pay | Admitting: Gastroenterology

## 2024-03-21 DIAGNOSIS — K529 Noninfective gastroenteritis and colitis, unspecified: Secondary | ICD-10-CM | POA: Diagnosis not present

## 2024-03-21 LAB — RAD ONC ARIA SESSION SUMMARY
Course Elapsed Days: 37
Plan Fractions Treated to Date: 2
Plan Prescribed Dose Per Fraction: 1.8 Gy
Plan Total Fractions Prescribed: 3
Plan Total Prescribed Dose: 5.4 Gy
Reference Point Dosage Given to Date: 3.6 Gy
Reference Point Session Dosage Given: 1.8 Gy
Session Number: 27

## 2024-03-21 LAB — CBC
HCT: 27.5 % — ABNORMAL LOW (ref 36.0–46.0)
Hemoglobin: 9.2 g/dL — ABNORMAL LOW (ref 12.0–15.0)
MCH: 31.4 pg (ref 26.0–34.0)
MCHC: 33.5 g/dL (ref 30.0–36.0)
MCV: 93.9 fL (ref 80.0–100.0)
Platelets: 159 K/uL (ref 150–400)
RBC: 2.93 MIL/uL — ABNORMAL LOW (ref 3.87–5.11)
RDW: 15.6 % — ABNORMAL HIGH (ref 11.5–15.5)
WBC: 3.4 K/uL — ABNORMAL LOW (ref 4.0–10.5)
nRBC: 0 % (ref 0.0–0.2)

## 2024-03-21 LAB — BASIC METABOLIC PANEL WITH GFR
Anion gap: 6 (ref 5–15)
BUN: <5 mg/dL — ABNORMAL LOW (ref 8–23)
CO2: 23 mmol/L (ref 22–32)
Calcium: 8.5 mg/dL — ABNORMAL LOW (ref 8.9–10.3)
Chloride: 106 mmol/L (ref 98–111)
Creatinine, Ser: 0.7 mg/dL (ref 0.44–1.00)
GFR, Estimated: >60 mL/min (ref >60)
Glucose, Bld: 101 mg/dL — ABNORMAL HIGH (ref 70–99)
Potassium: 3.5 mmol/L (ref 3.5–5.1)
Sodium: 135 mmol/L (ref 135–145)

## 2024-03-21 MED ORDER — FAMOTIDINE 20 MG PO TABS
20.0000 mg | ORAL_TABLET | Freq: Every day | ORAL | Status: DC
  Administered 2024-03-21: 20 mg via ORAL
  Filled 2024-03-21: qty 1

## 2024-03-21 NOTE — Plan of Care (Signed)
  Problem: Clinical Measurements: Goal: Respiratory complications will improve Outcome: Progressing Goal: Cardiovascular complication will be avoided Outcome: Progressing   Problem: Activity: Goal: Risk for activity intolerance will decrease Outcome: Progressing   Problem: Elimination: Goal: Will not experience complications related to urinary retention Outcome: Progressing   

## 2024-03-21 NOTE — Progress Notes (Signed)
 Progress Note   Patient: Caroline Snyder FMW:[id number] DOB: February 12, 1941 DOA: 03/16/2024     5 DOS: the patient was seen and examined on 03/21/2024   Brief hospital course: 83 year old female history of vascular dementia, primary cholangiocarcinoma over the extrahepatic bile duct currently on chemo and radiation, depression, GERD, hyperlipidemia, hypertension presented to the ED with nausea vomiting diarrhea. CT abdomen and pelvis done concerning for colitis. Stool studies ordered and pending. Patient placed empirically on IV antibiotics. GI consulted for further evaluation and management.   Assessment and Plan: #1 acute colitis/ileitis -Patient presented with nausea and vomiting diarrhea. - Differential includes infectious versus inflammatory versus ischemic colitis. - CT abdomen and pelvis obtained with colonic wall thickening and pericolonic edema extending from the cecum through the transverse colon and also involving the sigmoid colon consistent with colitis of infectious, inflammatory or ischemic etiology.  Fluid-filled small bowel in the right lower quadrant with mild mucosal enhancement of terminal ileum consistent with ileitis.  Common bile duct stent in place with pneumobilia.  No biliary distention.  Indistinct hazy soft tissue density at the porta hepatis, surrounding common bile duct stent and now with interval focal narrowed appearance of the portal vein and presumably related to history of known bile duct malignancy.  No evidence of portal vein thrombosis or occlusion.  Hepatic steatosis.  Aortic atherosclerosis. - GI pathogen panel by PCR negative. - C. difficile by PCR negative. - Patient afebrile. - Continued empiric antibiotics of IV Rocephin , IV Flagyl . - IV antiemetics, IV fluids, supportive care. -Discontinued enteric precautions. - Patient seen in consultation by GI, s/p stent replacement  -Pt tolerating some PO today, but does not feel ready to discharge yet   2.   Cholangiocarcinoma -Patient currently undergoing chemotherapy and radiation treatment. - Continue to hold capecitabine  until resolution of problem #1 per GI recommendations. - Biliary stent noted to be in place on CT abdomen and pelvis. - Patient's status post ERCP with biliary stent exchange per Dr. Wilhelmenia 03/19/2024.  - GI following.  -Patient's oncologist informed of admission via epic. -Radiation oncology, Dr. Dewey informed of patient's admission via secure chat and recommended to monitor the patient for another 24 hours and if continued improvement may consider radiation treatment while in house. - Outpatient follow-up with oncology.   3.  Anemia -Felt likely secondary to hemodilution and chemotherapy. - Patient with no overt bleeding. - Anemia panel with iron level of 45, TIBC of 237, ferritin of 86, folate of 16.8, vitamin B12 328. -Hemoglobin currently at 9.6.  9.4 from 7.9 from 10.6 on admission. - Follow H&H. - Transfusion threshold hemoglobin < 7. - GI following and at this time no plans for endoscopic evaluation for bleeding. - Status post ERCP 03/19/2024.  - Per GI.   4.  Vascular dementia -Delirium precautions.   5.  Hyperlipidemia - Continue to hold Crestor  during the hospitalization and resume on discharge.    6.  Hypertension - Increase Cozaar  back to home regimen of 100 mg daily.   - IV hydralazine  as needed.    7.  Hypokalemia/hypomagnesemia/hypophosphatemia - Likely secondary to GI losses.   -recheck bmet in AM      Subjective: Tolerating diet when seen but eating very slowly. Does not feel comfortable leaving today  Physical Exam: Vitals:   03/20/24 1341 03/20/24 2154 03/21/24 0458 03/21/24 1432  BP: (!) 158/74 (!) 154/76 (!) 157/77 (!) 146/72  Pulse: 71 71 72 73  Resp: 15 18 18 17   Temp: 97.8 F (  36.6 C) 98.4 F (36.9 C) 98.5 F (36.9 C) 97.9 F (36.6 C)  TempSrc:    Oral  SpO2: 98% 98% 97% 98%  Weight:      Height:       General exam:  Awake, laying in bed, in nad Respiratory system: Normal respiratory effort, no wheezing Cardiovascular system: regular rate, s1, s2 Gastrointestinal system: Soft, nondistended, positive BS Central nervous system: CN2-12 grossly intact, strength intact Extremities: Perfused, no clubbing Skin: Normal skin turgor, no notable skin lesions seen Psychiatry: Mood normal // no visual hallucinations   Data Reviewed:  Labs reviewed: Na 135, K 3.5, Cr 0.70, WBC 3.4, Hgb 9.2, Plts 159  Family Communication: Pt in room, family not at bedside  Disposition: Status is: Inpatient Remains inpatient appropriate because: severity of illness  Planned Discharge Destination: Home     Author: Garnette Pelt, MD 03/21/2024 5:02 PM  For on call review [email or web address].

## 2024-03-21 NOTE — Hospital Course (Signed)
 83 year old female history of vascular dementia, primary cholangiocarcinoma over the extrahepatic bile duct currently on chemo and radiation, depression, GERD, hyperlipidemia, hypertension presented to the ED with nausea vomiting diarrhea. CT abdomen and pelvis done concerning for colitis. Stool studies ordered and pending. Patient placed empirically on IV antibiotics. GI consulted for further evaluation and management.

## 2024-03-21 NOTE — Progress Notes (Signed)
 Mobility Specialist - Progress Note   03/21/24 1400  Mobility  Activity Ambulated with assistance  Level of Assistance Contact guard assist, steadying assist  Assistive Device None  Distance Ambulated (ft) 200 ft  Range of Motion/Exercises Active  Activity Response Tolerated well  Mobility Referral Yes  Mobility visit 1 Mobility  Mobility Specialist Start Time (ACUTE ONLY) 1400  Mobility Specialist Stop Time (ACUTE ONLY) 1411  Mobility Specialist Time Calculation (min) (ACUTE ONLY) 11 min   Received in bed and agreed to mobility. C/o nausea nearing EOS, returned to bed with all needs met.  Cyndee Ada Mobility Specialist

## 2024-03-21 NOTE — Plan of Care (Signed)
  Problem: Health Behavior/Discharge Planning: Goal: Ability to manage health-related needs will improve Outcome: Progressing   Problem: Clinical Measurements: Goal: Respiratory complications will improve Outcome: Progressing   Problem: Coping: Goal: Level of anxiety will decrease Outcome: Progressing   

## 2024-03-22 ENCOUNTER — Ambulatory Visit
Admission: RE | Admit: 2024-03-22 | Discharge: 2024-03-22 | Disposition: A | Discharge status: Home / Self Care | Payer: Medicare (Managed Care) | Source: Ambulatory Visit | Attending: Radiation Oncology | Admitting: Radiation Oncology

## 2024-03-22 ENCOUNTER — Other Ambulatory Visit (HOSPITAL_COMMUNITY): Payer: Self-pay

## 2024-03-22 ENCOUNTER — Inpatient Hospital Stay
Admit: 2024-03-22 | Discharge: 2024-03-22 | Discharge status: Home / Self Care | Payer: Medicare (Managed Care) | Attending: Radiation Oncology

## 2024-03-22 ENCOUNTER — Other Ambulatory Visit: Payer: Self-pay

## 2024-03-22 DIAGNOSIS — Z510 Encounter for antineoplastic radiation therapy: Secondary | ICD-10-CM | POA: Diagnosis not present

## 2024-03-22 DIAGNOSIS — C240 Malignant neoplasm of extrahepatic bile duct: Secondary | ICD-10-CM | POA: Diagnosis not present

## 2024-03-22 DIAGNOSIS — K529 Noninfective gastroenteritis and colitis, unspecified: Secondary | ICD-10-CM | POA: Diagnosis not present

## 2024-03-22 LAB — RAD ONC ARIA SESSION SUMMARY
Course Elapsed Days: 38
Plan Fractions Treated to Date: 3
Plan Prescribed Dose Per Fraction: 1.8 Gy
Plan Total Fractions Prescribed: 3
Plan Total Prescribed Dose: 5.4 Gy
Reference Point Dosage Given to Date: 5.4 Gy
Reference Point Session Dosage Given: 1.8 Gy
Session Number: 28

## 2024-03-22 LAB — CBC
HCT: 29.4 % — ABNORMAL LOW (ref 36.0–46.0)
Hemoglobin: 9.8 g/dL — ABNORMAL LOW (ref 12.0–15.0)
MCH: 31.4 pg (ref 26.0–34.0)
MCHC: 33.3 g/dL (ref 30.0–36.0)
MCV: 94.2 fL (ref 80.0–100.0)
Platelets: 162 K/uL (ref 150–400)
RBC: 3.12 MIL/uL — ABNORMAL LOW (ref 3.87–5.11)
RDW: 15.9 % — ABNORMAL HIGH (ref 11.5–15.5)
WBC: 4.2 K/uL (ref 4.0–10.5)
nRBC: 0 % (ref 0.0–0.2)

## 2024-03-22 LAB — COMPREHENSIVE METABOLIC PANEL WITH GFR
ALT: 26 U/L (ref 0–44)
AST: 35 U/L (ref 15–41)
Albumin: 2.9 g/dL — ABNORMAL LOW (ref 3.5–5.0)
Alkaline Phosphatase: 34 U/L — ABNORMAL LOW (ref 38–126)
Anion gap: 6 (ref 5–15)
BUN: 8 mg/dL (ref 8–23)
CO2: 25 mmol/L (ref 22–32)
Calcium: 8.7 mg/dL — ABNORMAL LOW (ref 8.9–10.3)
Chloride: 105 mmol/L (ref 98–111)
Creatinine, Ser: 0.65 mg/dL (ref 0.44–1.00)
GFR, Estimated: >60 mL/min (ref >60)
Glucose, Bld: 121 mg/dL — ABNORMAL HIGH (ref 70–99)
Potassium: 3.7 mmol/L (ref 3.5–5.1)
Sodium: 136 mmol/L (ref 135–145)
Total Bilirubin: 0.5 mg/dL (ref 0.0–1.2)
Total Protein: 5.1 g/dL — ABNORMAL LOW (ref 6.5–8.1)

## 2024-03-22 MED ORDER — AMOXICILLIN-POT CLAVULANATE 875-125 MG PO TABS
1.0000 | ORAL_TABLET | Freq: Two times a day (BID) | ORAL | 0 refills | Status: DC
  Filled 2024-03-22: qty 4, 2d supply, fill #0

## 2024-03-22 MED ORDER — PROCHLORPERAZINE MALEATE 10 MG PO TABS
5.0000 mg | ORAL_TABLET | Freq: Four times a day (QID) | ORAL | 0 refills | Status: AC | PRN
  Filled 2024-03-22: qty 15, 8d supply, fill #0

## 2024-03-22 NOTE — Progress Notes (Signed)
 Discharge meds in a secure bag delivered to pt in room by this RN

## 2024-03-22 NOTE — Plan of Care (Signed)
  Problem: Clinical Measurements: Goal: Diagnostic test results will improve Outcome: Progressing   Problem: Nutrition: Goal: Adequate nutrition will be maintained Outcome: Progressing   Problem: Elimination: Goal: Will not experience complications related to bowel motility Outcome: Progressing

## 2024-03-22 NOTE — TOC Progression Note (Signed)
 Transition of Care [facility] - Progression Note    Patient Details  Name: Caroline Snyder MRN: [id number] Date of Birth: [birth year]  Transition of Care [facility]) CM/SW Contact  Toy LITTIE Agar, RN Phone Number:[phone]  03/22/2024, 3:08 PM  Clinical Narrative:    CM received message from MD about [facility] referral. CM at bedside but patient has already discharged. Cm attempted to contact patient but there is no answer. CM reached out to son [name] [phone]. CM has explained referral to [facility]. [name] gives CM permission to set up [facility]. Son states that they have no preference. Referral has been accepted by [facility] with [facility]. No other TOC needs noted     Barriers to Discharge: Continued Medical Work up               Expected Discharge Plan and Services In-house Referral: NA Discharge Planning Services: NA   Living arrangements for the past 2 months: Single Family Home Expected Discharge Date: 03/22/24               DME Arranged: N/A DME Agency: NA       HH Arranged: NA HH Agency: NA         Social Drivers of Health (SDOH) Interventions SDOH Screenings   Food Insecurity: No Food Insecurity (03/17/2024)  Housing: Low Risk  (03/17/2024)  Transportation Needs: No Transportation Needs (03/17/2024)  Utilities: At Risk (03/17/2024)  Depression (PHQ2-9): Low Risk  (02/02/2024)  Financial Resource Strain: Low Risk  (06/09/2018)  Physical Activity: Insufficiently Active (06/09/2018)  Social Connections: Moderately Integrated (03/17/2024)  Recent Concern: Social Connections - Moderately Isolated (12/18/2023)  Stress: No Stress Concern Present (06/09/2018)  Tobacco Use: Low Risk  (03/16/2024)    Readmission Risk Interventions    03/18/2024    3:07 PM 12/23/2023    2:52 PM  Readmission Risk Prevention Plan  Post Dischage Appt  Complete  Medication Screening  Complete  Transportation Screening Complete Complete  PCP or Specialist Appt within 5-7 Days Complete   Home  Care Screening Complete   Medication Review (RN CM) Complete

## 2024-03-22 NOTE — Discharge Summary (Signed)
 Physician Discharge Summary   Patient: Caroline Snyder MRN: [id number] DOB: 09-17-1940  Admit date:     03/16/2024  Discharge date: 03/22/24  Discharge Physician: Garnette Pelt   PCP: [name], NP   Recommendations at discharge:    Follow up with PCP in 1-2 weeks Follow up with radiation oncology as scheduled Follow up with Oncology as scheduled  Discharge Diagnoses: Principal Problem:   Colitis Active Problems:   Hyperlipidemia   Essential hypertension   Nausea and vomiting   Vascular dementia (HCC)   Cholangiocarcinoma (HCC)   Anemia   Pressure injury of skin   Hypomagnesemia   Hypophosphatemia   Hypokalemia  Resolved Problems:   * No resolved hospital problems. *  Hospital Course: 83 year old female history of vascular dementia, primary cholangiocarcinoma over the extrahepatic bile duct currently on chemo and radiation, depression, GERD, hyperlipidemia, hypertension presented to the ED with nausea vomiting diarrhea. CT abdomen and pelvis done concerning for colitis. Stool studies ordered and pending. Patient placed empirically on IV antibiotics. GI consulted for further evaluation and management.   Assessment and Plan: #1 acute colitis/ileitis -Patient presented with nausea and vomiting diarrhea. - Differential includes infectious versus inflammatory versus ischemic colitis. - CT abdomen and pelvis obtained with colonic wall thickening and pericolonic edema extending from the cecum through the transverse colon and also involving the sigmoid colon consistent with colitis of infectious, inflammatory or ischemic etiology.  Fluid-filled small bowel in the right lower quadrant with mild mucosal enhancement of terminal ileum consistent with ileitis.  Common bile duct stent in place with pneumobilia.  No biliary distention.  Indistinct hazy soft tissue density at the porta hepatis, surrounding common bile duct stent and now with interval focal narrowed appearance of the  portal vein and presumably related to history of known bile duct malignancy.  No evidence of portal vein thrombosis or occlusion.  Hepatic steatosis.  Aortic atherosclerosis. - GI pathogen panel by PCR negative. - C. difficile by PCR negative. - Patient afebrile. - Continued empiric antibiotics of IV Rocephin , IV Flagyl  whle inpatient, Would complete course with augmentin  x 2 more days after d/c -Discontinued enteric precautions. - Patient seen in consultation by GI, s/p stent replacement  -tolerating diet   2.  Cholangiocarcinoma -Patient currently undergoing chemotherapy and radiation treatment. - Continue to hold capecitabine  until resolution of problem #1 per GI recommendations. - Biliary stent noted to be in place on CT abdomen and pelvis. - Patient's status post ERCP with biliary stent exchange per Dr. Wilhelmenia 03/19/2024.  - GI following.  -Patient's oncologist informed of admission via epic. -Radiation oncology, Dr. Dewey informed of patient's admission via secure chat and recommended to monitor the patient for another 24 hours and if continued improvement may consider radiation treatment while in house. - Outpatient follow-up with oncology.   3.  Anemia -Felt likely secondary to hemodilution and chemotherapy. - Patient with no overt bleeding. - Anemia panel with iron level of 45, TIBC of 237, ferritin of 86, folate of 16.8, vitamin B12 328. -Hemoglobin currently at 9.6.  9.4 from 7.9 from 10.6 on admission. - GI following and at this time no plans for endoscopic evaluation for bleeding. - Status post ERCP 03/19/2024.    4.  Vascular dementia -Continued delirium precautions.   5.  Hyperlipidemia - Continue to hold Crestor  during the hospitalization and resume on discharge.    6.  Hypertension - Increased Cozaar  back to home regimen of 100 mg daily.     7.  Hypokalemia/hypomagnesemia/hypophosphatemia - Likely secondary to GI losses.         Consultants: GI Procedures  performed: ERCP  Disposition: Home Diet recommendation:  Regular diet DISCHARGE MEDICATION: Allergies as of 03/22/2024       Reactions   Aricept  [donepezil ] Diarrhea   Vioxx [rofecoxib] Nausea Only   Pt. Can't remember what reaction was but asks to leave on her profile.         Medication List     TAKE these medications    amoxicillin -clavulanate 875-125 MG tablet Commonly known as: AUGMENTIN  Take 1 tablet by mouth 2 (two) times daily for 2 days. Start taking on: March 23, 2024   aspirin  EC 81 MG tablet Take 1 tablet (81 mg total) by mouth daily. Swallow whole.   capecitabine  500 MG tablet Commonly known as: XELODA  Take 2 tablets (1000mg ) by mouth twice daily within 30 mins of a meal. Take on Monday through Friday, during the days of radiation.   Ensure Take 237 mLs by mouth 2 (two) times daily between meals.   LORazepam  0.5 MG tablet Commonly known as: ATIVAN  Take 0.5 tablets (0.25 mg total) by mouth every 6 (six) hours as needed (nausea not relieved by Zofran  (Ondansetron )).   losartan  100 MG tablet Commonly known as: COZAAR  Take 1 tablet (100 mg total) by mouth daily. Essential hypertension I10   ondansetron  8 MG tablet Commonly known as: ZOFRAN  Take 1 tablet (8 mg total) by mouth every 8 (eight) hours as needed for nausea or vomiting.   pantoprazole  40 MG tablet Commonly known as: PROTONIX  Take 40 mg by mouth daily as needed.   prochlorperazine  5 MG tablet Commonly known as: COMPAZINE  Take 1 tablet (5 mg total) by mouth every 6 (six) hours as needed for refractory nausea / vomiting.   rosuvastatin  20 MG tablet Commonly known as: CRESTOR  Take 0.5 tablets (10 mg total) by mouth daily. What changed: how much to take        Follow-up Information     [name], NP Follow up in 2 week(s).   Specialty: Geriatric Medicine Why: Hospital follow up Contact information: [address] [place] [address] [phone]         Dewey Rush, MD Follow up.   Specialty: Radiation Oncology Why: as scheduled Contact information: [address] [place] [address] [phone]         Autumn Millman, MD Follow up.   Specialty: Oncology Why: Hospital follow up Contact information: [address] [place] [address] [phone]                Discharge Exam: Fredricka Weights   03/17/24 0136 03/19/24 1530  Weight: 44.4 kg 44.4 kg   General exam: Awake, laying in bed, in nad Respiratory system: Normal respiratory effort, no wheezing Cardiovascular system: regular rate, s1, s2 Gastrointestinal system: Soft, nondistended, positive BS Central nervous system: CN2-12 grossly intact, strength intact Extremities: Perfused, no clubbing Skin: Normal skin turgor, no notable skin lesions seen Psychiatry: Mood normal // no visual hallucinations   Condition at discharge: fair  The results of significant diagnostics from this hospitalization (including imaging, microbiology, ancillary and laboratory) are listed below for reference.   Imaging Studies: CT ABDOMEN PELVIS W CONTRAST Result Date: 03/16/2024 CLINICAL DATA:  Nausea vomiting diarrhea history of bile duct cancer EXAM: CT ABDOMEN AND PELVIS WITH CONTRAST TECHNIQUE: Multidetector CT imaging of the abdomen and pelvis was performed using the standard protocol following bolus administration of intravenous  contrast. RADIATION DOSE REDUCTION: This exam was performed according to the departmental dose-optimization program which includes automated exposure control, adjustment of the mA and/or kV according to patient size and/or use of iterative reconstruction technique. CONTRAST:  OMNIPAQUE  IOHEXOL  300 MG/ML  SOLN COMPARISON:  CT 12/29/2023, MRI 12/18/2023 FINDINGS: Lower chest: Lung bases demonstrate no acute airspace disease. Hepatobiliary: Hepatic steatosis. Pneumobilia. Common bile duct stent remains in place. No biliary distension. Decompressed  gallbladder with small stone at the fundus. Indistinct hazy density surrounding the bile duct stent at the porta hepatis. Narrowed appearance of the portal vein at the porta hepatis by indistinct surrounding soft tissue, series 2, image 24, coronal series 8, image 41, and presumably related to the known history of bowel duct malignancy. Pancreas: No ductal dilatation.  No definite inflammatory change. Spleen: Normal in size without focal abnormality. Adrenals/Urinary Tract: Adrenal glands are normal. Kidneys show no hydronephrosis. The bladder is unremarkable Stomach/Bowel: The stomach is decompressed. No dilated small bowel. Areas of wall thickening and pericolonic stranding, involves the cecum, ascending colon, hepatic flexure and transverse colon. Similar changes at the sigmoid colon. Negative appendix. Diverticular disease. Some fluid-filled small bowel in the right lower quadrant, mucosal enhancement of the terminal ileum and pelvic small bowel loops. Vascular/Lymphatic: Aortic atherosclerosis. No aneurysm. Small porta hepatis lymph nodes measuring up to 8 mm. As stated previously, focal narrowed appearance of the portal vein but without thrombus or occlusion. Reproductive: Status post hysterectomy. No adnexal masses. Other: Negative for pelvic effusion or free air. Musculoskeletal: No acute or suspicious osseous abnormality. IMPRESSION: IMPRESSION 1. Colon wall thickening and pericolonic edema extends from the cecum through the transverse colon and also involving the sigmoid colon consistent with colitis of infectious, inflammatory or ischemic etiology. Fluid-filled small bowel in the right lower quadrant with mild mucosal enhancement of terminal ileum consistent with ileitis. 2. Common bile duct stent remains in place with pneumobilia. No biliary distension. Indistinct hazy soft tissue density at the porta hepatis, surrounding common bile duct stent and now with interval focal narrowed appearance of the  portal vein, and presumably elated to the history of known bile duct malignancy. No evidence for portal vein thrombus or occlusion on this exam. 3. Hepatic steatosis. 4. Aortic atherosclerosis. Aortic Atherosclerosis (ICD10-I70.0). Electronically Signed   By: Luke Bun M.D.   On: 03/16/2024 22:01    Microbiology: Results for orders placed or performed during the hospital encounter of 03/16/24  C Difficile Quick Screen w PCR reflex     Status: None   Collection Time: 03/17/24 12:37 AM   Specimen: Stool  Result Value Ref Range Status   C Diff antigen NEGATIVE NEGATIVE Final   C Diff toxin NEGATIVE NEGATIVE Final   C Diff interpretation No C. difficile detected.  Final    Comment: Performed at [facility], [address], [place], [place] [address]  Gastrointestinal Panel by PCR , Stool     Status: None   Collection Time: 03/18/24 12:37 AM   Specimen: Stool  Result Value Ref Range Status   Campylobacter species NOT DETECTED NOT DETECTED Final   Plesimonas shigelloides NOT DETECTED NOT DETECTED Final   Salmonella species NOT DETECTED NOT DETECTED Final   Yersinia enterocolitica NOT DETECTED NOT DETECTED Final   Vibrio species NOT DETECTED NOT DETECTED Final   Vibrio cholerae NOT DETECTED NOT DETECTED Final   Enteroaggregative E coli (EAEC) NOT DETECTED NOT DETECTED Final   Enteropathogenic E coli (EPEC) NOT DETECTED NOT DETECTED Final  Enterotoxigenic E coli (ETEC) NOT DETECTED NOT DETECTED Final   Shiga like toxin producing E coli (STEC) NOT DETECTED NOT DETECTED Final   Shigella/Enteroinvasive E coli (EIEC) NOT DETECTED NOT DETECTED Final   Cryptosporidium NOT DETECTED NOT DETECTED Final   Cyclospora cayetanensis NOT DETECTED NOT DETECTED Final   Entamoeba histolytica NOT DETECTED NOT DETECTED Final   Giardia lamblia NOT DETECTED NOT DETECTED Final   Adenovirus F40/41 NOT DETECTED NOT DETECTED Final   Astrovirus NOT DETECTED NOT DETECTED Final   Norovirus  GI/GII NOT DETECTED NOT DETECTED Final   Rotavirus A NOT DETECTED NOT DETECTED Final   Sapovirus (I, II, IV, and V) NOT DETECTED NOT DETECTED Final    Comment: Performed at [facility], [address] [place], [place] [address]    Labs: CBC: Recent Labs  Lab 03/16/24 2007 03/17/24 0125 03/18/24 9357 03/19/24 0815 03/20/24 0640 03/21/24 0533 03/22/24 0613  WBC 6.7   < > 2.3* 3.4* 5.4 3.4* 4.2  NEUTROABS 5.4  --   --  2.5 4.4  --   --   HGB 10.6*   < > 7.9* 9.4* 9.6* 9.2* 9.8*  HCT 31.4*   < > 23.8* 28.7* 28.0* 27.5* 29.4*  MCV 93.7   < > 95.2 93.5 92.4 93.9 94.2  PLT 189   < > 119* 149* 158 159 162   < > = values in this interval not displayed.   Basic Metabolic Panel: Recent Labs  Lab 03/17/24 0125 03/18/24 9357 03/19/24 0815 03/20/24 0640 03/21/24 0533 03/22/24 0613  NA 135 138 139 138 135 136  K 4.1 3.1* 4.0 3.5 3.5 3.7  CL 105 113* 112* 108 106 105  CO2 22 21* 20* 20* 23 25  GLUCOSE 127* 93 104* 121* 101* 121*  BUN 14 6* <5* <5* <5* 8  CREATININE 0.84 0.68 0.66 0.59 0.70 0.65  CALCIUM  8.2* 7.2* 8.2* 8.5* 8.5* 8.7*  MG 1.8 1.4* 2.4 1.8  --   --   PHOS  --  2.3* 2.4* 2.3*  --   --    Liver Function Tests: Recent Labs  Lab 03/16/24 2007 03/18/24 9357 03/19/24 0815 03/20/24 0640 03/22/24 0613  AST 24  --   --  35 35  ALT 13  --   --  18 26  ALKPHOS 39  --   --  36* 34*  BILITOT 0.6  --   --  0.3 0.5  PROT 6.3*  --   --  5.4* 5.1*  ALBUMIN 3.6 2.5* 3.2* 3.1*  3.1* 2.9*   CBG: No results for input(s): GLUCAP in the last 168 hours.  Discharge time spent: less than 30 minutes.  Signed: Garnette Pelt, MD [facility] 03/22/2024

## 2024-03-23 ENCOUNTER — Encounter (HOSPITAL_COMMUNITY): Payer: Self-pay | Admitting: Emergency Medicine

## 2024-03-23 ENCOUNTER — Inpatient Hospital Stay (HOSPITAL_COMMUNITY)
Admission: EM | Admit: 2024-03-23 | Discharge: 2024-03-28 | DRG: 393 | Disposition: A | Discharge status: Home / Self Care | Payer: Medicare (Managed Care) | Attending: Internal Medicine | Admitting: Internal Medicine

## 2024-03-23 ENCOUNTER — Emergency Department (HOSPITAL_COMMUNITY): Payer: Medicare (Managed Care)

## 2024-03-23 ENCOUNTER — Other Ambulatory Visit: Payer: Self-pay

## 2024-03-23 DIAGNOSIS — R109 Unspecified abdominal pain: Secondary | ICD-10-CM | POA: Diagnosis not present

## 2024-03-23 DIAGNOSIS — F01A0 Vascular dementia, mild, without behavioral disturbance, psychotic disturbance, mood disturbance, and anxiety: Secondary | ICD-10-CM | POA: Diagnosis not present

## 2024-03-23 DIAGNOSIS — F0153 Vascular dementia, unspecified severity, with mood disturbance: Secondary | ICD-10-CM | POA: Diagnosis present

## 2024-03-23 DIAGNOSIS — I160 Hypertensive urgency: Secondary | ICD-10-CM | POA: Diagnosis present

## 2024-03-23 DIAGNOSIS — Z681 Body mass index (BMI) 19 or less, adult: Secondary | ICD-10-CM | POA: Diagnosis not present

## 2024-03-23 DIAGNOSIS — Z7982 Long term (current) use of aspirin: Secondary | ICD-10-CM

## 2024-03-23 DIAGNOSIS — E872 Acidosis, unspecified: Secondary | ICD-10-CM | POA: Diagnosis present

## 2024-03-23 DIAGNOSIS — T451X5A Adverse effect of antineoplastic and immunosuppressive drugs, initial encounter: Secondary | ICD-10-CM | POA: Diagnosis present

## 2024-03-23 DIAGNOSIS — G893 Neoplasm related pain (acute) (chronic): Secondary | ICD-10-CM | POA: Diagnosis present

## 2024-03-23 DIAGNOSIS — K219 Gastro-esophageal reflux disease without esophagitis: Secondary | ICD-10-CM | POA: Diagnosis present

## 2024-03-23 DIAGNOSIS — Z79899 Other long term (current) drug therapy: Secondary | ICD-10-CM | POA: Diagnosis not present

## 2024-03-23 DIAGNOSIS — R112 Nausea with vomiting, unspecified: Secondary | ICD-10-CM | POA: Diagnosis not present

## 2024-03-23 DIAGNOSIS — E43 Unspecified severe protein-calorie malnutrition: Secondary | ICD-10-CM | POA: Diagnosis present

## 2024-03-23 DIAGNOSIS — K529 Noninfective gastroenteritis and colitis, unspecified: Secondary | ICD-10-CM | POA: Diagnosis present

## 2024-03-23 DIAGNOSIS — E8809 Other disorders of plasma-protein metabolism, not elsewhere classified: Secondary | ICD-10-CM | POA: Diagnosis present

## 2024-03-23 DIAGNOSIS — C240 Malignant neoplasm of extrahepatic bile duct: Secondary | ICD-10-CM | POA: Diagnosis not present

## 2024-03-23 DIAGNOSIS — Z823 Family history of stroke: Secondary | ICD-10-CM

## 2024-03-23 DIAGNOSIS — Z860100 Personal history of colon polyps, unspecified: Secondary | ICD-10-CM

## 2024-03-23 DIAGNOSIS — F0150 Vascular dementia without behavioral disturbance: Secondary | ICD-10-CM | POA: Diagnosis present

## 2024-03-23 DIAGNOSIS — F339 Major depressive disorder, recurrent, unspecified: Secondary | ICD-10-CM | POA: Diagnosis present

## 2024-03-23 DIAGNOSIS — Z881 Allergy status to other antibiotic agents status: Secondary | ICD-10-CM

## 2024-03-23 DIAGNOSIS — K521 Toxic gastroenteritis and colitis: Principal | ICD-10-CM | POA: Diagnosis present

## 2024-03-23 DIAGNOSIS — Z66 Do not resuscitate: Secondary | ICD-10-CM | POA: Diagnosis present

## 2024-03-23 DIAGNOSIS — K2100 Gastro-esophageal reflux disease with esophagitis, without bleeding: Secondary | ICD-10-CM | POA: Diagnosis not present

## 2024-03-23 DIAGNOSIS — Z8249 Family history of ischemic heart disease and other diseases of the circulatory system: Secondary | ICD-10-CM

## 2024-03-23 DIAGNOSIS — E871 Hypo-osmolality and hyponatremia: Secondary | ICD-10-CM | POA: Diagnosis present

## 2024-03-23 DIAGNOSIS — Z833 Family history of diabetes mellitus: Secondary | ICD-10-CM

## 2024-03-23 DIAGNOSIS — Z888 Allergy status to other drugs, medicaments and biological substances status: Secondary | ICD-10-CM

## 2024-03-23 DIAGNOSIS — D6481 Anemia due to antineoplastic chemotherapy: Secondary | ICD-10-CM | POA: Diagnosis present

## 2024-03-23 DIAGNOSIS — Z515 Encounter for palliative care: Secondary | ICD-10-CM | POA: Diagnosis not present

## 2024-03-23 DIAGNOSIS — M810 Age-related osteoporosis without current pathological fracture: Secondary | ICD-10-CM | POA: Diagnosis present

## 2024-03-23 DIAGNOSIS — C221 Intrahepatic bile duct carcinoma: Secondary | ICD-10-CM | POA: Diagnosis present

## 2024-03-23 DIAGNOSIS — Z811 Family history of alcohol abuse and dependence: Secondary | ICD-10-CM

## 2024-03-23 DIAGNOSIS — Z820 Family history of epilepsy and other diseases of the nervous system: Secondary | ICD-10-CM

## 2024-03-23 DIAGNOSIS — E785 Hyperlipidemia, unspecified: Secondary | ICD-10-CM | POA: Diagnosis present

## 2024-03-23 DIAGNOSIS — Z841 Family history of disorders of kidney and ureter: Secondary | ICD-10-CM

## 2024-03-23 DIAGNOSIS — I10 Essential (primary) hypertension: Secondary | ICD-10-CM | POA: Diagnosis present

## 2024-03-23 DIAGNOSIS — E782 Mixed hyperlipidemia: Secondary | ICD-10-CM | POA: Diagnosis not present

## 2024-03-23 DIAGNOSIS — R64 Cachexia: Secondary | ICD-10-CM | POA: Diagnosis present

## 2024-03-23 DIAGNOSIS — R1084 Generalized abdominal pain: Secondary | ICD-10-CM | POA: Diagnosis not present

## 2024-03-23 DIAGNOSIS — R10817 Generalized abdominal tenderness: Secondary | ICD-10-CM | POA: Diagnosis not present

## 2024-03-23 DIAGNOSIS — M549 Dorsalgia, unspecified: Secondary | ICD-10-CM | POA: Diagnosis not present

## 2024-03-23 DIAGNOSIS — R197 Diarrhea, unspecified: Secondary | ICD-10-CM | POA: Diagnosis not present

## 2024-03-23 LAB — CBC WITH DIFFERENTIAL/PLATELET
Abs Immature Granulocytes: 0.08 K/uL — ABNORMAL HIGH (ref 0.00–0.07)
Basophils Absolute: 0 K/uL (ref 0.0–0.1)
Basophils Relative: 0 %
Eosinophils Absolute: 0 K/uL (ref 0.0–0.5)
Eosinophils Relative: 0 %
HCT: 36.1 % (ref 36.0–46.0)
Hemoglobin: 12.1 g/dL (ref 12.0–15.0)
Immature Granulocytes: 1 %
Lymphocytes Relative: 2 %
Lymphs Abs: 0.2 K/uL — ABNORMAL LOW (ref 0.7–4.0)
MCH: 32 pg (ref 26.0–34.0)
MCHC: 33.5 g/dL (ref 30.0–36.0)
MCV: 95.5 fL (ref 80.0–100.0)
Monocytes Absolute: 0.5 K/uL (ref 0.1–1.0)
Monocytes Relative: 7 %
Neutro Abs: 7.3 K/uL (ref 1.7–7.7)
Neutrophils Relative %: 90 %
Platelets: 196 K/uL (ref 150–400)
RBC: 3.78 MIL/uL — ABNORMAL LOW (ref 3.87–5.11)
RDW: 16.4 % — ABNORMAL HIGH (ref 11.5–15.5)
WBC: 8.1 K/uL (ref 4.0–10.5)
nRBC: 0 % (ref 0.0–0.2)

## 2024-03-23 LAB — COMPREHENSIVE METABOLIC PANEL WITH GFR
ALT: 29 U/L (ref 0–44)
AST: 45 U/L — ABNORMAL HIGH (ref 15–41)
Albumin: 3.3 g/dL — ABNORMAL LOW (ref 3.5–5.0)
Alkaline Phosphatase: 45 U/L (ref 38–126)
Anion gap: 15 (ref 5–15)
BUN: 13 mg/dL (ref 8–23)
CO2: 22 mmol/L (ref 22–32)
Calcium: 9.1 mg/dL (ref 8.9–10.3)
Chloride: 102 mmol/L (ref 98–111)
Creatinine, Ser: 0.64 mg/dL (ref 0.44–1.00)
GFR, Estimated: >60 mL/min (ref >60)
Glucose, Bld: 181 mg/dL — ABNORMAL HIGH (ref 70–99)
Potassium: 3.8 mmol/L (ref 3.5–5.1)
Sodium: 139 mmol/L (ref 135–145)
Total Bilirubin: 1 mg/dL (ref 0.0–1.2)
Total Protein: 6.2 g/dL — ABNORMAL LOW (ref 6.5–8.1)

## 2024-03-23 LAB — URINALYSIS, ROUTINE W REFLEX MICROSCOPIC
Bacteria, UA: NONE SEEN
Bilirubin Urine: NEGATIVE
Glucose, UA: 150 mg/dL — AB
Ketones, ur: 20 mg/dL — AB
Leukocytes,Ua: NEGATIVE
Nitrite: NEGATIVE
Protein, ur: NEGATIVE mg/dL
Specific Gravity, Urine: 1.036 — ABNORMAL HIGH (ref 1.005–1.030)
pH: 6 (ref 5.0–8.0)

## 2024-03-23 LAB — LIPASE, BLOOD: Lipase: 24 U/L (ref 11–51)

## 2024-03-23 MED ORDER — OXYCODONE-ACETAMINOPHEN 5-325 MG PO TABS
1.0000 | ORAL_TABLET | Freq: Once | ORAL | Status: AC
  Administered 2024-03-23: 1 via ORAL
  Filled 2024-03-23: qty 1

## 2024-03-23 MED ORDER — METOCLOPRAMIDE HCL 5 MG/ML IJ SOLN
5.0000 mg | Freq: Three times a day (TID) | INTRAMUSCULAR | Status: DC
  Administered 2024-03-23 – 2024-03-27 (×11): 5 mg via INTRAVENOUS
  Filled 2024-03-23 (×11): qty 2

## 2024-03-23 MED ORDER — SODIUM CHLORIDE 0.45 % IV SOLN: INTRAVENOUS | Status: AC

## 2024-03-23 MED ORDER — ACETAMINOPHEN 325 MG PO TABS
650.0000 mg | ORAL_TABLET | Freq: Four times a day (QID) | ORAL | Status: DC | PRN
  Administered 2024-03-24: 650 mg via ORAL
  Filled 2024-03-23: qty 2

## 2024-03-23 MED ORDER — IOHEXOL 350 MG/ML SOLN
90.0000 mL | Freq: Once | INTRAVENOUS | Status: AC | PRN
Start: 2024-03-23 — End: 2024-03-23
  Administered 2024-03-23: 90 mL via INTRAVENOUS

## 2024-03-23 MED ORDER — HYDRALAZINE HCL 20 MG/ML IJ SOLN
10.0000 mg | INTRAMUSCULAR | Status: DC | PRN
  Administered 2024-03-24: 10 mg via INTRAVENOUS
  Filled 2024-03-23: qty 1

## 2024-03-23 MED ORDER — HYDRALAZINE HCL 20 MG/ML IJ SOLN
10.0000 mg | Freq: Once | INTRAMUSCULAR | Status: AC
  Administered 2024-03-23: 10 mg via INTRAVENOUS
  Filled 2024-03-23: qty 1

## 2024-03-23 MED ORDER — LABETALOL HCL 5 MG/ML IV SOLN
10.0000 mg | Freq: Once | INTRAVENOUS | Status: AC
  Administered 2024-03-23: 10 mg via INTRAVENOUS
  Filled 2024-03-23: qty 4

## 2024-03-23 MED ORDER — PANTOPRAZOLE SODIUM 40 MG IV SOLR
40.0000 mg | Freq: Once | INTRAVENOUS | Status: AC
  Administered 2024-03-23: 40 mg via INTRAVENOUS
  Filled 2024-03-23: qty 10

## 2024-03-23 MED ORDER — GADOBUTROL 1 MMOL/ML IV SOLN
5.0000 mL | Freq: Once | INTRAVENOUS | Status: AC | PRN
  Administered 2024-03-23: 5 mL via INTRAVENOUS

## 2024-03-23 MED ORDER — ONDANSETRON HCL 4 MG/2ML IJ SOLN
4.0000 mg | Freq: Once | INTRAMUSCULAR | Status: AC
  Administered 2024-03-23: 4 mg via INTRAVENOUS
  Filled 2024-03-23: qty 2

## 2024-03-23 MED ORDER — MORPHINE SULFATE (PF) 4 MG/ML IV SOLN
4.0000 mg | Freq: Once | INTRAVENOUS | Status: AC
  Administered 2024-03-23: 4 mg via INTRAVENOUS
  Filled 2024-03-23: qty 1

## 2024-03-23 MED ORDER — METOPROLOL TARTRATE 5 MG/5ML IV SOLN
5.0000 mg | Freq: Three times a day (TID) | INTRAVENOUS | Status: DC
  Administered 2024-03-23 – 2024-03-28 (×15): 5 mg via INTRAVENOUS
  Filled 2024-03-23 (×15): qty 5

## 2024-03-23 MED ORDER — HYDROMORPHONE HCL 1 MG/ML IJ SOLN
0.5000 mg | Freq: Once | INTRAMUSCULAR | Status: AC
  Administered 2024-03-23: 0.5 mg via INTRAVENOUS
  Filled 2024-03-23: qty 1

## 2024-03-23 MED ORDER — ONDANSETRON HCL 4 MG/2ML IJ SOLN
4.0000 mg | Freq: Four times a day (QID) | INTRAMUSCULAR | Status: DC | PRN
  Administered 2024-03-24 – 2024-03-28 (×2): 4 mg via INTRAVENOUS
  Filled 2024-03-23 (×2): qty 2

## 2024-03-23 MED ORDER — ACETAMINOPHEN 650 MG RE SUPP: 650.0000 mg | Freq: Four times a day (QID) | RECTAL | Status: DC | PRN

## 2024-03-23 MED ORDER — HYDROMORPHONE HCL 1 MG/ML IJ SOLN
0.5000 mg | INTRAMUSCULAR | Status: DC | PRN
  Administered 2024-03-23 – 2024-03-24 (×4): 0.5 mg via INTRAVENOUS
  Filled 2024-03-23 (×5): qty 0.5

## 2024-03-23 MED ORDER — ONDANSETRON HCL 4 MG PO TABS: 4.0000 mg | ORAL_TABLET | Freq: Four times a day (QID) | ORAL | Status: DC | PRN
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
## 2024-03-23 NOTE — Radiation Completion Notes (Addendum)
  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   Intent: Curative     ==========DELIVERED PLANS==========  First Treatment Date: 2024-02-13 Last Treatment Date: 2024-03-22   Plan Name: Abd_BileDuct Site: Bile Duct Technique: IMRT Mode: Photon Dose Per Fraction: 1.8 Gy Prescribed Dose (Delivered / Prescribed): 45 Gy / 45 Gy Prescribed Fxs (Delivered / Prescribed): 25 / 25   Plan Name: Abd_Bst Site: Bile Duct Technique: IMRT Mode: Photon Dose Per Fraction: 1.8 Gy Prescribed Dose (Delivered / Prescribed): 5.4 Gy / 5.4 Gy Prescribed Fxs (Delivered / Prescribed): 3 / 3     ==========ON TREATMENT VISIT DATES========== 2024-02-16, 2024-02-24, 2024-03-02, 2024-03-08, 2024-03-16, 2024-03-22    See weekly On Treatment Notes in Epic for details in the Media tab (listed as Progress notes on the On Treatment Visit Dates listed above). The patient tolerated radiation. She developed fatigue and intermittent nausea and constipation.   The patient will receive a call in about one month from the radiation oncology department. She will continue follow up with Dr. Autumn as well.      Donald KYM Husband, PAC

## 2024-03-23 NOTE — ED Triage Notes (Signed)
 Pt to ER via EMS from home.  Pt had bile duct cancer and had a surgery for same yesterday.  States since returning home has had n/v/d, and this AM has developed abdominal pain radiating into back.  Pt denies fevers at home, unsure of what kind of surgery she had.  Pt is alert but drowsy, states medications administered by EMS did not change her pain level.

## 2024-03-23 NOTE — ED Provider Notes (Signed)
 [place] EMERGENCY DEPARTMENT AT [facility] Provider Note   CSN: [id number] Arrival date & time: 03/23/24  9165     Patient presents with: Abdominal Pain, Back Pain, Emesis, and Diarrhea   Caroline Snyder is a 83 y.o. female.   83 year old female with past medical history of bile duct adenocarcinoma who was recently admitted for ERCP presenting to the emergency department today with abdominal pain.  The patient was discharged yesterday.  She states that when she got home she has been having nausea, vomiting, and worsening pain.  She was brought back to the emergency department today for further evaluation.  States she is also having pain that radiates to her back.  Denies any focal weakness, numbness, or tingling.   Abdominal Pain Associated symptoms: diarrhea and vomiting   Back Pain Associated symptoms: abdominal pain   Emesis Associated symptoms: abdominal pain and diarrhea   Diarrhea Associated symptoms: abdominal pain and vomiting        Prior to Admission medications   Medication Sig Start Date End Date Taking? Authorizing Provider  LORazepam  (ATIVAN ) 0.5 MG tablet Take 0.5 tablets (0.25 mg total) by mouth every 6 (six) hours as needed (nausea not relieved by Zofran  (Ondansetron )). 03/06/24  Yes Lanell Donald Stagger, PA-C  amoxicillin -clavulanate (AUGMENTIN ) 875-125 MG tablet Take 1 tablet by mouth 2 (two) times daily for 2 days. 03/23/24 03/25/24  Cindy Garnette POUR, MD  aspirin  EC 81 MG tablet Take 1 tablet (81 mg total) by mouth daily. Swallow whole. 10/31/20   Eubanks, Jessica K, NP  capecitabine  (XELODA ) 500 MG tablet Take 2 tablets (1000mg ) by mouth twice daily within 30 mins of a meal. Take on Monday through Friday, during the days of radiation. 02/06/24   Pasam, Chinita, MD  Ensure (ENSURE) Take 237 mLs by mouth 2 (two) times daily between meals.    [provider]  losartan  (COZAAR ) 100 MG tablet Take 1 tablet (100 mg total) by mouth daily.  Essential hypertension I10 04/29/23   [name], NP  ondansetron  (ZOFRAN ) 8 MG tablet Take 1 tablet (8 mg total) by mouth every 8 (eight) hours as needed for nausea or vomiting. 03/06/24   Lanell Donald Stagger, PA-C  pantoprazole  (PROTONIX ) 40 MG tablet Take 40 mg by mouth daily as needed.    [provider]  prochlorperazine  (COMPAZINE ) 10 MG tablet Take 0.5 tablets (5 mg total) by mouth every 6 (six) hours as needed for refractory nausea / vomiting. 03/22/24   Cindy Garnette POUR, MD  rosuvastatin  (CRESTOR ) 20 MG tablet Take 0.5 tablets (10 mg total) by mouth daily. Patient taking differently: Take 20 mg by mouth daily. 12/23/23   Briana Elgin LABOR, MD    Allergies: Aricept  [donepezil ] and Vioxx [rofecoxib]    Review of Systems  Gastrointestinal:  Positive for abdominal pain, diarrhea and vomiting.  Musculoskeletal:  Positive for back pain.  All other systems reviewed and are negative.   Updated Vital Signs BP (!) 161/73   Pulse 73   Temp (!) 96.6 F (35.9 C) (Axillary)   Resp 18   Ht 5' (1.524 m)   Wt 45 kg   SpO2 99%   BMI 19.38 kg/m   Physical Exam Vitals and nursing note reviewed.   Gen: Chronically ill appearing Eyes: PERRL, EOMI HEENT: no oropharyngeal swelling Neck: trachea midline Resp: clear to auscultation bilaterally Card: RRR, no murmurs, rubs, or gallops Abd: RUQ and epigastric tenderness, no guarding or rebound Extremities: no calf tenderness, no edema  Vascular: 2+ radial pulses bilaterally, 2+ DP pulses bilaterally Neuro: equal strength and sensation throughout upper and lower extremities Skin: no rashes Psyc: acting appropriately   (all labs ordered are listed, but only abnormal results are displayed) Labs Reviewed  CBC WITH DIFFERENTIAL/PLATELET - Abnormal; Notable for the following components:      Result Value   RBC 3.78 (*)    RDW 16.4 (*)    Lymphs Abs 0.2 (*)    Abs Immature Granulocytes 0.08 (*)    All other components within  normal limits  COMPREHENSIVE METABOLIC PANEL WITH GFR - Abnormal; Notable for the following components:   Glucose, Bld 181 (*)    Total Protein 6.2 (*)    Albumin 3.3 (*)    AST 45 (*)    All other components within normal limits  URINALYSIS, ROUTINE W REFLEX MICROSCOPIC - Abnormal; Notable for the following components:   Specific Gravity, Urine 1.036 (*)    Glucose, UA 150 (*)    Hgb urine dipstick SMALL (*)    Ketones, ur 20 (*)    All other components within normal limits  LIPASE, BLOOD    EKG: None  Radiology: MR LUMBAR SPINE W WO CONTRAST Result Date: 03/23/2024 CLINICAL DATA:  History of biliary cancer. Question metastatic disease. Back pain. EXAM: MRI LUMBAR SPINE WITHOUT AND WITH CONTRAST TECHNIQUE: Multiplanar and multiecho pulse sequences of the lumbar spine were obtained without and with intravenous contrast. CONTRAST:  5mL GADAVIST  GADOBUTROL  1 MMOL/ML IV SOLN COMPARISON:  Radiography 11/17/2021 FINDINGS: Segmentation:  5 lumbar type vertebral bodies. Alignment: Degenerative anterolisthesis of 4 mm at L3-4 and 6 mm at L4-5. Vertebrae: No fracture or focal bone lesion. No evidence of regional metastatic disease. Edema and enhancement of the facet arthropathy at L3-4 and L4-5. Conus medullaris and cauda equina: Conus extends to the L1 level. Conus and cauda equina appear normal. Paraspinal and other soft tissues: Negative Disc levels: No significant finding from T11-12 through L1-2. L2-3: Mild disc bulge. Moderate bilateral facet degeneration and hypertrophy. Mild canal narrowing but no neural compression. L3-4: Advanced bilateral facet arthropathy with 4 mm of degenerative anterolisthesis. Broad-based disc herniation. Moderate to severe multifactorial stenosis that could cause neural compression on either or both sides. This is slightly worse on the right. L4-5: Advanced bilateral facet arthropathy with anterolisthesis of 6 mm. Shallow protrusion of the disc. Moderate multifactorial  stenosis with potential for neural compression in the lateral recesses. This is more pronounced on the right. Moderate foraminal narrowing on the right as well. L5-S1: No disc abnormality.  Mild facet degeneration.  No stenosis. IMPRESSION: 1. No evidence of metastatic disease to the lumbar spine. 2. L3-4: Advanced bilateral facet arthropathy with 4 mm of anterolisthesis. Broad-based disc herniation. Moderate to severe multifactorial stenosis that could cause neural compression on either or both sides. This is slightly worse on the right. The facet arthritis could certainly be painful. 3. L4-5: Advanced bilateral facet arthropathy with 6 mm of anterolisthesis. Shallow protrusion of the disc. Moderate multifactorial stenosis with potential for neural compression in the lateral recesses, more pronounced on the right. Moderate foraminal narrowing on the right as well. The facet arthritis could be painful. 4. L2-3: Disc bulge. Moderate bilateral facet degeneration and hypertrophy. Mild canal narrowing but no neural compression. 5. L5-S1: Mild facet degeneration. No stenosis. Electronically Signed   By: Oneil Officer M.D.   On: 03/23/2024 12:54   MR THORACIC SPINE W WO CONTRAST Result Date: 03/23/2024 CLINICAL DATA:  Metastatic disease  evaluation. Bile duct cancer. Surgery yesterday. EXAM: MRI THORACIC WITHOUT AND WITH CONTRAST TECHNIQUE: Multiplanar and multiecho pulse sequences of the thoracic spine were obtained without and with intravenous contrast. CONTRAST:  5mL GADAVIST  GADOBUTROL  1 MMOL/ML IV SOLN COMPARISON:  None Available. FINDINGS: Alignment:  No significant malalignment.  Minimal scoliosis. Vertebrae: No fracture or focal bone lesion. No evidence of bony metastatic disease. Cord:  No cord compression or metastatic disease to the cord. Paraspinal and other soft tissues: Tiny pleural effusions layering dependently. Disc levels: No significant disc level finding. No compressive stenosis of the canal or  foramina. 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 IMPRESSION: 1. No evidence of metastatic disease to the thoracic spine. 2. Tiny pleural effusions layering dependently. Electronically Signed   By: Oneil Officer M.D.   On: 03/23/2024 12:49   CT Angio Abdomen W and/or Wo Contrast Result Date: 03/23/2024 CLINICAL DATA:  Aortic aneurysm suspected. Nausea vomiting and diarrhea. Abdominal pain radiating to back. History of bile duct cancer. * Tracking Code: BO * EXAM: CT ANGIOGRAPHY ABDOMEN TECHNIQUE: Multidetector CT imaging of the abdomen was performed using the standard protocol during bolus administration of intravenous contrast. Multiplanar reconstructed images and MIPs were obtained and reviewed to evaluate the vascular anatomy. RADIATION DOSE REDUCTION: This exam was performed according to the departmental dose-optimization program which includes automated exposure control, adjustment of the mA and/or kV according to patient size and/or use of iterative reconstruction technique. CONTRAST:  90mL OMNIPAQUE  IOHEXOL  350 MG/ML SOLN COMPARISON:  CT scan abdomen and pelvis from 03/16/2024. FINDINGS: VASCULAR Aorta: Normal caliber aorta without aneurysm, dissection, vasculitis or significant stenosis. Celiac: Patent without evidence of aneurysm, dissection, vasculitis or significant stenosis. SMA: Patent without evidence of aneurysm, dissection, vasculitis or significant stenosis. 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 All the renal arteries are patent without evidence of aneurysm, dissection, vasculitis, fibromuscular dysplasia or significant stenosis. IMA: Small caliber. Patent without evidence of aneurysm, dissection, vasculitis or significant stenosis. Inflow: Patent without evidence of aneurysm, dissection, vasculitis or significant stenosis. Veins: No obvious venous abnormality within the limitations of this arterial phase  study. Review of the MIP images confirms the above findings. NON-VASCULAR Lower chest: There are patchy atelectatic changes in the visualized lung bases. No overt consolidation. No pleural effusion. The heart is normal in size. No pericardial effusion. Hepatobiliary: The liver is normal in size. Non-cirrhotic configuration. No suspicious mass. Since the prior study, there is exchange of common bile duct stent. There is new wider stent extending from the common hepatic duct into the second part of duodenum. There is resultant pneumobilia mainly in the left hepatic lobe. No intrahepatic bile duct dilation. Very small volume 2-3 mm calcified gallstones noted in the fundus region without imaging signs of acute cholecystitis. Normal gallbladder wall thickness. No pericholecystic inflammatory changes. Pancreas: Unremarkable. No pancreatic ductal dilatation or surrounding inflammatory changes. Spleen: Within normal limits. No focal lesion. Adrenals/Urinary Tract: Adrenal glands are unremarkable. No suspicious renal mass. No nephroureterolithiasis or obstructive uropathy on either side Stomach/Bowel: No disproportionate dilation of the small or large bowel loops. No evidence of abnormal bowel wall thickening or inflammatory changes. The appendix is unremarkable. Vascular/Lymphatic: No ascites or pneumoperitoneum. No abdominal or pelvic lymphadenopathy, by size criteria. No aneurysmal dilation of the major abdominal arteries. There are mild peripheral atherosclerotic vascular calcifications of the aorta and its major branches. Other: There is a tiny fat containing umbilical hernia. The soft tissues and  abdominal wall are otherwise unremarkable. Musculoskeletal: No suspicious osseous lesions. There are mild multilevel degenerative changes in the visualized spine. IMPRESSION: 1. No acute inflammatory process identified within the abdomen or pelvis. 2. No aortic aneurysm, dissection or periaortic fat stranding. 3. Multiple  other nonacute observations, as described above. Aortic Atherosclerosis (ICD10-I70.0). Electronically Signed   By: Ree Molt M.D.   On: 03/23/2024 10:26     Procedures   Medications Ordered in the ED  ondansetron  (ZOFRAN ) injection 4 mg (4 mg Intravenous Given 03/23/24 0906)  morphine  (PF) 4 MG/ML injection 4 mg (4 mg Intravenous Given 03/23/24 0906)  iohexol  (OMNIPAQUE ) 350 MG/ML injection 90 mL (90 mLs Intravenous Contrast Given 03/23/24 0954)  labetalol  (NORMODYNE ) injection 10 mg (10 mg Intravenous Given 03/23/24 1015)  HYDROmorphone  (DILAUDID ) injection 0.5 mg (0.5 mg Intravenous Given 03/23/24 1016)  gadobutrol  (GADAVIST ) 1 MMOL/ML injection 5 mL (5 mLs Intravenous Contrast Given 03/23/24 1227)  hydrALAZINE  (APRESOLINE ) injection 10 mg (10 mg Intravenous Given 03/23/24 1356)  oxyCODONE -acetaminophen  (PERCOCET/ROXICET) 5-325 MG per tablet 1 tablet (1 tablet Oral Given 03/23/24 1401)                                    Medical Decision Making 83 year old female past medical history of bile duct cancer presenting to the emergency department today with abdominal pain and back pain.  I will further evaluate the patient here with basic labs including LFTs and lipase to evaluate for hepatobiliary pathology or pancreatitis.  Will obtain a CT scan of her abdomen to evaluate for postprocedural complications, worsening biliary dilation, appendicitis, diverticulitis, obstruction etc.  Will give the patient morphine  Zofran  for symptoms and reevaluate for ultimate disposition.  If the patient's blood pressure remains elevated after pain medication will treat her blood pressure here.  The patient CT scan is unremarkable.  She was continued complain of back pain.  MRIs were ordered and did not show any acute findings.  The patient did require multiple blood pressure medications as her blood pressure was significantly elevated.  She was still having significant pain and required multiple rounds of pain  medication.  She is still feeling very weak on reevaluation.  Do not think that discharge is safe for the patient now.  Calls placed to hospitalist service for admission.  She may require SNF placement given her relatively long hospitalization versus palliative care evaluation given her poor prognosis.  Amount and/or Complexity of Data Reviewed Labs: ordered. Radiology: ordered.  Risk Prescription drug management. Decision regarding hospitalization.        Final diagnoses:  Hypertensive urgency  Cancer associated pain  Intractable nausea and vomiting    ED Discharge Orders     None          Ula Prentice SAUNDERS, MD 03/23/24 1553

## 2024-03-24 DIAGNOSIS — K529 Noninfective gastroenteritis and colitis, unspecified: Secondary | ICD-10-CM | POA: Diagnosis present

## 2024-03-24 DIAGNOSIS — Z515 Encounter for palliative care: Secondary | ICD-10-CM | POA: Diagnosis not present

## 2024-03-24 DIAGNOSIS — K2100 Gastro-esophageal reflux disease with esophagitis, without bleeding: Secondary | ICD-10-CM

## 2024-03-24 DIAGNOSIS — G893 Neoplasm related pain (acute) (chronic): Secondary | ICD-10-CM | POA: Diagnosis present

## 2024-03-24 DIAGNOSIS — R64 Cachexia: Secondary | ICD-10-CM | POA: Diagnosis present

## 2024-03-24 DIAGNOSIS — K219 Gastro-esophageal reflux disease without esophagitis: Secondary | ICD-10-CM | POA: Diagnosis present

## 2024-03-24 DIAGNOSIS — C240 Malignant neoplasm of extrahepatic bile duct: Secondary | ICD-10-CM

## 2024-03-24 DIAGNOSIS — I160 Hypertensive urgency: Secondary | ICD-10-CM

## 2024-03-24 DIAGNOSIS — E43 Unspecified severe protein-calorie malnutrition: Secondary | ICD-10-CM | POA: Diagnosis present

## 2024-03-24 DIAGNOSIS — R112 Nausea with vomiting, unspecified: Secondary | ICD-10-CM

## 2024-03-24 DIAGNOSIS — R109 Unspecified abdominal pain: Secondary | ICD-10-CM | POA: Diagnosis not present

## 2024-03-24 DIAGNOSIS — E871 Hypo-osmolality and hyponatremia: Secondary | ICD-10-CM | POA: Diagnosis present

## 2024-03-24 DIAGNOSIS — E8809 Other disorders of plasma-protein metabolism, not elsewhere classified: Secondary | ICD-10-CM | POA: Diagnosis present

## 2024-03-24 DIAGNOSIS — Z681 Body mass index (BMI) 19 or less, adult: Secondary | ICD-10-CM | POA: Diagnosis not present

## 2024-03-24 DIAGNOSIS — F0153 Vascular dementia, unspecified severity, with mood disturbance: Secondary | ICD-10-CM | POA: Diagnosis present

## 2024-03-24 DIAGNOSIS — E872 Acidosis, unspecified: Secondary | ICD-10-CM | POA: Diagnosis present

## 2024-03-24 DIAGNOSIS — F339 Major depressive disorder, recurrent, unspecified: Secondary | ICD-10-CM | POA: Diagnosis present

## 2024-03-24 DIAGNOSIS — M810 Age-related osteoporosis without current pathological fracture: Secondary | ICD-10-CM | POA: Diagnosis present

## 2024-03-24 DIAGNOSIS — Z7982 Long term (current) use of aspirin: Secondary | ICD-10-CM | POA: Diagnosis not present

## 2024-03-24 DIAGNOSIS — C221 Intrahepatic bile duct carcinoma: Secondary | ICD-10-CM | POA: Diagnosis present

## 2024-03-24 DIAGNOSIS — I10 Essential (primary) hypertension: Secondary | ICD-10-CM | POA: Diagnosis present

## 2024-03-24 DIAGNOSIS — K521 Toxic gastroenteritis and colitis: Secondary | ICD-10-CM | POA: Diagnosis present

## 2024-03-24 DIAGNOSIS — D6481 Anemia due to antineoplastic chemotherapy: Secondary | ICD-10-CM | POA: Diagnosis present

## 2024-03-24 DIAGNOSIS — E785 Hyperlipidemia, unspecified: Secondary | ICD-10-CM | POA: Diagnosis present

## 2024-03-24 DIAGNOSIS — F01A0 Vascular dementia, mild, without behavioral disturbance, psychotic disturbance, mood disturbance, and anxiety: Secondary | ICD-10-CM

## 2024-03-24 DIAGNOSIS — Z79899 Other long term (current) drug therapy: Secondary | ICD-10-CM | POA: Diagnosis not present

## 2024-03-24 DIAGNOSIS — R197 Diarrhea, unspecified: Secondary | ICD-10-CM

## 2024-03-24 DIAGNOSIS — Z66 Do not resuscitate: Secondary | ICD-10-CM | POA: Diagnosis present

## 2024-03-24 DIAGNOSIS — Z8249 Family history of ischemic heart disease and other diseases of the circulatory system: Secondary | ICD-10-CM | POA: Diagnosis not present

## 2024-03-24 LAB — CBC
HCT: 33.4 % — ABNORMAL LOW (ref 36.0–46.0)
Hemoglobin: 10.7 g/dL — ABNORMAL LOW (ref 12.0–15.0)
MCH: 31.3 pg (ref 26.0–34.0)
MCHC: 32 g/dL (ref 30.0–36.0)
MCV: 97.7 fL (ref 80.0–100.0)
Platelets: 188 K/uL (ref 150–400)
RBC: 3.42 MIL/uL — ABNORMAL LOW (ref 3.87–5.11)
RDW: 17.1 % — ABNORMAL HIGH (ref 11.5–15.5)
WBC: 7.4 K/uL (ref 4.0–10.5)
nRBC: 0 % (ref 0.0–0.2)

## 2024-03-24 LAB — COMPREHENSIVE METABOLIC PANEL WITH GFR
ALT: 28 U/L (ref 0–44)
AST: 41 U/L (ref 15–41)
Albumin: 3 g/dL — ABNORMAL LOW (ref 3.5–5.0)
Alkaline Phosphatase: 40 U/L (ref 38–126)
Anion gap: 10 (ref 5–15)
BUN: 16 mg/dL (ref 8–23)
CO2: 20 mmol/L — ABNORMAL LOW (ref 22–32)
Calcium: 8.8 mg/dL — ABNORMAL LOW (ref 8.9–10.3)
Chloride: 103 mmol/L (ref 98–111)
Creatinine, Ser: 0.58 mg/dL (ref 0.44–1.00)
GFR, Estimated: >60 mL/min (ref >60)
Glucose, Bld: 141 mg/dL — ABNORMAL HIGH (ref 70–99)
Potassium: 3.8 mmol/L (ref 3.5–5.1)
Sodium: 133 mmol/L — ABNORMAL LOW (ref 135–145)
Total Bilirubin: 0.7 mg/dL (ref 0.0–1.2)
Total Protein: 5.6 g/dL — ABNORMAL LOW (ref 6.5–8.1)

## 2024-03-24 LAB — PHOSPHORUS: Phosphorus: 2.1 mg/dL — ABNORMAL LOW (ref 2.5–4.6)

## 2024-03-24 LAB — MAGNESIUM: Magnesium: 1.5 mg/dL — ABNORMAL LOW (ref 1.7–2.4)

## 2024-03-24 MED ORDER — MAGNESIUM SULFATE 4 GM/100ML IV SOLN
4.0000 g | Freq: Once | INTRAVENOUS | Status: AC
  Administered 2024-03-24: 4 g via INTRAVENOUS
  Filled 2024-03-24: qty 100

## 2024-03-24 MED ORDER — PROCHLORPERAZINE EDISYLATE 10 MG/2ML IJ SOLN
10.0000 mg | Freq: Four times a day (QID) | INTRAMUSCULAR | Status: DC | PRN
  Administered 2024-03-24: 10 mg via INTRAVENOUS
  Filled 2024-03-24: qty 2

## 2024-03-24 MED ORDER — TRAMADOL HCL 50 MG PO TABS
50.0000 mg | ORAL_TABLET | Freq: Four times a day (QID) | ORAL | Status: DC | PRN
  Administered 2024-03-24 – 2024-03-25 (×2): 50 mg via ORAL
  Filled 2024-03-24 (×2): qty 1

## 2024-03-24 MED ORDER — ORAL CARE MOUTH RINSE: 15.0000 mL | OROMUCOSAL | Status: DC | PRN

## 2024-03-24 MED ORDER — AMOXICILLIN-POT CLAVULANATE 875-125 MG PO TABS
1.0000 | ORAL_TABLET | Freq: Two times a day (BID) | ORAL | Status: DC
  Administered 2024-03-24 – 2024-03-27 (×7): 1 via ORAL
  Filled 2024-03-24 (×7): qty 1

## 2024-03-24 MED ORDER — SODIUM PHOSPHATES 45 MMOLE/15ML IV SOLN
15.0000 mmol | Freq: Once | INTRAVENOUS | Status: AC
  Administered 2024-03-24: 15 mmol via INTRAVENOUS
  Filled 2024-03-24 (×2): qty 5

## 2024-03-24 MED ORDER — ONDANSETRON 4 MG PO TBDP
4.0000 mg | ORAL_TABLET | Freq: Three times a day (TID) | ORAL | Status: AC
  Administered 2024-03-24 – 2024-03-26 (×6): 4 mg via ORAL
  Filled 2024-03-24 (×6): qty 1

## 2024-03-24 MED ORDER — PANTOPRAZOLE SODIUM 40 MG IV SOLR
40.0000 mg | INTRAVENOUS | Status: DC
  Administered 2024-03-24: 40 mg via INTRAVENOUS
  Filled 2024-03-24: qty 10

## 2024-03-24 NOTE — Care Management Obs Status (Signed)
 MEDICARE OBSERVATION STATUS NOTIFICATION   Patient Details  Name: Caroline Snyder MRN: [id number] Date of Birth: 01-09-1941   Medicare Observation Status Notification Given:  Yes    [name], LCSW 03/24/2024, 4:24 PM

## 2024-03-24 NOTE — Consult Note (Signed)
 Consultation Note Date: 03/24/2024   Patient Name: Caroline Snyder  DOB: 1941/03/27  MRN: [id number]  Age / Sex: 83 y.o., female  PCP: Caroline Harlene POUR, NP Referring Physician: Sherrill Alejandro Donovan, DO  Reason for Consultation: Establishing goals of care and Non pain symptom management  HPI/Patient Profile: 83 y.o. female  admitted on 03/23/2024.  Caroline Snyder is a 83 y.o. female with medical history significant of abnormal chest CT, acute encephalopathy, altered mental status, history of memory loss, cholangiocarcinoma, external hemorrhoids, GERD, benign neoplasm of colon, depression, hyperlipidemia, hypertensive emergency, migraine headaches, osteoporosis who was discharged from the hospital 03-22-24 after 6-day hospitalization due to acute colitis/ileitis, seen by GI and undergoing an ERCP on 03/19/2024 for biliary stent exchange who returned to the hospital due to nausea, vomiting and diarrhea with development of abdominal pain radiating to her back.      Lab work: Urinalysis with an increase of specific gravity of 1.036, small hemoglobin, glucose of 150 and ketones of 20 mg/dL.  The rest of the urinalysis was unremarkable.  CBC showed white count 8.1, hemoglobin 12.1 g/dL platelets 803.  Lipase was normal.  CMP showed a glucose of 181 mg/dL, AST 45 units/L, total protein 6.2 and albumin 3.3 g/dL, the rest of the CMP measurements were normal.   Imaging: CTA abdomen with no aortic aneurysm, dissection or periaortic fat stranding.  No acute inflammatory process within the abdomen or pelvis.  MRI thoracic spine no evidence of metastatic disease to the thoracic spine.  Tiny pleural effusions layering dependently.  MRI lumbar spine no evidence of metastatic disease to the lumbar spine.  L3-4 has advanced bilateral facet atrophy with 4 mm anterolisthesis.  Broad-based disc herniation.  Moderate to severe  multifactorial stenosis that can cause neural compression on either or both sides.  It is slightly worse on the right.  L4-5 with advanced bilateral facet arthropathy.  Shallow protrusion of the disc.  Moderate multifactorial stenosis with potential for neural compression in the lateral recesses, more pronounced on the right.  Moderate foraminal narrowing on the right as well.  L2-3 disc bulge with mild canal narrowing, but no neural compression.  L5-S1 mild facet degeneration.  No stenosis.      Clinical Assessment and Goals of Care:  Patient remains admitted to hospital medicine service Patient seen and examined Tolerating liquid diet but not so great Nausea sensation continues Palliative medicine is specialized medical care for people living with serious illness. It focuses on providing relief from the symptoms and stress of a serious illness. The goal is to improve quality of life for both the patient and the family. Goals of care: Broad aims of medical therapy in relation to the patient's values and preferences. Our aim is to provide medical care aimed at enabling patients to achieve the goals that matter most to them, given the circumstances of their particular medical situation and their constraints.   Patient simply states that she feels nauseous, appears tired,not able to proceed with full goals of care discussion  today, at the time of this initial consult. Medication history noted, see recommendations below.   NEXT OF KIN  Son   SUMMARY OF RECOMMENDATIONS    Add scheduled Zofran  ODT Continue current anti emetic regimen and monitor Continue goals of care discussions.  PMT to follow closely.  Code Status/Advance Care Planning: DNR   Symptom Management:   As above, medication history noted.   Palliative Prophylaxis:  Frequent Pain Assessment    Psycho-social/Spiritual:  Desire for further Chaplaincy support:yes Additional Recommendations: Caregiving   Support/Resources  Prognosis:  Unable to determine  Discharge Planning: To Be Determined      Primary Diagnoses: Present on Admission:  Hypertensive urgency  Essential hypertension  GERD (gastroesophageal reflux disease)  Hyperlipidemia  Depression, recurrent (HCC)  Primary cholangiocarcinoma of extrahepatic bile duct (HCC)  Vascular dementia (HCC)  Nausea vomiting and diarrhea  Abdominal pain   I have reviewed the medical record, interviewed the patient and family, and examined the patient. The following aspects are pertinent.  Past Medical History:  Diagnosis Date   Abnormal CT of the chest 11/24/2013   See CT chest 11/22/13  1. No CT evidence of pulmonary arterial embolic disease.   2. Enlarged lymph nodes in the right hilar and subcarinal region.   These may represent reactive lymph nodes, clinical correlation   recommended. There is otherwise no evidence of mediastinal or   parenchymal masses or nodules nor infiltrates.   3. Atelectasis versus scarring within the lung bases as well as   interst   Acute encephalopathy 09/13/2023   Adenocarcinoma determined by biopsy of bile duct (HCC) 2025   Allergy    Altered mental status 09/13/2023   Benign neoplasm of colon 02/11/2012   Bunion 07/01/2010   Closed nondisplaced fracture of distal phalanx of right thumb 08-15-2020   Death of child 09-24-16   Adult son truck driver died 87/86/82 in accident.     Depression 05/05/2007   External hemorrhoids without mention of complication 03/24/1999   GERD (gastroesophageal reflux disease) 08/02/1998   Hyperlipidemia 06/29/2006   Hypertensive emergency 09/13/2023   Memory loss 04/15/2003   Migraine without aura, without mention of intractable migraine without mention of status migrainosus 03/24/1999   Osteoporosis, unspecified 04/15/2004   Tear film insufficiency, unspecified 03/24/1999   Unspecified essential hypertension 11/22/2007   Social History   Socioeconomic History    Marital status: Widowed    Spouse name: Not on file   Number of children: 2   Years of education: Not on file   Highest education level: Not on file  Occupational History   Occupation: Retired  Tobacco Use   Smoking status: Never   Smokeless tobacco: Never  Vaping Use   Vaping status: Never Used  Substance and Sexual Activity   Alcohol  use: No    Alcohol /week: 0.0 standard drinks of alcohol    Drug use: No   Sexual activity: Not Currently    Comment: 1st intercourse- 16, partners- 2,   Other Topics Concern   Not on file  Social History Narrative   Not on file   Social Drivers of Health   Financial Resource Strain: Low Risk  (06/09/2018)   Overall Financial Resource Strain (CARDIA)    Difficulty of Paying Living Expenses: Not hard at all  Food Insecurity: No Food Insecurity (03/24/2024)   Hunger Vital Sign    Worried About Running Out of Food in the Last Year: Never true    Ran Out of Food in the Last Year: Never  true  Transportation Needs: No Transportation Needs (03/24/2024)   PRAPARE - Administrator, Civil Service (Medical): No    Lack of Transportation (Non-Medical): No  Physical Activity: Insufficiently Active (06/09/2018)   Exercise Vital Sign    Days of Exercise per Week: 1 day    Minutes of Exercise per Session: 60 min  Stress: No Stress Concern Present (06/09/2018)   [employer] of Occupational Health - Occupational Stress Questionnaire    Feeling of Stress : Not at all  Social Connections: Moderately Integrated (03/24/2024)   Social Connection and Isolation Panel    Frequency of Communication with Friends and Family: More than three times a week    Frequency of Social Gatherings with Friends and Family: Three times a week    Attends Religious Services: More than 4 times per year    Active Member of Clubs or Organizations: No    Attends Banker Meetings: 1 to 4 times per year    Marital Status: Widowed   Family History  Problem  Relation Age of Onset   Alzheimer's disease Mother    Diabetes Mother    Transient ischemic attack Mother    Heart disease Mother        MI, CHF   Alcohol  abuse Father    Kidney disease Father    Hypertension Sister    Hypertension Sister    Hypertension Brother    Heart disease Brother    Hypertension Brother    Hypertension Brother    Other Son        truck accident 07/2016   Colon cancer Neg Hx    Stomach cancer Neg Hx    Esophageal cancer Neg Hx    Pancreatic cancer Neg Hx    Scheduled Meds:  amoxicillin -clavulanate  1 tablet Oral Q12H   metoCLOPramide  (REGLAN ) injection  5 mg Intravenous Q8H   metoprolol  tartrate  5 mg Intravenous Q8H   ondansetron   4 mg Oral Q8H   Continuous Infusions:  sodium PHOSPHATE  IVPB (in mmol) 15 mmol (03/24/24 1246)   PRN Meds:.acetaminophen  **OR** acetaminophen , hydrALAZINE , ondansetron  **OR** ondansetron  (ZOFRAN ) IV, mouth rinse, prochlorperazine , traMADol  Medications Prior to Admission:  Prior to Admission medications   Medication Sig Start Date End Date Taking? Authorizing Provider  acetaminophen  (TYLENOL ) 500 MG tablet Take 500-1,000 mg by mouth every 8 (eight) hours as needed (for pain).   Yes [provider]  aspirin  EC 81 MG tablet Take 1 tablet (81 mg total) by mouth daily. Swallow whole. 10/31/20  Yes Eubanks, Jessica K, NP  Ensure (ENSURE) Take 237 mLs by mouth 2 (two) times daily between meals.   Yes [provider]  LORazepam  (ATIVAN ) 0.5 MG tablet Take 0.5 tablets (0.25 mg total) by mouth every 6 (six) hours as needed (nausea not relieved by Zofran  (Ondansetron )). 03/06/24  Yes Lanell Donald Stagger, PA-C  ondansetron  (ZOFRAN ) 8 MG tablet Take 1 tablet (8 mg total) by mouth every 8 (eight) hours as needed for nausea or vomiting. 03/06/24  Yes Lanell Donald Stagger, PA-C  pantoprazole  (PROTONIX ) 40 MG tablet Take 40 mg by mouth daily as needed (for stomach acid).   Yes [provider]  prochlorperazine   (COMPAZINE ) 10 MG tablet Take 0.5 tablets (5 mg total) by mouth every 6 (six) hours as needed for refractory nausea / vomiting. 03/22/24  Yes Cindy Garnette POUR, MD  rosuvastatin  (CRESTOR ) 20 MG tablet Take 0.5 tablets (10 mg total) by mouth daily. Patient taking differently: Take 10 mg  by mouth at bedtime. 12/23/23  Yes Briana Elgin LABOR, MD  amoxicillin -clavulanate (AUGMENTIN ) 875-125 MG tablet Take 1 tablet by mouth 2 (two) times daily for 2 days. Patient not taking: Reported on 03/23/2024 03/23/24 03/25/24  Cindy Garnette POUR, MD  capecitabine  (XELODA ) 500 MG tablet Take 2 tablets (1000mg ) by mouth twice daily within 30 mins of a meal. Take on Monday through Friday, during the days of radiation. Patient not taking: Reported on 03/23/2024 02/06/24   Pasam, Chinita, MD  losartan  (COZAAR ) 100 MG tablet Take 1 tablet (100 mg total) by mouth daily. Essential hypertension I10 Patient not taking: Reported on 03/23/2024 04/29/23   Eubanks, Jessica K, NP   Allergies  Allergen Reactions   Aricept  [Donepezil ] Diarrhea   Vioxx [Rofecoxib] Nausea Only    Pt. Can't remember what reaction was but asks to leave on her profile.    Review of Systems + nausea  Physical Exam Frail appearing Elderly lady resting in bed Regular work of breathing No edema  Vital Signs: BP 124/63 (BP Location: Right Arm)   Pulse 71   Temp 97.8 F (36.6 C) (Oral)   Resp 16   Ht 5' (1.524 m)   Wt 42.1 kg   SpO2 98%   BMI 18.13 kg/m  Pain Scale: 0-10 POSS *See Group Information*: 1-Acceptable,Awake and alert Pain Score: 8    SpO2: SpO2: 98 % O2 Device:SpO2: 98 % O2 Flow Rate: .   IO: Intake/output summary:  Intake/Output Summary (Last 24 hours) at 03/24/2024 1727 Last data filed at 03/24/2024 1246 Gross per 24 hour  Intake 1109.99 ml  Output 600 ml  Net 509.99 ml    LBM: Last BM Date : 03/22/24 Baseline Weight: Weight: 45 kg Most recent weight: Weight: 42.1 kg     Palliative Assessment/Data:   PPS 50%  Time In:   1630 Time Out:  1730 Time Total:  60 min Greater than 50%  of this time was spent counseling and coordinating care related to the above assessment and plan.  Signed by: Lonia Serve, MD   Please contact Palliative Medicine Team phone at [phone] for questions and concerns.  For individual provider: See Tracey

## 2024-03-24 NOTE — Progress Notes (Signed)
 PROGRESS NOTE    Caroline Snyder  FMW:[id number] DOB: 09-01-1940 DOA: 03/23/2024 PCP: [name], NP   Brief Narrative:  Caroline Snyder is a 83 y.o. female with medical history significant of abnormal chest CT, acute encephalopathy, altered mental status, history of memory loss, cholangiocarcinoma, external hemorrhoids, GERD, benign neoplasm of colon, depression, hyperlipidemia, hypertensive emergency, migraine headaches, osteoporosis who was discharged from the hospital yesterday after 6-day hospitalization due to acute colitis/ileitis, seen by GI and undergoing an ERCP on 03/19/2024 for biliary stent exchange who is returning to the hospital due to nausea, vomiting and diarrhea with development of abdominal pain radiating to her back.   Assessment and Plan:   Essential Hypertension Presenting with Hypertensive Urgency: Observation/telemetry changed to Inpt. Optimize pain control and stop IV Dilaudid  given confusion and start po Tramadol  50 mg q6hprn. Resume losartan  once tolerating p.o.   Nausea, vomiting and diarrhea Associated with Abdominal pain In the setting of chemo and Treatment for Primary cholangiocarcinoma of extrahepatic bile duct (HCC): IV Fluids now stopped. Clear liquid diet as tolerated advanced to FULL and now will advance to SOFT. C/w Analgesics as needed as above. Antiemetics as needed w/ Metclopramid,e Zofran  and Compazine . Pantoprazole  40 mg started. Resume Augmentin  Follow CBC, CMP in AM. Consult [facility] in a.m. if no improvement.  Acute Colitis/ileitis: Resumed Abx as above  Electrolyte Abnormalities: Na+ is 133, Phos is 2.1, and Mg is 1.5. Replete w/ IV K Phos  15 mmol and IV Mag Sulfate 4 mg. CTM and Replete as Necessary. Repeat CMP, Mag, Phos in the AM   Hyperlipidemia: On Rosuvastatin  10 mg p.o. daily. Will hold until symptoms better.   Depression, Recurrent (HCC) /  Vascular Dementia (HCC): Supportive care. Avoid benzodiazepines. Palliative Consulted  and will continue delirium precautions  Normocytic Anemia: Secondary to hemodilution and chemotherapy Hgb/Hct Trend:  Recent Labs  Lab 03/18/24 0642 03/19/24 0815 03/20/24 0640 03/21/24 0533 03/22/24 0613 03/23/24 0902 03/24/24 0842  HGB 7.9* 9.4* 9.6* 9.2* 9.8* 12.1 10.7*  HCT 23.8* 28.7* 28.0* 27.5* 29.4* 36.1 33.4*  MCV 95.2 93.5 92.4 93.9 94.2 95.5 97.7  - Recent anemia panel done showed an iron level 45, TIBC 237, ferritin level of 86, folate level of 16.8 and a vitamin B12 of 328. CTM for S/Sx of Bleeding; No overt bleeding noted. Repeat CBC in the AM   GERD (gastroesophageal reflux disease)/GI Prophylaxis: C/w IV Pantoprazole  40 mg daily  Hypoalbuminemia: Patient's Albumin Trend: Recent Labs  Lab 03/16/24 2007 03/18/24 9357 03/19/24 0815 03/20/24 0640 03/22/24 0613 03/23/24 0902 03/24/24 0842  ALBUMIN 3.6 2.5* 3.2* 3.1*  3.1* 2.9* 3.3* 3.0*  -CTM and Trend and repeat CMP in the AM   DVT prophylaxis: SCDs Start: 03/23/24 1652    Code Status: Limited: Do not attempt resuscitation (DNR) -DNR-LIMITED -Do Not Intubate/DNI  Family Communication: D/w Son @ bedside  Disposition Plan:  Level of care: Telemetry Status is: Inpatient Remains inpatient appropriate because: Needs further clinical improvement and tolerance of diet   Consultants:  Palliative care medicine  Procedures:  As delineated as above  Antimicrobials:  Anti-infectives (From admission, onward)    Start     Dose/Rate Route Frequency Ordered Stop   03/24/24 1345  amoxicillin -clavulanate (AUGMENTIN ) 875-125 MG per tablet 1 tablet        1 tablet Oral Every 12 hours 03/24/24 1248         Subjective: Seen and examined at bedside and was more awake and alert and oriented.  Felt  fatigued.  Having some abdominal discomfort and had nausea earlier but improved slightly.  Diet slowly advanced.  No chest pain or lightheadedness or dizziness.  No other concerns or complaints at this  time.  Objective: Vitals:   03/24/24 0850 03/24/24 1335 03/24/24 1802 03/24/24 2033  BP: (!) 136/59 124/63 137/66 (!) 159/81  Pulse: 79 71 78 77  Resp: 14 16  18   Temp:  97.8 F (36.6 C)  (!) 97.5 F (36.4 C)  TempSrc:  Oral  Oral  SpO2: 98% 98% 98% 99%  Weight:      Height:        Intake/Output Summary (Last 24 hours) at 03/24/2024 2113 Last data filed at 03/24/2024 8144 Gross per 24 hour  Intake 1361.83 ml  Output 600 ml  Net 761.83 ml   Filed Weights   03/23/24 0850 03/23/24 1725  Weight: 45 kg 42.1 kg   Examination: Physical Exam:  Constitutional: Thin chronically ill-appearing elderly Caucasian female who appears a little uncomfortable Respiratory: Diminished to auscultation bilaterally with some coarse breath sounds, no wheezing, rales, rhonchi or crackles. Normal respiratory effort and patient is not tachypenic. No accessory muscle use.  Unlabored breathing Cardiovascular: RRR, no murmurs / rubs / gallops. S1 and S2 auscultated. No extremity edema Abdomen: Soft, a little-tender, non-distended. Bowel sounds positive.  GU: Deferred. Musculoskeletal: No clubbing / cyanosis of digits/nails. No joint deformity upper and lower extremities. Skin: No rashes, lesions, ulcers on limited skin evaluation. No induration; Warm and dry.  Neurologic: CN 2-12 grossly intact with no focal deficits.  Romberg sign cerebellar reflexes not assessed.  Psychiatric: She is awake and alert but fatigued appearing  Data Reviewed: I have personally reviewed following labs and imaging studies  CBC: Recent Labs  Lab 03/19/24 0815 03/20/24 0640 03/21/24 0533 03/22/24 0613 03/23/24 0902 03/24/24 0842  WBC 3.4* 5.4 3.4* 4.2 8.1 7.4  NEUTROABS 2.5 4.4  --   --  7.3  --   HGB 9.4* 9.6* 9.2* 9.8* 12.1 10.7*  HCT 28.7* 28.0* 27.5* 29.4* 36.1 33.4*  MCV 93.5 92.4 93.9 94.2 95.5 97.7  PLT 149* 158 159 162 196 188   Basic Metabolic Panel: Recent Labs  Lab 03/18/24 0642 03/19/24 0815  03/20/24 0640 03/21/24 0533 03/22/24 0613 03/23/24 0902 03/24/24 0842  NA 138 139 138 135 136 139 133*  K 3.1* 4.0 3.5 3.5 3.7 3.8 3.8  CL 113* 112* 108 106 105 102 103  CO2 21* 20* 20* 23 25 22  20*  GLUCOSE 93 104* 121* 101* 121* 181* 141*  BUN 6* <5* <5* <5* 8 13 16   CREATININE 0.68 0.66 0.59 0.70 0.65 0.64 0.58  CALCIUM  7.2* 8.2* 8.5* 8.5* 8.7* 9.1 8.8*  MG 1.4* 2.4 1.8  --   --   --  1.5*  PHOS 2.3* 2.4* 2.3*  --   --   --  2.1*   GFR: Estimated Creatinine Clearance: 34.8 mL/min (by C-G formula based on SCr of 0.58 mg/dL). Liver Function Tests: Recent Labs  Lab 03/19/24 0815 03/20/24 0640 03/22/24 0613 03/23/24 0902 03/24/24 0842  AST  --  35 35 45* 41  ALT  --  18 26 29 28   ALKPHOS  --  36* 34* 45 40  BILITOT  --  0.3 0.5 1.0 0.7  PROT  --  5.4* 5.1* 6.2* 5.6*  ALBUMIN 3.2* 3.1*  3.1* 2.9* 3.3* 3.0*   Recent Labs  Lab 03/23/24 0902  LIPASE 24   No results for input(s): AMMONIA  in the last 168 hours. Coagulation Profile: No results for input(s): INR, PROTIME in the last 168 hours. Cardiac Enzymes: No results for input(s): CKTOTAL, CKMB, CKMBINDEX, TROPONINI in the last 168 hours. BNP (last 3 results) No results for input(s): PROBNP in the last 8760 hours. HbA1C: No results for input(s): HGBA1C in the last 72 hours. CBG: No results for input(s): GLUCAP in the last 168 hours. Lipid Profile: No results for input(s): CHOL, HDL, LDLCALC, TRIG, CHOLHDL, LDLDIRECT in the last 72 hours. Thyroid  Function Tests: No results for input(s): TSH, T4TOTAL, FREET4, T3FREE, THYROIDAB in the last 72 hours. Anemia Panel: No results for input(s): VITAMINB12, FOLATE, FERRITIN, TIBC, IRON, RETICCTPCT in the last 72 hours. Sepsis Labs: No results for input(s): PROCALCITON, LATICACIDVEN in the last 168 hours.  Recent Results (from the past 240 hours)  C Difficile Quick Screen w PCR reflex     Status: None   Collection  Time: 03/17/24 12:37 AM   Specimen: Stool  Result Value Ref Range Status   C Diff antigen NEGATIVE NEGATIVE Final   C Diff toxin NEGATIVE NEGATIVE Final   C Diff interpretation No C. difficile detected.  Final    Comment: Performed at [facility], [address], [place], [place] [address]  Gastrointestinal Panel by PCR , Stool     Status: None   Collection Time: 03/18/24 12:37 AM   Specimen: Stool  Result Value Ref Range Status   Campylobacter species NOT DETECTED NOT DETECTED Final   Plesimonas shigelloides NOT DETECTED NOT DETECTED Final   Salmonella species NOT DETECTED NOT DETECTED Final   Yersinia enterocolitica NOT DETECTED NOT DETECTED Final   Vibrio species NOT DETECTED NOT DETECTED Final   Vibrio cholerae NOT DETECTED NOT DETECTED Final   Enteroaggregative E coli (EAEC) NOT DETECTED NOT DETECTED Final   Enteropathogenic E coli (EPEC) NOT DETECTED NOT DETECTED Final   Enterotoxigenic E coli (ETEC) NOT DETECTED NOT DETECTED Final   Shiga like toxin producing E coli (STEC) NOT DETECTED NOT DETECTED Final   Shigella/Enteroinvasive E coli (EIEC) NOT DETECTED NOT DETECTED Final   Cryptosporidium NOT DETECTED NOT DETECTED Final   Cyclospora cayetanensis NOT DETECTED NOT DETECTED Final   Entamoeba histolytica NOT DETECTED NOT DETECTED Final   Giardia lamblia NOT DETECTED NOT DETECTED Final   Adenovirus F40/41 NOT DETECTED NOT DETECTED Final   Astrovirus NOT DETECTED NOT DETECTED Final   Norovirus GI/GII NOT DETECTED NOT DETECTED Final   Rotavirus A NOT DETECTED NOT DETECTED Final   Sapovirus (I, II, IV, and V) NOT DETECTED NOT DETECTED Final    Comment: Performed at [facility], [address], [place], [place] [address]    Radiology Studies: MR LUMBAR SPINE W WO CONTRAST Result Date: 03/23/2024 CLINICAL DATA:  History of biliary cancer. Question metastatic disease. Back pain. EXAM: MRI LUMBAR SPINE WITHOUT AND WITH CONTRAST TECHNIQUE: Multiplanar  and multiecho pulse sequences of the lumbar spine were obtained without and with intravenous contrast. CONTRAST:  5mL GADAVIST  GADOBUTROL  1 MMOL/ML IV SOLN COMPARISON:  Radiography 11/17/2021 FINDINGS: Segmentation:  5 lumbar type vertebral bodies. Alignment: Degenerative anterolisthesis of 4 mm at L3-4 and 6 mm at L4-5. Vertebrae: No fracture or focal bone lesion. No evidence of regional metastatic disease. Edema and enhancement of the facet arthropathy at L3-4 and L4-5. Conus medullaris and cauda equina: Conus extends to the L1 level. Conus and cauda equina appear normal. Paraspinal and other soft tissues: Negative Disc levels: No significant finding from T11-12 through L1-2.  L2-3: Mild disc bulge. Moderate bilateral facet degeneration and hypertrophy. Mild canal narrowing but no neural compression. L3-4: Advanced bilateral facet arthropathy with 4 mm of degenerative anterolisthesis. Broad-based disc herniation. Moderate to severe multifactorial stenosis that could cause neural compression on either or both sides. This is slightly worse on the right. L4-5: Advanced bilateral facet arthropathy with anterolisthesis of 6 mm. Shallow protrusion of the disc. Moderate multifactorial stenosis with potential for neural compression in the lateral recesses. This is more pronounced on the right. Moderate foraminal narrowing on the right as well. L5-S1: No disc abnormality.  Mild facet degeneration.  No stenosis. IMPRESSION: 1. No evidence of metastatic disease to the lumbar spine. 2. L3-4: Advanced bilateral facet arthropathy with 4 mm of anterolisthesis. Broad-based disc herniation. Moderate to severe multifactorial stenosis that could cause neural compression on either or both sides. This is slightly worse on the right. The facet arthritis could certainly be painful. 3. L4-5: Advanced bilateral facet arthropathy with 6 mm of anterolisthesis. Shallow protrusion of the disc. Moderate multifactorial stenosis with potential  for neural compression in the lateral recesses, more pronounced on the right. Moderate foraminal narrowing on the right as well. The facet arthritis could be painful. 4. L2-3: Disc bulge. Moderate bilateral facet degeneration and hypertrophy. Mild canal narrowing but no neural compression. 5. L5-S1: Mild facet degeneration. No stenosis. Electronically Signed   By: Oneil Officer M.D.   On: 03/23/2024 12:54   MR THORACIC SPINE W WO CONTRAST Result Date: 03/23/2024 CLINICAL DATA:  Metastatic disease evaluation. Bile duct cancer. Surgery yesterday. EXAM: MRI THORACIC WITHOUT AND WITH CONTRAST TECHNIQUE: Multiplanar and multiecho pulse sequences of the thoracic spine were obtained without and with intravenous contrast. CONTRAST:  5mL GADAVIST  GADOBUTROL  1 MMOL/ML IV SOLN COMPARISON:  None Available. FINDINGS: Alignment:  No significant malalignment.  Minimal scoliosis. Vertebrae: No fracture or focal bone lesion. No evidence of bony metastatic disease. Cord:  No cord compression or metastatic disease to the cord. Paraspinal and other soft tissues: Tiny pleural effusions layering dependently. Disc levels: No significant disc level finding. No compressive stenosis of the canal or foramina. 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 IMPRESSION: 1. No evidence of metastatic disease to the thoracic spine. 2. Tiny pleural effusions layering dependently. Electronically Signed   By: Oneil Officer M.D.   On: 03/23/2024 12:49   CT Angio Abdomen W and/or Wo Contrast Result Date: 03/23/2024 CLINICAL DATA:  Aortic aneurysm suspected. Nausea vomiting and diarrhea. Abdominal pain radiating to back. History of bile duct cancer. * Tracking Code: BO * EXAM: CT ANGIOGRAPHY ABDOMEN TECHNIQUE: Multidetector CT imaging of the abdomen was performed using the standard protocol during bolus administration of intravenous contrast. Multiplanar reconstructed images and MIPs were obtained and  reviewed to evaluate the vascular anatomy. RADIATION DOSE REDUCTION: This exam was performed according to the departmental dose-optimization program which includes automated exposure control, adjustment of the mA and/or kV according to patient size and/or use of iterative reconstruction technique. CONTRAST:  90mL OMNIPAQUE  IOHEXOL  350 MG/ML SOLN COMPARISON:  CT scan abdomen and pelvis from 03/16/2024. FINDINGS: VASCULAR Aorta: Normal caliber aorta without aneurysm, dissection, vasculitis or significant stenosis. Celiac: Patent without evidence of aneurysm, dissection, vasculitis or significant stenosis. SMA: Patent without evidence of aneurysm, dissection, vasculitis or significant stenosis. 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 All the renal arteries are patent without evidence of aneurysm, dissection, vasculitis, fibromuscular dysplasia or significant stenosis. IMA: Small caliber.  Patent without evidence of aneurysm, dissection, vasculitis or significant stenosis. Inflow: Patent without evidence of aneurysm, dissection, vasculitis or significant stenosis. Veins: No obvious venous abnormality within the limitations of this arterial phase study. Review of the MIP images confirms the above findings. NON-VASCULAR Lower chest: There are patchy atelectatic changes in the visualized lung bases. No overt consolidation. No pleural effusion. The heart is normal in size. No pericardial effusion. Hepatobiliary: The liver is normal in size. Non-cirrhotic configuration. No suspicious mass. Since the prior study, there is exchange of common bile duct stent. There is new wider stent extending from the common hepatic duct into the second part of duodenum. There is resultant pneumobilia mainly in the left hepatic lobe. No intrahepatic bile duct dilation. Very small volume 2-3 mm calcified gallstones noted in the fundus region without imaging signs of acute cholecystitis. Normal gallbladder  wall thickness. No pericholecystic inflammatory changes. Pancreas: Unremarkable. No pancreatic ductal dilatation or surrounding inflammatory changes. Spleen: Within normal limits. No focal lesion. Adrenals/Urinary Tract: Adrenal glands are unremarkable. No suspicious renal mass. No nephroureterolithiasis or obstructive uropathy on either side Stomach/Bowel: No disproportionate dilation of the small or large bowel loops. No evidence of abnormal bowel wall thickening or inflammatory changes. The appendix is unremarkable. Vascular/Lymphatic: No ascites or pneumoperitoneum. No abdominal or pelvic lymphadenopathy, by size criteria. No aneurysmal dilation of the major abdominal arteries. There are mild peripheral atherosclerotic vascular calcifications of the aorta and its major branches. Other: There is a tiny fat containing umbilical hernia. The soft tissues and abdominal wall are otherwise unremarkable. Musculoskeletal: No suspicious osseous lesions. There are mild multilevel degenerative changes in the visualized spine. IMPRESSION: 1. No acute inflammatory process identified within the abdomen or pelvis. 2. No aortic aneurysm, dissection or periaortic fat stranding. 3. Multiple other nonacute observations, as described above. Aortic Atherosclerosis (ICD10-I70.0). Electronically Signed   By: Ree Molt M.D.   On: 03/23/2024 10:26   Scheduled Meds:  amoxicillin -clavulanate  1 tablet Oral Q12H   metoCLOPramide  (REGLAN ) injection  5 mg Intravenous Q8H   metoprolol  tartrate  5 mg Intravenous Q8H   ondansetron   4 mg Oral Q8H   pantoprazole  (PROTONIX ) IV  40 mg Intravenous Q24H   Continuous Infusions:   LOS: 0 days   Alejandro Marker, DO [facility] Available via Epic secure chat 7am-7pm After these hours, please refer to coverage provider listed on [email or web address] 03/24/2024, 9:13 PM

## 2024-03-24 NOTE — Hospital Course (Addendum)
 Caroline Snyder is a 83 y.o. female with medical history significant of abnormal chest CT, acute encephalopathy, altered mental status, history of memory loss, cholangiocarcinoma, external hemorrhoids, GERD, benign neoplasm of colon, depression, hyperlipidemia, hypertensive emergency, migraine headaches, osteoporosis who was discharged from the hospital yesterday after 6-day hospitalization due to acute colitis/ileitis, seen by GI and undergoing an ERCP on 03/19/2024 for biliary stent exchange who is returning to the hospital due to nausea, vomiting and diarrhea with development of abdominal pain radiating to her back.   Assessment and Plan:   Essential Hypertension Presenting with Hypertensive Urgency: Observation/telemetry changed to Inpt. Optimize pain control and stop IV Dilaudid  given confusion and start po Tramadol  50 mg q6hprn. Resume losartan  once tolerating p.o.   Nausea, vomiting and diarrhea Associated with Abdominal pain In the setting of chemo and Treatment for Primary cholangiocarcinoma of extrahepatic bile duct (HCC): IV Fluids now stopped. Clear liquid diet as tolerated advanced to FULL and now will advance to SOFT. C/w Analgesics as needed as above. Antiemetics as needed w/ Metclopramid,e Zofran  and Compazine . Pantoprazole  40 mg started. Resume Augmentin  Follow CBC, CMP in AM. Consult [facility] in a.m. if no improvement.  Acute Colitis/ileitis: Resumed Abx as above  Electrolyte Abnormalities: Na+ is 133, Phos is 2.1, and Mg is 1.5. Replete w/ IV K Phos  15 mmol and IV Mag Sulfate 4 mg. CTM and Replete as Necessary. Repeat CMP, Mag, Phos in the AM   Hyperlipidemia: On Rosuvastatin  10 mg p.o. daily. Will hold until symptoms better.   Depression, Recurrent (HCC) /  Vascular Dementia (HCC): Supportive care. Avoid benzodiazepines. Palliative Consulted and will continue delirium precautions  Normocytic Anemia: Secondary to hemodilution and chemotherapy Hgb/Hct Trend:  Recent Labs   Lab 03/18/24 0642 03/19/24 0815 03/20/24 0640 03/21/24 0533 03/22/24 0613 03/23/24 0902 03/24/24 0842  HGB 7.9* 9.4* 9.6* 9.2* 9.8* 12.1 10.7*  HCT 23.8* 28.7* 28.0* 27.5* 29.4* 36.1 33.4*  MCV 95.2 93.5 92.4 93.9 94.2 95.5 97.7  - Recent anemia panel done showed an iron level 45, TIBC 237, ferritin level of 86, folate level of 16.8 and a vitamin B12 of 328. CTM for S/Sx of Bleeding; No overt bleeding noted. Repeat CBC in the AM   GERD (gastroesophageal reflux disease)/GI Prophylaxis: C/w IV Pantoprazole  40 mg daily  Hypoalbuminemia: Patient's Albumin Trend: Recent Labs  Lab 03/16/24 2007 03/18/24 9357 03/19/24 0815 03/20/24 0640 03/22/24 0613 03/23/24 0902 03/24/24 0842  ALBUMIN 3.6 2.5* 3.2* 3.1*  3.1* 2.9* 3.3* 3.0*  -CTM and Trend and repeat CMP in the AM  Underweight / Severe Protein Calorie Malnutrition: Complicates overall prognosis and care. Estimated body mass index is 18.13 kg/m as calculated from the following:   Height as of this encounter: 5' (1.524 m).   Weight as of this encounter: 42.1 kg. Nutritionist Consulted.

## 2024-03-25 DIAGNOSIS — F339 Major depressive disorder, recurrent, unspecified: Secondary | ICD-10-CM | POA: Diagnosis not present

## 2024-03-25 DIAGNOSIS — I10 Essential (primary) hypertension: Secondary | ICD-10-CM | POA: Diagnosis not present

## 2024-03-25 DIAGNOSIS — E782 Mixed hyperlipidemia: Secondary | ICD-10-CM

## 2024-03-25 DIAGNOSIS — R109 Unspecified abdominal pain: Secondary | ICD-10-CM | POA: Diagnosis not present

## 2024-03-25 DIAGNOSIS — I160 Hypertensive urgency: Secondary | ICD-10-CM | POA: Diagnosis not present

## 2024-03-25 LAB — COMPREHENSIVE METABOLIC PANEL WITH GFR
ALT: 27 U/L (ref 0–44)
AST: 35 U/L (ref 15–41)
Albumin: 2.8 g/dL — ABNORMAL LOW (ref 3.5–5.0)
Alkaline Phosphatase: 35 U/L — ABNORMAL LOW (ref 38–126)
Anion gap: 10 (ref 5–15)
BUN: 11 mg/dL (ref 8–23)
CO2: 22 mmol/L (ref 22–32)
Calcium: 8.2 mg/dL — ABNORMAL LOW (ref 8.9–10.3)
Chloride: 103 mmol/L (ref 98–111)
Creatinine, Ser: 0.67 mg/dL (ref 0.44–1.00)
GFR, Estimated: >60 mL/min (ref >60)
Glucose, Bld: 108 mg/dL — ABNORMAL HIGH (ref 70–99)
Potassium: 3.5 mmol/L (ref 3.5–5.1)
Sodium: 135 mmol/L (ref 135–145)
Total Bilirubin: 0.4 mg/dL (ref 0.0–1.2)
Total Protein: 4.9 g/dL — ABNORMAL LOW (ref 6.5–8.1)

## 2024-03-25 LAB — CBC WITH DIFFERENTIAL/PLATELET
Abs Immature Granulocytes: 0.03 K/uL (ref 0.00–0.07)
Basophils Absolute: 0 K/uL (ref 0.0–0.1)
Basophils Relative: 1 %
Eosinophils Absolute: 0.1 K/uL (ref 0.0–0.5)
Eosinophils Relative: 2 %
HCT: 29.4 % — ABNORMAL LOW (ref 36.0–46.0)
Hemoglobin: 9.3 g/dL — ABNORMAL LOW (ref 12.0–15.0)
Immature Granulocytes: 1 %
Lymphocytes Relative: 4 %
Lymphs Abs: 0.2 K/uL — ABNORMAL LOW (ref 0.7–4.0)
MCH: 31.3 pg (ref 26.0–34.0)
MCHC: 31.6 g/dL (ref 30.0–36.0)
MCV: 99 fL (ref 80.0–100.0)
Monocytes Absolute: 0.5 K/uL (ref 0.1–1.0)
Monocytes Relative: 11 %
Neutro Abs: 3.8 K/uL (ref 1.7–7.7)
Neutrophils Relative %: 81 %
Platelets: 166 K/uL (ref 150–400)
RBC: 2.97 MIL/uL — ABNORMAL LOW (ref 3.87–5.11)
RDW: 17.2 % — ABNORMAL HIGH (ref 11.5–15.5)
WBC: 4.6 K/uL (ref 4.0–10.5)
nRBC: 0 % (ref 0.0–0.2)

## 2024-03-25 LAB — MAGNESIUM: Magnesium: 2.2 mg/dL (ref 1.7–2.4)

## 2024-03-25 LAB — PHOSPHORUS: Phosphorus: 2.5 mg/dL (ref 2.5–4.6)

## 2024-03-25 MED ORDER — PANTOPRAZOLE SODIUM 40 MG PO TBEC: 40.0000 mg | DELAYED_RELEASE_TABLET | Freq: Every day | ORAL | Status: DC | PRN

## 2024-03-25 MED ORDER — ROSUVASTATIN CALCIUM 10 MG PO TABS
10.0000 mg | ORAL_TABLET | Freq: Every day | ORAL | Status: DC
  Administered 2024-03-25 – 2024-03-27 (×3): 10 mg via ORAL
  Filled 2024-03-25 (×3): qty 1

## 2024-03-25 MED ORDER — ACETAMINOPHEN 500 MG PO TABS
1000.0000 mg | ORAL_TABLET | Freq: Three times a day (TID) | ORAL | Status: DC
  Administered 2024-03-25 – 2024-03-28 (×10): 1000 mg via ORAL
  Filled 2024-03-25 (×10): qty 2

## 2024-03-25 MED ORDER — POTASSIUM CHLORIDE CRYS ER 20 MEQ PO TBCR
40.0000 meq | EXTENDED_RELEASE_TABLET | Freq: Two times a day (BID) | ORAL | Status: AC
Start: 2024-03-25 — End: 2024-03-25
  Administered 2024-03-25 (×2): 40 meq via ORAL
  Filled 2024-03-25 (×2): qty 2

## 2024-03-25 MED ORDER — LOSARTAN POTASSIUM 50 MG PO TABS
50.0000 mg | ORAL_TABLET | Freq: Every day | ORAL | Status: DC
  Administered 2024-03-25 – 2024-03-28 (×4): 50 mg via ORAL
  Filled 2024-03-25 (×4): qty 1

## 2024-03-25 MED ORDER — OXYCODONE HCL 5 MG PO TABS
2.5000 mg | ORAL_TABLET | ORAL | Status: DC | PRN
  Administered 2024-03-25 – 2024-03-26 (×4): 2.5 mg via ORAL
  Filled 2024-03-25 (×4): qty 1

## 2024-03-25 NOTE — Progress Notes (Signed)
 PROGRESS NOTE    Caroline Snyder  FMW:[id number] DOB: Apr 03, 1941 DOA: 03/23/2024 PCP: [name], NP   Brief Narrative:  Caroline Snyder is a 83 y.o. female with medical history significant of abnormal chest CT, acute encephalopathy, altered mental status, history of memory loss, cholangiocarcinoma, external hemorrhoids, GERD, benign neoplasm of colon, depression, hyperlipidemia, hypertensive emergency, migraine headaches, osteoporosis who was discharged from the hospital yesterday after 6-day hospitalization due to acute colitis/ileitis, seen by GI and undergoing an ERCP on 03/19/2024 for biliary stent exchange who is returning to the hospital due to nausea, vomiting and diarrhea with development of abdominal pain radiating to her back.   Abdominal pain and nausea has been improved and her diet has been and went from clears to full.  Tolerated full's and went to soft and per son she is tolerating soft his morning.  Palliative is adjusting further pain regimen  Assessment and Plan:   Nausea, vomiting and diarrhea Associated with Abdominal pain In the setting of chemo and Treatment for Primary cholangiocarcinoma of extrahepatic bile duct (HCC): IV Fluids now stopped. Clear liquid diet as tolerated advanced to FULL and now will advance to SOFT.  Per son she tolerated her soft this morning and we will continue to monitor.  C/w Analgesics as needed as above. Antiemetics as needed w/ Metclopramide Zofran  and Compazine .  Palliative is also added Zofran  ODT scheduled.  Continuing Pantoprazole  40 mg but changed to p.o. Resume Augmentin . Stopped IV Dilaudid  given confusion and started po Tramadol  50 mg q6hprn however po was discontinued by the palliative care team and now they have added p.o. Oxy IR as needed.  Palliative is also scheduling acetaminophen . Continue to monitor and Follow CBC, CMP in AM. Consult [facility] in a.m. if no improvement.  Essential Hypertension Presenting with Hypertensive  Urgency: Observation/telemetry changed to Inpt. Optimize pain control.  Resume home losartan  but per [facility] she is not longer taking it.  Will resume at half the dose at 50 mg daily. CTM BP per Protocol. Last BP reading was 157/74  Acute Colitis/Ileitis: Resumed Abx as above; see pain control as above  Electrolyte Abnormalities: Improved. Na+ went from 133 -> 135, Phos went from 2.1 -> 2.5, and Mg is 1.5 -> 2.2. Replete w/ IV K Phos  15 mmol and IV Mag Sulfate 4 mg. CTM and Replete as Necessary. Repeat CMP, Mag, Phos in the AM   Hyperlipidemia: Resumed Rosuvastatin  10 mg p.o. daily.   Depression, Recurrent (HCC) /  Vascular Dementia (HCC): Supportive care. Avoid benzodiazepines and will hold lorazepam  0.5 mg tablets that she takes every 6 as needed. Palliative Consulted and will continue delirium precautions  Normocytic Anemia: Secondary to hemodilution and chemotherapy Hgb/Hct Trend:  Recent Labs  Lab 03/19/24 0815 03/20/24 0640 03/21/24 0533 03/22/24 9386 03/23/24 0902 03/24/24 0842 03/25/24 0656  HGB 9.4* 9.6* 9.2* 9.8* 12.1 10.7* 9.3*  HCT 28.7* 28.0* 27.5* 29.4* 36.1 33.4* 29.4*  MCV 93.5 92.4 93.9 94.2 95.5 97.7 99.0  -Recent Anemia Panel done showed an iron level 45, TIBC 237, ferritin level of 86, folate level of 16.8 and a vitamin B12 of 328. CTM for S/Sx of Bleeding; No overt bleeding noted. Repeat CBC in the AM   GERD (gastroesophageal reflux disease)/GI Prophylaxis: IV Pantoprazole  40 mg daily is going to be changed to p.o. 40 mg po Daily   Hypoalbuminemia: Patient's Albumin Trend: Recent Labs  Lab 03/18/24 9357 03/19/24 0815 03/20/24 9359 03/22/24 9386 03/23/24 0902 03/24/24 9157 03/25/24 9343  ALBUMIN 2.5* 3.2* 3.1*  3.1* 2.9* 3.3* 3.0* 2.8*  -CTM and Trend and repeat CMP in the AM  Underweight / Severe Protein Calorie Malnutrition: Complicates overall prognosis and care. Estimated body mass index is 18.13 kg/m as calculated from the following:   Height as of  this encounter: 5' (1.524 m).   Weight as of this encounter: 42.1 kg. Nutritionist Consulted and awaiting evaluation    DVT prophylaxis: SCDs Start: 03/23/24 1652    Code Status: Limited: Do not attempt resuscitation (DNR) -DNR-LIMITED -Do Not Intubate/DNI  Family Communication: D/w Son @ bedside  Disposition Plan:  Level of care: Telemetry Status is: Inpatient Remains inpatient appropriate because: Needs further clinical improvement in her status and tolerance of diet as well as evaluation by PT and OT   Consultants:  Palliative Care Notified Oncology via Epic  Procedures:  As delineated as above  Antimicrobials:  Anti-infectives (From admission, onward)    Start     Dose/Rate Route Frequency Ordered Stop   03/24/24 1345  amoxicillin -clavulanate (AUGMENTIN ) 875-125 MG per tablet 1 tablet        1 tablet Oral Every 12 hours 03/24/24 1248         Subjective: Seen and examined at bedside and the son thinks that she is doing better than she was more awake and alert.  He states that she was eating a little bit better today.  Not hurting as much.  Had some nausea earlier but no vomiting.  No lightheadedness or dizziness.  No other concerns or complaints at this time.  Objective: Vitals:   03/24/24 2033 03/25/24 0001 03/25/24 0624 03/25/24 1401  BP: (!) 159/81 (!) 165/70 (!) 166/76 (!) 157/74  Pulse: 77  81 72  Resp: 18 16 18 20   Temp: (!) 97.5 F (36.4 C)  98.4 F (36.9 C) 97.8 F (36.6 C)  TempSrc: Oral  Oral Oral  SpO2: 99%  99% 99%  Weight:      Height:        Intake/Output Summary (Last 24 hours) at 03/25/2024 1858 Last data filed at 03/25/2024 1845 Gross per 24 hour  Intake 360 ml  Output 2000 ml  Net -1640 ml   Filed Weights   03/23/24 0850 03/23/24 1725  Weight: 45 kg 42.1 kg   Examination: Physical Exam:  Constitutional: Thin chronically ill-appearing Caucasian elderly female who appears more comfortable and more awake today compared to yesterday and not  in acute distress Respiratory: Diminished to auscultation bilaterally with some coarse breath sounds, no wheezing, rales, rhonchi or crackles. Normal respiratory effort and patient is not tachypenic. No accessory muscle use.  Unlabored breathing Cardiovascular: RRR, no murmurs / rubs / gallops. S1 and S2 auscultated. No extremity edema. Abdomen: Soft, non-tender, non-distended. Bowel sounds positive.  GU: Deferred. Musculoskeletal: No clubbing / cyanosis of digits/nails. No joint deformity upper and lower extremities. Skin: No rashes, lesions, ulcers on limited skin evaluation. No induration; Warm and dry.  Neurologic: CN 2-12 grossly intact with no focal deficits. Romberg sign and cerebellar reflexes not assessed.  Psychiatric: Alert and oriented x 3.  Appears calm  Data Reviewed: I have personally reviewed following labs and imaging studies  CBC: Recent Labs  Lab 03/19/24 0815 03/20/24 0640 03/21/24 0533 03/22/24 9386 03/23/24 0902 03/24/24 0842 03/25/24 0656  WBC 3.4* 5.4 3.4* 4.2 8.1 7.4 4.6  NEUTROABS 2.5 4.4  --   --  7.3  --  3.8  HGB 9.4* 9.6* 9.2* 9.8* 12.1 10.7* 9.3*  HCT  28.7* 28.0* 27.5* 29.4* 36.1 33.4* 29.4*  MCV 93.5 92.4 93.9 94.2 95.5 97.7 99.0  PLT 149* 158 159 162 196 188 166   Basic Metabolic Panel: Recent Labs  Lab 03/19/24 0815 03/20/24 0640 03/21/24 0533 03/22/24 0613 03/23/24 0902 03/24/24 0842 03/25/24 0656  NA 139 138 135 136 139 133* 135  K 4.0 3.5 3.5 3.7 3.8 3.8 3.5  CL 112* 108 106 105 102 103 103  CO2 20* 20* 23 25 22  20* 22  GLUCOSE 104* 121* 101* 121* 181* 141* 108*  BUN <5* <5* <5* 8 13 16 11   CREATININE 0.66 0.59 0.70 0.65 0.64 0.58 0.67  CALCIUM  8.2* 8.5* 8.5* 8.7* 9.1 8.8* 8.2*  MG 2.4 1.8  --   --   --  1.5* 2.2  PHOS 2.4* 2.3*  --   --   --  2.1* 2.5   GFR: Estimated Creatinine Clearance: 34.8 mL/min (by C-G formula based on SCr of 0.67 mg/dL). Liver Function Tests: Recent Labs  Lab 03/20/24 0640 03/22/24 0613  03/23/24 0902 03/24/24 0842 03/25/24 0656  AST 35 35 45* 41 35  ALT 18 26 29 28 27   ALKPHOS 36* 34* 45 40 35*  BILITOT 0.3 0.5 1.0 0.7 0.4  PROT 5.4* 5.1* 6.2* 5.6* 4.9*  ALBUMIN 3.1*  3.1* 2.9* 3.3* 3.0* 2.8*   Recent Labs  Lab 03/23/24 0902  LIPASE 24   No results for input(s): AMMONIA in the last 168 hours. Coagulation Profile: No results for input(s): INR, PROTIME in the last 168 hours. Cardiac Enzymes: No results for input(s): CKTOTAL, CKMB, CKMBINDEX, TROPONINI in the last 168 hours. BNP (last 3 results) No results for input(s): PROBNP in the last 8760 hours. HbA1C: No results for input(s): HGBA1C in the last 72 hours. CBG: No results for input(s): GLUCAP in the last 168 hours. Lipid Profile: No results for input(s): CHOL, HDL, LDLCALC, TRIG, CHOLHDL, LDLDIRECT in the last 72 hours. Thyroid  Function Tests: No results for input(s): TSH, T4TOTAL, FREET4, T3FREE, THYROIDAB in the last 72 hours. Anemia Panel: No results for input(s): VITAMINB12, FOLATE, FERRITIN, TIBC, IRON, RETICCTPCT in the last 72 hours. Sepsis Labs: No results for input(s): PROCALCITON, LATICACIDVEN in the last 168 hours.  Recent Results (from the past 240 hours)  C Difficile Quick Screen w PCR reflex     Status: None   Collection Time: 03/17/24 12:37 AM   Specimen: Stool  Result Value Ref Range Status   C Diff antigen NEGATIVE NEGATIVE Final   C Diff toxin NEGATIVE NEGATIVE Final   C Diff interpretation No C. difficile detected.  Final    Comment: Performed at [facility], [address] [place], [place] [address]  Gastrointestinal Panel by PCR , Stool     Status: None   Collection Time: 03/18/24 12:37 AM   Specimen: Stool  Result Value Ref Range Status   Campylobacter species NOT DETECTED NOT DETECTED Final   Plesimonas shigelloides NOT DETECTED NOT DETECTED Final   Salmonella species NOT DETECTED NOT DETECTED  Final   Yersinia enterocolitica NOT DETECTED NOT DETECTED Final   Vibrio species NOT DETECTED NOT DETECTED Final   Vibrio cholerae NOT DETECTED NOT DETECTED Final   Enteroaggregative E coli (EAEC) NOT DETECTED NOT DETECTED Final   Enteropathogenic E coli (EPEC) NOT DETECTED NOT DETECTED Final   Enterotoxigenic E coli (ETEC) NOT DETECTED NOT DETECTED Final   Shiga like toxin producing E coli (STEC) NOT DETECTED NOT DETECTED Final   Shigella/Enteroinvasive E coli (  EIEC) NOT DETECTED NOT DETECTED Final   Cryptosporidium NOT DETECTED NOT DETECTED Final   Cyclospora cayetanensis NOT DETECTED NOT DETECTED Final   Entamoeba histolytica NOT DETECTED NOT DETECTED Final   Giardia lamblia NOT DETECTED NOT DETECTED Final   Adenovirus F40/41 NOT DETECTED NOT DETECTED Final   Astrovirus NOT DETECTED NOT DETECTED Final   Norovirus GI/GII NOT DETECTED NOT DETECTED Final   Rotavirus A NOT DETECTED NOT DETECTED Final   Sapovirus (I, II, IV, and V) NOT DETECTED NOT DETECTED Final    Comment: Performed at [facility], [address] [place], [place] [address]    Radiology Studies: No results found.  Scheduled Meds:  acetaminophen   1,000 mg Oral TID   amoxicillin -clavulanate  1 tablet Oral Q12H   losartan   50 mg Oral Daily   metoCLOPramide  (REGLAN ) injection  5 mg Intravenous Q8H   metoprolol  tartrate  5 mg Intravenous Q8H   ondansetron   4 mg Oral Q8H   potassium chloride   40 mEq Oral BID   rosuvastatin   10 mg Oral QHS   Continuous Infusions:   LOS: 1 day   Alejandro Marker, DO [facility] Available via Epic secure chat 7am-7pm After these hours, please refer to coverage provider listed on [email or web address] 03/25/2024, 6:58 PM

## 2024-03-25 NOTE — Progress Notes (Signed)
 Daily Progress Note   Patient Name: Caroline Snyder       Date: 03/25/2024 DOB: 04-29-1941  Age: 83 y.o. MRN#: [id number] Attending Physician: Sherrill Alejandro Donovan, DO Primary Care Physician: [name], NP Admit Date: 03/23/2024  Reason for Consultation/Follow-up: Non pain symptom management and Pain control  Subjective: Awake alert, sitting up in bed, complains of pain everywhere, no family at bedside, appears in mild distress.   Length of Stay: 1  Current Medications: Scheduled Meds:   acetaminophen   1,000 mg Oral TID   amoxicillin -clavulanate  1 tablet Oral Q12H   metoCLOPramide  (REGLAN ) injection  5 mg Intravenous Q8H   metoprolol  tartrate  5 mg Intravenous Q8H   ondansetron   4 mg Oral Q8H   pantoprazole  (PROTONIX ) IV  40 mg Intravenous Q24H   potassium chloride   40 mEq Oral BID    Continuous Infusions:   PRN Meds: hydrALAZINE , ondansetron  **OR** ondansetron  (ZOFRAN ) IV, mouth rinse, oxyCODONE , prochlorperazine   Physical Exam         Awake alert Mild distress due to pain Thin and frail Cachectic appearing No edema  Vital Signs: BP (!) 166/76 (BP Location: Right Arm)   Pulse 81   Temp 98.4 F (36.9 C) (Oral)   Resp 18   Ht 5' (1.524 m)   Wt 42.1 kg   SpO2 99%   BMI 18.13 kg/m  SpO2: SpO2: 99 % O2 Device: O2 Device: Room Air O2 Flow Rate:    Intake/output summary:  Intake/Output Summary (Last 24 hours) at 03/25/2024 0944 Last data filed at 03/25/2024 0600 Gross per 24 hour  Intake 611.84 ml  Output 1200 ml  Net -588.16 ml   LBM: Last BM Date : 03/22/24 Baseline Weight: Weight: 45 kg Most recent weight: Weight: 42.1 kg       Palliative Assessment/Data:      Patient Active Problem List   Diagnosis Date Noted   Hypertensive urgency  03/23/2024   Nausea vomiting and diarrhea 03/23/2024   Hypomagnesemia 03/18/2024   Hypophosphatemia 03/18/2024   Hypokalemia 03/18/2024   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 female with medical history significant of abnormal chest CT, acute encephalopathy, altered mental status, history of memory loss, cholangiocarcinoma, external hemorrhoids, GERD, benign neoplasm of colon, depression, hyperlipidemia, hypertensive emergency, migraine headaches, osteoporosis who was discharged from the hospital 03-22-24 after 6-day hospitalization due to acute colitis/ileitis, seen by GI and undergoing an ERCP on 03/19/2024 for biliary stent exchange who returned to the hospital due to nausea, vomiting and diarrhea with development of abdominal pain radiating to her back.   Recommendations/Plan:  Scheduled Tylenol  Add PO Oxy IR PRN and monitor D/C Tramadol  Continue Zofran  ODT scheduled.   Goals of Care and Additional Recommendations: Limitations  on Scope of Treatment: Full Scope Treatment  Code Status:    Code Status Orders  (From admission, onward)           Start     Ordered   03/23/24 1652  Do not attempt resuscitation (DNR)- Limited -Do Not Intubate (DNI)  Continuous       Question Answer Comment  If pulseless and not breathing No CPR or chest compressions.   In Pre-Arrest Conditions (Patient Is Breathing and Has A Pulse) Do not intubate. Provide all appropriate non-invasive medical interventions. Avoid ICU transfer unless indicated or required.   Consent: Discussion documented in EHR or advanced directives reviewed      03/23/24 1653           Code Status History     Date Active Date Inactive Code Status Order ID Comments User Context   03/17/2024 0105 03/22/2024 1759 Limited: Do not attempt resuscitation (DNR) -DNR-LIMITED -Do Not Intubate/DNI  [id number]  Alfornia Madison, MD Inpatient   12/18/2023 1558 12/23/2023 2029 Full Code [id number]  Georgina Basket, MD ED   09/12/2023 2330 09/13/2023 1728 Limited: Do not attempt resuscitation (DNR) -DNR-LIMITED -Do Not Intubate/DNI  [id number]  Lou Claretta CHRISTELLA, MD ED   11/24/2013 1103 11/26/2013 2046 Full Code [id number]  Katrinka Garnette KIDD, MD Inpatient      Advance Directive Documentation    Flowsheet Row Most Recent Value  Type of Advance Directive Healthcare Power of Attorney, Living will  Pre-existing out of facility DNR order (yellow form or pink MOST form) --  MOST Form in Place? --    Prognosis:  Unable to determine  Discharge Planning: To Be Determined  Care plan was discussed with patient.   Thank you for allowing the Palliative Medicine Team to assist in the care of this patient.  Mod MDM     Greater than 50%  of this time was spent counseling and coordinating care related to the above assessment and plan.  Lonia Serve, MD  Please contact Palliative Medicine Team phone at [phone] for questions and concerns.

## 2024-03-25 NOTE — Plan of Care (Signed)

## 2024-03-26 DIAGNOSIS — G893 Neoplasm related pain (acute) (chronic): Secondary | ICD-10-CM

## 2024-03-26 DIAGNOSIS — R109 Unspecified abdominal pain: Secondary | ICD-10-CM | POA: Diagnosis not present

## 2024-03-26 DIAGNOSIS — C240 Malignant neoplasm of extrahepatic bile duct: Secondary | ICD-10-CM | POA: Diagnosis not present

## 2024-03-26 DIAGNOSIS — I160 Hypertensive urgency: Secondary | ICD-10-CM | POA: Diagnosis not present

## 2024-03-26 DIAGNOSIS — E43 Unspecified severe protein-calorie malnutrition: Secondary | ICD-10-CM | POA: Insufficient documentation

## 2024-03-26 LAB — COMPREHENSIVE METABOLIC PANEL WITH GFR
ALT: 24 U/L (ref 0–44)
AST: 27 U/L (ref 15–41)
Albumin: 2.7 g/dL — ABNORMAL LOW (ref 3.5–5.0)
Alkaline Phosphatase: 36 U/L — ABNORMAL LOW (ref 38–126)
Anion gap: 5 (ref 5–15)
BUN: 11 mg/dL (ref 8–23)
CO2: 23 mmol/L (ref 22–32)
Calcium: 8.4 mg/dL — ABNORMAL LOW (ref 8.9–10.3)
Chloride: 109 mmol/L (ref 98–111)
Creatinine, Ser: 0.66 mg/dL (ref 0.44–1.00)
GFR, Estimated: >60 mL/min (ref >60)
Glucose, Bld: 117 mg/dL — ABNORMAL HIGH (ref 70–99)
Potassium: 4 mmol/L (ref 3.5–5.1)
Sodium: 137 mmol/L (ref 135–145)
Total Bilirubin: 0.3 mg/dL (ref 0.0–1.2)
Total Protein: 4.9 g/dL — ABNORMAL LOW (ref 6.5–8.1)

## 2024-03-26 LAB — CBC WITH DIFFERENTIAL/PLATELET
Abs Immature Granulocytes: 0.02 K/uL (ref 0.00–0.07)
Basophils Absolute: 0 K/uL (ref 0.0–0.1)
Basophils Relative: 1 %
Eosinophils Absolute: 0.2 K/uL (ref 0.0–0.5)
Eosinophils Relative: 4 %
HCT: 27.6 % — ABNORMAL LOW (ref 36.0–46.0)
Hemoglobin: 9.2 g/dL — ABNORMAL LOW (ref 12.0–15.0)
Immature Granulocytes: 1 %
Lymphocytes Relative: 4 %
Lymphs Abs: 0.2 K/uL — ABNORMAL LOW (ref 0.7–4.0)
MCH: 32.5 pg (ref 26.0–34.0)
MCHC: 33.3 g/dL (ref 30.0–36.0)
MCV: 97.5 fL (ref 80.0–100.0)
Monocytes Absolute: 0.7 K/uL (ref 0.1–1.0)
Monocytes Relative: 15 %
Neutro Abs: 3.3 K/uL (ref 1.7–7.7)
Neutrophils Relative %: 75 %
Platelets: 135 K/uL — ABNORMAL LOW (ref 150–400)
RBC: 2.83 MIL/uL — ABNORMAL LOW (ref 3.87–5.11)
RDW: 17.4 % — ABNORMAL HIGH (ref 11.5–15.5)
WBC: 4.3 K/uL (ref 4.0–10.5)
nRBC: 0 % (ref 0.0–0.2)

## 2024-03-26 LAB — MAGNESIUM: Magnesium: 1.9 mg/dL (ref 1.7–2.4)

## 2024-03-26 LAB — PHOSPHORUS: Phosphorus: 2.2 mg/dL — ABNORMAL LOW (ref 2.5–4.6)

## 2024-03-26 MED ORDER — HYDROMORPHONE HCL 1 MG/ML IJ SOLN
0.5000 mg | INTRAMUSCULAR | Status: DC | PRN
  Filled 2024-03-26: qty 0.5

## 2024-03-26 MED ORDER — OXYCODONE HCL 5 MG PO TABS
5.0000 mg | ORAL_TABLET | ORAL | Status: DC | PRN
  Administered 2024-03-27 (×2): 5 mg via ORAL
  Filled 2024-03-26 (×2): qty 1

## 2024-03-26 MED ORDER — OXYCODONE HCL 5 MG PO TABS
2.5000 mg | ORAL_TABLET | ORAL | Status: DC | PRN
  Administered 2024-03-26: 2.5 mg via ORAL
  Filled 2024-03-26: qty 1

## 2024-03-26 MED ORDER — K PHOS MONO-SOD PHOS DI & MONO 155-852-130 MG PO TABS
500.0000 mg | ORAL_TABLET | Freq: Four times a day (QID) | ORAL | Status: AC
  Administered 2024-03-26 (×2): 500 mg via ORAL
  Filled 2024-03-26 (×2): qty 2

## 2024-03-26 MED ORDER — SODIUM CHLORIDE 0.9 % IV SOLN: INTRAVENOUS | Status: DC

## 2024-03-26 MED ORDER — MORPHINE SULFATE (PF) 2 MG/ML IV SOLN
1.0000 mg | INTRAVENOUS | Status: DC | PRN
  Administered 2024-03-28: 1 mg via INTRAVENOUS
  Filled 2024-03-26: qty 1

## 2024-03-26 MED ORDER — ENSURE PLUS HIGH PROTEIN PO LIQD
237.0000 mL | Freq: Two times a day (BID) | ORAL | Status: DC
  Administered 2024-03-26 – 2024-03-28 (×4): 237 mL via ORAL

## 2024-03-26 NOTE — Progress Notes (Addendum)
 Caroline Snyder   DOB:11/02/1940   FM#:[id number]      ASSESSMENT & PLAN:  Caroline Snyder is an 83 year old female patient with oncologic history significant for cholangiocarcinoma.  Patient was previously discharged on 03/22/2024 and readmitted on 03/23/2024 with complaints of abdominal and back pain.  Follows with medical oncology/Dr. Natalio Salois.  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  Was on dose reduced capecitabine  twice daily Monday to Friday during radiation. - Patient was seen by surgery, offered surgical option of [place].  Patient and family members opted to avoid surgery. - Medical oncology/Dr. Katilynn Sinkler following closely and will determine further evaluation and treatment recommendations  Abdominal pain Colitis Nausea/vomiting - Patient previously admitted for acute colitis.  Patient reports she still has diarrhea however cannot articulate if she has had a bowel movement today. - She was seen by GI and underwent ERCP with biliary stent exchange - Nausea vomiting improved - Continue supportive care  Vascular dementia - Palliative following - Family concerned about Aide for home upon discharge.  Per social work she has been set up with [facility]. - Continue supportive care  Anemia, normocytic - Likely multifactorial - Hemoglobin 9.2 - No transfusional requirements at this time - Monitor CBC with differential      Code Status DNR-Limited  Subjective:  Patient seen awake and alert laying in bed.  Reports that she has back pain and stomach pain.  Also states she has diarrhea although cannot state if she has had bowel movement today.  Patient reports she does not want to go home yet.  Discussed with patient's niece over the phone.  No other acute distress is noted.  Objective:   Intake/Output Summary (Last 24 hours) at 03/26/2024 1225 Last data filed at 03/25/2024  2000 Gross per 24 hour  Intake 240 ml  Output 1000 ml  Net -760 ml     PHYSICAL EXAMINATION: ECOG PERFORMANCE STATUS: 2 - Symptomatic, <50% confined to bed  Vitals:   03/26/24 0108 03/26/24 0804  BP: (!) 130/56 (!) 174/81  Pulse: 73 88  Resp:    Temp:    SpO2:     Filed Weights   03/23/24 0850 03/23/24 1725  Weight: 99 lb 3.3 oz (45 kg) 92 lb 13 oz (42.1 kg)    GENERAL: alert, no distress and comfortable +thin appearing SKIN: +Pale skin color, texture, turgor are normal, no rashes or significant lesions EYES: normal, conjunctiva are pink and non-injected, sclera clear OROPHARYNX: no exudate, no erythema and lips, buccal mucosa, and tongue normal  NECK: supple, thyroid  normal size, non-tender, without nodularity LYMPH: no palpable lymphadenopathy in the cervical, axillary or inguinal LUNGS: clear to auscultation and percussion with normal breathing effort HEART: regular rate & rhythm and no murmurs and no lower extremity edema ABDOMEN: abdomen soft, non-tender and normal bowel sounds MUSCULOSKELETAL: no cyanosis of digits and no clubbing  PSYCH: alert & oriented +vascular dementia NEURO: no focal motor/sensory deficits   All questions were answered. The patient knows to call the clinic with any problems, questions or concerns.   The total time spent in the appointment was 40 minutes encounter with patient including review of chart and various tests results, discussions about plan of care and coordination of care plan  [name] JINNY Brunner, NP 03/26/2024 12:25 PM    Labs Reviewed:  Lab Results  Component Value Date   WBC 4.3 03/26/2024   HGB 9.2 (L)  03/26/2024   HCT 27.6 (L) 03/26/2024   MCV 97.5 03/26/2024   PLT 135 (L) 03/26/2024   Recent Labs    03/20/24 0640 03/21/24 0533 03/24/24 0842 03/25/24 0656 03/26/24 0711  NA 138   < > 133* 135 137  K 3.5   < > 3.8 3.5 4.0  CL 108   < > 103 103 109  CO2 20*   < > 20* 22 23  GLUCOSE 121*   < > 141* 108* 117*  BUN  <5*   < > 16 11 11   CREATININE 0.59   < > 0.58 0.67 0.66  CALCIUM  8.5*   < > 8.8* 8.2* 8.4*  GFRNONAA >60   < > >60 >60 >60  PROT 5.4*   < > 5.6* 4.9* 4.9*  ALBUMIN 3.1*  3.1*   < > 3.0* 2.8* 2.7*  AST 35   < > 41 35 27  ALT 18   < > 28 27 24   ALKPHOS 36*   < > 40 35* 36*  BILITOT 0.3   < > 0.7 0.4 0.3  BILIDIR <0.1  --   --   --   --   IBILI NOT CALCULATED  --   --   --   --    < > = values in this interval not displayed.    Studies Reviewed:  MR LUMBAR SPINE W WO CONTRAST Result Date: 03/23/2024 CLINICAL DATA:  History of biliary cancer. Question metastatic disease. Back pain. EXAM: MRI LUMBAR SPINE WITHOUT AND WITH CONTRAST TECHNIQUE: Multiplanar and multiecho pulse sequences of the lumbar spine were obtained without and with intravenous contrast. CONTRAST:  5mL GADAVIST  GADOBUTROL  1 MMOL/ML IV SOLN COMPARISON:  Radiography 11/17/2021 FINDINGS: Segmentation:  5 lumbar type vertebral bodies. Alignment: Degenerative anterolisthesis of 4 mm at L3-4 and 6 mm at L4-5. Vertebrae: No fracture or focal bone lesion. No evidence of regional metastatic disease. Edema and enhancement of the facet arthropathy at L3-4 and L4-5. Conus medullaris and cauda equina: Conus extends to the L1 level. Conus and cauda equina appear normal. Paraspinal and other soft tissues: Negative Disc levels: No significant finding from T11-12 through L1-2. L2-3: Mild disc bulge. Moderate bilateral facet degeneration and hypertrophy. Mild canal narrowing but no neural compression. L3-4: Advanced bilateral facet arthropathy with 4 mm of degenerative anterolisthesis. Broad-based disc herniation. Moderate to severe multifactorial stenosis that could cause neural compression on either or both sides. This is slightly worse on the right. L4-5: Advanced bilateral facet arthropathy with anterolisthesis of 6 mm. Shallow protrusion of the disc. Moderate multifactorial stenosis with potential for neural compression in the lateral recesses.  This is more pronounced on the right. Moderate foraminal narrowing on the right as well. L5-S1: No disc abnormality.  Mild facet degeneration.  No stenosis. IMPRESSION: 1. No evidence of metastatic disease to the lumbar spine. 2. L3-4: Advanced bilateral facet arthropathy with 4 mm of anterolisthesis. Broad-based disc herniation. Moderate to severe multifactorial stenosis that could cause neural compression on either or both sides. This is slightly worse on the right. The facet arthritis could certainly be painful. 3. L4-5: Advanced bilateral facet arthropathy with 6 mm of anterolisthesis. Shallow protrusion of the disc. Moderate multifactorial stenosis with potential for neural compression in the lateral recesses, more pronounced on the right. Moderate foraminal narrowing on the right as well. The facet arthritis could be painful. 4. L2-3: Disc bulge. Moderate bilateral facet degeneration and hypertrophy. Mild canal narrowing but no neural compression. 5. L5-S1: Mild  facet degeneration. No stenosis. Electronically Signed   By: Oneil Officer M.D.   On: 03/23/2024 12:54   MR THORACIC SPINE W WO CONTRAST Result Date: 03/23/2024 CLINICAL DATA:  Metastatic disease evaluation. Bile duct cancer. Surgery yesterday. EXAM: MRI THORACIC WITHOUT AND WITH CONTRAST TECHNIQUE: Multiplanar and multiecho pulse sequences of the thoracic spine were obtained without and with intravenous contrast. CONTRAST:  5mL GADAVIST  GADOBUTROL  1 MMOL/ML IV SOLN COMPARISON:  None Available. FINDINGS: Alignment:  No significant malalignment.  Minimal scoliosis. Vertebrae: No fracture or focal bone lesion. No evidence of bony metastatic disease. Cord:  No cord compression or metastatic disease to the cord. Paraspinal and other soft tissues: Tiny pleural effusions layering dependently. Disc levels: No significant disc level finding. No compressive stenosis of the canal or foramina. Facet osteoarthritis at T2-3 but without compressive narrowing of  the canal or foramina. IMPRESSION: 1. No evidence of metastatic disease to the thoracic spine. 2. Tiny pleural effusions layering dependently. Electronically Signed   By: Oneil Officer M.D.   On: 03/23/2024 12:49   CT Angio Abdomen W and/or Wo Contrast Result Date: 03/23/2024 CLINICAL DATA:  Aortic aneurysm suspected. Nausea vomiting and diarrhea. Abdominal pain radiating to back. History of bile duct cancer. * Tracking Code: BO * EXAM: CT ANGIOGRAPHY ABDOMEN TECHNIQUE: Multidetector CT imaging of the abdomen was performed using the standard protocol during bolus administration of intravenous contrast. Multiplanar reconstructed images and MIPs were obtained and reviewed to evaluate the vascular anatomy. RADIATION DOSE REDUCTION: This exam was performed according to the departmental dose-optimization program which includes automated exposure control, adjustment of the mA and/or kV according to patient size and/or use of iterative reconstruction technique. CONTRAST:  90mL OMNIPAQUE  IOHEXOL  350 MG/ML SOLN COMPARISON:  CT scan abdomen and pelvis from 03/16/2024. FINDINGS: VASCULAR Aorta: Normal caliber aorta without aneurysm, dissection, vasculitis or significant stenosis. Celiac: Patent without evidence of aneurysm, dissection, vasculitis or significant stenosis. SMA: Patent without evidence of aneurysm, dissection, vasculitis or significant stenosis. 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 All the renal arteries are patent without evidence of aneurysm, dissection, vasculitis, fibromuscular dysplasia or significant stenosis. IMA: Small caliber. Patent without evidence of aneurysm, dissection, vasculitis or significant stenosis. Inflow: Patent without evidence of aneurysm, dissection, vasculitis or significant stenosis. Veins: No obvious venous abnormality within the limitations of this arterial phase study. Review of the MIP images confirms the above findings. NON-VASCULAR Lower  chest: There are patchy atelectatic changes in the visualized lung bases. No overt consolidation. No pleural effusion. The heart is normal in size. No pericardial effusion. Hepatobiliary: The liver is normal in size. Non-cirrhotic configuration. No suspicious mass. Since the prior study, there is exchange of common bile duct stent. There is new wider stent extending from the common hepatic duct into the second part of duodenum. There is resultant pneumobilia mainly in the left hepatic lobe. No intrahepatic bile duct dilation. Very small volume 2-3 mm calcified gallstones noted in the fundus region without imaging signs of acute cholecystitis. Normal gallbladder wall thickness. No pericholecystic inflammatory changes. Pancreas: Unremarkable. No pancreatic ductal dilatation or surrounding inflammatory changes. Spleen: Within normal limits. No focal lesion. Adrenals/Urinary Tract: Adrenal glands are unremarkable. No suspicious renal mass. No nephroureterolithiasis or obstructive uropathy on either side Stomach/Bowel: No disproportionate dilation of the small or large bowel loops. No evidence of abnormal bowel wall thickening or inflammatory changes. The appendix is unremarkable. Vascular/Lymphatic: No ascites or pneumoperitoneum. No abdominal or pelvic lymphadenopathy, by size criteria. No  aneurysmal dilation of the major abdominal arteries. There are mild peripheral atherosclerotic vascular calcifications of the aorta and its major branches. Other: There is a tiny fat containing umbilical hernia. The soft tissues and abdominal wall are otherwise unremarkable. Musculoskeletal: No suspicious osseous lesions. There are mild multilevel degenerative changes in the visualized spine. IMPRESSION: 1. No acute inflammatory process identified within the abdomen or pelvis. 2. No aortic aneurysm, dissection or periaortic fat stranding. 3. Multiple other nonacute observations, as described above. Aortic Atherosclerosis  (ICD10-I70.0). Electronically Signed   By: Ree Molt M.D.   On: 03/23/2024 10:26   CT ABDOMEN PELVIS W CONTRAST Result Date: 03/16/2024 CLINICAL DATA:  Nausea vomiting diarrhea history of bile duct cancer EXAM: CT ABDOMEN AND PELVIS WITH CONTRAST TECHNIQUE: Multidetector CT imaging of the abdomen and pelvis was performed using the standard protocol following bolus administration of intravenous contrast. RADIATION DOSE REDUCTION: This exam was performed according to the departmental dose-optimization program which includes automated exposure control, adjustment of the mA and/or kV according to patient size and/or use of iterative reconstruction technique. CONTRAST:  OMNIPAQUE  IOHEXOL  300 MG/ML  SOLN COMPARISON:  CT 12/29/2023, MRI 12/18/2023 FINDINGS: Lower chest: Lung bases demonstrate no acute airspace disease. Hepatobiliary: Hepatic steatosis. Pneumobilia. Common bile duct stent remains in place. No biliary distension. Decompressed gallbladder with small stone at the fundus. Indistinct hazy density surrounding the bile duct stent at the porta hepatis. Narrowed appearance of the portal vein at the porta hepatis by indistinct surrounding soft tissue, series 2, image 24, coronal series 8, image 41, and presumably related to the known history of bowel duct malignancy. Pancreas: No ductal dilatation.  No definite inflammatory change. Spleen: Normal in size without focal abnormality. Adrenals/Urinary Tract: Adrenal glands are normal. Kidneys show no hydronephrosis. The bladder is unremarkable Stomach/Bowel: The stomach is decompressed. No dilated small bowel. Areas of wall thickening and pericolonic stranding, involves the cecum, ascending colon, hepatic flexure and transverse colon. Similar changes at the sigmoid colon. Negative appendix. Diverticular disease. Some fluid-filled small bowel in the right lower quadrant, mucosal enhancement of the terminal ileum and pelvic small bowel loops.  Vascular/Lymphatic: Aortic atherosclerosis. No aneurysm. Small porta hepatis lymph nodes measuring up to 8 mm. As stated previously, focal narrowed appearance of the portal vein but without thrombus or occlusion. Reproductive: Status post hysterectomy. No adnexal masses. Other: Negative for pelvic effusion or free air. Musculoskeletal: No acute or suspicious osseous abnormality. IMPRESSION: IMPRESSION 1. Colon wall thickening and pericolonic edema extends from the cecum through the transverse colon and also involving the sigmoid colon consistent with colitis of infectious, inflammatory or ischemic etiology. Fluid-filled small bowel in the right lower quadrant with mild mucosal enhancement of terminal ileum consistent with ileitis. 2. Common bile duct stent remains in place with pneumobilia. No biliary distension. Indistinct hazy soft tissue density at the porta hepatis, surrounding common bile duct stent and now with interval focal narrowed appearance of the portal vein, and presumably elated to the history of known bile duct malignancy. No evidence for portal vein thrombus or occlusion on this exam. 3. Hepatic steatosis. 4. Aortic atherosclerosis. Aortic Atherosclerosis (ICD10-I70.0). Electronically Signed   By: Luke Bun M.D.   On: 03/16/2024 22:01   ADDENDUM:  Patient was personally and independently interviewed, examined and relevant elements of the history of present illness were reviewed in details and an assessment and plan was created. All elements of the patient's history of present illness, assessment and plan were discussed in detail with [name] PARAS  Rouson, NP. The above documentation reflects our combined findings assessment and plan.   Briefly, patient with cholangiocarcinoma of extrahepatic bile duct, treated with concurrent chemoradiation with dose reduced capecitabine , completed radiation treatments on 03/22/2024, with recent biliary stent exchange on 03/19/2024, was admitted to hospital for  recurrence of nausea, vomiting, diarrhea, abdominal pain radiated to the back.  Abdominal pain seems to be multifactorial from malignancy, recent intervention.  Continue current pain management.  Discussed possibility of long-acting pain medicine but this will be deferred considering her age.  Apparently patient was doing well physically when PT did evaluation earlier today but later her performance declined towards the evening.  May need reevaluation by PT.  Palliative care team is on board and we appreciate their assistance, especially in her pain control.  Plan to see her in the clinic for follow-up.  Please call us  with any questions or concerns.

## 2024-03-26 NOTE — Progress Notes (Signed)
 Initial Nutrition Assessment  DOCUMENTATION CODES:   Severe malnutrition in context of chronic illness, Underweight  INTERVENTION:   -Ensure Plus High Protein po BID, each supplement provides 350 kcal and 20 grams of protein.   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  GOAL:   Patient will meet greater than or equal to 90% of their needs  MONITOR:   PO intake, Supplement acceptance  REASON FOR ASSESSMENT:   Consult Assessment of nutrition requirement/status  ASSESSMENT:   83 year old female patient with oncologic history significant for cholangiocarcinoma.  Patient was previously discharged on 03/22/2024 and readmitted on 03/23/2024 with complaints of abdominal and back pain.  Patient in room, seems a bit confused and tired. Had trouble continuing sentences during visit. Pt reports poor appetite. She only eats ~2 meals a day. Breakfast she may have eggs. Her son cooks and brings her dinner most of the time. Some days she may snack all day on chips, a small sandwich. Drinks milk and regular Coke. Drinks 1 Ensure daily, sometimes two but depends. Will order for this admission. Pt denies any nausea today. Was having N/V/D PTA.  For lunch today, drank most of a 2% milk, ate 25% of her meatloaf and 1/2 mac and cheese.  PT undergoing chemo/radiation for cholangiocarcinoma. Last treatment 8/15.  Per weight records, pt has lost 5 lbs since 7/11 (5% wt loss x 1.5 months, insignificant for time).   Medications: Reglan , Zofran  ODT, K-Phos  Labs reviewed: Low Phos (2.2)   NUTRITION - FOCUSED PHYSICAL EXAM:  Flowsheet Row Most Recent Value  Orbital Region Moderate depletion  Upper Arm Region Severe depletion  Thoracic and Lumbar Region Moderate depletion  Buccal Region Moderate depletion   Temple Region Severe depletion  Clavicle Bone Region Severe depletion  Clavicle and Acromion Bone Region Moderate depletion  Scapular Bone Region Moderate depletion  Dorsal Hand Severe depletion  Patellar Region Severe depletion  Anterior Thigh Region Severe depletion  Posterior Calf Region Severe depletion  Edema (RD Assessment) None  Eyes Reviewed  Mouth Reviewed  Skin Reviewed  Nails Reviewed    Diet Order:   Diet Order             DIET SOFT Room service appropriate? Yes; Fluid consistency: Thin  Diet effective now                   EDUCATION NEEDS:   No education needs have been identified at this time  Skin:  Skin Assessment: Skin Integrity Issues: Skin Integrity Issues:: Stage I Stage I: sacrum  Last BM:  8/25  Height:   Ht Readings from Last 1 Encounters:  03/23/24 5' (1.524 m)    Weight:   Wt Readings from Last 1 Encounters:  03/23/24 42.1 kg    BMI:  Body mass index is 18.13 kg/m.  Estimated Nutritional Needs:   Kcal:  1400-1600  Protein:  70-85g  Fluid:  1.6L/day  Morna Lee, MS, RD, LDN Inpatient Clinical Dietitian Contact via Secure chat

## 2024-03-26 NOTE — Progress Notes (Signed)
 Daily Progress Note   Patient Name: Caroline Snyder       Date: 03/26/2024 DOB: 21-May-1941  Age: 83 y.o. MRN#: [id number] Attending Physician: Von Bellis, MD Primary Care Physician: [name], NP Admit Date: 03/23/2024  Reason for Consultation/Follow-up: Non pain symptom management and Pain control  Subjective: Awake alert, ambulating in hallway with PT colleague, no distress.   Length of Stay: 2  Current Medications: Scheduled Meds:   acetaminophen   1,000 mg Oral TID   amoxicillin -clavulanate  1 tablet Oral Q12H   losartan   50 mg Oral Daily   metoCLOPramide  (REGLAN ) injection  5 mg Intravenous Q8H   metoprolol  tartrate  5 mg Intravenous Q8H   ondansetron   4 mg Oral Q8H   phosphorus  500 mg Oral QID   rosuvastatin   10 mg Oral QHS    Continuous Infusions:   PRN Meds: hydrALAZINE , ondansetron  **OR** ondansetron  (ZOFRAN ) IV, mouth rinse, oxyCODONE , pantoprazole , prochlorperazine   Physical Exam         Awake alert   Thin and frail Cachectic appearing No edema  Vital Signs: BP (!) 174/81 (BP Location: Left Arm)   Pulse 88   Temp 98.4 F (36.9 C) (Oral)   Resp 18   Ht 5' (1.524 m)   Wt 42.1 kg   SpO2 98%   BMI 18.13 kg/m  SpO2: SpO2: 98 % O2 Device: O2 Device: Room Air O2 Flow Rate:    Intake/output summary:  Intake/Output Summary (Last 24 hours) at 03/26/2024 1157 Last data filed at 03/25/2024 2000 Gross per 24 hour  Intake 240 ml  Output 1000 ml  Net -760 ml   LBM: Last BM Date : 03/26/24 Baseline Weight: Weight: 45 kg Most recent weight: Weight: 42.1 kg       Palliative Assessment/Data:      Patient Active Problem List   Diagnosis Date Noted   Hypertensive urgency 03/23/2024   Nausea vomiting and diarrhea 03/23/2024   Hypomagnesemia  03/18/2024   Hypophosphatemia 03/18/2024   Hypokalemia 03/18/2024   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 pylori infection 12/29/2023   Abdominal pain 12/29/2023  Gastritis with hemorrhage 12/21/2023   Common bile duct dilatation 12/21/2023   Transaminitis 12/18/2023   Nausea and vomiting 04/18/2019   High risk medication use 06/17/2017   Generalized muscle ache 01/17/2017   Insomnia 08/31/2016   Lumbago 10/21/2015   Paresthesia 05/13/2015   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   Osteoporosis 01/10/2014   Vaginal atrophy 01/10/2014   Cervicalgia 08/21/2013   Depression, recurrent (HCC)    GERD (gastroesophageal reflux disease)    History of colonic polyps 04/27/2010   Essential hypertension 11/22/2007   Hyperlipidemia 06/29/2006    Palliative Care Assessment & Plan   Patient Profile:    Assessment:  [name] is a 83 y.o. female with medical history significant of abnormal chest CT, acute encephalopathy, altered mental status, history of memory loss, cholangiocarcinoma, external hemorrhoids, GERD, benign neoplasm of colon, depression, hyperlipidemia, hypertensive emergency, migraine headaches, osteoporosis who was discharged from the hospital 03-22-24 after 6-day hospitalization due to acute colitis/ileitis, seen by GI and undergoing an ERCP on 03/19/2024 for biliary stent exchange who returned to the hospital due to nausea, vomiting and diarrhea with development of abdominal pain radiating to her back.   Recommendations/Plan:  Scheduled Tylenol  Add PO Oxy IR 2.5 mg Q 4 hours PRN and monitor - 3-4 doses of 2.5 mg each, in the past 24 hours.  D/C Tramadol  Continue Zofran  ODT scheduled. Nausea much improved Recommend outpatient palliative support on  discharge.   Goals of Care and Additional Recommendations: Limitations on Scope of Treatment: Full Scope Treatment  Code Status:    Code Status Orders  (From admission, onward)           Start     Ordered   03/23/24 1652  Do not attempt resuscitation (DNR)- Limited -Do Not Intubate (DNI)  Continuous       Question Answer Comment  If pulseless and not breathing No CPR or chest compressions.   In Pre-Arrest Conditions (Patient Is Breathing and Has A Pulse) Do not intubate. Provide all appropriate non-invasive medical interventions. Avoid ICU transfer unless indicated or required.   Consent: Discussion documented in EHR or advanced directives reviewed      03/23/24 1653           Code Status History     Date Active Date Inactive Code Status Order ID Comments User Context   03/17/2024 0105 03/22/2024 1759 Limited: Do not attempt resuscitation (DNR) -DNR-LIMITED -Do Not Intubate/DNI  [id number]  Alfornia Madison, MD Inpatient   12/18/2023 1558 12/23/2023 2029 Full Code [id number]  Georgina Basket, MD ED   09/12/2023 2330 09/13/2023 1728 Limited: Do not attempt resuscitation (DNR) -DNR-LIMITED -Do Not Intubate/DNI  [id number]  Lou Claretta CHRISTELLA, MD ED   11/24/2013 1103 11/26/2013 2046 Full Code [id number]  Katrinka Garnette KIDD, MD Inpatient      Advance Directive Documentation    Flowsheet Row Most Recent Value  Type of Advance Directive Healthcare Power of Attorney, Living will  Pre-existing out of facility DNR order (yellow form or pink MOST form) --  MOST Form in Place? --    Prognosis:  Unable to determine  Discharge Planning: To Be Determined  Care plan was discussed with IDT  Thank you for allowing the Palliative Medicine Team to assist in the care of this patient.   low MDM     Greater than 50%  of this time was spent counseling and coordinating care related to the above assessment and plan.  Lonia Serve, MD  Please contact Palliative Medicine  Team phone at  [phone] for questions and concerns.

## 2024-03-26 NOTE — Evaluation (Signed)
 Occupational Therapy Evaluation Patient Details Name: Caroline Snyder MRN: [id number] DOB: [birth year] Today's Date: 03/26/2024   History of Present Illness   Pt is a 83 y.o. with PMH of of abnormal chest CT, acute encephalopathy, altered mental status, history of memory loss, cholangiocarcinoma, external hemorrhoids, GERD, benign neoplasm of colon, depression, hyperlipidemia, hypertensive emergency, migraine headaches, osteoporosis who was discharged from the hospital yesterday after 6-day hospitalization due to acute colitis/ileitis, seen by GI and undergoing an ERCP on 03/19/2024 for biliary stent exchange who is returning to the hospital due to nausea, vomiting and diarrhea with development of abdominal pain radiating to her back.     Clinical Impressions Prior to this admission, patient living alone, and still driving. Per patient report, patient has family that checks on her consistently. Currently, patient presenting with cognitive impairments, and minimal decreased activity tolerance. Patient received in bathroom, and unaware of stool on her gown requiring OT intervention. Patient CGA for ADL management, and mod I for mobility. Patient unable to complete the mini-mental cognitive assessment (unable to recall 3 words) and drew a clock and only labeled the clock to 10 instead of 12. Patient was evaluated by this therapist with previous admission and appears to be at her baseline. However, patient would greatly benefit from 24/7 supervision due to cognitive status as she is at risk of polypharmacy, and other safety risks due decreased memory. OT will sign off, please re-consult if acute needs arise.      If plan is discharge home, recommend the following:   Supervision due to cognitive status;Direct supervision/assist for medications management;Direct supervision/assist for financial management;Assistance with cooking/housework;A little help with walking and/or transfers;A little help with  bathing/dressing/bathroom     Functional Status Assessment   Patient has not had a recent decline in their functional status     Equipment Recommendations   None recommended by OT     Recommendations for Other Services         Precautions/Restrictions   Precautions Precautions: None Recall of Precautions/Restrictions: Impaired     Mobility Bed Mobility Overal bed mobility: Modified Independent             General bed mobility comments: up in bathroom upon OT entry    Transfers Overall transfer level: Modified independent Equipment used: None Transfers: Sit to/from Stand Sit to Stand: Modified independent (Device/Increase time)                  Balance Overall balance assessment: No apparent balance deficits (not formally assessed) Sitting-balance support: No upper extremity supported Sitting balance-Leahy Scale: Normal     Standing balance support: No upper extremity supported, During functional activity Standing balance-Leahy Scale: Good                             ADL either performed or assessed with clinical judgement   ADL Overall ADL's : Needs assistance/impaired Eating/Feeding: Sitting;Modified independent   Grooming: Wash/dry hands;Standing;Supervision/safety Grooming Details (indicate cue type and reason): sink level Upper Body Bathing: Set up;Sitting   Lower Body Bathing: Set up;Sitting/lateral leans;Sit to/from stand   Upper Body Dressing : Set up;Sitting   Lower Body Dressing: Supervision/safety;Sit to/from stand   Toilet Transfer: Supervision/safety;Contact guard assist;BSC/3in1   Toileting- Architect and Hygiene: Contact guard assist;Sitting/lateral lean;Sit to/from stand       Functional mobility during ADLs: Contact guard assist;Cueing for safety General ADL Comments: Prior to this admission, patient living alone, and still  driving. Per patient report, patient has family that checks on her  consistently. Currently, patient presenting with cognitive impairments, and minimal decreased activity tolerance. Patient CGA for ADL management, and mod I for mobility. Patient unable to complete the mini-mental cognitive assessment (unable to recall 3 words) and drew a clock and only labeled the clock to 10 instead of 12. Patient was evaluated by this therapist with previous admission and appears to be at her baseline. However, patient would greatly benefit from 24/7 supervision due to cognitive status as she is at risk of polypharmacy, and other safety risks due decreased memory. OT will sign off, please re-consult if acute needs arise.     Vision Baseline Vision/History: 0 No visual deficits Ability to See in Adequate Light: 0 Adequate Patient Visual Report: No change from baseline Vision Assessment?: No apparent visual deficits     Perception Perception: Not tested       Praxis Praxis: Not tested       Pertinent Vitals/Pain Pain Assessment Pain Assessment: No/denies pain     Extremity/Trunk Assessment Upper Extremity Assessment Upper Extremity Assessment: Overall WFL for tasks assessed;Right hand dominant   Lower Extremity Assessment Lower Extremity Assessment: Defer to PT evaluation   Cervical / Trunk Assessment Cervical / Trunk Assessment: Normal   Communication Communication Communication: No apparent difficulties   Cognition Arousal: Alert Behavior During Therapy: WFL for tasks assessed/performed Cognition: Cognition impaired, History of cognitive impairments     Awareness: Intellectual awareness intact, Online awareness intact Memory impairment (select all impairments): Short-term memory, Declarative long-term memory Attention impairment (select first level of impairment): Focused attention Executive functioning impairment (select all impairments): Sequencing, Problem solving, Organization OT - Cognition Comments: Per chart, history of vascular dementia, likes to  joke, difficulty following commands at times                 Following commands: Impaired Following commands impaired: Only follows one step commands consistently, Follows multi-step commands with increased time, Follows multi-step commands inconsistently     Cueing  General Comments   Cueing Techniques: Verbal cues      Exercises     Shoulder Instructions      Home Living Family/patient expects to be discharged to:: Private residence Living Arrangements: Children Available Help at Discharge: Family;Available PRN/intermittently Type of Home: House Home Access: Level entry     Home Layout: One level     Bathroom Shower/Tub: Tub/shower unit;Walk-in shower   Bathroom Toilet: Standard     Home Equipment: None          Prior Functioning/Environment Prior Level of Function : Independent/Modified Independent;Driving             Mobility Comments: independent, driving short distances. supportive family checks in on her frequently      OT Problem List: Decreased activity tolerance;Impaired balance (sitting and/or standing);Decreased cognition   OT Treatment/Interventions: Self-care/ADL training;Energy conservation;Manual therapy;Therapeutic activities;Cognitive remediation/compensation;Patient/family education;Balance training;Therapeutic exercise      OT Goals(Current goals can be found in the care plan section)   Acute Rehab OT Goals Patient Stated Goal: to get better OT Goal Formulation: With patient Time For Goal Achievement: 04/09/24 Potential to Achieve Goals: Good   OT Frequency:  Min 3X/week    Co-evaluation              AM-PAC OT 6 Clicks Daily Activity     Outcome Measure Help from another person eating meals?: None Help from another person taking care of personal grooming?: None Help from  another person toileting, which includes using toliet, bedpan, or urinal?: A Little Help from another person bathing (including washing,  rinsing, drying)?: None Help from another person to put on and taking off regular upper body clothing?: None Help from another person to put on and taking off regular lower body clothing?: None 6 Click Score: 23   End of Session Nurse Communication: Mobility status  Activity Tolerance: Patient tolerated treatment well Patient left: in bed;with call bell/phone within reach;with bed alarm set  OT Visit Diagnosis: Muscle weakness (generalized) (M62.81);Other symptoms and signs involving cognitive function                Time: 8893-8883 OT Time Calculation (min): 10 min Charges:  OT General Charges $OT Visit: 1 Visit OT Evaluation $OT Eval Moderate Complexity: 1 Mod  Ronal Gift E. Aven Christen, OTR/L Acute Rehabilitation Services [phone]   Ronal Gift Salt 03/26/2024, 1:23 PM

## 2024-03-26 NOTE — Plan of Care (Signed)

## 2024-03-26 NOTE — Progress Notes (Signed)
 PROGRESS NOTE    Caroline Snyder  FMW:[id number] DOB: 1941/01/14 DOA: 03/23/2024 PCP: [name], NP   Brief Narrative:  Caroline Snyder is a 83 y.o. female with medical history significant of abnormal chest CT, acute encephalopathy, altered mental status, history of memory loss, cholangiocarcinoma, external hemorrhoids, GERD, benign neoplasm of colon, depression, hyperlipidemia, hypertensive emergency, migraine headaches, osteoporosis who was discharged from the hospital yesterday after 6-day hospitalization due to acute colitis/ileitis, seen by GI and undergoing an ERCP on 03/19/2024 for biliary stent exchange who is returning to the hospital due to nausea, vomiting and diarrhea with development of abdominal pain radiating to her back.   Abdominal pain and nausea has been improved and her diet has been and went from clears to full.  Tolerated full's and went to soft and per son she is tolerating soft his morning.  Palliative is adjusting further pain regimen  Assessment and Plan:   # Nausea, vomiting and diarrhea Associated with Abdominal pain In the setting of chemo and Treatment for Primary cholangiocarcinoma of extrahepatic bile duct (HCC):  8/25 restarted IV fluid restarted NS 75 mL/h for overnight hydration, as patient has diarrhea.  Continue symptomatic treatment Oxycodone  IR 2.5 mg p.o. every 4 hourly Oncology and palliative care following Titrate medications accordingly  Essential Hypertension Presenting with Hypertensive Urgency: Observation/telemetry changed to Inpt. Optimize pain control. Resume home losartan  but per [facility] she is not longer taking it.  Will resume at half the dose at 50 mg daily. CTM BP per Protocol.    # Acute Colitis/Ileitis: Resumed Abx as above; see pain control as above  # Hypophosphatemia due to nutritional deficiency.  Phos repleted.   # Hypomagnesemia, mag repleted. # Mild hyponatremia, resolved. # Mild metabolic acidosis, resolved. #  Hypocalcemia due to hypoalbuminemia.   Monitor electrolytes daily   Hyperlipidemia: Resumed Rosuvastatin  10 mg p.o. daily.   Depression, Recurrent (HCC) /  Vascular Dementia (HCC): Supportive care. Avoid benzodiazepines and will hold lorazepam  0.5 mg tablets that she takes every 6 as needed. Palliative Consulted and will continue delirium precautions  Normocytic Anemia: Secondary to hemodilution and chemotherapy Hgb/Hct Trend:  Recent Labs  Lab 03/19/24 0815 03/20/24 0640 03/21/24 0533 03/22/24 9386 03/23/24 0902 03/24/24 0842 03/25/24 0656  HGB 9.4* 9.6* 9.2* 9.8* 12.1 10.7* 9.3*  HCT 28.7* 28.0* 27.5* 29.4* 36.1 33.4* 29.4*  MCV 93.5 92.4 93.9 94.2 95.5 97.7 99.0  -Recent Anemia Panel done showed an iron level 45, TIBC 237, ferritin level of 86, folate level of 16.8 and a vitamin B12 of 328. CTM for S/Sx of Bleeding; No overt bleeding noted. Repeat CBC in the AM   GERD (gastroesophageal reflux disease)/GI Prophylaxis: IV Pantoprazole  40 mg daily is going to be changed to p.o. 40 mg po Daily   Hypoalbuminemia: Patient's Albumin Trend: Recent Labs  Lab 03/18/24 0642 03/19/24 0815 03/20/24 0640 03/22/24 9386 03/23/24 0902 03/24/24 0842 03/25/24 0656  ALBUMIN 2.5* 3.2* 3.1*  3.1* 2.9* 3.3* 3.0* 2.8*  -CTM and Trend and repeat CMP in the AM  Underweight / Severe Protein Calorie Malnutrition: Complicates overall prognosis and care. Estimated body mass index is 18.13 kg/m as calculated from the following:   Height as of this encounter: 5' (1.524 m).   Weight as of this encounter: 42.1 kg. Nutritionist Consulted and awaiting evaluation    DVT prophylaxis: SCDs Start: 03/23/24 1652    Code Status: Limited: Do not attempt resuscitation (DNR) -DNR-LIMITED -Do Not Intubate/DNI  Family Communication: D/w Son @ bedside  Disposition Plan:  Level of care: Telemetry Status is: Inpatient Remains inpatient appropriate because: Needs further clinical improvement in her status and  tolerance of diet as well as evaluation by PT and OT   Consultants:  Palliative Care Notified Oncology via Epic  Procedures:  As delineated as above  Antimicrobials:  Anti-infectives (From admission, onward)    Start     Dose/Rate Route Frequency Ordered Stop   03/24/24 1345  amoxicillin -clavulanate (AUGMENTIN ) 875-125 MG per tablet 1 tablet        1 tablet Oral Every 12 hours 03/24/24 1248         Subjective: Patient was seen and examined at bedside in the morning time. Patient had abdominal pain earlier in the morning which resolved, it was 5/10, no pain at this time.  Denies any nausea vomiting. Discussed with the family at bedside and her son over the phone.  Patient wanted to go home initially and then she changed her mind and does not wanted to go home.   Objective: Vitals:   03/26/24 0000 03/26/24 0108 03/26/24 0804 03/26/24 1303  BP: (!) 160/69 (!) 130/56 (!) 174/81 (!) 155/63  Pulse:  73 88 83  Resp:    20  Temp:    97.6 F (36.4 C)  TempSrc:    Oral  SpO2:    100%  Weight:      Height:        Intake/Output Summary (Last 24 hours) at 03/26/2024 1509 Last data filed at 03/26/2024 1300 Gross per 24 hour  Intake 480 ml  Output 1000 ml  Net -520 ml   Filed Weights   03/23/24 0850 03/23/24 1725  Weight: 45 kg 42.1 kg   Examination: Physical Exam:  Constitutional: Thin chronically ill-appearing Caucasian elderly female who appears more comfortable and more awake today compared to yesterday and not in acute distress Respiratory: Diminished to auscultation bilaterally with some coarse breath sounds, no wheezing, rales, rhonchi or crackles. Normal respiratory effort and patient is not tachypenic. No accessory muscle use.  Unlabored breathing Cardiovascular: RRR, no murmurs / rubs / gallops. S1 and S2 auscultated. No extremity edema. Abdomen: Soft, non-tender, non-distended. Bowel sounds positive.  GU: Deferred. Musculoskeletal: No clubbing / cyanosis of  digits/nails. No joint deformity upper and lower extremities. Skin: No rashes, lesions, ulcers on limited skin evaluation. No induration; Warm and dry.  Neurologic: CN 2-12 grossly intact with no focal deficits. Romberg sign and cerebellar reflexes not assessed.  Psychiatric: Alert and oriented x 3.  Appears calm  Data Reviewed: I have personally reviewed following labs and imaging studies  CBC: Recent Labs  Lab 03/20/24 0640 03/21/24 0533 03/22/24 9386 03/23/24 0902 03/24/24 0842 03/25/24 0656 03/26/24 0711  WBC 5.4   < > 4.2 8.1 7.4 4.6 4.3  NEUTROABS 4.4  --   --  7.3  --  3.8 3.3  HGB 9.6*   < > 9.8* 12.1 10.7* 9.3* 9.2*  HCT 28.0*   < > 29.4* 36.1 33.4* 29.4* 27.6*  MCV 92.4   < > 94.2 95.5 97.7 99.0 97.5  PLT 158   < > 162 196 188 166 135*   < > = values in this interval not displayed.   Basic Metabolic Panel: Recent Labs  Lab 03/20/24 0640 03/21/24 0533 03/22/24 9386 03/23/24 0902 03/24/24 0842 03/25/24 0656 03/26/24 0711  NA 138   < > 136 139 133* 135 137  K 3.5   < > 3.7 3.8 3.8 3.5 4.0  CL  108   < > 105 102 103 103 109  CO2 20*   < > 25 22 20* 22 23  GLUCOSE 121*   < > 121* 181* 141* 108* 117*  BUN <5*   < > 8 13 16 11 11   CREATININE 0.59   < > 0.65 0.64 0.58 0.67 0.66  CALCIUM  8.5*   < > 8.7* 9.1 8.8* 8.2* 8.4*  MG 1.8  --   --   --  1.5* 2.2 1.9  PHOS 2.3*  --   --   --  2.1* 2.5 2.2*   < > = values in this interval not displayed.   GFR: Estimated Creatinine Clearance: 34.8 mL/min (by C-G formula based on SCr of 0.66 mg/dL). Liver Function Tests: Recent Labs  Lab 03/22/24 9386 03/23/24 0902 03/24/24 0842 03/25/24 0656 03/26/24 0711  AST 35 45* 41 35 27  ALT 26 29 28 27 24   ALKPHOS 34* 45 40 35* 36*  BILITOT 0.5 1.0 0.7 0.4 0.3  PROT 5.1* 6.2* 5.6* 4.9* 4.9*  ALBUMIN 2.9* 3.3* 3.0* 2.8* 2.7*   Recent Labs  Lab 03/23/24 0902  LIPASE 24   No results for input(s): AMMONIA in the last 168 hours. Coagulation Profile: No results for  input(s): INR, PROTIME in the last 168 hours. Cardiac Enzymes: No results for input(s): CKTOTAL, CKMB, CKMBINDEX, TROPONINI in the last 168 hours. BNP (last 3 results) No results for input(s): PROBNP in the last 8760 hours. HbA1C: No results for input(s): HGBA1C in the last 72 hours. CBG: No results for input(s): GLUCAP in the last 168 hours. Lipid Profile: No results for input(s): CHOL, HDL, LDLCALC, TRIG, CHOLHDL, LDLDIRECT in the last 72 hours. Thyroid  Function Tests: No results for input(s): TSH, T4TOTAL, FREET4, T3FREE, THYROIDAB in the last 72 hours. Anemia Panel: No results for input(s): VITAMINB12, FOLATE, FERRITIN, TIBC, IRON, RETICCTPCT in the last 72 hours. Sepsis Labs: No results for input(s): PROCALCITON, LATICACIDVEN in the last 168 hours.  Recent Results (from the past 240 hours)  C Difficile Quick Screen w PCR reflex     Status: None   Collection Time: 03/17/24 12:37 AM   Specimen: Stool  Result Value Ref Range Status   C Diff antigen NEGATIVE NEGATIVE Final   C Diff toxin NEGATIVE NEGATIVE Final   C Diff interpretation No C. difficile detected.  Final    Comment: Performed at [facility], [address], [place], [place] [address]  Gastrointestinal Panel by PCR , Stool     Status: None   Collection Time: 03/18/24 12:37 AM   Specimen: Stool  Result Value Ref Range Status   Campylobacter species NOT DETECTED NOT DETECTED Final   Plesimonas shigelloides NOT DETECTED NOT DETECTED Final   Salmonella species NOT DETECTED NOT DETECTED Final   Yersinia enterocolitica NOT DETECTED NOT DETECTED Final   Vibrio species NOT DETECTED NOT DETECTED Final   Vibrio cholerae NOT DETECTED NOT DETECTED Final   Enteroaggregative E coli (EAEC) NOT DETECTED NOT DETECTED Final   Enteropathogenic E coli (EPEC) NOT DETECTED NOT DETECTED Final   Enterotoxigenic E coli (ETEC) NOT DETECTED NOT DETECTED Final    Shiga like toxin producing E coli (STEC) NOT DETECTED NOT DETECTED Final   Shigella/Enteroinvasive E coli (EIEC) NOT DETECTED NOT DETECTED Final   Cryptosporidium NOT DETECTED NOT DETECTED Final   Cyclospora cayetanensis NOT DETECTED NOT DETECTED Final   Entamoeba histolytica NOT DETECTED NOT DETECTED Final   Giardia lamblia NOT DETECTED NOT DETECTED Final  Adenovirus F40/41 NOT DETECTED NOT DETECTED Final   Astrovirus NOT DETECTED NOT DETECTED Final   Norovirus GI/GII NOT DETECTED NOT DETECTED Final   Rotavirus A NOT DETECTED NOT DETECTED Final   Sapovirus (I, II, IV, and V) NOT DETECTED NOT DETECTED Final    Comment: Performed at [facility], [address], [place], [place] [address]    Radiology Studies: No results found.  Scheduled Meds:  acetaminophen   1,000 mg Oral TID   amoxicillin -clavulanate  1 tablet Oral Q12H   feeding supplement  237 mL Oral BID BM   losartan   50 mg Oral Daily   metoCLOPramide  (REGLAN ) injection  5 mg Intravenous Q8H   metoprolol  tartrate  5 mg Intravenous Q8H   rosuvastatin   10 mg Oral QHS   Continuous Infusions:   LOS: 2 days   Elvan Sor, MD [facility] Available via Epic secure chat 7am-7pm After these hours, please refer to coverage provider listed on [email or web address] 03/26/2024, 3:09 PM

## 2024-03-26 NOTE — Evaluation (Signed)
 Physical Therapy Evaluation Patient Details Name: TEMPRANCE WYRE MRN: [id number] DOB: 12/24/1940 Today's Date: 03/26/2024  History of Present Illness  Pt is a 83 y.o. with PMH of of abnormal chest CT, acute encephalopathy, altered mental status, history of memory loss, cholangiocarcinoma, external hemorrhoids, GERD, benign neoplasm of colon, depression, hyperlipidemia, hypertensive emergency, migraine headaches, osteoporosis who was discharged from the hospital yesterday after 6-day hospitalization due to acute colitis/ileitis, seen by GI and undergoing an ERCP on 03/19/2024 for biliary stent exchange who is returning to the hospital due to nausea, vomiting and diarrhea with development of abdominal pain radiating to her back.  Clinical Impression  Pt presents with a history of decreased cognition which is contributing to her decreased mobility in an unfamiliar environment. Pt's strength is WNL and balance while ambulating in the hall is good. Pt ambulated 300 ft with supervision and min verbal cues for path finding tasks. Pt has a supportive family who checks in on her frequently. I would recommend supervision due to her cognitive status not based on her strength or mobility. Pt reports she drives and does some minor cooking at home. I do feel she is at her baseline and do not feel acute PT is indicated. Recommend mobility team to assist with mobility on the unit until d/c home.       If plan is discharge home, recommend the following: Supervision due to cognitive status;Assistance with cooking/housework;Direct supervision/assist for medications management;Assist for transportation   Can travel by private vehicle        Equipment Recommendations None recommended by PT  Recommendations for Other Services       Functional Status Assessment Patient has not had a recent decline in their functional status     Precautions / Restrictions Precautions Precautions: Other (comment) (decreased  memory,) Restrictions Weight Bearing Restrictions Per Provider Order: No      Mobility  Bed Mobility Overal bed mobility: Modified Independent Bed Mobility: Supine to Sit     Supine to sit: Modified independent (Device/Increase time)          Transfers Overall transfer level: Modified independent Equipment used: None Transfers: Sit to/from Stand Sit to Stand: Modified independent (Device/Increase time)                Ambulation/Gait Ambulation/Gait assistance: Contact guard assist Gait Distance (Feet): 300 Feet Assistive device: None Gait Pattern/deviations: Decreased stride length Gait velocity: decreased     General Gait Details: no LOB with mild challanges to balance, pt requires supervision due to unfamiliar environment with gait, focused on path finding tasks.  Stairs            Wheelchair Mobility     Tilt Bed    Modified Rankin (Stroke Patients Only)       Balance Overall balance assessment: No apparent balance deficits (not formally assessed) Sitting-balance support: No upper extremity supported Sitting balance-Leahy Scale: Normal     Standing balance support: No upper extremity supported, During functional activity Standing balance-Leahy Scale: Good                               Pertinent Vitals/Pain Pain Assessment Pain Assessment: No/denies pain    Home Living Family/patient expects to be discharged to:: Private residence Living Arrangements: Children Available Help at Discharge: Family;Available PRN/intermittently Type of Home: House Home Access: Level entry       Home Layout: One level Home Equipment: None  Prior Function Prior Level of Function : Independent/Modified Independent;Driving             Mobility Comments: independent, driving short distances. supportive family checks in on her frequently       Extremity/Trunk Assessment   Upper Extremity Assessment Upper Extremity Assessment:  Defer to OT evaluation    Lower Extremity Assessment Lower Extremity Assessment: Overall WFL for tasks assessed    Cervical / Trunk Assessment Cervical / Trunk Assessment: Normal  Communication   Communication Communication: No apparent difficulties    Cognition Arousal: Alert Behavior During Therapy: WFL for tasks assessed/performed   PT - Cognitive impairments: History of cognitive impairments                       PT - Cognition Comments: dec. memory, judgement, path finding         Cueing Cueing Techniques: Verbal cues     General Comments      Exercises     Assessment/Plan    PT Assessment Patient does not need any further PT services  PT Problem List Decreased cognition;Decreased mobility       PT Treatment Interventions      PT Goals (Current goals can be found in the Care Plan section)       Frequency       Co-evaluation               AM-PAC PT 6 Clicks Mobility  Outcome Measure Help needed turning from your back to your side while in a flat bed without using bedrails?: None Help needed moving from lying on your back to sitting on the side of a flat bed without using bedrails?: None Help needed moving to and from a bed to a chair (including a wheelchair)?: None Help needed standing up from a chair using your arms (e.g., wheelchair or bedside chair)?: None Help needed to walk in hospital room?: A Little (due to dec. cogntion) Help needed climbing 3-5 steps with a railing? : A Little 6 Click Score: 22    End of Session   Activity Tolerance: Patient tolerated treatment well Patient left: in bed;with call bell/phone within reach Nurse Communication: Mobility status PT Visit Diagnosis: Unsteadiness on feet (R26.81)    Time: 8957-8898 PT Time Calculation (min) (ACUTE ONLY): 19 min   Charges:   PT Evaluation $PT Eval Low Complexity: 1 Low   PT General Charges $$ ACUTE PT VISIT: 1 Visit       Ike Killings,  PT  Mehkai Gallo Kerstine 03/26/2024, 11:04 AM

## 2024-03-26 NOTE — Progress Notes (Signed)
 DR. Von called to speak to family, they have many questions about the patient's pain and why are pain medicines being changed.

## 2024-03-27 ENCOUNTER — Encounter: Payer: Self-pay | Admitting: Oncology

## 2024-03-27 DIAGNOSIS — I160 Hypertensive urgency: Secondary | ICD-10-CM | POA: Diagnosis not present

## 2024-03-27 LAB — CBC
HCT: 27.9 % — ABNORMAL LOW (ref 36.0–46.0)
Hemoglobin: 9 g/dL — ABNORMAL LOW (ref 12.0–15.0)
MCH: 31.8 pg (ref 26.0–34.0)
MCHC: 32.3 g/dL (ref 30.0–36.0)
MCV: 98.6 fL (ref 80.0–100.0)
Platelets: 137 K/uL — ABNORMAL LOW (ref 150–400)
RBC: 2.83 MIL/uL — ABNORMAL LOW (ref 3.87–5.11)
RDW: 17.7 % — ABNORMAL HIGH (ref 11.5–15.5)
WBC: 3.8 K/uL — ABNORMAL LOW (ref 4.0–10.5)
nRBC: 0 % (ref 0.0–0.2)

## 2024-03-27 LAB — HEPATIC FUNCTION PANEL
ALT: 20 U/L (ref 0–44)
AST: 20 U/L (ref 15–41)
Albumin: 2.8 g/dL — ABNORMAL LOW (ref 3.5–5.0)
Alkaline Phosphatase: 35 U/L — ABNORMAL LOW (ref 38–126)
Bilirubin, Direct: <0.1 mg/dL (ref 0.0–0.2)
Total Bilirubin: 0.5 mg/dL (ref 0.0–1.2)
Total Protein: 5 g/dL — ABNORMAL LOW (ref 6.5–8.1)

## 2024-03-27 LAB — BASIC METABOLIC PANEL WITH GFR
Anion gap: 9 (ref 5–15)
BUN: 12 mg/dL (ref 8–23)
CO2: 23 mmol/L (ref 22–32)
Calcium: 8.4 mg/dL — ABNORMAL LOW (ref 8.9–10.3)
Chloride: 107 mmol/L (ref 98–111)
Creatinine, Ser: 0.63 mg/dL (ref 0.44–1.00)
GFR, Estimated: >60 mL/min (ref >60)
Glucose, Bld: 116 mg/dL — ABNORMAL HIGH (ref 70–99)
Potassium: 3.5 mmol/L (ref 3.5–5.1)
Sodium: 139 mmol/L (ref 135–145)

## 2024-03-27 LAB — MAGNESIUM: Magnesium: 1.7 mg/dL (ref 1.7–2.4)

## 2024-03-27 LAB — PHOSPHORUS: Phosphorus: 3.5 mg/dL (ref 2.5–4.6)

## 2024-03-27 MED ORDER — MELATONIN 5 MG PO TABS
5.0000 mg | ORAL_TABLET | ORAL | Status: DC
  Administered 2024-03-27: 5 mg via ORAL
  Filled 2024-03-27: qty 1

## 2024-03-27 MED ORDER — POTASSIUM CHLORIDE 20 MEQ PO PACK
40.0000 meq | PACK | Freq: Once | ORAL | Status: AC
  Administered 2024-03-27: 40 meq via ORAL
  Filled 2024-03-27: qty 2

## 2024-03-27 MED ORDER — LOPERAMIDE HCL 2 MG PO CAPS
2.0000 mg | ORAL_CAPSULE | Freq: Once | ORAL | Status: AC
  Administered 2024-03-27: 2 mg via ORAL
  Filled 2024-03-27: qty 1

## 2024-03-27 MED ORDER — PANTOPRAZOLE SODIUM 40 MG PO TBEC
40.0000 mg | DELAYED_RELEASE_TABLET | Freq: Every day | ORAL | Status: DC
  Administered 2024-03-27 – 2024-03-28 (×2): 40 mg via ORAL
  Filled 2024-03-27 (×2): qty 1

## 2024-03-27 MED ORDER — SACCHAROMYCES BOULARDII 250 MG PO CAPS
250.0000 mg | ORAL_CAPSULE | Freq: Two times a day (BID) | ORAL | Status: DC
  Administered 2024-03-27 – 2024-03-28 (×3): 250 mg via ORAL
  Filled 2024-03-27 (×3): qty 1

## 2024-03-27 MED ORDER — SODIUM CHLORIDE 0.9 % IV SOLN: INTRAVENOUS | Status: AC

## 2024-03-27 NOTE — Progress Notes (Signed)
 PROGRESS NOTE    Caroline Snyder  FMW:[id number] DOB: 1941-05-30 DOA: 03/23/2024 PCP: [name], NP   Brief Narrative:  Caroline Snyder is a 83 y.o. female with medical history significant of abnormal chest CT, acute encephalopathy, altered mental status, history of memory loss, cholangiocarcinoma, external hemorrhoids, GERD, benign neoplasm of colon, depression, hyperlipidemia, hypertensive emergency, migraine headaches, osteoporosis who was discharged from the hospital yesterday after 6-day hospitalization due to acute colitis/ileitis, seen by GI and undergoing an ERCP on 03/19/2024 for biliary stent exchange who is returning to the hospital due to nausea, vomiting and diarrhea with development of abdominal pain radiating to her back.   Abdominal pain and nausea has been improved and her diet has been and went from clears to full.  Tolerated full's and went to soft and per son she is tolerating soft his morning.  Palliative is adjusting further pain regimen  Assessment and Plan:   # Nausea, vomiting and diarrhea Associated with Abdominal pain In the setting of chemo and Treatment for Primary cholangiocarcinoma of extrahepatic bile duct (HCC):  8/25 restarted IV fluid restarted NS 75 mL/h for overnight hydration, as patient has diarrhea.  Continue symptomatic treatment Continue Tylenol  1000 mg p.o. 3 times daily scheduled 8/25 increased oxycodone  IR 5 mg p.o. every 4 hourly Morphine  IV as needed for breakthrough pain Oncology and palliative care following Titrate medications accordingly 8/26 continue IV fluid for hydration and repleted potassium.  Recheck electrolytes tomorrow a.m. and then DC plan if stable  Essential Hypertension Presenting with Hypertensive Urgency: Observation/telemetry changed to Inpt. Optimize pain control. Resume home losartan  but per [facility] she is not longer taking it.  Will resume at half the dose at 50 mg daily. CTM BP per Protocol.    # Acute  Colitis/Ileitis: s/p Augmentin  twice daily.  Discontinued on 8/26  Patient was given antibiotics during last admission and needed to complete 2 more days.,  No more need of antibiotics. 8/26 still having diarrhea 1-2 episodes daily.  Started probiotics, continue for 5 days   # Hypophosphatemia due to nutritional deficiency.  Phos repleted.   # Hypomagnesemia, mag repleted. # Mild hyponatremia, resolved. # Mild metabolic acidosis, resolved. # Hypocalcemia due to hypoalbuminemia.   Monitor electrolytes daily   Hyperlipidemia: Resumed Rosuvastatin  10 mg p.o. daily.   Depression, Recurrent (HCC) /  Vascular Dementia (HCC): Supportive care. Avoid benzodiazepines and will hold lorazepam  0.5 mg tablets that she takes every 6 as needed. Palliative Consulted and will continue delirium precautions  Normocytic Anemia: Secondary to hemodilution and chemotherapy Hgb/Hct Trend:  Recent Labs  Lab 03/19/24 0815 03/20/24 0640 03/21/24 0533 03/22/24 9386 03/23/24 0902 03/24/24 0842 03/25/24 0656  HGB 9.4* 9.6* 9.2* 9.8* 12.1 10.7* 9.3*  HCT 28.7* 28.0* 27.5* 29.4* 36.1 33.4* 29.4*  MCV 93.5 92.4 93.9 94.2 95.5 97.7 99.0  -Recent Anemia Panel done showed an iron level 45, TIBC 237, ferritin level of 86, folate level of 16.8 and a vitamin B12 of 328. CTM for S/Sx of Bleeding; No overt bleeding noted. Repeat CBC in the AM   GERD (gastroesophageal reflux disease)/GI Prophylaxis: IV Pantoprazole  40 mg daily is going to be changed to p.o. 40 mg po Daily   Hypoalbuminemia: Patient's Albumin Trend: Recent Labs  Lab 03/18/24 0642 03/19/24 0815 03/20/24 0640 03/22/24 9386 03/23/24 0902 03/24/24 0842 03/25/24 0656  ALBUMIN 2.5* 3.2* 3.1*  3.1* 2.9* 3.3* 3.0* 2.8*  -CTM and Trend and repeat CMP in the AM  Underweight / Severe Protein Calorie  Malnutrition: Complicates overall prognosis and care. Estimated body mass index is 18.13 kg/m as calculated from the following:   Height as of this  encounter: 5' (1.524 m).   Weight as of this encounter: 42.1 kg. Nutritionist Consulted and awaiting evaluation    DVT prophylaxis: SCDs Start: 03/23/24 1652    Code Status: Limited: Do not attempt resuscitation (DNR) -DNR-LIMITED -Do Not Intubate/DNI  Family Communication: D/w Son @ bedside  Disposition Plan:  Level of care: Telemetry Status is: Inpatient Remains inpatient appropriate because: Needs further clinical improvement in her status and tolerance of diet.  PT and OT eval done. As per [facility] home health has been arranged. 8/26 discussed with patient's son over the phone, plan is to discharge her tomorrow a.m. if remains stable.   Consultants:  Palliative Care Oncology Dr Autumn  Procedures:  As delineated as above  Antimicrobials:  Anti-infectives (From admission, onward)    Start     Dose/Rate Route Frequency Ordered Stop   03/24/24 1345  amoxicillin -clavulanate (AUGMENTIN ) 875-125 MG per tablet 1 tablet  Status:  Discontinued        1 tablet Oral Every 12 hours 03/24/24 1248 03/27/24 1143       Subjective: Patient was seen and examined at bedside in the morning time. Patient had 1 episode of vomiting in the morning and 1 episode of diarrhea.  As per patient she had 2 episode of diarrhea yesterday.  Denied any abdominal pain at this time.  As per patient she did not sleep well last night.    Objective: Vitals:   03/26/24 1303 03/26/24 2205 03/27/24 0605 03/27/24 1245  BP: (!) 155/63 (!) 149/65 132/63 (!) 150/55  Pulse: 83 74 76 81  Resp: 20 18 18 20   Temp: 97.6 F (36.4 C) 98.5 F (36.9 C) 98.3 F (36.8 C) 98.6 F (37 C)  TempSrc: Oral Oral Oral Oral  SpO2: 100% 100% 98% 99%  Weight:      Height:        Intake/Output Summary (Last 24 hours) at 03/27/2024 1414 Last data filed at 03/27/2024 0504 Gross per 24 hour  Intake 796.05 ml  Output --  Net 796.05 ml   Filed Weights   03/23/24 0850 03/23/24 1725  Weight: 45 kg 42.1 kg   Examination: Physical  Exam:  Constitutional: Thin chronically ill-appearing Caucasian elderly female who appears more comfortable and more awake today compared to yesterday and not in acute distress Respiratory: Diminished to auscultation bilaterally with some coarse breath sounds, no wheezing, rales, rhonchi or crackles. Normal respiratory effort and patient is not tachypenic. No accessory muscle use.  Unlabored breathing Cardiovascular: RRR, no murmurs / rubs / gallops. S1 and S2 auscultated. No extremity edema. Abdomen: Soft, non-tender, non-distended. Bowel sounds positive.  GU: Deferred. Musculoskeletal: No clubbing / cyanosis of digits/nails. No joint deformity upper and lower extremities. Skin: No rashes, lesions, ulcers on limited skin evaluation. No induration; Warm and dry.  Neurologic: CN 2-12 grossly intact with no focal deficits. Romberg sign and cerebellar reflexes not assessed.  Psychiatric: Alert and oriented x 3.  Appears calm  Data Reviewed: I have personally reviewed following labs and imaging studies  CBC: Recent Labs  Lab 03/23/24 0902 03/24/24 0842 03/25/24 0656 03/26/24 0711 03/27/24 0556  WBC 8.1 7.4 4.6 4.3 3.8*  NEUTROABS 7.3  --  3.8 3.3  --   HGB 12.1 10.7* 9.3* 9.2* 9.0*  HCT 36.1 33.4* 29.4* 27.6* 27.9*  MCV 95.5 97.7 99.0 97.5 98.6  PLT  196 188 166 135* 137*   Basic Metabolic Panel: Recent Labs  Lab 03/23/24 0902 03/24/24 0842 03/25/24 0656 03/26/24 0711 03/27/24 0556  NA 139 133* 135 137 139  K 3.8 3.8 3.5 4.0 3.5  CL 102 103 103 109 107  CO2 22 20* 22 23 23   GLUCOSE 181* 141* 108* 117* 116*  BUN 13 16 11 11 12   CREATININE 0.64 0.58 0.67 0.66 0.63  CALCIUM  9.1 8.8* 8.2* 8.4* 8.4*  MG  --  1.5* 2.2 1.9 1.7  PHOS  --  2.1* 2.5 2.2* 3.5   GFR: Estimated Creatinine Clearance: 34.8 mL/min (by C-G formula based on SCr of 0.63 mg/dL). Liver Function Tests: Recent Labs  Lab 03/23/24 0902 03/24/24 0842 03/25/24 0656 03/26/24 0711 03/27/24 0556  AST 45* 41 35  27 20  ALT 29 28 27 24 20   ALKPHOS 45 40 35* 36* 35*  BILITOT 1.0 0.7 0.4 0.3 0.5  PROT 6.2* 5.6* 4.9* 4.9* 5.0*  ALBUMIN 3.3* 3.0* 2.8* 2.7* 2.8*   Recent Labs  Lab 03/23/24 0902  LIPASE 24   No results for input(s): AMMONIA in the last 168 hours. Coagulation Profile: No results for input(s): INR, PROTIME in the last 168 hours. Cardiac Enzymes: No results for input(s): CKTOTAL, CKMB, CKMBINDEX, TROPONINI in the last 168 hours. BNP (last 3 results) No results for input(s): PROBNP in the last 8760 hours. HbA1C: No results for input(s): HGBA1C in the last 72 hours. CBG: No results for input(s): GLUCAP in the last 168 hours. Lipid Profile: No results for input(s): CHOL, HDL, LDLCALC, TRIG, CHOLHDL, LDLDIRECT in the last 72 hours. Thyroid  Function Tests: No results for input(s): TSH, T4TOTAL, FREET4, T3FREE, THYROIDAB in the last 72 hours. Anemia Panel: No results for input(s): VITAMINB12, FOLATE, FERRITIN, TIBC, IRON, RETICCTPCT in the last 72 hours. Sepsis Labs: No results for input(s): PROCALCITON, LATICACIDVEN in the last 168 hours.  Recent Results (from the past 240 hours)  Gastrointestinal Panel by PCR , Stool     Status: None   Collection Time: 03/18/24 12:37 AM   Specimen: Stool  Result Value Ref Range Status   Campylobacter species NOT DETECTED NOT DETECTED Final   Plesimonas shigelloides NOT DETECTED NOT DETECTED Final   Salmonella species NOT DETECTED NOT DETECTED Final   Yersinia enterocolitica NOT DETECTED NOT DETECTED Final   Vibrio species NOT DETECTED NOT DETECTED Final   Vibrio cholerae NOT DETECTED NOT DETECTED Final   Enteroaggregative E coli (EAEC) NOT DETECTED NOT DETECTED Final   Enteropathogenic E coli (EPEC) NOT DETECTED NOT DETECTED Final   Enterotoxigenic E coli (ETEC) NOT DETECTED NOT DETECTED Final   Shiga like toxin producing E coli (STEC) NOT DETECTED NOT DETECTED Final    Shigella/Enteroinvasive E coli (EIEC) NOT DETECTED NOT DETECTED Final   Cryptosporidium NOT DETECTED NOT DETECTED Final   Cyclospora cayetanensis NOT DETECTED NOT DETECTED Final   Entamoeba histolytica NOT DETECTED NOT DETECTED Final   Giardia lamblia NOT DETECTED NOT DETECTED Final   Adenovirus F40/41 NOT DETECTED NOT DETECTED Final   Astrovirus NOT DETECTED NOT DETECTED Final   Norovirus GI/GII NOT DETECTED NOT DETECTED Final   Rotavirus A NOT DETECTED NOT DETECTED Final   Sapovirus (I, II, IV, and V) NOT DETECTED NOT DETECTED Final    Comment: Performed at [facility], [address], [place], [place] [address]    Radiology Studies: No results found.  Scheduled Meds:  acetaminophen   1,000 mg Oral TID   feeding supplement  237  mL Oral BID BM   losartan   50 mg Oral Daily   metoprolol  tartrate  5 mg Intravenous Q8H   pantoprazole   40 mg Oral Daily   rosuvastatin   10 mg Oral QHS   saccharomyces boulardii  250 mg Oral BID   Continuous Infusions:  sodium chloride  75 mL/hr at 03/27/24 1151     LOS: 3 days   Elvan Sor, MD [facility] Available via Epic secure chat 7am-7pm After these hours, please refer to coverage provider listed on [email or web address] 03/27/2024, 2:14 PM

## 2024-03-27 NOTE — Plan of Care (Signed)
  Problem: Education: Goal: Knowledge of General Education information will improve Description: Including pain rating scale, medication(s)/side effects and non-pharmacologic comfort measures Outcome: Progressing   Problem: Clinical Measurements: Goal: Ability to maintain clinical measurements within normal limits will improve Outcome: Progressing   Problem: Activity: Goal: Risk for activity intolerance will decrease Outcome: Progressing   Problem: Nutrition: Goal: Adequate nutrition will be maintained Outcome: Progressing   Problem: Coping: Goal: Level of anxiety will decrease Outcome: Progressing   Problem: Pain Managment: Goal: General experience of comfort will improve and/or be controlled Outcome: Progressing   Problem: Safety: Goal: Ability to remain free from injury will improve Outcome: Progressing

## 2024-03-28 DIAGNOSIS — I160 Hypertensive urgency: Secondary | ICD-10-CM | POA: Diagnosis not present

## 2024-03-28 LAB — HEPATIC FUNCTION PANEL
ALT: 18 U/L (ref 0–44)
AST: 28 U/L (ref 15–41)
Albumin: 3.2 g/dL — ABNORMAL LOW (ref 3.5–5.0)
Alkaline Phosphatase: 39 U/L (ref 38–126)
Bilirubin, Direct: <0.1 mg/dL (ref 0.0–0.2)
Total Bilirubin: <0.2 mg/dL (ref 0.0–1.2)
Total Protein: 5 g/dL — ABNORMAL LOW (ref 6.5–8.1)

## 2024-03-28 LAB — CBC
HCT: 27.6 % — ABNORMAL LOW (ref 36.0–46.0)
Hemoglobin: 9.1 g/dL — ABNORMAL LOW (ref 12.0–15.0)
MCH: 32.3 pg (ref 26.0–34.0)
MCHC: 33 g/dL (ref 30.0–36.0)
MCV: 97.9 fL (ref 80.0–100.0)
Platelets: 160 K/uL (ref 150–400)
RBC: 2.82 MIL/uL — ABNORMAL LOW (ref 3.87–5.11)
RDW: 17.7 % — ABNORMAL HIGH (ref 11.5–15.5)
WBC: 5.3 K/uL (ref 4.0–10.5)
nRBC: 0 % (ref 0.0–0.2)

## 2024-03-28 LAB — BASIC METABOLIC PANEL WITH GFR
Anion gap: 11 (ref 5–15)
BUN: 10 mg/dL (ref 8–23)
CO2: 21 mmol/L — ABNORMAL LOW (ref 22–32)
Calcium: 8.6 mg/dL — ABNORMAL LOW (ref 8.9–10.3)
Chloride: 107 mmol/L (ref 98–111)
Creatinine, Ser: 0.61 mg/dL (ref 0.44–1.00)
GFR, Estimated: >60 mL/min (ref >60)
Glucose, Bld: 109 mg/dL — ABNORMAL HIGH (ref 70–99)
Potassium: 4 mmol/L (ref 3.5–5.1)
Sodium: 139 mmol/L (ref 135–145)

## 2024-03-28 LAB — PHOSPHORUS: Phosphorus: 2.7 mg/dL (ref 2.5–4.6)

## 2024-03-28 LAB — MAGNESIUM: Magnesium: 1.7 mg/dL (ref 1.7–2.4)

## 2024-03-28 MED ORDER — OXYCODONE HCL 5 MG PO TABS: 5.0000 mg | ORAL_TABLET | Freq: Four times a day (QID) | ORAL | 0 refills | Status: DC | PRN

## 2024-03-28 MED ORDER — SACCHAROMYCES BOULARDII 250 MG PO CAPS: 250.0000 mg | ORAL_CAPSULE | Freq: Two times a day (BID) | ORAL | 0 refills | Status: DC

## 2024-03-28 MED ORDER — LOSARTAN POTASSIUM 50 MG PO TABS: 50.0000 mg | ORAL_TABLET | Freq: Every day | ORAL | 0 refills | Status: DC

## 2024-03-28 NOTE — Plan of Care (Signed)

## 2024-03-28 NOTE — Discharge Summary (Signed)
 [facility]  Physician Discharge Summary   Patient ID: Caroline Snyder MRN: [id number] DOB/AGE: 1941/06/15 83 y.o.  Admit date: 03/23/2024 Discharge date: 03/28/2024    PCP: [name], NP  DISCHARGE DIAGNOSES:    Hypertensive urgency   Hyperlipidemia   Depression, recurrent (HCC)   GERD (gastroesophageal reflux disease)   Primary cholangiocarcinoma of extrahepatic bile duct (HCC)   Vascular dementia (HCC)   Protein-calorie malnutrition, severe   Cancer associated pain   RECOMMENDATIONS FOR OUTPATIENT FOLLOW UP: Follow-up with outpatient providers   Home Health: None Equipment/Devices: None  CODE STATUS: DNR  DISCHARGE CONDITION: fair  Diet recommendation: As before  INITIAL HISTORY: 83 y.o. female with medical history significant of abnormal chest CT, acute encephalopathy, altered mental status, history of memory loss, cholangiocarcinoma, external hemorrhoids, GERD, benign neoplasm of colon, depression, hyperlipidemia, hypertensive emergency, migraine headaches, osteoporosis who was discharged from the hospital yesterday after 6-day hospitalization due to acute colitis/ileitis, seen by GI and undergoing an ERCP on 03/19/2024 for biliary stent exchange who is returning to the hospital due to nausea, vomiting and diarrhea with development of abdominal pain radiating to her back.    Abdominal pain and nausea has been improved and her diet has been and went from clears to full.  Tolerated full's and went to soft and per son she is tolerating soft his morning.  Palliative is adjusting further pain regimen   HOSPITAL COURSE:    # Nausea, vomiting and diarrhea Associated with Abdominal pain In the setting of chemo and Treatment for Primary cholangiocarcinoma of extrahepatic bile duct [facility]):  Patient was treated symptomatically.  Oncology and palliative care were consulted.  Medications were adjusted.  Improved today.  Okay with the discharge plan.  Discussed  with son as well.   Essential Hypertension Presenting with Hypertensive Urgency:  Will be discharged on lower dose of ARB.  # Acute Colitis/Ileitis: Completed course of antibiotics.  Was started on probiotics.    # Hypophosphatemia  # Hypomagnesemia, mag repleted. # Mild hyponatremia, resolved. # Mild metabolic acidosis, resolved. # Hypocalcemia due to hypoalbuminemia.   Monitor electrolytes daily  Hyperlipidemia: Resumed Rosuvastatin  10 mg p.o. daily.   Depression, Recurrent (HCC) /  Vascular Dementia (HCC):    Normocytic Anemia: Secondary to hemodilution and chemotherapy  Anemia Panel done showed an iron level 45, TIBC 237, ferritin level of 86, folate level of 16.8 and a vitamin B12 of 328.    GERD (gastroesophageal reflux disease)/GI Prophylaxis   Underweight / Severe Protein Calorie Malnutrition: Complicates overall prognosis and care. Estimated body mass index is 18.13 kg/m as calculated from the following:   Height as of this encounter: 5' (1.524 m).   Weight as of this encounter: 42.1 kg. Nutritionist Consulted and awaiting evaluation      Patient is stable.  Improvement noted over the last 48 hours.  Okay for discharge home today.  PERTINENT LABS:  The results of significant diagnostics from this hospitalization (including imaging, microbiology, ancillary and laboratory) are listed below for reference.    Labs:   Basic Metabolic Panel: Recent Labs  Lab 03/24/24 0842 03/25/24 0656 03/26/24 0711 03/27/24 0556 03/28/24 0725  NA 133* 135 137 139 139  K 3.8 3.5 4.0 3.5 4.0  CL 103 103 109 107 107  CO2 20* 22 23 23  21*  GLUCOSE 141* 108* 117* 116* 109*  BUN 16 11 11 12 10   CREATININE 0.58 0.67 0.66 0.63 0.61  CALCIUM  8.8* 8.2* 8.4* 8.4* 8.6*  MG 1.5*  2.2 1.9 1.7 1.7  PHOS 2.1* 2.5 2.2* 3.5 2.7   Liver Function Tests: Recent Labs  Lab 03/24/24 0842 03/25/24 0656 03/26/24 0711 03/27/24 0556 03/28/24 0725  AST 41 35 27 20 28   ALT 28 27 24 20 18    ALKPHOS 40 35* 36* 35* 39  BILITOT 0.7 0.4 0.3 0.5 <0.2  PROT 5.6* 4.9* 4.9* 5.0* 5.0*  ALBUMIN 3.0* 2.8* 2.7* 2.8* 3.2*   Recent Labs  Lab 03/23/24 0902  LIPASE 24    CBC: Recent Labs  Lab 03/23/24 0902 03/24/24 0842 03/25/24 0656 03/26/24 0711 03/27/24 0556 03/28/24 0725  WBC 8.1 7.4 4.6 4.3 3.8* 5.3  NEUTROABS 7.3  --  3.8 3.3  --   --   HGB 12.1 10.7* 9.3* 9.2* 9.0* 9.1*  HCT 36.1 33.4* 29.4* 27.6* 27.9* 27.6*  MCV 95.5 97.7 99.0 97.5 98.6 97.9  PLT 196 188 166 135* 137* 160     IMAGING STUDIES DG ERCP Result Date: 03/27/2024 CLINICAL DATA:  Cholangiocarcinoma.  Stent change. EXAM: ERCP TECHNIQUE: Multiple spot images obtained with the fluoroscopic device and submitted for interpretation post-procedure. FLUOROSCOPY: Radiation Exposure Index (as provided by the fluoroscopic device): 21.81 mGy Kerma COMPARISON:  CT 03/16/2024 FINDINGS: Plastic biliary stent was removed. Cholangiogram demonstrates narrowing and irregularity of the mid common bile duct. A metallic biliary stent was placed in the common bile duct. IMPRESSION: Focal narrowing and irregularity in the mid common bile duct. Placement of metallic biliary stent. These images were submitted for radiologic interpretation only. Please see the procedural report. Electronically Signed   By: Juliene Balder M.D.   On: 03/27/2024 17:19   MR LUMBAR SPINE W WO CONTRAST Result Date: 03/23/2024 CLINICAL DATA:  History of biliary cancer. Question metastatic disease. Back pain. EXAM: MRI LUMBAR SPINE WITHOUT AND WITH CONTRAST TECHNIQUE: Multiplanar and multiecho pulse sequences of the lumbar spine were obtained without and with intravenous contrast. CONTRAST:  5mL GADAVIST  GADOBUTROL  1 MMOL/ML IV SOLN COMPARISON:  Radiography 11/17/2021 FINDINGS: Segmentation:  5 lumbar type vertebral bodies. Alignment: Degenerative anterolisthesis of 4 mm at L3-4 and 6 mm at L4-5. Vertebrae: No fracture or focal bone lesion. No evidence of regional  metastatic disease. Edema and enhancement of the facet arthropathy at L3-4 and L4-5. Conus medullaris and cauda equina: Conus extends to the L1 level. Conus and cauda equina appear normal. Paraspinal and other soft tissues: Negative Disc levels: No significant finding from T11-12 through L1-2. L2-3: Mild disc bulge. Moderate bilateral facet degeneration and hypertrophy. Mild canal narrowing but no neural compression. L3-4: Advanced bilateral facet arthropathy with 4 mm of degenerative anterolisthesis. Broad-based disc herniation. Moderate to severe multifactorial stenosis that could cause neural compression on either or both sides. This is slightly worse on the right. L4-5: Advanced bilateral facet arthropathy with anterolisthesis of 6 mm. Shallow protrusion of the disc. Moderate multifactorial stenosis with potential for neural compression in the lateral recesses. This is more pronounced on the right. Moderate foraminal narrowing on the right as well. L5-S1: No disc abnormality.  Mild facet degeneration.  No stenosis. IMPRESSION: 1. No evidence of metastatic disease to the lumbar spine. 2. L3-4: Advanced bilateral facet arthropathy with 4 mm of anterolisthesis. Broad-based disc herniation. Moderate to severe multifactorial stenosis that could cause neural compression on either or both sides. This is slightly worse on the right. The facet arthritis could certainly be painful. 3. L4-5: Advanced bilateral facet arthropathy with 6 mm of anterolisthesis. Shallow protrusion of the disc. Moderate multifactorial stenosis with potential  for neural compression in the lateral recesses, more pronounced on the right. Moderate foraminal narrowing on the right as well. The facet arthritis could be painful. 4. L2-3: Disc bulge. Moderate bilateral facet degeneration and hypertrophy. Mild canal narrowing but no neural compression. 5. L5-S1: Mild facet degeneration. No stenosis. Electronically Signed   By: Oneil Officer M.D.   On:  03/23/2024 12:54   MR THORACIC SPINE W WO CONTRAST Result Date: 03/23/2024 CLINICAL DATA:  Metastatic disease evaluation. Bile duct cancer. Surgery yesterday. EXAM: MRI THORACIC WITHOUT AND WITH CONTRAST TECHNIQUE: Multiplanar and multiecho pulse sequences of the thoracic spine were obtained without and with intravenous contrast. CONTRAST:  5mL GADAVIST  GADOBUTROL  1 MMOL/ML IV SOLN COMPARISON:  None Available. FINDINGS: Alignment:  No significant malalignment.  Minimal scoliosis. Vertebrae: No fracture or focal bone lesion. No evidence of bony metastatic disease. Cord:  No cord compression or metastatic disease to the cord. Paraspinal and other soft tissues: Tiny pleural effusions layering dependently. Disc levels: No significant disc level finding. No compressive stenosis of the canal or foramina. 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 IMPRESSION: 1. No evidence of metastatic disease to the thoracic spine. 2. Tiny pleural effusions layering dependently. Electronically Signed   By: Oneil Officer M.D.   On: 03/23/2024 12:49   CT Angio Abdomen W and/or Wo Contrast Result Date: 03/23/2024 CLINICAL DATA:  Aortic aneurysm suspected. Nausea vomiting and diarrhea. Abdominal pain radiating to back. History of bile duct cancer. * Tracking Code: BO * EXAM: CT ANGIOGRAPHY ABDOMEN TECHNIQUE: Multidetector CT imaging of the abdomen was performed using the standard protocol during bolus administration of intravenous contrast. Multiplanar reconstructed images and MIPs were obtained and reviewed to evaluate the vascular anatomy. RADIATION DOSE REDUCTION: This exam was performed according to the departmental dose-optimization program which includes automated exposure control, adjustment of the mA and/or kV according to patient size and/or use of iterative reconstruction technique. CONTRAST:  90mL OMNIPAQUE  IOHEXOL  350 MG/ML SOLN COMPARISON:  CT scan abdomen and pelvis from  03/16/2024. FINDINGS: VASCULAR Aorta: Normal caliber aorta without aneurysm, dissection, vasculitis or significant stenosis. Celiac: Patent without evidence of aneurysm, dissection, vasculitis or significant stenosis. SMA: Patent without evidence of aneurysm, dissection, vasculitis or significant stenosis. 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 All the renal arteries are patent without evidence of aneurysm, dissection, vasculitis, fibromuscular dysplasia or significant stenosis. IMA: Small caliber. Patent without evidence of aneurysm, dissection, vasculitis or significant stenosis. Inflow: Patent without evidence of aneurysm, dissection, vasculitis or significant stenosis. Veins: No obvious venous abnormality within the limitations of this arterial phase study. Review of the MIP images confirms the above findings. NON-VASCULAR Lower chest: There are patchy atelectatic changes in the visualized lung bases. No overt consolidation. No pleural effusion. The heart is normal in size. No pericardial effusion. Hepatobiliary: The liver is normal in size. Non-cirrhotic configuration. No suspicious mass. Since the prior study, there is exchange of common bile duct stent. There is new wider stent extending from the common hepatic duct into the second part of duodenum. There is resultant pneumobilia mainly in the left hepatic lobe. No intrahepatic bile duct dilation. Very small volume 2-3 mm calcified gallstones noted in the fundus region without imaging signs of acute cholecystitis. Normal gallbladder wall thickness. No pericholecystic inflammatory changes. Pancreas: Unremarkable. No pancreatic ductal dilatation or surrounding inflammatory changes. Spleen: Within normal limits. No focal lesion. Adrenals/Urinary Tract: Adrenal glands are unremarkable. No suspicious renal mass. No  nephroureterolithiasis or obstructive uropathy on either side Stomach/Bowel: No disproportionate dilation of  the small or large bowel loops. No evidence of abnormal bowel wall thickening or inflammatory changes. The appendix is unremarkable. Vascular/Lymphatic: No ascites or pneumoperitoneum. No abdominal or pelvic lymphadenopathy, by size criteria. No aneurysmal dilation of the major abdominal arteries. There are mild peripheral atherosclerotic vascular calcifications of the aorta and its major branches. Other: There is a tiny fat containing umbilical hernia. The soft tissues and abdominal wall are otherwise unremarkable. Musculoskeletal: No suspicious osseous lesions. There are mild multilevel degenerative changes in the visualized spine. IMPRESSION: 1. No acute inflammatory process identified within the abdomen or pelvis. 2. No aortic aneurysm, dissection or periaortic fat stranding. 3. Multiple other nonacute observations, as described above. Aortic Atherosclerosis (ICD10-I70.0). Electronically Signed   By: Ree Molt M.D.   On: 03/23/2024 10:26   CT ABDOMEN PELVIS W CONTRAST Result Date: 03/16/2024 CLINICAL DATA:  Nausea vomiting diarrhea history of bile duct cancer EXAM: CT ABDOMEN AND PELVIS WITH CONTRAST TECHNIQUE: Multidetector CT imaging of the abdomen and pelvis was performed using the standard protocol following bolus administration of intravenous contrast. RADIATION DOSE REDUCTION: This exam was performed according to the departmental dose-optimization program which includes automated exposure control, adjustment of the mA and/or kV according to patient size and/or use of iterative reconstruction technique. CONTRAST:  OMNIPAQUE  IOHEXOL  300 MG/ML  SOLN COMPARISON:  CT 12/29/2023, MRI 12/18/2023 FINDINGS: Lower chest: Lung bases demonstrate no acute airspace disease. Hepatobiliary: Hepatic steatosis. Pneumobilia. Common bile duct stent remains in place. No biliary distension. Decompressed gallbladder with small stone at the fundus. Indistinct hazy density surrounding the bile duct stent at the porta  hepatis. Narrowed appearance of the portal vein at the porta hepatis by indistinct surrounding soft tissue, series 2, image 24, coronal series 8, image 41, and presumably related to the known history of bowel duct malignancy. Pancreas: No ductal dilatation.  No definite inflammatory change. Spleen: Normal in size without focal abnormality. Adrenals/Urinary Tract: Adrenal glands are normal. Kidneys show no hydronephrosis. The bladder is unremarkable Stomach/Bowel: The stomach is decompressed. No dilated small bowel. Areas of wall thickening and pericolonic stranding, involves the cecum, ascending colon, hepatic flexure and transverse colon. Similar changes at the sigmoid colon. Negative appendix. Diverticular disease. Some fluid-filled small bowel in the right lower quadrant, mucosal enhancement of the terminal ileum and pelvic small bowel loops. Vascular/Lymphatic: Aortic atherosclerosis. No aneurysm. Small porta hepatis lymph nodes measuring up to 8 mm. As stated previously, focal narrowed appearance of the portal vein but without thrombus or occlusion. Reproductive: Status post hysterectomy. No adnexal masses. Other: Negative for pelvic effusion or free air. Musculoskeletal: No acute or suspicious osseous abnormality. IMPRESSION: IMPRESSION 1. Colon wall thickening and pericolonic edema extends from the cecum through the transverse colon and also involving the sigmoid colon consistent with colitis of infectious, inflammatory or ischemic etiology. Fluid-filled small bowel in the right lower quadrant with mild mucosal enhancement of terminal ileum consistent with ileitis. 2. Common bile duct stent remains in place with pneumobilia. No biliary distension. Indistinct hazy soft tissue density at the porta hepatis, surrounding common bile duct stent and now with interval focal narrowed appearance of the portal vein, and presumably elated to the history of known bile duct malignancy. No evidence for portal vein thrombus  or occlusion on this exam. 3. Hepatic steatosis. 4. Aortic atherosclerosis. Aortic Atherosclerosis (ICD10-I70.0). Electronically Signed   By: Luke Bun M.D.   On: 03/16/2024 22:01  DISCHARGE EXAMINATION: Vitals:   03/27/24 2103 03/28/24 0509 03/28/24 1053 03/28/24 1417  BP: (!) 190/84 (!) 152/71 137/67 (!) 147/67  Pulse: 78 72 78 72  Resp: 16 18  15   Temp: 98.3 F (36.8 C) 98 F (36.7 C)  97.8 F (36.6 C)  TempSrc: Oral Oral  Oral  SpO2: 98% 98%  100%  Weight:      Height:       General appearance: Awake alert.  In no distress Resp: Clear to auscultation bilaterally.  Normal effort Cardio: S1-S2 is normal regular.  No S3-S4.  No rubs murmurs or bruit GI: Abdomen is soft.  Nontender nondistended.  Bowel sounds are present normal.  No masses organomegaly  DISPOSITION: Home  Discharge Instructions     Call MD for:  difficulty breathing, headache or visual disturbances   Complete by: As directed    Call MD for:  extreme fatigue   Complete by: As directed    Call MD for:  persistant dizziness or light-headedness   Complete by: As directed    Call MD for:  persistant nausea and vomiting   Complete by: As directed    Call MD for:  severe uncontrolled pain   Complete by: As directed    Call MD for:  temperature >100.4   Complete by: As directed    Diet - low sodium heart healthy   Complete by: As directed    Discharge instructions   Complete by: As directed    Please take your medications as prescribed.  Seek attention if your symptoms recur.  You were cared for by a hospitalist during your hospital stay. If you have any questions about your discharge medications or the care you received while you were in the hospital after you are discharged, you can call the unit and asked to speak with the hospitalist on call if the hospitalist that took care of you is not available. Once you are discharged, your primary care physician will handle any further medical issues. Please note  that NO REFILLS for any discharge medications will be authorized once you are discharged, as it is imperative that you return to your primary care physician (or establish a relationship with a primary care physician if you do not have one) for your aftercare needs so that they can reassess your need for medications and monitor your lab values. If you do not have a primary care physician, you can call [phone] for a physician referral.   Increase activity slowly   Complete by: As directed    No wound care   Complete by: As directed          Allergies as of 03/28/2024       Reactions   Aricept  [donepezil ] Diarrhea   Vioxx [rofecoxib] Nausea Only   Pt. Can't remember what reaction was but asks to leave on her profile.         Medication List     STOP taking these medications    amoxicillin -clavulanate 875-125 MG tablet Commonly known as: AUGMENTIN    capecitabine  500 MG tablet Commonly known as: XELODA        TAKE these medications    aspirin  EC 81 MG tablet Take 1 tablet (81 mg total) by mouth daily. Swallow whole.   Ensure Take 237 mLs by mouth 2 (two) times daily between meals.   LORazepam  0.5 MG tablet Commonly known as: ATIVAN  Take 0.5 tablets (0.25 mg total) by mouth every 6 (six) hours as needed (nausea not  relieved by Zofran  (Ondansetron )).   losartan  50 MG tablet Commonly known as: COZAAR  Take 1 tablet (50 mg total) by mouth daily. What changed:  medication strength how much to take additional instructions   ondansetron  8 MG tablet Commonly known as: ZOFRAN  Take 1 tablet (8 mg total) by mouth every 8 (eight) hours as needed for nausea or vomiting.   oxyCODONE  5 MG immediate release tablet Commonly known as: Oxy IR/ROXICODONE  Take 1 tablet (5 mg total) by mouth every 6 (six) hours as needed for severe pain (pain score 7-10).   pantoprazole  40 MG tablet Commonly known as: PROTONIX  Take 40 mg by mouth daily as needed (for stomach acid).    prochlorperazine  10 MG tablet Commonly known as: COMPAZINE  Take 0.5 tablets (5 mg total) by mouth every 6 (six) hours as needed for refractory nausea / vomiting.   rosuvastatin  20 MG tablet Commonly known as: CRESTOR  Take 0.5 tablets (10 mg total) by mouth daily. What changed: when to take this   saccharomyces boulardii 250 MG capsule Commonly known as: FLORASTOR Take 1 capsule (250 mg total) by mouth 2 (two) times daily.   TYLENOL  500 MG tablet Generic drug: acetaminophen  Take 500-1,000 mg by mouth every 8 (eight) hours as needed (for pain).          Follow-up Information     [facility].   Specialty: Home Health Services Contact information: [address] [place] [address] [phone]         [name], NP. Schedule an appointment as soon as possible for a visit in 1 week(s).   Specialty: Geriatric Medicine Why: post hospitalization follow up Contact information: [address] [place] [address] [phone]                 TOTAL DISCHARGE TIME: 35 minutes  Caroline Snyder  [facility] Pager on [email or web address]  03/29/2024, 10:09 AM

## 2024-03-29 ENCOUNTER — Telehealth: Payer: Self-pay | Admitting: Oncology

## 2024-03-29 NOTE — Telephone Encounter (Signed)
 Caroline Snyder scheduled for [facility] and he is aware of Caroline Snyder follow up appointment scheduled on 9/10.

## 2024-03-30 ENCOUNTER — Telehealth: Payer: Self-pay

## 2024-03-30 DIAGNOSIS — G893 Neoplasm related pain (acute) (chronic): Secondary | ICD-10-CM | POA: Diagnosis not present

## 2024-03-30 DIAGNOSIS — M48061 Spinal stenosis, lumbar region without neurogenic claudication: Secondary | ICD-10-CM | POA: Diagnosis not present

## 2024-03-30 DIAGNOSIS — M1990 Unspecified osteoarthritis, unspecified site: Secondary | ICD-10-CM | POA: Diagnosis not present

## 2024-03-30 DIAGNOSIS — K529 Noninfective gastroenteritis and colitis, unspecified: Secondary | ICD-10-CM | POA: Diagnosis not present

## 2024-03-30 DIAGNOSIS — F0153 Vascular dementia, unspecified severity, with mood disturbance: Secondary | ICD-10-CM | POA: Diagnosis not present

## 2024-03-30 DIAGNOSIS — C240 Malignant neoplasm of extrahepatic bile duct: Secondary | ICD-10-CM | POA: Diagnosis not present

## 2024-03-30 DIAGNOSIS — J90 Pleural effusion, not elsewhere classified: Secondary | ICD-10-CM | POA: Diagnosis not present

## 2024-03-30 DIAGNOSIS — M810 Age-related osteoporosis without current pathological fracture: Secondary | ICD-10-CM | POA: Diagnosis not present

## 2024-03-30 DIAGNOSIS — F339 Major depressive disorder, recurrent, unspecified: Secondary | ICD-10-CM | POA: Diagnosis not present

## 2024-03-30 DIAGNOSIS — D630 Anemia in neoplastic disease: Secondary | ICD-10-CM | POA: Diagnosis not present

## 2024-03-30 NOTE — Telephone Encounter (Signed)
 Copied from CRM [id number]. Topic: Clinical - Home Health Verbal Orders >> Mar 30, 2024  2:20 PM Marda MATSU wrote: Caller/Agency: Sherrie with [facility] Callback Number: [phone] Service Requested: Physical Therapy Frequency: 1w5 Any new concerns about the patient? Yes  Complaining about thoracic back pain (patient took oxycodone  and it eased up a bit)

## 2024-03-30 NOTE — Telephone Encounter (Signed)
 Please advise if verbal orders are fine. Also medication was given to patient. Message routed to PCP Caro, Harlene POUR, NP

## 2024-03-30 NOTE — Telephone Encounter (Signed)
 Noted, okay for PT  She will need hospital follow up as well.

## 2024-04-03 NOTE — Telephone Encounter (Signed)
 Spoke with patient' son and have made an appointment for Hospital Follow-up with Leonarda Burdock, C, NP on the 04/17/2024 and given the verbal order to [facility] from [facility] for PT.  Message sent to [name], NP

## 2024-04-03 NOTE — Telephone Encounter (Signed)
 Message routed to clinical intake for today Caroline Snyder.B/CMA to call PT back and follow up.

## 2024-04-04 ENCOUNTER — Telehealth: Payer: Self-pay

## 2024-04-04 DIAGNOSIS — M1990 Unspecified osteoarthritis, unspecified site: Secondary | ICD-10-CM | POA: Diagnosis not present

## 2024-04-04 DIAGNOSIS — J90 Pleural effusion, not elsewhere classified: Secondary | ICD-10-CM | POA: Diagnosis not present

## 2024-04-04 DIAGNOSIS — F0153 Vascular dementia, unspecified severity, with mood disturbance: Secondary | ICD-10-CM | POA: Diagnosis not present

## 2024-04-04 DIAGNOSIS — M810 Age-related osteoporosis without current pathological fracture: Secondary | ICD-10-CM | POA: Diagnosis not present

## 2024-04-04 DIAGNOSIS — G893 Neoplasm related pain (acute) (chronic): Secondary | ICD-10-CM | POA: Diagnosis not present

## 2024-04-04 DIAGNOSIS — F339 Major depressive disorder, recurrent, unspecified: Secondary | ICD-10-CM | POA: Diagnosis not present

## 2024-04-04 DIAGNOSIS — K529 Noninfective gastroenteritis and colitis, unspecified: Secondary | ICD-10-CM | POA: Diagnosis not present

## 2024-04-04 DIAGNOSIS — M48061 Spinal stenosis, lumbar region without neurogenic claudication: Secondary | ICD-10-CM | POA: Diagnosis not present

## 2024-04-04 DIAGNOSIS — C240 Malignant neoplasm of extrahepatic bile duct: Secondary | ICD-10-CM | POA: Diagnosis not present

## 2024-04-04 DIAGNOSIS — D630 Anemia in neoplastic disease: Secondary | ICD-10-CM | POA: Diagnosis not present

## 2024-04-04 NOTE — Telephone Encounter (Signed)
 Copied from CRM [id number]. Topic: Clinical - Home Health Verbal Orders >> Apr 04, 2024  4:19 PM Susanna ORN wrote: Caller/Agency: Will, Occupational Therapist, with [facility] Callback Number: [phone] Service Requested: Occupational Therapy; completed OT eval Frequency: 1 time a week for 3 weeks Any new concerns about the patient? No  Will from [facility] given permission to the okay for the Home Health Verbal Orders for through our [facility] policy.

## 2024-04-09 ENCOUNTER — Telehealth: Payer: Self-pay

## 2024-04-09 NOTE — Telephone Encounter (Signed)
 Returned daughter-in-law's phone message, Nat to follow up on uncontrolled abdominal pain. Nat reported that patient was not taking any of the Oxycodone . Nat agreed to give the pain meds with Miralax as this was one of the concerns (constipation). She also expressed concern because they pain was expected to be much improved by now. Patient is to be seen in two days by Dr. Pasam and she will be reevaluated then based on taking the oxycodone  for pain control. Nat described her mother-in-laws pain as grimacing.   Dr. Autumn aware and agrees with current pain control and constipation preventative plan for now.

## 2024-04-11 ENCOUNTER — Emergency Department (HOSPITAL_COMMUNITY)
Admission: EM | Admit: 2024-04-11 | Discharge: 2024-04-11 | Disposition: A | Discharge status: Home / Self Care | Payer: Medicare (Managed Care) | Source: Ambulatory Visit

## 2024-04-11 ENCOUNTER — Emergency Department (HOSPITAL_COMMUNITY): Payer: Medicare (Managed Care)

## 2024-04-11 ENCOUNTER — Other Ambulatory Visit: Payer: Self-pay

## 2024-04-11 ENCOUNTER — Inpatient Hospital Stay: Payer: Medicare (Managed Care) | Attending: Oncology | Admitting: Oncology

## 2024-04-11 ENCOUNTER — Inpatient Hospital Stay: Payer: Medicare (Managed Care)

## 2024-04-11 ENCOUNTER — Encounter (HOSPITAL_COMMUNITY): Payer: Self-pay | Admitting: *Deleted

## 2024-04-11 ENCOUNTER — Encounter: Payer: Self-pay | Admitting: Oncology

## 2024-04-11 VITALS — BP 126/66 | HR 74 | Temp 97.7°F | Resp 16 | Ht 60.0 in | Wt 90.0 lb

## 2024-04-11 DIAGNOSIS — K219 Gastro-esophageal reflux disease without esophagitis: Secondary | ICD-10-CM | POA: Diagnosis not present

## 2024-04-11 DIAGNOSIS — C240 Malignant neoplasm of extrahepatic bile duct: Secondary | ICD-10-CM

## 2024-04-11 DIAGNOSIS — G893 Neoplasm related pain (acute) (chronic): Secondary | ICD-10-CM

## 2024-04-11 DIAGNOSIS — K5900 Constipation, unspecified: Secondary | ICD-10-CM | POA: Insufficient documentation

## 2024-04-11 DIAGNOSIS — C221 Intrahepatic bile duct carcinoma: Secondary | ICD-10-CM | POA: Insufficient documentation

## 2024-04-11 DIAGNOSIS — R410 Disorientation, unspecified: Secondary | ICD-10-CM | POA: Diagnosis not present

## 2024-04-11 DIAGNOSIS — I10 Essential (primary) hypertension: Secondary | ICD-10-CM | POA: Diagnosis not present

## 2024-04-11 DIAGNOSIS — Z79899 Other long term (current) drug therapy: Secondary | ICD-10-CM | POA: Insufficient documentation

## 2024-04-11 DIAGNOSIS — Z923 Personal history of irradiation: Secondary | ICD-10-CM | POA: Insufficient documentation

## 2024-04-11 DIAGNOSIS — R1013 Epigastric pain: Secondary | ICD-10-CM | POA: Diagnosis not present

## 2024-04-11 DIAGNOSIS — R0789 Other chest pain: Secondary | ICD-10-CM

## 2024-04-11 DIAGNOSIS — F0390 Unspecified dementia without behavioral disturbance: Secondary | ICD-10-CM | POA: Diagnosis not present

## 2024-04-11 DIAGNOSIS — H9209 Otalgia, unspecified ear: Secondary | ICD-10-CM | POA: Insufficient documentation

## 2024-04-11 DIAGNOSIS — Z7982 Long term (current) use of aspirin: Secondary | ICD-10-CM | POA: Diagnosis not present

## 2024-04-11 DIAGNOSIS — R978 Other abnormal tumor markers: Secondary | ICD-10-CM | POA: Diagnosis not present

## 2024-04-11 DIAGNOSIS — Z9221 Personal history of antineoplastic chemotherapy: Secondary | ICD-10-CM | POA: Diagnosis not present

## 2024-04-11 LAB — CBC WITH DIFFERENTIAL (CANCER CENTER ONLY)
Abs Immature Granulocytes: 0.02 K/uL (ref 0.00–0.07)
Basophils Absolute: 0.1 K/uL (ref 0.0–0.1)
Basophils Relative: 1 %
Eosinophils Absolute: 0.2 K/uL (ref 0.0–0.5)
Eosinophils Relative: 6 %
HCT: 32 % — ABNORMAL LOW (ref 36.0–46.0)
Hemoglobin: 11.1 g/dL — ABNORMAL LOW (ref 12.0–15.0)
Immature Granulocytes: 1 %
Lymphocytes Relative: 16 %
Lymphs Abs: 0.6 K/uL — ABNORMAL LOW (ref 0.7–4.0)
MCH: 33.3 pg (ref 26.0–34.0)
MCHC: 34.7 g/dL (ref 30.0–36.0)
MCV: 96.1 fL (ref 80.0–100.0)
Monocytes Absolute: 0.3 K/uL (ref 0.1–1.0)
Monocytes Relative: 8 %
Neutro Abs: 2.7 K/uL (ref 1.7–7.7)
Neutrophils Relative %: 68 %
Platelet Count: 249 K/uL (ref 150–400)
RBC: 3.33 MIL/uL — ABNORMAL LOW (ref 3.87–5.11)
RDW: 16.5 % — ABNORMAL HIGH (ref 11.5–15.5)
WBC Count: 4 K/uL (ref 4.0–10.5)
nRBC: 0 % (ref 0.0–0.2)

## 2024-04-11 LAB — CMP (CANCER CENTER ONLY)
ALT: 24 U/L (ref 0–44)
AST: 29 U/L (ref 15–41)
Albumin: 4.1 g/dL (ref 3.5–5.0)
Alkaline Phosphatase: 69 U/L (ref 38–126)
Anion gap: 8 (ref 5–15)
BUN: 15 mg/dL (ref 8–23)
CO2: 26 mmol/L (ref 22–32)
Calcium: 9.5 mg/dL (ref 8.9–10.3)
Chloride: 103 mmol/L (ref 98–111)
Creatinine: 0.91 mg/dL (ref 0.44–1.00)
GFR, Estimated: >60 mL/min (ref >60)
Glucose, Bld: 157 mg/dL — ABNORMAL HIGH (ref 70–99)
Potassium: 4.1 mmol/L (ref 3.5–5.1)
Sodium: 137 mmol/L (ref 135–145)
Total Bilirubin: 0.3 mg/dL (ref 0.0–1.2)
Total Protein: 6.9 g/dL (ref 6.5–8.1)

## 2024-04-11 LAB — URINALYSIS, ROUTINE W REFLEX MICROSCOPIC
Bilirubin Urine: NEGATIVE
Glucose, UA: NEGATIVE mg/dL
Hgb urine dipstick: NEGATIVE
Ketones, ur: NEGATIVE mg/dL
Leukocytes,Ua: NEGATIVE
Nitrite: NEGATIVE
Protein, ur: NEGATIVE mg/dL
Specific Gravity, Urine: >1.046 — ABNORMAL HIGH (ref 1.005–1.030)
pH: 6 (ref 5.0–8.0)

## 2024-04-11 LAB — TROPONIN T, HIGH SENSITIVITY
Troponin T High Sensitivity: <15 ng/L (ref 0–19)
Troponin T High Sensitivity: <15 ng/L (ref 0–19)

## 2024-04-11 LAB — LIPASE, BLOOD: Lipase: 12 U/L (ref 11–51)

## 2024-04-11 LAB — MAGNESIUM: Magnesium: 1.8 mg/dL (ref 1.7–2.4)

## 2024-04-11 MED ORDER — IOHEXOL 300 MG/ML  SOLN
100.0000 mL | Freq: Once | INTRAMUSCULAR | Status: DC | PRN
Start: 2024-04-11 — End: 2024-04-11

## 2024-04-11 MED ORDER — OXYCODONE HCL 5 MG PO TABS
5.0000 mg | ORAL_TABLET | Freq: Four times a day (QID) | ORAL | 0 refills | Status: DC | PRN
Start: 2024-04-11 — End: 2024-04-17

## 2024-04-11 MED ORDER — OXYCODONE HCL 5 MG PO TABS
5.0000 mg | ORAL_TABLET | Freq: Once | ORAL | Status: AC
  Administered 2024-04-11: 5 mg via ORAL
  Filled 2024-04-11: qty 1

## 2024-04-11 MED ORDER — LIDOCAINE 5 % EX PTCH: 1.0000 | MEDICATED_PATCH | CUTANEOUS | 0 refills | Status: DC

## 2024-04-11 MED ORDER — IOHEXOL 300 MG/ML  SOLN
80.0000 mL | Freq: Once | INTRAMUSCULAR | Status: AC | PRN
  Administered 2024-04-11: 80 mL via INTRAVENOUS

## 2024-04-11 NOTE — ED Notes (Signed)
 Called lab to verify they received urine and they confirmed

## 2024-04-11 NOTE — ED Notes (Signed)
 Pt went to the bathroom but did not provide urine sample at this time

## 2024-04-11 NOTE — Discharge Instructions (Signed)
 Take over-the-counter Tylenol  for pain with doses as directed on the packaging.  You may also try the lidocaine  patches that we are prescribing you for pain.  However, you do have narcotic pain medications to take for breakthrough pain.  Please utilize them as needed.  It does appear that you have some constipation as discussed take 1 capful of MiraLAX twice a day for 48 hours or until soft bowel movements and then titrate to effect thereafter.  Return to fevers, chills, lightheadedness, passout, severe pain or any new or worsening symptoms that are concerning to you.

## 2024-04-11 NOTE — Assessment & Plan Note (Signed)
 Please review oncology history for additional details and timeline of events.    On 12/18/2023, MRCP showed moderate to severe intrahepatic bile duct dilatation.  There was an abrupt beak like narrowing of the mid common bile duct at the level of the head of the pancreas.  Findings concerning for stricture at the level of head of pancreas.    CA 19-9 was increased at 180 on 12/19/2023.  On 12/21/2023, Dr. Wilhelmenia performed ERCP.  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  Stricture was malignant appearing.  The upper third of the main bile duct, left main hepatic duct and right main hepatic duct were moderately dilated, secondary to a stricture.  Extremely difficult cannulation requiring attempt at double wire, over pancreatic stent, precut fistulotomy to eventually gain biliary access. One temporary plastic biliary stent was placed into the ventral pancreatic duct.  One plastic biliary stent was placed in the CBD to traverse the stricture.  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  IHC stain for H. pylori was positive.   On 12/22/2023 CT chest with contrast showed no evidence of intrathoracic metastatic disease.  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   She did have consultation with Dr. Dasie.  Dr. Dasie did offer surgery as an option but because of the local extent of the disease, she will need to undergo Whipple surgery.  Patient and family members would like for her to avoid surgery as they are worried about her postoperative recovery from Whipple surgery.  They opted to proceed with concurrent chemoradiation instead. Treatment aims to control  disease, not cure.   She began concurrent chemoradiation with capecitabine  from 02/13/2024.  Capecitabine  was given at a dose of 800 mg/m twice daily on Monday to Friday during the course of radiation.  This translated to 1000 mg p.o. twice daily dosing for her.    She completed radiation treatments on 03/22/2024.  She underwent biliary stent exchange on 03/19/2024.  The stent is functioning well, with normal bilirubin and liver function tests.   No current concerns related to the stent or tumor progression based on recent imaging.  Originally was planning to repeat CT scan in approximately 3 months to follow-up on her disease status.  Since that she is being directed to the ED for intractable back pain/scapular pain, she may get imaging in the ED.  I will follow-up on the ED course and order scan accordingly.

## 2024-04-11 NOTE — ED Triage Notes (Signed)
 Brought over from [facility], pt has gallbladder cancer, pain has been out of controlled, sent to ED for eval and pain control. Rt ear pain, sudden onset while in the lobby.

## 2024-04-11 NOTE — Progress Notes (Signed)
 Patient completed visit with Dr. Autumn and was at Oncology check-out when she sat forward and moaned and grimaced per check-out staff. VS 76,22, 110/72, 99% RA. FACES PAIN believed to be 10/10 to upper and lower back. Willy, charge nurse in the ED given report and patient was transferred to ED per Dr. Autumn for intractable pain and unknown source of pain. Patient was brought over by this Clinical research associate and check-in. Further report provided to the DO. Charge Nurse, Willy at the stretcher side. Daughter-in-law accompanying.

## 2024-04-11 NOTE — Progress Notes (Signed)
 [facility]  ONCOLOGY CLINIC PROGRESS NOTE   Patient Care Team: [name], NP as PCP - General (Geriatric Medicine) Cleatus Collar, MD as Consulting Physician (Ophthalmology) Camillo Golas, MD as Consulting Physician (Ophthalmology) Winfred Curlee DEL, MD (Inactive) as Consulting Physician (Gynecology) Ardis Evalene CROME, RN as Oncology Nurse Navigator  PATIENT NAME: Caroline Snyder   MR#: [id number] DOB: 11/30/1940  Date of visit: 04/11/2024   ASSESSMENT & PLAN:   Caroline Snyder is a 83 y.o.  lady with a past medical history of vascular dementia, HTN, HLD, depression, migraines and GERD, was referred to our clinic for newly diagnosed adenocarcinoma of the biliary duct.   Primary cholangiocarcinoma of extrahepatic bile duct (HCC) Please review oncology history for additional details and timeline of events.    On 12/18/2023, MRCP showed moderate to severe intrahepatic bile duct dilatation.  There was an abrupt beak like narrowing of the mid common bile duct at the level of the head of the pancreas.  Findings concerning for stricture at the level of head of pancreas.    CA 19-9 was increased at 180 on 12/19/2023.  On 12/21/2023, Dr. Wilhelmenia performed ERCP.  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  Stricture was malignant appearing.  The upper third of the main bile duct, left main hepatic duct and right main hepatic duct were moderately dilated, secondary to a stricture.  Extremely difficult cannulation requiring attempt at double wire, over pancreatic stent, precut fistulotomy to eventually gain biliary access. One temporary plastic biliary stent was placed into the ventral pancreatic duct.  One plastic biliary stent was placed in the CBD to traverse the stricture.  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  IHC stain for H. pylori was positive.   On 12/22/2023 CT chest with contrast showed no evidence of intrathoracic metastatic disease.  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   She did have consultation with Dr. Dasie.  Dr. Dasie did offer surgery as an option but because of the local extent of the disease, she will need to undergo Whipple surgery.  Patient and family members would like for her to avoid surgery as they are worried about her postoperative recovery from Whipple surgery.  They opted to proceed with concurrent chemoradiation instead. Treatment aims to control disease, not cure.   She began concurrent chemoradiation with capecitabine  from 02/13/2024.  Capecitabine  was given at a dose of 800 mg/m twice daily on Monday to Friday during the course of radiation.  This translated to 1000 mg p.o. twice daily dosing for her.    She completed radiation treatments on 03/22/2024.  She underwent biliary stent exchange on 03/19/2024.  The stent is functioning well, with normal bilirubin and liver function tests.   No current concerns related to the stent or tumor progression based on recent imaging.  Originally was planning to repeat CT scan in approximately 3 months to follow-up on her disease status.  Since that she is being directed to the ED for intractable back pain/scapular pain, she may get imaging in the ED.  I will follow-up on the ED course and order scan accordingly.  Right-sided posterior chest  wall pain Severe pain under the shoulder blade, possibly related to muscle inflammation versus referred pain from biliary stent.  Pain management is crucial to improve quality of life.  - Originally we were planning to manage this outpatient.  She does take oxycodone  at home.  We recommended  Aleve in the morning with adequate hydration and heat pad alternating with cold pack.  Oxycodone  refilled.  - Patient continued to have worsening pain while she was in the clinic and started hunching over and hence she had to be directed to the ED for further evaluation (including imaging as needed) and management of intractable pain  Gastroesophageal reflux disease without esophagitis Reflux symptoms with frothy, foamy saliva. Currently taking Protonix , which should help manage symptoms. Additional antacid may be needed, especially with Aleve use. - Continue Protonix  once daily in the morning - Recommend Tums or Pepcid  chewable for additional relief  Constipation Bowel movements occurring every other day, which may contribute to back pain. Probiotics may help regulate bowel movements. - Recommend any probiotic containing Saccharomyces strain  Fatigue Fatigue likely due to recent illness and treatment. Blood work shows improvement in hemoglobin and other parameters. Multivitamin supplementation may help improve energy levels. - Recommend a multivitamin such as Centrum for elderly, once daily or every other day - Consider additional vitamin C supplementation if needed  Ear pain and aural fullness Ear pain with aural fullness, no signs of infection or external blockage. Possible tube blockage causing symptoms. Manipulation of the ear provides some relief. - Recommend over-the-counter ear drops with numbing agent - Advise manual manipulation of the ear (pulling upwards and outwards) to realign the tube  I reviewed lab results and outside records for this visit and discussed relevant results with the patient. Diagnosis, plan of care and treatment options were also discussed in detail with the patient. Opportunity provided to ask questions and answers provided to her apparent satisfaction. Provided instructions to call our clinic with any problems, questions or concerns prior to return visit. I  recommended to continue follow-up with PCP and sub-specialists. She verbalized understanding and agreed with the plan.   NCCN guidelines have been consulted in the planning of this patient's care.  I spent a total of 40 minutes during this encounter with the patient including review of chart and various tests results, discussions about plan of care and coordination of care plan.   Chinita Patten, MD  04/11/2024 12:03 PM  [facility] - A DEPT OF [facility] [address] [place] [address] Dept: [phone] Dept Fax: [phone]    CHIEF COMPLAINT/ REASON FOR VISIT:   Cholangiocarcinoma extrahepatic biliary duct  Current Treatment: Patient did not want to proceed with surgery.  Plan to begin her on concurrent chemoradiation with capecitabine .  INTERVAL HISTORY:    Discussed the use of AI scribe software for clinical note transcription with the patient, who gave verbal consent to proceed.  History of Present Illness  Caroline Snyder is an 83 year old female who presents with ear pain and back pain.  She experiences ear pain that began on Saturday, characterized by a clogged sensation and tingling, particularly when speaking. No ear discharge, headache, vision problems, or sinus issues are present. She attempted to use a Q-tip for relief, but it was ineffective. She mentioned ear pain starting Saturday, but did not report it during her recent hospital stay.  She reports significant back pain, especially under the  shoulder blade and in the lower back. The pain is severe and accompanied by tingling under the rib and breast area. A heating pad provides some relief. She has difficulty with bowel movements, occurring every other day, which she believes may contribute to her back pain.  She experiences reflux symptoms, described as frothy and foamy, and takes Protonix  daily for management. No nausea,  vomiting, or bad taste in her mouth is reported.  Her current medications include Tylenol  and oxycodone  for pain management, with oxycodone  primarily used at night to aid sleep and reduce confusion. She also takes Protonix  for reflux.    I have reviewed the past medical history, past surgical history, social history and family history with the patient and they are unchanged from previous note.  HISTORY OF PRESENT ILLNESS:   ONCOLOGY HISTORY:   83 y.o. lady with a past medical history of vascular dementia, HTN, HLD, depression, migraines and GERD, was admitted to the hospital on 12/18/2023 after she presented with intractable nausea and vomiting.  She was noted to be hypertensive and tachypneic.  Labs showed  AST/ALT 300s, and Alk Phos 315. RUQ US  with CBD dilation.  She was admitted for further workup and evaluation.   On 12/18/2023, MRCP showed moderate to severe intrahepatic bile duct dilatation.  There was an abrupt beak like narrowing of the mid common bile duct at the level of the head of the pancreas.  Findings concerning for stricture at the level of head of pancreas.  Mild diffuse edema within the pancreatic parenchyma noted, which was concerning for pancreatitis. Mild increase caliber of the main pancreatic duct which measured up to 4 mm at the level of the pancreatic neck, which abruptly decreases in caliber at the head of pancreas. Focal area of mild increased T2 signal with restricted diffusion in the anterior head of pancreas measures 2.2 x 1.2 by 2.2 cm. Cannot exclude pancreatic head neoplasm. Consider further evaluation with endoscopic ultrasound and tissue sampling.   CA 19-9 was increased at 180 on 12/19/2023.   On 12/21/2023, Dr. Wilhelmenia performed ERCP.  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  Stricture was malignant appearing.  The upper third of the main bile duct, left main hepatic duct and right main hepatic duct were moderately  dilated, secondary to a stricture.  Extremely difficult cannulation requiring attempt at double wire, over pancreatic stent, precut fistulotomy to eventually gain biliary access. One temporary plastic biliary stent was placed into the ventral pancreatic duct.  One plastic biliary stent was placed in the CBD to traverse the stricture.   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  IHC stain for H. pylori was positive.     On 12/22/2023 CT chest with contrast showed no evidence of intrathoracic metastatic disease.   Patient presented to clinic on 12/29/2023 to establish care with us .  She was in significant amount of abdominal/right chest wall pain and had to be directed to ED for further evaluation and admission.   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    She did have consultation with Dr. Dasie.  Dr. Dasie did offer surgery as an option but because of the local  extent of the disease, she will need to undergo Whipple surgery.   Patient and family members would like for her to avoid surgery as they are worried about her postoperative recovery from Whipple surgery.  They would like to proceed with concurrent chemoradiation instead.   She is scheduled to see Dr. Dewey later today and is scheduled to begin radiation treatment from 02/13/2024.  Will proceed with concurrent chemotherapy with dose reduced capecitabine  and will continue this during the course of radiation.  Caroline Snyder will be given a dose of 800 mg/m twice daily on Monday to Friday during the course of radiation.  This translated to 1000 mg p.o. twice daily dosing for her.  Prescription sent to specialty pharmacy.   REVIEW OF SYSTEMS:    Review of Systems  Musculoskeletal:  Positive for back pain.    All other pertinent systems were reviewed with the patient and are negative.  ALLERGIES: She is allergic to aricept  [donepezil ] and vioxx [rofecoxib].  MEDICATIONS:  Current Facility-Administered Medications  Medication Dose Route Frequency Provider Last Rate Last Admin   denosumab  (PROLIA ) injection 60 mg  60 mg Subcutaneous Once Eubanks, Jessica K, NP       Current Outpatient Medications  Medication Sig Dispense Refill   acetaminophen  (TYLENOL ) 500 MG tablet Take 500-1,000 mg by mouth every 8 (eight) hours as needed (for pain).     aspirin  EC 81 MG tablet Take 1 tablet (81 mg total) by mouth daily. Swallow whole. 30 tablet 11   Ensure (ENSURE) Take 237 mLs by mouth 2 (two) times daily between meals.     LORazepam  (ATIVAN ) 0.5 MG tablet Take 0.5 tablets (0.25 mg total) by mouth every 6 (six) hours as needed (nausea not relieved by Zofran  (Ondansetron )). 30 tablet 0   losartan  (COZAAR ) 50 MG tablet Take 1 tablet (50 mg total) by mouth daily. 30 tablet 0   ondansetron  (ZOFRAN ) 8 MG tablet Take 1 tablet (8 mg total) by mouth every 8 (eight) hours as needed for nausea or vomiting. 30 tablet 1   pantoprazole  (PROTONIX ) 40 MG tablet Take 40 mg by mouth daily as needed (for stomach acid).     prochlorperazine  (COMPAZINE ) 10 MG tablet Take 0.5 tablets (5 mg total) by mouth every 6 (six) hours as needed for refractory nausea / vomiting. 15 tablet 0   rosuvastatin  (CRESTOR ) 20 MG tablet Take 0.5 tablets (10 mg total) by mouth daily. (Patient taking differently: Take 10 mg by mouth at bedtime.)     saccharomyces boulardii (FLORASTOR) 250 MG capsule Take 1 capsule (250 mg total) by mouth 2 (two) times daily. 14 capsule 0   oxyCODONE  (OXY IR/ROXICODONE ) 5 MG immediate release tablet Take 1 tablet (5 mg total) by mouth every 6 (six) hours as needed for severe pain (pain score 7-10). 60 tablet 0   Facility-Administered Medications  Ordered in Other Visits  Medication Dose Route Frequency Provider Last Rate Last Admin   iohexol  (OMNIPAQUE ) 300 MG/ML solution 80 mL  80 mL Intravenous Once PRN Young, Travis J, DO       oxyCODONE  (Oxy IR/ROXICODONE ) immediate release tablet 5 mg  5 mg Oral Once Young, Travis J, DO         VITALS:   Blood pressure 126/66, pulse 74, temperature 97.7 F (36.5 C), resp. rate 16, height 5' (1.524 m), weight 90 lb (40.8 kg), SpO2 100%.  Wt Readings from Last 3 Encounters:  04/11/24 90 lb (40.8 kg)  04/11/24 90 lb (40.8 kg)  03/23/24  92 lb 13 oz (42.1 kg)    Body mass index is 17.58 kg/m.    Onc Performance Status - 04/11/24 0933       ECOG Perf Status   ECOG Perf Status Ambulatory and capable of all selfcare but unable to carry out any work activities.  Up and about more than 50% of waking hours      KPS SCALE   KPS % SCORE Requires occasional assistance but is able to care for most needs           PHYSICAL EXAM:   Physical Exam Constitutional:      General: She is in acute distress (from pain).  HENT:     Head: Normocephalic and atraumatic.     Right Ear: Tympanic membrane, ear canal and external ear normal.  Cardiovascular:     Rate and Rhythm: Normal rate.     Heart sounds: Normal heart sounds.  Pulmonary:     Effort: Pulmonary effort is normal. No respiratory distress.     Breath sounds: Normal breath sounds.  Abdominal:     General: There is no distension.  Neurological:     General: No focal deficit present.     Mental Status: She is alert and oriented to person, place, and time.  Psychiatric:        Mood and Affect: Mood normal.        Behavior: Behavior normal.       LABORATORY DATA:   I have reviewed the data as listed.  Results for orders placed or performed in visit on 04/11/24  Magnesium   Result Value Ref Range   Magnesium  1.8 1.7 - 2.4 mg/dL  CMP (Cancer Center only)  Result Value Ref Range   Sodium 137 135 - 145 mmol/L   Potassium 4.1  3.5 - 5.1 mmol/L   Chloride 103 98 - 111 mmol/L   CO2 26 22 - 32 mmol/L   Glucose, Bld 157 (H) 70 - 99 mg/dL   BUN 15 8 - 23 mg/dL   Creatinine 9.08 9.55 - 1.00 mg/dL   Calcium  9.5 8.9 - 10.3 mg/dL   Total Protein 6.9 6.5 - 8.1 g/dL   Albumin 4.1 3.5 - 5.0 g/dL   AST 29 15 - 41 U/L   ALT 24 0 - 44 U/L   Alkaline Phosphatase 69 38 - 126 U/L   Total Bilirubin 0.3 0.0 - 1.2 mg/dL   GFR, Estimated >39 >39 mL/min   Anion gap 8 5 - 15  CBC with Differential (Cancer Center Only)  Result Value Ref Range   WBC Count 4.0 4.0 - 10.5 K/uL   RBC 3.33 (L) 3.87 - 5.11 MIL/uL   Hemoglobin 11.1 (L) 12.0 - 15.0 g/dL   HCT 67.9 (L) 63.9 - 53.9 %   MCV 96.1 80.0 - 100.0 fL   MCH 33.3 26.0 - 34.0 pg   MCHC 34.7 30.0 - 36.0 g/dL   RDW 83.4 (H) 88.4 - 84.4 %   Platelet Count 249 150 - 400 K/uL   nRBC 0.0 0.0 - 0.2 %   Neutrophils Relative % 68 %   Neutro Abs 2.7 1.7 - 7.7 K/uL   Lymphocytes Relative 16 %   Lymphs Abs 0.6 (L) 0.7 - 4.0 K/uL   Monocytes Relative 8 %   Monocytes Absolute 0.3 0.1 - 1.0 K/uL   Eosinophils Relative 6 %   Eosinophils Absolute 0.2 0.0 - 0.5 K/uL   Basophils Relative 1 %   Basophils Absolute  0.1 0.0 - 0.1 K/uL   Immature Granulocytes 1 %   Abs Immature Granulocytes 0.02 0.00 - 0.07 K/uL      RADIOGRAPHIC STUDIES:  I have personally reviewed the radiological images as listed and agree with the findings in the report.  DG ERCP Result Date: 03/27/2024 CLINICAL DATA:  Cholangiocarcinoma.  Stent change. EXAM: ERCP TECHNIQUE: Multiple spot images obtained with the fluoroscopic device and submitted for interpretation post-procedure. FLUOROSCOPY: Radiation Exposure Index (as provided by the fluoroscopic device): 21.81 mGy Kerma COMPARISON:  CT 03/16/2024 FINDINGS: Plastic biliary stent was removed. Cholangiogram demonstrates narrowing and irregularity of the mid common bile duct. A metallic biliary stent was placed in the common bile duct. IMPRESSION: Focal narrowing and  irregularity in the mid common bile duct. Placement of metallic biliary stent. These images were submitted for radiologic interpretation only. Please see the procedural report. Electronically Signed   By: Juliene Balder M.D.   On: 03/27/2024 17:19   MR LUMBAR SPINE W WO CONTRAST Result Date: 03/23/2024 CLINICAL DATA:  History of biliary cancer. Question metastatic disease. Back pain. EXAM: MRI LUMBAR SPINE WITHOUT AND WITH CONTRAST TECHNIQUE: Multiplanar and multiecho pulse sequences of the lumbar spine were obtained without and with intravenous contrast. CONTRAST:  5mL GADAVIST  GADOBUTROL  1 MMOL/ML IV SOLN COMPARISON:  Radiography 11/17/2021 FINDINGS: Segmentation:  5 lumbar type vertebral bodies. Alignment: Degenerative anterolisthesis of 4 mm at L3-4 and 6 mm at L4-5. Vertebrae: No fracture or focal bone lesion. No evidence of regional metastatic disease. Edema and enhancement of the facet arthropathy at L3-4 and L4-5. Conus medullaris and cauda equina: Conus extends to the L1 level. Conus and cauda equina appear normal. Paraspinal and other soft tissues: Negative Disc levels: No significant finding from T11-12 through L1-2. L2-3: Mild disc bulge. Moderate bilateral facet degeneration and hypertrophy. Mild canal narrowing but no neural compression. L3-4: Advanced bilateral facet arthropathy with 4 mm of degenerative anterolisthesis. Broad-based disc herniation. Moderate to severe multifactorial stenosis that could cause neural compression on either or both sides. This is slightly worse on the right. L4-5: Advanced bilateral facet arthropathy with anterolisthesis of 6 mm. Shallow protrusion of the disc. Moderate multifactorial stenosis with potential for neural compression in the lateral recesses. This is more pronounced on the right. Moderate foraminal narrowing on the right as well. L5-S1: No disc abnormality.  Mild facet degeneration.  No stenosis. IMPRESSION: 1. No evidence of metastatic disease to the lumbar  spine. 2. L3-4: Advanced bilateral facet arthropathy with 4 mm of anterolisthesis. Broad-based disc herniation. Moderate to severe multifactorial stenosis that could cause neural compression on either or both sides. This is slightly worse on the right. The facet arthritis could certainly be painful. 3. L4-5: Advanced bilateral facet arthropathy with 6 mm of anterolisthesis. Shallow protrusion of the disc. Moderate multifactorial stenosis with potential for neural compression in the lateral recesses, more pronounced on the right. Moderate foraminal narrowing on the right as well. The facet arthritis could be painful. 4. L2-3: Disc bulge. Moderate bilateral facet degeneration and hypertrophy. Mild canal narrowing but no neural compression. 5. L5-S1: Mild facet degeneration. No stenosis. Electronically Signed   By: Oneil Officer M.D.   On: 03/23/2024 12:54   MR THORACIC SPINE W WO CONTRAST Result Date: 03/23/2024 CLINICAL DATA:  Metastatic disease evaluation. Bile duct cancer. Surgery yesterday. EXAM: MRI THORACIC WITHOUT AND WITH CONTRAST TECHNIQUE: Multiplanar and multiecho pulse sequences of the thoracic spine were obtained without and with intravenous contrast. CONTRAST:  5mL GADAVIST  GADOBUTROL  1  MMOL/ML IV SOLN COMPARISON:  None Available. FINDINGS: Alignment:  No significant malalignment.  Minimal scoliosis. Vertebrae: No fracture or focal bone lesion. No evidence of bony metastatic disease. Cord:  No cord compression or metastatic disease to the cord. Paraspinal and other soft tissues: Tiny pleural effusions layering dependently. Disc levels: No significant disc level finding. No compressive stenosis of the canal or foramina. 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 IMPRESSION: 1. No evidence of metastatic disease to the thoracic spine. 2. Tiny pleural effusions layering dependently. Electronically Signed   By: Oneil Officer M.D.   On: 03/23/2024 12:49   CT  Angio Abdomen W and/or Wo Contrast Result Date: 03/23/2024 CLINICAL DATA:  Aortic aneurysm suspected. Nausea vomiting and diarrhea. Abdominal pain radiating to back. History of bile duct cancer. * Tracking Code: BO * EXAM: CT ANGIOGRAPHY ABDOMEN TECHNIQUE: Multidetector CT imaging of the abdomen was performed using the standard protocol during bolus administration of intravenous contrast. Multiplanar reconstructed images and MIPs were obtained and reviewed to evaluate the vascular anatomy. RADIATION DOSE REDUCTION: This exam was performed according to the departmental dose-optimization program which includes automated exposure control, adjustment of the mA and/or kV according to patient size and/or use of iterative reconstruction technique. CONTRAST:  90mL OMNIPAQUE  IOHEXOL  350 MG/ML SOLN COMPARISON:  CT scan abdomen and pelvis from 03/16/2024. FINDINGS: VASCULAR Aorta: Normal caliber aorta without aneurysm, dissection, vasculitis or significant stenosis. Celiac: Patent without evidence of aneurysm, dissection, vasculitis or significant stenosis. SMA: Patent without evidence of aneurysm, dissection, vasculitis or significant stenosis. 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 All the renal arteries are patent without evidence of aneurysm, dissection, vasculitis, fibromuscular dysplasia or significant stenosis. IMA: Small caliber. Patent without evidence of aneurysm, dissection, vasculitis or significant stenosis. Inflow: Patent without evidence of aneurysm, dissection, vasculitis or significant stenosis. Veins: No obvious venous abnormality within the limitations of this arterial phase study. Review of the MIP images confirms the above findings. NON-VASCULAR Lower chest: There are patchy atelectatic changes in the visualized lung bases. No overt consolidation. No pleural effusion. The heart is normal in size. No pericardial effusion. Hepatobiliary: The liver is normal in size.  Non-cirrhotic configuration. No suspicious mass. Since the prior study, there is exchange of common bile duct stent. There is new wider stent extending from the common hepatic duct into the second part of duodenum. There is resultant pneumobilia mainly in the left hepatic lobe. No intrahepatic bile duct dilation. Very small volume 2-3 mm calcified gallstones noted in the fundus region without imaging signs of acute cholecystitis. Normal gallbladder wall thickness. No pericholecystic inflammatory changes. Pancreas: Unremarkable. No pancreatic ductal dilatation or surrounding inflammatory changes. Spleen: Within normal limits. No focal lesion. Adrenals/Urinary Tract: Adrenal glands are unremarkable. No suspicious renal mass. No nephroureterolithiasis or obstructive uropathy on either side Stomach/Bowel: No disproportionate dilation of the small or large bowel loops. No evidence of abnormal bowel wall thickening or inflammatory changes. The appendix is unremarkable. Vascular/Lymphatic: No ascites or pneumoperitoneum. No abdominal or pelvic lymphadenopathy, by size criteria. No aneurysmal dilation of the major abdominal arteries. There are mild peripheral atherosclerotic vascular calcifications of the aorta and its major branches. Other: There is a tiny fat containing umbilical hernia. The soft tissues and abdominal wall are otherwise unremarkable. Musculoskeletal: No suspicious osseous lesions. There are mild multilevel degenerative changes in the visualized spine. IMPRESSION: 1. No acute inflammatory process identified within the abdomen or pelvis. 2. No aortic aneurysm,  dissection or periaortic fat stranding. 3. Multiple other nonacute observations, as described above. Aortic Atherosclerosis (ICD10-I70.0). Electronically Signed   By: Ree Molt M.D.   On: 03/23/2024 10:26   CT ABDOMEN PELVIS W CONTRAST Result Date: 03/16/2024 CLINICAL DATA:  Nausea vomiting diarrhea history of bile duct cancer EXAM: CT ABDOMEN  AND PELVIS WITH CONTRAST TECHNIQUE: Multidetector CT imaging of the abdomen and pelvis was performed using the standard protocol following bolus administration of intravenous contrast. RADIATION DOSE REDUCTION: This exam was performed according to the departmental dose-optimization program which includes automated exposure control, adjustment of the mA and/or kV according to patient size and/or use of iterative reconstruction technique. CONTRAST:  OMNIPAQUE  IOHEXOL  300 MG/ML  SOLN COMPARISON:  CT 12/29/2023, MRI 12/18/2023 FINDINGS: Lower chest: Lung bases demonstrate no acute airspace disease. Hepatobiliary: Hepatic steatosis. Pneumobilia. Common bile duct stent remains in place. No biliary distension. Decompressed gallbladder with small stone at the fundus. Indistinct hazy density surrounding the bile duct stent at the porta hepatis. Narrowed appearance of the portal vein at the porta hepatis by indistinct surrounding soft tissue, series 2, image 24, coronal series 8, image 41, and presumably related to the known history of bowel duct malignancy. Pancreas: No ductal dilatation.  No definite inflammatory change. Spleen: Normal in size without focal abnormality. Adrenals/Urinary Tract: Adrenal glands are normal. Kidneys show no hydronephrosis. The bladder is unremarkable Stomach/Bowel: The stomach is decompressed. No dilated small bowel. Areas of wall thickening and pericolonic stranding, involves the cecum, ascending colon, hepatic flexure and transverse colon. Similar changes at the sigmoid colon. Negative appendix. Diverticular disease. Some fluid-filled small bowel in the right lower quadrant, mucosal enhancement of the terminal ileum and pelvic small bowel loops. Vascular/Lymphatic: Aortic atherosclerosis. No aneurysm. Small porta hepatis lymph nodes measuring up to 8 mm. As stated previously, focal narrowed appearance of the portal vein but without thrombus or occlusion. Reproductive: Status post  hysterectomy. No adnexal masses. Other: Negative for pelvic effusion or free air. Musculoskeletal: No acute or suspicious osseous abnormality. IMPRESSION: IMPRESSION 1. Colon wall thickening and pericolonic edema extends from the cecum through the transverse colon and also involving the sigmoid colon consistent with colitis of infectious, inflammatory or ischemic etiology. Fluid-filled small bowel in the right lower quadrant with mild mucosal enhancement of terminal ileum consistent with ileitis. 2. Common bile duct stent remains in place with pneumobilia. No biliary distension. Indistinct hazy soft tissue density at the porta hepatis, surrounding common bile duct stent and now with interval focal narrowed appearance of the portal vein, and presumably elated to the history of known bile duct malignancy. No evidence for portal vein thrombus or occlusion on this exam. 3. Hepatic steatosis. 4. Aortic atherosclerosis. Aortic Atherosclerosis (ICD10-I70.0). Electronically Signed   By: Luke Bun M.D.   On: 03/16/2024 22:01      CODE STATUS:  Code Status History     Date Active Date Inactive Code Status Order ID Comments User Context   12/18/2023 1558 12/23/2023 2029 Full Code [id number]  Georgina Basket, MD ED   09/12/2023 2330 09/13/2023 1728 Limited: Do not attempt resuscitation (DNR) -DNR-LIMITED -Do Not Intubate/DNI  [id number]  Lou Claretta HERO, MD ED   11/24/2013 1103 11/26/2013 2046 Full Code [id number]  Katrinka Garnette KIDD, MD Inpatient    Questions for Most Recent Historical Code Status (Order [id number])     Question Answer   By: Consent: discussion documented in EHR  Advance Directive Documentation    Flowsheet Row Most Recent Value  Type of Advance Directive Healthcare Power of Attorney, Living will  Pre-existing out of facility DNR order (yellow form or pink MOST form) --  MOST Form in Place? --    No orders of the defined types were placed in this encounter.     Future Appointments  Date Time Provider Department Center  04/17/2024 10:40 AM [name], Roxan BROCKS, NP [facility] None  04/23/2024  2:00 PM [facility] None    This document was completed utilizing speech recognition software. Grammatical errors, random word insertions, pronoun errors, and incomplete sentences are an occasional consequence of this system due to software limitations, ambient noise, and hardware issues. Any formal questions or concerns about the content, text or information contained within the body of this dictation should be directly addressed to the provider for clarification.

## 2024-04-11 NOTE — Telephone Encounter (Signed)
 Opened in error

## 2024-04-11 NOTE — ED Provider Notes (Signed)
 [facility] AT [facility] Provider Note   CSN: [id number] Arrival date & time: 04/11/24  1053     Patient presents with: Pain, Generalized   Caroline Snyder is a 83 y.o. female.   This is a 83 year old female presenting emergency department for evaluation of pain.  History of cancer, coming from cancer visit for evaluation.  Poor historian, but is at baseline, daughter helping with history and notes that mother has had waxing and waning pain for the past month since stent was placed.  Hard to localize but seemingly complaining of pain to her epigastrium right upper quadrant into thoracic back.  Was prescribed oxycodone , but family is hesitant to give it to her as she does get more confused and she lives alone.  No nausea no vomiting.        Prior to Admission medications   Medication Sig Start Date End Date Taking? Authorizing Provider  lidocaine  (LIDODERM ) 5 % Place 1 patch onto the skin daily. Remove & Discard patch within 12 hours or as directed by MD 04/11/24  Yes Neysa Caron PARAS, DO  acetaminophen  (TYLENOL ) 500 MG tablet Take 500-1,000 mg by mouth every 8 (eight) hours as needed (for pain).    [provider]  aspirin  EC 81 MG tablet Take 1 tablet (81 mg total) by mouth daily. Swallow whole. 10/31/20   Eubanks, Jessica K, NP  Ensure (ENSURE) Take 237 mLs by mouth 2 (two) times daily between meals.    [provider]  LORazepam  (ATIVAN ) 0.5 MG tablet Take 0.5 tablets (0.25 mg total) by mouth every 6 (six) hours as needed (nausea not relieved by Zofran  (Ondansetron )). 03/06/24   Lanell Donald Stagger, PA-C  losartan  (COZAAR ) 50 MG tablet Take 1 tablet (50 mg total) by mouth daily. 03/28/24   Krishnan, Gokul, MD  ondansetron  (ZOFRAN ) 8 MG tablet Take 1 tablet (8 mg total) by mouth every 8 (eight) hours as needed for nausea or vomiting. 03/06/24   Lanell Donald Stagger, PA-C  oxyCODONE  (OXY IR/ROXICODONE ) 5 MG immediate release tablet  Take 1 tablet (5 mg total) by mouth every 6 (six) hours as needed for severe pain (pain score 7-10). 04/11/24   Pasam, Chinita, MD  pantoprazole  (PROTONIX ) 40 MG tablet Take 40 mg by mouth daily as needed (for stomach acid).    [provider]  prochlorperazine  (COMPAZINE ) 10 MG tablet Take 0.5 tablets (5 mg total) by mouth every 6 (six) hours as needed for refractory nausea / vomiting. 03/22/24   Cindy Garnette POUR, MD  rosuvastatin  (CRESTOR ) 20 MG tablet Take 0.5 tablets (10 mg total) by mouth daily. Patient taking differently: Take 10 mg by mouth at bedtime. 12/23/23   Briana Elgin LABOR, MD  saccharomyces boulardii (FLORASTOR) 250 MG capsule Take 1 capsule (250 mg total) by mouth 2 (two) times daily. 03/28/24   Krishnan, Gokul, MD    Allergies: Aricept  [donepezil ] and Vioxx [rofecoxib]    Review of Systems  Updated Vital Signs BP 125/63   Pulse (!) 55   Temp 97.6 F (36.4 C)   Resp 16   Ht 5' (1.524 m)   Wt 40.8 kg   SpO2 100%   BMI 17.58 kg/m   Physical Exam Vitals and nursing note reviewed.  Constitutional:      General: She is not in acute distress.    Appearance: She is not toxic-appearing.  HENT:     Head: Normocephalic.     Nose: Nose normal.  Mouth/Throat:     Mouth: Mucous membranes are moist.  Eyes:     Conjunctiva/sclera: Conjunctivae normal.  Cardiovascular:     Rate and Rhythm: Normal rate.  Pulmonary:     Effort: Pulmonary effort is normal.     Breath sounds: Normal breath sounds.  Abdominal:     General: Abdomen is flat. There is no distension.     Tenderness: There is abdominal tenderness. There is no guarding or rebound.  Skin:    General: Skin is warm and dry.     Capillary Refill: Capillary refill takes less than 2 seconds.  Neurological:     Mental Status: She is alert. Mental status is at baseline.  Psychiatric:        Mood and Affect: Mood normal.        Behavior: Behavior normal.     (all labs ordered are listed, but only abnormal  results are displayed) Labs Reviewed  URINALYSIS, ROUTINE W REFLEX MICROSCOPIC - Abnormal; Notable for the following components:      Result Value   Color, Urine STRAW (*)    Specific Gravity, Urine >1.046 (*)    All other components within normal limits  LIPASE, BLOOD  TROPONIN T, HIGH SENSITIVITY  TROPONIN T, HIGH SENSITIVITY    EKG: None  Radiology: CT L-SPINE NO CHARGE Result Date: 04/11/2024 CLINICAL DATA:  Generalized pain, bile duct cancer EXAM: CT LUMBAR SPINE WITHOUT CONTRAST TECHNIQUE: Multidetector CT imaging of the lumbar spine was performed without intravenous contrast administration. Multiplanar CT image reconstructions were also generated. RADIATION DOSE REDUCTION: This exam was performed according to the departmental dose-optimization program which includes automated exposure control, adjustment of the mA and/or kV according to patient size and/or use of iterative reconstruction technique. COMPARISON:  CT abdomen 04/11/2024; MRI lumbar spine 03/23/2024 FINDINGS: Segmentation: The lowest lumbar type non-rib-bearing vertebra is labeled as L5. Alignment: 4 mm degenerative anterolisthesis at L4-5 and 3 mm degenerative anterolisthesis at L3-4. Vertebrae: No findings of osseous metastatic disease in the lumbar spine. No pars defects noted. Paraspinal and other soft tissues: Please see dedicated CT abdomen report; no additional significant paraspinal findings. Disc levels: T12-L1: Unremarkable L1-2: Unremarkable L2-3: No impingement. Degenerative facet arthropathy and mild disc bulge. L3-4: Prominent central stenosis due to disc uncovering and disc bulge. Degenerative facet arthropathy and ligamentum flavum redundancy noted. L4-5: Moderate to prominent central stenosis due to disc uncovering, disc bulge, facet arthropathy, and ligamentum flavum redundancy. L5-S1: No impingement.  Degenerative facet arthropathy. IMPRESSION: 1. Lumbar spondylosis and degenerative disc disease, causing  impingement at L3-4 and L4-5. 2. No findings of osseous metastatic disease in the lumbar spine. Electronically Signed   By: Ryan Salvage M.D.   On: 04/11/2024 13:52   CT T-SPINE NO CHARGE Result Date: 04/11/2024 CLINICAL DATA:  Bile duct cancer EXAM: CT Thoracic Spine without contrast TECHNIQUE: Multiplanar CT images of the thoracic spine were reconstructed from contemporary CT of the Chest. RADIATION DOSE REDUCTION: This exam was performed according to the departmental dose-optimization program which includes automated exposure control, adjustment of the mA and/or kV according to patient size and/or use of iterative reconstruction technique. CONTRAST:  None or No additional COMPARISON:  Chest CT 03/23/2024 and 04/11/2024 FINDINGS: Alignment: 3 mm grade 1 degenerative anterolisthesis at T2-3. Vertebrae: Mild anterior interbody spurring at all levels between T2 and T9. No compelling findings of osseous metastatic disease in the thoracic spine. Paraspinal and other soft tissues: Please see dedicated CT chest report; otherwise no additional paraspinal findings. Disc  levels: No thoracic spine impingement identified. IMPRESSION: 1. No compelling findings of osseous metastatic disease in the thoracic spine. 2. 3 mm grade 1 degenerative anterolisthesis at T2-3. 3. Mild anterior interbody spurring at all levels between T2 and T9. Electronically Signed   By: Ryan Salvage M.D.   On: 04/11/2024 13:49   CT CHEST ABDOMEN PELVIS W CONTRAST Result Date: 04/11/2024 CLINICAL DATA:  Common bile duct adenocarcinoma restaging. Generalized pain. * Tracking Code: BO * EXAM: CT CHEST, ABDOMEN, AND PELVIS WITH CONTRAST TECHNIQUE: Multidetector CT imaging of the chest, abdomen and pelvis was performed following the standard protocol during bolus administration of intravenous contrast. RADIATION DOSE REDUCTION: This exam was performed according to the departmental dose-optimization program which includes automated exposure  control, adjustment of the mA and/or kV according to patient size and/or use of iterative reconstruction technique. CONTRAST:  80mL OMNIPAQUE  IOHEXOL  300 MG/ML  SOLN COMPARISON:  Multiple exams, including 12/22/2023 and 03/23/2024 FINDINGS: CT CHEST FINDINGS Cardiovascular: Coronary, aortic arch, and branch vessel atherosclerotic vascular disease. Mediastinum/Nodes: Unremarkable Lungs/Pleura: Biapical pleuroparenchymal scarring. Mild scarring medially in the right middle lobe and lingula. Minimal airway plugging noted in the left upper lobe. Musculoskeletal: Mild thoracic spondylosis. CT ABDOMEN PELVIS FINDINGS Hepatobiliary: Pneumobilia with biliary stent traversing the hypoenhancing lesion surrounding the common bile duct and extending towards the pancreatic head. The hypoenhancing rind surrounding the biliary stent measures 2.0 by 2.1 cm on image 68 series 2. There is narrowing of the portal vein on image 78 series 8 worsened from 03/23/2024 with hypoenhancing tissue suspicious for tumor extending especially along the right side of the portal vein in this vicinity, but potentially extending circumferentially around a focal portion of the portal vein in the area of narrowing. The stent currently appears patent. Gallbladder collapsed/empty potentially with a tiny gallstone. I do not see compelling findings of metastatic disease to the hepatic parenchyma. Pancreas: The hypoenhancing mass along the common bile duct abuts the pancreatic head without visibly intact fat plane between the mass and the pancreas. Spleen: Unremarkable Adrenals/Urinary Tract: Unremarkable Stomach/Bowel: Prominent stool throughout the colon favors constipation. Vascular/Lymphatic: Atherosclerosis is present, including aortoiliac atherosclerotic disease. Narrowing of the portal vein due to the tumor along the porta hepatis. Atheromatous plaque dorsally at the origins of the celiac trunk and SMA. Reproductive: Uterus absent. Other: No supplemental  non-categorized findings. Musculoskeletal: Grade 1 anterolisthesis at L3-4 and L4-5. IMPRESSION: 1. The hypoenhancing mass along the common bile duct abuts the pancreatic head without visibly intact fat plane between the mass and the pancreas. The mass causes increase narrowing of the portal vein and may extend circumferentially around the portal vein in the area of narrowing. The biliary stent remains patent. 2. No findings of distant metastatic disease. 3. Prominent stool throughout the colon favors constipation. 4.  Aortic Atherosclerosis (ICD10-I70.0). Electronically Signed   By: Ryan Salvage M.D.   On: 04/11/2024 13:46   DG Chest 2 View Result Date: 04/11/2024 CLINICAL DATA:  Chest pain. EXAM: CHEST - 2 VIEW COMPARISON:  January 29, 2017. FINDINGS: The heart size and mediastinal contours are within normal limits. Both lungs are clear. The visualized skeletal structures are unremarkable. IMPRESSION: No active cardiopulmonary disease. Electronically Signed   By: Lynwood Landy Raddle M.D.   On: 04/11/2024 13:01   US  Abdomen Limited RUQ (LIVER/GB) Result Date: 04/11/2024 CLINICAL DATA:  Right upper quadrant abdominal pain. EXAM: ULTRASOUND ABDOMEN LIMITED RIGHT UPPER QUADRANT COMPARISON:  March 23, 2024. FINDINGS: Gallbladder: No cholelithiasis is noted. Mild gallbladder wall thickening  is noted at approximately 3 mm. Positive sonographic Murphy's sign is noted. Common bile duct: Diameter: 3 mm which is within normal limits. Liver: No focal lesion identified. Within normal limits in parenchymal echogenicity. Portal vein is patent on color Doppler imaging with normal direction of blood flow towards the liver. Other: None. IMPRESSION: Mild gallbladder wall thickening is noted with positive sonographic Murphy's sign. No cholelithiasis is noted. If there is clinical concern for acalculous cholecystitis, HIDA scan may be performed for further evaluation. Electronically Signed   By: Lynwood Landy Raddle M.D.   On:  04/11/2024 12:22     Procedures   Medications Ordered in the ED  oxyCODONE  (Oxy IR/ROXICODONE ) immediate release tablet 5 mg (5 mg Oral Given 04/11/24 1212)  iohexol  (OMNIPAQUE ) 300 MG/ML solution 80 mL (80 mLs Intravenous Contrast Given 04/11/24 1254)    Clinical Course as of 04/11/24 1611  Wed Apr 11, 2024  1101 Per my chart review saw oncology today.  Recent diagnosed with gallbladder cancer has seen surgery, Dr. Dasie and Dr. Dewey with radiation oncology.  Currently undergoing radiation chemotherapy.  Did have a biliary stent placed. And per oncology note today manages her pain with Tylenol  only. [TY]  1113 On review from labs collected at cancer center today.  No metabolic derangements.  Glucose mildly elevated ~150, but does not appear to be in DKA.  Normal BUN/creatinine.  No transaminitis or elevated bilirubin.  Her magnesium  level was normal. no leukocytosis.  Hemoglobin of 11.1 which is improved from her baseline of 9. [TY]  1242 US  Abdomen Limited RUQ (LIVER/GB) IMPRESSION: Mild gallbladder wall thickening is noted with positive sonographic Murphy's sign. No cholelithiasis is noted. If there is clinical concern for acalculous cholecystitis, HIDA scan may be performed for further evaluation.   Electronically Signed   By: Lynwood Landy Raddle M.D.   On: 04/11/2024 12:22   [TY]  1408 CT CHEST ABDOMEN PELVIS W CONTRAST IMPRESSION: 1. The hypoenhancing mass along the common bile duct abuts the pancreatic head without visibly intact fat plane between the mass and the pancreas. The mass causes increase narrowing of the portal vein and may extend circumferentially around the portal vein in the area of narrowing. The biliary stent remains patent. 2. No findings of distant metastatic disease. 3. Prominent stool throughout the colon favors constipation. 4.  Aortic Atherosclerosis (ICD10-I70.0).   Electronically Signed   By: Ryan Salvage M.D.   On: 04/11/2024 13:46   [TY]   1408 CT T-SPINE NO CHARGE IMPRESSION: 1. No compelling findings of osseous metastatic disease in the thoracic spine. 2. 3 mm grade 1 degenerative anterolisthesis at T2-3. 3. Mild anterior interbody spurring at all levels between T2 and T9.   Electronically Signed   By: Ryan Salvage M.D.   On: 04/11/2024 13:49   [TY]  1408 CT L-SPINE NO CHARGE MPRESSION: 1. Lumbar spondylosis and degenerative disc disease, causing impingement at L3-4 and L4-5. 2. No findings of osseous metastatic disease in the lumbar spine.   Electronically Signed   By: Ryan Salvage M.D.   On: 04/11/2024 13:52   [TY]  1607 Patient reevaluated is feeling improvement after taking her home Roxicodone .  Workup today largely reassuring.  Troponin negative x 2.  EKG without ST segment changes on my independent review.  ACS unlikely.  Lipase is normal.  Acute pancreatitis unlikely.  UA without evidence of urinary tract infection.  CT chest abdomen pelvis with what appears to be stable appearance of biliary stent, does  show significant constipation.  No metastatic process to her thoracic or lumbar spine.  Ultrasound without acute cholecystitis.  Given patient's reassuring imaging and workup will discharge in stable condition to follow-up PCP.  Will trial lidocaine  patches as daughter is looking for nonnarcotic alternatives for her thoracic back pain.  Did discuss MiraLAX for constipation and PCP follow-up. 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  Does appear uncomfortable on exam.  Has not had her home Roxicodone  since last night.  Soft abdomen, but some tenderness to right upper quadrant.  Reviewed labs collected at her oncology visit today; see  ED course.  Will expand workup.  Given worsening symptoms will also get repeat imaging.  See ED course for further MDM disposition.  Amount and/or Complexity of Data Reviewed Independent Historian:     Details: Daughter provided most of HPI External Data Reviewed:     Details: See ED course Labs: ordered. Decision-making details documented in ED Course. Radiology: ordered and independent interpretation performed. Decision-making details documented in ED Course.    Details: Do not appreciate obvious free air on CT scan ECG/medicine tests: ordered and independent interpretation performed.    Details: No STEMI  Risk Prescription drug management. Decision regarding hospitalization. Diagnosis or treatment significantly limited by social determinants of health.      Final diagnoses:  Cancer associated pain  Constipation, unspecified constipation type    ED Discharge Orders          Ordered    lidocaine  (LIDODERM ) 5 %  Every 24 hours        04/11/24 1609               Neysa Caron PARAS, DO 04/11/24 1611

## 2024-04-11 NOTE — Assessment & Plan Note (Addendum)
 Severe pain under the shoulder blade, possibly related to muscle inflammation versus referred pain from biliary stent.  Pain management is crucial to improve quality of life.  - Originally we were planning to manage this outpatient.  She does take oxycodone  at home.  We recommended Aleve in the morning with adequate hydration and heat pad alternating with cold pack.  Oxycodone  refilled.  - Patient continued to have worsening pain while she was in the clinic and started hunching over and hence she had to be directed to the ED for further evaluation (including imaging as needed) and management of intractable pain

## 2024-04-11 NOTE — ED Notes (Signed)
 [place] in room

## 2024-04-16 ENCOUNTER — Encounter (HOSPITAL_COMMUNITY): Admission: RE | Payer: Self-pay | Source: Home / Self Care

## 2024-04-16 ENCOUNTER — Ambulatory Visit (HOSPITAL_COMMUNITY)
Admission: RE | Admit: 2024-04-16 | Payer: Medicare (Managed Care) | Source: Home / Self Care | Admitting: Gastroenterology

## 2024-04-16 SURGERY — ERCP, WITH INTERVENTION IF INDICATED
Anesthesia: General

## 2024-04-17 ENCOUNTER — Encounter: Payer: Self-pay | Admitting: Family

## 2024-04-17 ENCOUNTER — Ambulatory Visit (INDEPENDENT_AMBULATORY_CARE_PROVIDER_SITE_OTHER): Payer: Medicare (Managed Care) | Admitting: Family

## 2024-04-17 VITALS — BP 124/60 | HR 77 | Temp 97.6°F | Resp 16 | Ht 60.0 in | Wt 88.8 lb

## 2024-04-17 DIAGNOSIS — I10 Essential (primary) hypertension: Secondary | ICD-10-CM

## 2024-04-17 DIAGNOSIS — K219 Gastro-esophageal reflux disease without esophagitis: Secondary | ICD-10-CM

## 2024-04-17 DIAGNOSIS — E782 Mixed hyperlipidemia: Secondary | ICD-10-CM | POA: Diagnosis not present

## 2024-04-17 DIAGNOSIS — M810 Age-related osteoporosis without current pathological fracture: Secondary | ICD-10-CM | POA: Diagnosis not present

## 2024-04-17 DIAGNOSIS — R110 Nausea: Secondary | ICD-10-CM | POA: Diagnosis not present

## 2024-04-17 DIAGNOSIS — K5901 Slow transit constipation: Secondary | ICD-10-CM | POA: Diagnosis not present

## 2024-04-17 DIAGNOSIS — M549 Dorsalgia, unspecified: Secondary | ICD-10-CM | POA: Diagnosis not present

## 2024-04-17 DIAGNOSIS — R634 Abnormal weight loss: Secondary | ICD-10-CM

## 2024-04-17 MED ORDER — OXYCODONE HCL 5 MG PO TABS: 5.0000 mg | ORAL_TABLET | Freq: Two times a day (BID) | ORAL | 0 refills | Status: DC | PRN

## 2024-04-17 NOTE — Progress Notes (Signed)
 Provider: Quierra Silverio FNP-C  [name], NP  Patient Care Team: [name], NP as PCP - General (Geriatric Medicine) Cleatus Collar, MD as Consulting Physician (Ophthalmology) Camillo Golas, MD as Consulting Physician (Ophthalmology) Winfred Curlee DEL, MD (Inactive) as Consulting Physician (Gynecology) Ardis, Evalene CROME, RN as Oncology Nurse Navigator  Extended Emergency Contact Information Primary Emergency Contact: McLamb,Randall  [place] Home Phone: [phone] Relation: Son Secondary Emergency Contact: [facility] Home Phone: [phone] Relation: Other  Code Status:  Full Code  Goals of care: Advanced Directive information    04/17/2024   10:37 AM  Advanced Directives  Does Patient Have a Medical Advance Directive? Yes  Type of Estate agent of [place];Living will  Does patient want to make changes to medical advance directive? No - Patient declined  Copy of Healthcare Power of Attorney in Chart? Yes - validated most recent copy scanned in chart (See row information)     Chief Complaint  Patient presents with   Hospitalization Follow-up    Discussed the use of AI scribe software for clinical note transcription with the patient, who gave verbal consent to proceed.  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 Here with son who provides HPI information.  She has been experiencing persistent back pain located on the right side of her spine, between the shoulder blade and lower back, for the past three months. The pain is described as waxing and waning. She has been using Tylenol  for pain management and was previously prescribed Oxycodone , which she is trying to avoid due to its constipating effects. She has also tried a pain patch once and plans to use it more  regularly.  She has a history of bile duct cancer, located between the liver and pancreas, and has completed radiation and chemotherapy treatments. She continues to experience significant back pain.  She has experienced constipation on three different occasions over the past three months, with her bowels being 'stopped up'. She has been using Miralax for the past two weeks, which has improved her bowel movements. She had a bowel movement this morning, which was not difficult. She has also experienced diarrhea recently.  Her current medications include aspirin , Prolia  every six months, rosuvastatin  for cholesterol, and Losartan  50 mg for blood pressure. She has discontinued lorazepam  for anxiety and Zofran  for nausea, which she used for three months but has since stopped. She uses Protonix  as needed for acid reflux and continues with Miralax and probiotics for bowel management.  She reports occasional acid reflux symptoms, described as 'spit up' that tries to come up, which she attributes to bowel issues. She has been taking Protonix  for this.  She has lost weight, dropping from 97 pounds to 88.8 pounds.    Past Medical History:  Diagnosis Date   Abnormal CT of the chest 11/24/2013   See CT chest 11/22/13  1. No CT evidence of pulmonary arterial embolic disease.   2. Enlarged lymph nodes in the right hilar and subcarinal region.   These may represent reactive lymph nodes, clinical correlation   recommended. There is otherwise no evidence of mediastinal or   parenchymal masses or nodules nor infiltrates.   3. 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     Depression 05/05/2007   External hemorrhoids without mention of complication 03/24/1999   GERD (gastroesophageal reflux disease) 08/02/1998   Hyperlipidemia 06/29/2006   Hypertensive emergency 09/13/2023   Memory loss 04/15/2003   Migraine without aura, without mention of intractable migraine without mention of status migrainosus 03/24/1999   Osteoporosis, unspecified 04/15/2004   Tear film insufficiency, unspecified 03/24/1999   Unspecified essential hypertension 11/22/2007   Past Surgical History:  Procedure Laterality Date   COLONOSCOPY  06-16-2007   Internal hemorrhoids,laxity of anal sphincter,polyp at 25cm form anal verge, tortuous sigmoid colon. Dr.Orr    ERCP N/A 12/21/2023   Procedure: ERCP, WITH INTERVENTION IF INDICATED;  Surgeon: Wilhelmenia Aloha Raddle., MD;  Location: [facility] ENDOSCOPY;  Service: Gastroenterology;  Laterality: N/A;   ERCP N/A 03/19/2024   Procedure: ERCP, WITH INTERVENTION IF INDICATED;  Surgeon: Wilhelmenia Aloha Raddle., MD;  Location: [facility] ENDOSCOPY;  Service: Gastroenterology;  Laterality: N/A;   ESOPHAGOGASTRODUODENOSCOPY N/A 12/21/2023   Procedure: EGD (ESOPHAGOGASTRODUODENOSCOPY);  Surgeon: Wilhelmenia Aloha Raddle., MD;  Location: [facility] ENDOSCOPY;  Service: Gastroenterology;  Laterality: N/A;   EUS N/A 12/21/2023   Procedure: ULTRASOUND, UPPER GI TRACT, ENDOSCOPIC;  Surgeon: Wilhelmenia Aloha Raddle., MD;  Location: [facility] ENDOSCOPY;  Service: Gastroenterology;  Laterality: N/A;   EYE SURGERY Bilateral 2010   cataract Dr. Camillo   FINE NEEDLE ASPIRATION  12/21/2023   Procedure: FINE NEEDLE ASPIRATION;  Surgeon: Wilhelmenia Aloha Raddle., MD;  Location: [facility] ENDOSCOPY;  Service: Gastroenterology;;   VAGINAL HYSTERECTOMY  1980    Allergies  Allergen Reactions   Aricept  [Donepezil ] Diarrhea   Vioxx [Rofecoxib] Nausea Only    Pt. Can't remember what reaction was but asks to leave on her profile.     Outpatient Encounter Medications as of  04/17/2024  Medication Sig   acetaminophen  (TYLENOL ) 500 MG tablet Take 500-1,000 mg by mouth every 8 (eight) hours as needed (for pain).   aspirin  EC 81 MG tablet Take 1 tablet (81 mg total) by mouth daily. Swallow whole.   Ensure (ENSURE) Take 237 mLs by mouth 2 (two) times daily between meals.   lidocaine  (LIDODERM ) 5 % Place 1 patch onto the skin daily. Remove & Discard patch within 12 hours or as directed by MD   losartan  (COZAAR ) 50 MG tablet Take 1 tablet (50 mg total) by mouth daily.   Multiple Vitamins-Minerals (ONE-A-DAY WOMENS VITACRAVES PO) Take by mouth daily.   ondansetron  (ZOFRAN ) 8 MG tablet Take 1 tablet (8 mg total) by mouth every 8 (eight) hours as needed for nausea or vomiting.   pantoprazole  (PROTONIX ) 40 MG tablet Take 40 mg by mouth daily as needed (for stomach acid).   polyethylene glycol (MIRALAX / GLYCOLAX) 17 g packet Take 17 g by mouth every other day.   Probiotic Product (PROBIOTIC DAILY PO) Take by mouth daily.   prochlorperazine  (COMPAZINE ) 10 MG tablet Take 0.5 tablets (5 mg total) by mouth every 6 (six) hours as needed for refractory nausea / vomiting.   rosuvastatin  (CRESTOR ) 20 MG tablet Take 0.5 tablets (10 mg total) by mouth daily.   saccharomyces boulardii (FLORASTOR) 250 MG capsule Take 1 capsule (250 mg total) by mouth 2 (two) times daily.   [  DISCONTINUED] Multiple Vitamins-Minerals (ONE-A-DAY VITACRAVES PO) Take by mouth.   oxyCODONE  (OXY IR/ROXICODONE ) 5 MG immediate release tablet Take 1 tablet (5 mg total) by mouth every 12 (twelve) hours as needed for severe pain (pain score 7-10).   [DISCONTINUED] LORazepam  (ATIVAN ) 0.5 MG tablet Take 0.5 tablets (0.25 mg total) by mouth every 6 (six) hours as needed (nausea not relieved by Zofran  (Ondansetron )). (Patient not taking: Reported on 04/17/2024)   [DISCONTINUED] oxyCODONE  (OXY IR/ROXICODONE ) 5 MG immediate release tablet Take 1 tablet (5 mg total) by mouth every 6 (six) hours as needed for severe pain (pain  score 7-10). (Patient not taking: Reported on 04/17/2024)   Facility-Administered Encounter Medications as of 04/17/2024  Medication   denosumab  (PROLIA ) injection 60 mg    Review of Systems  Constitutional:  Negative for appetite change, chills, fatigue, fever and unexpected weight change.  HENT:  Negative for congestion, dental problem, ear discharge, ear pain, facial swelling, hearing loss, nosebleeds, postnasal drip, rhinorrhea, sinus pressure, sinus pain, sneezing, sore throat, tinnitus and trouble swallowing.   Eyes:  Negative for pain, discharge, redness, itching and visual disturbance.  Respiratory:  Negative for cough, chest tightness, shortness of breath and wheezing.   Cardiovascular:  Negative for chest pain, palpitations and leg swelling.  Gastrointestinal:  Positive for constipation. Negative for abdominal distention, abdominal pain, blood in stool, diarrhea, nausea and vomiting.  Genitourinary:  Negative for difficulty urinating, dysuria, flank pain, frequency and urgency.  Musculoskeletal:  Positive for arthralgias and back pain. Negative for gait problem, joint swelling, myalgias, neck pain and neck stiffness.       Right side upper back pain   Skin:  Negative for color change, pallor and rash.  Neurological:  Negative for dizziness, syncope, speech difficulty, weakness, light-headedness, numbness and headaches.  Psychiatric/Behavioral:  Negative for agitation, behavioral problems, confusion, hallucinations and sleep disturbance. The patient is not nervous/anxious.     Immunization History  Administered Date(s) Administered   Fluad Quad(high Dose 65+) 04/26/2019, 04/21/2020   INFLUENZA, HIGH DOSE SEASONAL PF 03/11/2017, 06/09/2018, 03/19/2023   Influenza,inj,Quad PF,6+ Mos 07/19/2013, 05/28/2016, 06/02/2017   Influenza,inj,quad, With Preservative 06/08/2021   Influenza-Unspecified 05/23/2015   Moderna Covid-19 Fall Seasonal Vaccine 79yrs & older 06/29/2022   PFIZER(Purple  Top)SARS-COV-2 Vaccination 08/22/2019, 09/12/2019, 05/29/2020   PNEUMOCOCCAL CONJUGATE-20 09/16/2022   PPD Test 11/30/2013, 05/30/2015   Pfizer Covid-19 Vaccine Bivalent Booster 11yrs & up 06/08/2021   Pneumococcal Conjugate-13 12/24/2014, 05/28/2016   Pneumococcal Polysaccharide-23 06/09/2018   Tdap 03/11/2017, 11/17/2018   Zoster Recombinant(Shingrix) 10/05/2022   Zoster, Live 11/19/2009   Pertinent  Health Maintenance Due  Topic Date Due   Influenza Vaccine  03/02/2024   DEXA SCAN  Completed   Mammogram  Discontinued   Colonoscopy  Discontinued      10/01/2022    2:33 PM 04/08/2023    1:05 PM 04/22/2023   11:51 AM 12/13/2023    9:44 AM 04/17/2024   10:36 AM  Fall Risk  Falls in the past year? 0 0 0 0 0  Was there an injury with Fall? 0 0 0 0 0  Fall Risk Category Calculator 0 0 0 0 0  Patient at Risk for Falls Due to No Fall Risks No Fall Risks No Fall Risks No Fall Risks No Fall Risks  Fall risk Follow up Falls evaluation completed Falls evaluation completed Falls evaluation completed Falls prevention discussed;Falls evaluation completed Falls evaluation completed   Functional Status Survey:    Vitals:   04/17/24 1049  BP:  124/60  Pulse: 77  Resp: 16  Temp: 97.6 F (36.4 C)  SpO2: 97%  Weight: 88 lb 12.8 oz (40.3 kg)  Height: 5' (1.524 m)   Body mass index is 17.34 kg/m. Physical Exam Physical Exam   VITALS: T- 97.6, P- 77, BP- 124/60 MEASUREMENTS: Weight- 88.8 lbs. GENERAL: Alert, cooperative, well developed, no acute distress. HEENT: Normocephalic, normal oropharynx, moist mucous membranes. CHEST: Clear to auscultation bilaterally, no wheezes, rhonchi, or crackles. CARDIOVASCULAR: Normal heart rate and rhythm, S1 and S2 normal without murmurs. ABDOMEN: Soft, non-tender, non-distended, without organomegaly, normal bowel sounds. EXTREMITIES: No cyanosis or edema. NEUROLOGICAL: Cranial nerves grossly intact, moves all extremities without gross motor or sensory  deficit.   Labs reviewed: Recent Labs    03/26/24 0711 03/27/24 0556 03/28/24 0725 04/11/24 0901  NA 137 139 139 137  K 4.0 3.5 4.0 4.1  CL 109 107 107 103  CO2 23 23 21* 26  GLUCOSE 117* 116* 109* 157*  BUN 11 12 10 15   CREATININE 0.66 0.63 0.61 0.91  CALCIUM  8.4* 8.4* 8.6* 9.5  MG 1.9 1.7 1.7 1.8  PHOS 2.2* 3.5 2.7  --    Recent Labs    03/27/24 0556 03/28/24 0725 04/11/24 0901  AST 20 28 29   ALT 20 18 24   ALKPHOS 35* 39 69  BILITOT 0.5 <0.2 0.3  PROT 5.0* 5.0* 6.9  ALBUMIN 2.8* 3.2* 4.1   Recent Labs    03/25/24 0656 03/26/24 0711 03/27/24 0556 03/28/24 0725 04/11/24 0901  WBC 4.6 4.3 3.8* 5.3 4.0  NEUTROABS 3.8 3.3  --   --  2.7  HGB 9.3* 9.2* 9.0* 9.1* 11.1*  HCT 29.4* 27.6* 27.9* 27.6* 32.0*  MCV 99.0 97.5 98.6 97.9 96.1  PLT 166 135* 137* 160 249   Lab Results  Component Value Date   TSH 5.030 (H) 09/13/2023   No results found for: HGBA1C Lab Results  Component Value Date   CHOL 190 04/08/2023   HDL 70 04/08/2023   LDLCALC 93 04/08/2023   TRIG 170 (H) 04/08/2023   CHOLHDL 2.7 04/08/2023    Significant Diagnostic Results in last 30 days:  CT L-SPINE NO CHARGE Result Date: 04/11/2024 CLINICAL DATA:  Generalized pain, bile duct cancer EXAM: CT LUMBAR SPINE WITHOUT CONTRAST TECHNIQUE: Multidetector CT imaging of the lumbar spine was performed without intravenous contrast administration. Multiplanar CT image reconstructions were also generated. RADIATION DOSE REDUCTION: This exam was performed according to the departmental dose-optimization program which includes automated exposure control, adjustment of the mA and/or kV according to patient size and/or use of iterative reconstruction technique. COMPARISON:  CT abdomen 04/11/2024; MRI lumbar spine 03/23/2024 FINDINGS: Segmentation: The lowest lumbar type non-rib-bearing vertebra is labeled as L5. Alignment: 4 mm degenerative anterolisthesis at L4-5 and 3 mm degenerative anterolisthesis at L3-4.  Vertebrae: No findings of osseous metastatic disease in the lumbar spine. No pars defects noted. Paraspinal and other soft tissues: Please see dedicated CT abdomen report; no additional significant paraspinal findings. Disc levels: T12-L1: Unremarkable L1-2: Unremarkable L2-3: No impingement. Degenerative facet arthropathy and mild disc bulge. L3-4: Prominent central stenosis due to disc uncovering and disc bulge. Degenerative facet arthropathy and ligamentum flavum redundancy noted. L4-5: Moderate to prominent central stenosis due to disc uncovering, disc bulge, facet arthropathy, and ligamentum flavum redundancy. L5-S1: No impingement.  Degenerative facet arthropathy. IMPRESSION: 1. Lumbar spondylosis and degenerative disc disease, causing impingement at L3-4 and L4-5. 2. No findings of osseous metastatic disease in the lumbar spine. Electronically Signed  By: Ryan Salvage M.D.   On: 04/11/2024 13:52   CT T-SPINE NO CHARGE Result Date: 04/11/2024 CLINICAL DATA:  Bile duct cancer EXAM: CT Thoracic Spine without contrast TECHNIQUE: Multiplanar CT images of the thoracic spine were reconstructed from contemporary CT of the Chest. RADIATION DOSE REDUCTION: This exam was performed according to the departmental dose-optimization program which includes automated exposure control, adjustment of the mA and/or kV according to patient size and/or use of iterative reconstruction technique. CONTRAST:  None or No additional COMPARISON:  Chest CT 03/23/2024 and 04/11/2024 FINDINGS: Alignment: 3 mm grade 1 degenerative anterolisthesis at T2-3. Vertebrae: Mild anterior interbody spurring at all levels between T2 and T9. No compelling findings of osseous metastatic disease in the thoracic spine. Paraspinal and other soft tissues: Please see dedicated CT chest report; otherwise no additional paraspinal findings. Disc levels: No thoracic spine impingement identified. IMPRESSION: 1. No compelling findings of osseous metastatic  disease in the thoracic spine. 2. 3 mm grade 1 degenerative anterolisthesis at T2-3. 3. Mild anterior interbody spurring at all levels between T2 and T9. Electronically Signed   By: Ryan Salvage M.D.   On: 04/11/2024 13:49   CT CHEST ABDOMEN PELVIS W CONTRAST Result Date: 04/11/2024 CLINICAL DATA:  Common bile duct adenocarcinoma restaging. Generalized pain. * Tracking Code: BO * EXAM: CT CHEST, ABDOMEN, AND PELVIS WITH CONTRAST TECHNIQUE: Multidetector CT imaging of the chest, abdomen and pelvis was performed following the standard protocol during bolus administration of intravenous contrast. RADIATION DOSE REDUCTION: This exam was performed according to the departmental dose-optimization program which includes automated exposure control, adjustment of the mA and/or kV according to patient size and/or use of iterative reconstruction technique. CONTRAST:  80mL OMNIPAQUE  IOHEXOL  300 MG/ML  SOLN COMPARISON:  Multiple exams, including 12/22/2023 and 03/23/2024 FINDINGS: CT CHEST FINDINGS Cardiovascular: Coronary, aortic arch, and branch vessel atherosclerotic vascular disease. Mediastinum/Nodes: Unremarkable Lungs/Pleura: Biapical pleuroparenchymal scarring. Mild scarring medially in the right middle lobe and lingula. Minimal airway plugging noted in the left upper lobe. Musculoskeletal: Mild thoracic spondylosis. CT ABDOMEN PELVIS FINDINGS Hepatobiliary: Pneumobilia with biliary stent traversing the hypoenhancing lesion surrounding the common bile duct and extending towards the pancreatic head. The hypoenhancing rind surrounding the biliary stent measures 2.0 by 2.1 cm on image 68 series 2. There is narrowing of the portal vein on image 78 series 8 worsened from 03/23/2024 with hypoenhancing tissue suspicious for tumor extending especially along the right side of the portal vein in this vicinity, but potentially extending circumferentially around a focal portion of the portal vein in the area of narrowing. The  stent currently appears patent. Gallbladder collapsed/empty potentially with a tiny gallstone. I do not see compelling findings of metastatic disease to the hepatic parenchyma. Pancreas: The hypoenhancing mass along the common bile duct abuts the pancreatic head without visibly intact fat plane between the mass and the pancreas. Spleen: Unremarkable Adrenals/Urinary Tract: Unremarkable Stomach/Bowel: Prominent stool throughout the colon favors constipation. Vascular/Lymphatic: Atherosclerosis is present, including aortoiliac atherosclerotic disease. Narrowing of the portal vein due to the tumor along the porta hepatis. Atheromatous plaque dorsally at the origins of the celiac trunk and SMA. Reproductive: Uterus absent. Other: No supplemental non-categorized findings. Musculoskeletal: Grade 1 anterolisthesis at L3-4 and L4-5. IMPRESSION: 1. The hypoenhancing mass along the common bile duct abuts the pancreatic head without visibly intact fat plane between the mass and the pancreas. The mass causes increase narrowing of the portal vein and may extend circumferentially around the portal vein in the area of narrowing.  The biliary stent remains patent. 2. No findings of distant metastatic disease. 3. Prominent stool throughout the colon favors constipation. 4.  Aortic Atherosclerosis (ICD10-I70.0). Electronically Signed   By: Ryan Salvage M.D.   On: 04/11/2024 13:46   DG Chest 2 View Result Date: 04/11/2024 CLINICAL DATA:  Chest pain. EXAM: CHEST - 2 VIEW COMPARISON:  January 29, 2017. FINDINGS: The heart size and mediastinal contours are within normal limits. Both lungs are clear. The visualized skeletal structures are unremarkable. IMPRESSION: No active cardiopulmonary disease. Electronically Signed   By: Lynwood Landy Raddle M.D.   On: 04/11/2024 13:01   US  Abdomen Limited RUQ (LIVER/GB) Result Date: 04/11/2024 CLINICAL DATA:  Right upper quadrant abdominal pain. EXAM: ULTRASOUND ABDOMEN LIMITED RIGHT UPPER  QUADRANT COMPARISON:  March 23, 2024. FINDINGS: Gallbladder: No cholelithiasis is noted. Mild gallbladder wall thickening is noted at approximately 3 mm. Positive sonographic Murphy's sign is noted. Common bile duct: Diameter: 3 mm which is within normal limits. Liver: No focal lesion identified. Within normal limits in parenchymal echogenicity. Portal vein is patent on color Doppler imaging with normal direction of blood flow towards the liver. Other: None. IMPRESSION: Mild gallbladder wall thickening is noted with positive sonographic Murphy's sign. No cholelithiasis is noted. If there is clinical concern for acalculous cholecystitis, HIDA scan may be performed for further evaluation. Electronically Signed   By: Lynwood Landy Raddle M.D.   On: 04/11/2024 12:22   DG ERCP Result Date: 03/27/2024 CLINICAL DATA:  Cholangiocarcinoma.  Stent change. EXAM: ERCP TECHNIQUE: Multiple spot images obtained with the fluoroscopic device and submitted for interpretation post-procedure. FLUOROSCOPY: Radiation Exposure Index (as provided by the fluoroscopic device): 21.81 mGy Kerma COMPARISON:  CT 03/16/2024 FINDINGS: Plastic biliary stent was removed. Cholangiogram demonstrates narrowing and irregularity of the mid common bile duct. A metallic biliary stent was placed in the common bile duct. IMPRESSION: Focal narrowing and irregularity in the mid common bile duct. Placement of metallic biliary stent. These images were submitted for radiologic interpretation only. Please see the procedural report. Electronically Signed   By: Juliene Balder M.D.   On: 03/27/2024 17:19   MR LUMBAR SPINE W WO CONTRAST Result Date: 03/23/2024 CLINICAL DATA:  History of biliary cancer. Question metastatic disease. Back pain. EXAM: MRI LUMBAR SPINE WITHOUT AND WITH CONTRAST TECHNIQUE: Multiplanar and multiecho pulse sequences of the lumbar spine were obtained without and with intravenous contrast. CONTRAST:  5mL GADAVIST  GADOBUTROL  1 MMOL/ML IV SOLN  COMPARISON:  Radiography 11/17/2021 FINDINGS: Segmentation:  5 lumbar type vertebral bodies. Alignment: Degenerative anterolisthesis of 4 mm at L3-4 and 6 mm at L4-5. Vertebrae: No fracture or focal bone lesion. No evidence of regional metastatic disease. Edema and enhancement of the facet arthropathy at L3-4 and L4-5. Conus medullaris and cauda equina: Conus extends to the L1 level. Conus and cauda equina appear normal. Paraspinal and other soft tissues: Negative Disc levels: No significant finding from T11-12 through L1-2. L2-3: Mild disc bulge. Moderate bilateral facet degeneration and hypertrophy. Mild canal narrowing but no neural compression. L3-4: Advanced bilateral facet arthropathy with 4 mm of degenerative anterolisthesis. Broad-based disc herniation. Moderate to severe multifactorial stenosis that could cause neural compression on either or both sides. This is slightly worse on the right. L4-5: Advanced bilateral facet arthropathy with anterolisthesis of 6 mm. Shallow protrusion of the disc. Moderate multifactorial stenosis with potential for neural compression in the lateral recesses. This is more pronounced on the right. Moderate foraminal narrowing on the right as well. L5-S1: No disc  abnormality.  Mild facet degeneration.  No stenosis. IMPRESSION: 1. No evidence of metastatic disease to the lumbar spine. 2. L3-4: Advanced bilateral facet arthropathy with 4 mm of anterolisthesis. Broad-based disc herniation. Moderate to severe multifactorial stenosis that could cause neural compression on either or both sides. This is slightly worse on the right. The facet arthritis could certainly be painful. 3. L4-5: Advanced bilateral facet arthropathy with 6 mm of anterolisthesis. Shallow protrusion of the disc. Moderate multifactorial stenosis with potential for neural compression in the lateral recesses, more pronounced on the right. Moderate foraminal narrowing on the right as well. The facet arthritis could be  painful. 4. L2-3: Disc bulge. Moderate bilateral facet degeneration and hypertrophy. Mild canal narrowing but no neural compression. 5. L5-S1: Mild facet degeneration. No stenosis. Electronically Signed   By: Oneil Officer M.D.   On: 03/23/2024 12:54   MR THORACIC SPINE W WO CONTRAST Result Date: 03/23/2024 CLINICAL DATA:  Metastatic disease evaluation. Bile duct cancer. Surgery yesterday. EXAM: MRI THORACIC WITHOUT AND WITH CONTRAST TECHNIQUE: Multiplanar and multiecho pulse sequences of the thoracic spine were obtained without and with intravenous contrast. CONTRAST:  5mL GADAVIST  GADOBUTROL  1 MMOL/ML IV SOLN COMPARISON:  None Available. FINDINGS: Alignment:  No significant malalignment.  Minimal scoliosis. Vertebrae: No fracture or focal bone lesion. No evidence of bony metastatic disease. Cord:  No cord compression or metastatic disease to the cord. Paraspinal and other soft tissues: Tiny pleural effusions layering dependently. Disc levels: No significant disc level finding. No compressive stenosis of the canal or foramina. 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 IMPRESSION: 1. No evidence of metastatic disease to the thoracic spine. 2. Tiny pleural effusions layering dependently. Electronically Signed   By: Oneil Officer M.D.   On: 03/23/2024 12:49   CT Angio Abdomen W and/or Wo Contrast Result Date: 03/23/2024 CLINICAL DATA:  Aortic aneurysm suspected. Nausea vomiting and diarrhea. Abdominal pain radiating to back. History of bile duct cancer. * Tracking Code: BO * EXAM: CT ANGIOGRAPHY ABDOMEN TECHNIQUE: Multidetector CT imaging of the abdomen was performed using the standard protocol during bolus administration of intravenous contrast. Multiplanar reconstructed images and MIPs were obtained and reviewed to evaluate the vascular anatomy. RADIATION DOSE REDUCTION: This exam was performed according to the departmental dose-optimization program which includes  automated exposure control, adjustment of the mA and/or kV according to patient size and/or use of iterative reconstruction technique. CONTRAST:  90mL OMNIPAQUE  IOHEXOL  350 MG/ML SOLN COMPARISON:  CT scan abdomen and pelvis from 03/16/2024. FINDINGS: VASCULAR Aorta: Normal caliber aorta without aneurysm, dissection, vasculitis or significant stenosis. Celiac: Patent without evidence of aneurysm, dissection, vasculitis or significant stenosis. SMA: Patent without evidence of aneurysm, dissection, vasculitis or significant stenosis. 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 All the renal arteries are patent without evidence of aneurysm, dissection, vasculitis, fibromuscular dysplasia or significant stenosis. IMA: Small caliber. Patent without evidence of aneurysm, dissection, vasculitis or significant stenosis. Inflow: Patent without evidence of aneurysm, dissection, vasculitis or significant stenosis. Veins: No obvious venous abnormality within the limitations of this arterial phase study. Review of the MIP images confirms the above findings. NON-VASCULAR Lower chest: There are patchy atelectatic changes in the visualized lung bases. No overt consolidation. No pleural effusion. The heart is normal in size. No pericardial effusion. Hepatobiliary: The liver is normal in size. Non-cirrhotic configuration. No suspicious mass. Since the prior study, there is exchange of common bile duct stent. There is new  wider stent extending from the common hepatic duct into the second part of duodenum. There is resultant pneumobilia mainly in the left hepatic lobe. No intrahepatic bile duct dilation. Very small volume 2-3 mm calcified gallstones noted in the fundus region without imaging signs of acute cholecystitis. Normal gallbladder wall thickness. No pericholecystic inflammatory changes. Pancreas: Unremarkable. No pancreatic ductal dilatation or surrounding inflammatory changes. Spleen: Within  normal limits. No focal lesion. Adrenals/Urinary Tract: Adrenal glands are unremarkable. No suspicious renal mass. No nephroureterolithiasis or obstructive uropathy on either side Stomach/Bowel: No disproportionate dilation of the small or large bowel loops. No evidence of abnormal bowel wall thickening or inflammatory changes. The appendix is unremarkable. Vascular/Lymphatic: No ascites or pneumoperitoneum. No abdominal or pelvic lymphadenopathy, by size criteria. No aneurysmal dilation of the major abdominal arteries. There are mild peripheral atherosclerotic vascular calcifications of the aorta and its major branches. Other: There is a tiny fat containing umbilical hernia. The soft tissues and abdominal wall are otherwise unremarkable. Musculoskeletal: No suspicious osseous lesions. There are mild multilevel degenerative changes in the visualized spine. IMPRESSION: 1. No acute inflammatory process identified within the abdomen or pelvis. 2. No aortic aneurysm, dissection or periaortic fat stranding. 3. Multiple other nonacute observations, as described above. Aortic Atherosclerosis (ICD10-I70.0). Electronically Signed   By: Ree Molt M.D.   On: 03/23/2024 10:26    Assessment/Plan    Malignant neoplasm of intrahepatic bile ducts Completed radiation and chemotherapy. Current focus on pain management due to waxing and waning pain over the past month. Pain management is a priority, with a focus on minimizing opioid use due to potential side effects such as constipation. 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 Pain management is a priority, with a focus on minimizing  opioid use due to potential side effects such as constipation. - Use pain patch for continuous pain relief - Prescribe oxycodone  5 mg every 12 hours as needed for pain - Continue with Tylenol  as needed  Chronic constipation Chronic constipation with recent improvement after using Miralax for two weeks. Recent bowel movement this morning. Diarrhea noted, indicating possible overuse of laxatives. Adequate hydration is emphasized to aid in stool softening. - Continue Miralax daily unless diarrhea occurs, then adjust to every other day - Increase water intake to help soften stool - Consider using Senokot if needed  Gastroesophageal reflux disease Intermittent acid reflux symptoms with occasional spit-up, possibly related to constipation. Protonix  is being used for management. - Continue Protonix  40 mg as needed for acid reflux  Unintentional weight loss Weight decreased from 97 lbs to 88.8 lbs. Encouraged to eat small, frequent meals to manage weight and prevent further loss. - Encourage small, frequent meals - Use nausea medication 30 minutes before meals if needed  Nausea, intermittent Intermittent nausea, previously managed with Zofran . Currently not using Zofran  regularly. - Use Zofran  as needed for nausea, especially before meals  Hypertension Blood pressure is well-controlled at 124/60 mmHg. Currently on losartan  50 mg. - Continue losartan  50 mg for blood pressure management  Hyperlipidemia Currently managed with rosuvastatin . - Continue rosuvastatin  for hyperlipidemia management  Osteoporosis Osteoporosis managed with Prolia  injections every six months. - Continue Prolia  injections every six  months   Family/ staff Communication: Reviewed plan of care with patient and son verbalized understanding   Labs/tests ordered: None   Next Appointment: Return if symptoms worsen or fail to improve.   Total time: 30 minutes. Greater than 50% of total time spent doing patient  education regarding post hospital follow up ,health maintenance including symptom/medication management.   Roxan JAYSON Plough, NP

## 2024-04-20 DIAGNOSIS — C240 Malignant neoplasm of extrahepatic bile duct: Secondary | ICD-10-CM | POA: Diagnosis not present

## 2024-04-20 DIAGNOSIS — I10 Essential (primary) hypertension: Secondary | ICD-10-CM | POA: Diagnosis not present

## 2024-04-20 DIAGNOSIS — J90 Pleural effusion, not elsewhere classified: Secondary | ICD-10-CM | POA: Diagnosis not present

## 2024-04-20 DIAGNOSIS — G893 Neoplasm related pain (acute) (chronic): Secondary | ICD-10-CM | POA: Diagnosis not present

## 2024-04-20 DIAGNOSIS — M810 Age-related osteoporosis without current pathological fracture: Secondary | ICD-10-CM | POA: Diagnosis not present

## 2024-04-20 DIAGNOSIS — K219 Gastro-esophageal reflux disease without esophagitis: Secondary | ICD-10-CM

## 2024-04-20 DIAGNOSIS — K529 Noninfective gastroenteritis and colitis, unspecified: Secondary | ICD-10-CM | POA: Diagnosis not present

## 2024-04-20 DIAGNOSIS — Z556 Problems related to health literacy: Secondary | ICD-10-CM

## 2024-04-20 DIAGNOSIS — F0153 Vascular dementia, unspecified severity, with mood disturbance: Secondary | ICD-10-CM | POA: Diagnosis not present

## 2024-04-20 DIAGNOSIS — I160 Hypertensive urgency: Secondary | ICD-10-CM

## 2024-04-20 DIAGNOSIS — F339 Major depressive disorder, recurrent, unspecified: Secondary | ICD-10-CM | POA: Diagnosis not present

## 2024-04-20 DIAGNOSIS — M48061 Spinal stenosis, lumbar region without neurogenic claudication: Secondary | ICD-10-CM | POA: Diagnosis not present

## 2024-04-20 DIAGNOSIS — E785 Hyperlipidemia, unspecified: Secondary | ICD-10-CM

## 2024-04-20 DIAGNOSIS — G43909 Migraine, unspecified, not intractable, without status migrainosus: Secondary | ICD-10-CM

## 2024-04-20 DIAGNOSIS — D630 Anemia in neoplastic disease: Secondary | ICD-10-CM | POA: Diagnosis not present

## 2024-04-20 DIAGNOSIS — E43 Unspecified severe protein-calorie malnutrition: Secondary | ICD-10-CM | POA: Diagnosis not present

## 2024-04-20 DIAGNOSIS — M1990 Unspecified osteoarthritis, unspecified site: Secondary | ICD-10-CM | POA: Diagnosis not present

## 2024-04-23 ENCOUNTER — Ambulatory Visit: Payer: Medicare (Managed Care) | Attending: Radiation Oncology

## 2024-04-23 DIAGNOSIS — Z510 Encounter for antineoplastic radiation therapy: Secondary | ICD-10-CM | POA: Insufficient documentation

## 2024-04-23 DIAGNOSIS — C248 Malignant neoplasm of overlapping sites of biliary tract: Secondary | ICD-10-CM | POA: Insufficient documentation

## 2024-04-25 ENCOUNTER — Other Ambulatory Visit: Payer: Self-pay | Admitting: Nurse Practitioner

## 2024-04-25 DIAGNOSIS — M48061 Spinal stenosis, lumbar region without neurogenic claudication: Secondary | ICD-10-CM | POA: Diagnosis not present

## 2024-04-25 DIAGNOSIS — J90 Pleural effusion, not elsewhere classified: Secondary | ICD-10-CM | POA: Diagnosis not present

## 2024-04-25 DIAGNOSIS — D630 Anemia in neoplastic disease: Secondary | ICD-10-CM | POA: Diagnosis not present

## 2024-04-25 DIAGNOSIS — M810 Age-related osteoporosis without current pathological fracture: Secondary | ICD-10-CM | POA: Diagnosis not present

## 2024-04-25 DIAGNOSIS — M1990 Unspecified osteoarthritis, unspecified site: Secondary | ICD-10-CM | POA: Diagnosis not present

## 2024-04-25 DIAGNOSIS — F339 Major depressive disorder, recurrent, unspecified: Secondary | ICD-10-CM | POA: Diagnosis not present

## 2024-04-25 DIAGNOSIS — F0153 Vascular dementia, unspecified severity, with mood disturbance: Secondary | ICD-10-CM | POA: Diagnosis not present

## 2024-04-25 DIAGNOSIS — G893 Neoplasm related pain (acute) (chronic): Secondary | ICD-10-CM | POA: Diagnosis not present

## 2024-04-25 DIAGNOSIS — C240 Malignant neoplasm of extrahepatic bile duct: Secondary | ICD-10-CM | POA: Diagnosis not present

## 2024-04-25 NOTE — Telephone Encounter (Signed)
 Hasn't been prescribed by pcp

## 2024-05-03 ENCOUNTER — Other Ambulatory Visit: Payer: Self-pay | Admitting: Nurse Practitioner

## 2024-05-03 DIAGNOSIS — I10 Essential (primary) hypertension: Secondary | ICD-10-CM

## 2024-05-09 ENCOUNTER — Encounter: Payer: Self-pay | Admitting: Oncology

## 2024-05-09 ENCOUNTER — Encounter: Payer: Self-pay | Admitting: Gastroenterology

## 2024-05-10 ENCOUNTER — Telehealth: Payer: Self-pay | Admitting: Oncology

## 2024-05-10 NOTE — Telephone Encounter (Signed)
 Scheduled patient for labs and a follow-up next week. Called and spoke with the patients son and he is aware.

## 2024-05-18 ENCOUNTER — Inpatient Hospital Stay: Payer: Medicare (Managed Care) | Attending: Oncology

## 2024-05-18 ENCOUNTER — Inpatient Hospital Stay (HOSPITAL_BASED_OUTPATIENT_CLINIC_OR_DEPARTMENT_OTHER): Payer: Medicare (Managed Care) | Admitting: Oncology

## 2024-05-18 VITALS — BP 138/68 | HR 68 | Temp 98.0°F | Resp 16 | Ht 60.0 in | Wt 92.0 lb

## 2024-05-18 DIAGNOSIS — R222 Localized swelling, mass and lump, trunk: Secondary | ICD-10-CM | POA: Diagnosis not present

## 2024-05-18 DIAGNOSIS — K828 Other specified diseases of gallbladder: Secondary | ICD-10-CM | POA: Diagnosis not present

## 2024-05-18 DIAGNOSIS — Z79899 Other long term (current) drug therapy: Secondary | ICD-10-CM | POA: Diagnosis not present

## 2024-05-18 DIAGNOSIS — I10 Essential (primary) hypertension: Secondary | ICD-10-CM | POA: Insufficient documentation

## 2024-05-18 DIAGNOSIS — R109 Unspecified abdominal pain: Secondary | ICD-10-CM | POA: Insufficient documentation

## 2024-05-18 DIAGNOSIS — Z7982 Long term (current) use of aspirin: Secondary | ICD-10-CM | POA: Diagnosis not present

## 2024-05-18 DIAGNOSIS — C240 Malignant neoplasm of extrahepatic bile duct: Secondary | ICD-10-CM

## 2024-05-18 DIAGNOSIS — C221 Intrahepatic bile duct carcinoma: Secondary | ICD-10-CM | POA: Insufficient documentation

## 2024-05-18 DIAGNOSIS — M546 Pain in thoracic spine: Secondary | ICD-10-CM | POA: Diagnosis present

## 2024-05-18 DIAGNOSIS — R978 Other abnormal tumor markers: Secondary | ICD-10-CM | POA: Diagnosis not present

## 2024-05-18 DIAGNOSIS — E785 Hyperlipidemia, unspecified: Secondary | ICD-10-CM | POA: Diagnosis not present

## 2024-05-18 LAB — CBC WITH DIFFERENTIAL (CANCER CENTER ONLY)
Abs Immature Granulocytes: 0 K/uL (ref 0.00–0.07)
Basophils Absolute: 0 K/uL (ref 0.0–0.1)
Basophils Relative: 1 %
Eosinophils Absolute: 0.1 K/uL (ref 0.0–0.5)
Eosinophils Relative: 2 %
HCT: 32.3 % — ABNORMAL LOW (ref 36.0–46.0)
Hemoglobin: 11 g/dL — ABNORMAL LOW (ref 12.0–15.0)
Immature Granulocytes: 0 %
Lymphocytes Relative: 24 %
Lymphs Abs: 1 K/uL (ref 0.7–4.0)
MCH: 32.4 pg (ref 26.0–34.0)
MCHC: 34.1 g/dL (ref 30.0–36.0)
MCV: 95 fL (ref 80.0–100.0)
Monocytes Absolute: 0.3 K/uL (ref 0.1–1.0)
Monocytes Relative: 8 %
Neutro Abs: 2.7 K/uL (ref 1.7–7.7)
Neutrophils Relative %: 65 %
Platelet Count: 210 K/uL (ref 150–400)
RBC: 3.4 MIL/uL — ABNORMAL LOW (ref 3.87–5.11)
RDW: 13.5 % (ref 11.5–15.5)
WBC Count: 4.1 K/uL (ref 4.0–10.5)
nRBC: 0 % (ref 0.0–0.2)

## 2024-05-18 LAB — CMP (CANCER CENTER ONLY)
ALT: 29 U/L (ref 0–44)
AST: 38 U/L (ref 15–41)
Albumin: 4.1 g/dL (ref 3.5–5.0)
Alkaline Phosphatase: 70 U/L (ref 38–126)
Anion gap: 7 (ref 5–15)
BUN: 16 mg/dL (ref 8–23)
CO2: 29 mmol/L (ref 22–32)
Calcium: 10 mg/dL (ref 8.9–10.3)
Chloride: 105 mmol/L (ref 98–111)
Creatinine: 0.77 mg/dL (ref 0.44–1.00)
GFR, Estimated: >60 mL/min
Glucose, Bld: 142 mg/dL — ABNORMAL HIGH (ref 70–99)
Potassium: 3.9 mmol/L (ref 3.5–5.1)
Sodium: 141 mmol/L (ref 135–145)
Total Bilirubin: 0.3 mg/dL (ref 0.0–1.2)
Total Protein: 6.9 g/dL (ref 6.5–8.1)

## 2024-05-18 LAB — MAGNESIUM: Magnesium: 1.9 mg/dL (ref 1.7–2.4)

## 2024-05-18 NOTE — Progress Notes (Unsigned)
 [facility]  ONCOLOGY CLINIC PROGRESS NOTE   Patient Care Team: [name], NP as PCP - General (Geriatric Medicine) Cleatus Collar, MD as Consulting Physician (Ophthalmology) Camillo Golas, MD as Consulting Physician (Ophthalmology) Winfred Curlee DEL, MD (Inactive) as Consulting Physician (Gynecology) Ardis Evalene CROME, RN as Oncology Nurse Navigator  PATIENT NAME: Caroline Snyder   MR#: [id number] DOB: 12-Jun-1941  Date of visit: 05/18/2024   ASSESSMENT & PLAN:   Caroline Snyder is a 83 y.o.  lady with a past medical history of vascular dementia, HTN, HLD, depression, migraines and GERD, was referred to our clinic for newly diagnosed adenocarcinoma of the biliary duct.   Primary cholangiocarcinoma of extrahepatic bile duct (HCC) Please review oncology history for additional details and timeline of events.    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  IHC stain for H. pylori was positive.   On 12/22/2023 CT chest with contrast showed no evidence of intrathoracic metastatic disease.  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   She did have consultation with Dr. Dasie.  Dr. Dasie did offer surgery as an option but because of the local extent of the disease, she will need to undergo Whipple surgery.  Patient and family members would like for her to avoid surgery as they are worried about her postoperative recovery from Whipple surgery.  They opted to proceed with concurrent chemoradiation instead. Treatment aims to control disease, not cure.   She began concurrent chemoradiation with capecitabine  from 02/13/2024.  Capecitabine  was given at a dose of 800  mg/m twice daily on Monday to Friday during the course of radiation.  This translated to 1000 mg p.o. twice daily dosing for her. She completed treatments on 03/22/2024.  She underwent biliary stent exchange on 03/19/2024.  The stent is functioning well, with normal bilirubin and liver function tests.   On her last visit, she had to be directed to the ED for persistent abdominal pain and a CT scan on 04/11/2024 showed patent biliary stent and no evidence of disease progression.  It showed treatment-related changes.  No current concerns related to the stent or tumor progression based on recent imaging.  Clinically she is looking a lot better currently.  CA 19-9 and CEA are pending from today.  LFTs are all normal.  Hemoglobin 11, CBCD unremarkable.  Plan to obtain MRI of the abdomen in early December, followed by office visit to discuss results.   I reviewed lab results and outside records for this visit and discussed relevant results with the patient. Diagnosis, plan of care and treatment options were also discussed in detail with the patient. Opportunity provided to ask questions and answers provided to her apparent satisfaction. Provided instructions to call our clinic with any problems, questions or concerns prior to return visit. I recommended to continue follow-up with PCP and sub-specialists. She verbalized understanding and agreed with the plan.   NCCN guidelines have been consulted in the planning of this patient's care.  I spent a total of 25 minutes during this encounter with the patient including review of chart and various tests results, discussions about plan of care and coordination of care plan.   Chinita Patten, MD  05/18/2024 3:57  PM  [facility] - A DEPT OF [facility] [address] [place] [address] Dept: [phone] Dept Fax: [phone]    CHIEF COMPLAINT/ REASON FOR VISIT:   Cholangiocarcinoma  of extrahepatic biliary duct  Current Treatment: Patient did not want to proceed with surgery.  Plan made to treat her with concurrent chemoradiation with capecitabine . She began concurrent chemoradiation with capecitabine  from 02/13/2024 and completed treatments on 03/22/2024.  INTERVAL HISTORY:    Discussed the use of AI scribe software for clinical note transcription with the patient, who gave verbal consent to proceed.  History of Present Illness  Caroline Snyder is an 83 year old female who presents for follow-up after a recent emergency room visit for severe pain.  Her pain has improved since the last visit, following a severe episode that necessitated an emergency room visit. During that visit, she was hunched over due to the intensity of the pain. She believes earlier management of her bowel issues and a reduction in pain medication might have prevented the escalation.  No nausea or vomiting. She has been eating well.  She recently underwent surgery and is not currently seeing her GI doctors or surgeons. The stent is described as permanent, but there may be a need for precautionary follow-up.  Her cancer marker, CA 19-9, had previously decreased from 180 to 138 in July, and she is awaiting the results of the latest test.    I have reviewed the past medical history, past surgical history, social history and family history with the patient and they are unchanged from previous note.  HISTORY OF PRESENT ILLNESS:   ONCOLOGY HISTORY:   83 y.o. lady with a past medical history of vascular dementia, HTN, HLD, depression, migraines and GERD, was admitted to the hospital on 12/18/2023 after she presented with intractable nausea and vomiting.  She was noted to be hypertensive and tachypneic.  Labs showed  AST/ALT 300s, and Alk Phos 315. RUQ US  with CBD dilation.  She was admitted for further workup and evaluation.   On 12/18/2023, MRCP showed moderate to severe intrahepatic  bile duct dilatation.  There was an abrupt beak like narrowing of the mid common bile duct at the level of the head of the pancreas.  Findings concerning for stricture at the level of head of pancreas.  Mild diffuse edema within the pancreatic parenchyma noted, which was concerning for pancreatitis. Mild increase caliber of the main pancreatic duct which measured up to 4 mm at the level of the pancreatic neck, which abruptly decreases in caliber at the head of pancreas. Focal area of mild increased T2 signal with restricted diffusion in the anterior head of pancreas measures 2.2 x 1.2 by 2.2 cm. Cannot exclude pancreatic head neoplasm. Consider further evaluation with endoscopic ultrasound and tissue sampling.   CA 19-9 was increased at 180 on 12/19/2023.   On 12/21/2023, Dr. Wilhelmenia performed ERCP.  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  Stricture was malignant appearing.  The upper third of the main bile duct, left main hepatic duct and right main hepatic duct were moderately dilated, secondary to a stricture.  Extremely difficult cannulation requiring attempt at double wire, over pancreatic stent, precut fistulotomy to eventually gain biliary access. One temporary plastic biliary stent was placed into the ventral pancreatic duct.  One plastic biliary stent was placed in the CBD to traverse the  stricture.   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  IHC stain for H. pylori was positive.     On 12/22/2023 CT chest with contrast showed no evidence of intrathoracic metastatic disease.   Patient presented to clinic on 12/29/2023 to establish care with us .  She was in significant amount of abdominal/right chest wall pain and had to be directed to ED for further evaluation and  admission.   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    She did have consultation with Dr. Dasie.  Dr. Dasie did offer surgery as an option but because of the local extent of the disease, she will need to undergo Whipple surgery.   Patient and family members would like for her to avoid surgery as they are worried about her postoperative recovery from Whipple surgery.  They would like to proceed with concurrent chemoradiation instead.   She is scheduled to see Dr. Dewey later today and is scheduled to begin radiation treatment from 02/13/2024.  Will proceed with concurrent chemotherapy with dose reduced capecitabine  and will continue this during the course of radiation.  Dorothyann will be given a dose of 800 mg/m twice daily on Monday to Friday during the course of radiation.  This translated to 1000 mg p.o. twice daily dosing for her.   She began concurrent chemoradiation with capecitabine  from 02/13/2024. She completed treatments on 03/22/2024.   REVIEW OF SYSTEMS:   Review of Systems  Musculoskeletal:  Positive for back pain.    All other pertinent systems were reviewed with the patient and are negative.  ALLERGIES: She is allergic to aricept  [donepezil ] and vioxx [rofecoxib].  MEDICATIONS:  Current Outpatient Medications  Medication Sig Dispense Refill   acetaminophen  (TYLENOL ) 500 MG tablet Take 500-1,000 mg by mouth every 8 (eight) hours as needed (for pain).     aspirin  EC 81 MG tablet Take 1 tablet (81 mg total) by mouth daily. Swallow whole. 30 tablet 11   Ensure (ENSURE) Take 237 mLs by mouth 2 (two) times daily between meals.     lidocaine  (LIDODERM ) 5 % Place 1 patch onto the skin daily. Remove & Discard patch within 12 hours or as directed by MD 14 patch 0   losartan  (COZAAR ) 50 MG tablet Take 1 tablet (50 mg total) by mouth daily. 30 tablet 0   Multiple  Vitamins-Minerals (ONE-A-DAY WOMENS VITACRAVES PO) Take by mouth daily.     ondansetron  (ZOFRAN ) 8 MG tablet Take 1 tablet (8 mg total) by mouth every 8 (eight) hours as needed for nausea or vomiting. 30 tablet 1   oxyCODONE  (OXY IR/ROXICODONE ) 5 MG immediate release tablet Take 1 tablet (5 mg total) by mouth every 12 (twelve) hours as needed for severe pain (pain score 7-10). 60 tablet 0   pantoprazole  (PROTONIX ) 40 MG tablet Take 40 mg by mouth daily as needed (for stomach acid).     polyethylene glycol (MIRALAX / GLYCOLAX) 17 g packet Take 17 g by mouth every other day.     Probiotic Product (PROBIOTIC DAILY PO) Take by mouth daily.     prochlorperazine  (COMPAZINE ) 10 MG tablet Take 0.5 tablets (5 mg total) by mouth every 6 (six) hours as needed for refractory nausea /  vomiting. 15 tablet 0   rosuvastatin  (CRESTOR ) 20 MG tablet TAKE 1 TABLET BY MOUTH EVERY DAY 90 tablet 1   saccharomyces boulardii (FLORASTOR) 250 MG capsule Take 1 capsule (250 mg total) by mouth 2 (two) times daily. 14 capsule 0   Current Facility-Administered Medications  Medication Dose Route Frequency Provider Last Rate Last Admin   denosumab  (PROLIA ) injection 60 mg  60 mg Subcutaneous Once Eubanks, Jessica K, NP         VITALS:   Blood pressure 138/68, pulse 68, temperature 98 F (36.7 C), temperature source Temporal, resp. rate 16, height 5' (1.524 m), weight 92 lb (41.7 kg), SpO2 100%.  Wt Readings from Last 3 Encounters:  05/18/24 92 lb (41.7 kg)  04/17/24 88 lb 12.8 oz (40.3 kg)  04/11/24 90 lb (40.8 kg)    Body mass index is 17.97 kg/m.    Onc Performance Status - 05/18/24 1537       ECOG Perf Status   ECOG Perf Status Restricted in physically strenuous activity but ambulatory and able to carry out work of a light or sedentary nature, e.g., light house work, office work      KPS SCALE   KPS % SCORE Able to carry on normal activity, minor s/s of disease           PHYSICAL EXAM:   Physical  Exam Constitutional:      General: She is in acute distress (from pain).  HENT:     Head: Normocephalic and atraumatic.     Right Ear: Tympanic membrane, ear canal and external ear normal.  Cardiovascular:     Rate and Rhythm: Normal rate.     Heart sounds: Normal heart sounds.  Pulmonary:     Effort: Pulmonary effort is normal. No respiratory distress.     Breath sounds: Normal breath sounds.  Abdominal:     General: There is no distension.  Neurological:     General: No focal deficit present.     Mental Status: She is alert and oriented to person, place, and time.  Psychiatric:        Mood and Affect: Mood normal.        Behavior: Behavior normal.       LABORATORY DATA:   I have reviewed the data as listed.  Results for orders placed or performed in visit on 05/18/24  Magnesium   Result Value Ref Range   Magnesium  1.9 1.7 - 2.4 mg/dL  CBC with Differential (Cancer Center Only)  Result Value Ref Range   WBC Count 4.1 4.0 - 10.5 K/uL   RBC 3.40 (L) 3.87 - 5.11 MIL/uL   Hemoglobin 11.0 (L) 12.0 - 15.0 g/dL   HCT 67.6 (L) 63.9 - 53.9 %   MCV 95.0 80.0 - 100.0 fL   MCH 32.4 26.0 - 34.0 pg   MCHC 34.1 30.0 - 36.0 g/dL   RDW 86.4 88.4 - 84.4 %   Platelet Count 210 150 - 400 K/uL   nRBC 0.0 0.0 - 0.2 %   Neutrophils Relative % 65 %   Neutro Abs 2.7 1.7 - 7.7 K/uL   Lymphocytes Relative 24 %   Lymphs Abs 1.0 0.7 - 4.0 K/uL   Monocytes Relative 8 %   Monocytes Absolute 0.3 0.1 - 1.0 K/uL   Eosinophils Relative 2 %   Eosinophils Absolute 0.1 0.0 - 0.5 K/uL   Basophils Relative 1 %   Basophils Absolute 0.0 0.0 - 0.1 K/uL   Immature Granulocytes 0 %  Abs Immature Granulocytes 0.00 0.00 - 0.07 K/uL  CMP (Cancer Center only)  Result Value Ref Range   Sodium 141 135 - 145 mmol/L   Potassium 3.9 3.5 - 5.1 mmol/L   Chloride 105 98 - 111 mmol/L   CO2 29 22 - 32 mmol/L   Glucose, Bld 142 (H) 70 - 99 mg/dL   BUN 16 8 - 23 mg/dL   Creatinine 9.22 9.55 - 1.00 mg/dL    Calcium  10.0 8.9 - 10.3 mg/dL   Total Protein 6.9 6.5 - 8.1 g/dL   Albumin 4.1 3.5 - 5.0 g/dL   AST 38 15 - 41 U/L   ALT 29 0 - 44 U/L   Alkaline Phosphatase 70 38 - 126 U/L   Total Bilirubin 0.3 0.0 - 1.2 mg/dL   GFR, Estimated >39 >39 mL/min   Anion gap 7 5 - 15      RADIOGRAPHIC STUDIES:  I have personally reviewed the radiological images as listed and agree with the findings in the report.  CT L-SPINE NO CHARGE CLINICAL DATA:  Generalized pain, bile duct cancer  EXAM: CT LUMBAR SPINE WITHOUT CONTRAST  TECHNIQUE: Multidetector CT imaging of the lumbar spine was performed without intravenous contrast administration. Multiplanar CT image reconstructions were also generated.  RADIATION DOSE REDUCTION: This exam was performed according to the departmental dose-optimization program which includes automated exposure control, adjustment of the mA and/or kV according to patient size and/or use of iterative reconstruction technique.  COMPARISON:  CT abdomen 04/11/2024; MRI lumbar spine 03/23/2024  FINDINGS: Segmentation: The lowest lumbar type non-rib-bearing vertebra is labeled as L5.  Alignment: 4 mm degenerative anterolisthesis at L4-5 and 3 mm degenerative anterolisthesis at L3-4.  Vertebrae: No findings of osseous metastatic disease in the lumbar spine. No pars defects noted.  Paraspinal and other soft tissues: Please see dedicated CT abdomen report; no additional significant paraspinal findings.  Disc levels:  T12-L1: Unremarkable  L1-2: Unremarkable  L2-3: No impingement. Degenerative facet arthropathy and mild disc bulge.  L3-4: Prominent central stenosis due to disc uncovering and disc bulge. Degenerative facet arthropathy and ligamentum flavum redundancy noted.  L4-5: Moderate to prominent central stenosis due to disc uncovering, disc bulge, facet arthropathy, and ligamentum flavum redundancy.  L5-S1: No impingement.  Degenerative facet  arthropathy.  IMPRESSION: 1. Lumbar spondylosis and degenerative disc disease, causing impingement at L3-4 and L4-5. 2. No findings of osseous metastatic disease in the lumbar spine.  Electronically Signed   By: Ryan Salvage M.D.   On: 04/11/2024 13:52 CT T-SPINE NO CHARGE CLINICAL DATA:  Bile duct cancer  EXAM: CT Thoracic Spine without contrast  TECHNIQUE: Multiplanar CT images of the thoracic spine were reconstructed from contemporary CT of the Chest.  RADIATION DOSE REDUCTION: This exam was performed according to the departmental dose-optimization program which includes automated exposure control, adjustment of the mA and/or kV according to patient size and/or use of iterative reconstruction technique.  CONTRAST:  None or No additional  COMPARISON:  Chest CT 03/23/2024 and 04/11/2024  FINDINGS: Alignment: 3 mm grade 1 degenerative anterolisthesis at T2-3.  Vertebrae: Mild anterior interbody spurring at all levels between T2 and T9. No compelling findings of osseous metastatic disease in the thoracic spine.  Paraspinal and other soft tissues: Please see dedicated CT chest report; otherwise no additional paraspinal findings.  Disc levels: No thoracic spine impingement identified.  IMPRESSION: 1. No compelling findings of osseous metastatic disease in the thoracic spine. 2. 3 mm grade 1 degenerative anterolisthesis at T2-3.  3. Mild anterior interbody spurring at all levels between T2 and T9.  Electronically Signed   By: Ryan Salvage M.D.   On: 04/11/2024 13:49 CT CHEST ABDOMEN PELVIS W CONTRAST CLINICAL DATA:  Common bile duct adenocarcinoma restaging. Generalized pain.  * Tracking Code: BO *  EXAM: CT CHEST, ABDOMEN, AND PELVIS WITH CONTRAST  TECHNIQUE: Multidetector CT imaging of the chest, abdomen and pelvis was performed following the standard protocol during bolus administration of intravenous contrast.  RADIATION DOSE REDUCTION: This  exam was performed according to the departmental dose-optimization program which includes automated exposure control, adjustment of the mA and/or kV according to patient size and/or use of iterative reconstruction technique.  CONTRAST:  80mL OMNIPAQUE  IOHEXOL  300 MG/ML  SOLN  COMPARISON:  Multiple exams, including 12/22/2023 and 03/23/2024  FINDINGS: CT CHEST FINDINGS  Cardiovascular: Coronary, aortic arch, and branch vessel atherosclerotic vascular disease.  Mediastinum/Nodes: Unremarkable  Lungs/Pleura: Biapical pleuroparenchymal scarring. Mild scarring medially in the right middle lobe and lingula. Minimal airway plugging noted in the left upper lobe.  Musculoskeletal: Mild thoracic spondylosis.  CT ABDOMEN PELVIS FINDINGS  Hepatobiliary: Pneumobilia with biliary stent traversing the hypoenhancing lesion surrounding the common bile duct and extending towards the pancreatic head. The hypoenhancing rind surrounding the biliary stent measures 2.0 by 2.1 cm on image 68 series 2. There is narrowing of the portal vein on image 78 series 8 worsened from 03/23/2024 with hypoenhancing tissue suspicious for tumor extending especially along the right side of the portal vein in this vicinity, but potentially extending circumferentially around a focal portion of the portal vein in the area of narrowing. The stent currently appears patent. Gallbladder collapsed/empty potentially with a tiny gallstone. I do not see compelling findings of metastatic disease to the hepatic parenchyma.  Pancreas: The hypoenhancing mass along the common bile duct abuts the pancreatic head without visibly intact fat plane between the mass and the pancreas.  Spleen: Unremarkable  Adrenals/Urinary Tract: Unremarkable  Stomach/Bowel: Prominent stool throughout the colon favors constipation.  Vascular/Lymphatic: Atherosclerosis is present, including aortoiliac atherosclerotic disease. Narrowing of the  portal vein due to the tumor along the porta hepatis. Atheromatous plaque dorsally at the origins of the celiac trunk and SMA.  Reproductive: Uterus absent.  Other: No supplemental non-categorized findings.  Musculoskeletal: Grade 1 anterolisthesis at L3-4 and L4-5.  IMPRESSION: 1. The hypoenhancing mass along the common bile duct abuts the pancreatic head without visibly intact fat plane between the mass and the pancreas. The mass causes increase narrowing of the portal vein and may extend circumferentially around the portal vein in the area of narrowing. The biliary stent remains patent. 2. No findings of distant metastatic disease. 3. Prominent stool throughout the colon favors constipation. 4.  Aortic Atherosclerosis (ICD10-I70.0).  Electronically Signed   By: Ryan Salvage M.D.   On: 04/11/2024 13:46 DG Chest 2 View CLINICAL DATA:  Chest pain.  EXAM: CHEST - 2 VIEW  COMPARISON:  January 29, 2017.  FINDINGS: The heart size and mediastinal contours are within normal limits. Both lungs are clear. The visualized skeletal structures are unremarkable.  IMPRESSION: No active cardiopulmonary disease.  Electronically Signed   By: Lynwood Landy Raddle M.D.   On: 04/11/2024 13:01 US  Abdomen Limited RUQ (LIVER/GB) CLINICAL DATA:  Right upper quadrant abdominal pain.  EXAM: ULTRASOUND ABDOMEN LIMITED RIGHT UPPER QUADRANT  COMPARISON:  March 23, 2024.  FINDINGS: Gallbladder:  No cholelithiasis is noted. Mild gallbladder wall thickening is noted at approximately 3 mm. Positive sonographic Murphy's sign  is noted.  Common bile duct:  Diameter: 3 mm which is within normal limits.  Liver:  No focal lesion identified. Within normal limits in parenchymal echogenicity. Portal vein is patent on color Doppler imaging with normal direction of blood flow towards the liver.  Other: None.  IMPRESSION: Mild gallbladder wall thickening is noted with positive  sonographic Murphy's sign. No cholelithiasis is noted. If there is clinical concern for acalculous cholecystitis, HIDA scan may be performed for further evaluation.  Electronically Signed   By: Lynwood Landy Raddle M.D.   On: 04/11/2024 12:22     CODE STATUS:  Code Status History     Date Active Date Inactive Code Status Order ID Comments User Context   12/18/2023 1558 12/23/2023 2029 Full Code [id number]  Georgina Basket, MD ED   09/12/2023 2330 09/13/2023 1728 Limited: Do not attempt resuscitation (DNR) -DNR-LIMITED -Do Not Intubate/DNI  [id number]  Lou Claretta HERO, MD ED   11/24/2013 1103 11/26/2013 2046 Full Code [id number]  Katrinka Garnette KIDD, MD Inpatient    Questions for Most Recent Historical Code Status (Order [id number])     Question Answer   By: Consent: discussion documented in EHR                Advance Directive Documentation    Flowsheet Row Most Recent Value  Type of Advance Directive Healthcare Power of Attorney, Living will  Pre-existing out of facility DNR order (yellow form or pink MOST form) --  MOST Form in Place? --    Orders Placed This Encounter  Procedures   MR ABDOMEN MRCP W WO CONTAST    Standing Status:   Future    Expected Date:   07/04/2024    Expiration Date:   05/21/2025    If indicated for the ordered procedure, I authorize the administration of contrast media per Radiology protocol:   Yes    What is the patient's sedation requirement?:   No Sedation    Does the patient have a pacemaker or implanted devices?:   No    Preferred imaging location?:   [facility] (table limit - 500lbs)   CBC with Differential (Cancer Center Only)    Standing Status:   Future    Expected Date:   07/18/2024    Expiration Date:   10/16/2024   CMP (Cancer Center only)    Standing Status:   Future    Expected Date:   07/18/2024    Expiration Date:   10/16/2024   CEA (Access)    Standing Status:   Future    Expected Date:   07/18/2024    Expiration Date:    10/16/2024   Cancer antigen 19-9    Standing Status:   Future    Expected Date:   07/18/2024    Expiration Date:   10/16/2024     Future Appointments  Date Time Provider Department Center  07/18/2024  8:00 AM [facility] None  07/18/2024  8:30 AM Edrick Whitehorn, Chinita, MD [facility] None     This document was completed utilizing speech recognition software. Grammatical errors, random word insertions, pronoun errors, and incomplete sentences are an occasional consequence of this system due to software limitations, ambient noise, and hardware issues. Any formal questions or concerns about the content, text or information contained within the body of this dictation should be directly addressed to the provider for clarification.

## 2024-05-19 LAB — CANCER ANTIGEN 19-9: CA 19-9: 64 U/mL — ABNORMAL HIGH (ref 0–35)

## 2024-05-21 ENCOUNTER — Encounter: Payer: Self-pay | Admitting: Oncology

## 2024-05-21 LAB — CEA (ACCESS): CEA (CHCC): 4.06 ng/mL (ref 0.00–5.00)

## 2024-05-21 NOTE — Assessment & Plan Note (Addendum)
 Please review oncology history for additional details and timeline of events.    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  IHC stain for H. pylori was positive.   On 12/22/2023 CT chest with contrast showed no evidence of intrathoracic metastatic disease.  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   She did have consultation with Dr. Dasie.  Dr. Dasie did offer surgery as an option but because of the local extent of the disease, she will need to undergo Whipple surgery.  Patient and family members would like for her to avoid surgery as they are worried about her postoperative recovery from Whipple surgery.  They opted to proceed with concurrent chemoradiation instead. Treatment aims to control disease, not cure.   She began concurrent chemoradiation with capecitabine  from 02/13/2024.  Capecitabine  was given at a dose of 800 mg/m twice daily on Monday to Friday during the course of radiation.  This translated to 1000 mg p.o. twice daily dosing for her. She completed treatments on 03/22/2024.  She underwent biliary stent exchange on 03/19/2024.  The stent is functioning well, with normal bilirubin and liver function tests.   On her last visit, she had to be directed to the ED for persistent abdominal pain and a CT scan on 04/11/2024 showed patent biliary stent and no evidence of disease progression.  It showed treatment-related changes.  No current concerns related to the stent or tumor progression based on recent imaging.  Clinically she is looking a lot better currently.  CA 19-9 and CEA are pending from today.  LFTs are all normal.  Hemoglobin 11, CBCD unremarkable.  Plan to obtain MRI of the  abdomen in early December, followed by office visit to discuss results.

## 2024-06-25 ENCOUNTER — Ambulatory Visit: Payer: Self-pay

## 2024-06-25 ENCOUNTER — Telehealth: Payer: Self-pay | Admitting: Oncology

## 2024-06-25 NOTE — Telephone Encounter (Signed)
 Pt's son called to get appt time and day moved up due to PT not feeling well.

## 2024-06-25 NOTE — Telephone Encounter (Signed)
 FYI Only or Action Required?: FYI only for provider: appointment scheduled on 06/26/24.  Patient was last seen in primary care on 04/17/2024 by [name], Roxan BROCKS, NP.  Called Nurse Triage reporting Hypertension (/).  Symptoms began a week ago.  Interventions attempted: Prescription medications: Losartan .  Symptoms are: unchanged.  Triage Disposition: See PCP When Office is Open (Within 3 Days)  Patient/caregiver understands and will follow disposition?: Yes  Copied from CRM #[id number]. Topic: Clinical - Red Word Triage >> Jun 25, 2024 10:39 AM Susanna ORN wrote: Red Word that prompted transfer to Nurse Triage: Patient's son, [name] Ping, calling in because he states that his mother's blood pressure has dropped this past week. States her blood pressure medication was changed to 50 mg from 100 mg after cancer treatment. Wants to see about increasing the dosage back to 100 mg. Reason for Disposition  Systolic BP >= 160 OR Diastolic >= 100  Answer Assessment - Initial Assessment Questions Spoke with pts son [name] (on [place]). Reports pt has been having high blood pressure for past week. BP this morning 163/74. Reports having her losartan  cut from 100 mg to 50 mg 3 months ago while pt was ungoing chemo and radiation about 2 months ago. Denies CP, SOB, dizziness, changes to vision or weakness. Reports intermittent nausea x1 week. Has not missed any doses of medication. Son asking if losartan  needs to be increased back to 100 mg now that she is done with chemo/radiation. Scheduled appt with different provider at home office tomorrow d/t no PCP availability within timeframe. Advised UC or ED for worsening symptoms.    1. BLOOD PRESSURE: What is your blood pressure? Did you take at least two measurements 5 minutes apart?     This morning 163/74. Friday was 180/79.  2. ONSET: When did you take your blood pressure?     This morning 163/74. Friday was 180/79.  3. HOW: How did you take your  blood pressure? (e.g., automatic home BP monitor, visiting nurse)     Home cuff  4. HISTORY: Do you have a history of high blood pressure?     Yes  5. MEDICINES: Are you taking any medicines for blood pressure? Have you missed any doses recently?     Losartan  50 mg. Son reports pt has not missed any doses  6. OTHER SYMPTOMS: Do you have any symptoms? (e.g., blurred vision, chest pain, difficulty breathing, headache, weakness)     Nausea for 1 week, threw up 1-2x. Denies CP, SOB or other symptoms  Protocols used: Blood Pressure - High-A-AH

## 2024-06-26 ENCOUNTER — Encounter: Payer: Self-pay | Admitting: Adult Health

## 2024-06-26 ENCOUNTER — Ambulatory Visit: Admitting: Adult Health

## 2024-06-26 ENCOUNTER — Other Ambulatory Visit: Payer: Self-pay | Admitting: Oncology

## 2024-06-26 VITALS — BP 124/82 | HR 66 | Temp 97.8°F | Ht 60.0 in | Wt 90.4 lb

## 2024-06-26 DIAGNOSIS — C240 Malignant neoplasm of extrahepatic bile duct: Secondary | ICD-10-CM | POA: Diagnosis not present

## 2024-06-26 DIAGNOSIS — I10 Essential (primary) hypertension: Secondary | ICD-10-CM

## 2024-06-26 DIAGNOSIS — K5901 Slow transit constipation: Secondary | ICD-10-CM | POA: Diagnosis not present

## 2024-06-26 DIAGNOSIS — R112 Nausea with vomiting, unspecified: Secondary | ICD-10-CM | POA: Diagnosis not present

## 2024-06-26 MED ORDER — ONDANSETRON HCL 8 MG PO TABS: 8.0000 mg | ORAL_TABLET | Freq: Three times a day (TID) | ORAL | 3 refills | Status: DC | PRN

## 2024-06-26 MED ORDER — ONDANSETRON HCL 8 MG PO TABS: 8.0000 mg | ORAL_TABLET | Freq: Three times a day (TID) | ORAL | 1 refills | Status: DC | PRN

## 2024-06-26 MED ORDER — HYDRALAZINE HCL 10 MG PO TABS: 10.0000 mg | ORAL_TABLET | Freq: Two times a day (BID) | ORAL | 0 refills | Status: AC | PRN

## 2024-06-26 NOTE — Progress Notes (Signed)
 [facility] clinic  Provider:  Jereld Serum DNP  Code Status:  DNR  Goals of Care:     06/29/2024   11:30 AM  Advanced Directives  Does Patient Have a Medical Advance Directive? No     Chief Complaint  Patient presents with   Medical Management of Chronic Issues    HTN was elevated Saturday and Sunday. Typically the BP is elevated late in the evening.Patient is feeling nauseated today   Emesis    On and off for about 2 weeks   Discussed the use of AI scribe software for clinical note transcription with the patient, who gave verbal consent to proceed.   HPI: Patient is a 83 y.o. female seen today for an acute visit for hypertension. She is accompanied by her daughter-in-law and granddaughter.  She experiences fluctuations in blood pressure, with a home reading of 190/59 attributed to a missed dose of medication. After resuming her medication, her blood pressure decreased but tends to spike later in the day, particularly after 5 PM. She is currently taking Losartan  50 mg of her blood pressure medication daily, reduced from 100 mg after a hospital visit in September.  She has been experiencing nausea for the past 10 to 12 days, which has progressed from occurring at night to being constant throughout the day, accompanied by vomiting. She is scheduled to see her oncologist on Monday for further evaluation, including scans and lab work. She is currently taking 8 mg of Zofran  every eight hours as needed for nausea, but there is confusion regarding the dosage of her nausea medications. She also has Compazine  available but uses it less frequently due to its sedative effects.  She has a history of primary cholangiocarcinoma of the extrahepatic bile duct and underwent her last chemotherapy session in September during a hospital stay. She has been experiencing constipation, with bowel movements described as 'big balls,' indicating hard stools. She has Miralax at home. She is not currently on  any pain medication, and her previous use of oxycodone  has been discontinued.  She lives alone but is monitored by family through cameras in her home. Her family is considering hiring additional help to ensure she takes her medications and monitors her blood pressure. She has a history of acid reflux, previously managed with pantoprazole , but has not had symptoms since her hospital discharge. She is not currently taking any acid reflux medication.    Past Medical History:  Diagnosis Date   Abnormal CT of the chest 11/24/2013   See CT chest 11/22/13  1. No CT evidence of pulmonary arterial embolic disease.   2. Enlarged lymph nodes in the right hilar and subcarinal region.   These may represent reactive lymph nodes, clinical correlation   recommended. There is otherwise no evidence of mediastinal or   parenchymal masses or nodules nor infiltrates.   3. Atelectasis versus scarring within the lung bases as well as   interst   Acute encephalopathy 09/13/2023   Adenocarcinoma determined by biopsy of bile duct (HCC) 2025   Allergy    Altered mental status 09/13/2023   Benign neoplasm of colon 02/11/2012   Bunion 07/01/2010   Closed nondisplaced fracture of distal phalanx of right thumb August 02, 2020   Death of child 11-Sep-2016   Adult son truck driver died 87/86/82 in accident.     Depression 05/05/2007   External hemorrhoids without mention of complication 03/24/1999   GERD (gastroesophageal reflux disease) 08/02/1998   Hyperlipidemia 06/29/2006   Hypertensive emergency 09/13/2023  Memory loss 04/15/2003   Migraine without aura, without mention of intractable migraine without mention of status migrainosus 03/24/1999   Osteoporosis, unspecified 04/15/2004   Tear film insufficiency, unspecified 03/24/1999   Unspecified essential hypertension 11/22/2007    Past Surgical History:  Procedure Laterality Date   COLONOSCOPY  06-16-2007   Internal hemorrhoids,laxity of anal sphincter,polyp at 25cm  form anal verge, tortuous sigmoid colon. Dr.Orr    ERCP N/A 12/21/2023   Procedure: ERCP, WITH INTERVENTION IF INDICATED;  Surgeon: Wilhelmenia Aloha Raddle., MD;  Location[facility] ENDOSCOPY;  Service: Gastroenterology;  Laterality: N/A;   ERCP N/A 03/19/2024   Procedure: ERCP, WITH INTERVENTION IF INDICATED;  Surgeon: Wilhelmenia Aloha Raddle., MD;  Location: [facility] ENDOSCOPY;  Service: Gastroenterology;  Laterality: N/A;   ESOPHAGOGASTRODUODENOSCOPY N/A 12/21/2023   Procedure: EGD (ESOPHAGOGASTRODUODENOSCOPY);  Surgeon: Wilhelmenia Aloha Raddle., MD;  Location: [facility] ENDOSCOPY;  Service: Gastroenterology;  Laterality: N/A;   EUS N/A 12/21/2023   Procedure: ULTRASOUND, UPPER GI TRACT, ENDOSCOPIC;  Surgeon: Wilhelmenia Aloha Raddle., MD;  Location: [facility] ENDOSCOPY;  Service: Gastroenterology;  Laterality: N/A;   EYE SURGERY Bilateral 2010   cataract Dr. Camillo   FINE NEEDLE ASPIRATION  12/21/2023   Procedure: FINE NEEDLE ASPIRATION;  Surgeon: Wilhelmenia Aloha Raddle., MD;  Location: [facility] ENDOSCOPY;  Service: Gastroenterology;;   VAGINAL HYSTERECTOMY  1980    Allergies  Allergen Reactions   Aricept  [Donepezil ] Diarrhea   Vioxx [Rofecoxib] Nausea Only    Pt. Can't remember what reaction was but asks to leave on her profile.     Outpatient Encounter Medications as of 06/26/2024  Medication Sig   acetaminophen  (TYLENOL ) 500 MG tablet Take 500-1,000 mg by mouth every 8 (eight) hours as needed (for pain).   aspirin  EC 81 MG tablet Take 1 tablet (81 mg total) by mouth daily. Swallow whole.   Ensure (ENSURE) Take 237 mLs by mouth 2 (two) times daily between meals.   hydrALAZINE  (APRESOLINE ) 10 MG tablet Take 1 tablet (10 mg total) by mouth 2 (two) times daily as needed. For SBP >160   losartan  (COZAAR ) 50 MG tablet Take 1 tablet (50 mg total) by mouth daily.   Multiple Vitamins-Minerals (ONE-A-DAY WOMENS VITACRAVES PO) Take by mouth daily.   Probiotic Product (PROBIOTIC DAILY PO) Take by mouth daily.   prochlorperazine   (COMPAZINE ) 10 MG tablet Take 0.5 tablets (5 mg total) by mouth every 6 (six) hours as needed for refractory nausea / vomiting.   rosuvastatin  (CRESTOR ) 20 MG tablet TAKE 1 TABLET BY MOUTH EVERY DAY   [DISCONTINUED] ondansetron  (ZOFRAN ) 8 MG tablet Take 1 tablet (8 mg total) by mouth every 8 (eight) hours as needed for nausea or vomiting.   ondansetron  (ZOFRAN ) 8 MG tablet Take 1 tablet (8 mg total) by mouth every 8 (eight) hours as needed for nausea or vomiting.   polyethylene glycol (MIRALAX / GLYCOLAX) 17 g packet Take 17 g by mouth every other day. (Patient not taking: Reported on 06/26/2024)   [DISCONTINUED] lidocaine  (LIDODERM ) 5 % Place 1 patch onto the skin daily. Remove & Discard patch within 12 hours or as directed by MD (Patient not taking: Reported on 06/26/2024)   [DISCONTINUED] oxyCODONE  (OXY IR/ROXICODONE ) 5 MG immediate release tablet Take 1 tablet (5 mg total) by mouth every 12 (twelve) hours as needed for severe pain (pain score 7-10). (Patient not taking: Reported on 06/26/2024)   [DISCONTINUED] pantoprazole  (PROTONIX ) 40 MG tablet Take 40 mg by mouth daily as needed (for stomach acid). (Patient not taking: Reported on 06/26/2024)   [  DISCONTINUED] saccharomyces boulardii (FLORASTOR) 250 MG capsule Take 1 capsule (250 mg total) by mouth 2 (two) times daily. (Patient not taking: Reported on 06/26/2024)   Facility-Administered Encounter Medications as of 06/26/2024  Medication   denosumab  (PROLIA ) injection 60 mg    Review of Systems:  Review of Systems  Constitutional:  Negative for appetite change, chills, fatigue and fever.  HENT:  Negative for congestion, hearing loss, rhinorrhea and sore throat.   Eyes: Negative.   Respiratory:  Negative for cough, shortness of breath and wheezing.   Cardiovascular:  Negative for chest pain, palpitations and leg swelling.  Gastrointestinal:  Positive for constipation, nausea and vomiting. Negative for abdominal pain and diarrhea.   Genitourinary:  Negative for dysuria.  Musculoskeletal:  Negative for arthralgias, back pain and myalgias.  Skin:  Negative for color change, rash and wound.  Neurological:  Negative for dizziness, weakness and headaches.  Psychiatric/Behavioral:  Negative for behavioral problems. The patient is not nervous/anxious.     Health Maintenance  Topic Date Due   Zoster Vaccines- Shingrix (2 of 2) 11/30/2022   Medicare Annual Wellness (AWV)  06/30/2023   Influenza Vaccine  03/02/2024   COVID-19 Vaccine (6 - 2025-26 season) 04/02/2024   DTaP/Tdap/Td (3 - Td or Tdap) 11/16/2028   Pneumococcal Vaccine: 50+ Years  Completed   Bone Density Scan  Completed   Meningococcal B Vaccine  Aged Out   Mammogram  Discontinued   Colonoscopy  Discontinued    Physical Exam: Vitals:   06/26/24 1450  BP: 124/82  Pulse: 66  Temp: 97.8 F (36.6 C)  TempSrc: Temporal  SpO2: 100%  Weight: 90 lb 6.4 oz (41 kg)  Height: 5' (1.524 m)   Body mass index is 17.66 kg/m. Physical Exam Constitutional:      General: She is not in acute distress. HENT:     Head: Normocephalic and atraumatic.     Nose: Nose normal.     Mouth/Throat:     Mouth: Mucous membranes are moist.  Eyes:     Conjunctiva/sclera: Conjunctivae normal.  Cardiovascular:     Rate and Rhythm: Normal rate and regular rhythm.  Pulmonary:     Effort: Pulmonary effort is normal.     Breath sounds: Normal breath sounds.  Abdominal:     General: Bowel sounds are normal.     Palpations: Abdomen is soft.  Musculoskeletal:        General: Normal range of motion.     Cervical back: Normal range of motion.  Skin:    General: Skin is warm and dry.  Neurological:     Mental Status: She is alert.  Psychiatric:        Mood and Affect: Mood normal.        Behavior: Behavior normal.        Thought Content: Thought content normal.        Judgment: Judgment normal.     Labs reviewed: Basic Metabolic Panel: Recent Labs    09/13/23 0340  12/13/23 1042 03/26/24 0711 03/27/24 0556 03/28/24 0725 04/11/24 0901 05/18/24 1448 06/26/24 1522 06/29/24 1144 06/29/24 1215  NA  --    < > 137 139 139 137 141 139 134* 132*  K  --    < > 4.0 3.5 4.0 4.1 3.9 4.6 4.3 4.8  CL  --    < > 109 107 107 103 105 102 97* 99  CO2  --    < > 23 23 21* 26 29 27 26   --  GLUCOSE  --    < > 117* 116* 109* 157* 142* 92 166* 139*  BUN  --    < > 11 12 10 15 16 20 12 15   CREATININE  --    < > 0.66 0.63 0.61 0.91 0.77 0.87 1.01* 1.00  CALCIUM   --    < > 8.4* 8.4* 8.6* 9.5 10.0 9.7 10.1  --   MG 2.0   < > 1.9 1.7 1.7 1.8 1.9  --   --   --   PHOS 2.7   < > 2.2* 3.5 2.7  --   --   --   --   --   TSH 5.030*  --   --   --   --   --   --   --   --   --    < > = values in this interval not displayed.   Liver Function Tests: Recent Labs    03/28/24 0725 04/11/24 0901 05/18/24 1448 06/26/24 1522  AST 28 29 38 26  ALT 18 24 29 15   ALKPHOS 39 69 70  --   BILITOT <0.2 0.3 0.3 0.3  PROT 5.0* 6.9 6.9 6.6  ALBUMIN 3.2* 4.1 4.1  --    Recent Labs    12/13/23 1042 12/18/23 1255 03/16/24 2007 03/23/24 0902 04/11/24 1226  LIPASE 38   < > 27 24 12   AMYLASE 93  --   --   --   --    < > = values in this interval not displayed.   No results for input(s): AMMONIA in the last 8760 hours. CBC: Recent Labs    05/18/24 1448 06/26/24 1522 06/29/24 1144 06/29/24 1215  WBC 4.1 6.7 4.5  --   NEUTROABS 2.7 5,266 2.9  --   HGB 11.0* 12.3 13.4 11.6*  HCT 32.3* 37.1 40.1 34.0*  MCV 95.0 94.4 94.4  --   PLT 210 293 277  --    Lipid Panel: No results for input(s): CHOL, HDL, LDLCALC, TRIG, CHOLHDL, LDLDIRECT in the last 8760 hours. No results found for: HGBA1C  Procedures since last visit: CT Angio Chest/Abd/Pel for Dissection W and/or W/WO Result Date: 06/29/2024 EXAM: CTA CHEST, ABDOMEN AND PELVIS WITH AND WITHOUT CONTRAST 06/29/2024 12:50:06 PM TECHNIQUE: CTA of the chest was performed with and without the administration of  intravenous contrast. CTA of the abdomen and pelvis was performed with and without the administration of intravenous contrast. 100 mL of iohexol  (OMNIPAQUE ) 350 MG/ML injection was administered. Multiplanar reformatted images are provided for review. MIP images are provided for review. Automated exposure control, iterative reconstruction, and/or weight based adjustment of the mA/kV was utilized to reduce the radiation dose to as low as reasonably achievable. COMPARISON: None available. CLINICAL HISTORY: Chest pain. Concern for acute aortic syndrome. Additional history of biliary duct adenocarcinoma. * Tracking Code: BO *. FINDINGS: VASCULATURE: AORTA: Non-IV contrast imaging demonstrates no aortic intramural hematoma. Contrast images demonstrate no aortic dissection or aneurysm. No abdominal aortic aneurysm. PULMONARY ARTERIES: No pulmonary embolism with the limits of this exam. No evidence of acute pulmonary aneurysm. GREAT VESSELS OF AORTIC ARCH: No acute finding. No dissection. No arterial occlusion or significant stenosis. CELIAC TRUNK: No acute finding. No occlusion or significant stenosis. SUPERIOR MESENTERIC ARTERY: No acute finding. No occlusion or significant stenosis. INFERIOR MESENTERIC ARTERY: No acute finding. No occlusion or significant stenosis. RENAL ARTERIES: No acute finding. No occlusion or significant stenosis. ILIAC ARTERIES: No acute finding. No occlusion or significant  stenosis. CHEST: MEDIASTINUM: No mediastinal lymphadenopathy. The heart and pericardium demonstrate no acute abnormality. LUNGS AND PLEURA: No suspicious pulmonary nodules. No evidence of pulmonary infarction or pneumonia. There is atelectasis in the right middle lobe and lingula. No evidence of pleural effusion or pneumothorax. THORACIC BONES AND SOFT TISSUES: No acute bone or soft tissue abnormality. Small mass in the posterior right breast with biopsy clip. No acute findings of the right breast. ABDOMEN AND PELVIS: LIVER:  Pneumobilia in the left hepatic lobe. No enhancing hepatic lesion. GALLBLADDER AND BILE DUCTS: Common bile duct stent in place. No biliary ductal dilatation. No evidence of biliary carcinoma recurrent or metastasis. Biliary stent in place without complication. SPLEEN: The spleen is unremarkable. PANCREAS: Atrophy of the pancreas is similar. No acute pancreatic findings. ADRENAL GLANDS: Bilateral adrenal glands demonstrate no acute abnormality. KIDNEYS, URETERS AND BLADDER: No stones in the kidneys or ureters. No hydronephrosis. No perinephric or periureteral stranding. Urinary bladder is unremarkable. GI AND BOWEL: Appendix not identified. Several diverticula of the sigmoid colon without acute inflammation. Stomach and duodenal sweep demonstrate no acute abnormality. There is no bowel obstruction. No abnormal bowel wall thickening or distension. REPRODUCTIVE: Reproductive organs are unremarkable. PERITONEUM AND RETROPERITONEUM: No ascites or free air. LYMPH NODES: No lymphadenopathy. ABDOMINAL BONES AND SOFT TISSUES: No aggressive osseous lesion. No acute soft tissue abnormality. IMPRESSION: 1. No evidence of acute aortic dissection or aneurysm. 2. No evidence of pulmonary embolism. 3. No acute pulmonary parenchymal findings. 4. No chest wall wall abnormality identified. 5. No evidence of biliary carcinoma recurrence or metastasis. 6. Biliary stent in place without complication. Electronically signed by: Norleen Boxer MD 06/29/2024 01:28 PM EST RP Workstation: [id number]    Assessment/Plan  1. Essential hypertension -  Blood pressure generally well-controlled with medication, occasional spikes due to missed doses. -  continue Losartan  50 mg daily - Prescribed hydralazine  10 mg twice daily as needed for systolic BP >160. - Ensure consistent medication adherence. - hydrALAZINE  (APRESOLINE ) 10 MG tablet; Take 1 tablet (10 mg total) by mouth 2 (two) times daily as needed. For SBP >160  Dispense: 20 tablet;  Refill: 0  2. Nausea and vomiting, unspecified vomiting type -  Persistent nausea and vomiting, possibly related to pancreatitis or bile duct issues. Zofran  preferred for efficacy and less sedation. - Prescribed Zofran  8 mg every 8 hours as needed. - Encouraged fluid hydration. - ondansetron  (ZOFRAN ) 8 MG tablet; Take 1 tablet (8 mg total) by mouth every 8 (eight) hours as needed for nausea or vomiting.  Dispense: 30 tablet; Refill: 3 - CBC with Differential/Platelets - Comprehensive metabolic panel  3. 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  - Continue follow-up with oncologist on Monday. - Await lab results: metabolic panel, electrolytes, CEA, CBC.  4. Slow transit constipation -  with hard stools, possibly contributing to nausea. - Encouraged daily Miralax 17 grams until soft, regular bowel movements.      Labs/tests ordered:   CBC, CMP   Return if symptoms worsen or fail to improve.  Alejos Reinhardt [name], NP

## 2024-06-27 LAB — CBC WITH DIFFERENTIAL/PLATELET
Absolute Lymphocytes: 1032 {cells}/uL (ref 850–3900)
Absolute Monocytes: 348 {cells}/uL (ref 200–950)
Basophils Absolute: 27 {cells}/uL (ref 0–200)
Basophils Relative: 0.4 %
Eosinophils Absolute: 27 {cells}/uL (ref 15–500)
Eosinophils Relative: 0.4 %
HCT: 37.1 % (ref 35.9–46.0)
Hemoglobin: 12.3 g/dL (ref 11.7–15.5)
MCH: 31.3 pg (ref 27.0–33.0)
MCHC: 33.2 g/dL (ref 31.6–35.4)
MCV: 94.4 fL (ref 81.4–101.7)
MPV: 10.3 fL (ref 7.5–12.5)
Monocytes Relative: 5.2 %
Neutro Abs: 5266 {cells}/uL (ref 1500–7800)
Neutrophils Relative %: 78.6 %
Platelets: 293 Thousand/uL (ref 140–400)
RBC: 3.93 Million/uL (ref 3.80–5.10)
RDW: 11.3 % (ref 11.0–15.0)
Total Lymphocyte: 15.4 %
WBC: 6.7 Thousand/uL (ref 3.8–10.8)

## 2024-06-27 LAB — COMPREHENSIVE METABOLIC PANEL WITH GFR
AG Ratio: 1.8 (calc) (ref 1.0–2.5)
ALT: 15 U/L (ref 6–29)
AST: 26 U/L (ref 10–35)
Albumin: 4.2 g/dL (ref 3.6–5.1)
Alkaline phosphatase (APISO): 71 U/L (ref 37–153)
BUN: 20 mg/dL (ref 7–25)
CO2: 27 mmol/L (ref 20–32)
Calcium: 9.7 mg/dL (ref 8.6–10.4)
Chloride: 102 mmol/L (ref 98–110)
Creat: 0.87 mg/dL (ref 0.60–0.95)
Globulin: 2.4 g/dL (ref 1.9–3.7)
Glucose, Bld: 92 mg/dL (ref 65–139)
Potassium: 4.6 mmol/L (ref 3.5–5.3)
Sodium: 139 mmol/L (ref 135–146)
Total Bilirubin: 0.3 mg/dL (ref 0.2–1.2)
Total Protein: 6.6 g/dL (ref 6.1–8.1)
eGFR: 66 mL/min/1.73m2 (ref >=60)

## 2024-06-29 ENCOUNTER — Encounter (HOSPITAL_COMMUNITY): Payer: Self-pay | Admitting: *Deleted

## 2024-06-29 ENCOUNTER — Emergency Department (HOSPITAL_COMMUNITY): Admission: EM | Admit: 2024-06-29 | Discharge: 2024-06-29 | Disposition: A | Discharge status: Home / Self Care

## 2024-06-29 ENCOUNTER — Other Ambulatory Visit: Payer: Self-pay

## 2024-06-29 ENCOUNTER — Emergency Department (HOSPITAL_COMMUNITY)

## 2024-06-29 DIAGNOSIS — R0789 Other chest pain: Secondary | ICD-10-CM

## 2024-06-29 DIAGNOSIS — Z7982 Long term (current) use of aspirin: Secondary | ICD-10-CM | POA: Diagnosis not present

## 2024-06-29 DIAGNOSIS — K2970 Gastritis, unspecified, without bleeding: Secondary | ICD-10-CM | POA: Insufficient documentation

## 2024-06-29 DIAGNOSIS — Z79899 Other long term (current) drug therapy: Secondary | ICD-10-CM | POA: Diagnosis not present

## 2024-06-29 DIAGNOSIS — F0390 Unspecified dementia without behavioral disturbance: Secondary | ICD-10-CM | POA: Diagnosis not present

## 2024-06-29 DIAGNOSIS — R10A1 Flank pain, right side: Secondary | ICD-10-CM

## 2024-06-29 DIAGNOSIS — R001 Bradycardia, unspecified: Secondary | ICD-10-CM | POA: Diagnosis not present

## 2024-06-29 LAB — CBC WITH DIFFERENTIAL/PLATELET
Abs Immature Granulocytes: 0.01 K/uL (ref 0.00–0.07)
Basophils Absolute: 0.1 K/uL (ref 0.0–0.1)
Basophils Relative: 1 %
Eosinophils Absolute: 0.1 K/uL (ref 0.0–0.5)
Eosinophils Relative: 1 %
HCT: 40.1 % (ref 36.0–46.0)
Hemoglobin: 13.4 g/dL (ref 12.0–15.0)
Immature Granulocytes: 0 %
Lymphocytes Relative: 26 %
Lymphs Abs: 1.2 K/uL (ref 0.7–4.0)
MCH: 31.5 pg (ref 26.0–34.0)
MCHC: 33.4 g/dL (ref 30.0–36.0)
MCV: 94.4 fL (ref 80.0–100.0)
Monocytes Absolute: 0.3 K/uL (ref 0.1–1.0)
Monocytes Relative: 8 %
Neutro Abs: 2.9 K/uL (ref 1.7–7.7)
Neutrophils Relative %: 64 %
Platelets: 277 K/uL (ref 150–400)
RBC: 4.25 MIL/uL (ref 3.87–5.11)
RDW: 11.5 % (ref 11.5–15.5)
WBC: 4.5 K/uL (ref 4.0–10.5)
nRBC: 0 % (ref 0.0–0.2)

## 2024-06-29 LAB — I-STAT CHEM 8, ED
BUN: 15 mg/dL (ref 8–23)
Calcium, Ion: 1.07 mmol/L — ABNORMAL LOW (ref 1.15–1.40)
Chloride: 99 mmol/L (ref 98–111)
Creatinine, Ser: 1 mg/dL (ref 0.44–1.00)
Glucose, Bld: 139 mg/dL — ABNORMAL HIGH (ref 70–99)
HCT: 34 % — ABNORMAL LOW (ref 36.0–46.0)
Hemoglobin: 11.6 g/dL — ABNORMAL LOW (ref 12.0–15.0)
Potassium: 4.8 mmol/L (ref 3.5–5.1)
Sodium: 132 mmol/L — ABNORMAL LOW (ref 135–145)
TCO2: 25 mmol/L (ref 22–32)

## 2024-06-29 LAB — BASIC METABOLIC PANEL WITH GFR
Anion gap: 11 (ref 5–15)
BUN: 12 mg/dL (ref 8–23)
CO2: 26 mmol/L (ref 22–32)
Calcium: 10.1 mg/dL (ref 8.9–10.3)
Chloride: 97 mmol/L — ABNORMAL LOW (ref 98–111)
Creatinine, Ser: 1.01 mg/dL — ABNORMAL HIGH (ref 0.44–1.00)
GFR, Estimated: 55 mL/min — ABNORMAL LOW (ref >60)
Glucose, Bld: 166 mg/dL — ABNORMAL HIGH (ref 70–99)
Potassium: 4.3 mmol/L (ref 3.5–5.1)
Sodium: 134 mmol/L — ABNORMAL LOW (ref 135–145)

## 2024-06-29 LAB — URINALYSIS, ROUTINE W REFLEX MICROSCOPIC
Bilirubin Urine: NEGATIVE
Glucose, UA: NEGATIVE mg/dL
Hgb urine dipstick: NEGATIVE
Ketones, ur: NEGATIVE mg/dL
Leukocytes,Ua: NEGATIVE
Nitrite: NEGATIVE
Protein, ur: NEGATIVE mg/dL
Specific Gravity, Urine: >1.046 — ABNORMAL HIGH (ref 1.005–1.030)
pH: 7 (ref 5.0–8.0)

## 2024-06-29 LAB — TROPONIN T, HIGH SENSITIVITY
Troponin T High Sensitivity: <15 ng/L (ref 0–19)
Troponin T High Sensitivity: <15 ng/L (ref 0–19)

## 2024-06-29 LAB — I-STAT CG4 LACTIC ACID, ED
Lactic Acid, Venous: 1.8 mmol/L (ref 0.5–1.9)
Lactic Acid, Venous: 0.8 mmol/L (ref 0.5–1.9)

## 2024-06-29 MED ORDER — FAMOTIDINE 20 MG PO TABS
40.0000 mg | ORAL_TABLET | Freq: Once | ORAL | Status: AC
  Administered 2024-06-29: 40 mg via ORAL
  Filled 2024-06-29: qty 2

## 2024-06-29 MED ORDER — FAMOTIDINE 40 MG PO TABS: 40.0000 mg | ORAL_TABLET | Freq: Every day | ORAL | 0 refills | Status: AC

## 2024-06-29 MED ORDER — LACTATED RINGERS IV BOLUS
500.0000 mL | Freq: Once | INTRAVENOUS | Status: AC
  Administered 2024-06-29: 500 mL via INTRAVENOUS

## 2024-06-29 MED ORDER — SUCRALFATE 1 G PO TABS: 1.0000 g | ORAL_TABLET | Freq: Three times a day (TID) | ORAL | 0 refills | Status: AC

## 2024-06-29 MED ORDER — SUCRALFATE 1 G PO TABS
1.0000 g | ORAL_TABLET | Freq: Once | ORAL | Status: AC
  Administered 2024-06-29: 1 g via ORAL
  Filled 2024-06-29: qty 1

## 2024-06-29 MED ORDER — FENTANYL CITRATE (PF) 50 MCG/ML IJ SOSY
50.0000 ug | PREFILLED_SYRINGE | Freq: Once | INTRAMUSCULAR | Status: AC
  Administered 2024-06-29: 50 ug via INTRAVENOUS
  Filled 2024-06-29: qty 1

## 2024-06-29 MED ORDER — IOHEXOL 350 MG/ML SOLN
100.0000 mL | Freq: Once | INTRAVENOUS | Status: AC | PRN
  Administered 2024-06-29: 100 mL via INTRAVENOUS

## 2024-06-29 MED ORDER — ONDANSETRON HCL 4 MG/2ML IJ SOLN
4.0000 mg | Freq: Once | INTRAMUSCULAR | Status: AC
  Administered 2024-06-29: 4 mg via INTRAVENOUS
  Filled 2024-06-29: qty 2

## 2024-06-29 NOTE — ED Provider Notes (Signed)
  EMERGENCY DEPARTMENT AT [facility] Provider Note   CSN: [id number] Arrival date & time: 06/29/24  1109     Patient presents with: Breast Pain   Caroline Snyder is a 83 y.o. female.   83 year old female presents for evaluation of right sided pain.  She has a history of dementia, but is also tearful and uncomfortable appearing, moaning in the bed.  Holding her right chest.  Son provides history.  States that she initially woke up, was her normal self, had coffee and started having some left-sided chest pain.  Patient states she initially thought it was her left breast.  Now patient is having right sided chest pain that radiates from her right chest to her right flank.  Admits to some vomiting today as well.  Son states she struggles with nausea and vomiting at times, has not had her usual Zofran  lately but has been struggling with nausea for the last 2 weeks.  Denies any other symptoms or concerns.        Prior to Admission medications   Medication Sig Start Date End Date Taking? Authorizing Provider  famotidine  (PEPCID ) 40 MG tablet Take 1 tablet (40 mg total) by mouth daily for 14 days. 06/29/24 07/13/24 Yes Danae Oland L, DO  sucralfate  (CARAFATE ) 1 g tablet Take 1 tablet (1 g total) by mouth in the morning, at noon, and at bedtime for 14 days. 06/29/24 07/13/24 Yes Niralya Ohanian L, DO  acetaminophen  (TYLENOL ) 500 MG tablet Take 500-1,000 mg by mouth every 8 (eight) hours as needed (for pain).    [provider]  aspirin  EC 81 MG tablet Take 1 tablet (81 mg total) by mouth daily. Swallow whole. 10/31/20   Eubanks, Jessica K, NP  Ensure (ENSURE) Take 237 mLs by mouth 2 (two) times daily between meals.    [provider]  hydrALAZINE  (APRESOLINE ) 10 MG tablet Take 1 tablet (10 mg total) by mouth 2 (two) times daily as needed. For SBP >160 06/26/24   [name], Monina C, NP  losartan  (COZAAR ) 50 MG tablet Take 1 tablet (50 mg total) by  mouth daily. 03/28/24   Krishnan, Gokul, MD  Multiple Vitamins-Minerals (ONE-A-DAY WOMENS VITACRAVES PO) Take by mouth daily.    [provider]  ondansetron  (ZOFRAN ) 8 MG tablet Take 1 tablet (8 mg total) by mouth every 8 (eight) hours as needed for nausea or vomiting. 06/26/24   [name], Monina C, NP  polyethylene glycol (MIRALAX / GLYCOLAX) 17 g packet Take 17 g by mouth every other day. Patient not taking: Reported on 06/26/2024    [provider]  Probiotic Product (PROBIOTIC DAILY PO) Take by mouth daily.    [provider]  prochlorperazine  (COMPAZINE ) 10 MG tablet Take 0.5 tablets (5 mg total) by mouth every 6 (six) hours as needed for refractory nausea / vomiting. 03/22/24   Cindy Garnette POUR, MD  rosuvastatin  (CRESTOR ) 20 MG tablet TAKE 1 TABLET BY MOUTH EVERY DAY 04/25/24   Eubanks, Jessica K, NP    Allergies: Aricept  [donepezil ] and Vioxx [rofecoxib]    Review of Systems  Constitutional:  Negative for chills and fever.  HENT:  Negative for ear pain and sore throat.   Eyes:  Negative for pain and visual disturbance.  Respiratory:  Negative for cough and shortness of breath.   Cardiovascular:  Positive for chest pain. Negative for palpitations.  Gastrointestinal:  Negative for abdominal pain and vomiting.  Genitourinary:  Negative for dysuria and hematuria.  Musculoskeletal:  Positive for back pain. Negative for arthralgias.  Skin:  Negative for color change and rash.  Neurological:  Negative for seizures and syncope.  All other systems reviewed and are negative.   Updated Vital Signs BP 120/61   Pulse (!) 51   Temp 97.6 F (36.4 C) (Oral)   Resp 18   Wt 40.8 kg   SpO2 99%   BMI 17.58 kg/m   Physical Exam Vitals and nursing note reviewed.  Constitutional:      General: She is not in acute distress.    Appearance: She is well-developed.     Comments: Uncomfortable appearing, moaning  HENT:     Head: Normocephalic and atraumatic.  Eyes:      Conjunctiva/sclera: Conjunctivae normal.  Cardiovascular:     Rate and Rhythm: Regular rhythm. Bradycardia present.     Heart sounds: No murmur heard. Pulmonary:     Effort: Pulmonary effort is normal. No respiratory distress.     Breath sounds: Normal breath sounds.  Abdominal:     Palpations: Abdomen is soft.     Tenderness: There is abdominal tenderness.     Comments: Right flank and right chest wall tenderness to palpation, right upper quadrant tenderness to palpation  Musculoskeletal:        General: No swelling.     Cervical back: Neck supple.  Skin:    General: Skin is warm and dry.     Capillary Refill: Capillary refill takes less than 2 seconds.  Neurological:     General: No focal deficit present.     Mental Status: She is alert.  Psychiatric:        Mood and Affect: Mood normal.     (all labs ordered are listed, but only abnormal results are displayed) Labs Reviewed  BASIC METABOLIC PANEL WITH GFR - Abnormal; Notable for the following components:      Result Value   Sodium 134 (*)    Chloride 97 (*)    Glucose, Bld 166 (*)    Creatinine, Ser 1.01 (*)    GFR, Estimated 55 (*)    All other components within normal limits  URINALYSIS, ROUTINE W REFLEX MICROSCOPIC - Abnormal; Notable for the following components:   Specific Gravity, Urine >1.046 (*)    All other components within normal limits  I-STAT CHEM 8, ED - Abnormal; Notable for the following components:   Sodium 132 (*)    Glucose, Bld 139 (*)    Calcium , Ion 1.07 (*)    Hemoglobin 11.6 (*)    HCT 34.0 (*)    All other components within normal limits  CBC WITH DIFFERENTIAL/PLATELET  I-STAT CG4 LACTIC ACID, ED  I-STAT CG4 LACTIC ACID, ED  TROPONIN T, HIGH SENSITIVITY  TROPONIN T, HIGH SENSITIVITY    EKG: EKG Interpretation Date/Time:  Friday June 29 2024 12:01:15 EST Ventricular Rate:  68 PR Interval:  114 QRS Duration:  104 QT Interval:  410 QTC Calculation: 436 R Axis:   -75  Text  Interpretation: Sinus rhythm Borderline short PR interval Left axis deviation RSR' in V1 or V2, probably normal variant Compared with prior EKG from 04/11/2024 Confirmed by Gennaro Bouchard ([id number]) on 06/29/2024 12:03:23 PM  Radiology: CT Angio Chest/Abd/Pel for Dissection W and/or W/WO Result Date: 06/29/2024 EXAM: CTA CHEST, ABDOMEN AND PELVIS WITH AND WITHOUT CONTRAST 06/29/2024 12:50:06 PM TECHNIQUE: CTA of the chest was performed with and without the administration of intravenous contrast. CTA of the abdomen and pelvis was performed with and without  the administration of intravenous contrast. 100 mL of iohexol  (OMNIPAQUE ) 350 MG/ML injection was administered. Multiplanar reformatted images are provided for review. MIP images are provided for review. Automated exposure control, iterative reconstruction, and/or weight based adjustment of the mA/kV was utilized to reduce the radiation dose to as low as reasonably achievable. COMPARISON: None available. CLINICAL HISTORY: Chest pain. Concern for acute aortic syndrome. Additional history of biliary duct adenocarcinoma. * Tracking Code: BO *. FINDINGS: VASCULATURE: AORTA: Non-IV contrast imaging demonstrates no aortic intramural hematoma. Contrast images demonstrate no aortic dissection or aneurysm. No abdominal aortic aneurysm. PULMONARY ARTERIES: No pulmonary embolism with the limits of this exam. No evidence of acute pulmonary aneurysm. GREAT VESSELS OF AORTIC ARCH: No acute finding. No dissection. No arterial occlusion or significant stenosis. CELIAC TRUNK: No acute finding. No occlusion or significant stenosis. SUPERIOR MESENTERIC ARTERY: No acute finding. No occlusion or significant stenosis. INFERIOR MESENTERIC ARTERY: No acute finding. No occlusion or significant stenosis. RENAL ARTERIES: No acute finding. No occlusion or significant stenosis. ILIAC ARTERIES: No acute finding. No occlusion or significant stenosis. CHEST: MEDIASTINUM: No mediastinal  lymphadenopathy. The heart and pericardium demonstrate no acute abnormality. LUNGS AND PLEURA: No suspicious pulmonary nodules. No evidence of pulmonary infarction or pneumonia. There is atelectasis in the right middle lobe and lingula. No evidence of pleural effusion or pneumothorax. THORACIC BONES AND SOFT TISSUES: No acute bone or soft tissue abnormality. Small mass in the posterior right breast with biopsy clip. No acute findings of the right breast. ABDOMEN AND PELVIS: LIVER: Pneumobilia in the left hepatic lobe. No enhancing hepatic lesion. GALLBLADDER AND BILE DUCTS: Common bile duct stent in place. No biliary ductal dilatation. No evidence of biliary carcinoma recurrent or metastasis. Biliary stent in place without complication. SPLEEN: The spleen is unremarkable. PANCREAS: Atrophy of the pancreas is similar. No acute pancreatic findings. ADRENAL GLANDS: Bilateral adrenal glands demonstrate no acute abnormality. KIDNEYS, URETERS AND BLADDER: No stones in the kidneys or ureters. No hydronephrosis. No perinephric or periureteral stranding. Urinary bladder is unremarkable. GI AND BOWEL: Appendix not identified. Several diverticula of the sigmoid colon without acute inflammation. Stomach and duodenal sweep demonstrate no acute abnormality. There is no bowel obstruction. No abnormal bowel wall thickening or distension. REPRODUCTIVE: Reproductive organs are unremarkable. PERITONEUM AND RETROPERITONEUM: No ascites or free air. LYMPH NODES: No lymphadenopathy. ABDOMINAL BONES AND SOFT TISSUES: No aggressive osseous lesion. No acute soft tissue abnormality. IMPRESSION: 1. No evidence of acute aortic dissection or aneurysm. 2. No evidence of pulmonary embolism. 3. No acute pulmonary parenchymal findings. 4. No chest wall wall abnormality identified. 5. No evidence of biliary carcinoma recurrence or metastasis. 6. Biliary stent in place without complication. Electronically signed by: Norleen Boxer MD 06/29/2024 01:28 PM  EST RP Workstation: [id number]     Procedures   Medications Ordered in the ED  ondansetron  (ZOFRAN ) injection 4 mg (4 mg Intravenous Given 06/29/24 1205)  fentaNYL  (SUBLIMAZE ) injection 50 mcg (50 mcg Intravenous Given 06/29/24 1205)  lactated ringers  bolus 500 mL (0 mLs Intravenous Stopped 06/29/24 1236)  iohexol  (OMNIPAQUE ) 350 MG/ML injection 100 mL (100 mLs Intravenous Contrast Given 06/29/24 1237)  famotidine  (PEPCID ) tablet 40 mg (40 mg Oral Given 06/29/24 1456)  sucralfate  (CARAFATE ) tablet 1 g (1 g Oral Given 06/29/24 1456)                                    Medical Decision Making Cardiac monitor interpretation:  Sinus bradycardia, no ectopy  Social determinants of health: Dementia  Patient here for right sided chest and abdominal and flank pain.  She is uncomfortable on arrival.  Is feeling much better after a dose of pain medication through the IV.  Was given fluids and Zofran  as well.  Workup largely negative.  Lab workup was unremarkable and CT angiogram of the chest abdomen pelvis showed no evidence of dissection or acute abnormality.  Patient had has an increase in regurgitation and GERD lately but is not on any antiacids.  Will treat her for possible gastritis.  Was given Pepcid  and Carafate  here as well and I will give her prescriptions for this.  She has an appoint with her primary care doctor Monday.  Advise close follow-up with them and her specialist and otherwise return to the ER for new or worsening symptoms.  All results and plan discussed with patient and her son at bedside.  They are agreeable with plan for discharge home.  Problems Addressed: Atypical chest pain: undiagnosed new problem with uncertain prognosis Gastritis without bleeding, unspecified chronicity, unspecified gastritis type: acute illness or injury Right flank pain: undiagnosed new problem with uncertain prognosis  Amount and/or Complexity of Data Reviewed Independent Historian: caregiver     Details: Son at bedside-helps to provide history as patient is in pain and moaning in the bed.  States that she woke up in her normal state of health and had her coffee and then started having severe pain External Data Reviewed: notes.    Details: Prior ED records reviewed-patient last seen 05/21/2024 for cancer pain Labs: ordered. Decision-making details documented in ED Course.    Details: Ordered and Reviewed by me and unremarkable Radiology: ordered and independent interpretation performed. Decision-making details documented in ED Course.    Details: Ordered and reviewed by me CT angiogram chest abdomen pelvis: Shows no acute abnormality ECG/medicine tests: ordered and independent interpretation performed. Decision-making details documented in ED Course.    Details: Ordered and interpreted by me in the absence of cardiology shows sinus rhythm, no STEMI or significant change when compared to prior EKG  Risk OTC drugs. Prescription drug management. Parenteral controlled substances. Drug therapy requiring intensive monitoring for toxicity. Diagnosis or treatment significantly limited by social determinants of health.     Final diagnoses:  Gastritis without bleeding, unspecified chronicity, unspecified gastritis type  Atypical chest pain  Right flank pain    ED Discharge Orders          Ordered    famotidine  (PEPCID ) 40 MG tablet  Daily        06/29/24 1440    sucralfate  (CARAFATE ) 1 g tablet  3 times daily        06/29/24 1440               Rosealynn Mateus, Duwaine L, DO 06/29/24 1552

## 2024-06-29 NOTE — ED Triage Notes (Signed)
 BIB family from home. Lives alone. Here for R breast pain. Onset today. Mentions some similar hx intermittent and ongoing for weeks, but today seems different. Pain appears severe. No rash noted. No known h/o shingles. Denies other sx. Some h/o dementia. BP elevated. Pinpoints to R breast and axillary flank area. Alert, NAD, moaning, interactive, resps e/u. Skin W&D.

## 2024-06-29 NOTE — Discharge Instructions (Addendum)
 Follow-up with your primary care doctor and your specialist.  Follow-up at your appointment on Monday as scheduled.  Take your Pepcid  and Carafate  as prescribed.  Stay on your other medications as prescribed.  Return to the ER for any new or worsening symptoms.

## 2024-06-29 NOTE — ED Notes (Signed)
 Pt to CT

## 2024-07-01 ENCOUNTER — Ambulatory Visit: Payer: Self-pay | Admitting: Adult Health

## 2024-07-01 NOTE — Progress Notes (Signed)
-    electrolytes and liver enzymes all normal -  no anemia

## 2024-07-02 ENCOUNTER — Inpatient Hospital Stay: Admitting: Oncology

## 2024-07-02 ENCOUNTER — Encounter: Payer: Self-pay | Admitting: Oncology

## 2024-07-02 ENCOUNTER — Inpatient Hospital Stay: Attending: Oncology

## 2024-07-02 VITALS — BP 137/61 | HR 63 | Temp 96.3°F | Resp 16 | Wt 90.0 lb

## 2024-07-02 DIAGNOSIS — Z923 Personal history of irradiation: Secondary | ICD-10-CM | POA: Insufficient documentation

## 2024-07-02 DIAGNOSIS — C221 Intrahepatic bile duct carcinoma: Secondary | ICD-10-CM | POA: Diagnosis present

## 2024-07-02 DIAGNOSIS — C240 Malignant neoplasm of extrahepatic bile duct: Secondary | ICD-10-CM | POA: Diagnosis not present

## 2024-07-02 DIAGNOSIS — Z9689 Presence of other specified functional implants: Secondary | ICD-10-CM | POA: Diagnosis not present

## 2024-07-02 DIAGNOSIS — R0789 Other chest pain: Secondary | ICD-10-CM | POA: Diagnosis not present

## 2024-07-02 DIAGNOSIS — R112 Nausea with vomiting, unspecified: Secondary | ICD-10-CM | POA: Insufficient documentation

## 2024-07-02 DIAGNOSIS — R739 Hyperglycemia, unspecified: Secondary | ICD-10-CM | POA: Insufficient documentation

## 2024-07-02 DIAGNOSIS — R978 Other abnormal tumor markers: Secondary | ICD-10-CM | POA: Diagnosis not present

## 2024-07-02 DIAGNOSIS — Z9221 Personal history of antineoplastic chemotherapy: Secondary | ICD-10-CM | POA: Diagnosis not present

## 2024-07-02 LAB — CBC WITH DIFFERENTIAL (CANCER CENTER ONLY)
Abs Immature Granulocytes: 0.01 K/uL (ref 0.00–0.07)
Basophils Absolute: 0.1 K/uL (ref 0.0–0.1)
Basophils Relative: 1 %
Eosinophils Absolute: 0.1 K/uL (ref 0.0–0.5)
Eosinophils Relative: 1 %
HCT: 36.7 % (ref 36.0–46.0)
Hemoglobin: 12.4 g/dL (ref 12.0–15.0)
Immature Granulocytes: 0 %
Lymphocytes Relative: 23 %
Lymphs Abs: 1.1 K/uL (ref 0.7–4.0)
MCH: 31.2 pg (ref 26.0–34.0)
MCHC: 33.8 g/dL (ref 30.0–36.0)
MCV: 92.2 fL (ref 80.0–100.0)
Monocytes Absolute: 0.4 K/uL (ref 0.1–1.0)
Monocytes Relative: 8 %
Neutro Abs: 3.2 K/uL (ref 1.7–7.7)
Neutrophils Relative %: 67 %
Platelet Count: 253 K/uL (ref 150–400)
RBC: 3.98 MIL/uL (ref 3.87–5.11)
RDW: 11.5 % (ref 11.5–15.5)
WBC Count: 4.8 K/uL (ref 4.0–10.5)
nRBC: 0 % (ref 0.0–0.2)

## 2024-07-02 LAB — CMP (CANCER CENTER ONLY)
ALT: 20 U/L (ref 0–44)
AST: 34 U/L (ref 15–41)
Albumin: 4.4 g/dL (ref 3.5–5.0)
Alkaline Phosphatase: 92 U/L (ref 38–126)
Anion gap: 11 (ref 5–15)
BUN: 18 mg/dL (ref 8–23)
CO2: 26 mmol/L (ref 22–32)
Calcium: 10.4 mg/dL — ABNORMAL HIGH (ref 8.9–10.3)
Chloride: 98 mmol/L (ref 98–111)
Creatinine: 0.83 mg/dL (ref 0.44–1.00)
GFR, Estimated: >60 mL/min (ref >60)
Glucose, Bld: 104 mg/dL — ABNORMAL HIGH (ref 70–99)
Potassium: 4.8 mmol/L (ref 3.5–5.1)
Sodium: 135 mmol/L (ref 135–145)
Total Bilirubin: 0.2 mg/dL (ref 0.0–1.2)
Total Protein: 7.6 g/dL (ref 6.5–8.1)

## 2024-07-02 LAB — CEA (ACCESS): CEA (CHCC): 3.07 ng/mL (ref 0.00–5.00)

## 2024-07-02 NOTE — Progress Notes (Signed)
 [facility]  ONCOLOGY CLINIC PROGRESS NOTE   Patient Care Team: [name], NP as PCP - General (Geriatric Medicine) Cleatus Collar, MD as Consulting Physician (Ophthalmology) Camillo Golas, MD as Consulting Physician (Ophthalmology) Winfred Curlee DEL, MD (Inactive) as Consulting Physician (Gynecology) Ardis Evalene CROME, RN as Oncology Nurse Navigator  PATIENT NAME: Caroline Snyder   MR#: [id number] DOB: 09-15-1940  Date of visit: 07/02/2024   ASSESSMENT & PLAN:   ADAMARY SAVARY is a 83 y.o.  lady with a past medical history of vascular dementia, HTN, HLD, depression, migraines and GERD, was referred to our clinic for newly diagnosed adenocarcinoma of the biliary duct.   Primary cholangiocarcinoma of extrahepatic bile duct (HCC) Please review oncology history for additional details and timeline of events.    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  IHC stain for H. pylori was positive.   On 12/22/2023 CT chest with contrast showed no evidence of intrathoracic metastatic disease.  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   She did have consultation with Dr. Dasie.  Dr. Dasie did offer surgery as an option but because of the local extent of the disease, she will need to undergo Whipple surgery.  Patient and family members would like for her to avoid surgery as they are worried about her postoperative recovery from Whipple surgery.  They opted to proceed with concurrent chemoradiation instead. Treatment aims to control disease, not cure.   She began concurrent chemoradiation with capecitabine  from 02/13/2024.  Capecitabine  was given at a dose of 800  mg/m twice daily on Monday to Friday during the course of radiation.  This translated to 1000 mg p.o. twice daily dosing for her. She completed treatments on 03/22/2024.  She underwent biliary stent exchange on 03/19/2024.  The stent is functioning well, with normal bilirubin and liver function tests.   She had to be directed to the ED for persistent abdominal pain and a CT scan on 04/11/2024 showed patent biliary stent and no evidence of disease progression.  It showed treatment-related changes.  She was taken to the ED by her family members on 06/29/2024 for nausea, vomiting and right sided chest pain.  CT chest, abdomen pelvis with angiogram was obtained which showed no evidence of acute process.  It also showed no evidence of residual disease.  She is scheduled for MRI for further evaluation of her biliary malignancy on 07/10/2024.  I will see her in clinic with those results.  No current concerns related to the stent or tumor progression based on recent imaging.  Clinically she is looking a lot better currently.  CEA is normal at 3 today.  CA 19-9 is pending from today.  LFTs are all normal.  CBCD unremarkable.  RTC in 2 weeks to discuss MRI results.  Nausea and vomiting Intermittent nausea and vomiting, previously severe enough to require ER visit. Symptoms have improved over the weekend. Possible muscle-related pain due to retching and vomiting.  Breast/chest wall pain and tingling Intermittent breast/chest wall pain and tingling, possibly related to muscle strain from retching and vomiting. No evidence of cancer recurrence on recent CT scan.  Hyperglycemia Elevated glucose levels, though normal during recent ER visit. Previous levels  consistently high. Etiology unclear. - Continue to monitor glucose levels  I reviewed lab results and outside records for this visit and discussed relevant results with the patient. Diagnosis, plan of care and treatment options were also discussed in  detail with the patient. Opportunity provided to ask questions and answers provided to her apparent satisfaction. Provided instructions to call our clinic with any problems, questions or concerns prior to return visit. I recommended to continue follow-up with PCP and sub-specialists. She verbalized understanding and agreed with the plan.   NCCN guidelines have been consulted in the planning of this patient's care.  I spent a total of 30 minutes during this encounter with the patient including review of chart and various tests results, discussions about plan of care and coordination of care plan.   Chinita Patten, MD  07/02/2024 5:48 PM  [facility] - A DEPT OF [facility] [id number] [place] [address] Dept: [phone] Dept Fax: [phone]    CHIEF COMPLAINT/ REASON FOR VISIT:   Cholangiocarcinoma of extrahepatic biliary duct  Current Treatment: Patient did not want to proceed with surgery.  Plan made to treat her with concurrent chemoradiation with capecitabine . She began concurrent chemoradiation with capecitabine  from 02/13/2024 and completed treatments on 03/22/2024.  INTERVAL HISTORY:    Discussed the use of AI scribe software for clinical note transcription with the patient, who gave verbal consent to proceed.  History of Present Illness  Tarin Johndrow Kallyn Demarcus is an 83 year old female with biliary cancer who presents with nausea and right sided breast/chest wall pain.  She has been experiencing nausea for a while, which intensified last week, leading to episodes of vomiting over a couple of days. The nausea has since subsided somewhat over the weekend.  She reports pain under her breast, a symptom she has experienced before. This pain prompted her son to take her to the emergency room on Friday, 06/21/2024. A CT scan of chest, abdomen and pelvis was performed at the ER, which showed no  acute findings.  Her blood work at the ER was mostly normal, with the exception of a slightly low sodium level. Her glucose levels have been high in the past, although they were normal during the ER visit. Historically, her blood work has shown abnormalities, so the normal results were unexpected.  She has a history of biliary cancer and has undergone treatment for it. Her CA 19-9 levels have decreased from 180 to 64, and her CEA levels have decreased from 7.29 to 4, which is within normal range. These markers were checked again today, but results are pending.  She has been eating three meals a day and has been observed pacing at night, waking up multiple times, which indicates some level of discomfort or restlessness.    I have reviewed the past medical history, past surgical history, social history and family history with the patient and they are unchanged from previous note.  HISTORY OF PRESENT ILLNESS:   ONCOLOGY HISTORY:   83 y.o. lady with a past medical history of vascular dementia, HTN, HLD, depression, migraines and GERD, was admitted to the hospital on 12/18/2023 after she presented with intractable nausea and vomiting.  She was noted to be hypertensive and tachypneic.  Labs showed  AST/ALT 300s, and Alk Phos 315. RUQ US  with CBD dilation.  She was admitted for further workup and evaluation.   On 12/18/2023, MRCP showed moderate to severe intrahepatic bile duct  dilatation.  There was an abrupt beak like narrowing of the mid common bile duct at the level of the head of the pancreas.  Findings concerning for stricture at the level of head of pancreas.  Mild diffuse edema within the pancreatic parenchyma noted, which was concerning for pancreatitis. Mild increase caliber of the main pancreatic duct which measured up to 4 mm at the level of the pancreatic neck, which abruptly decreases in caliber at the head of pancreas. Focal area of mild increased T2 signal with restricted diffusion in the  anterior head of pancreas measures 2.2 x 1.2 by 2.2 cm. Cannot exclude pancreatic head neoplasm. Consider further evaluation with endoscopic ultrasound and tissue sampling.   CA 19-9 was increased at 180 on 12/19/2023.   On 12/21/2023, Dr. Wilhelmenia performed ERCP.  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  Stricture was malignant appearing.  The upper third of the main bile duct, left main hepatic duct and right main hepatic duct were moderately dilated, secondary to a stricture.  Extremely difficult cannulation requiring attempt at double wire, over pancreatic stent, precut fistulotomy to eventually gain biliary access. One temporary plastic biliary stent was placed into the ventral pancreatic duct.  One plastic biliary stent was placed in the CBD to traverse the stricture.   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  IHC stain for H. pylori was positive.     On 12/22/2023 CT chest with contrast showed no evidence of intrathoracic metastatic disease.   Patient presented to clinic on 12/29/2023 to establish care with us .  She was in significant amount of abdominal/right chest wall pain and had to be directed to ED for further evaluation and admission.   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    She did have consultation with Dr. Dasie.  Dr. Dasie did offer surgery as an option but because of the local extent of the disease, she will need to undergo Whipple surgery.   Patient and family members would like for her to avoid surgery as they are worried about her postoperative recovery from Whipple surgery.  They would like to proceed with concurrent  chemoradiation instead.   She is scheduled to see Dr. Dewey later today and is scheduled to begin radiation treatment from 02/13/2024.  Will proceed with concurrent chemotherapy with dose reduced capecitabine  and will continue this during the course of radiation.  Dorothyann will be given a dose of 800 mg/m twice daily on Monday to Friday during the course of radiation.  This translated to 1000 mg p.o. twice daily dosing for her.   She began concurrent chemoradiation with capecitabine  from 02/13/2024. She completed treatments on 03/22/2024.  Restaging CT scan on 04/11/2024 showed no evidence of disease progression.  Repeat CT of the chest, abdomen pelvis on 06/29/2024 in the ER showed no evidence of residual disease.   REVIEW OF SYSTEMS:   Review of Systems  Musculoskeletal:  Positive for arthralgias.    All other pertinent systems were reviewed with the patient and are negative.  ALLERGIES: She is allergic to aricept  [donepezil ] and vioxx [rofecoxib].  MEDICATIONS:  Current Outpatient Medications  Medication Sig Dispense Refill  acetaminophen  (TYLENOL ) 500 MG tablet Take 500-1,000 mg by mouth every 8 (eight) hours as needed (for pain).     aspirin  EC 81 MG tablet Take 1 tablet (81 mg total) by mouth daily. Swallow whole. 30 tablet 11   Ensure (ENSURE) Take 237 mLs by mouth 2 (two) times daily between meals.     famotidine  (PEPCID ) 40 MG tablet Take 1 tablet (40 mg total) by mouth daily for 14 days. 14 tablet 0   hydrALAZINE  (APRESOLINE ) 10 MG tablet Take 1 tablet (10 mg total) by mouth 2 (two) times daily as needed. For SBP >160 20 tablet 0   losartan  (COZAAR ) 50 MG tablet Take 1 tablet (50 mg total) by mouth daily. 30 tablet 0   Multiple Vitamins-Minerals (ONE-A-DAY WOMENS VITACRAVES PO) Take by mouth daily.     ondansetron  (ZOFRAN ) 8 MG tablet Take 1 tablet (8 mg total) by mouth every 8 (eight) hours as needed for nausea or vomiting. 30 tablet 3   Probiotic Product (PROBIOTIC DAILY PO)  Take by mouth daily.     prochlorperazine  (COMPAZINE ) 10 MG tablet Take 0.5 tablets (5 mg total) by mouth every 6 (six) hours as needed for refractory nausea / vomiting. 15 tablet 0   rosuvastatin  (CRESTOR ) 20 MG tablet TAKE 1 TABLET BY MOUTH EVERY DAY 90 tablet 1   sucralfate  (CARAFATE ) 1 g tablet Take 1 tablet (1 g total) by mouth in the morning, at noon, and at bedtime for 14 days. 42 tablet 0   polyethylene glycol (MIRALAX / GLYCOLAX) 17 g packet Take 17 g by mouth every other day. (Patient not taking: Reported on 07/02/2024)     Current Facility-Administered Medications  Medication Dose Route Frequency Provider Last Rate Last Admin   denosumab  (PROLIA ) injection 60 mg  60 mg Subcutaneous Once Eubanks, Jessica K, NP         VITALS:   Blood pressure 137/61, pulse 63, temperature (!) 96.3 F (35.7 C), temperature source Temporal, resp. rate 16, weight 90 lb (40.8 kg), SpO2 100%.  Wt Readings from Last 3 Encounters:  07/02/24 90 lb (40.8 kg)  06/29/24 90 lb (40.8 kg)  06/26/24 90 lb 6.4 oz (41 kg)    Body mass index is 17.58 kg/m.    Onc Performance Status - 07/02/24 1459       ECOG Perf Status   ECOG Perf Status Restricted in physically strenuous activity but ambulatory and able to carry out work of a light or sedentary nature, e.g., light house work, office work      KPS SCALE   KPS % SCORE Able to carry on normal activity, minor s/s of disease            PHYSICAL EXAM:   Physical Exam Constitutional:      General: She is not in acute distress.    Appearance: Normal appearance.  HENT:     Head: Normocephalic and atraumatic.  Cardiovascular:     Rate and Rhythm: Normal rate.     Heart sounds: Normal heart sounds.  Pulmonary:     Effort: Pulmonary effort is normal. No respiratory distress.     Breath sounds: Normal breath sounds.  Abdominal:     General: There is no distension.  Neurological:     General: No focal deficit present.     Mental Status: She is  alert.       LABORATORY DATA:   I have reviewed the data as listed.  Results for orders placed or performed  in visit on 07/02/24  CEA (Access)  Result Value Ref Range   CEA ([facility]) 3.07 0.00 - 5.00 ng/mL  CMP (Cancer Center only)  Result Value Ref Range   Sodium 135 135 - 145 mmol/L   Potassium 4.8 3.5 - 5.1 mmol/L   Chloride 98 98 - 111 mmol/L   CO2 26 22 - 32 mmol/L   Glucose, Bld 104 (H) 70 - 99 mg/dL   BUN 18 8 - 23 mg/dL   Creatinine 9.16 9.55 - 1.00 mg/dL   Calcium  10.4 (H) 8.9 - 10.3 mg/dL   Total Protein 7.6 6.5 - 8.1 g/dL   Albumin 4.4 3.5 - 5.0 g/dL   AST 34 15 - 41 U/L   ALT 20 0 - 44 U/L   Alkaline Phosphatase 92 38 - 126 U/L   Total Bilirubin 0.2 0.0 - 1.2 mg/dL   GFR, Estimated >39 >39 mL/min   Anion gap 11 5 - 15  CBC with Differential (Cancer Center Only)  Result Value Ref Range   WBC Count 4.8 4.0 - 10.5 K/uL   RBC 3.98 3.87 - 5.11 MIL/uL   Hemoglobin 12.4 12.0 - 15.0 g/dL   HCT 63.2 63.9 - 53.9 %   MCV 92.2 80.0 - 100.0 fL   MCH 31.2 26.0 - 34.0 pg   MCHC 33.8 30.0 - 36.0 g/dL   RDW 88.4 88.4 - 84.4 %   Platelet Count 253 150 - 400 K/uL   nRBC 0.0 0.0 - 0.2 %   Neutrophils Relative % 67 %   Neutro Abs 3.2 1.7 - 7.7 K/uL   Lymphocytes Relative 23 %   Lymphs Abs 1.1 0.7 - 4.0 K/uL   Monocytes Relative 8 %   Monocytes Absolute 0.4 0.1 - 1.0 K/uL   Eosinophils Relative 1 %   Eosinophils Absolute 0.1 0.0 - 0.5 K/uL   Basophils Relative 1 %   Basophils Absolute 0.1 0.0 - 0.1 K/uL   Immature Granulocytes 0 %   Abs Immature Granulocytes 0.01 0.00 - 0.07 K/uL      RADIOGRAPHIC STUDIES:  I have personally reviewed the radiological images as listed and agree with the findings in the report.  CT Angio Chest/Abd/Pel for Dissection W and/or W/WO EXAM: CTA CHEST, ABDOMEN AND PELVIS WITH AND WITHOUT CONTRAST 06/29/2024 12:50:06 PM  TECHNIQUE: CTA of the chest was performed with and without the administration of intravenous contrast. CTA of the  abdomen and pelvis was performed with and without the administration of intravenous contrast. 100 mL of iohexol  (OMNIPAQUE ) 350 MG/ML injection was administered. Multiplanar reformatted images are provided for review. MIP images are provided for review. Automated exposure control, iterative reconstruction, and/or weight based adjustment of the mA/kV was utilized to reduce the radiation dose to as low as reasonably achievable.  COMPARISON: None available.  CLINICAL HISTORY: Chest pain. Concern for acute aortic syndrome. Additional history of biliary duct adenocarcinoma. * Tracking Code: BO *.  FINDINGS:  VASCULATURE: AORTA: Non-IV contrast imaging demonstrates no aortic intramural hematoma. Contrast images demonstrate no aortic dissection or aneurysm. No abdominal aortic aneurysm.  PULMONARY ARTERIES: No pulmonary embolism with the limits of this exam. No evidence of acute pulmonary aneurysm.  GREAT VESSELS OF AORTIC ARCH: No acute finding. No dissection. No arterial occlusion or significant stenosis.  CELIAC TRUNK: No acute finding. No occlusion or significant stenosis.  SUPERIOR MESENTERIC ARTERY: No acute finding. No occlusion or significant stenosis.  INFERIOR MESENTERIC ARTERY: No acute finding. No occlusion or significant stenosis.  RENAL  ARTERIES: No acute finding. No occlusion or significant stenosis.  ILIAC ARTERIES: No acute finding. No occlusion or significant stenosis.  CHEST: MEDIASTINUM: No mediastinal lymphadenopathy. The heart and pericardium demonstrate no acute abnormality.  LUNGS AND PLEURA: No suspicious pulmonary nodules. No evidence of pulmonary infarction or pneumonia. There is atelectasis in the right middle lobe and lingula. No evidence of pleural effusion or pneumothorax.  THORACIC BONES AND SOFT TISSUES: No acute bone or soft tissue abnormality. Small mass in the posterior right breast with biopsy clip. No acute findings of the right  breast. ABDOMEN AND PELVIS: LIVER: Pneumobilia in the left hepatic lobe. No enhancing hepatic lesion.  GALLBLADDER AND BILE DUCTS: Common bile duct stent in place. No biliary ductal dilatation. No evidence of biliary carcinoma recurrent or metastasis. Biliary stent in place without complication.  SPLEEN: The spleen is unremarkable.  PANCREAS: Atrophy of the pancreas is similar. No acute pancreatic findings.  ADRENAL GLANDS: Bilateral adrenal glands demonstrate no acute abnormality.  KIDNEYS, URETERS AND BLADDER: No stones in the kidneys or ureters. No hydronephrosis. No perinephric or periureteral stranding. Urinary bladder is unremarkable.  GI AND BOWEL: Appendix not identified. Several diverticula of the sigmoid colon without acute inflammation. Stomach and duodenal sweep demonstrate no acute abnormality. There is no bowel obstruction. No abnormal bowel wall thickening or distension.  REPRODUCTIVE: Reproductive organs are unremarkable.  PERITONEUM AND RETROPERITONEUM: No ascites or free air.  LYMPH NODES: No lymphadenopathy.  ABDOMINAL BONES AND SOFT TISSUES: No aggressive osseous lesion. No acute soft tissue abnormality.  IMPRESSION: 1. No evidence of acute aortic dissection or aneurysm. 2. No evidence of pulmonary embolism. 3. No acute pulmonary parenchymal findings. 4. No chest wall wall abnormality identified. 5. No evidence of biliary carcinoma recurrence or metastasis. 6. Biliary stent in place without complication.  Electronically signed by: Norleen Boxer MD 06/29/2024 01:28 PM EST RP Workstation: [id number]     CODE STATUS:  Code Status History     Date Active Date Inactive Code Status Order ID Comments User Context   12/18/2023 1558 12/23/2023 2029 Full Code [id number]  Georgina Basket, MD ED   09/12/2023 2330 09/13/2023 1728 Limited: Do not attempt resuscitation (DNR) -DNR-LIMITED -Do Not Intubate/DNI  [id number]  Lou Claretta HERO, MD ED   11/24/2013  1103 11/26/2013 2046 Full Code [id number]  Katrinka Garnette KIDD, MD Inpatient    Questions for Most Recent Historical Code Status (Order [id number])     Question Answer   By: Consent: discussion documented in EHR                Advance Directive Documentation    Flowsheet Row Most Recent Value  Type of Advance Directive Healthcare Power of Attorney, Living will  Pre-existing out of facility DNR order (yellow form or pink MOST form) --  MOST Form in Place? --    No orders of the defined types were placed in this encounter.    Future Appointments  Date Time Provider Department Center  07/10/2024  9:00 AM [facility] 1 [facility] [place]  07/16/2024  3:00 PM Nashonda Limberg, Chinita, MD [facility] None  08/21/2024 10:10 AM Mansouraty, Aloha Raddle., MD [facility]     This document was completed utilizing speech recognition software. Grammatical errors, random word insertions, pronoun errors, and incomplete sentences are an occasional consequence of this system due to software limitations, ambient noise, and hardware issues. Any formal questions or concerns about the content, text or information contained within the body of this dictation should be directly addressed to  the provider for clarification.

## 2024-07-02 NOTE — Assessment & Plan Note (Signed)
 Please review oncology history for additional details and timeline of events.    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  IHC stain for H. pylori was positive.   On 12/22/2023 CT chest with contrast showed no evidence of intrathoracic metastatic disease.  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   She did have consultation with Dr. Dasie.  Dr. Dasie did offer surgery as an option but because of the local extent of the disease, she will need to undergo Whipple surgery.  Patient and family members would like for her to avoid surgery as they are worried about her postoperative recovery from Whipple surgery.  They opted to proceed with concurrent chemoradiation instead. Treatment aims to control disease, not cure.   She began concurrent chemoradiation with capecitabine  from 02/13/2024.  Capecitabine  was given at a dose of 800 mg/m twice daily on Monday to Friday during the course of radiation.  This translated to 1000 mg p.o. twice daily dosing for her. She completed treatments on 03/22/2024.  She underwent biliary stent exchange on 03/19/2024.  The stent is functioning well, with normal bilirubin and liver function tests.   She had to be directed to the ED for persistent abdominal pain and a CT scan on 04/11/2024 showed patent biliary stent and no evidence of disease progression.  It showed treatment-related changes.  She was taken to the ED by her family members on 06/29/2024 for nausea, vomiting and right sided chest pain.  CT chest, abdomen pelvis with angiogram was obtained which showed no evidence of acute process.  It also showed no evidence of residual disease.  She is scheduled for MRI for  further evaluation of her biliary malignancy on 07/10/2024.  I will see her in clinic with those results.  No current concerns related to the stent or tumor progression based on recent imaging.  Clinically she is looking a lot better currently.  CEA is normal at 3 today.  CA 19-9 is pending from today.  LFTs are all normal.  CBCD unremarkable.  RTC in 2 weeks to discuss MRI results.

## 2024-07-03 ENCOUNTER — Encounter: Payer: Self-pay | Admitting: Nurse Practitioner

## 2024-07-03 LAB — CANCER ANTIGEN 19-9: CA 19-9: 55 U/mL — ABNORMAL HIGH (ref 0–35)

## 2024-07-03 MED ORDER — LOSARTAN POTASSIUM 50 MG PO TABS: 50.0000 mg | ORAL_TABLET | Freq: Every day | ORAL | 1 refills | Status: AC

## 2024-07-10 ENCOUNTER — Ambulatory Visit (HOSPITAL_COMMUNITY): Admission: RE | Admit: 2024-07-10 | Discharge: 2024-07-10 | Attending: Oncology

## 2024-07-10 ENCOUNTER — Other Ambulatory Visit: Payer: Self-pay | Admitting: Oncology

## 2024-07-10 DIAGNOSIS — C240 Malignant neoplasm of extrahepatic bile duct: Secondary | ICD-10-CM

## 2024-07-10 MED ORDER — GADOBUTROL 1 MMOL/ML IV SOLN
4.0000 mL | Freq: Once | INTRAVENOUS | Status: AC | PRN
  Administered 2024-07-10: 4 mL via INTRAVENOUS

## 2024-07-16 ENCOUNTER — Inpatient Hospital Stay: Admitting: Oncology

## 2024-07-16 VITALS — BP 98/61 | HR 72 | Temp 97.8°F | Resp 17 | Wt 92.6 lb

## 2024-07-16 DIAGNOSIS — C240 Malignant neoplasm of extrahepatic bile duct: Secondary | ICD-10-CM | POA: Diagnosis not present

## 2024-07-16 DIAGNOSIS — C221 Intrahepatic bile duct carcinoma: Secondary | ICD-10-CM | POA: Diagnosis not present

## 2024-07-16 NOTE — Progress Notes (Unsigned)
 [facility]  ONCOLOGY CLINIC PROGRESS NOTE   Patient Care Team: [name], NP as PCP - General (Geriatric Medicine) Cleatus Collar, MD as Consulting Physician (Ophthalmology) Camillo Golas, MD as Consulting Physician (Ophthalmology) Winfred Curlee DEL, MD (Inactive) as Consulting Physician (Gynecology) Ardis Evalene CROME, RN as Oncology Nurse Navigator  PATIENT NAME: Caroline Snyder   MR#: [id number] DOB: 1941/04/05  Date of visit: 07/16/2024   ASSESSMENT & PLAN:   CANDIES PALM is a 83 y.o.  lady with a past medical history of vascular dementia, HTN, HLD, depression, migraines and GERD, was referred to our clinic for newly diagnosed adenocarcinoma of the biliary duct.   No problem-specific Assessment & Plan notes found for this encounter.   Nausea and vomiting Intermittent nausea and vomiting, previously severe enough to require ER visit. Symptoms have improved over the weekend. Possible muscle-related pain due to retching and vomiting.  Breast/chest wall pain and tingling Intermittent breast/chest wall pain and tingling, possibly related to muscle strain from retching and vomiting. No evidence of cancer recurrence on recent CT scan.  Hyperglycemia Elevated glucose levels, though normal during recent ER visit. Previous levels consistently high. Etiology unclear. - Continue to monitor glucose levels  I reviewed lab results and outside records for this visit and discussed relevant results with the patient. Diagnosis, plan of care and treatment options were also discussed in detail with the patient. Opportunity provided to ask questions and answers provided to her apparent satisfaction. Provided instructions to call our clinic with any problems, questions or concerns prior to return visit. I recommended to continue follow-up with PCP and sub-specialists. She verbalized understanding and agreed with the plan.   NCCN guidelines have been consulted in  the planning of this patients care.  I spent a total of 30 minutes during this encounter with the patient including review of chart and various tests results, discussions about plan of care and coordination of care plan.   Chinita Patten, MD  07/16/2024 3:32 PM  [facility] - A DEPT OF [facility] [address] [place] [address] Dept: [phone] Dept Fax: [phone]    CHIEF COMPLAINT/ REASON FOR VISIT:   Cholangiocarcinoma of extrahepatic biliary duct  Current Treatment: Patient did not want to proceed with surgery.  Plan made to treat her with concurrent chemoradiation with capecitabine . She began concurrent chemoradiation with capecitabine  from 02/13/2024 and completed treatments on 03/22/2024.  INTERVAL HISTORY:    Discussed the use of AI scribe software for clinical note transcription with the patient, who gave verbal consent to proceed.  History of Present Illness  Caroline Snyder is an 83 year old female with biliary cancer who presents with nausea and right sided breast/chest wall pain.  She has been experiencing nausea for a while, which intensified last week, leading to episodes of vomiting over a couple of days. The nausea has since subsided somewhat over the weekend.  She reports pain under her breast, a symptom she has experienced before. This pain prompted her son to take her to the emergency room on Friday, 06/21/2024. A CT scan of chest, abdomen and pelvis was performed at the ER, which showed no acute findings.  Her blood work at the ER was mostly normal, with the exception of a slightly low sodium level. Her glucose levels have been high in the past, although they were normal during the ER visit. Historically, her blood work has shown  abnormalities, so the normal results were unexpected.  She has a history of biliary cancer and has undergone treatment for it. Her CA  19-9 levels have decreased from 180 to 64, and her CEA levels have decreased from 7.29 to 4, which is within normal range. These markers were checked again today, but results are pending.  She has been eating three meals a day and has been observed pacing at night, waking up multiple times, which indicates some level of discomfort or restlessness.    I have reviewed the past medical history, past surgical history, social history and family history with the patient and they are unchanged from previous note.  HISTORY OF PRESENT ILLNESS:   ONCOLOGY HISTORY:   83 y.o. lady with a past medical history of vascular dementia, HTN, HLD, depression, migraines and GERD, was admitted to the hospital on 12/18/2023 after she presented with intractable nausea and vomiting.  She was noted to be hypertensive and tachypneic.  Labs showed  AST/ALT 300s, and Alk Phos 315. RUQ US  with CBD dilation.  She was admitted for further workup and evaluation.   On 12/18/2023, MRCP showed moderate to severe intrahepatic bile duct dilatation.  There was an abrupt beak like narrowing of the mid common bile duct at the level of the head of the pancreas.  Findings concerning for stricture at the level of head of pancreas.  Mild diffuse edema within the pancreatic parenchyma noted, which was concerning for pancreatitis. Mild increase caliber of the main pancreatic duct which measured up to 4 mm at the level of the pancreatic neck, which abruptly decreases in caliber at the head of pancreas. Focal area of mild increased T2 signal with restricted diffusion in the anterior head of pancreas measures 2.2 x 1.2 by 2.2 cm. Cannot exclude pancreatic head neoplasm. Consider further evaluation with endoscopic ultrasound and tissue sampling.   CA 19-9 was increased at 180 on 12/19/2023.   On 12/21/2023, Dr. Wilhelmenia performed ERCP.  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  Stricture was malignant  appearing.  The upper third of the main bile duct, left main hepatic duct and right main hepatic duct were moderately dilated, secondary to a stricture.  Extremely difficult cannulation requiring attempt at double wire, over pancreatic stent, precut fistulotomy to eventually gain biliary access. One temporary plastic biliary stent was placed into the ventral pancreatic duct.  One plastic biliary stent was placed in the CBD to traverse the stricture.   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  IHC stain for H. pylori was positive.     On 12/22/2023 CT chest with contrast showed no evidence of intrathoracic metastatic disease.   Patient presented to clinic on 12/29/2023 to establish care with us .  She was in significant amount of abdominal/right chest wall pain and had to be directed to ED for further evaluation and admission.   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    She did have consultation with Dr. Dasie.  Dr. Dasie did offer surgery as an option but because of the local extent of the disease, she will need to undergo Whipple surgery.   Patient and family members would  like for her to avoid surgery as they are worried about her postoperative recovery from Whipple surgery.  They would like to proceed with concurrent chemoradiation instead.   She is scheduled to see Dr. Dewey later today and is scheduled to begin radiation treatment from 02/13/2024.  Will proceed with concurrent chemotherapy with dose reduced capecitabine  and will continue this during the course of radiation.  Dorothyann will be given a dose of 800 mg/m twice daily on Monday to Friday during the course of radiation.  This  translated to 1000 mg p.o. twice daily dosing for her.   She began concurrent chemoradiation with capecitabine  from 02/13/2024. She completed treatments on 03/22/2024.  Restaging CT scan on 04/11/2024 showed no evidence of disease progression.  Repeat CT of the chest, abdomen pelvis on 06/29/2024 in the ER showed no evidence of residual disease.   REVIEW OF SYSTEMS:   Review of Systems - Oncology  All other pertinent systems were reviewed with the patient and are negative.  ALLERGIES: She is allergic to aricept  [donepezil ] and vioxx [rofecoxib].  MEDICATIONS:  Current Outpatient Medications  Medication Sig Dispense Refill   acetaminophen  (TYLENOL ) 500 MG tablet Take 500-1,000 mg by mouth every 8 (eight) hours as needed (for pain).     aspirin  EC 81 MG tablet Take 1 tablet (81 mg total) by mouth daily. Swallow whole. 30 tablet 11   Ensure (ENSURE) Take 237 mLs by mouth 2 (two) times daily between meals.     famotidine  (PEPCID ) 40 MG tablet Take 1 tablet (40 mg total) by mouth daily for 14 days. 14 tablet 0   hydrALAZINE  (APRESOLINE ) 10 MG tablet Take 1 tablet (10 mg total) by mouth 2 (two) times daily as needed. For SBP >160 20 tablet 0   losartan  (COZAAR ) 50 MG tablet Take 1 tablet (50 mg total) by mouth daily. 90 tablet 1   Multiple Vitamins-Minerals (ONE-A-DAY WOMENS VITACRAVES PO) Take by mouth daily.     ondansetron  (ZOFRAN ) 8 MG tablet Take 1 tablet (8 mg total) by mouth every 8 (eight) hours as needed for nausea or vomiting. 30 tablet 3   polyethylene glycol (MIRALAX / GLYCOLAX) 17 g packet Take 17 g by mouth every other day.     Probiotic Product (PROBIOTIC DAILY PO) Take by mouth daily.     prochlorperazine  (COMPAZINE ) 10 MG tablet Take 0.5 tablets (5 mg total) by mouth every 6 (six) hours as needed for refractory nausea / vomiting. 15 tablet 0   rosuvastatin  (CRESTOR ) 20 MG tablet TAKE 1 TABLET BY MOUTH EVERY DAY 90 tablet 1   sucralfate  (CARAFATE ) 1 g tablet Take 1 tablet (1 g  total) by mouth in the morning, at noon, and at bedtime for 14 days. 42 tablet 0   Current Facility-Administered Medications  Medication Dose Route Frequency Provider Last Rate Last Admin   denosumab  (PROLIA ) injection 60 mg  60 mg Subcutaneous Once Eubanks, Jessica K, NP         VITALS:   Blood pressure 98/61, pulse 72, temperature 97.8 F (36.6 C), temperature source Temporal, resp. rate 17, weight 92 lb 9.6 oz (42 kg), SpO2 100%.  Wt Readings from Last 3 Encounters:  07/16/24 92 lb 9.6 oz (42 kg)  07/02/24 90 lb (40.8 kg)  06/29/24 90 lb (40.8 kg)    Body mass index is 18.08 kg/m.    Onc Performance Status - 07/16/24 1517       ECOG Perf Status   ECOG Perf Status Restricted in  physically strenuous activity but ambulatory and able to carry out work of a light or sedentary nature, e.g., light house work, office work      KPS SCALE   KPS % SCORE Able to carry on normal activity, minor s/s of disease            PHYSICAL EXAM:   Physical Exam Constitutional:      General: She is not in acute distress.    Appearance: Normal appearance.  HENT:     Head: Normocephalic and atraumatic.  Cardiovascular:     Rate and Rhythm: Normal rate.     Heart sounds: Normal heart sounds.  Pulmonary:     Effort: Pulmonary effort is normal. No respiratory distress.     Breath sounds: Normal breath sounds.  Abdominal:     General: There is no distension.  Neurological:     General: No focal deficit present.     Mental Status: She is alert.       LABORATORY DATA:   I have reviewed the data as listed.  No results found for any visits on 07/16/24.  Last CBC Lab Results  Component Value Date   WBC 4.8 07/02/2024   HGB 12.4 07/02/2024   HCT 36.7 07/02/2024   MCV 92.2 07/02/2024   MCH 31.2 07/02/2024   RDW 11.5 07/02/2024   PLT 253 07/02/2024     Chemistry      Component Value Date/Time   NA 135 07/02/2024 1432   NA 139 01/29/2017 1126   K 4.8 07/02/2024 1432   CL  98 07/02/2024 1432   CO2 26 07/02/2024 1432   BUN 18 07/02/2024 1432   BUN 17 01/29/2017 1126   CREATININE 0.83 07/02/2024 1432   CREATININE 0.87 06/26/2024 1522      Component Value Date/Time   CALCIUM  10.4 (H) 07/02/2024 1432   ALKPHOS 92 07/02/2024 1432   AST 34 07/02/2024 1432   ALT 20 07/02/2024 1432   BILITOT 0.2 07/02/2024 1432       RADIOGRAPHIC STUDIES:  I have personally reviewed the radiological images as listed and agree with the findings in the report.  MR 3D Recon At Scanner CLINICAL DATA:  History of biliary tract cancer/cholangiocarcinoma, status post chemoradiation. Needs restaging imaging. Pain.  EXAM: MRI ABDOMEN WITHOUT AND WITH CONTRAST (INCLUDING MRCP)  TECHNIQUE: Multiplanar multisequence MR imaging of the abdomen was performed both before and after the administration of intravenous contrast. Heavily T2-weighted images of the biliary and pancreatic ducts were obtained, and three-dimensional MRCP images were rendered by post processing.  CONTRAST:  4mL GADAVIST  GADOBUTROL  1 MMOL/ML IV SOLN  COMPARISON:  CT angiography chest, abdomen and pelvis from 06/29/2024 and MRI abdomen from 12/18/2023.  FINDINGS: Lower chest: Unremarkable MR appearance to the lung bases. No pleural effusion. No pericardial effusion. Normal heart size.  Hepatobiliary: The liver is normal in size. Noncirrhotic configuration. No focal liver lesion. No intrahepatic bile duct dilation. Redemonstration of stent in the extrahepatic bile duct which exhibit air-fluid level. No discrete obstructing mass or calculus seen. Unremarkable gallbladder.  Pancreas: Mild-to-moderate diffuse atrophy of pancreas. No focal lesion seen. Main pancreatic duct is not dilated. There is subtle fat stranding surrounding the pancreatic head, similar to the recent prior study. Findings are nonspecific and may represent sequela of chronic versus acute pancreatitis. Correlate clinically and  with serum lipase levels.  Spleen:  Within normal limits in size and appearance. No focal mass.  Adrenals/Urinary Tract: Unremarkable adrenal glands. No hydroureteronephrosis. No suspicious  renal mass. There are scattered, subcentimeter sized simple cortical and sinus cysts in bilateral kidneys.  Stomach/Bowel: Visualized portions within the abdomen are unremarkable. No disproportionate dilation of bowel loops.  Vascular/Lymphatic: No pathologically enlarged lymph nodes identified. No abdominal aortic aneurysm demonstrated. No ascites.  Other:  None.  Musculoskeletal: No suspicious bone lesions identified.  IMPRESSION: 1. No discrete obstructing mass or calculus seen in the extrahepatic bile duct. No intrahepatic bile duct dilation. 2. There is subtle fat stranding surrounding the pancreatic head, similar to the recent prior study. Findings are nonspecific and may represent sequela of chronic versus acute pancreatitis. Correlate clinically and with serum lipase levels. 3. Multiple other nonacute observations, as described above.  Electronically Signed   By: Ree Molt M.D.   On: 07/10/2024 10:40 MR ABDOMEN MRCP W WO CONTAST CLINICAL DATA:  History of biliary tract cancer/cholangiocarcinoma, status post chemoradiation. Needs restaging imaging. Pain.  EXAM: MRI ABDOMEN WITHOUT AND WITH CONTRAST (INCLUDING MRCP)  TECHNIQUE: Multiplanar multisequence MR imaging of the abdomen was performed both before and after the administration of intravenous contrast. Heavily T2-weighted images of the biliary and pancreatic ducts were obtained, and three-dimensional MRCP images were rendered by post processing.  CONTRAST:  4mL GADAVIST  GADOBUTROL  1 MMOL/ML IV SOLN  COMPARISON:  CT angiography chest, abdomen and pelvis from 06/29/2024 and MRI abdomen from 12/18/2023.  FINDINGS: Lower chest: Unremarkable MR appearance to the lung bases. No pleural effusion. No pericardial  effusion. Normal heart size.  Hepatobiliary: The liver is normal in size. Noncirrhotic configuration. No focal liver lesion. No intrahepatic bile duct dilation. Redemonstration of stent in the extrahepatic bile duct which exhibit air-fluid level. No discrete obstructing mass or calculus seen. Unremarkable gallbladder.  Pancreas: Mild-to-moderate diffuse atrophy of pancreas. No focal lesion seen. Main pancreatic duct is not dilated. There is subtle fat stranding surrounding the pancreatic head, similar to the recent prior study. Findings are nonspecific and may represent sequela of chronic versus acute pancreatitis. Correlate clinically and with serum lipase levels.  Spleen:  Within normal limits in size and appearance. No focal mass.  Adrenals/Urinary Tract: Unremarkable adrenal glands. No hydroureteronephrosis. No suspicious renal mass. There are scattered, subcentimeter sized simple cortical and sinus cysts in bilateral kidneys.  Stomach/Bowel: Visualized portions within the abdomen are unremarkable. No disproportionate dilation of bowel loops.  Vascular/Lymphatic: No pathologically enlarged lymph nodes identified. No abdominal aortic aneurysm demonstrated. No ascites.  Other:  None.  Musculoskeletal: No suspicious bone lesions identified.  IMPRESSION: 1. No discrete obstructing mass or calculus seen in the extrahepatic bile duct. No intrahepatic bile duct dilation. 2. There is subtle fat stranding surrounding the pancreatic head, similar to the recent prior study. Findings are nonspecific and may represent sequela of chronic versus acute pancreatitis. Correlate clinically and with serum lipase levels. 3. Multiple other nonacute observations, as described above.  Electronically Signed   By: Ree Molt M.D.   On: 07/10/2024 10:40    CODE STATUS:  Code Status History     Date Active Date Inactive Code Status Order ID Comments User Context   12/18/2023 1558  12/23/2023 2029 Full Code [id number]  Georgina Basket, MD ED   09/12/2023 2330 09/13/2023 1728 Limited: Do not attempt resuscitation (DNR) -DNR-LIMITED -Do Not Intubate/DNI  [id number]  Lou Claretta HERO, MD ED   11/24/2013 1103 11/26/2013 2046 Full Code [id number]  Katrinka Garnette KIDD, MD Inpatient    Questions for Most Recent Historical Code Status (Order [id number])     Question Answer   By: Consent:  discussion documented in EHR                Advance Directive Documentation    Flowsheet Row Most Recent Value  Type of Advance Directive Healthcare Power of Attorney, Living will  Pre-existing out of facility DNR order (yellow form or pink MOST form) --  MOST Form in Place? --    No orders of the defined types were placed in this encounter.    Future Appointments  Date Time Provider Department Center  08/21/2024 10:10 AM Mansouraty, Aloha Raddle., MD [facility]     This document was completed utilizing speech recognition software. Grammatical errors, random word insertions, pronoun errors, and incomplete sentences are an occasional consequence of this system due to software limitations, ambient noise, and hardware issues. Any formal questions or concerns about the content, text or information contained within the body of this dictation should be directly addressed to the provider for clarification.

## 2024-07-17 ENCOUNTER — Emergency Department (HOSPITAL_COMMUNITY)

## 2024-07-17 ENCOUNTER — Encounter: Payer: Self-pay | Admitting: Oncology

## 2024-07-17 ENCOUNTER — Emergency Department (HOSPITAL_COMMUNITY)
Admission: EM | Admit: 2024-07-17 | Discharge: 2024-07-17 | Disposition: A | Discharge status: Home / Self Care | Attending: Emergency Medicine | Admitting: Emergency Medicine

## 2024-07-17 DIAGNOSIS — Z7982 Long term (current) use of aspirin: Secondary | ICD-10-CM | POA: Diagnosis not present

## 2024-07-17 DIAGNOSIS — R1011 Right upper quadrant pain: Secondary | ICD-10-CM | POA: Diagnosis not present

## 2024-07-17 DIAGNOSIS — Z8509 Personal history of malignant neoplasm of other digestive organs: Secondary | ICD-10-CM | POA: Diagnosis not present

## 2024-07-17 DIAGNOSIS — Z79899 Other long term (current) drug therapy: Secondary | ICD-10-CM | POA: Diagnosis not present

## 2024-07-17 DIAGNOSIS — R110 Nausea: Secondary | ICD-10-CM | POA: Diagnosis not present

## 2024-07-17 DIAGNOSIS — F0150 Vascular dementia without behavioral disturbance: Secondary | ICD-10-CM | POA: Diagnosis not present

## 2024-07-17 DIAGNOSIS — R10A1 Flank pain, right side: Secondary | ICD-10-CM

## 2024-07-17 DIAGNOSIS — Z85038 Personal history of other malignant neoplasm of large intestine: Secondary | ICD-10-CM | POA: Diagnosis not present

## 2024-07-17 DIAGNOSIS — I10 Essential (primary) hypertension: Secondary | ICD-10-CM | POA: Diagnosis not present

## 2024-07-17 LAB — CBC WITH DIFFERENTIAL/PLATELET
Abs Immature Granulocytes: 0.01 K/uL (ref 0.00–0.07)
Basophils Absolute: 0.1 K/uL (ref 0.0–0.1)
Basophils Relative: 1 %
Eosinophils Absolute: 0.1 K/uL (ref 0.0–0.5)
Eosinophils Relative: 1 %
HCT: 34.9 % — ABNORMAL LOW (ref 36.0–46.0)
Hemoglobin: 11.5 g/dL — ABNORMAL LOW (ref 12.0–15.0)
Immature Granulocytes: 0 %
Lymphocytes Relative: 12 %
Lymphs Abs: 0.7 K/uL (ref 0.7–4.0)
MCH: 30.8 pg (ref 26.0–34.0)
MCHC: 33 g/dL (ref 30.0–36.0)
MCV: 93.6 fL (ref 80.0–100.0)
Monocytes Absolute: 0.5 K/uL (ref 0.1–1.0)
Monocytes Relative: 9 %
Neutro Abs: 4 K/uL (ref 1.7–7.7)
Neutrophils Relative %: 77 %
Platelets: 250 K/uL (ref 150–400)
RBC: 3.73 MIL/uL — ABNORMAL LOW (ref 3.87–5.11)
RDW: 11.9 % (ref 11.5–15.5)
WBC: 5.3 K/uL (ref 4.0–10.5)
nRBC: 0 % (ref 0.0–0.2)

## 2024-07-17 LAB — LIPASE, BLOOD: Lipase: 33 U/L (ref 11–51)

## 2024-07-17 LAB — COMPREHENSIVE METABOLIC PANEL WITH GFR
ALT: 25 U/L (ref 0–44)
AST: 34 U/L (ref 15–41)
Albumin: 4.1 g/dL (ref 3.5–5.0)
Alkaline Phosphatase: 80 U/L (ref 38–126)
Anion gap: 9 (ref 5–15)
BUN: 20 mg/dL (ref 8–23)
CO2: 27 mmol/L (ref 22–32)
Calcium: 9.7 mg/dL (ref 8.9–10.3)
Chloride: 100 mmol/L (ref 98–111)
Creatinine, Ser: 0.8 mg/dL (ref 0.44–1.00)
GFR, Estimated: >60 mL/min (ref >60)
Glucose, Bld: 113 mg/dL — ABNORMAL HIGH (ref 70–99)
Potassium: 4.5 mmol/L (ref 3.5–5.1)
Sodium: 136 mmol/L (ref 135–145)
Total Bilirubin: <0.2 mg/dL (ref 0.0–1.2)
Total Protein: 6.8 g/dL (ref 6.5–8.1)

## 2024-07-17 LAB — I-STAT CHEM 8, ED
BUN: 25 mg/dL — ABNORMAL HIGH (ref 8–23)
Calcium, Ion: 1.15 mmol/L (ref 1.15–1.40)
Chloride: 100 mmol/L (ref 98–111)
Creatinine, Ser: 0.9 mg/dL (ref 0.44–1.00)
Glucose, Bld: 106 mg/dL — ABNORMAL HIGH (ref 70–99)
HCT: 27 % — ABNORMAL LOW (ref 36.0–46.0)
Hemoglobin: 9.2 g/dL — ABNORMAL LOW (ref 12.0–15.0)
Potassium: 4.8 mmol/L (ref 3.5–5.1)
Sodium: 136 mmol/L (ref 135–145)
TCO2: 26 mmol/L (ref 22–32)

## 2024-07-17 MED ORDER — ONDANSETRON HCL 4 MG/2ML IJ SOLN
4.0000 mg | Freq: Once | INTRAMUSCULAR | Status: AC
  Administered 2024-07-17: 18:00:00 4 mg via INTRAVENOUS
  Filled 2024-07-17: qty 2

## 2024-07-17 MED ORDER — SODIUM CHLORIDE 0.9 % IV BOLUS
1000.0000 mL | Freq: Once | INTRAVENOUS | Status: AC
  Administered 2024-07-17: 19:00:00 1000 mL via INTRAVENOUS

## 2024-07-17 MED ORDER — OXYCODONE HCL 5 MG PO TABS
5.0000 mg | ORAL_TABLET | Freq: Once | ORAL | Status: AC
  Administered 2024-07-17: 20:00:00 5 mg via ORAL
  Filled 2024-07-17: qty 1

## 2024-07-17 MED ORDER — IOHEXOL 300 MG/ML  SOLN: 80.0000 mL | Freq: Once | INTRAMUSCULAR | Status: DC | PRN

## 2024-07-17 NOTE — ED Provider Notes (Signed)
 [place] EMERGENCY DEPARTMENT AT [facility] Provider Note  CSN: [id number] Arrival date & time: 07/17/24 1703  Chief Complaint(s) Flank Pain and Abdominal Pain  HPI Caroline Snyder is a 84 y.o. 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  Patient reported that she had nausea today, had pain earlier but it is resolved. No urinary symptoms. Her grandson gave her a dose of oxycodone .  She denies any vomiting or fevers.  History is somewhat limited due to dementia.  She is not 100% sure why she is in the hospital.  Discussed with family who report that the symptoms seem very chronic.  She takes oxycodone  for the her symptoms at home.  Her family was concerned because her blood pressure is up to 200 so they gave her her as needed blood pressure medication.  They are concerned that she needs better blood pressure control and she does not have the ability to manage as needed blood pressure medication.   Past Medical History Past Medical History:  Diagnosis Date   Abnormal CT of the chest 11/24/2013   See CT chest 11/22/13  1. No CT evidence of pulmonary arterial embolic disease.   2. Enlarged lymph nodes in the right hilar and subcarinal region.   These may represent reactive lymph nodes, clinical correlation   recommended. There is otherwise no evidence of mediastinal or   parenchymal masses or nodules nor infiltrates.   3. 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     Depression 05/05/2007   External  hemorrhoids without mention of complication 03/24/1999   GERD (gastroesophageal reflux disease) 08/02/1998   Hyperlipidemia 06/29/2006   Hypertensive emergency 09/13/2023   Memory loss 04/15/2003   Migraine without aura, without mention of intractable migraine without mention of status migrainosus 03/24/1999   Osteoporosis, unspecified 04/15/2004   Tear film insufficiency, unspecified 03/24/1999   Unspecified essential hypertension 11/22/2007   Patient Active Problem List   Diagnosis Date Noted   Right-sided posterior chest wall pain 04/11/2024   Protein-calorie malnutrition, severe 03/26/2024   Cancer associated pain 03/26/2024   Hypertensive urgency 03/23/2024   Nausea vomiting and diarrhea 03/23/2024   Hypomagnesemia 03/18/2024   Hypophosphatemia 03/18/2024   Hypokalemia 03/18/2024   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 Authorizing Provider  acetaminophen  (TYLENOL ) 500 MG tablet Take 500-1,000 mg by mouth every 8 (eight) hours as needed  (for pain).    [provider]  aspirin  EC 81 MG tablet Take 1 tablet (81 mg total) by mouth daily. Swallow whole. 10/31/20   Eubanks, Jessica K, NP  Ensure (ENSURE) Take 237 mLs by mouth 2 (two) times daily between meals.    [provider]  famotidine  (PEPCID ) 40 MG tablet Take 1 tablet (40 mg total) by mouth daily for 14 days. 06/29/24 07/16/24  Kammerer, Megan L, DO  hydrALAZINE  (APRESOLINE ) 10 MG tablet Take 1 tablet (10 mg total) by mouth 2 (two) times daily as needed. For SBP >160 06/26/24   [name], Monina C, NP  losartan  (COZAAR ) 50 MG tablet Take 1 tablet (50 mg total) by mouth daily. 07/03/24   [name], NP  Multiple Vitamins-Minerals (ONE-A-DAY WOMENS VITACRAVES PO) Take by mouth daily.    [provider]  ondansetron  (ZOFRAN ) 8 MG tablet Take 1 tablet (8 mg total) by mouth every 8 (eight) hours as needed for nausea or vomiting. 06/26/24   [name], Monina C, NP  polyethylene glycol (MIRALAX / GLYCOLAX) 17 g packet Take 17 g by mouth every other day.    [provider]  Probiotic Product (PROBIOTIC DAILY PO) Take by mouth daily.    [provider]  prochlorperazine  (COMPAZINE ) 10 MG tablet Take 0.5 tablets (5 mg total) by mouth every 6 (six) hours as needed for refractory nausea / vomiting. 03/22/24   Cindy Garnette POUR, MD  rosuvastatin  (CRESTOR ) 20 MG tablet TAKE 1 TABLET BY MOUTH EVERY DAY 04/25/24   Eubanks, Jessica K, NP  sucralfate  (CARAFATE ) 1 g tablet Take 1 tablet (1 g total) by mouth in the morning, at noon, and at bedtime for 14 days. 06/29/24 07/16/24  Gennaro Duwaine CROME, DO                                                                                                                                    Past Surgical History Past Surgical History:  Procedure Laterality Date   COLONOSCOPY  06-16-2007   Internal hemorrhoids,laxity of anal sphincter,polyp at 25cm form anal verge, tortuous sigmoid colon. Dr.Orr    ERCP  N/A 12/21/2023   Procedure: ERCP, WITH INTERVENTION IF INDICATED;  Surgeon: Wilhelmenia Aloha Raddle., MD;  Location: [facility] ENDOSCOPY;  Service: Gastroenterology;  Laterality: N/A;   ERCP N/A 03/19/2024   Procedure: ERCP, WITH INTERVENTION IF INDICATED;  Surgeon: Wilhelmenia Aloha Raddle., MD;  Location: [facility] ENDOSCOPY;  Service: Gastroenterology;  Laterality: N/A;   ESOPHAGOGASTRODUODENOSCOPY N/A 12/21/2023   Procedure: EGD (ESOPHAGOGASTRODUODENOSCOPY);  Surgeon: Wilhelmenia Aloha Raddle., MD;  Location: [facility] ENDOSCOPY;  Service: Gastroenterology;  Laterality: N/A;   EUS N/A 12/21/2023  Procedure: ULTRASOUND, UPPER GI TRACT, ENDOSCOPIC;  Surgeon: Mansouraty, Aloha Raddle., MD;  Location: [facility] ENDOSCOPY;  Service: Gastroenterology;  Laterality: N/A;   EYE SURGERY Bilateral 2010   cataract Dr. Camillo   FINE NEEDLE ASPIRATION  12/21/2023   Procedure: FINE NEEDLE ASPIRATION;  Surgeon: Wilhelmenia Aloha Raddle., MD;  Location: [facility] ENDOSCOPY;  Service: Gastroenterology;;   VAGINAL HYSTERECTOMY  1980   Family History Family History  Problem Relation Age of Onset   Alzheimer's disease Mother    Diabetes Mother    Transient ischemic attack Mother    Heart disease Mother        MI, CHF   Alcohol  abuse Father    Kidney disease Father    Hypertension Sister    Hypertension Sister    Hypertension Brother    Heart disease Brother    Hypertension Brother    Hypertension Brother    Other Son        truck accident 07/2016   Colon cancer Neg Hx    Stomach cancer Neg Hx    Esophageal cancer Neg Hx    Pancreatic cancer Neg Hx     Social History Social History[1] Allergies Aricept  [donepezil ] and Vioxx [rofecoxib]  Review of Systems Review of Systems  All other systems reviewed and are negative.   Physical Exam Vital Signs  I have reviewed the triage vital signs BP (!) 150/78   Pulse 75   Temp 98 F (36.7 C) (Oral)   Resp 17   SpO2 100%  Physical Exam Vitals and nursing note reviewed.  Constitutional:       General: She is not in acute distress.    Appearance: She is well-developed.  HENT:     Head: Normocephalic and atraumatic.     Mouth/Throat:     Mouth: Mucous membranes are moist.  Eyes:     Pupils: Pupils are equal, round, and reactive to light.  Cardiovascular:     Rate and Rhythm: Normal rate and regular rhythm.     Heart sounds: No murmur heard. Pulmonary:     Effort: Pulmonary effort is normal. No respiratory distress.     Breath sounds: Normal breath sounds.  Abdominal:     General: Abdomen is flat.     Palpations: Abdomen is soft.     Tenderness: There is abdominal tenderness (minimal RUQ).  Musculoskeletal:        General: No tenderness.     Right lower leg: No edema.     Left lower leg: No edema.  Skin:    General: Skin is warm and dry.  Neurological:     General: No focal deficit present.     Mental Status: She is alert. Mental status is at baseline.     Comments: No facial droop, follows commands in all 4 extremities  Psychiatric:        Mood and Affect: Mood normal.        Behavior: Behavior normal.     ED Results and Treatments Labs (all labs ordered are listed, but only abnormal results are displayed) Labs Reviewed  COMPREHENSIVE METABOLIC PANEL WITH GFR - Abnormal; Notable for the following components:      Result Value   Glucose, Bld 113 (*)    All other components within normal limits  CBC WITH DIFFERENTIAL/PLATELET - Abnormal; Notable for the following components:   RBC 3.73 (*)    Hemoglobin 11.5 (*)    HCT 34.9 (*)    All other components within normal limits  I-STAT CHEM 8, ED - Abnormal; Notable for the following components:   BUN 25 (*)    Glucose, Bld 106 (*)    Hemoglobin 9.2 (*)    HCT 27.0 (*)    All other components within normal limits  LIPASE, BLOOD                                                                                                                          Radiology No results found.  Pertinent labs & imaging  results that were available during my care of the patient were reviewed by me and considered in my medical decision making (see MDM for details).  Medications Ordered in ED Medications  iohexol  (OMNIPAQUE ) 300 MG/ML solution 80 mL (has no administration in time range)  sodium chloride  0.9 % bolus 1,000 mL (1,000 mLs Intravenous New Bag/Given 07/17/24 1847)  ondansetron  (ZOFRAN ) injection 4 mg (4 mg Intravenous Given 07/17/24 1826)  oxyCODONE  (Oxy IR/ROXICODONE ) immediate release tablet 5 mg (5 mg Oral Given 07/17/24 2013)                                                                                                                                     Procedures Procedures  (including critical care time)  Medical Decision Making / ED Course   MDM:  83 year old presenting with nausea, abdominal pain.  In emergency department, patient is overall well-appearing.  Complained of some ongoing nausea which resolved after receiving ondansetron .  Did have some minimal right upper quadrant tenderness.  Per family, this seems very much consistent with her chronic pain and chronic nausea status post treatment for biliary cancer.  Given her age offered to obtain further testing such as repeat CT scan however family declined.  They report that she had an MRI recently which was reassuring.  Labs are reassuring without abnormal LFTs or hyperbilirubinemia suggestive of acute biliary process.  They were more concerned with her elevated blood pressure which has improved after they gave an extra dose of blood pressure medication.  Her primary doctor has her on a regimen where she is supposed to take a as needed blood pressure medication which is probably too confusing for elderly demented patient so recommended follow-up with them for further adjustment.  Will discharge patient to home. All questions answered. Patient and family comfortable with plan of discharge. Return precautions discussed with patient and  specified on  the after visit summary.        Additional history obtained: -Additional history obtained from ems -External records from outside source obtained and reviewed including: Chart review including previous notes, labs, imaging, consultation notes including prior notes    Lab Tests: -I ordered, reviewed, and interpreted labs.   The pertinent results include:   Labs Reviewed  COMPREHENSIVE METABOLIC PANEL WITH GFR - Abnormal; Notable for the following components:      Result Value   Glucose, Bld 113 (*)    All other components within normal limits  CBC WITH DIFFERENTIAL/PLATELET - Abnormal; Notable for the following components:   RBC 3.73 (*)    Hemoglobin 11.5 (*)    HCT 34.9 (*)    All other components within normal limits  I-STAT CHEM 8, ED - Abnormal; Notable for the following components:   BUN 25 (*)    Glucose, Bld 106 (*)    Hemoglobin 9.2 (*)    HCT 27.0 (*)    All other components within normal limits  LIPASE, BLOOD    Notable for mild anemia   EKG   EKG Interpretation Date/Time:  Tuesday July 17 2024 18:36:48 EST Ventricular Rate:  62 PR Interval:  158 QRS Duration:  92 QT Interval:  428 QTC Calculation: 434 R Axis:   33  Text Interpretation: Normal sinus rhythm Incomplete right bundle branch block Borderline ECG Confirmed by Francesca Fallow ([id number]) on 07/17/2024 8:05:00 PM          Medicines ordered and prescription drug management: Meds ordered this encounter  Medications   sodium chloride  0.9 % bolus 1,000 mL   ondansetron  (ZOFRAN ) injection 4 mg   iohexol  (OMNIPAQUE ) 300 MG/ML solution 80 mL   oxyCODONE  (Oxy IR/ROXICODONE ) immediate release tablet 5 mg    Refill:  0    -I have reviewed the patients home medicines and have made adjustments as needed    Reevaluation: After the interventions noted above, I reevaluated the patient and found that their symptoms have improved  Co morbidities that complicate the patient  evaluation  Past Medical History:  Diagnosis Date   Abnormal CT of the chest 11/24/2013   See CT chest 11/22/13  1. No CT evidence of pulmonary arterial embolic disease.   2. Enlarged lymph nodes in the right hilar and subcarinal region.   These may represent reactive lymph nodes, clinical correlation   recommended. There is otherwise no evidence of mediastinal or   parenchymal masses or nodules nor infiltrates.   3. 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     Depression 05/05/2007   External hemorrhoids without mention of complication 03/24/1999   GERD (gastroesophageal reflux disease) 08/02/1998   Hyperlipidemia 06/29/2006   Hypertensive emergency 09/13/2023   Memory loss 04/15/2003   Migraine without aura, without mention of intractable migraine without mention of status migrainosus 03/24/1999   Osteoporosis, unspecified 04/15/2004   Tear film insufficiency, unspecified 03/24/1999   Unspecified essential hypertension 11/22/2007      Dispostion: Disposition decision including need for hospitalization was considered, and patient discharged from emergency department.  Final Clinical Impression(s) / ED Diagnoses Final diagnoses:  Right flank pain     This chart was dictated using voice recognition software.  Despite best efforts to proofread,  errors can occur which can change the documentation meaning.     [1]  Social History Tobacco Use   Smoking status: Never   Smokeless tobacco: Never  Vaping Use   Vaping status: Never Used  Substance Use Topics   Alcohol  use: No    Alcohol /week:  0.0 standard drinks of alcohol    Drug use: No     Francesca Elsie CROME, MD 07/17/24 2028

## 2024-07-17 NOTE — ED Triage Notes (Signed)
 Pt arrives from ems due to right flank pain and abdominal pain Hx bowel duct cancer, dementia baseline a/o x2 Pt 10 on scale 0-10 after grandson gave her pain medication oxycodone  5mg   pain was relieved pt now states pain 3 on scale 0-10

## 2024-07-17 NOTE — Discharge Instructions (Addendum)
 We evaluated you for your nausea.  Your testing in the emergency department is reassuring.  Please continue to take your nausea medicine as needed at home for your symptoms.  Please follow-up closely with your oncologist and your primary doctor for management of your blood pressure and pain symptoms.  If you have any new or worsening symptoms such as severe worsening pain not controlled with home medication, vomiting, fevers, or any other new symptoms, please return to the emergency department.

## 2024-07-18 ENCOUNTER — Inpatient Hospital Stay

## 2024-07-18 ENCOUNTER — Inpatient Hospital Stay: Admitting: Oncology

## 2024-07-19 ENCOUNTER — Telehealth: Payer: Self-pay | Admitting: Gastroenterology

## 2024-07-19 ENCOUNTER — Other Ambulatory Visit: Payer: Self-pay | Admitting: Oncology

## 2024-07-19 ENCOUNTER — Encounter: Payer: Self-pay | Admitting: Gastroenterology

## 2024-07-19 MED ORDER — OXYCODONE HCL 5 MG PO TABS: 5.0000 mg | ORAL_TABLET | Freq: Four times a day (QID) | ORAL | 0 refills | Status: AC | PRN

## 2024-07-19 NOTE — Telephone Encounter (Signed)
 This is a Dr Leigh pt and should be scheduled with him. Please reschedule and sent to that POD or further questions.

## 2024-07-19 NOTE — Telephone Encounter (Signed)
 I have spoken to patient's son, Caroline Snyder (as requested by patient in 07/19/24 patient message in MyChart). He indicates that Caroline Snyder has had a couple weeks of worsening nausea and over the last several days has vomited at least 3-4 times. Denies any hematemsis. 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 No abdominal pain noted. Caroline Snyder states that patient is moving her bowels normally and denies any blood in the stool.   Patient is NOT taking pepcid  for any reflux symptoms. She does have zofran  and compazine  on hand and Caroline Snyder states that are starting her back on this along with a bland diet.  Of note: recent MRI scans do not show any evidence of acute or progressing of disease.  Patient and son are concerned about symptoms due to previous diagnosis of biliary cancer.  Patient has been scheduled too see Elida Shawl, NP tomorrow, 07/20/24 at 830 am for further evaluation and recommendations. Caroline Snyder verbalizes understanding of this and thanked me for the call.

## 2024-07-19 NOTE — Telephone Encounter (Signed)
 Spoke w pt's spouse about Caroline Snyder experiencing increased gi symptoms. Appt scheduled for 08/21/24. Spouse requesting a call back for further medical advice.

## 2024-07-19 NOTE — Telephone Encounter (Signed)
 Called to reschedule appt with Dr Leigh. Left VM to call back.

## 2024-07-20 ENCOUNTER — Ambulatory Visit: Admitting: Nurse Practitioner

## 2024-07-20 ENCOUNTER — Telehealth: Payer: Self-pay

## 2024-07-20 ENCOUNTER — Ambulatory Visit: Payer: Self-pay | Admitting: Nurse Practitioner

## 2024-07-20 ENCOUNTER — Encounter: Payer: Self-pay | Admitting: Oncology

## 2024-07-20 ENCOUNTER — Encounter: Payer: Self-pay | Admitting: Nurse Practitioner

## 2024-07-20 ENCOUNTER — Other Ambulatory Visit

## 2024-07-20 VITALS — BP 128/68 | HR 66 | Ht 60.0 in | Wt 93.4 lb

## 2024-07-20 DIAGNOSIS — K219 Gastro-esophageal reflux disease without esophagitis: Secondary | ICD-10-CM | POA: Diagnosis not present

## 2024-07-20 DIAGNOSIS — C240 Malignant neoplasm of extrahepatic bile duct: Secondary | ICD-10-CM | POA: Diagnosis not present

## 2024-07-20 DIAGNOSIS — A048 Other specified bacterial intestinal infections: Secondary | ICD-10-CM

## 2024-07-20 DIAGNOSIS — R1011 Right upper quadrant pain: Secondary | ICD-10-CM

## 2024-07-20 DIAGNOSIS — R112 Nausea with vomiting, unspecified: Secondary | ICD-10-CM

## 2024-07-20 DIAGNOSIS — D649 Anemia, unspecified: Secondary | ICD-10-CM

## 2024-07-20 DIAGNOSIS — Z8619 Personal history of other infectious and parasitic diseases: Secondary | ICD-10-CM | POA: Diagnosis not present

## 2024-07-20 LAB — HEMOGLOBIN AND HEMATOCRIT, BLOOD
HCT: 32.9 % — ABNORMAL LOW (ref 36.0–46.0)
Hemoglobin: 11.3 g/dL — ABNORMAL LOW (ref 12.0–15.0)

## 2024-07-20 MED ORDER — PANTOPRAZOLE SODIUM 40 MG PO TBEC: 40.0000 mg | DELAYED_RELEASE_TABLET | Freq: Two times a day (BID) | ORAL | 1 refills | Status: DC

## 2024-07-20 NOTE — Telephone Encounter (Signed)
 Spoke with patient's so to relay Dr. Hassan recommendation of starting Pantoprazole .  He was concerned because the original appointment with Dr. Wilhelmenia was cancelled when patient's symptoms led them to be seen earlier by [facility].  Sent a message to Drs. Armbruster and Mansouraty to ask if appointment with Dr. Wilhelmenia should be rescheduled

## 2024-07-20 NOTE — Patient Instructions (Signed)
 Your provider has requested that you go to the basement level for lab work before leaving today. Press B on the elevator. The lab is located at the first door on the left as you exit the elevator.  Your provider has ordered [employer] stool testing for you. You have received a kit from our office today containing all necessary supplies to complete this test. Please carefully read the stool collection instructions provided in the kit before opening the accompanying materials. In addition, be sure there is a label providing your full name and date of birth on the puritan opti-swab tube that is supplied in the kit (if you do not see a label with this information on your test tube, please make us  aware before test collection!). After completing the test, you should secure the purtian tube into the specimen biohazard bag. The [facility] E-Req sheet (including date and time of specimen collection) should be placed into the outside pocket of the specimen biohazard bag and returned to the [place] lab (basement floor of [facility]) within 3 days of collection. Please make sure to give the specimen to a staff member at the lab. DO NOT leave the specimen on the counter.   If the specimen date and time (can be found in the upper right boxed portion of the sheet) are not filled out on the E-Req sheet, the test will NOT be performed.   Take Miralax every evening  Continue pain management per oncologist, Dr. Autumn  Continue Zofran  8mg  every 6-8 hours as needed  Continue Compazine  10mg  twice a day as needed.  Drink 2 cans of Ensure daily  _______________________________________________________  If your blood pressure at your visit was 140/90 or greater, please contact your primary care physician to follow up on this.  _______________________________________________________  If you are age 84 or older, your body mass index should be between 23-30. Your Body mass index is  18.24 kg/m. If this is out of the aforementioned range listed, please consider follow up with your Primary Care Provider.  If you are age 84 or younger, your body mass index should be between 19-25. Your Body mass index is 18.24 kg/m. If this is out of the aformentioned range listed, please consider follow up with your Primary Care Provider.   ________________________________________________________  The [facility] providers would like to encourage you to use MYCHART to communicate with providers for non-urgent requests or questions.  Due to long hold times on the telephone, sending your provider a message by [facility] may be a faster and more efficient way to get a response.  Please allow 48 business hours for a response.  Please remember that this is for non-urgent requests.  _______________________________________________________  [facility] is using a team-based approach to care.  Your team is made up of your doctor and two to three APPS. Our APPS (Nurse Practitioners and Physician Assistants) work with your physician to ensure care continuity for you. They are fully qualified to address your health concerns and develop a treatment plan. They communicate directly with your gastroenterologist to care for you. Seeing the Advanced Practice Practitioners on your physician's team can help you by facilitating care more promptly, often allowing for earlier appointments, access to diagnostic testing, procedures, and other specialty referrals.

## 2024-07-20 NOTE — Progress Notes (Signed)
 Caroline Snyder, pls contact patient and let her know Dr. Leigh recommended for her to take Pantoprazole  40mg  one capsule twice daily. Pls send RX for Pantoprazole  40mg  po bid to be taken 30 minutes before breakfast and dinner. # 60, 1 refill. THX.

## 2024-07-20 NOTE — Progress Notes (Signed)
 "    07/20/2024 RUE VALLADARES [id number] 31-Oct-1940   Chief Complaint: N/V, pain under the right breast, right flank and right back pain  History of Present Illness: Caroline Snyder is an 83 year old female with a history of vascular dementia, H. 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  She is known by Dr. Leigh.   She was previously admitted to the hospital at the end of May with elevated liver enzymes and abnormal imaging in the setting of poor p.o. intake, nausea vomiting. MRCP showed moderate to severe intrahepatic ductal dilation concerning for a stricture at the head of the pancreas, as well as dilated pancreatic duct with concern for pancreatic mass. She underwent an EGD/EUS/ERCP with Dr. Wilhelmenia on May 21. Biopsies of her stomach were remarkable for H. pylori for which she was treated. EUS showed pancreatic mass, biopsies obtained, ERCP was successful with placement of a plastic stent. Her liver enzymes normalized.   She was evaluated by surgery and oncology, ultimately declined a Whipple procedure (she has localized disease and was determined to be a surgical candidate), they have elected to proceed with radiation and chemotherapy   She underwent follow-up ERCP with Dr. Wilhelmenia 03/19/2024, the prior biliary stent was removed and a single moderate biliary stricture was identified in the middle third of the main bile duct, stricture was malignant appearing, the biliary tree was swept and sludge was found and 1 uncovered metal biliary stent was placed in the CBD to traverse the stricture.  She was admitted to the hospital 8/15 - 03/22/2024 with N/V and diarrhea.  CTAP concerning for colitis.  Stool studies were negative.  Empirically on IV antibiotics.  She was subsequently admitted to the hospital 8/22 - 8/27/20225 with N/V and diarrhea. CT abdomen and pelvis obtained with colonic wall thickening and  pericolonic edema extending from the cecum through the transverse colon and also involving the sigmoid colon consistent with colitis of infectious, inflammatory or ischemic etiology. Fluid-filled small bowel in the right lower quadrant with mild mucosal enhancement of terminal ileum consistent with ileitis. Common bile duct stent in place with pneumobilia. No biliary distention. Indistinct hazy soft tissue density at the porta hepatis, surrounding common bile duct stent and now with interval focal narrowed appearance of the portal vein and presumably related to history of known bile duct malignancy. No evidence of portal vein thrombosis or occlusion. Hepatic steatosis. Aortic atherosclerosis.  GI pathogen and C. difficile PCR were negative.  She was treated with IV antibiotics then transition to oral Augmentin  probiotic.  Palliative care was consulted.  Patient's clinical status stabilized and she was discharged home 03/28/2024.  She was last seen by her oncologist, Dr. Autumn, 07/16/2024 with ongoing nausea, vomiting, right chest wall and back pain.  No evidence of recurrent cancer per recent CT.  She underwent abdominal MRI/MRCP with and without contrast 07/10/2024 which did not identify any obstructing mass or intra hepatic/extrahepatic biliary ductal dilatation and there was subtle fat stranding surrounding pancreatic head, unchanged when compared to prior imaging possible sequela of chronic versus acute pancreatitis.  She presents today accompanied by her son [name] who provides most of the patient's symptom history.  Patient has had recurrent nausea and vomiting with pain under the right breast which radiates to the right flank and right mid back for the past 3 weeks.  Nausea is fairly constant and she intermittently vomits clear to bilious fluid or partially digested food several times weekly.  No coffee-ground or frank hematemesis.  She previously was not taking oxycodone  as previously prescribed.  Her son  stated she started taking oxycodone  on every 6 hours yesterday as recommended by her oncologist, Dr. Autumn.  She last took oxycodone  at 8 AM this morning.  No diarrhea.  She is passing normal bowel movement most days, last BM was yesterday.  No bloody or black stools.      Latest Ref Rng & Units 07/17/2024    7:00 PM 07/17/2024    6:30 PM 07/02/2024    2:32 PM  CBC  WBC 4.0 - 10.5 K/uL  5.3  4.8   Hemoglobin 12.0 - 15.0 g/dL 9.2  88.4  87.5   Hematocrit 36.0 - 46.0 % 27.0  34.9  36.7   Platelets 150 - 400 K/uL  250  253        Latest Ref Rng & Units 07/17/2024    7:00 PM 07/17/2024    6:30 PM 07/02/2024    2:32 PM  CMP  Glucose 70 - 99 mg/dL 893  886  895   BUN 8 - 23 mg/dL 25  20  18    Creatinine 0.44 - 1.00 mg/dL 9.09  9.19  9.16   Sodium 135 - 145 mmol/L 136  136  135   Potassium 3.5 - 5.1 mmol/L 4.8  4.5  4.8   Chloride 98 - 111 mmol/L 100  100  98   CO2 22 - 32 mmol/L  27  26   Calcium  8.9 - 10.3 mg/dL  9.7  89.5   Total Protein 6.5 - 8.1 g/dL  6.8  7.6   Total Bilirubin 0.0 - 1.2 mg/dL  <9.7  0.2   Alkaline Phos 38 - 126 U/L  80  92   AST 15 - 41 U/L  34  34   ALT 0 - 44 U/L  25  20   Lipase 33 on 07/17/2024 CA 19-9 level 55 on 07/02/2024 CEA level 3.07 on 07/02/2024  MRCP 12/19/23: IMPRESSION: 1. The gallbladder is now decompressed. No signs of choledocholithiasis. 2. Moderate to severe intrahepatic bile duct dilatation. There is abrupt beak like narrowing of the mid common bile duct at the level of the head of pancreas. Findings are concerning for a stricture at the level of the head of pancreas. 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 3. Mild diffuse edema within the pancreatic parenchyma noted, which is concerning for pancreatitis. No signs of pancreatic necrosis or pseudocyst formation. 4. Mild increase caliber of the main pancreatic duct  which measures up to 4 mm at the level of the pancreatic neck, which abruptly decreases in caliber at the head of pancreas. Focal area of mild increased T2 signal with restricted diffusion in the anterior head of pancreas measures 2.2 x 1.2 by 2.2 cm. Cannot exclude pancreatic head neoplasm. Consider further evaluation with endoscopic ultrasound and tissue sampling. 5. Aortic Atherosclerosis (ICD10-I70.0).     EGD / EUS 12/21/23: EGD Impression: - No gross lesions in the entire esophagus. Z-line regular, 40 cm from the incisors. - Gastritis with hemorrhage. Biopsied. - No gross lesions in the duodenal bulb, in the first portion of the duodenum and in the second portion of the duodenum. - Normal major papilla.    EUS Impression: - A mass was found within the middle third of the main bile duct. Cytology results are pending. However, the  endosonographic appearance is highly suspicious for adenocarcinoma (extrahepatic cholangiocarcinoma). Fine needle biopsy performed. - Pancreatic parenchymal abnormalities consisting of lobularity and hyperechoic strands were noted in the entire pancreas. No overt mass noted within the pancreas. - Main pancreatic duct (MPD) diameter was measured. Endosonographically, the MPD had a normal appearance otherwise. - No malignant-appearing lymph nodes were visualized in the celiac region (level 20), peripancreatic region and porta hepatis region.   ERCP 12/21/23: - The major papilla appeared normal. - Extremely difficult cannulation requiring attempt at double-wire, over pancreatic stent, precut fistulotomy to eventually gain biliary access. - A single severe biliary stricture was found in the middle third of the main bile duct. The stricture was malignant appearing. - The upper third of the main bile duct, left main hepatic duct and right main hepatic duct were moderately dilated, secondary to a stricture. The stricture was brushed for cytology. - One temporary plastic biliary  stent was placed into the ventral pancreatic duct to decrease PEP and aid in cannulation. - One plastic biliary stent was placed into the common bile duct to traverse the stricture.   - Repeat ERCP in 4 months to exchange stent to permanent stent pending overall clinical status.     FINAL MICROSCOPIC DIAGNOSIS:   A. 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  -  An immunohistochemical stain for Helicobacter pylori organisms is  positive.    FINAL MICROSCOPIC DIAGNOSIS:  A. COMMON BILE DUCT, NODULE FINE NEEDLE ASPIRATION:  - Malignant  - Adenocarcinoma    FINAL MICROSCOPIC DIAGNOSIS:  - Malignant cells present  - Adenocarcinoma      CT chest 12/22/23: IMPRESSION: 1. No convincing evidence for metastatic disease in the chest. 2. Several very tiny scattered bilateral pulmonary nodules, likely benign. However, given patient history, surveillance warranted. 3. Pneumobilia in the incompletely visualized liver, presumably secondary to biliary stent placement on 12/21/2023. 4.  Aortic Atherosclerosis (ICD10-I70.0).     CT abd / pelvis 12/29/23: IMPRESSION: Internal common bile duct stent in place, with resolution of diffuse biliary ductal dilatation since previous study.   Soft tissue thickening involving the proximal common bile duct surrounding stent, consistent with known bile duct adenocarcinoma/cholangiocarcinoma.   No evidence of metastatic disease.   Mild hepatic steatosis.   Colonic diverticulosis, without radiographic evidence of diverticulitis.   Large colonic stool burden noted; recommend clinical correlation for possible constipation.     Abd x ray 01/06/24: IMPRESSION: Stable biliary duct stent.   Pancreatic stent has passed.   ERCP April 07, 2024: - Prior biliary fistulotomy appeared open.  - One visibly patent stent from the biliary tree was seen. This was removed.  - A  single moderate biliary stricture was found in the middle third of the main bile duct. The stricture was malignant appearing (previously biopsied).  - The biliary tree was swept and sludge was found.  - One uncovered metal biliary stent was placed into the common bile duct to traverse the stricture (this is noted a few millimeters below the bifurcation in regards to its proximal aspect).  Abdominal MRI/MRCP with and without contrast 07/10/2024: FINDINGS: Lower chest: Unremarkable MR appearance to the lung bases. No pleural effusion. No pericardial effusion. Normal heart size.   Hepatobiliary: The liver is normal in size. Noncirrhotic configuration. No focal liver lesion. No intrahepatic bile duct dilation. Redemonstration of stent in the extrahepatic bile duct which exhibit air-fluid level. No discrete obstructing mass or calculus seen.  Unremarkable gallbladder.   Pancreas: Mild-to-moderate diffuse atrophy of pancreas. No focal lesion seen. Main pancreatic duct is not dilated. There is subtle fat stranding surrounding the pancreatic head, similar to the recent prior study. Findings are nonspecific and may represent sequela of chronic versus acute pancreatitis. Correlate clinically and with serum lipase levels.   Spleen:  Within normal limits in size and appearance. No focal mass.   Adrenals/Urinary Tract: Unremarkable adrenal glands. No hydroureteronephrosis. No suspicious renal mass. There are scattered, subcentimeter sized simple cortical and sinus cysts in bilateral kidneys.   Stomach/Bowel: Visualized portions within the abdomen are unremarkable. No disproportionate dilation of bowel loops.   Vascular/Lymphatic: No pathologically enlarged lymph nodes identified. No abdominal aortic aneurysm demonstrated. No ascites.   Other:  None.   Musculoskeletal: No suspicious bone lesions identified.   IMPRESSION: 1. No discrete obstructing mass or calculus seen in the  extrahepatic bile duct. No intrahepatic bile duct dilation. 2. There is subtle fat stranding surrounding the pancreatic head, similar to the recent prior study. Findings are nonspecific and may represent sequela of chronic versus acute pancreatitis. Correlate clinically and with serum lipase levels. 3. Multiple other nonacute observations, as described above.    Medications Ordered Prior to Encounter[1] Allergies[2]  Current Medications, Allergies, Past Medical History, Past Surgical History, Family History and Social History were reviewed in [employer] record.  Review of Systems:   Constitutional: + Weight loss.  Respiratory: Negative for shortness of breath.   Cardiovascular: + Right chest/breasts pain. Gastrointestinal: See HPI.  Musculoskeletal: Negative for back pain or muscle aches.  Neurological: Negative for dizziness, headaches or paresthesias.   Physical Exam: BP 128/68   Pulse 66   Ht 5' (1.524 m)   Wt 93 lb 6 oz (42.4 kg)   BMI 18.24 kg/m   Wt Readings from Last 3 Encounters:  07/20/24 93 lb 6 oz (42.4 kg)  07/16/24 92 lb 9.6 oz (42 kg)  07/02/24 90 lb (40.8 kg)    General: Frail,  pale and cachectic appearing 83 year old female in no acute distress. Head: Normocephalic and atraumatic. Eyes: No scleral icterus. Conjunctiva pink . Ears: Normal auditory acuity. Mouth: Dentition intact. No ulcers or lesions.  Lungs: Clear throughout to auscultation. Heart: Regular rate and rhythm, no murmur. Abdomen: Soft, nontender (patient took oxycodone  prior to appointment time) and nondistended. No masses or hepatomegaly. Normal bowel sounds x 4 quadrants.  Rectal: Deferred.  Musculoskeletal: Symmetrical with no gross deformities. Extremities: No edema. Neurological: Alert oriented x 4. No focal deficits.  Psychological: Alert and cooperative. 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  Patient did not wish to pursue surgical intervention.  Initial ERCP with biliary stent placement 12/2023. S/P ERCP with biliary stent exchange 03/19/2024. Presents with N/V, right chest/flank and back pain x 3 weeks. Started on Oxycodone  Q 6hrs on 12/18. MRI/MRCP showed no evidence of a biliary obstructing mass or intrahepatic/extrahepatic biliary ductal dilatation.  Normal LFTs. - Pain management per oncologist Dr. Autumn - Continue Ondansetron  8 mg p.o. every 6 to 8 hours as needed and Compazine  10 mg p.o. twice daily as needed - Ensure 2 cans daily - UA with reflex - Patient appears to be exhibiting failure to thrive, defer hospice recommendations to Dr. Autumn DEL. Pylori identified per EGD at time of ERCP 12/2023, treated. - Check [employer] H. pylori stool antigen to  assess for H. pylori eradication  Normocytic anemia. Hg 11.5 -> 9.2 (likely dilutional). No overt GI bleeding.  - H/H  GERD, no longer taking Famotidine       [1]  Current Outpatient Medications on File Prior to Visit  Medication Sig Dispense Refill   acetaminophen  (TYLENOL ) 500 MG tablet Take 500-1,000 mg by mouth every 8 (eight) hours as needed (for pain).     aspirin  EC 81 MG tablet Take 1 tablet (81 mg total) by mouth daily. Swallow whole. 30 tablet 11   Ensure (ENSURE) Take 237 mLs by mouth 2 (two) times daily between meals.     famotidine  (PEPCID ) 40 MG tablet Take 1 tablet (40 mg total) by mouth daily for 14 days. 14 tablet 0   hydrALAZINE  (APRESOLINE ) 10 MG tablet Take 1 tablet (10 mg total) by mouth 2 (two) times daily as needed. For SBP >160 20 tablet 0   losartan  (COZAAR ) 50 MG tablet Take 1 tablet (50 mg total) by mouth daily. 90 tablet 1   Multiple Vitamins-Minerals (ONE-A-DAY WOMENS VITACRAVES PO) Take by mouth daily.     ondansetron  (ZOFRAN ) 8 MG tablet Take 1 tablet (8 mg total) by mouth every 8 (eight) hours as  needed for nausea or vomiting. 30 tablet 3   oxyCODONE  (OXY IR/ROXICODONE ) 5 MG immediate release tablet Take 1 tablet (5 mg total) by mouth every 6 (six) hours as needed for severe pain (pain score 7-10). 60 tablet 0   polyethylene glycol (MIRALAX / GLYCOLAX) 17 g packet Take 17 g by mouth every other day.     Probiotic Product (PROBIOTIC DAILY PO) Take by mouth daily.     prochlorperazine  (COMPAZINE ) 10 MG tablet Take 0.5 tablets (5 mg total) by mouth every 6 (six) hours as needed for refractory nausea / vomiting. 15 tablet 0   rosuvastatin  (CRESTOR ) 20 MG tablet TAKE 1 TABLET BY MOUTH EVERY DAY 90 tablet 1   sucralfate  (CARAFATE ) 1 g tablet Take 1 tablet (1 g total) by mouth in the morning, at noon, and at bedtime for 14 days. 42 tablet 0   Current Facility-Administered Medications on File Prior to Visit  Medication Dose Route Frequency Provider Last Rate Last Admin   denosumab  (PROLIA ) injection 60 mg  60 mg Subcutaneous Once Eubanks, Jessica K, NP      [2]  Allergies Allergen Reactions   Aricept  [Donepezil ] Diarrhea   Vioxx [Rofecoxib] Nausea Only    Pt. Can't remember what reaction was but asks to leave on her profile.    "

## 2024-07-20 NOTE — Progress Notes (Signed)
 Agree with assessment and plan as outlined.  I would escalate to PPI given her symptoms - omeprazole or protonix  40mg  / day.  Recent imaging does not show any recurrent malignancy or biliary obstruction. LAEs normal.  Noted to be on narcotics recently for pain, this may make nausea / vomiting worse. Consideration for EGD pending her course depending on how aggressive she wishes to be with her care at this point. Palliative care evaluation may be reasonable, defer to oncology

## 2024-07-20 NOTE — Assessment & Plan Note (Signed)
 Please review oncology history for additional details and timeline of events.    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  IHC stain for H. pylori was positive.   On 12/22/2023 CT chest with contrast showed no evidence of intrathoracic metastatic disease.  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   She did have consultation with Dr. Dasie.  Dr. Dasie did offer surgery as an option but because of the local extent of the disease, she will need to undergo Whipple surgery.  Patient and family members would like for her to avoid surgery as they are worried about her postoperative recovery from Whipple surgery.  They opted to proceed with concurrent chemoradiation instead. Treatment aims to control disease, not cure.   She began concurrent chemoradiation with capecitabine  from 02/13/2024.  Capecitabine  was given at a dose of 800 mg/m twice daily on Monday to Friday during the course of radiation.  This translated to 1000 mg p.o. twice daily dosing for her. She completed treatments on 03/22/2024.  She underwent biliary stent exchange on 03/19/2024.  The stent is functioning well, with normal bilirubin and liver function tests.   She had to be directed to the ED for persistent abdominal pain and a CT scan on 04/11/2024 showed patent biliary stent and no evidence of disease progression.  It showed treatment-related changes.  She was taken to the ED by her family members on 06/29/2024 for nausea, vomiting and right sided chest pain.  CT chest, abdomen pelvis with angiogram was obtained which showed no evidence of acute process.  It also showed no evidence of residual disease.  On 07/10/2024, restaging MRI of  the abdomen showed no discrete obstructing mass or calculus seen in the extrahepatic bile duct. No intrahepatic bile duct dilation. There is subtle fat stranding surrounding the pancreatic head, similar to the recent prior study. Findings are nonspecific and may represent sequela of chronic versus acute pancreatitis.  Overall no evidence of disease.  Will continue surveillance.  No current concerns related to the stent or tumor progression based on recent imaging.  Clinically she is looking a lot better currently.  CEA is normal at 3 recently.  CA 19-9 was better at 55, compared to peak of 180.  LFTs are all normal.  CBCD unremarkable.  RTC in 6 weeks for follow-up with repeat labs.

## 2024-07-20 NOTE — Telephone Encounter (Signed)
 Spoke with patient's daughter-in-law and informed that oxycodone  5 mg was sent to [facility] yesterday afternoon with a frequency change from BID prn to q6hprn. Family member understood and agreed and also was advised to call us  back if this change in pain management was not working.

## 2024-07-23 NOTE — Progress Notes (Signed)
 See lab note,  UA result notes to PCP.

## 2024-07-24 ENCOUNTER — Other Ambulatory Visit: Payer: Self-pay | Admitting: Oncology

## 2024-07-24 ENCOUNTER — Telehealth: Payer: Self-pay

## 2024-07-24 DIAGNOSIS — R109 Unspecified abdominal pain: Secondary | ICD-10-CM

## 2024-07-24 LAB — MICROSCOPIC EXAMINATION: Casts: NONE SEEN /LPF

## 2024-07-24 LAB — URINE CULTURE, REFLEX

## 2024-07-24 LAB — UA/M W/RFLX CULTURE, ROUTINE
Bilirubin, UA: NEGATIVE
Glucose, UA: NEGATIVE
Ketones, UA: NEGATIVE
Leukocytes,UA: NEGATIVE
Nitrite, UA: POSITIVE — AB
Protein,UA: NEGATIVE
RBC, UA: NEGATIVE
Specific Gravity, UA: 1.014 (ref 1.005–1.030)
Urobilinogen, Ur: 0.2 mg/dL (ref 0.2–1.0)
pH, UA: 6.5 (ref 5.0–7.5)

## 2024-07-24 MED ORDER — FENTANYL 50 MCG/HR TD PT72: 1.0000 | MEDICATED_PATCH | TRANSDERMAL | 0 refills | Status: AC

## 2024-07-24 NOTE — Telephone Encounter (Signed)
 Informed patient's son that Dr. Autumn sent a new prescription for fentanyl  patch 50 mcg/h to [facility]. Son informed to keep this transdermal patch in a locked area to keep out of reach of concerning parties and to inquire from [facility] to dispose to patches as recommended safely. Patient' son understood and agreed with instructions.

## 2024-07-27 ENCOUNTER — Other Ambulatory Visit: Payer: Self-pay

## 2024-07-27 DIAGNOSIS — K5900 Constipation, unspecified: Secondary | ICD-10-CM

## 2024-07-27 DIAGNOSIS — R1011 Right upper quadrant pain: Secondary | ICD-10-CM

## 2024-07-27 DIAGNOSIS — R109 Unspecified abdominal pain: Secondary | ICD-10-CM

## 2024-07-27 DIAGNOSIS — C240 Malignant neoplasm of extrahepatic bile duct: Secondary | ICD-10-CM

## 2024-07-27 DIAGNOSIS — R0789 Other chest pain: Secondary | ICD-10-CM

## 2024-07-27 DIAGNOSIS — G893 Neoplasm related pain (acute) (chronic): Secondary | ICD-10-CM

## 2024-07-27 MED ORDER — NITROFURANTOIN MONOHYD MACRO 100 MG PO CAPS: 100.0000 mg | ORAL_CAPSULE | Freq: Two times a day (BID) | ORAL | 0 refills | Status: DC

## 2024-07-30 ENCOUNTER — Emergency Department (HOSPITAL_COMMUNITY)
Admission: EM | Admit: 2024-07-30 | Discharge: 2024-07-30 | Discharge status: Against Medical Advice | Attending: Emergency Medicine | Admitting: Emergency Medicine

## 2024-07-30 ENCOUNTER — Encounter (HOSPITAL_COMMUNITY): Payer: Self-pay

## 2024-07-30 ENCOUNTER — Other Ambulatory Visit: Payer: Self-pay

## 2024-07-30 ENCOUNTER — Emergency Department (HOSPITAL_COMMUNITY)

## 2024-07-30 ENCOUNTER — Telehealth: Payer: Self-pay | Admitting: Oncology

## 2024-07-30 DIAGNOSIS — M549 Dorsalgia, unspecified: Secondary | ICD-10-CM | POA: Diagnosis not present

## 2024-07-30 DIAGNOSIS — M79651 Pain in right thigh: Secondary | ICD-10-CM | POA: Diagnosis present

## 2024-07-30 DIAGNOSIS — Z5321 Procedure and treatment not carried out due to patient leaving prior to being seen by health care provider: Secondary | ICD-10-CM | POA: Diagnosis not present

## 2024-07-30 NOTE — ED Triage Notes (Signed)
 Nontraumatic right upper leg pain x 3 days.   Initially started mild like a cramp but has since become unbearable.   Takes oxycodone  5mg  for back pain and not helping.   No swelling, redness or warmth to area.

## 2024-07-31 ENCOUNTER — Encounter (HOSPITAL_COMMUNITY): Payer: Self-pay

## 2024-07-31 ENCOUNTER — Emergency Department (HOSPITAL_COMMUNITY): Admission: EM | Admit: 2024-07-31 | Discharge: 2024-07-31 | Discharge status: Against Medical Advice

## 2024-07-31 ENCOUNTER — Other Ambulatory Visit: Payer: Self-pay

## 2024-07-31 ENCOUNTER — Other Ambulatory Visit (HOSPITAL_COMMUNITY): Payer: Self-pay

## 2024-07-31 ENCOUNTER — Telehealth: Payer: Self-pay

## 2024-07-31 ENCOUNTER — Ambulatory Visit: Payer: Self-pay

## 2024-07-31 DIAGNOSIS — M549 Dorsalgia, unspecified: Secondary | ICD-10-CM | POA: Diagnosis present

## 2024-07-31 DIAGNOSIS — Z5321 Procedure and treatment not carried out due to patient leaving prior to being seen by health care provider: Secondary | ICD-10-CM | POA: Diagnosis not present

## 2024-07-31 LAB — COMPREHENSIVE METABOLIC PANEL WITH GFR
ALT: 28 U/L (ref 0–44)
AST: 34 U/L (ref 15–41)
Albumin: 4.2 g/dL (ref 3.5–5.0)
Alkaline Phosphatase: 79 U/L (ref 38–126)
Anion gap: 11 (ref 5–15)
BUN: 16 mg/dL (ref 8–23)
CO2: 24 mmol/L (ref 22–32)
Calcium: 10 mg/dL (ref 8.9–10.3)
Chloride: 101 mmol/L (ref 98–111)
Creatinine, Ser: 0.93 mg/dL (ref 0.44–1.00)
GFR, Estimated: >60 mL/min
Glucose, Bld: 152 mg/dL — ABNORMAL HIGH (ref 70–99)
Potassium: 3.9 mmol/L (ref 3.5–5.1)
Sodium: 136 mmol/L (ref 135–145)
Total Bilirubin: 0.2 mg/dL (ref 0.0–1.2)
Total Protein: 6.9 g/dL (ref 6.5–8.1)

## 2024-07-31 LAB — CBC WITH DIFFERENTIAL/PLATELET
Abs Immature Granulocytes: 0.02 K/uL (ref 0.00–0.07)
Basophils Absolute: 0 K/uL (ref 0.0–0.1)
Basophils Relative: 1 %
Eosinophils Absolute: 0 K/uL (ref 0.0–0.5)
Eosinophils Relative: 1 %
HCT: 34.9 % — ABNORMAL LOW (ref 36.0–46.0)
Hemoglobin: 11.8 g/dL — ABNORMAL LOW (ref 12.0–15.0)
Immature Granulocytes: 0 %
Lymphocytes Relative: 12 %
Lymphs Abs: 0.7 K/uL (ref 0.7–4.0)
MCH: 30.8 pg (ref 26.0–34.0)
MCHC: 33.8 g/dL (ref 30.0–36.0)
MCV: 91.1 fL (ref 80.0–100.0)
Monocytes Absolute: 0.4 K/uL (ref 0.1–1.0)
Monocytes Relative: 8 %
Neutro Abs: 4.2 K/uL (ref 1.7–7.7)
Neutrophils Relative %: 78 %
Platelets: 296 K/uL (ref 150–400)
RBC: 3.83 MIL/uL — ABNORMAL LOW (ref 3.87–5.11)
RDW: 12 % (ref 11.5–15.5)
WBC: 5.3 K/uL (ref 4.0–10.5)
nRBC: 0 % (ref 0.0–0.2)

## 2024-07-31 LAB — LIPASE, BLOOD: Lipase: 12 U/L (ref 11–51)

## 2024-07-31 MED ORDER — OXYCODONE HCL 5 MG PO TABS
5.0000 mg | ORAL_TABLET | ORAL | Status: DC | PRN
  Administered 2024-07-31: 5 mg via ORAL
  Filled 2024-07-31: qty 1

## 2024-07-31 MED ORDER — ONDANSETRON HCL 4 MG/2ML IJ SOLN
4.0000 mg | Freq: Once | INTRAMUSCULAR | Status: AC
  Administered 2024-07-31: 4 mg via INTRAVENOUS
  Filled 2024-07-31: qty 2

## 2024-07-31 NOTE — Telephone Encounter (Signed)
 Lonzo Garre, RN to [facility]  (Selected Message)     07/31/24 12:05 PM Action needed: Hospital follow up needed, requests pcp this week, no appointments are available. Please follow up with son [name]  [phone] call twice if no answer on first try

## 2024-07-31 NOTE — Telephone Encounter (Signed)
 Followed up on voice mail message left by patient's son, [name], who was asking for an update on the Fentanyl  patch medication that was ordered for patient's leg pain by Dr. Autumn. Because the Fentanyl  could not be obtained by [facility]'s [facility], [name] was requesting that it be sent to another pharmacy. Request was placed with Dr. Cloretta who was on-call for Dr. Autumn. Dr. Cloretta declined the Fentanyl  due to pain that was unrelated to cancer. [name] was made aware of this and was also advised to see patient's PCP or any other providers such as Orthopedics if already established. [name] reported that he already had set up a PCP appt. Patient does have a history of sciatica pain per Randall's report.

## 2024-07-31 NOTE — ED Notes (Signed)
 Patient said she is not waiting that long in the lobby. She said she does not want to sit around and catch the flu. Patient leaving. Rob, PA notified.

## 2024-07-31 NOTE — Telephone Encounter (Signed)
 Noted

## 2024-07-31 NOTE — Telephone Encounter (Signed)
 Returned call per secure chat from scheduler. Spoke with patient's son who reported the following:   Right upper front thigh and behind knee pain of 7/10 on pain scale which started 5 days ago but has become worse. Denied  redness or swelling or hotness to painful areas. Patient went to ED yesterday with no findings per DG Femur Min 2 Views Right with No findings. Cramping sensation. Pain Oxycodone   taken to tx for last 2 weeks. Has not started the Fentanyl  patch yet that Dr Autumn ordered.  Recent UTI completing last abx dose tonight.   I advised the patient/son to pick up the Fentanyl  and take as prescribed per Dr. Autumn. New prescription needs to be send to [facility] not [facility]  per son's report.

## 2024-07-31 NOTE — Telephone Encounter (Signed)
 FYI Only or Action Required?: Action required by provider: request for appointment.  Patient was last seen in primary care on 06/26/2024 by [name], [name], NP.  Called Nurse Triage reporting leg cramping.  Symptoms began 5 days ago.  Interventions attempted: Other: ER-left AMA, pending fentanyl  from oncology.  Symptoms are: unchanged.  Triage Disposition: See PCP When Office is Open (Within 3 Days)  Patient/caregiver understands and will follow disposition?: No, refuses disposition   Reason for Disposition  [1] MODERATE pain (e.g., interferes with normal activities, limping) AND [2] present > 3 days  Answer Assessment - Initial Assessment Questions Son Caroline Snyder calling back after missed call from triage nurse he verbalized  frustration that he had to call back and go through the agent and on hold for triage, he asks that for all call back if no answer to immediately call back a second time for phone to ring through. Explained if triage call goes unanswered that nurse triage will make two additional attempts to contact, he verbalized understanding.  Caroline Snyder called in initially to schedule an appointment with pcp to evaluate leg pain and for blood work. 5 days ago she developed severe right leg pain, she went to the ER on 07/30/24 where she received an Xray but ultimately left AMA due to already waiting 8 hours after xray and staff unable to give an estimate time for evaluation. Since calling in to pcp office today son Caroline Snyder has made contact with oncology office, they triaged her leg pain and prescribed fentanyl  for patient, this prescription is being processed and not picked up yet. Caroline Snyder is requesting an appointment for follow up with pcp. None are available until 08/24/24, patient son Caroline Snyder is requesting a work in visit with pcp. Please follow up call to Caroline Snyder [phone] if no answer please try second time right away.    1. ONSET: When did the pain start?      5 days  ago 2. LOCATION: Where is the pain located?      Right leg  3. PAIN: How bad is the pain?    (Scale 1-10; or mild, moderate, severe)     severe 4. WORK OR EXERCISE: Has there been any recent work or exercise that involved this part of the body?       5. CAUSE: What do you think is causing the leg pain?     unsure 6. OTHER SYMPTOMS: Do you have any other symptoms? (e.g., chest pain, back pain, breathing difficulty, swelling, rash, fever, numbness, weakness)     On last day of abx for UTI.  Protocols used: Leg Pain-A-AH Message from Goodrich B sent at 07/31/2024  8:14 AM EST  Summary: [id number]: Caroline Snyder   Reason for Triage: leg cramps right leg was in ed last night but left because the wait times were extensive. has been going on for the past five days and getting worse.

## 2024-07-31 NOTE — Telephone Encounter (Signed)
 1st attempt - left VM for patient requesting call back.  Note: Pt's son spoke to pt's oncologist prior to NT call back and they prescribed fentanyl  after reviewing her ED imaging and documentation.  ED documentation states Nontraumatic right upper leg pain x 3 days. DG Femur radiograph impression states No acute findings. Onco note states pt left ED prior to full evaluation.   Message from Caney B sent at 07/31/2024  8:14 AM EST  Summary: [id number]: Caroline Snyder   Reason for Triage: leg cramps right leg was in ed last night but left because the wait times were extensive. has been going on for the past five days and getting worse.         Past Medical History:  Diagnosis Date   Abnormal CT of the chest 11/24/2013   See CT chest 11/22/13  1. No CT evidence of pulmonary arterial embolic disease.   2. Enlarged lymph nodes in the right hilar and subcarinal region.   These may represent reactive lymph nodes, clinical correlation   recommended. There is otherwise no evidence of mediastinal or   parenchymal masses or nodules nor infiltrates.   3. 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     Depression 05/05/2007   External hemorrhoids without mention of complication 03/24/1999   GERD (gastroesophageal reflux disease) 08/02/1998   Hyperlipidemia 06/29/2006   Hypertensive emergency 09/13/2023   Memory loss 04/15/2003   Migraine without aura, without mention of intractable migraine without mention of status migrainosus 03/24/1999   Osteoporosis, unspecified 04/15/2004   Tear film insufficiency, unspecified  03/24/1999   Unspecified essential hypertension 11/22/2007

## 2024-07-31 NOTE — ED Provider Triage Note (Signed)
 Emergency Medicine Provider Triage Evaluation Note  Caroline Snyder , a 83 y.o. female  was evaluated in triage.  Pt complains of right sided back pain that radiates down through the right leg, chronic pain.  Previously seen on 16 December for similar type pain, right flank pain and nausea.  Patient also complains of nausea today.  Previous diagnoses are of primary cholangiocarcinoma, vascular dementia, osteoporosis.  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  Review of Systems  Positive: As above Negative:   Physical Exam  BP 131/73   Pulse 73   Temp 98.2 F (36.8 C)   Resp 17   Ht 5' (1.524 m)   Wt 42 kg   SpO2 100%   BMI 18.08 kg/m  Gen:   Awake, no distress   Resp:  Normal effort  MSK:   Moves extremities without difficulty  Other:    Medical Decision Making  Medically screening exam initiated at 4:31 PM.  Appropriate orders placed.  Caroline Snyder was informed that the remainder of the evaluation will be completed by another provider, this initial triage assessment does not replace that evaluation, and the importance of remaining in the ED until their evaluation is complete.  Initial labs ordered.   Caroline Snyder, Caroline Snyder 07/31/24 [phone]

## 2024-07-31 NOTE — ED Triage Notes (Addendum)
 Patient BIB [facility] from home. Has right sided back and hip pain down to her right leg. Prescribed oxycodone  that is not touching the pain. Ambulatory. Feels nauseous due to pain.  EMS 650mg  tylenol 

## 2024-07-31 NOTE — Telephone Encounter (Signed)
 See triage notes from triage nurse. Spoke with patient's son and was able to schedule an appointment with one our providers in office on August 08, 2024.   Message sent to [name], NP

## 2024-08-01 ENCOUNTER — Telehealth: Payer: Self-pay | Admitting: *Deleted

## 2024-08-01 ENCOUNTER — Telehealth: Payer: Self-pay

## 2024-08-01 ENCOUNTER — Other Ambulatory Visit: Payer: Self-pay

## 2024-08-01 NOTE — Telephone Encounter (Signed)
 Daughter in law Madeline called this morning.  Return call to her.  States the patient has been to the ER twice this week due to pain and nausea.  States it was a 20 hours wait with the ER being full with the flu.  She states she was told via ER that we should be monitoring her care.  She is very upset. States on yesterday a Rx for Fentanyl  was called into [facility], but then it was canceled due to it being too strong for the patient.  She is only taking Oxy and it only lasts for 2 hours.  Patient and daughter in law are here now in the waiting area demanding care from us .  Please advise!

## 2024-08-01 NOTE — Progress Notes (Signed)
 Spoke with [facility] staff, [facility] in Referrals and Harlene in scheduling. Caroline Snyder reported that she does have patient's referral for Palliative Care and is planning on reaching out to patient's son, [name], today.

## 2024-08-01 NOTE — Telephone Encounter (Signed)
 This is fine

## 2024-08-01 NOTE — Telephone Encounter (Signed)
 Followed up with patient's son, [name], to let him know that [facility] will be reaching out to him today.   Also, had long discussion on the absence of legal documentation for Health Care Power of [place]. [name] states that he and another family member are both HCPOA. It was revealed to me that Randall's other family member has been sending MyChart Messages but not identifying who they are. It seems there are two parties, including Randall, who are both trying to communicate with Dr. Clovis office without being identified in patient's MyChart. I asked [name] to please work out a spokes person for Mrs. Napolitano so that we can communicate more efficiently and reduce time and confusion. Randall agreed. He also agreed to look into documentation for HCPOA and will turn in a copy should he find one.

## 2024-08-01 NOTE — Telephone Encounter (Signed)
 Copied from CRM #[id number]. Topic: Referral - Status >> Aug 01, 2024 10:11 AM Farrel B wrote: Reason for CRM: [facility] [phone]: Nidia, stated that she was calling to advise NP Caro that they have received the referral for Dr. Autumn Pack Cancer (Oncologist) and if any questions needed to be answered please feel free to call.

## 2024-08-01 NOTE — Telephone Encounter (Signed)
 Had earlier call today from Norma patient's daughter in law. She states she was out in the lobby waiting. Went out to the lobby to speak with the daughter in law and patient.  Couldn't find patient or daughter in law.  Called the daughter in law back and she states she was actually at the [facility] location.  Keene spoke with the patient and her daughter in law at [place].

## 2024-08-03 ENCOUNTER — Encounter: Payer: Self-pay | Admitting: Family

## 2024-08-03 ENCOUNTER — Ambulatory Visit: Admitting: Family

## 2024-08-03 ENCOUNTER — Telehealth: Payer: Self-pay | Admitting: Nurse Practitioner

## 2024-08-03 ENCOUNTER — Encounter: Payer: Self-pay | Admitting: Nurse Practitioner

## 2024-08-03 ENCOUNTER — Ambulatory Visit: Admitting: Nurse Practitioner

## 2024-08-03 VITALS — BP 132/78 | HR 78 | Temp 97.6°F | Resp 16 | Ht 60.0 in | Wt 92.6 lb

## 2024-08-03 VITALS — BP 120/64 | HR 71 | Ht 59.0 in | Wt 92.0 lb

## 2024-08-03 DIAGNOSIS — D649 Anemia, unspecified: Secondary | ICD-10-CM | POA: Diagnosis not present

## 2024-08-03 DIAGNOSIS — R350 Frequency of micturition: Secondary | ICD-10-CM

## 2024-08-03 DIAGNOSIS — K5900 Constipation, unspecified: Secondary | ICD-10-CM | POA: Diagnosis not present

## 2024-08-03 DIAGNOSIS — R112 Nausea with vomiting, unspecified: Secondary | ICD-10-CM | POA: Diagnosis not present

## 2024-08-03 DIAGNOSIS — F0150 Vascular dementia without behavioral disturbance: Secondary | ICD-10-CM

## 2024-08-03 DIAGNOSIS — G8929 Other chronic pain: Secondary | ICD-10-CM

## 2024-08-03 DIAGNOSIS — D638 Anemia in other chronic diseases classified elsewhere: Secondary | ICD-10-CM

## 2024-08-03 DIAGNOSIS — G893 Neoplasm related pain (acute) (chronic): Secondary | ICD-10-CM

## 2024-08-03 DIAGNOSIS — C240 Malignant neoplasm of extrahepatic bile duct: Secondary | ICD-10-CM | POA: Diagnosis not present

## 2024-08-03 DIAGNOSIS — K219 Gastro-esophageal reflux disease without esophagitis: Secondary | ICD-10-CM

## 2024-08-03 LAB — POCT URINALYSIS DIPSTICK OB
Bilirubin, UA: NEGATIVE
Blood, UA: NEGATIVE
Glucose, UA: NEGATIVE
Ketones, UA: NEGATIVE
Leukocytes, UA: NEGATIVE
Nitrite, UA: NEGATIVE
Spec Grav, UA: 1.015
Urobilinogen, UA: NEGATIVE U/dL — AB
pH, UA: 6

## 2024-08-03 MED ORDER — MORPHINE SULFATE (CONCENTRATE) 20 MG/ML PO SOLN: 10.0000 mg | ORAL | 0 refills | Status: AC | PRN

## 2024-08-03 NOTE — Progress Notes (Signed)
 "  Provider: Shlomo Seres FNP-C   [name], NP  Patient Care Team: [name], NP as PCP - General (Geriatric Medicine) Cleatus Collar, MD as Consulting Physician (Ophthalmology) Camillo Golas, MD as Consulting Physician (Ophthalmology) Winfred Curlee DEL, MD (Inactive) as Consulting Physician (Gynecology) Ardis Evalene CROME, RN as Oncology Nurse Navigator  Extended Emergency Contact Information Primary Emergency Contact: McLamb,Dalton Mobile Phone: [phone] Relation: Grandson Secondary Emergency Contact: McLamb,Norma Mobile Phone: [phone] Relation: Daughter  Code Status: Full code Goals of care: Advanced Directive information    08/03/2024   10:55 AM  Advanced Directives  Does Patient Have a Medical Advance Directive? Yes  Type of Estate Agent of [place];Living will  Does patient want to make changes to medical advance directive? No - Patient declined  Copy of Healthcare Power of Attorney in Chart? Yes - validated most recent copy scanned in chart (See row information)  Would patient like information on creating a medical advance directive? No - Patient declined     Chief Complaint  Patient presents with   Hospitalization Follow-up    History of Present Illness   Arla Boutwell Emrey Thornley is an 84 year old female with a history of cancer and recent chemotherapy who presents with severe pain management issues. She is accompanied by her grandson, Gilmer Counts and daughter.  She has been experiencing severe pain primarily in her back on the right side, which sometimes radiates to her right leg and thigh. This pain has persisted for about four weeks, intensifying since before Thanksgiving. It is described as constant and severe, significantly impacting her quality of life.  She was taken to the emergency room twice but left due to long wait times. During her hospital visit, blood work showed high glucose levels, anemia with  a hemoglobin of 11.8, and normal kidney and liver function. A CBC showed no signs of infection, and a lipase test for the pancreas was normal. An EKG performed on December 30th showed normal sinus rhythm with a heart rate of 74.  She has been on oxycodone  every six hours for pain, along with alternating doses of Tylenol  and ibuprofen. She also takes Zofran  for nausea associated with her pain medication. Despite these medications, her pain remains uncontrolled, and she desires stronger pain relief, as the current regimen is insufficient.  Her pain management has been complicated by her recent chemotherapy, which she completed at the end of the summer. She has a bile duct stent, which may be related to her pain. Her family reports that she has been unable to eat properly due to nausea and pain, consuming only small amounts of Ensure daily.  Her family is actively involved in her care, with her grandson staying with her at night and her daughter during the day. They report that she has been more active in the past, using an elliptical and driving, but her current pain has severely limited her activities. She wants to 'go to heaven' due to her pain, indicating the severity of her condition.   Past Medical History:  Diagnosis Date   Abnormal CT of the chest 11/24/2013   See CT chest 11/22/13  1. No CT evidence of pulmonary arterial embolic disease.   2. Enlarged lymph nodes in the right hilar and subcarinal region.   These may represent reactive lymph nodes, clinical correlation   recommended. There is otherwise no evidence of mediastinal or   parenchymal masses or nodules nor infiltrates.   3. 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     Depression 05/05/2007   External hemorrhoids without mention of complication 03/24/1999   GERD (gastroesophageal reflux disease) 08/02/1998   Hyperlipidemia 06/29/2006   Hypertensive emergency 09/13/2023   Memory loss 04/15/2003   Migraine without aura, without mention of intractable migraine without mention of status migrainosus 03/24/1999   Osteoporosis, unspecified 04/15/2004   Tear film insufficiency, unspecified 03/24/1999   Unspecified essential hypertension 11/22/2007   Past Surgical History:  Procedure Laterality Date   COLONOSCOPY  06-16-2007   Internal hemorrhoids,laxity of anal sphincter,polyp at 25cm form anal verge, tortuous sigmoid colon. Dr.Orr    ERCP N/A 12/21/2023   Procedure: ERCP, WITH INTERVENTION IF INDICATED;  Surgeon: Wilhelmenia Aloha Raddle., MD;  Location: [facility] ENDOSCOPY;  Service: Gastroenterology;  Laterality: N/A;   ERCP N/A 03/19/2024   Procedure: ERCP, WITH INTERVENTION IF INDICATED;  Surgeon: Wilhelmenia Aloha Raddle., MD;  Location: [facility] ENDOSCOPY;  Service: Gastroenterology;  Laterality: N/A;   ESOPHAGOGASTRODUODENOSCOPY N/A 12/21/2023   Procedure: EGD (ESOPHAGOGASTRODUODENOSCOPY);  Surgeon: Wilhelmenia Aloha Raddle., MD;  Location: [facility] ENDOSCOPY;  Service: Gastroenterology;  Laterality: N/A;   EUS N/A 12/21/2023   Procedure: ULTRASOUND, UPPER GI TRACT, ENDOSCOPIC;  Surgeon: Wilhelmenia Aloha Raddle., MD;  Location: [facility] ENDOSCOPY;  Service: Gastroenterology;  Laterality: N/A;   EYE SURGERY Bilateral 2010   cataract Dr. Camillo   FINE NEEDLE ASPIRATION  12/21/2023   Procedure: FINE NEEDLE ASPIRATION;  Surgeon: Wilhelmenia Aloha Raddle., MD;  Location: [facility] ENDOSCOPY;  Service: Gastroenterology;;   VAGINAL HYSTERECTOMY  1980    Allergies[1]  Allergies as of 08/03/2024       Reactions   Aricept  [donepezil ] Diarrhea   Vioxx [rofecoxib] Nausea Only   Pt.  Can't remember what reaction was but asks to leave on her profile.         Medication List        Accurate as of August 03, 2024  4:10 PM. If you have any questions, ask your nurse or doctor.          ADVIL PO Take 200 mg by mouth daily.   aspirin  EC 81 MG tablet Take 1 tablet (81 mg total) by mouth daily. Swallow whole.   Ensure Take 237 mLs by mouth 2 (two) times daily between meals.   famotidine  40 MG tablet Commonly known as: PEPCID  Take 1 tablet (40 mg total) by mouth daily for 14 days.   fentaNYL  50 MCG/HR Commonly known as: DURAGESIC  Place 1 patch onto the skin every 3 (three) days.   hydrALAZINE  10 MG tablet Commonly known as: APRESOLINE  Take 1 tablet (10 mg total) by mouth 2 (two) times daily as needed. For SBP >160   losartan  50 MG tablet Commonly known as: COZAAR  Take 1 tablet (50 mg total) by mouth daily.   morphine  20 MG/ML concentrated solution Commonly known as: ROXANOL Take 0.5 mLs (10 mg total) by mouth every 4 (four) hours as needed for breakthrough pain. Started by: Roxan Plough, NP   nitrofurantoin  (macrocrystal-monohydrate) 100 MG capsule Commonly known as: Macrobid  Take 1 capsule (100 mg total) by mouth 2 (  two) times daily.   ondansetron  8 MG tablet Commonly known as: ZOFRAN  Take 1 tablet (8 mg total) by mouth every 8 (eight) hours as needed for nausea or vomiting.   ONE-A-DAY WOMENS VITACRAVES PO Take by mouth daily.   oxyCODONE  5 MG immediate release tablet Commonly known as: Oxy IR/ROXICODONE  Take 1 tablet (5 mg total) by mouth every 6 (six) hours as needed for severe pain (pain score 7-10).   pantoprazole  40 MG tablet Commonly known as: PROTONIX  Take 1 tablet (40 mg total) by mouth 2 (two) times daily.   polyethylene glycol 17 g packet Commonly known as: MIRALAX / GLYCOLAX Take 17 g by mouth every other day.   PROBIOTIC DAILY PO Take by mouth daily.   prochlorperazine  10 MG tablet Commonly known as: COMPAZINE  Take 0.5  tablets (5 mg total) by mouth every 6 (six) hours as needed for refractory nausea / vomiting.   rosuvastatin  20 MG tablet Commonly known as: CRESTOR  TAKE 1 TABLET BY MOUTH EVERY DAY   sucralfate  1 g tablet Commonly known as: Carafate  Take 1 tablet (1 g total) by mouth in the morning, at noon, and at bedtime for 14 days.   TYLENOL  500 MG tablet Generic drug: acetaminophen  Take 500-1,000 mg by mouth every 8 (eight) hours as needed (for pain).        Review of Systems  Unable to perform ROS: Dementia (HPI information obtained from patient's grandson, daughter and son)    Immunization History  Administered Date(s) Administered   Fluad Quad(high Dose 65+) 04/26/2019, 04/21/2020   INFLUENZA, HIGH DOSE SEASONAL PF 03/11/2017, 06/09/2018, 03/19/2023   Influenza,inj,Quad PF,6+ Mos 07/19/2013, 05/28/2016, 06/02/2017   Influenza,inj,quad, With Preservative 06/08/2021   Influenza-Unspecified 05/23/2015   Moderna Covid-19 Fall Seasonal Vaccine 67yrs & older 06/29/2022   PFIZER(Purple Top)SARS-COV-2 Vaccination 08/22/2019, 09/12/2019, 05/29/2020   PNEUMOCOCCAL CONJUGATE-20 09/16/2022   PPD Test 11/30/2013, 05/30/2015   Pfizer Covid-19 Vaccine Bivalent Booster 60yrs & up 06/08/2021   Pneumococcal Conjugate-13 12/24/2014, 05/28/2016   Pneumococcal Polysaccharide-23 06/09/2018   Tdap 03/11/2017, 11/17/2018   Zoster Recombinant(Shingrix) 10/05/2022   Zoster, Live 11/19/2009   Pertinent  Health Maintenance Due  Topic Date Due   Influenza Vaccine  09/18/2024 (Originally 03/02/2024)   Bone Density Scan  Completed   Mammogram  Discontinued   Colonoscopy  Discontinued      04/08/2023    1:05 PM 04/22/2023   11:51 AM 12/13/2023    9:44 AM 04/17/2024   10:36 AM 08/03/2024   10:53 AM  Fall Risk  Falls in the past year? 0 0 0 0 0  Was there an injury with Fall? 0  0  0  0  0  Fall Risk Category Calculator 0 0 0 0 0  Patient at Risk for Falls Due to No Fall Risks No Fall Risks No Fall Risks No  Fall Risks No Fall Risks  Fall risk Follow up Falls evaluation completed Falls evaluation completed Falls prevention discussed;Falls evaluation completed Falls evaluation completed Falls evaluation completed     Data saved with a previous flowsheet row definition   Functional Status Survey:    Vitals:   08/03/24 1101  BP: 132/78  Pulse: 78  Resp: 16  Temp: 97.6 F (36.4 C)  SpO2: 99%  Weight: 92 lb 9.6 oz (42 kg)  Height: 5' (1.524 m)   Body mass index is 18.08 kg/m. Physical Exam  GENERAL: Alert, cooperative, well developed, moaning due to pain HEENT: Normocephalic, normal oropharynx, moist mucous membranes CHEST: Clear  to auscultation bilaterally, no wheezes, rhonchi, or crackles CARDIOVASCULAR: Normal heart rate and rhythm, S1 and S2 normal without murmurs ABDOMEN: Soft, non-tender, non-distended, without organomegaly, normal bowel sounds Musculoskeletal: flank area negative for tenderness  EXTREMITIES: No cyanosis or edema, pain on left leg raise, no pain on right leg raise NEUROLOGICAL: Cranial nerves grossly intact, moves all extremities without gross motor or sensory deficit  SKIN: No rash,no lesion or erythema   PSYCHIATRY/BEHAVIORAL: Moaning and rocking back and forth due to back pain.Holding her back throughout the visit    Labs reviewed: Recent Labs    03/26/24 0711 03/27/24 0556 03/28/24 0725 04/11/24 0901 05/18/24 1448 06/26/24 1522 07/02/24 1432 07/17/24 1830 07/17/24 1900 07/31/24 1650  NA 137 139 139 137 141   < > 135 136 136 136  K 4.0 3.5 4.0 4.1 3.9   < > 4.8 4.5 4.8 3.9  CL 109 107 107 103 105   < > 98 100 100 101  CO2 23 23 21* 26 29   < > 26 27  --  24  GLUCOSE 117* 116* 109* 157* 142*   < > 104* 113* 106* 152*  BUN 11 12 10 15 16    < > 18 20 25* 16  CREATININE 0.66 0.63 0.61 0.91 0.77   < > 0.83 0.80 0.90 0.93  CALCIUM  8.4* 8.4* 8.6* 9.5 10.0   < > 10.4* 9.7  --  10.0  MG 1.9 1.7 1.7 1.8 1.9  --   --   --   --   --   PHOS 2.2* 3.5 2.7   --   --   --   --   --   --   --    < > = values in this interval not displayed.   Recent Labs    07/02/24 1432 07/17/24 1830 07/31/24 1650  AST 34 34 34  ALT 20 25 28   ALKPHOS 92 80 79  BILITOT 0.2 <0.2 0.2  PROT 7.6 6.8 6.9  ALBUMIN 4.4 4.1 4.2   Recent Labs    07/02/24 1432 07/17/24 1830 07/17/24 1900 07/20/24 0942 07/31/24 1650  WBC 4.8 5.3  --   --  5.3  NEUTROABS 3.2 4.0  --   --  4.2  HGB 12.4 11.5* 9.2* 11.3* 11.8*  HCT 36.7 34.9* 27.0* 32.9* 34.9*  MCV 92.2 93.6  --   --  91.1  PLT 253 250  --   --  296   Lab Results  Component Value Date   TSH 5.030 (H) 09/13/2023   No results found for: HGBA1C Lab Results  Component Value Date   CHOL 190 04/08/2023   HDL 70 04/08/2023   LDLCALC 93 04/08/2023   TRIG 170 (H) 04/08/2023   CHOLHDL 2.7 04/08/2023    Significant Diagnostic Results in last 30 days:  DG Femur Min 2 Views Right Result Date: 07/30/2024 EXAM: 2 VIEW(S) XRAY OF THE FEMUR 07/30/2024 07:28:00 AM COMPARISON: None available. CLINICAL HISTORY: pain FINDINGS: BONES AND JOINTS: No acute fracture. No malalignment. SOFT TISSUES: The soft tissues are unremarkable. IMPRESSION: 1. No acute findings. Electronically signed by: Waddell Calk MD 07/30/2024 07:38 AM EST RP Workstation: [id number]   MR ABDOMEN MRCP W WO CONTAST Result Date: 07/10/2024 CLINICAL DATA:  History of biliary tract cancer/cholangiocarcinoma, status post chemoradiation. Needs restaging imaging. Pain. EXAM: MRI ABDOMEN WITHOUT AND WITH CONTRAST (INCLUDING MRCP) TECHNIQUE: Multiplanar multisequence MR imaging of the abdomen was performed both before and after the administration of intravenous contrast.  Heavily T2-weighted images of the biliary and pancreatic ducts were obtained, and three-dimensional MRCP images were rendered by post processing. CONTRAST:  4mL GADAVIST  GADOBUTROL  1 MMOL/ML IV SOLN COMPARISON:  CT angiography chest, abdomen and pelvis from 06/29/2024 and MRI abdomen from  12/18/2023. FINDINGS: Lower chest: Unremarkable MR appearance to the lung bases. No pleural effusion. No pericardial effusion. Normal heart size. Hepatobiliary: The liver is normal in size. Noncirrhotic configuration. No focal liver lesion. No intrahepatic bile duct dilation. Redemonstration of stent in the extrahepatic bile duct which exhibit air-fluid level. No discrete obstructing mass or calculus seen. Unremarkable gallbladder. Pancreas: Mild-to-moderate diffuse atrophy of pancreas. No focal lesion seen. Main pancreatic duct is not dilated. There is subtle fat stranding surrounding the pancreatic head, similar to the recent prior study. Findings are nonspecific and may represent sequela of chronic versus acute pancreatitis. Correlate clinically and with serum lipase levels. Spleen:  Within normal limits in size and appearance. No focal mass. Adrenals/Urinary Tract: Unremarkable adrenal glands. No hydroureteronephrosis. No suspicious renal mass. There are scattered, subcentimeter sized simple cortical and sinus cysts in bilateral kidneys. Stomach/Bowel: Visualized portions within the abdomen are unremarkable. No disproportionate dilation of bowel loops. Vascular/Lymphatic: No pathologically enlarged lymph nodes identified. No abdominal aortic aneurysm demonstrated. No ascites. Other:  None. Musculoskeletal: No suspicious bone lesions identified. IMPRESSION: 1. No discrete obstructing mass or calculus seen in the extrahepatic bile duct. No intrahepatic bile duct dilation. 2. There is subtle fat stranding surrounding the pancreatic head, similar to the recent prior study. Findings are nonspecific and may represent sequela of chronic versus acute pancreatitis. Correlate clinically and with serum lipase levels. 3. Multiple other nonacute observations, as described above. Electronically Signed   By: Ree Molt M.D.   On: 07/10/2024 10:40   MR 3D Recon At Scanner Result Date: 07/10/2024 CLINICAL DATA:  History  of biliary tract cancer/cholangiocarcinoma, status post chemoradiation. Needs restaging imaging. Pain. EXAM: MRI ABDOMEN WITHOUT AND WITH CONTRAST (INCLUDING MRCP) TECHNIQUE: Multiplanar multisequence MR imaging of the abdomen was performed both before and after the administration of intravenous contrast. Heavily T2-weighted images of the biliary and pancreatic ducts were obtained, and three-dimensional MRCP images were rendered by post processing. CONTRAST:  4mL GADAVIST  GADOBUTROL  1 MMOL/ML IV SOLN COMPARISON:  CT angiography chest, abdomen and pelvis from 06/29/2024 and MRI abdomen from 12/18/2023. FINDINGS: Lower chest: Unremarkable MR appearance to the lung bases. No pleural effusion. No pericardial effusion. Normal heart size. Hepatobiliary: The liver is normal in size. Noncirrhotic configuration. No focal liver lesion. No intrahepatic bile duct dilation. Redemonstration of stent in the extrahepatic bile duct which exhibit air-fluid level. No discrete obstructing mass or calculus seen. Unremarkable gallbladder. Pancreas: Mild-to-moderate diffuse atrophy of pancreas. No focal lesion seen. Main pancreatic duct is not dilated. There is subtle fat stranding surrounding the pancreatic head, similar to the recent prior study. Findings are nonspecific and may represent sequela of chronic versus acute pancreatitis. Correlate clinically and with serum lipase levels. Spleen:  Within normal limits in size and appearance. No focal mass. Adrenals/Urinary Tract: Unremarkable adrenal glands. No hydroureteronephrosis. No suspicious renal mass. There are scattered, subcentimeter sized simple cortical and sinus cysts in bilateral kidneys. Stomach/Bowel: Visualized portions within the abdomen are unremarkable. No disproportionate dilation of bowel loops. Vascular/Lymphatic: No pathologically enlarged lymph nodes identified. No abdominal aortic aneurysm demonstrated. No ascites. Other:  None. Musculoskeletal: No suspicious bone  lesions identified. IMPRESSION: 1. No discrete obstructing mass or calculus seen in the extrahepatic bile duct. No intrahepatic bile duct  dilation. 2. There is subtle fat stranding surrounding the pancreatic head, similar to the recent prior study. Findings are nonspecific and may represent sequela of chronic versus acute pancreatitis. Correlate clinically and with serum lipase levels. 3. Multiple other nonacute observations, as described above. Electronically Signed   By: Ree Molt M.D.   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 Pain has persisted for approximately four weeks, worsening despite oxycodone . Previous fentanyl  patch was withdrawn. Cancer is terminal with no surgical options. Palliative care was considered but not initiated. Hospice care is being considered for pain management and additional support. Morphine  is considered for breakthrough pain management. - Prescribed morphine  liquid (Roxanol) 20 mg/mL, 0.5 mL for breakthrough pain. - Continue oxycodone  as needed. - Discuss with oncologist on February 20th regarding pain management options, including palliative care and hospice. - Consider hospice care for comprehensive pain management and support.  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 Zofran  is used for management. Nausea worsens after meals, affecting oral intake. - Continue Zofran  30 minutes before meals to manage nausea. - Encourage small, frequent meals and use of ginger ale to settle nausea.  Urinary frequency No abdominal tenderness noted. - Obtained urine specimen to rule out UTI.  Anemia of chronic disease Anemia noted with hemoglobin at 11.8 g/dL. No acute intervention required as per recent  hospital evaluation.  Dementia Present. - Continue current management and monitoring. - continue with supportive care   Family/ staff Communication: Reviewed plan of care with patient, daughter, son and grandson  Labs/tests ordered:  - Urine Culture; Future - POC Urinalysis Dipstick    Next Appointment : Return in about 4 months (around 12/01/2024) for medical mangement of chronic issues with PCP .SABRA   Spent 30 minutes of Face to face and non-face to face with patient  >50% time spent counseling; reviewing medical record; tests; labs; documentation and developing future plan of care.   Julaine Zimny C Stephine Langbehn, NP      [1]  Allergies Allergen Reactions   Aricept  [Donepezil ] Diarrhea   Vioxx [Rofecoxib] Nausea Only    Pt. Can't remember what reaction was but asks to leave on her profile.    "

## 2024-08-03 NOTE — Patient Instructions (Signed)
 1.Stop at check out & schedule Annual Wellness Visit.

## 2024-08-03 NOTE — Telephone Encounter (Signed)
 Discussed with pts son that she tested negative for H pylori. Pt scheduled to see Elida Shawl NP today at 2pm. He is aware of appt.

## 2024-08-03 NOTE — Telephone Encounter (Signed)
 SABRA

## 2024-08-03 NOTE — Progress Notes (Signed)
 "    08/03/2024 Caroline Snyder [id number] August 29, 1940   Chief Complaint: Nausea, pain under the right breast and right flank area  History of Present Illness:  Caroline Snyder is an 84 year old female with a history of vascular dementia, H. 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 Autumn.  She is known by Dr. Leigh.    She was previously admitted to the hospital at the end of May with elevated liver enzymes and abnormal imaging in the setting of poor p.o. intake, nausea vomiting. MRCP showed moderate to severe intrahepatic ductal dilation concerning for a stricture at the head of the pancreas, as well as dilated pancreatic duct with concern for pancreatic mass. She underwent an EGD/EUS/ERCP with Dr. Wilhelmenia on May 21. Biopsies of her stomach were remarkable for H. pylori for which she was treated. EUS showed pancreatic mass, biopsies obtained, ERCP was successful with placement of a plastic stent. Her liver enzymes normalized.    She was evaluated by surgery and oncology, ultimately declined a Whipple procedure (she has localized disease and was determined to be a surgical candidate), they have elected to proceed with radiation and chemotherapy.   She underwent follow-up ERCP with Dr. Wilhelmenia 03/19/2024, the prior biliary stent was removed and a single moderate biliary stricture was identified in the middle third of the main bile duct, stricture was malignant appearing, the biliary tree was swept and sludge was found and 1 uncovered metal biliary stent was placed in the CBD to traverse the stricture.   She was admitted to the hospital 8/15 - 03/22/2024 with N/V and diarrhea.  CTAP concerning for colitis.  Stool studies were negative.  Empirically on IV antibiotics.   She was subsequently admitted to the hospital 8/22 - 8/27/20225 with N/V and diarrhea. CT abdomen and pelvis obtained with colonic  wall thickening and pericolonic edema extending from the cecum through the transverse colon and also involving the sigmoid colon consistent with colitis of infectious, inflammatory or ischemic etiology. Fluid-filled small bowel in the right lower quadrant with mild mucosal enhancement of terminal ileum consistent with ileitis. Common bile duct stent in place with pneumobilia. No biliary distention. Indistinct hazy soft tissue density at the porta hepatis, surrounding common bile duct stent and now with interval focal narrowed appearance of the portal vein and presumably related to history of known bile duct malignancy. No evidence of portal vein thrombosis or occlusion. Hepatic steatosis. Aortic atherosclerosis.  GI pathogen and C. difficile PCR were negative.  She was treated with IV antibiotics then transition to oral Augmentin  probiotic.  Palliative care was consulted.  Patient's clinical status stabilized and she was discharged home 03/28/2024.   She was last seen by her oncologist, Dr. Autumn, 07/16/2024 with ongoing nausea, vomiting, right chest wall and back pain.  No evidence of recurrent cancer per recent CT.  She underwent abdominal MRI/MRCP with and without contrast 07/10/2024 which did not identify any obstructing mass or intra hepatic/extrahepatic biliary ductal dilatation and there was subtle fat stranding surrounding pancreatic head, unchanged when compared to prior imaging possible sequela of chronic versus acute pancreatitis.   I saw patient in office 07/20/2024.  At that time, she was accompanied by her son [name] who provided most of the patient's symptom history.  She endorsed having recurrent nausea and vomiting with pain under the right breast which radiates to the right flank and right mid back for the past 3 weeks.  She  continued to have constant nausea with intermittent vomiting, emesis clear to bilious fluid or partially digested food several times weekly.  No coffee-ground or frank  hematemesis. Her son stated she started taking Oxycodone  on every 6 hours 12/18 as recommended by her oncologist, Dr. Autumn.  She last took Oxycodone  at 8 AM this morning.  No diarrhea.  She is passing normal bowel movement most days, last BM was yesterday.  No bloody or black stools.  Patient endorsed having right flank pain x 3 weeks, therefore I ordered a urinalysis which was positive for E. coli.  I contacted Harlene An primary care NP to treat E. coli UTI, she deferred treatment to our GI service.  She was prescribed Nitrofurantoin  100 mg twice daily for 5 days per Dr. Leigh.  Labs 07/20/2024 showed hemoglobin level 11.3.  BUN 25.  Creatinine 0.90.  [employer] H. pylori stool antigen was negative 07/27/2024.  I also contacted oncologist Dr. Autumn regarding my concern the patient appeared to be failure to thrive and he felt this was an abrupt change from when he saw her last and recommended follow-up with PCP to determine if dementia was contributing to her current presentation.  She presented to the ED 12/29 - 07/31/2024 with right upper leg pain x 3 days, described as cramp-like which became unbearable.  Patient left the ED due to a long wait before being seen by the ED provider.    She presents today per son's request for a follow up appointment.  Currently, she appears more awake and less lethargic when I saw her on 07/20/2024.  Last dose of Oxycodone  was 7 hours ago.  She was seen by her PCP earlier today who is switching her pain meds to Morphine  per family's report.  She also has a lidocaine  patches which the family places as needed.  Patient's daughter-in-law stated patient was moaning out loud with right sided abdominal/flank pain earlier today.  She continues to have right sided flank pain and pain under the right breast with daily nausea without recent vomiting.  LFTs have remained normal.  MRI/MRCP 07/10/2024 results were unrevealing as noted above.  I explained to the patient,  her son and daughter-in-law that her biliary stent is not obstructed based on her lab and MRI/MRCP results.  Her family stated her right sided breast and flank pain started shortly after the biliary stent was initially placed.  She is taking Ondansetron  8 mg every 8 hours as needed and Compazine  10 mg 1 tab every 6 hours as needed.  She has intermittent acid reflux symptoms and increased belching.  Her son stated she is taking Pantoprazole  40 mg once daily, sometimes twice daily.  She is not taking Famotidine . She is taking Miralax every other day, past day large normal formed stool earlier today.  No bloody or black stools.      Latest Ref Rng & Units 07/31/2024    4:50 PM 07/20/2024    9:42 AM 07/17/2024    7:00 PM  CBC  WBC 4.0 - 10.5 K/uL 5.3     Hemoglobin 12.0 - 15.0 g/dL 88.1  88.6  9.2   Hematocrit 36.0 - 46.0 % 34.9  32.9  27.0   Platelets 150 - 400 K/uL 296            Latest Ref Rng & Units 07/31/2024    4:50 PM 07/17/2024    7:00 PM 07/17/2024    6:30 PM  CMP  Glucose 70 - 99 mg/dL 847  106  113   BUN 8 - 23 mg/dL 16  25  20    Creatinine 0.44 - 1.00 mg/dL 9.06  9.09  9.19   Sodium 135 - 145 mmol/L 136  136  136   Potassium 3.5 - 5.1 mmol/L 3.9  4.8  4.5   Chloride 98 - 111 mmol/L 101  100  100   CO2 22 - 32 mmol/L 24   27   Calcium  8.9 - 10.3 mg/dL 89.9   9.7   Total Protein 6.5 - 8.1 g/dL 6.9   6.8   Total Bilirubin 0.0 - 1.2 mg/dL 0.2   <9.7   Alkaline Phos 38 - 126 U/L 79   80   AST 15 - 41 U/L 34   34   ALT 0 - 44 U/L 28   25     Medications Ordered Prior to Encounter[1] Allergies[2]  Current Medications, Allergies, Past Medical History, Past Surgical History, Family History and Social History were reviewed in [employer] record.  Review of Systems:   Constitutional: + Weight loss.  Respiratory: Negative for shortness of breath.   Cardiovascular: Negative for chest pain, palpitations and leg swelling.  Gastrointestinal: See HPI.   Musculoskeletal: Negative for back pain or muscle aches.  Neurological: Negative for dizziness, headaches or paresthesias.   Physical Exam: BP 120/64   Pulse 71   Ht 4' 11 (1.499 m)   Wt 92 lb (41.7 kg)   SpO2 99%   BMI 18.58 kg/m   Wt Readings from Last 3 Encounters:  08/03/24 92 lb (41.7 kg)  08/03/24 92 lb 9.6 oz (42 kg)  07/31/24 92 lb 9.5 oz (42 kg)    General: Pale complected 84 year old female in no acute distress. Head: Normocephalic and atraumatic. Eyes: No scleral icterus. Conjunctiva pink . Ears: Normal auditory acuity. Mouth: Dentition intact. No ulcers or lesions.  Lungs: Clear throughout to auscultation. Heart: Regular rate and rhythm, no murmur. Abdomen: Soft, nontender and nondistended. No masses or hepatomegaly. Normal bowel sounds x 4 quadrants.  Rectal: Deferred. Musculoskeletal: Symmetrical with no gross deformities. Extremities: No edema. Neurological: Alert oriented x 4. No focal deficits.  Psychological: Alert and cooperative. 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  Patient did not wish to pursue surgical intervention.  Initial ERCP with biliary stent placement 12/2023. S/P ERCP with biliary stent exchange 03/19/2024. Presents with N/V, right chest/flank and back pain x 3 weeks. MRI/MRCP showed no evidence of a biliary obstructing mass or intrahepatic/extrahepatic biliary ductal dilatation.  Normal LFTs.  Patient is not demonstrating any active abdominal/flank/back pain at this time. - Defer pain management to oncology and PCP, query nerve block? - Ensure 2 cans daily - Courage 3 small snack sized meals daily  N/V, opioids likely contributing to persistent nausea - Upper GI series to rule out gastric outlet obstruction or stenosis.  - Consider future EGD, await UGI series results  -  Continue Ondansetron  8 mg p.o. every 6 to 8 hours as needed and Compazine  10 mg p.o. twice daily as needed   H. Pylori identified per EGD at time of ERCP 12/2023, treated with eradication. [employer] H. Pylori stool antigen negative 07/27/2024.    Normocytic anemia. Hg 11.5 -> 9.2 (likely dilutional). No overt GI bleeding.    GERD, intermittent reflux symptoms.  Patient currently taking Pantoprazole  once daily, sometimes twice daily. -  Pantoprazole  40 mg twice daily to be taken 30 minutes before breakfast and dinner  Constipation -MiraLAX nightly, may take twice daily if needed       [1]  Current Outpatient Medications on File Prior to Visit  Medication Sig Dispense Refill   acetaminophen  (TYLENOL ) 500 MG tablet Take 500-1,000 mg by mouth every 8 (eight) hours as needed (for pain).     aspirin  EC 81 MG tablet Take 1 tablet (81 mg total) by mouth daily. Swallow whole. 30 tablet 11   Ensure (ENSURE) Take 237 mLs by mouth 2 (two) times daily between meals.     famotidine  (PEPCID ) 40 MG tablet Take 1 tablet (40 mg total) by mouth daily for 14 days. 14 tablet 0   hydrALAZINE  (APRESOLINE ) 10 MG tablet Take 1 tablet (10 mg total) by mouth 2 (two) times daily as needed. For SBP >160 20 tablet 0   Ibuprofen (ADVIL PO) Take 200 mg by mouth daily.     losartan  (COZAAR ) 50 MG tablet Take 1 tablet (50 mg total) by mouth daily. 90 tablet 1   morphine  (ROXANOL) 20 MG/ML concentrated solution Take 0.5 mLs (10 mg total) by mouth every 4 (four) hours as needed for breakthrough pain. 30 mL 0   Multiple Vitamins-Minerals (ONE-A-DAY WOMENS VITACRAVES PO) Take by mouth daily.     nitrofurantoin , macrocrystal-monohydrate, (MACROBID ) 100 MG capsule Take 1 capsule (100 mg total) by mouth 2 (two) times daily. 10 capsule 0   ondansetron  (ZOFRAN ) 8 MG tablet Take 1 tablet (8 mg total) by mouth every 8 (eight) hours as needed for nausea or vomiting. 30 tablet 3   oxyCODONE  (OXY IR/ROXICODONE ) 5 MG immediate release  tablet Take 1 tablet (5 mg total) by mouth every 6 (six) hours as needed for severe pain (pain score 7-10). 60 tablet 0   pantoprazole  (PROTONIX ) 40 MG tablet Take 1 tablet (40 mg total) by mouth 2 (two) times daily. 60 tablet 1   polyethylene glycol (MIRALAX / GLYCOLAX) 17 g packet Take 17 g by mouth every other day.     Probiotic Product (PROBIOTIC DAILY PO) Take by mouth daily.     prochlorperazine  (COMPAZINE ) 10 MG tablet Take 0.5 tablets (5 mg total) by mouth every 6 (six) hours as needed for refractory nausea / vomiting. 15 tablet 0   rosuvastatin  (CRESTOR ) 20 MG tablet TAKE 1 TABLET BY MOUTH EVERY DAY 90 tablet 1   fentaNYL  (DURAGESIC ) 50 MCG/HR Place 1 patch onto the skin every 3 (three) days. (Patient not taking: Reported on 08/03/2024) 10 patch 0   sucralfate  (CARAFATE ) 1 g tablet Take 1 tablet (1 g total) by mouth in the morning, at noon, and at bedtime for 14 days. (Patient not taking: Reported on 08/03/2024) 42 tablet 0   Current Facility-Administered Medications on File Prior to Visit  Medication Dose Route Frequency Provider Last Rate Last Admin   denosumab  (PROLIA ) injection 60 mg  60 mg Subcutaneous Once Eubanks, Jessica K, NP      [2]  Allergies Allergen Reactions   Aricept  [Donepezil ] Diarrhea   Vioxx [Rofecoxib] Nausea Only    Pt. Can't remember what reaction was but asks to leave on her profile.    "

## 2024-08-03 NOTE — Telephone Encounter (Signed)
 Inbound call from patients son requesting a call back from the nurse to discuss if patients results are back. Also requesting to discuss if patient can be seen before 1/20. He states she is still not well and needs to be seen. Please advise.

## 2024-08-03 NOTE — Telephone Encounter (Signed)
 I will inform patient about negative H. Pylori stool test at time of office appointment today at 2pm.

## 2024-08-03 NOTE — Patient Instructions (Signed)
 Make sure to take your Pantoprazole  40mg  twice a day - 30 minutes before breakfast and dinner.  You have been scheduled for an Upper GI Series at [facility]. Your appointment is on 08/21/2024 at 9:00am. Please arrive 30 minutes prior to your test for registration. Make sure not to eat or drink anything after midnight on the night before your test. If you need to reschedule, please call radiology at [phone]. ________________________________________________________________ An upper GI series uses x rays to help diagnose problems of the upper GI tract, which includes the esophagus, stomach, and duodenum. The duodenum is the first part of the small intestine. An upper GI series is conducted by a radiology technologist or a radiologist--a doctor who specializes in x-ray imaging--at a hospital or outpatient center. While sitting or standing in front of an x-ray machine, the patient drinks barium liquid, which is often white and has a chalky consistency and taste. The barium liquid coats the lining of the upper GI tract and makes signs of disease show up more clearly on x rays. X-ray video, called fluoroscopy, is used to view the barium liquid moving through the esophagus, stomach, and duodenum. Additional x rays and fluoroscopy are performed while the patient lies on an x-ray table. To fully coat the upper GI tract with barium liquid, the technologist or radiologist may press on the abdomen or ask the patient to change position. Patients hold still in various positions, allowing the technologist or radiologist to take x rays of the upper GI tract at different angles. If a technologist conducts the upper GI series, a radiologist will later examine the images to look for problems.  This test typically takes about 1 hour to complete. __________________________________________________________________   _______________________________________________________  If your blood pressure at your visit was 140/90  or greater, please contact your primary care physician to follow up on this.  _______________________________________________________  If you are age 29 or older, your body mass index should be between 23-30. Your Body mass index is 18.58 kg/m. If this is out of the aforementioned range listed, please consider follow up with your Primary Care Provider.  If you are age 85 or younger, your body mass index should be between 19-25. Your Body mass index is 18.58 kg/m. If this is out of the aformentioned range listed, please consider follow up with your Primary Care Provider.   ________________________________________________________  The [facility] providers would like to encourage you to use MYCHART to communicate with providers for non-urgent requests or questions.  Due to long hold times on the telephone, sending your provider a message by [facility] may be a faster and more efficient way to get a response.  Please allow 48 business hours for a response.  Please remember that this is for non-urgent requests.  _______________________________________________________  [facility] is using a team-based approach to care.  Your team is made up of your doctor and two to three APPS. Our APPS (Nurse Practitioners and Physician Assistants) work with your physician to ensure care continuity for you. They are fully qualified to address your health concerns and develop a treatment plan. They communicate directly with your gastroenterologist to care for you. Seeing the Advanced Practice Practitioners on your physician's team can help you by facilitating care more promptly, often allowing for earlier appointments, access to diagnostic testing, procedures, and other specialty referrals.

## 2024-08-04 ENCOUNTER — Other Ambulatory Visit: Payer: Self-pay | Admitting: Oncology

## 2024-08-04 DIAGNOSIS — R112 Nausea with vomiting, unspecified: Secondary | ICD-10-CM

## 2024-08-04 LAB — URINE CULTURE
MICRO NUMBER:: 17419746
SPECIMEN QUALITY:: ADEQUATE

## 2024-08-05 ENCOUNTER — Other Ambulatory Visit: Payer: Self-pay | Admitting: Nurse Practitioner

## 2024-08-05 DIAGNOSIS — I10 Essential (primary) hypertension: Secondary | ICD-10-CM

## 2024-08-05 NOTE — Progress Notes (Signed)
 Agree with assessment and plan as outlined. Await upper GI series. Difficult situation - would try to get this patient off chronic narcotics if at all possible given her age and intermittent upper tract symptoms

## 2024-08-06 ENCOUNTER — Telehealth: Payer: Self-pay | Admitting: *Deleted

## 2024-08-06 ENCOUNTER — Ambulatory Visit: Payer: Self-pay | Admitting: Family

## 2024-08-06 NOTE — Telephone Encounter (Signed)
Tried calling patient. LMOM to return call.  °

## 2024-08-06 NOTE — Telephone Encounter (Signed)
 Copied from CRM [id number]. Topic: Referral - Request for Referral >> Aug 06, 2024  8:27 AM Antonio DEL wrote: Did the patient discuss referral with their provider in the last year? Yes (If No - schedule appointment) (If Yes - send message)  Appointment offered? No  Type of order/referral and detailed reason for visit: Ortho for pain  Preference of office, provider, location: Herlene Calix or Cordella Hutchinson at [facility]  If referral order, have you been seen by this specialty before? Yes (If Yes, this issue or another issue? When? Where? 2023  Can we respond through MyChart? Yes

## 2024-08-06 NOTE — Telephone Encounter (Signed)
 Need more information regarding pain- she just saw Dinah on Friday

## 2024-08-06 NOTE — Telephone Encounter (Signed)
 For Right Leg pain she has been having a week and a half. Pain is mainly in upper thigh. No trauma. No redness. No warmth.   Also requesting a refill on BP medication. Stated that she is taking Losartan  100mg  daily now. Not sure which Dr. Jayson it. Not in Current medication list.   Please Advise.

## 2024-08-06 NOTE — Progress Notes (Signed)
 [name]/[name]:  Pls contact patient's son and let him know Dr. Hassan input below, recommended minimizing narcotics and he did not think she would be a candidate for a nerve block to control her abd/flank/chest pain.   I will contact patient and son once I receive her UGI results. THX.

## 2024-08-06 NOTE — Telephone Encounter (Signed)
 Tried calling. LMOM to return call.

## 2024-08-06 NOTE — Telephone Encounter (Signed)
 Dinah saw her on 12/2- it appears this pain is coming from her back and cancer related pain.  She can go to orthopedic for second opinion if she wants she does not need a referral.   She is current on losartan  but dose is 50 mg daily at this time.

## 2024-08-06 NOTE — Progress Notes (Signed)
 Spoke with pts son and he is aware nerve block is not an option and is aware of recommendations per Dr. Leigh.

## 2024-08-06 NOTE — Telephone Encounter (Signed)
Patient son notified and agreed.  ?

## 2024-08-07 ENCOUNTER — Inpatient Hospital Stay

## 2024-08-07 ENCOUNTER — Inpatient Hospital Stay: Attending: Oncology | Admitting: Oncology

## 2024-08-07 ENCOUNTER — Encounter: Payer: Self-pay | Admitting: Oncology

## 2024-08-07 ENCOUNTER — Encounter: Admitting: Physician Assistant

## 2024-08-07 VITALS — BP 129/59 | HR 71 | Temp 98.1°F | Resp 18 | Ht 59.0 in | Wt 91.0 lb

## 2024-08-07 DIAGNOSIS — C240 Malignant neoplasm of extrahepatic bile duct: Secondary | ICD-10-CM

## 2024-08-07 LAB — CMP (CANCER CENTER ONLY)
ALT: 30 U/L (ref 0–44)
AST: 37 U/L (ref 15–41)
Albumin: 4 g/dL (ref 3.5–5.0)
Alkaline Phosphatase: 77 U/L (ref 38–126)
Anion gap: 10 (ref 5–15)
BUN: 18 mg/dL (ref 8–23)
CO2: 27 mmol/L (ref 22–32)
Calcium: 9.9 mg/dL (ref 8.9–10.3)
Chloride: 100 mmol/L (ref 98–111)
Creatinine: 1.09 mg/dL — ABNORMAL HIGH (ref 0.44–1.00)
GFR, Estimated: 50 mL/min — ABNORMAL LOW
Glucose, Bld: 81 mg/dL (ref 70–99)
Potassium: 4.4 mmol/L (ref 3.5–5.1)
Sodium: 137 mmol/L (ref 135–145)
Total Bilirubin: <0.2 mg/dL (ref 0.0–1.2)
Total Protein: 6.5 g/dL (ref 6.5–8.1)

## 2024-08-07 LAB — CBC WITH DIFFERENTIAL (CANCER CENTER ONLY)
Abs Immature Granulocytes: 0.03 K/uL (ref 0.00–0.07)
Basophils Absolute: 0 K/uL (ref 0.0–0.1)
Basophils Relative: 1 %
Eosinophils Absolute: 0 K/uL (ref 0.0–0.5)
Eosinophils Relative: 1 %
HCT: 30.7 % — ABNORMAL LOW (ref 36.0–46.0)
Hemoglobin: 10.4 g/dL — ABNORMAL LOW (ref 12.0–15.0)
Immature Granulocytes: 1 %
Lymphocytes Relative: 21 %
Lymphs Abs: 0.7 K/uL (ref 0.7–4.0)
MCH: 30.7 pg (ref 26.0–34.0)
MCHC: 33.9 g/dL (ref 30.0–36.0)
MCV: 90.6 fL (ref 80.0–100.0)
Monocytes Absolute: 0.3 K/uL (ref 0.1–1.0)
Monocytes Relative: 9 %
Neutro Abs: 2.3 K/uL (ref 1.7–7.7)
Neutrophils Relative %: 67 %
Platelet Count: 236 K/uL (ref 150–400)
RBC: 3.39 MIL/uL — ABNORMAL LOW (ref 3.87–5.11)
RDW: 12.5 % (ref 11.5–15.5)
WBC Count: 3.4 K/uL — ABNORMAL LOW (ref 4.0–10.5)
nRBC: 0 % (ref 0.0–0.2)

## 2024-08-07 LAB — CEA (ACCESS): CEA (CHCC): 3.82 ng/mL (ref 0.00–5.00)

## 2024-08-07 LAB — MAGNESIUM: Magnesium: 2 mg/dL (ref 1.7–2.4)

## 2024-08-07 NOTE — Progress Notes (Signed)
 "  [facility]  ONCOLOGY CLINIC PROGRESS NOTE   Patient Care Team: [name], NP as PCP - General (Geriatric Medicine) Cleatus Collar, MD as Consulting Physician (Ophthalmology) Camillo Golas, MD as Consulting Physician (Ophthalmology) Winfred Curlee DEL, MD (Inactive) as Consulting Physician (Gynecology) Ardis Evalene CROME, RN as Oncology Nurse Navigator  PATIENT NAME: Caroline Snyder   MR#: [id number] DOB: [birth year]  Date of visit: 08/07/2024   ASSESSMENT & PLAN:   Caroline Snyder is a 84 y.o.  lady with a past medical history of vascular dementia, HTN, HLD, depression, migraines and GERD, was referred to our clinic for newly diagnosed adenocarcinoma of the biliary duct.   Primary cholangiocarcinoma of extrahepatic bile duct (HCC) Please review oncology history for additional details and timeline of events.    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  IHC stain for H. pylori was positive.   On 12/22/2023 CT chest with contrast showed no evidence of intrathoracic metastatic disease.  Clinically it appeared to be cT2-cT3, cN0, cM0 disease.  The cancer wass localized to the common bile duct with no evidence of metastasis in the chest or abdomen.   She did have consultation with Dr. Dasie.  Dr. Dasie did offer surgery as an option but because of the local extent of the disease, she will need to undergo Whipple surgery.  Patient and family members would like for her to avoid surgery as they are worried about her postoperative recovery from Whipple surgery.  They opted to proceed with concurrent chemoradiation instead. Treatment aims to control disease, not cure.   She began concurrent chemoradiation with capecitabine  from 02/13/2024.  Capecitabine  was given at a dose of 800  mg/m twice daily on Monday to Friday during the course of radiation.  This translated to 1000 mg p.o. twice daily dosing for her. She completed treatments on 03/22/2024.  She underwent biliary stent exchange on 03/19/2024.  The stent is functioning well, with normal bilirubin and liver function tests.   She had to be directed to the ED for persistent abdominal pain and a CT scan on 04/11/2024 showed patent biliary stent and no evidence of disease progression.  It showed treatment-related changes.  She was taken to the ED by her family members on 06/29/2024 for nausea, vomiting and right sided chest pain.  CT chest, abdomen pelvis with angiogram was obtained which showed no evidence of acute process.  It also showed no evidence of residual disease.  On 07/10/2024, restaging MRI of the abdomen showed no discrete obstructing mass or calculus seen in the extrahepatic bile duct. No intrahepatic bile duct dilation. There is subtle fat stranding surrounding the pancreatic head, similar to the recent prior study. Findings are nonspecific and may represent sequela of chronic versus acute pancreatitis.  Overall no evidence of disease.  Will continue surveillance.  No current concerns related to the stent or tumor progression based on recent imaging.  Clinically she is looking a lot better currently.  Most of her pain issues seem to be from arthritis, scoliosis and possible sciatica.  CEA is normal at 3 recently.  CA 19-9 was better at 55, compared to peak of 180.  LFTs are all normal.  CBCD unremarkable.  RTC in 6 weeks for follow-up with repeat labs.  Chronic back and leg pain due to scoliosis, arthritis, and possible  sciatica Chronic pain is multifactorial, attributed to scoliosis, arthritis, and possible sciatica, without evidence of malignancy or recurrence on recent imaging and tumor markers. Pain is aggravated by sitting and alleviated by position changes. Imaging and pain pattern are not consistent with  cancer recurrence. Narcotics are avoided due to gastrointestinal side effects and limited efficacy. NSAIDs and acetaminophen  are used for symptomatic relief, with consideration of renal and gastrointestinal risks. - Discontinued narcotics due to gastrointestinal side effects and limited benefit. - Recommended acetaminophen  extra strength (500 mg) or acetaminophen  extended release (650 mg) at night for analgesia. - Advised alternating acetaminophen  and ibuprofen for pain control, limiting ibuprofen to 1-2 tablets daily and maintaining adequate hydration to reduce renal risk. - Advised against acetaminophen  PM due to diphenhydramine content. - Discussed naproxen as an alternative NSAID, with caution regarding gastrointestinal and renal adverse effects. - Recommended use of a ring-type or sciatica pillow for sitting to decrease pressure on affected areas. - Encouraged physical therapy for sciatica and musculoskeletal pain, to be coordinated with orthopedics. - Advised follow-up with orthopedics for further evaluation and management of sciatica and musculoskeletal pain. - Advised follow-up with gastroenterology for ongoing gastrointestinal management and medication adjustments. - Planned follow-up in six weeks to reassess pain and overall status.  Leukopenia Chronic mild leukopenia with stable white blood cell counts, no evidence of infection or acute hematologic abnormalities, and stable platelets and hemoglobin. No active bleeding or other cytopenias.  Will monitor this for now.  I reviewed lab results and outside records for this visit and discussed relevant results with the patient. Diagnosis, plan of care and treatment options were also discussed in detail with the patient. Opportunity provided to ask questions and answers provided to her apparent satisfaction. Provided instructions to call our clinic with any problems, questions or concerns prior to return visit. I recommended to continue follow-up  with PCP and sub-specialists. She verbalized understanding and agreed with the plan.   NCCN guidelines have been consulted in the planning of this patients care.  I spent a total of 32 minutes during this encounter with the patient including review of chart and various tests results, discussions about plan of care and coordination of care plan.   Chinita Patten, MD  08/07/2024 6:20 PM  [facility] - A DEPT OF [facility] [address] [place] [address] Dept: [phone] Dept Fax: [phone]    CHIEF COMPLAINT/ REASON FOR VISIT:   Cholangiocarcinoma of extrahepatic biliary duct  Current Treatment: Patient did not want to proceed with surgery.  Plan made to treat her with concurrent chemoradiation with capecitabine . She began concurrent chemoradiation with capecitabine  from 02/13/2024 and completed treatments on 03/22/2024.  INTERVAL HISTORY:    Discussed the use of AI scribe software for clinical note transcription with the patient, who gave verbal consent to proceed.  History of Present Illness Caroline Snyder is an 84 year old female with extrahepatic cholangiocarcinoma status post surgical resection and stent placement, currently with no evidence of disease, who presents for oncology follow-up regarding persistent back, abdominal, and lower extremity pain.  She has experienced persistent pain since her surgical procedure, with discomfort localized to the mid-back, submammary region, and occasionally the anterior abdomen. A new component of right thigh and leg pain developed in late December, with left leg pain first noted last night. The pain is sometimes radiating but is currently most prominent in the mid-back. She describes increased sensitivity to touch in affected areas.  Sitting, particularly on hard surfaces, exacerbates her back pain. Stiffness and pain are most pronounced at night and  in the mornings, especially after prolonged sitting. No current radiation of pain down the leg is noted during this visit.  She has presented to the emergency department three times since mid-December for pain and has been evaluated by multiple providers. Imaging studies, including CT chest/abdomen/pelvis (September and November) and MRI abdomen (December), have been performed. Recent laboratory results include CA 19-9 and CEA values. No bone scan has been performed. She denies abnormal bleeding, bruising, or constitutional symptoms.  Pain management has been challenging. GI specialists recommended discontinuing narcotics due to gastrointestinal side effects and limited efficacy. She currently uses acetaminophen  (1000 mg twice daily) and ibuprofen (200 mg, one to two tablets per day, alternating with acetaminophen  every six hours) for pain control. She also uses an acid-reducing medication twice daily, which has improved her gastrointestinal symptoms. She denies symptoms of blood loss. Her weight has increased from 85 lbs after radiation to 91-92 lbs currently.  She has a history of scoliosis diagnosed in 2023 and arthritis, with prior episodes of leg pain. Stiffness and pain are noted after prolonged sitting or at night. No fevers are reported. Laboratory studies show stable hemoglobin (10.4, previously 9.2), a white blood cell count of 3,400, and creatinine of 1.09.    I have reviewed the past medical history, past surgical history, social history and family history with the patient and they are unchanged from previous note.  HISTORY OF PRESENT ILLNESS:   ONCOLOGY HISTORY:   84 y.o. lady with a past medical history of vascular dementia, HTN, HLD, depression, migraines and GERD, was admitted to the hospital on 12/18/2023 after she presented with intractable nausea and vomiting.  She was noted to be hypertensive and tachypneic.  Labs showed  AST/ALT 300s, and Alk Phos 315. RUQ US  with CBD dilation.   She was admitted for further workup and evaluation.   On 12/18/2023, MRCP showed moderate to severe intrahepatic bile duct dilatation.  There was an abrupt beak like narrowing of the mid common bile duct at the level of the head of the pancreas.  Findings concerning for stricture at the level of head of pancreas.  Mild diffuse edema within the pancreatic parenchyma noted, which was concerning for pancreatitis. Mild increase caliber of the main pancreatic duct which measured up to 4 mm at the level of the pancreatic neck, which abruptly decreases in caliber at the head of pancreas. Focal area of mild increased T2 signal with restricted diffusion in the anterior head of pancreas measures 2.2 x 1.2 by 2.2 cm. Cannot exclude pancreatic head neoplasm. Consider further evaluation with endoscopic ultrasound and tissue sampling.   CA 19-9 was increased at 180 on 12/19/2023.   On 12/21/2023, Dr. Wilhelmenia performed ERCP.  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  Stricture was malignant appearing.  The upper third of the main bile duct, left main hepatic duct and right main hepatic duct were moderately dilated, secondary to a stricture.  Extremely difficult cannulation requiring attempt at double wire, over pancreatic stent, precut fistulotomy to eventually gain biliary access. One temporary plastic biliary stent was placed into the ventral pancreatic duct.  One plastic biliary stent was placed in the CBD to traverse the stricture.   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  IHC stain for H. pylori was positive.     On 12/22/2023 CT chest with contrast showed no evidence of intrathoracic metastatic disease.   Patient presented to clinic on 12/29/2023 to establish care with us .  She was in  significant amount of abdominal/right chest wall pain and had to be directed to ED for further evaluation and admission.   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    She did have consultation with Dr. Dasie.  Dr. Dasie did offer surgery as an option but because of the local extent of the disease, she will need to undergo Whipple surgery.   Patient and family members would like for her to avoid surgery as they are worried about her postoperative recovery from Whipple surgery.  They would like to proceed with concurrent chemoradiation instead.   Plan made to proceed with concurrent chemoradiation.  Capecitabine  will be given a dose of 800 mg/m twice daily on Monday to Friday during the course of radiation.  This translated to 1000 mg p.o. twice daily dosing for her.   She began concurrent chemoradiation with capecitabine  from 02/13/2024. She completed treatments on 03/22/2024.  Restaging CT scan on 04/11/2024 showed no evidence of disease progression.  Repeat CT of the chest, abdomen pelvis on 06/29/2024 in the ER showed no evidence of residual disease.  On 07/10/2024, restaging MRI of the abdomen showed no discrete obstructing mass or calculus seen in the extrahepatic bile duct. No intrahepatic bile duct dilation. There is subtle fat stranding surrounding the pancreatic head, similar to the recent prior study. Findings are nonspecific and may represent sequela of chronic versus acute pancreatitis.  Overall no evidence of disease.  Will continue surveillance.   REVIEW OF SYSTEMS:   Review of Systems - Oncology  All other pertinent systems were reviewed with the patient and are negative.  ALLERGIES: She is allergic to aricept  [donepezil ] and vioxx [rofecoxib].  MEDICATIONS:  Current Outpatient Medications  Medication Sig Dispense Refill   acetaminophen  (TYLENOL ) 500 MG  tablet Take 500-1,000 mg by mouth every 8 (eight) hours as needed (for pain).     aspirin  EC 81 MG tablet Take 1 tablet (81 mg total) by mouth daily. Swallow whole. 30 tablet 11   Ensure (ENSURE) Take 237 mLs by mouth 2 (two) times daily between meals.     famotidine  (PEPCID ) 40 MG tablet Take 1 tablet (40 mg total) by mouth daily for 14 days. 14 tablet 0   hydrALAZINE  (APRESOLINE ) 10 MG tablet Take 1 tablet (10 mg total) by mouth 2 (two) times daily as needed. For SBP >160 20 tablet 0   Ibuprofen (ADVIL PO) Take 200 mg by mouth daily.     losartan  (COZAAR ) 50 MG tablet Take 1 tablet (50 mg total) by mouth daily. 90 tablet 1   morphine  (ROXANOL) 20 MG/ML concentrated solution Take 0.5 mLs (10 mg total) by mouth every 4 (four) hours as needed for breakthrough pain. 30 mL 0   Multiple Vitamins-Minerals (ONE-A-DAY WOMENS VITACRAVES PO) Take by mouth daily.     ondansetron  (ZOFRAN ) 8 MG tablet Take 1 tablet (8 mg total) by mouth every 8 (eight) hours as needed for nausea or vomiting. 30 tablet 3   oxyCODONE  (OXY IR/ROXICODONE ) 5 MG immediate release tablet Take 1 tablet (5 mg total) by mouth  every 6 (six) hours as needed for severe pain (pain score 7-10). 60 tablet 0   pantoprazole  (PROTONIX ) 40 MG tablet Take 1 tablet (40 mg total) by mouth 2 (two) times daily. 60 tablet 1   polyethylene glycol (MIRALAX / GLYCOLAX) 17 g packet Take 17 g by mouth every other day.     Probiotic Product (PROBIOTIC DAILY PO) Take by mouth daily.     prochlorperazine  (COMPAZINE ) 10 MG tablet Take 0.5 tablets (5 mg total) by mouth every 6 (six) hours as needed for refractory nausea / vomiting. 15 tablet 0   rosuvastatin  (CRESTOR ) 20 MG tablet TAKE 1 TABLET BY MOUTH EVERY DAY 90 tablet 1   fentaNYL  (DURAGESIC ) 50 MCG/HR Place 1 patch onto the skin every 3 (three) days. (Patient not taking: Reported on 08/03/2024) 10 patch 0   sucralfate  (CARAFATE ) 1 g tablet Take 1 tablet (1 g total) by mouth in the morning, at noon, and at  bedtime for 14 days. (Patient not taking: Reported on 08/03/2024) 42 tablet 0   Current Facility-Administered Medications  Medication Dose Route Frequency Provider Last Rate Last Admin   denosumab  (PROLIA ) injection 60 mg  60 mg Subcutaneous Once Eubanks, Jessica K, NP         VITALS:   Blood pressure (!) 129/59, pulse 71, temperature 98.1 F (36.7 C), resp. rate 18, height 4' 11 (1.499 m), weight 91 lb (41.3 kg), SpO2 97%.  Wt Readings from Last 3 Encounters:  08/07/24 91 lb (41.3 kg)  08/03/24 92 lb (41.7 kg)  08/03/24 92 lb 9.6 oz (42 kg)    Body mass index is 18.38 kg/m.    Onc Performance Status - 08/07/24 0923       ECOG Perf Status   ECOG Perf Status Restricted in physically strenuous activity but ambulatory and able to carry out work of a light or sedentary nature, e.g., light house work, office work      KPS SCALE   KPS % SCORE Able to carry on normal activity, minor s/s of disease           PHYSICAL EXAM:   Physical Exam Constitutional:      General: She is not in acute distress.    Appearance: Normal appearance.  HENT:     Head: Normocephalic and atraumatic.  Cardiovascular:     Rate and Rhythm: Normal rate.     Heart sounds: Normal heart sounds.  Pulmonary:     Effort: Pulmonary effort is normal. No respiratory distress.     Breath sounds: Normal breath sounds.  Abdominal:     General: There is no distension.  Neurological:     General: No focal deficit present.     Mental Status: She is alert.       LABORATORY DATA:   I have reviewed the data as listed.  Results for orders placed or performed in visit on 08/07/24  CEA (Access)  Result Value Ref Range   CEA ([facility]) 3.82 0.00 - 5.00 ng/mL  CBC with Differential (Cancer Center Only)  Result Value Ref Range   WBC Count 3.4 (L) 4.0 - 10.5 K/uL   RBC 3.39 (L) 3.87 - 5.11 MIL/uL   Hemoglobin 10.4 (L) 12.0 - 15.0 g/dL   HCT 69.2 (L) 63.9 - 53.9 %   MCV 90.6 80.0 - 100.0 fL   MCH 30.7 26.0 -  34.0 pg   MCHC 33.9 30.0 - 36.0 g/dL   RDW 87.4 88.4 - 84.4 %   Platelet  Count 236 150 - 400 K/uL   nRBC 0.0 0.0 - 0.2 %   Neutrophils Relative % 67 %   Neutro Abs 2.3 1.7 - 7.7 K/uL   Lymphocytes Relative 21 %   Lymphs Abs 0.7 0.7 - 4.0 K/uL   Monocytes Relative 9 %   Monocytes Absolute 0.3 0.1 - 1.0 K/uL   Eosinophils Relative 1 %   Eosinophils Absolute 0.0 0.0 - 0.5 K/uL   Basophils Relative 1 %   Basophils Absolute 0.0 0.0 - 0.1 K/uL   Immature Granulocytes 1 %   Abs Immature Granulocytes 0.03 0.00 - 0.07 K/uL  CMP (Cancer Center only)  Result Value Ref Range   Sodium 137 135 - 145 mmol/L   Potassium 4.4 3.5 - 5.1 mmol/L   Chloride 100 98 - 111 mmol/L   CO2 27 22 - 32 mmol/L   Glucose, Bld 81 70 - 99 mg/dL   BUN 18 8 - 23 mg/dL   Creatinine 8.90 (H) 9.55 - 1.00 mg/dL   Calcium  9.9 8.9 - 10.3 mg/dL   Total Protein 6.5 6.5 - 8.1 g/dL   Albumin 4.0 3.5 - 5.0 g/dL   AST 37 15 - 41 U/L   ALT 30 0 - 44 U/L   Alkaline Phosphatase 77 38 - 126 U/L   Total Bilirubin <0.2 0.0 - 1.2 mg/dL   GFR, Estimated 50 (L) >60 mL/min   Anion gap 10 5 - 15  Magnesium   Result Value Ref Range   Magnesium  2.0 1.7 - 2.4 mg/dL    RADIOGRAPHIC STUDIES:  I have personally reviewed the radiological images as listed and agree with the findings in the report.   DG Femur Min 2 Views Right Result Date: 07/30/2024 EXAM: 2 VIEW(S) XRAY OF THE FEMUR 07/30/2024 07:28:00 AM COMPARISON: None available. CLINICAL HISTORY: pain FINDINGS: BONES AND JOINTS: No acute fracture. No malalignment. SOFT TISSUES: The soft tissues are unremarkable. IMPRESSION: 1. No acute findings. Electronically signed by: Waddell Calk MD 07/30/2024 07:38 AM EST RP Workstation: [id number]   MR ABDOMEN MRCP W WO CONTAST Result Date: 07/10/2024 CLINICAL DATA:  History of biliary tract cancer/cholangiocarcinoma, status post chemoradiation. Needs restaging imaging. Pain. EXAM: MRI ABDOMEN WITHOUT AND WITH CONTRAST (INCLUDING MRCP)  TECHNIQUE: Multiplanar multisequence MR imaging of the abdomen was performed both before and after the administration of intravenous contrast. Heavily T2-weighted images of the biliary and pancreatic ducts were obtained, and three-dimensional MRCP images were rendered by post processing. CONTRAST:  4mL GADAVIST  GADOBUTROL  1 MMOL/ML IV SOLN COMPARISON:  CT angiography chest, abdomen and pelvis from 06/29/2024 and MRI abdomen from 12/18/2023. FINDINGS: Lower chest: Unremarkable MR appearance to the lung bases. No pleural effusion. No pericardial effusion. Normal heart size. Hepatobiliary: The liver is normal in size. Noncirrhotic configuration. No focal liver lesion. No intrahepatic bile duct dilation. Redemonstration of stent in the extrahepatic bile duct which exhibit air-fluid level. No discrete obstructing mass or calculus seen. Unremarkable gallbladder. Pancreas: Mild-to-moderate diffuse atrophy of pancreas. No focal lesion seen. Main pancreatic duct is not dilated. There is subtle fat stranding surrounding the pancreatic head, similar to the recent prior study. Findings are nonspecific and may represent sequela of chronic versus acute pancreatitis. Correlate clinically and with serum lipase levels. Spleen:  Within normal limits in size and appearance. No focal mass. Adrenals/Urinary Tract: Unremarkable adrenal glands. No hydroureteronephrosis. No suspicious renal mass. There are scattered, subcentimeter sized simple cortical and sinus cysts in bilateral kidneys. Stomach/Bowel: Visualized portions within the abdomen are unremarkable. No  disproportionate dilation of bowel loops. Vascular/Lymphatic: No pathologically enlarged lymph nodes identified. No abdominal aortic aneurysm demonstrated. No ascites. Other:  None. Musculoskeletal: No suspicious bone lesions identified. IMPRESSION: 1. No discrete obstructing mass or calculus seen in the extrahepatic bile duct. No intrahepatic bile duct dilation. 2. There is  subtle fat stranding surrounding the pancreatic head, similar to the recent prior study. Findings are nonspecific and may represent sequela of chronic versus acute pancreatitis. Correlate clinically and with serum lipase levels. 3. Multiple other nonacute observations, as described above. Electronically Signed   By: Ree Molt M.D.   On: 07/10/2024 10:40   MR 3D Recon At Scanner Result Date: 07/10/2024 CLINICAL DATA:  History of biliary tract cancer/cholangiocarcinoma, status post chemoradiation. Needs restaging imaging. Pain. EXAM: MRI ABDOMEN WITHOUT AND WITH CONTRAST (INCLUDING MRCP) TECHNIQUE: Multiplanar multisequence MR imaging of the abdomen was performed both before and after the administration of intravenous contrast. Heavily T2-weighted images of the biliary and pancreatic ducts were obtained, and three-dimensional MRCP images were rendered by post processing. CONTRAST:  4mL GADAVIST  GADOBUTROL  1 MMOL/ML IV SOLN COMPARISON:  CT angiography chest, abdomen and pelvis from 06/29/2024 and MRI abdomen from 12/18/2023. FINDINGS: Lower chest: Unremarkable MR appearance to the lung bases. No pleural effusion. No pericardial effusion. Normal heart size. Hepatobiliary: The liver is normal in size. Noncirrhotic configuration. No focal liver lesion. No intrahepatic bile duct dilation. Redemonstration of stent in the extrahepatic bile duct which exhibit air-fluid level. No discrete obstructing mass or calculus seen. Unremarkable gallbladder. Pancreas: Mild-to-moderate diffuse atrophy of pancreas. No focal lesion seen. Main pancreatic duct is not dilated. There is subtle fat stranding surrounding the pancreatic head, similar to the recent prior study. Findings are nonspecific and may represent sequela of chronic versus acute pancreatitis. Correlate clinically and with serum lipase levels. Spleen:  Within normal limits in size and appearance. No focal mass. Adrenals/Urinary Tract: Unremarkable adrenal glands. No  hydroureteronephrosis. No suspicious renal mass. There are scattered, subcentimeter sized simple cortical and sinus cysts in bilateral kidneys. Stomach/Bowel: Visualized portions within the abdomen are unremarkable. No disproportionate dilation of bowel loops. Vascular/Lymphatic: No pathologically enlarged lymph nodes identified. No abdominal aortic aneurysm demonstrated. No ascites. Other:  None. Musculoskeletal: No suspicious bone lesions identified. IMPRESSION: 1. No discrete obstructing mass or calculus seen in the extrahepatic bile duct. No intrahepatic bile duct dilation. 2. There is subtle fat stranding surrounding the pancreatic head, similar to the recent prior study. Findings are nonspecific and may represent sequela of chronic versus acute pancreatitis. Correlate clinically and with serum lipase levels. 3. Multiple other nonacute observations, as described above. Electronically Signed   By: Ree Molt M.D.   On: 07/10/2024 10:40      CODE STATUS:  Code Status History     Date Active Date Inactive Code Status Order ID Comments User Context   12/18/2023 1558 12/23/2023 2029 Full Code [id number]  Caroline Basket, MD ED   09/12/2023 2330 09/13/2023 1728 Limited: Do not attempt resuscitation (DNR) -DNR-LIMITED -Do Not Intubate/DNI  [id number]  Lou Claretta HERO, MD ED   11/24/2013 1103 11/26/2013 2046 Full Code [id number]  Katrinka Garnette KIDD, MD Inpatient    Questions for Most Recent Historical Code Status (Order [id number])     Question Answer   By: Consent: discussion documented in EHR                Advance Directive Documentation    Flowsheet Row Most Recent Value  Type of Advance Directive Healthcare Power of [place],  Living will  Pre-existing out of facility DNR order (yellow form or pink MOST form) --  MOST Form in Place? --    Orders Placed This Encounter  Procedures   CBC with Differential (Cancer Center Only)    Standing Status:   Future    Expiration Date:    08/07/2025   CMP (Cancer Center only)    Standing Status:   Future    Expiration Date:   08/07/2025   Cancer antigen 19-9    Standing Status:   Future    Expiration Date:   08/07/2025   CEA (Access)    Standing Status:   Future    Expiration Date:   08/07/2025     Future Appointments  Date Time Provider Department Center  08/08/2024  9:30 AM Valdemar Rocky SAUNDERS, NP [facility] None  08/21/2024  9:00 AM [facility] R/F 1 [facility] [place]  10/08/2024  9:30 AM [facility] PHLEBOTOMIST [facility] None  10/08/2024  9:45 AM Daine Gunther, [place], MD [facility] None  12/07/2024 10:20 AM [name], NP [facility] [id number]     This document was completed utilizing speech recognition software. Grammatical errors, random word insertions, pronoun errors, and incomplete sentences are an occasional consequence of this system due to software limitations, ambient noise, and hardware issues. Any formal questions or concerns about the content, text or information contained within the body of this dictation should be directly addressed to the provider for clarification.   "

## 2024-08-07 NOTE — Assessment & Plan Note (Addendum)
 Please review oncology history for additional details and timeline of events.    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  IHC stain for H. pylori was positive.   On 12/22/2023 CT chest with contrast showed no evidence of intrathoracic metastatic disease.  Clinically it appeared to be cT2-cT3, cN0, cM0 disease.  The cancer wass localized to the common bile duct with no evidence of metastasis in the chest or abdomen.   She did have consultation with Dr. Dasie.  Dr. Dasie did offer surgery as an option but because of the local extent of the disease, she will need to undergo Whipple surgery.  Patient and family members would like for her to avoid surgery as they are worried about her postoperative recovery from Whipple surgery.  They opted to proceed with concurrent chemoradiation instead. Treatment aims to control disease, not cure.   She began concurrent chemoradiation with capecitabine  from 02/13/2024.  Capecitabine  was given at a dose of 800 mg/m twice daily on Monday to Friday during the course of radiation.  This translated to 1000 mg p.o. twice daily dosing for her. She completed treatments on 03/22/2024.  She underwent biliary stent exchange on 03/19/2024.  The stent is functioning well, with normal bilirubin and liver function tests.   She had to be directed to the ED for persistent abdominal pain and a CT scan on 04/11/2024 showed patent biliary stent and no evidence of disease progression.  It showed treatment-related changes.  She was taken to the ED by her family members on 06/29/2024 for nausea, vomiting and right sided chest pain.  CT chest, abdomen pelvis with angiogram was obtained which showed no evidence of acute process.  It also showed no evidence of residual disease.  On 07/10/2024, restaging MRI  of the abdomen showed no discrete obstructing mass or calculus seen in the extrahepatic bile duct. No intrahepatic bile duct dilation. There is subtle fat stranding surrounding the pancreatic head, similar to the recent prior study. Findings are nonspecific and may represent sequela of chronic versus acute pancreatitis.  Overall no evidence of disease.  Will continue surveillance.  No current concerns related to the stent or tumor progression based on recent imaging.  Clinically she is looking a lot better currently.  Most of her pain issues seem to be from arthritis, scoliosis and possible sciatica.  CEA is normal at 3 recently.  CA 19-9 was better at 55, compared to peak of 180.  LFTs are all normal.  CBCD unremarkable.  RTC in 6 weeks for follow-up with repeat labs.

## 2024-08-08 ENCOUNTER — Inpatient Hospital Stay: Admitting: Family

## 2024-08-08 ENCOUNTER — Encounter: Payer: Self-pay | Admitting: Family

## 2024-08-08 ENCOUNTER — Ambulatory Visit: Admitting: Family

## 2024-08-08 DIAGNOSIS — M5416 Radiculopathy, lumbar region: Secondary | ICD-10-CM | POA: Diagnosis not present

## 2024-08-08 LAB — CANCER ANTIGEN 19-9: CA 19-9: 53 U/mL — ABNORMAL HIGH (ref 0–35)

## 2024-08-08 MED ORDER — METHOCARBAMOL 500 MG PO TABS: 500.0000 mg | ORAL_TABLET | Freq: Three times a day (TID) | ORAL | 0 refills | Status: AC | PRN

## 2024-08-08 MED ORDER — PREDNISONE 10 MG PO TABS: 40.0000 mg | ORAL_TABLET | Freq: Every day | ORAL | 0 refills | Status: AC

## 2024-08-08 NOTE — Progress Notes (Signed)
 "  Office Visit Note   Patient: Caroline Snyder           Date of Birth: 02-01-1941           MRN: [id number] Visit Date: 08/08/2024              Requested by: [name], NP [address] [place]. [place],  [place] [address] PCP: [name], NP  Chief Complaint  Patient presents with   Right Leg - Pain      HPI: The patient is an 84 year old woman with a history of pancreatic cancer, recent chemo and radiation, as well as dementia who presents today complaining of right leg and thigh pain.  Her son accompanies the visit and assists with her the history  Severe pain primarily in the right side of her low back which radiates to the hip and anterior thigh she is rubbing it as she sits in the chair this has been gradually worsening since prior to Thanksgiving constant severe pain which impacts her sleep she has been quite debilitated due to the pain  Seems to get some comfort from leaning forward  Due to some of her cancer related pain she has recently been on oxycodone  for about a 3-week course during that time she may have had less pain but was quite drowsy and unable to interact for her ADLs as well as had significantly less p.o. intake per family have attempted to get her off her narcotics  Lumbar CT from September of this year showed lumbar spondylosis and degenerative disc disease with impingement at L5 3 to L4 as well as L4-L5  Assessment & Plan: Visit Diagnoses: No diagnosis found.  Plan: Will trial her on a prednisone  burst.  Patient and family in agreement with the plan will go ahead and refer to Dr. Eldonna for [facility] if prednisone  ineffective she has had a recent lumbar CT  Follow-Up Instructions: No follow-ups on file.   Back Exam   Tenderness  The patient is experiencing no tenderness.   Muscle Strength  Right Quadriceps:  3/5  Left Quadriceps:  3/5  Right Hamstrings:  3/5  Left Hamstrings:  3/5   Tests  Straight leg raise right: positive Straight leg  raise left: negative      Patient is alert, oriented, no adenopathy, well-dressed, normal affect, normal respiratory effort.     Imaging: No results found. No images are attached to the encounter.  Labs: Lab Results  Component Value Date   REPTSTATUS 01/03/2024 FINAL 12/29/2023   REPTSTATUS 01/03/2024 FINAL 12/29/2023   GRAMSTAIN Moderate 11/22/2013   GRAMSTAIN WBC present-both PMN and Mononuclear 11/22/2013   GRAMSTAIN Moderate Squamous Epithelial Cells Present 11/22/2013   GRAMSTAIN  11/22/2013    Abundant Gram Positive Cocci In Pairs In Chains In Clusters   GRAMSTAIN Rare Gram Negative Rods 11/22/2013   CULT  12/29/2023    NO GROWTH 5 DAYS Performed at [facility], [address] [place], [place] [address]    CULT  12/29/2023    NO GROWTH 5 DAYS Performed at [facility], [address] [place], [place] [address]    LABORGA Normal Oropharyngeal Flora 11/22/2013     Lab Results  Component Value Date   ALBUMIN 4.0 08/07/2024   ALBUMIN 4.2 07/31/2024   ALBUMIN 4.1 07/17/2024    Lab Results  Component Value Date   MG 2.0 08/07/2024   MG 1.9 05/18/2024   MG 1.8 04/11/2024   Lab  Results  Component Value Date   VD25OH 51.78 09/13/2023   VD25OH 50 12/23/2016    No results found for: PREALBUMIN    Latest Ref Rng & Units 08/07/2024    8:52 AM 07/31/2024    4:50 PM 07/20/2024    9:42 AM  CBC EXTENDED  WBC 4.0 - 10.5 K/uL 3.4  5.3    RBC 3.87 - 5.11 MIL/uL 3.39  3.83    Hemoglobin 12.0 - 15.0 g/dL 89.5  88.1  88.6   HCT 36.0 - 46.0 % 30.7  34.9  32.9   Platelets 150 - 400 K/uL 236  296    NEUT# 1.7 - 7.7 K/uL 2.3  4.2    Lymph# 0.7 - 4.0 K/uL 0.7  0.7       There is no height or weight on file to calculate BMI.  Orders:  No orders of the defined types were placed in this encounter.  No orders of the defined types were placed in this encounter.    Procedures: No procedures performed  Clinical Data: No additional  findings.  ROS:  All other systems negative, except as noted in the HPI. Review of Systems  Objective: Vital Signs: There were no vitals taken for this visit.  Specialty Comments:  No specialty comments available.  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 No CT evidence of pulmonary arterial embolic disease.   2. Enlarged lymph nodes in the right hilar and subcarinal region.   These may represent reactive lymph nodes, clinical correlation   recommended. There is otherwise no evidence of mediastinal or   parenchymal masses or nodules nor infiltrates.   3. Atelectasis versus  scarring within the lung bases as well as   interst   Acute encephalopathy 09/13/2023   Adenocarcinoma determined by biopsy of bile duct (HCC) 2025   Allergy    Altered mental status 09/13/2023   Benign neoplasm of colon 02/11/2012   Bunion 07/01/2010   Closed nondisplaced fracture of distal phalanx of right thumb 07/25/2020   Death of child Sep 03, 2016   Adult son truck driver died 87/86/82 in accident.     Depression 05/05/2007   External hemorrhoids without mention of complication 03/24/1999   GERD (gastroesophageal  reflux disease) 08/02/1998   Hyperlipidemia 06/29/2006   Hypertensive emergency 09/13/2023   Memory loss 04/15/2003   Migraine without aura, without mention of intractable migraine without mention of status migrainosus 03/24/1999   Osteoporosis, unspecified 04/15/2004   Tear film insufficiency, unspecified 03/24/1999   Unspecified essential hypertension 11/22/2007    Family History  Problem Relation Age of Onset   Alzheimer's disease Mother    Diabetes Mother    Transient ischemic attack Mother    Heart disease Mother        MI, CHF   Alcohol  abuse Father    Kidney disease Father    Hypertension Sister    Hypertension Sister    Hypertension Brother    Heart disease Brother    Hypertension Brother    Hypertension Brother    Other Son        truck accident 07/2016   Colon cancer Neg Hx    Stomach cancer Neg Hx    Esophageal cancer Neg Hx    Pancreatic cancer Neg Hx     Past Surgical History:  Procedure Laterality Date   COLONOSCOPY  06-16-2007   Internal hemorrhoids,laxity of anal sphincter,polyp at 25cm form anal verge, tortuous sigmoid colon. Dr.Orr    ERCP N/A 12/21/2023   Procedure: ERCP, WITH INTERVENTION IF INDICATED;  Surgeon: Wilhelmenia Aloha Raddle., MD;  Location: [facility] ENDOSCOPY;  Service: Gastroenterology;  Laterality: N/A;   ERCP N/A 03/19/2024   Procedure: ERCP, WITH INTERVENTION IF INDICATED;  Surgeon: Wilhelmenia Aloha Raddle., MD;  Location: [facility]  ENDOSCOPY;  Service: Gastroenterology;  Laterality: N/A;   ESOPHAGOGASTRODUODENOSCOPY N/A 12/21/2023   Procedure: EGD (ESOPHAGOGASTRODUODENOSCOPY);  Surgeon: Wilhelmenia Aloha Raddle., MD;  Location: [facility] ENDOSCOPY;  Service: Gastroenterology;  Laterality: N/A;   EUS N/A 12/21/2023   Procedure: ULTRASOUND, UPPER GI TRACT, ENDOSCOPIC;  Surgeon: Wilhelmenia Aloha Raddle., MD;  Location: [facility] ENDOSCOPY;  Service: Gastroenterology;  Laterality: N/A;   EYE SURGERY Bilateral 2010   cataract Dr. Camillo   FINE NEEDLE ASPIRATION  12/21/2023   Procedure: FINE NEEDLE ASPIRATION;  Surgeon: Wilhelmenia Aloha Raddle., MD;  Location: [facility] ENDOSCOPY;  Service: Gastroenterology;;   VAGINAL HYSTERECTOMY  1980   Social History   Occupational History   Occupation: Retired  Tobacco Use   Smoking status: Never   Smokeless tobacco: Never  Vaping Use   Vaping status: Never Used  Substance and Sexual Activity   Alcohol  use: No    Alcohol /week: 0.0 standard drinks of alcohol    Drug use: No   Sexual activity: Not Currently    Comment: 1st intercourse- 16, partners- 2,        "

## 2024-08-11 ENCOUNTER — Other Ambulatory Visit: Payer: Self-pay | Admitting: Nurse Practitioner

## 2024-08-14 ENCOUNTER — Ambulatory Visit: Admitting: Adult Health

## 2024-08-14 ENCOUNTER — Encounter: Payer: Self-pay | Admitting: Adult Health

## 2024-08-14 VITALS — BP 122/78 | HR 72 | Temp 96.9°F | Ht 59.0 in | Wt 90.4 lb

## 2024-08-14 DIAGNOSIS — M5416 Radiculopathy, lumbar region: Secondary | ICD-10-CM

## 2024-08-14 DIAGNOSIS — K5901 Slow transit constipation: Secondary | ICD-10-CM | POA: Diagnosis not present

## 2024-08-14 DIAGNOSIS — I10 Essential (primary) hypertension: Secondary | ICD-10-CM

## 2024-08-14 DIAGNOSIS — F0150 Vascular dementia without behavioral disturbance: Secondary | ICD-10-CM | POA: Diagnosis not present

## 2024-08-14 DIAGNOSIS — C240 Malignant neoplasm of extrahepatic bile duct: Secondary | ICD-10-CM | POA: Diagnosis not present

## 2024-08-14 DIAGNOSIS — K2100 Gastro-esophageal reflux disease with esophagitis, without bleeding: Secondary | ICD-10-CM | POA: Diagnosis not present

## 2024-08-14 NOTE — Progress Notes (Signed)
 "  [facility]  Provider:  Jereld Serum DNP  Code Status:  DNR  Goals of Care:     08/07/2024    9:18 AM  Advanced Directives  Does Patient Have a Medical Advance Directive? No  Does patient want to make changes to medical advance directive? No - Patient declined  Would patient like information on creating a medical advance directive? No - Patient declined     Chief Complaint  Patient presents with   Follow-up    Last visit on 12/30 for right sided pain.    Medication Problem    Losartan  currently on 50 mg wanted to know if it needed to be adjusted to 100. They have been taking the 100mg  for a couple of weeks.    Referral    Neurologist to discuss dementia    Discussed the use of AI scribe software for clinical note transcription with the patient, who gave verbal consent to proceed.  HPI: Patient is a 84 y.o. female seen today for a follow up of chronic medical issues. She is accompanied by her son.  She is seeking clarification on her blood pressure medication regimen. She has a history of hypertension and is currently taking losartan  50 mg daily. The dose was previously reduced from 100 mg during her cancer treatment due to decreased food intake. She uses hydralazine  10 mg as needed for systolic blood pressure over 160, which was last used three weeks ago.  She has a history of cholangiocarcinoma of the extrahepatic bile duct and has undergone radiation and chemotherapy. A metal stent was placed in her bile duct. She is no longer on narcotics and manages pain with Tylenol . She experiences constipation, managed with Miralax, and has acid reflux for which she takes Protonix  40 mg twice daily. She is scheduled for an upper GI on January 20th to investigate ongoing issues.  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 Her son is seeking a referral to a neurologist for further management.  She has lumbar radiculopathy,  primarily affecting her right side, for which she was prescribed prednisone  40 mg daily for five days. She was also prescribed Robaxin  500 mg as needed, but it was not used.  She takes rosuvastatin  20 mg daily for hyperlipidemia and does not use sucralfate  anymore.    Past Medical History:  Diagnosis Date   Abnormal CT of the chest 11/24/2013   See CT chest 11/22/13  1. No CT evidence of pulmonary arterial embolic disease.   2. Enlarged lymph nodes in the right hilar and subcarinal region.   These may represent reactive lymph nodes, clinical correlation   recommended. There is otherwise no evidence of mediastinal or   parenchymal masses or nodules nor infiltrates.   3. 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     Depression 05/05/2007   External hemorrhoids without mention of complication 03/24/1999   GERD (gastroesophageal reflux disease) 08/02/1998   Hyperlipidemia 06/29/2006   Hypertensive emergency 09/13/2023   Memory loss 04/15/2003   Migraine without  aura, without mention of intractable migraine without mention of status migrainosus 03/24/1999   Osteoporosis, unspecified 04/15/2004   Tear film insufficiency, 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 Dr.Orr    ERCP N/A 12/21/2023   Procedure: ERCP, WITH INTERVENTION IF INDICATED;  Surgeon: Wilhelmenia Aloha Raddle., MD;  Location: [facility] ENDOSCOPY;   Service: Gastroenterology;  Laterality: N/A;   ERCP N/A 03/19/2024   Procedure: ERCP, WITH INTERVENTION IF INDICATED;  Surgeon: Wilhelmenia Aloha Raddle., MD;  Location: [facility] ENDOSCOPY;  Service: Gastroenterology;  Laterality: N/A;   ESOPHAGOGASTRODUODENOSCOPY N/A 12/21/2023   Procedure: EGD (ESOPHAGOGASTRODUODENOSCOPY);  Surgeon: Wilhelmenia Aloha Raddle., MD;  Location: [facility] ENDOSCOPY;  Service: Gastroenterology;  Laterality: N/A;   EUS N/A 12/21/2023   Procedure: ULTRASOUND, UPPER GI TRACT, ENDOSCOPIC;  Surgeon: Wilhelmenia Aloha Raddle., MD;  Location: [facility] ENDOSCOPY;  Service: Gastroenterology;  Laterality: N/A;   EYE SURGERY Bilateral 2010   cataract Dr. Camillo   FINE NEEDLE ASPIRATION  12/21/2023   Procedure: FINE NEEDLE ASPIRATION;  Surgeon: Wilhelmenia Aloha Raddle., MD;  Location: [facility] ENDOSCOPY;  Service: Gastroenterology;;   VAGINAL HYSTERECTOMY  1980    Allergies[1]  Outpatient Encounter Medications as of 08/14/2024  Medication Sig   acetaminophen  (TYLENOL ) 500 MG tablet Take 500-1,000 mg by mouth every 8 (eight) hours as needed (for pain).   aspirin  EC 81 MG tablet Take 1 tablet (81 mg total) by mouth daily. Swallow whole.   Ensure (ENSURE) Take 237 mLs by mouth 2 (two) times daily between meals.   famotidine  (PEPCID ) 40 MG tablet Take 1 tablet (40 mg total) by mouth daily for 14 days.   hydrALAZINE  (APRESOLINE ) 10 MG tablet Take 1 tablet (10 mg total) by mouth 2 (two) times daily as needed. For SBP >160   Ibuprofen (ADVIL PO) Take 200 mg by mouth daily.   losartan  (COZAAR ) 50 MG tablet Take 1 tablet (50 mg total) by mouth daily.   morphine  (ROXANOL) 20 MG/ML concentrated solution Take 0.5 mLs (10 mg total) by mouth every 4 (four) hours as needed for breakthrough pain.   Multiple Vitamins-Minerals (ONE-A-DAY WOMENS VITACRAVES PO) Take by mouth daily.   ondansetron  (ZOFRAN ) 8 MG tablet TAKE 1 TABLET BY MOUTH EVERY 8 HOURS AS NEEDED FOR NAUSEA OR VOMITING.   pantoprazole  (PROTONIX ) 40 MG tablet  TAKE 1 TABLET BY MOUTH TWICE A DAY   polyethylene glycol (MIRALAX / GLYCOLAX) 17 g packet Take 17 g by mouth every other day.   Probiotic Product (PROBIOTIC DAILY PO) Take by mouth daily.   rosuvastatin  (CRESTOR ) 20 MG tablet TAKE 1 TABLET BY MOUTH EVERY DAY   fentaNYL  (DURAGESIC ) 50 MCG/HR Place 1 patch onto the skin every 3 (three) days. (Patient not taking: Reported on 08/03/2024)   methocarbamol  (ROBAXIN ) 500 MG tablet Take 1 tablet (500 mg total) by mouth every 8 (eight) hours as needed for muscle spasms. (Patient not taking: Reported on 08/14/2024)   oxyCODONE  (OXY IR/ROXICODONE ) 5 MG immediate release tablet Take 1 tablet (5 mg total) by mouth every 6 (six) hours as needed for severe pain (pain score 7-10). (Patient not taking: Reported on 08/14/2024)   predniSONE  (DELTASONE ) 10 MG tablet Take 4 tablets (40 mg total) by mouth daily with breakfast. (Patient not taking: Reported on 08/14/2024)   prochlorperazine  (COMPAZINE ) 10 MG tablet Take 0.5 tablets (5 mg total) by mouth  every 6 (six) hours as needed for refractory nausea / vomiting. (Patient not taking: Reported on 08/14/2024)   sucralfate  (CARAFATE ) 1 g tablet Take 1 tablet (1 g total) by mouth in the morning, at noon, and at bedtime for 14 days. (Patient not taking: Reported on 08/03/2024)   Facility-Administered Encounter Medications as of 08/14/2024  Medication   denosumab  (PROLIA ) injection 60 mg    Review of Systems:  Review of Systems  Constitutional:  Positive for appetite change. Negative for chills, fatigue and fever.  HENT:  Negative for congestion, hearing loss, rhinorrhea and sore throat.   Eyes: Negative.   Respiratory:  Negative for cough, shortness of breath and wheezing.   Cardiovascular:  Negative for chest pain, palpitations and leg swelling.  Gastrointestinal:  Negative for abdominal pain, constipation, diarrhea, nausea and vomiting.  Genitourinary:  Negative for dysuria.  Musculoskeletal:  Negative for arthralgias, back  pain and myalgias.  Skin:  Negative for color change, rash and wound.  Neurological:  Negative for dizziness, weakness and headaches.  Psychiatric/Behavioral:  Negative for behavioral problems. The patient is not nervous/anxious.     Health Maintenance  Topic Date Due   Medicare Annual Wellness (AWV)  06/30/2023   Influenza Vaccine  09/18/2024 (Originally 03/02/2024)   COVID-19 Vaccine (6 - 2025-26 season) 09/18/2024 (Originally 04/02/2024)   Zoster Vaccines- Shingrix (2 of 2) 09/18/2024 (Originally 11/30/2022)   DTaP/Tdap/Td (3 - Td or Tdap) 11/16/2028   Pneumococcal Vaccine: 50+ Years  Completed   Bone Density Scan  Completed   Meningococcal B Vaccine  Aged Out   Mammogram  Discontinued   Colonoscopy  Discontinued    Physical Exam: Vitals:   08/14/24 0854  BP: 122/78  Pulse: 72  Temp: (!) 96.9 F (36.1 C)  TempSrc: Temporal  SpO2: 100%  Weight: 90 lb 6.4 oz (41 kg)  Height: 4' 11 (1.499 m)   Body mass index is 18.26 kg/m. Physical Exam Constitutional:      Appearance: Normal appearance.  HENT:     Head: Normocephalic and atraumatic.     Nose: Nose normal.     Mouth/Throat:     Mouth: Mucous membranes are moist.  Eyes:     Conjunctiva/sclera: Conjunctivae normal.  Cardiovascular:     Rate and Rhythm: Normal rate and regular rhythm.  Pulmonary:     Effort: Pulmonary effort is normal.     Breath sounds: Normal breath sounds.  Abdominal:     General: Bowel sounds are normal.     Palpations: Abdomen is soft.  Musculoskeletal:        General: Normal range of motion.     Cervical back: Normal range of motion.  Skin:    General: Skin is warm and dry.  Neurological:     Mental Status: She is alert.  Psychiatric:        Mood and Affect: Mood normal.        Behavior: Behavior normal.        Thought Content: Thought content normal.        Judgment: Judgment normal.     Labs reviewed: Basic Metabolic Panel: Recent Labs    09/13/23 0340 12/13/23 1042  03/26/24 0711 03/27/24 0556 03/28/24 0725 04/11/24 0901 05/18/24 1448 06/26/24 1522 07/17/24 1830 07/17/24 1900 07/31/24 1650 08/07/24 0852  NA  --    < > 137 139 139 137 141   < > 136 136 136 137  K  --    < > 4.0 3.5 4.0 4.1  3.9   < > 4.5 4.8 3.9 4.4  CL  --    < > 109 107 107 103 105   < > 100 100 101 100  CO2  --    < > 23 23 21* 26 29   < > 27  --  24 27  GLUCOSE  --    < > 117* 116* 109* 157* 142*   < > 113* 106* 152* 81  BUN  --    < > 11 12 10 15 16    < > 20 25* 16 18  CREATININE  --    < > 0.66 0.63 0.61 0.91 0.77   < > 0.80 0.90 0.93 1.09*  CALCIUM   --    < > 8.4* 8.4* 8.6* 9.5 10.0   < > 9.7  --  10.0 9.9  MG 2.0   < > 1.9 1.7 1.7 1.8 1.9  --   --   --   --  2.0  PHOS 2.7   < > 2.2* 3.5 2.7  --   --   --   --   --   --   --   TSH 5.030*  --   --   --   --   --   --   --   --   --   --   --    < > = values in this interval not displayed.   Liver Function Tests: Recent Labs    07/17/24 1830 07/31/24 1650 08/07/24 0852  AST 34 34 37  ALT 25 28 30   ALKPHOS 80 79 77  BILITOT <0.2 0.2 <0.2  PROT 6.8 6.9 6.5  ALBUMIN 4.1 4.2 4.0   Recent Labs    12/13/23 1042 12/18/23 1255 04/11/24 1226 07/17/24 1830 07/31/24 1650  LIPASE 38   < > 12 33 12  AMYLASE 93  --   --   --   --    < > = values in this interval not displayed.   No results for input(s): AMMONIA in the last 8760 hours. CBC: Recent Labs    07/17/24 1830 07/17/24 1900 07/20/24 0942 07/31/24 1650 08/07/24 0852  WBC 5.3  --   --  5.3 3.4*  NEUTROABS 4.0  --   --  4.2 2.3  HGB 11.5*   < > 11.3* 11.8* 10.4*  HCT 34.9*   < > 32.9* 34.9* 30.7*  MCV 93.6  --   --  91.1 90.6  PLT 250  --   --  296 236   < > = values in this interval not displayed.   Lipid Panel: No results for input(s): CHOL, HDL, LDLCALC, TRIG, CHOLHDL, LDLDIRECT in the last 8760 hours. No results found for: HGBA1C  Procedures since last visit: DG Femur Min 2 Views Right Result Date: 07/30/2024 EXAM: 2 VIEW(S)  XRAY OF THE FEMUR 07/30/2024 07:28:00 AM COMPARISON: None available. CLINICAL HISTORY: pain FINDINGS: BONES AND JOINTS: No acute fracture. No malalignment. SOFT TISSUES: The soft tissues are unremarkable. IMPRESSION: 1. No acute findings. Electronically signed by: Waddell Calk MD 07/30/2024 07:38 AM EST RP Workstation: [id number]    Assessment/Plan  1. Essential hypertension (Primary) -  Blood pressure controlled at 122/78 mmHg on losartan  50 mg daily. Stable after reduction from 100 mg during cancer treatments. - Continue losartan  50 mg daily. - Use hydralazine  10 mg PO BID as needed for systolic blood pressure >160 mmHg. - Bring blood pressure log to next appointment.  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 Metal stent in bile duct. Cancer markers controlled. - Continue oncology follow-up.  3. Vascular dementia without behavioral disturbance (HCC) -  Diagnosed without behavioral symptoms. Family desires treatment initiation. - Referred to neurology for evaluation and management of vascular dementia. - Ambulatory referral to Neurology  4. Lumbar radiculopathy -  Right-sided lumbar radiculopathy with radiating pain. Currently treated with prednisone  40 mg daily X 5 days. No current Robaxin  use. - Continue follow-up with orthopedics.  5. Slow transit constipation -  Previously managed with Miralax. Regular bowel movements. No narcotics use. - Continue dietary modifications including increased water, vegetables, and fruits.  6. Gastroesophageal reflux disease with esophagitis without hemorrhage -  managed with Protonix . Upper GI evaluation scheduled. - Continue Protonix  40 mg twice daily. - Proceed with scheduled upper GI evaluation.    Completed FMLA of son and DNR form today.  Labs/tests ordered:  None  Next appt:  12/07/2024      [1]  Allergies Allergen Reactions   Aricept  [Donepezil ]  Diarrhea   Vioxx [Rofecoxib] Nausea Only    Pt. Can't remember what reaction was but asks to leave on her profile.    "

## 2024-08-15 ENCOUNTER — Encounter: Payer: Self-pay | Admitting: Physical Medicine and Rehabilitation

## 2024-08-15 ENCOUNTER — Ambulatory Visit: Admitting: Physical Medicine and Rehabilitation

## 2024-08-15 VITALS — BP 170/80 | HR 62

## 2024-08-15 DIAGNOSIS — M7918 Myalgia, other site: Secondary | ICD-10-CM | POA: Diagnosis not present

## 2024-08-15 DIAGNOSIS — G8929 Other chronic pain: Secondary | ICD-10-CM

## 2024-08-15 DIAGNOSIS — M546 Pain in thoracic spine: Secondary | ICD-10-CM | POA: Diagnosis not present

## 2024-08-15 MED ORDER — DULOXETINE HCL 30 MG PO CPEP: 30.0000 mg | ORAL_CAPSULE | Freq: Every day | ORAL | 0 refills | Status: AC

## 2024-08-15 NOTE — Progress Notes (Signed)
 Pain Scale   Average Pain 5  Pain right middle back and under right breast.

## 2024-08-15 NOTE — Progress Notes (Signed)
 "  Caroline Snyder - 84 y.o. female MRN [id number]  Date of birth: [birth year]  Office Visit Note: Visit Date: 08/15/2024 PCP: Caroline Harlene POUR, NP Referred by: Caroline Harlene POUR, NP  Subjective: Chief Complaint  Patient presents with   Middle Back - Pain   HPI: Caroline Snyder is a 84 y.o. female who comes in today per the request of Rocky Hans, NP for evaluation of chronic, worsening and severe right sided middle back radiating around to right breast region. Son is accompanying her during our visit today. 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 Also reports episode of lower back pain radiating down right leg. She has also undergone chemotherapy and radiation. Her pain is constant, no specific aggravating factors. Some relief of pain with home exercise regimen, rest and use of medications. She was previously prescribed Oxycodone  by oncology, however her son felt these medications were not beneficial for her, he helped her to wean off of narcotic medications. Thoracic MRI imaging from August of 2025 shows no compressive stenosis of the canal or foramina. 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 No history of spine surgery/injections. Patient denies focal weakness. No recent trauma or falls.           Review of Systems  Musculoskeletal:  Positive for back pain and myalgias.  Neurological:  Negative for tingling, sensory change, focal weakness and weakness.  All other systems reviewed and are negative.  Otherwise per HPI.  Assessment & Plan: Visit Diagnoses:    ICD-10-CM   1. Chronic right-sided thoracic back pain  M54.6 Ambulatory referral to Physical Therapy   G89.29     2. Myofascial pain syndrome  M79.18 Ambulatory referral to Physical Therapy        Plan: Findings:  Chronic, worsening and severe right sided thoracic back pain radiating around to right breast region. Patient continues to have severe pain despite good conservative therapies such as home exercise regimen, rest and use of medications. Recent thoracic MRI imaging shows no significant nerve compression. No central canal stenosis. Her pain is most consistent with myofascial pain syndrome. She is very tender upon palpation of right thoracic back and right breast region upon light palpation today. We discussed treatment plan in detail today. Next step is to place order for short course of gentle physical therapy. I do think she would benefit from manual treatments. I also discussed medication management and started her on low dose Cymbalta . I explained to her son that this medication can also be used to treat more myofascial pain. I would like to see her back in approximately 4 weeks for re-evaluation. Should her pain persist would recommend referral to more comprehensive pain management practice such as Dr. Toribio Badder with [facility]. She may be a candidate for intercostal blocks or peripheral nerve stimulation. Her exam today is non focal, good strength to bilateral upper extremities. She is visibly uncomfortable, encouraged son to try Lidocaine  patches and Voltaren gel.         Meds & Orders:  Meds ordered this encounter  Medications   DULoxetine  (CYMBALTA ) 30 MG capsule    Sig: Take 1 capsule (30 mg total) by mouth daily.    Dispense:  60 capsule    Refill:  0    Orders Placed This Encounter  Procedures  Ambulatory referral to Physical Therapy    Follow-up: Return for 4 week follow up post PT/medication management.   Procedures: No procedures performed      Clinical History: Narrative & Impression CLINICAL DATA:  Metastatic disease evaluation. Bile duct cancer. Surgery yesterday.   EXAM: MRI THORACIC WITHOUT AND WITH CONTRAST   TECHNIQUE: Multiplanar and  multiecho pulse sequences of the thoracic spine were obtained without and with intravenous contrast.   CONTRAST:  5mL GADAVIST  GADOBUTROL  1 MMOL/ML IV SOLN   COMPARISON:  None Available.   FINDINGS: Alignment:  No significant malalignment.  Minimal scoliosis.   Vertebrae: No fracture or focal bone lesion. No evidence of bony metastatic disease.   Cord:  No cord compression or metastatic disease to the cord.   Paraspinal and other soft tissues: Tiny pleural effusions layering dependently.   Disc levels:   No significant disc level finding. No compressive stenosis of the canal or foramina. 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   IMPRESSION: 1. No evidence of metastatic disease to the thoracic spine. 2. Tiny pleural effusions layering dependently.     Electronically Signed   By: Oneil Officer M.D.   On: 03/23/2024 12:49   She reports that she has never smoked. She has never used smokeless tobacco. No results for input(s): HGBA1C, LABURIC in the last 8760 hours.  Objective:  VS:  HT:    WT:   BMI:     BP:(!) 170/80  HR:62bpm  TEMP: ( )  RESP:  Physical Exam Vitals and nursing note reviewed.  HENT:     Head: Normocephalic and atraumatic.     Right Ear: External ear normal.     Left Ear: External ear normal.     Nose: Nose normal.     Mouth/Throat:     Mouth: Mucous membranes are moist.  Eyes:     Extraocular Movements: Extraocular movements intact.  Cardiovascular:     Rate and Rhythm: Normal rate.     Pulses: Normal pulses.  Pulmonary:     Effort: Pulmonary effort is normal.  Abdominal:     General: Abdomen is flat. There is no distension.  Musculoskeletal:        General: Tenderness present.     Cervical back: Normal range of motion.     Comments: Normal thoracic kyphosis noted. No tenderness noted upon palpation of spinous processes. No step off deformity. No pain noted with flexion, extension, lateral  bending and rotation. Significant myofascial tenderness upon light palpation of right thoracic paraspinal region and right breast. Good strength noted to bilateral upper extremities.    Skin:    General: Skin is warm and dry.     Capillary Refill: Capillary refill takes less than 2 seconds.  Neurological:     General: No focal deficit present.     Mental Status: She is alert and oriented to person, place, and time.  Psychiatric:        Mood and Affect: Mood normal.        Behavior: Behavior normal.     Ortho Exam  Imaging: No results found.  Past Medical/Family/Surgical/Social History: Medications & Allergies reviewed per EMR, new medications updated. 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 No CT evidence of pulmonary arterial embolic disease.   2. Enlarged lymph nodes in the right hilar and subcarinal region.   These may represent reactive lymph nodes, clinical correlation   recommended. There is otherwise no evidence of mediastinal or   parenchymal masses or nodules nor infiltrates.   3. Atelectasis versus scarring within the lung bases as well as   interst   Acute encephalopathy 09/13/2023   Adenocarcinoma determined by biopsy of bile duct (HCC) 2025   Allergy    Altered mental status 09/13/2023   Benign neoplasm of colon 02/11/2012   Bunion 07/01/2010   Closed nondisplaced fracture of distal phalanx of right thumb 08-06-2020   Death of child 09/15/16   Adult son truck driver died 87/86/82 in accident.     Depression 05/05/2007   External hemorrhoids without mention of complication 03/24/1999   GERD (gastroesophageal reflux disease) 08/02/1998   Hyperlipidemia 06/29/2006   Hypertensive emergency 09/13/2023   Memory loss 04/15/2003   Migraine without aura, without mention of intractable migraine without mention of status migrainosus 03/24/1999   Osteoporosis, unspecified 04/15/2004   Tear film insufficiency, unspecified 03/24/1999   Unspecified essential hypertension 11/22/2007   Family History  Problem Relation Age of Onset   Alzheimer's disease Mother    Diabetes Mother    Transient ischemic attack Mother    Heart disease Mother        MI, CHF   Alcohol  abuse Father    Kidney disease Father    Hypertension Sister    Hypertension Sister    Hypertension Brother    Heart disease Brother    Hypertension Brother    Hypertension Brother    Other Son        truck accident 08/06/16   Colon cancer Neg Hx    Stomach cancer Neg Hx    Esophageal cancer Neg Hx    Pancreatic cancer Neg Hx    Past Surgical History:  Procedure Laterality Date   COLONOSCOPY   06-16-2007   Internal hemorrhoids,laxity of anal sphincter,polyp at 25cm form anal verge, tortuous sigmoid colon. Dr.Orr    ERCP N/A 12/21/2023   Procedure: ERCP, WITH INTERVENTION IF INDICATED;  Surgeon: Wilhelmenia Aloha Raddle., MD;  Location: [facility] ENDOSCOPY;  Service: Gastroenterology;  Laterality: N/A;   ERCP N/A 03/19/2024   Procedure: ERCP, WITH INTERVENTION IF INDICATED;  Surgeon: Wilhelmenia Aloha Raddle., MD;  Location: [facility] ENDOSCOPY;  Service: Gastroenterology;  Laterality: N/A;   ESOPHAGOGASTRODUODENOSCOPY N/A 12/21/2023   Procedure:  EGD (ESOPHAGOGASTRODUODENOSCOPY);  Surgeon: Wilhelmenia Aloha Raddle., MD;  Location: [facility] ENDOSCOPY;  Service: Gastroenterology;  Laterality: N/A;   EUS N/A 12/21/2023   Procedure: ULTRASOUND, UPPER GI TRACT, ENDOSCOPIC;  Surgeon: Wilhelmenia Aloha Raddle., MD;  Location: [facility] ENDOSCOPY;  Service: Gastroenterology;  Laterality: N/A;   EYE SURGERY Bilateral 2010   cataract Dr. Camillo   FINE NEEDLE ASPIRATION  12/21/2023   Procedure: FINE NEEDLE ASPIRATION;  Surgeon: Wilhelmenia Aloha Raddle., MD;  Location: [facility] ENDOSCOPY;  Service: Gastroenterology;;   VAGINAL HYSTERECTOMY  1980   Social History   Occupational History   Occupation: Retired  Tobacco Use   Smoking status: Never   Smokeless tobacco: Never  Vaping Use   Vaping status: Never Used  Substance and Sexual Activity   Alcohol  use: No    Alcohol /week: 0.0 standard drinks of alcohol    Drug use: No   Sexual activity: Not Currently    Comment: 1st intercourse- 16, partners- 2,    "

## 2024-08-16 ENCOUNTER — Telehealth: Payer: Self-pay

## 2024-08-16 NOTE — Telephone Encounter (Signed)
 Copied from CRM [id number]. Topic: General - Other >> Aug 16, 2024 10:25 AM DeAngela L wrote: Reason for RMF:Ejupzwu son calling to inform the office that line number 7 was not completed on the FMLA forms  This could have been on of the undetermined answers, but his employer says number 7 has to be answered and completed and the form can be reused and just complete #7 and refax  He was at the appointment on Tuesday with Provider Caroline JAYSON Serum, Caroline Snyder  Caroline Snyder patient son  [phone]

## 2024-08-17 NOTE — Telephone Encounter (Signed)
 FMLA completed and handed to front desk staff

## 2024-08-17 NOTE — Telephone Encounter (Signed)
 The FMLA form was re-faxed with corrections made by the provider.

## 2024-08-21 ENCOUNTER — Ambulatory Visit (HOSPITAL_COMMUNITY)

## 2024-08-21 ENCOUNTER — Ambulatory Visit: Admitting: Gastroenterology

## 2024-08-21 ENCOUNTER — Ambulatory Visit: Admitting: Nurse Practitioner

## 2024-08-23 ENCOUNTER — Ambulatory Visit: Attending: Physical Medicine and Rehabilitation

## 2024-08-23 ENCOUNTER — Other Ambulatory Visit: Payer: Self-pay

## 2024-08-23 DIAGNOSIS — M546 Pain in thoracic spine: Secondary | ICD-10-CM | POA: Diagnosis not present

## 2024-08-23 DIAGNOSIS — M545 Low back pain, unspecified: Secondary | ICD-10-CM | POA: Insufficient documentation

## 2024-08-23 DIAGNOSIS — M7918 Myalgia, other site: Secondary | ICD-10-CM | POA: Insufficient documentation

## 2024-08-23 DIAGNOSIS — M5414 Radiculopathy, thoracic region: Secondary | ICD-10-CM | POA: Insufficient documentation

## 2024-08-23 DIAGNOSIS — G8929 Other chronic pain: Secondary | ICD-10-CM | POA: Diagnosis not present

## 2024-08-23 NOTE — Therapy (Signed)
 " OUTPATIENT PHYSICAL THERAPY  EVALUATION   Patient Name: Caroline Snyder MRN: [id number] DOB:[birth year], 84 y.o., female Today's Date: 08/23/2024  END OF SESSION: PT End of Session - 08/23/2024     Visit Number  1   Number of Visits 16   Date for Recertification 10/18/2024     Authorization Type M[employer]     Authorization Time Period VL- medical necessity    PT Start Time 1001    PT Stop Time 1042    PT Time Calculation (min) 41 min    Activity Tolerance Patient tolerated treatment well    Behavior During Therapy [facility] for tasks assessed/performed     Past Medical History:  Diagnosis Date   Abnormal CT of the chest 11/24/2013   See CT chest 11/22/13  1. No CT evidence of pulmonary arterial embolic disease.   2. Enlarged lymph nodes in the right hilar and subcarinal region.   These may represent reactive lymph nodes, clinical correlation   recommended. There is otherwise no evidence of mediastinal or   parenchymal masses or nodules nor infiltrates.   3. 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     Depression 05/05/2007   External hemorrhoids without mention of complication 03/24/1999   GERD (gastroesophageal reflux disease) 08/02/1998   Hyperlipidemia 06/29/2006   Hypertensive emergency 09/13/2023   Memory loss 04/15/2003   Migraine without aura, without mention of intractable migraine without mention of status migrainosus 03/24/1999   Osteoporosis, unspecified 04/15/2004   Tear film insufficiency, unspecified 03/24/1999   Unspecified essential hypertension 11/22/2007   Past Surgical  History:  Procedure Laterality Date   COLONOSCOPY  06-16-2007   Internal hemorrhoids,laxity of anal sphincter,polyp at 25cm form anal verge, tortuous sigmoid colon. Dr.Orr    ERCP N/A 12/21/2023   Procedure: ERCP, WITH INTERVENTION IF INDICATED;  Surgeon: Wilhelmenia Aloha Raddle., MD;  Location: [facility] ENDOSCOPY;  Service: Gastroenterology;  Laterality: N/A;   ERCP N/A 03/19/2024   Procedure: ERCP, WITH INTERVENTION IF INDICATED;  Surgeon: Wilhelmenia Aloha Raddle., MD;  Location: [facility] ENDOSCOPY;  Service: Gastroenterology;  Laterality: N/A;   ESOPHAGOGASTRODUODENOSCOPY N/A 12/21/2023   Procedure: EGD (ESOPHAGOGASTRODUODENOSCOPY);  Surgeon: Wilhelmenia Aloha Raddle., MD;  Location: [facility] ENDOSCOPY;  Service: Gastroenterology;  Laterality: N/A;   EUS N/A 12/21/2023   Procedure: ULTRASOUND, UPPER GI TRACT, ENDOSCOPIC;  Surgeon: Wilhelmenia Aloha Raddle., MD;  Location: [facility] ENDOSCOPY;  Service: Gastroenterology;  Laterality: N/A;   EYE SURGERY Bilateral 2010   cataract Dr. Camillo   FINE NEEDLE ASPIRATION  12/21/2023   Procedure: FINE NEEDLE ASPIRATION;  Surgeon: Wilhelmenia Aloha Raddle., MD;  Location: [facility] ENDOSCOPY;  Service: Gastroenterology;;   VAGINAL HYSTERECTOMY  1980   Patient Active Problem List   Diagnosis Date Noted   Right-sided posterior chest wall pain 04/11/2024   Protein-calorie malnutrition, severe 03/26/2024   Cancer associated pain 03/26/2024   Hypertensive urgency 03/23/2024   Nausea vomiting and diarrhea 03/23/2024   Hypomagnesemia 03/18/2024   Hypophosphatemia 03/18/2024   Hypokalemia 03/18/2024   Vascular dementia (HCC) 03/17/2024   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   Osteoporosis 01/10/2014   Vaginal atrophy 01/10/2014   Cervicalgia 08/21/2013   Depression, recurrent    GERD (gastroesophageal reflux disease)    History of colonic polyps 04/27/2010   Essential hypertension 11/22/2007   Hyperlipidemia 06/29/2006    PCP: [name], NP  REFERRING PROVIDER: Trudy Duwaine BRAVO, NP  REFERRING DIAG:  [id number] (ICD-10-CM) - Chronic right-sided thoracic back pain M79.18 (ICD-10-CM) - Myofascial pain syndrome  THERAPY DIAG:  Radiculopathy, thoracic region  Lumbar pain  Myofascial pain syndrome  Rationale for Evaluation and Treatment: Rehabilitation  ONSET DATE: 8+ months  SUBJECTIVE:                                                                                                                                                                                                         SUBJECTIVE STATEMENT: Patient reports to Pt with her son who helps to provide subjective information. She has been experiencing R sided TS and LS pain after >1 month hospital stay. Son reports that when the pain comes on, she can get nauseous.    PERTINENT HISTORY:  Relevant PMHx includes protein-calorie malnutrition, cancer associated pain, vascular dementia, Cholangiocarcinoma, osteoporosis, depression  PAIN:  Are you having pain? Yes: NPRS scale: 10/10 worst Pain location: R sided thoracic, periscapular pain with tingling under R breast Pain description: tingling Aggravating factors: unable to identify  Relieving factors: prednisone , tylenol   PRECAUTIONS: Other: osteoporosis with hx of malnutrition  RED FLAGS: None     WEIGHT BEARING RESTRICTIONS: No  FALLS:  Has patient fallen in last 6 months?  No  LIVING ENVIRONMENT: Lives with: lives with their family and lives alone Lives in: House/apartment Stairs: No Has following equipment at home: None  OCCUPATION: n/a  PLOF: Independent with basic ADLs, Independent with household mobility without device, and Needs assistance with homemaking  PATIENT GOALS: resolution of back pain and tingling under R breast  NEXT MD VISIT: 10/10/2024 with referring provider  OBJECTIVE:  Note: Objective measures were completed at Evaluation unless otherwise noted.  DIAGNOSTIC  FINDINGS:   04/11/2024 T-SPINE CT  IMPRESSION: 1. No compelling findings of osseous metastatic disease in the thoracic spine. 2. 3 mm grade 1 degenerative anterolisthesis at T2-3. 3. Mild anterior interbody spurring at all levels between T2 and T9.  04/11/2024 L-SPINE CT  IMPRESSION:  IMPRESSION: 1. Lumbar spondylosis and degenerative disc disease, causing impingement at L3-4 and L4-5. 2. No findings of osseous metastatic disease in the lumbar spine.  PATIENT SURVEYS:  deferred  COGNITION: Overall cognitive status: History of cognitive impairments - at baseline  POSTURE: rounded shoulders, forward head, decreased lumbar lordosis, increased thoracic kyphosis, posterior pelvic tilt, and flexed trunk   PALPATION: Moderate to severe tenderness to palpation along R TS, medial and inferior border of R scapula, R QL, R glute med/max   CERVICAL ROM: deferred  Active ROM A/PROM (deg) eval  Flexion   Extension   Right lateral flexion   Left lateral flexion   Right rotation   Left rotation    (Blank rows = not tested)  UPPER EXTREMITY ROM: deferred  Active ROM Right eval Left eval  Shoulder flexion    Shoulder extension    Shoulder abduction    Shoulder adduction    Shoulder extension    Shoulder internal rotation    Shoulder external rotation    Elbow flexion    Elbow extension    Wrist flexion    Wrist extension    Wrist ulnar deviation    Wrist  radial deviation    Wrist pronation    Wrist supination     (Blank rows = not tested)  UPPER EXTREMITY MMT:  MMT Right eval Left eval  Shoulder flexion    Shoulder extension    Shoulder abduction    Shoulder adduction    Shoulder extension    Shoulder internal rotation    Shoulder external rotation    Middle trapezius    Lower trapezius    Elbow flexion    Elbow extension    Wrist flexion    Wrist extension    Wrist ulnar deviation    Wrist radial deviation    Wrist pronation    Wrist supination    Grip strength     (Blank rows = not tested)  LUMBAR ROM: deferred  Active  A/PROM  eval  Flexion   Extension   Right lateral flexion   Left lateral flexion   Right rotation   Left rotation    (Blank rows = not tested)    LOWER EXTREMITY ROM:   deferred  Active  Right eval Left eval  Hip flexion    Hip extension    Hip abduction    Hip adduction    Hip internal rotation    Hip external rotation    Knee flexion    Knee extension    Ankle dorsiflexion    Ankle plantarflexion    Ankle inversion    Ankle eversion     (Blank rows = not tested)   LOWER EXTREMITY MMT:  deferred  MMT Right eval Left eval  Hip flexion    Hip extension    Hip abduction    Hip adduction    Hip internal rotation    Hip external rotation    Knee flexion    Knee extension    Ankle dorsiflexion    Ankle plantarflexion    Ankle inversion    Ankle eversion     (Blank rows = not tested)   FUNCTIONAL TESTS:  deferred  TODAY'S TREATMENT:                                                                                                                                [facility] Adult PT Treatment:                                                DATE: 08/23/2024   Initial evaluation: see patient education and home exercise program as noted below   Manual Therapy:  STM and myofascial trigger point release along R TS paraspinals, LT, rhomboids, R QL and glute med     PATIENT  EDUCATION:  Education details: reviewed initial home exercise program; discussion of POC, prognosis and goals for skilled PT   Person educated: Patient and Child(ren) Education method: Explanation, Demonstration, and Handouts Education comprehension: verbalized understanding, returned demonstration, and needs further education  HOME EXERCISE PROGRAM: Access Code: [email or web address] URL: [email or web address] Date: 08/23/2024 Prepared by: Marko Molt  Exercises - Seated Cat Cow  - 3 x daily - 7 x weekly - 2 sets - 5 reps - 3 sec hold - Seated Diaphragmatic Breathing  - 3 x daily - 7 x weekly - 1 sets - 5 reps - 3 sec hold  Patient Education - Heat  ASSESSMENT:  CLINICAL IMPRESSION: Louna is a 84 y.o. female who was seen today for physical therapy evaluation and treatment for persistent Thoracic Pain with radicular symptoms and Low Back Pain. Assessment limited by severity of symptoms, additional objective measures to be assessed at next visit. At this time, she is demonstrating diminished postural awareness and muscle endurance, as well as moderate to severe tenderness to palpation along lower thoracic paraspinals, R lower trap, R periscpular mm, R QL, R glute mm. She has fair response to manual therapy today, with decreased radicular symptoms. She has related pain and difficulty with prolonged sitting, and has diminished sleep quality d/t pain. She requires skilled PT services at this time to address relevant deficits and improve overall function.     OBJECTIVE IMPAIRMENTS: decreased strength, postural dysfunction, and pain.   ACTIVITY LIMITATIONS: carrying, lifting, bending, sitting, standing, sleeping, bed mobility, and reach over head  PARTICIPATION LIMITATIONS: meal prep, cleaning, laundry, and community activity  PERSONAL FACTORS: Age, Time since onset of injury/illness/exacerbation, and 3+ comorbidities: Relevant PMHx includes protein-calorie malnutrition, cancer  associated pain, vascular dementia, Cholangiocarcinoma, osteoporosis, depression are also affecting patient's functional outcome.   REHAB POTENTIAL: Fair    CLINICAL DECISION MAKING: Stable/uncomplicated  EVALUATION COMPLEXITY: Low   GOALS: Goals reviewed with patient? YES  SHORT TERM GOALS: Target date: 09/20/2024    Patient will be independent with initial home program at least 3 days/week.  Baseline: provided at eval Goal Status: INITIAL   2.  Patient will demonstrate improved postural awareness for at least 15 minutes while  seated without need for cueing from PT.  Baseline: see objective measures Goal Status: INITIAL   3.  Patient will report ability to tolerate sitting for at least 1 hour, with minimal symptoms.  Baseline:  Goal status: INITIAL    LONG TERM GOALS: Target date: 10/18/2024   Patient will report improved overall functional ability with ODI score of 10/50 or less.  Baseline: deferred Goal Status: INITIAL    2.  Patient will demonstrate at least 4+/5 BIL periscapular MMT  Baseline: see objective measures  Goal status: INITIAL   3.  Patient will report ability to sleep at least 6 hours/night with minimal pain-related sleep disturbance Baseline: waking during the night to walk around d/t pain  Goal status: INITIAL       PLAN:  PT FREQUENCY: 1-2x/week  PT DURATION: 8 weeks  PLANNED INTERVENTIONS: [id number]- PT Re-evaluation, 97750- Physical Performance Testing, 97110-Therapeutic exercises, 97530- Therapeutic activity, 97112- Neuromuscular re-education, 97535- Self Care, [id number]- Manual therapy, [id number]- Electrical stimulation (unattended), Patient/Family education, Cryotherapy, and Moist heat  PLAN FOR NEXT SESSION: ODI, manual therapy, TS/LS mobility, scapular mob, periscap strengthening, postural mm endurance, review and update HEP as indicated; other interventions for pain modulation as appropriate; patient education    Marko Molt, PT, DPT   08/25/2024 8:21 AM        "

## 2024-08-24 ENCOUNTER — Encounter: Payer: Self-pay | Admitting: *Deleted

## 2024-08-27 ENCOUNTER — Inpatient Hospital Stay: Admitting: Oncology

## 2024-08-27 ENCOUNTER — Inpatient Hospital Stay

## 2024-08-28 ENCOUNTER — Ambulatory Visit (HOSPITAL_COMMUNITY)
Admission: RE | Admit: 2024-08-28 | Discharge: 2024-08-28 | Disposition: A | Discharge status: Home / Self Care | Source: Ambulatory Visit | Attending: Nurse Practitioner | Admitting: Nurse Practitioner

## 2024-08-28 DIAGNOSIS — C240 Malignant neoplasm of extrahepatic bile duct: Secondary | ICD-10-CM | POA: Insufficient documentation

## 2024-08-28 DIAGNOSIS — R112 Nausea with vomiting, unspecified: Secondary | ICD-10-CM | POA: Insufficient documentation

## 2024-08-29 ENCOUNTER — Ambulatory Visit: Payer: Self-pay | Admitting: Nurse Practitioner

## 2024-09-04 ENCOUNTER — Ambulatory Visit

## 2024-09-07 ENCOUNTER — Ambulatory Visit

## 2024-09-07 DIAGNOSIS — M5414 Radiculopathy, thoracic region: Secondary | ICD-10-CM

## 2024-09-07 DIAGNOSIS — M7918 Myalgia, other site: Secondary | ICD-10-CM

## 2024-09-07 DIAGNOSIS — M545 Low back pain, unspecified: Secondary | ICD-10-CM

## 2024-09-07 NOTE — Therapy (Signed)
 " OUTPATIENT PHYSICAL THERAPY TREATMENT NOTE   Patient Name: Caroline Snyder MRN: [id number] DOB:[birth year], 84 y.o., female Today's Date: 09/07/2024  END OF SESSION:   Past Medical History:  Diagnosis Date   Abnormal CT of the chest 11/24/2013   See CT chest 11/22/13  1. No CT evidence of pulmonary arterial embolic disease.   2. Enlarged lymph nodes in the right hilar and subcarinal region.   These may represent reactive lymph nodes, clinical correlation   recommended. There is otherwise no evidence of mediastinal or   parenchymal masses or nodules nor infiltrates.   3. 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     Depression 05/05/2007   External hemorrhoids without mention of complication 03/24/1999   GERD (gastroesophageal reflux disease) 08/02/1998   Hyperlipidemia 06/29/2006   Hypertensive emergency 09/13/2023   Memory loss 04/15/2003   Migraine without aura, without mention of intractable migraine without mention of status migrainosus 03/24/1999   Osteoporosis, unspecified 04/15/2004   Tear film insufficiency, unspecified 03/24/1999   Unspecified essential hypertension 11/22/2007   Past Surgical History:  Procedure Laterality Date   COLONOSCOPY  06-16-2007   Internal hemorrhoids,laxity of anal sphincter,polyp at 25cm form anal verge, tortuous sigmoid colon. Dr.Orr    ERCP N/A 12/21/2023   Procedure: ERCP, WITH INTERVENTION IF INDICATED;  Surgeon: Wilhelmenia Aloha Raddle., MD;  Location: [facility] ENDOSCOPY;  Service: Gastroenterology;  Laterality: N/A;   ERCP N/A 03/19/2024   Procedure: ERCP, WITH  INTERVENTION IF INDICATED;  Surgeon: Wilhelmenia Aloha Raddle., MD;  Location: [facility] ENDOSCOPY;  Service: Gastroenterology;  Laterality: N/A;   ESOPHAGOGASTRODUODENOSCOPY N/A 12/21/2023   Procedure: EGD (ESOPHAGOGASTRODUODENOSCOPY);  Surgeon: Wilhelmenia Aloha Raddle., MD;  Location: [facility] ENDOSCOPY;  Service: Gastroenterology;  Laterality: N/A;   EUS N/A 12/21/2023   Procedure: ULTRASOUND, UPPER GI TRACT, ENDOSCOPIC;  Surgeon: Wilhelmenia Aloha Raddle., MD;  Location: [facility] ENDOSCOPY;  Service: Gastroenterology;  Laterality: N/A;   EYE SURGERY Bilateral 2010   cataract Dr. Camillo   FINE NEEDLE ASPIRATION  12/21/2023   Procedure: FINE NEEDLE ASPIRATION;  Surgeon: Wilhelmenia Aloha Raddle., MD;  Location: [facility] ENDOSCOPY;  Service: Gastroenterology;;   VAGINAL HYSTERECTOMY  1980   Patient Active Problem List   Diagnosis Date Noted   Right-sided posterior chest wall pain 04/11/2024   Protein-calorie malnutrition, severe 03/26/2024   Cancer associated pain 03/26/2024   Hypertensive urgency 03/23/2024   Nausea vomiting and diarrhea 03/23/2024   Hypomagnesemia 03/18/2024   Hypophosphatemia 03/18/2024   Hypokalemia 03/18/2024   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 pylori infection 12/29/2023   Abdominal pain 12/29/2023   Gastritis with hemorrhage 12/21/2023   Common bile duct dilatation 12/21/2023   Transaminitis 12/18/2023   Nausea and vomiting 04/18/2019   High risk medication use 06/17/2017   Generalized muscle ache 01/17/2017   Insomnia 09/05/2016   Lumbago 10/21/2015   Paresthesia 05/13/2015   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  Osteoporosis 01/10/2014   Vaginal atrophy 01/10/2014   Cervicalgia 08/21/2013   Depression, recurrent    GERD (gastroesophageal reflux disease)     History of colonic polyps 04/27/2010   Essential hypertension 11/22/2007   Hyperlipidemia 06/29/2006    PCP: [name], NP  REFERRING PROVIDER: Trudy Duwaine BRAVO, NP  REFERRING DIAG:  [phone] (ICD-10-CM) - Chronic right-sided thoracic back pain M79.18 (ICD-10-CM) - Myofascial pain syndrome  THERAPY DIAG:  Radiculopathy, thoracic region  Lumbar pain  Myofascial pain syndrome  Rationale for Evaluation and Treatment: Rehabilitation  ONSET DATE: 8+ months  SUBJECTIVE:                                                                                                                                                                                                         SUBJECTIVE STATEMENT: Patient's son reports that she recently started Cymbalta , has been doing HEP.  EVAL: Patient reports to Pt with her son who helps to provide subjective information. She has been experiencing R sided TS and LS pain after >1 month hospital stay. Son reports that when the pain comes on, she can get nauseous.    PERTINENT HISTORY:  Relevant PMHx includes protein-calorie malnutrition, cancer associated pain, vascular dementia, Cholangiocarcinoma, osteoporosis, depression  PAIN:  Are you having pain? Yes: NPRS scale: 10/10 worst Pain location: R sided thoracic, periscapular pain with tingling under R breast Pain description: tingling Aggravating factors: unable to identify  Relieving factors: prednisone , tylenol   PRECAUTIONS: Other: osteoporosis with hx of malnutrition  RED FLAGS: None     WEIGHT BEARING RESTRICTIONS: No  FALLS:  Has patient fallen in last 6 months? No  LIVING ENVIRONMENT: Lives with: lives with their family and lives alone Lives in: House/apartment Stairs: No Has following equipment at home: None  OCCUPATION: n/a  PLOF: Independent with basic ADLs, Independent with household mobility without device, and Needs assistance with homemaking  PATIENT GOALS:  resolution of back pain and tingling under R breast  NEXT MD VISIT: 10/10/2024 with referring provider  OBJECTIVE:  Note: Objective measures were completed at Evaluation unless otherwise noted.  DIAGNOSTIC FINDINGS:   04/11/2024 T-SPINE CT  IMPRESSION: 1. No compelling findings of osseous metastatic disease in the thoracic spine. 2. 3 mm grade 1 degenerative anterolisthesis at T2-3. 3. Mild anterior interbody spurring at all levels between T2 and T9.  04/11/2024 L-SPINE CT  IMPRESSION:  IMPRESSION: 1. Lumbar spondylosis and degenerative disc disease, causing impingement at L3-4 and L4-5. 2. No findings of osseous metastatic disease in the lumbar spine.  PATIENT SURVEYS:  deferred  COGNITION: Overall cognitive status: History of cognitive impairments - at baseline  POSTURE: rounded shoulders, forward head, decreased lumbar lordosis, increased thoracic kyphosis, posterior pelvic tilt, and flexed trunk   PALPATION: Moderate to severe tenderness to palpation along R TS, medial and inferior border of R scapula, R QL, R glute med/max   CERVICAL ROM: deferred  Active ROM A/PROM (deg) eval  Flexion   Extension   Right lateral flexion   Left lateral flexion   Right rotation   Left rotation    (Blank rows = not tested)  UPPER EXTREMITY ROM: deferred  Active ROM Right eval Left eval  Shoulder flexion    Shoulder extension    Shoulder abduction    Shoulder adduction    Shoulder extension    Shoulder internal rotation    Shoulder external rotation    Elbow flexion    Elbow extension    Wrist flexion    Wrist extension    Wrist ulnar deviation    Wrist radial deviation    Wrist pronation    Wrist supination     (Blank rows = not tested)  UPPER EXTREMITY MMT:  MMT Right eval Left eval  Shoulder flexion    Shoulder extension    Shoulder abduction    Shoulder adduction    Shoulder extension    Shoulder internal rotation    Shoulder external rotation     Middle trapezius    Lower trapezius    Elbow flexion    Elbow extension    Wrist flexion    Wrist extension    Wrist ulnar deviation    Wrist radial deviation    Wrist pronation    Wrist supination    Grip strength     (Blank rows = not tested)  LUMBAR ROM: deferred  Active  A/PROM  eval  Flexion   Extension   Right lateral flexion   Left lateral flexion   Right rotation   Left rotation    (Blank rows = not tested)    LOWER EXTREMITY ROM:   deferred  Active  Right eval Left eval  Hip flexion    Hip extension    Hip abduction    Hip adduction    Hip internal rotation    Hip external rotation    Knee flexion    Knee extension    Ankle dorsiflexion    Ankle plantarflexion    Ankle inversion    Ankle eversion     (Blank rows = not tested)   LOWER EXTREMITY MMT:  deferred  MMT Right eval Left eval  Hip flexion    Hip extension    Hip abduction    Hip adduction    Hip internal rotation    Hip external rotation    Knee flexion    Knee extension    Ankle dorsiflexion    Ankle plantarflexion    Ankle inversion    Ankle eversion     (Blank rows = not tested)   FUNCTIONAL TESTS:  deferred   TODAY'S TREATMENT:       [facility] Adult PT Treatment:                                                DATE: 09/07/24 Therapeutic Exercise: Nustep level 4 x 5 mins Seated pball roll outs fwd 2x10 LTR x10  BIL Bridges 2x10 Supine SKTC 2x30 BIL Neuromuscular re-ed: Standing rows RTB 2x10 Standing shoulder extension YTB 2x10 Seated horizontal abduction RTB 2x10 Seated scapular retraction 2x10 Supine sciatic nerve glide x20 BIL Therapeutic Activity: Administration of ODI - filled out by son Standing hip abduction/extension 2x10 ea BIL   [facility] Adult PT Treatment:                                                DATE: 08/23/2024   Initial evaluation: see patient education and home exercise program as noted below   Manual Therapy:  STM and myofascial trigger point  release along R TS paraspinals, LT, rhomboids, R QL and glute med     PATIENT EDUCATION:  Education details: reviewed initial home exercise program; discussion of POC, prognosis and goals for skilled PT   Person educated: Patient and Child(ren) Education method: Explanation, Demonstration, and Handouts Education comprehension: verbalized understanding, returned demonstration, and needs further education  HOME EXERCISE PROGRAM: Access Code: [email or web address] URL: [email or web address] Date: 09/07/2024 Prepared by: Corean Pouch  Exercises - Seated Cat Cow  - 3 x daily - 7 x weekly - 2 sets - 5 reps - 3 sec hold - Seated Diaphragmatic Breathing  - 3 x daily - 7 x weekly - 1 sets - 5 reps - 3 sec hold - Supine Bridge  - 1 x daily - 7 x weekly - 2 sets - 10 reps - Supine Lower Trunk Rotation  - 1 x daily - 7 x weekly - 2 sets - 10 reps - Standing Hip Abduction with Counter Support  - 1 x daily - 7 x weekly - 2 sets - 10 reps  Patient Education - Heat  ASSESSMENT:  CLINICAL IMPRESSION: Patient presents to first follow up PT session reporting improvements in her lower back pain. Session today focused on proximal hip and periscapular strengthening as well as TS and LS mobility. She tolerates all exercises well and does not endorse any pain throughout session. Updated HEP with patient and son demonstrating understanding of exercises.Patient continues to benefit from skilled PT services and should be progressed as able to improve functional independence.   EVAL: Yasuko is a 84 y.o. female who was seen today for physical therapy evaluation and treatment for persistent Thoracic Pain with radicular symptoms and Low Back Pain. Assessment limited by severity of symptoms, additional objective measures to be assessed at next visit. At this time, she is demonstrating diminished postural awareness and muscle endurance, as well as moderate to severe tenderness to palpation along lower  thoracic paraspinals, R lower trap, R periscpular mm, R QL, R glute mm. She has fair response to manual therapy today, with decreased radicular symptoms. She has related pain and difficulty with prolonged sitting, and has diminished sleep quality d/t pain. She requires skilled PT services at this time to address relevant deficits and improve overall function.     OBJECTIVE IMPAIRMENTS: decreased strength, postural dysfunction, and pain.   ACTIVITY LIMITATIONS: carrying, lifting, bending, sitting, standing, sleeping, bed mobility, and reach over head  PARTICIPATION LIMITATIONS: meal prep, cleaning, laundry, and community activity  PERSONAL FACTORS: Age, Time since onset of injury/illness/exacerbation, and 3+ comorbidities: Relevant PMHx includes protein-calorie malnutrition, cancer associated pain, vascular dementia, Cholangiocarcinoma, osteoporosis, depression are also affecting patient's functional outcome.   REHAB POTENTIAL: Fair    CLINICAL DECISION MAKING: Stable/uncomplicated  EVALUATION  COMPLEXITY: Low   GOALS: Goals reviewed with patient? YES  SHORT TERM GOALS: Target date: 09/20/2024   Patient will be independent with initial home program at least 3 days/week.  Baseline: provided at eval Goal Status: INITIAL   2.  Patient will demonstrate improved postural awareness for at least 15 minutes while seated without need for cueing from PT.  Baseline: see objective measures Goal Status: INITIAL   3.  Patient will report ability to tolerate sitting for at least 1 hour, with minimal symptoms.  Baseline:  Goal status: INITIAL    LONG TERM GOALS: Target date: 10/18/2024   Patient will report improved overall functional ability with ODI score of 10/50 or less.  Baseline: deferred Goal Status: INITIAL    2.  Patient will demonstrate at least 4+/5 BIL periscapular MMT  Baseline: see objective measures  Goal status: INITIAL   3.  Patient will report ability to sleep at least  6 hours/night with minimal pain-related sleep disturbance Baseline: waking during the night to walk around d/t pain  Goal status: INITIAL    PLAN:  PT FREQUENCY: 1-2x/week  PT DURATION: 8 weeks  PLANNED INTERVENTIONS: [id number]- PT Re-evaluation, 97750- Physical Performance Testing, 97110-Therapeutic exercises, 97530- Therapeutic activity, 97112- Neuromuscular re-education, 97535- Self Care, [id number]- Manual therapy, [id number]- Electrical stimulation (unattended), Patient/Family education, Cryotherapy, and Moist heat  PLAN FOR NEXT SESSION: ODI, manual therapy, TS/LS mobility, scapular mob, periscap strengthening, postural mm endurance, review and update HEP as indicated; other interventions for pain modulation as appropriate; patient education    Corean Pouch PTA  09/07/2024 10:50 AM   "

## 2024-09-11 ENCOUNTER — Ambulatory Visit

## 2024-09-12 ENCOUNTER — Ambulatory Visit: Admitting: Physical Medicine and Rehabilitation

## 2024-09-14 ENCOUNTER — Ambulatory Visit

## 2024-09-17 ENCOUNTER — Ambulatory Visit

## 2024-09-18 ENCOUNTER — Other Ambulatory Visit (HOSPITAL_COMMUNITY)

## 2024-09-18 ENCOUNTER — Ambulatory Visit

## 2024-09-21 ENCOUNTER — Ambulatory Visit

## 2024-09-24 ENCOUNTER — Ambulatory Visit: Admitting: Nurse Practitioner

## 2024-09-25 ENCOUNTER — Ambulatory Visit

## 2024-09-28 ENCOUNTER — Ambulatory Visit

## 2024-10-08 ENCOUNTER — Inpatient Hospital Stay: Admitting: Oncology

## 2024-10-08 ENCOUNTER — Inpatient Hospital Stay

## 2024-10-10 ENCOUNTER — Ambulatory Visit: Admitting: Physical Medicine and Rehabilitation

## 2024-12-07 ENCOUNTER — Ambulatory Visit: Admitting: Nurse Practitioner

## 2025-01-09 ENCOUNTER — Institutional Professional Consult (permissible substitution): Admitting: Diagnostic Neuroimaging
